# Patient Record
Sex: Female | Born: 1950 | Race: White | Hispanic: No | Marital: Married | Smoking: Never smoker
Health system: Southern US, Community
[De-identification: ages and names within clinical notes are randomized; demographics above are authoritative.]

## PROBLEM LIST (undated history)

## (undated) DIAGNOSIS — E785 Hyperlipidemia, unspecified: Secondary | ICD-10-CM

## (undated) DIAGNOSIS — K3189 Other diseases of stomach and duodenum: Secondary | ICD-10-CM

## (undated) DIAGNOSIS — E538 Deficiency of other specified B group vitamins: Principal | ICD-10-CM

## (undated) DIAGNOSIS — K5730 Diverticulosis of large intestine without perforation or abscess without bleeding: Secondary | ICD-10-CM

## (undated) DIAGNOSIS — K449 Diaphragmatic hernia without obstruction or gangrene: Secondary | ICD-10-CM

## (undated) DIAGNOSIS — K2270 Barrett's esophagus without dysplasia: Secondary | ICD-10-CM

## (undated) DIAGNOSIS — E119 Type 2 diabetes mellitus without complications: Secondary | ICD-10-CM

## (undated) DIAGNOSIS — I8500 Esophageal varices without bleeding: Secondary | ICD-10-CM

## (undated) DIAGNOSIS — D509 Iron deficiency anemia, unspecified: Secondary | ICD-10-CM

## (undated) DIAGNOSIS — K766 Portal hypertension: Secondary | ICD-10-CM

## (undated) DIAGNOSIS — K649 Unspecified hemorrhoids: Secondary | ICD-10-CM

## (undated) DIAGNOSIS — K7460 Unspecified cirrhosis of liver: Secondary | ICD-10-CM

## (undated) DIAGNOSIS — K219 Gastro-esophageal reflux disease without esophagitis: Secondary | ICD-10-CM

## (undated) DIAGNOSIS — I10 Essential (primary) hypertension: Secondary | ICD-10-CM

## (undated) DIAGNOSIS — D369 Benign neoplasm, unspecified site: Secondary | ICD-10-CM

## (undated) HISTORY — DX: Essential (primary) hypertension: I10

## (undated) HISTORY — DX: Benign neoplasm, unspecified site: D36.9

## (undated) HISTORY — DX: Portal hypertension: K76.6

## (undated) HISTORY — DX: Gastro-esophageal reflux disease without esophagitis: K21.9

## (undated) HISTORY — DX: Unspecified hemorrhoids: K64.9

## (undated) HISTORY — DX: Esophageal varices without bleeding: I85.00

## (undated) HISTORY — DX: Hyperlipidemia, unspecified: E78.5

## (undated) HISTORY — DX: Unspecified cirrhosis of liver: K74.60

## (undated) HISTORY — PX: ABDOMINAL HYSTERECTOMY: SHX81

## (undated) HISTORY — DX: Type 2 diabetes mellitus without complications: E11.9

## (undated) HISTORY — DX: Iron deficiency anemia, unspecified: D50.9

## (undated) HISTORY — DX: Diaphragmatic hernia without obstruction or gangrene: K44.9

## (undated) HISTORY — DX: Other diseases of stomach and duodenum: K31.89

## (undated) HISTORY — DX: Deficiency of other specified B group vitamins: E53.8

## (undated) HISTORY — DX: Barrett's esophagus without dysplasia: K22.70

## (undated) HISTORY — DX: Diverticulosis of large intestine without perforation or abscess without bleeding: K57.30

---

## 1998-12-04 ENCOUNTER — Other Ambulatory Visit: Admission: RE | Admit: 1998-12-04 | Discharge: 1998-12-04 | Payer: Self-pay | Admitting: Obstetrics & Gynecology

## 2000-02-13 ENCOUNTER — Other Ambulatory Visit: Admission: RE | Admit: 2000-02-13 | Discharge: 2000-02-13 | Payer: Self-pay | Admitting: Obstetrics and Gynecology

## 2001-04-06 ENCOUNTER — Other Ambulatory Visit: Admission: RE | Admit: 2001-04-06 | Discharge: 2001-04-06 | Payer: Self-pay | Admitting: Obstetrics and Gynecology

## 2002-06-23 ENCOUNTER — Other Ambulatory Visit: Admission: RE | Admit: 2002-06-23 | Discharge: 2002-06-23 | Payer: Self-pay | Admitting: Obstetrics and Gynecology

## 2002-07-15 HISTORY — PX: COLONOSCOPY: SHX174

## 2002-08-01 ENCOUNTER — Ambulatory Visit (HOSPITAL_COMMUNITY): Admission: RE | Admit: 2002-08-01 | Discharge: 2002-08-01 | Payer: Self-pay | Admitting: Internal Medicine

## 2006-03-10 ENCOUNTER — Ambulatory Visit: Payer: Self-pay | Admitting: Internal Medicine

## 2006-03-14 HISTORY — PX: ESOPHAGOGASTRODUODENOSCOPY: SHX1529

## 2006-03-14 HISTORY — PX: COLONOSCOPY: SHX174

## 2006-04-12 ENCOUNTER — Ambulatory Visit (HOSPITAL_COMMUNITY): Admission: RE | Admit: 2006-04-12 | Discharge: 2006-04-12 | Payer: Self-pay | Admitting: Internal Medicine

## 2006-04-12 ENCOUNTER — Ambulatory Visit: Payer: Self-pay | Admitting: Internal Medicine

## 2006-04-12 ENCOUNTER — Encounter (INDEPENDENT_AMBULATORY_CARE_PROVIDER_SITE_OTHER): Payer: Self-pay | Admitting: *Deleted

## 2006-08-14 HISTORY — PX: ESOPHAGOGASTRODUODENOSCOPY: SHX1529

## 2006-08-20 ENCOUNTER — Encounter (INDEPENDENT_AMBULATORY_CARE_PROVIDER_SITE_OTHER): Payer: Self-pay | Admitting: *Deleted

## 2006-08-20 ENCOUNTER — Ambulatory Visit: Payer: Self-pay | Admitting: Internal Medicine

## 2006-08-20 ENCOUNTER — Ambulatory Visit (HOSPITAL_COMMUNITY): Admission: RE | Admit: 2006-08-20 | Discharge: 2006-08-20 | Payer: Self-pay | Admitting: Internal Medicine

## 2006-09-14 HISTORY — PX: HEMORRHOID SURGERY: SHX153

## 2009-08-30 ENCOUNTER — Ambulatory Visit: Payer: Self-pay | Admitting: Internal Medicine

## 2009-08-30 DIAGNOSIS — E782 Mixed hyperlipidemia: Secondary | ICD-10-CM | POA: Insufficient documentation

## 2009-08-30 DIAGNOSIS — E119 Type 2 diabetes mellitus without complications: Secondary | ICD-10-CM | POA: Insufficient documentation

## 2009-08-30 DIAGNOSIS — I10 Essential (primary) hypertension: Secondary | ICD-10-CM | POA: Insufficient documentation

## 2009-08-30 DIAGNOSIS — K649 Unspecified hemorrhoids: Secondary | ICD-10-CM | POA: Insufficient documentation

## 2009-08-30 DIAGNOSIS — IMO0002 Reserved for concepts with insufficient information to code with codable children: Secondary | ICD-10-CM | POA: Insufficient documentation

## 2009-08-30 DIAGNOSIS — K219 Gastro-esophageal reflux disease without esophagitis: Secondary | ICD-10-CM | POA: Insufficient documentation

## 2009-08-30 DIAGNOSIS — D509 Iron deficiency anemia, unspecified: Secondary | ICD-10-CM | POA: Insufficient documentation

## 2009-08-30 DIAGNOSIS — E785 Hyperlipidemia, unspecified: Secondary | ICD-10-CM | POA: Insufficient documentation

## 2009-08-30 DIAGNOSIS — K2270 Barrett's esophagus without dysplasia: Secondary | ICD-10-CM | POA: Insufficient documentation

## 2009-09-04 ENCOUNTER — Encounter: Payer: Self-pay | Admitting: Internal Medicine

## 2009-09-23 ENCOUNTER — Ambulatory Visit (HOSPITAL_COMMUNITY): Admission: RE | Admit: 2009-09-23 | Discharge: 2009-09-23 | Payer: Self-pay | Admitting: Internal Medicine

## 2009-09-23 ENCOUNTER — Ambulatory Visit: Payer: Self-pay | Admitting: Internal Medicine

## 2009-09-23 HISTORY — PX: COLONOSCOPY: SHX174

## 2009-09-23 HISTORY — PX: ESOPHAGOGASTRODUODENOSCOPY: SHX1529

## 2009-09-25 ENCOUNTER — Encounter: Payer: Self-pay | Admitting: Internal Medicine

## 2009-09-27 ENCOUNTER — Ambulatory Visit (HOSPITAL_COMMUNITY): Admission: RE | Admit: 2009-09-27 | Discharge: 2009-09-27 | Payer: Self-pay | Admitting: Internal Medicine

## 2009-10-01 ENCOUNTER — Encounter (INDEPENDENT_AMBULATORY_CARE_PROVIDER_SITE_OTHER): Payer: Self-pay

## 2009-10-07 ENCOUNTER — Ambulatory Visit (HOSPITAL_COMMUNITY): Admission: RE | Admit: 2009-10-07 | Discharge: 2009-10-07 | Payer: Self-pay | Admitting: Internal Medicine

## 2009-10-07 ENCOUNTER — Encounter: Payer: Self-pay | Admitting: Internal Medicine

## 2009-10-11 ENCOUNTER — Ambulatory Visit (HOSPITAL_COMMUNITY): Admission: RE | Admit: 2009-10-11 | Discharge: 2009-10-11 | Payer: Self-pay | Admitting: Internal Medicine

## 2009-10-14 ENCOUNTER — Telehealth (INDEPENDENT_AMBULATORY_CARE_PROVIDER_SITE_OTHER): Payer: Self-pay

## 2009-10-15 HISTORY — PX: OTHER SURGICAL HISTORY: SHX169

## 2009-10-16 ENCOUNTER — Encounter: Payer: Self-pay | Admitting: Internal Medicine

## 2009-10-23 ENCOUNTER — Ambulatory Visit: Payer: Self-pay | Admitting: Internal Medicine

## 2009-10-23 DIAGNOSIS — K7460 Unspecified cirrhosis of liver: Secondary | ICD-10-CM | POA: Insufficient documentation

## 2009-10-29 ENCOUNTER — Ambulatory Visit: Payer: Self-pay | Admitting: Internal Medicine

## 2009-10-29 ENCOUNTER — Ambulatory Visit (HOSPITAL_COMMUNITY): Admission: RE | Admit: 2009-10-29 | Discharge: 2009-10-29 | Payer: Self-pay | Admitting: Internal Medicine

## 2009-10-30 ENCOUNTER — Encounter: Payer: Self-pay | Admitting: Internal Medicine

## 2010-10-14 NOTE — Letter (Signed)
Summary: Patient Notice, Colon Biopsy Results  [facility]  [address]   [place], [place] [address]   Phone: [phone]  Fax: [phone]       September 25, 2009   Alisha [name] [place], [place]  [address] [birth year]    Dear [name],  I am pleased to inform you that the biopsies taken during your recent colonoscopy did not show any evidence of cancer upon pathologic examination.  Additional information/recommendations:  You should have a repeat colonoscopy examination  in 5 years.  Please call us if you are having persistent problems or have questions about your condition that have not been fully answered at this time.  Sincerely,    R. Garfield Cornea MD  [facility] Ph: [phone]    Fax: [phone]   Appended Document: Patient Notice, Colon Biopsy Results mailed pt letter

## 2010-10-14 NOTE — Miscellaneous (Signed)
Summary: labs from [facility]  Clinical Lists Changes L-Alpha-Fetoprotein (AFP), Tumor Marker - STATUS: Final                                            Perform Date: 94WNI62 09:00  Ordered By: Gala Romney MD , Cristopher Estimable           Ordered Date: 10Jan11 08:51                                       Last Updated Date: 70JJK09 23:13  Facility: [facility]                               Department: GENL  Accession #: [id number]                   USN:       [id number]  Findings  Result Name                              Result     Abnl   Normal Range     Units      Perf. Loc.  Alpha Fetoprotein-Tumor Marker        2.5               0.0-8.0          ng/mL    Oversized comment, see footnote  1  Footnotes  1. (NOTE)     The Advia Centaur AFP immunoassay method is used.  Results obtained     with different assay methods or kits cannot be used interchangeably.     AFP is a valuable aid in the management of nonseminomatous testicular     cancer patients when used in conjunction with information available     from the clinical evaluation and other diagnostic procedures.     Increased serum AFP concentrations have also been observed in ataxia     telangiectasia, hereditary tyrosinemia, primary hepatocellular     carcinoma, teratocarcinoma, gastrointestinal tract cancers with and     without liver metastases, and in benign hepatic conditions such as     acute viral hepatitis, chronic active hepatitis, and cirrhosis.  This     result cannot be interpreted as absolute evidence of the presence or     absence of malignant disease.  This result is not interpretable in     pregnant females.     Performed at [facility] RESULT STATUS : F  External IF Update Timestamp : 2009-09-23:23:10:00.000000    L-Hepatitis C Antibody - STATUS: Final                                            Perform Date: 85IDP82 09:00  Ordered By: Gala Romney MD , Cristopher Estimable           Ordered Date: [place]  08:47  Last Updated Date: 10Jan11 21:49  Facility: [facility]                               Department: GENL  Accession #: [id number]                  USN:       [id number]  Findings  Result Name                              Result     Abnl   Normal Range     Units      Perf. Loc.  Hepatitis C Antibody                     NEGATIVE          NEG    Performed at [facility]  Additional Information  HL7 RESULT STATUS : F  External IF Update Timestamp : 2009-09-23:21:46:00.000000   L-Hepatitis B Surface Antigen - STATUS: Final                                            Perform Date: 44IHK74 09:00  Ordered By: Gala Romney MD , Cristopher Estimable           Ordered Date: 10Jan11 08:47                                       Last Updated Date: 25ZDG38 21:49  Facility: [facility]                               Department: GENL  Accession #: [id number]                   USN:       [id number]  Findings  Result Name                              Result     Abnl   Normal Range     Units      Perf. Loc.  Hepatitis B Surface Antigen              NEGATIVE          NEG    Performed at [facility]  Additional Information  HL7 RESULT STATUS : F  External IF Update Timestamp : 2009-09-23:21:45:00.000000   L-Hepatic Function Panel (HFP / LFT) - STATUS: Final                                            Perform Date: 23FTD32 09:00  Ordered By: Gala Romney MD , Cristopher Estimable           Ordered Date: 10Jan11 08:46                                       Last Updated Date:  69VXY80 09:39  Facility: [facility]                               Department: [place]  Accession #: [id number]                    USN:       [id number]  Additional Information  HL7 RESULT STATUS : F  External IF Update Timestamp : 2009-09-23:09:36:00.000000  Result Name                              Result     Abnl   Normal Range     Units      Perf. Loc.  Bilirubin, Total                                 1.7        h      0.3-1.2          mg/dL  Bilirubin, Direct                              0.2               0.0-0.3          mg/dL  Indirect Bilirubin                             1.5        h      0.3-0.9          mg/dL  Alkaline Phosphatase                     59                39-117           U/L  SGOT (AST)                                  24                0-37             U/L  SGPT (ALT)                                   20                0-35             U/L  Total  Protein                                6.6               6.0-8.3          g/dL  Albumin-Blood                              3.7  3.5-5.2          g/dL  Additional Information  HL7 RESULT STATUS : F  External IF Update Timestamp : 2009-09-23:09:36:00.000000   L-Creatinine-Blood - STATUS: Final                                            Perform Date: 51WCH85 09:00  Ordered By: Gala Romney MD , Cristopher Estimable           Ordered Date: 10Jan11 08:51                                       Last Updated Date: 10Jan11 09:39  Facility: [facility]                               Department: GENL  Accession #: [id number]                  USN:       [id number]  Findings  Result Name                              Result     Abnl   Normal Range     Units      Perf. Loc.  Creatinine                                     0.76              0.4-1.2          mg/dL  GFR, Est Non African American       >60               >60              mL/min  GFR, Est African American             >60               >60              mL/min    Oversized comment, see footnote  1  Footnotes  1. The eGFR has been calculated     using the MDRD equation.     This calculation has not been     validated in all clinical     situations.     eGFR's persistently     <60 mL/min signify     possible Chronic Kidney Disease.  Additional Information  HL7 RESULT STATUS : F  External IF Update Timestamp : 2009-09-23:09:36:00.000000    L-PT  (Prothrombin Time or Protime) w/INR - STATUS: Final                                            Perform Date: 09TOI71 09:00  Ordered By: Gala Romney MD , Cristopher Estimable           Ordered Date: 10Jan11 08:46  Last Updated Date: 10Jan11 09:27  Facility: [facility]                               Department: GENL  Accession #: [id number]                     USN:       [id number]  Findings  Result Name                              Result     Abnl   Normal Range     Units      Perf. Loc.  Protime ( Prothrombin Time)          15.8       h      11.6-15.2        seconds  INR                                               1.27              0.00-1.49  Additional Information  HL7 RESULT STATUS : F  External IF Update Timestamp : 2009-09-23:09:24:00.000000  Appended Document: labs from [facility] cirrhosis on CT and spot in lung for which ct of chest needed asap; labs ok except bilirubin up a little ; no sign of viral hepatitis of liver tumor; she needs a f/u w extender in next 2-3 weeks to inv estigate further.  Need Chest CT ASAP  Appended Document: labs from [facility] Tried to call patient.  Not at home.  Advised to call her around 1:30pm tomorrow.    Appended Document: labs from [facility] Discussed findings with patient.  Answered questions about cirrhosis.  She will call Rosendo Gros back to schedule Chest CT as per RMR order.  See above.  Need OV nonurgent for f/u cirrhosis with Dr. Gala Romney.  Patient request with him.  Appended Document: labs from [facility] Called pt, No answer, LMOM

## 2010-10-14 NOTE — Assessment & Plan Note (Signed)
Summary: FU WITH RMR,CIRHOSIS/SS   Visit Type:  Follow-up Visit Primary Care Provider:  vyas  Chief Complaint:  follow up- doing ok.  History of Present Illness: Pleasant 60 year old lady underwent recent workup for iron deficiency anemia and Hemoccult-positive stool with EGD and colonoscopy. She was found to have esophageal varices. History of Barrett's esophagus biopsies/  negative for dysplasia most recently. CT Scan demonstrated intra-abdominal varices and cirrhotic-appearing liver with a large spleen. No evidence of a hepatic mass although she did a middle lobe lung lesion which was concerning. PET Scan negative. No tobacco history. No alcohol history. No prior history of chronic liver disease or cirrhosis. Long-standing iron deficiency anemia going back a good 20 years originally diagnosed by Dr. Jerene Pitch.  She is not yet been screened for celiac disease. She has not had her small bowel imaged. She needs further workup as to the etiology of her cirrhosis.  Clinically, she has not had any GI bleeding aside from a positive Hemoccult obtained during her physical with her gynecologist.  Current Problems (verified): 1)  Cirrhosis  (ICD-571.5) 2)  Gerd  (ICD-530.81) 3)  Anemia, Iron Deficiency, Chronic  (ICD-280.9) 4)  Barretts Esophagus  (ICD-530.85) 5)  Diabetes Mellitus, Type II  (ICD-250.00) 6)  Hemorrhoids  (ICD-455.6) 7)  Hypertension  (ICD-401.9) 8)  Hyperlipidemia  (ICD-272.4)  Current Medications (verified): 1)  Glucophage 1000 Mg Tabs (Metformin Hcl) .... Two Times A Day 2)  Glipizide 10 Mg Tabs (Glipizide) .... Two Times A Day 3)  Nexium 40 Mg Cpdr (Esomeprazole Magnesium) .... Once Daily 4)  Lisinopril 10 Mg Tabs (Lisinopril) .... Once Daily 5)  Premarin 0.3 Mg Tabs (Estrogens Conjugated) .... Every Other Day 6)  Victoza 18 Mg/25m Soln (Liraglutide) .... 1.8 Units Once Daily  Allergies (verified): No Known Drug Allergies  Past History:  Past Surgical History: Last updated:  08/30/2009 HYSTERECTOMY CESAREAN SECTIONS X 2 Hemorrhoidectomy, 2008  Family History: Last updated: 08/30/2009 No FH of CRC, liver dz, chronic GI illnesses.  Social History: Last updated: 08/30/2009 Married. 2 grown children. 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  No tob, alcohol, drug use.  Past Medical History: Anemia, chronic IDA  Barretts Esophagus, dx 2003  EGD 12/07, Barrett's esophagus, bx no dysplasia.   EGD 7/07, Barrett's esophagus, bx focal atypia c/w low grade dysplasia, SB bx negative for Celiac. TCS 7/07, left-sided diverticula, splenic flexure tubular adenoma (630m. 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 Diabetes Hyperlipidemia Hypertension Cirrhosis  Vital Signs:  Patient profile:   [age]ear old female Height:      61 inches Weight:      184 pounds BMI:     34.89 Temp:     97.7 degrees F oral Pulse rate:   88 / minute BP sitting:   128 / 78  (right arm) Cuff size:   regular  Vitals Entered By: JuBurnadette PeterPN (February  9, 209629:30 PM)  Physical Exam  General:  alert conversant lady in no acute distress. She is accompanied by her husband.  She does have somewhat of a sallow appearance. Eyes:  there is no scleral icterus. Conjunctiva are pink Lungs:  clear to auscultation Heart:  regular rate and rhythm without murmur gallop rub Abdomen:  obese positive bowel sounds soft nontender no shifting dullness or fluid wave. I do believe I can blotthert  spleen; liver edge is indistinct at the right costal margin  Impression & Recommendations: Impression: Long-standing iron deficiency anemia/ Hemoccult positive in a  setting of cirrhosis.  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 However,  other possible etiologies need to be explored.  I certainly doubt iron overload. She needs imaging of her small bowel to wrap the evaluation of the IDA and Hemoccult-positive stool.  Recommendations: Video capsule study of  the small bowel. Risks, benefits, limitations reviewed with patient and her husband. They are agreeable. Will check a serum Celiac panel.  we'll go ahead and do a serological workup for liver disease. We'll also obtain autoimmune markers and a serum ceruloplasmin and a baseline serum alpha-fetoprotein. I briefly discussed the possibility of evaluation at a transplant center later in the year.  She will likely need  vaccination against hepatitis A and B. Further recommendations to follow  Problem # 2:  I briefly  Other Orders: T-CBC w/Diff (55217-47159) T-PT (Prothrombin Time) (22000) T-AFP Tumor Markers (53967-28979) T-Comprehensive Metabolic Panel (15041-36438) T-Hepatitis A Antibody (37793-96886) T-Hepatitis C Antibody (48472-07218) T-Hepatitis B Surface Antigen (28833-74451) T-Hepatitis B Surface Antibody (46047-99872) T-ANA (15872-76184) T-Anti SMA (85927-63943) T-Ceruloplasmin (20037-94446) T-Celiac Disease Ab Evaluation (8002) T-Immunoglobulins, Quantitative (19012) TLB-Bilirubin, Direct (82248-DBIL)  Appended Document: Orders Update-charge    Clinical Lists Changes  Orders: Added new Service order of Est. Patient Level V (22411) - Signed

## 2010-10-14 NOTE — Letter (Signed)
Summary: Patient Notice, Endo Biopsy Results  [facility]  [address]   [place], [place] [address]   Phone: [phone]  Fax: [phone]       September 25, 2009   STEPHAN DRAUGHN [place], [place]  [address] 1951/03/30    Dear Alisha [name],  I am pleased to inform you that the biopsies taken during your recent endoscopic examination did not show any evidence of cancer upon pathologic examination.  Additional information/recommendations:  Please call [phone] to schedule a return visit to review your condition.  Stay on Nexium daily.  You should have a repeat endoscopic examination in 3 years.  Please call us if you are having persistent problems or have questions about your condition that have not been fully answered at this time.  Sincerely,    R. Garfield Cornea MD  [facility] Ph: [phone]   Fax: [phone]   Appended Document: Patient Notice, Endo Biopsy Results mailed pt letter

## 2010-10-14 NOTE — Letter (Signed)
Summary: CAPSUL STUDY ORDER  CAPSUL STUDY ORDER   Imported By: Sofie Rower 10/23/2009 15:31:27  _____________________________________________________________________  External Attachment:    Type:   Image     Comment:   External Document

## 2010-10-14 NOTE — Letter (Signed)
Summary: PET SCAN ORDER  PET SCAN ORDER   Imported By: Stephan Minister 10/16/2009 12:08:16  _____________________________________________________________________  External Attachment:    Type:   Image     Comment:   External Document

## 2010-10-14 NOTE — Letter (Signed)
Summary: AUTHORIZATION FOR CAPSULE  AUTHORIZATION FOR CAPSULE   Imported By: Sofie Rower 10/30/2009 12:16:59  _____________________________________________________________________  External Attachment:    Type:   Image     Comment:   External Document

## 2010-10-14 NOTE — Letter (Signed)
Summary: CT CHEST ORDER  CT CHEST ORDER   Imported By: Sofie Rower 10/07/2009 12:45:07  _____________________________________________________________________  External Attachment:    Type:   Image     Comment:   External Document

## 2010-10-14 NOTE — Progress Notes (Signed)
Summary: Pt called for results of PET SCAN  Phone Note Call from Patient   Caller: Patient Summary of Call: Pt requests results of PET SCAN. I told her Dr. Gala Romney had not signed off yet, she was very anxious and i read the Impression ver batum, told her we will call when it is signed off on. Initial call taken by: Waldon Merl LPN,  October 14, 7652 1:32 PM     Appended Document: Pt called for results of PET SCAN see append on PET report

## 2010-11-30 LAB — HEPATIC FUNCTION PANEL
AST: 24 U/L (ref 0–37)
Albumin: 3.7 g/dL (ref 3.5–5.2)
Indirect Bilirubin: 1.5 mg/dL — ABNORMAL HIGH (ref 0.3–0.9)
Total Protein: 6.6 g/dL (ref 6.0–8.3)

## 2010-11-30 LAB — AFP TUMOR MARKER: AFP-Tumor Marker: 2.5 ng/mL (ref 0.0–8.0)

## 2010-11-30 LAB — HEPATITIS C ANTIBODY: HCV Ab: NEGATIVE

## 2010-11-30 LAB — GLUCOSE, CAPILLARY: Glucose-Capillary: 127 mg/dL — ABNORMAL HIGH (ref 70–99)

## 2010-11-30 LAB — CREATININE, SERUM: GFR calc non Af Amer: >60 mL/min (ref >60)

## 2011-01-30 NOTE — Op Note (Signed)
NAME:  Alisha [name], Alisha [name]           ACCOUNT NO.:  [id number]   MEDICAL RECORD NO.:  [id number]          PATIENT TYPE:  AMB   LOCATION:  DAY                           FACILITY:  [facility]   PHYSICIAN:  R. Garfield Cornea, M.D. DATE OF BIRTH:  [birth year]   DATE OF PROCEDURE:  08/20/2006  DATE OF DISCHARGE:                               OPERATIVE REPORT   PROCEDURE:  Esophagogastroduodenoscopy with biopsy.   INDICATIONS:  The patient is a 60 year old lady with known Barrett's  esophagus.  She underwent biopsies back in July and had what seemed like  __________  .  She has been on AcipHex 20 mg orally b.i.d. since that  time.  She comes for repeat EGD with surveillance biopsies.  This  approach has been discussed with the patient gently.  The potential  risks, benefits and alternatives have been reviewed.  She is having no  dysphagia or reflux symptoms currently.   DESCRIPTION OF PROCEDURE:  Oxygen saturation, blood pressure, pulse and  respiration were monitored throughout the entire procedure.  Conscious  sedation with Versed 4 mg IV, Demerol 75 mg IV in divided doses.  Cetacaine spray for topical oropharyngeal anesthesia.  The instrument  was the Olympus video chip system.   FINDINGS:  Esophagus:  Examination of the tubular esophagus revealed a 3-  4 cm patch of salmon-colored epithelium coming off above the EG junction  which was semi-lunar and involved approximately one-third of the distal  esophagus.  It may be somewhat more extensive then seen previously.  There is no evidence of neoplasia. There is no esophagitis.  The EG  junction is easily traversed.   Stomach:  The gastric cavity was empty and insufflated well with air.  A  thorough examination of the gastric mucosa, retroflexed in the proximal  stomach and esophagogastric junction demonstrated a small to moderate  size hiatal hernia.  Otherwise the gastric mucosa appeared normal and  patent. Pylorus was patent and easily  traversed.  Examination of the  bulb and second portion revealed no abnormalities.   THERAPY AND DIAGNOSTIC MANEUVERS:  The area of salmon-colored epithelium  was biopsied multiple times and sent to pathology.  The patient  tolerated the procedure well and was reactive after endoscopy.   IMPRESSION:  1. Salmon-colored epithelium consistent with Barrett's esophagus,      biopsied.  2. Moderate size hiatal hernia. left  3. Normal stomach, D1 and D2.   RECOMMENDATIONS:  1. Continue AcipHex.  2. Follow up __________ .  3. Further recommendations to follow.      Bridgette Habermann, M.D.  Electronically Signed     RMR/MEDQ  D:  08/20/2006  T:  08/20/2006  Job:  [id number]   cc:   Jerene Bears  Fax: [phone]

## 2011-01-30 NOTE — Consult Note (Signed)
NAME:  Alisha [name], Alisha [name] NO.:  [id number]   MEDICAL RECORD NO.:  [id number]                  PATIENT TYPE:   LOCATION:                                       FACILITY:   PHYSICIAN:  R. Garfield Cornea, M.D.              DATE OF BIRTH:  07/05/1952   DATE OF CONSULTATION:  07/18/2002  DATE OF DISCHARGE:                                   CONSULTATION   REASON FOR CONSULTATION:  Iron deficiency anemia, gastroesophageal reflux  disease.   HISTORY OF PRESENT ILLNESS:  The patient is a pleasant 60 year old lady with  type 2 diabetes mellitus sent over at the courtesy of Dr. Woody Seller in [place],  [place] to further evaluate marked microcytic anemia.  The patient  tells me she has been anemic most of her life.  Her anemia got better when  she underwent a hysterectomy in 1993 but her hemoglobin never normalized.  She tells me she has been in the hemoglobin range of 11 or so over the  years, but over the past year hemoglobin has dropped.  She has become  symptomatic with fatigue.  According to Dr. Marcial Pacas records, recent  hemoglobin was 9.7, MCV 66.8.  In fact, the patient was rejected as a [employer] blood donor recently.  However, it is notable that she gives  regularly, on the average of one donation every eight weeks or six times a  year, and she gave blood regularly through the first half of this year.  She  has not had any melena or rectal bleeding.  She saw her gynecologist, Dr.  Paula Compton, down in [place] in September.  She was hemoccult  negative.  There is no family history of colorectal neoplasia.  She does  have a history of gastroesophageal reflux disease and probably has hiatal  hernia based on upper GI series.  She is on Aciphex 20 mg orally daily for  the past couple of years.  She does not have any diarrhea.  She does have  occasional bloating.  She does crave ice.  She occasionally uses Advil for  aches and pains.  She does not use  alcohol.   PAST MEDICAL HISTORY:  Significant for type 2 diabetes mellitus, hormone  replacement therapy, gastroesophageal reflux disease.   PAST SURGICAL HISTORY:  C section x2, hysterectomy.   CURRENT MEDICATIONS:  1. Glucophage 1 g b.i.d.  2. Glucotrol 10 mg b.i.d.  3. Aciphex 20 mg daily.  4. Premarin 0.625 mg daily.   ALLERGIES:  No known drug allergies.   FAMILY HISTORY:  Mother with osteoporosis.  Father died with Alzheimer's.  No history of chronic GI or liver illness.   SOCIAL HISTORY:  The patient has been married for 19 years, two children.  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  No tobacco or alcohol.   REVIEW OF SYSTEMS:  No chest pain,  no dyspnea on exertion, no night sweats,  fevers, or change in weight.   PHYSICAL EXAMINATION:  GENERAL:  Reveals a pleasant 60 year old lady who is  obese.  VITAL SIGNS:  Weight 211, height 5 feet 1 inch.  Temperature 97.7, blood  pressure 122/72, pulse 96.  SKIN:  Warm and dry.  HEENT:  No sclerae icterus.  JVD is not prominent.  CHEST:  Lungs are clear to auscultation.  CARDIAC:  Regular rate and rhythm without murmur, gallop, or rub.  ABDOMEN:  Nondistended, positive bowel sounds.  Soft and nondistended  without any obvious mass or organomegaly.  EXTREMITIES:  No edema.   IMPRESSION:  The patient is a pleasant 60 year old lady who presents with a  marked microcytic anemia.  She has been a regular, frequent blood donor for  the past couple of years.  She is reportedly hemoccult negative.  She has  had well controlled GERD.   With her ice cravings. she has pica.   RECOMMENDATIONS:  I agree with Dr. Woody Seller she really needs to have a  colonoscopy for diagnostic/screening purposes.  At the same time we can go  ahead and offer this nice lady an EGD as well.  She may have microcytic  anemia on the basis of a depleted iron stores from frequent phlebotomies; a   lesion-producing slow intermittent GI bleed is also possible, as is a  malabsorptive process.  I have discussed both EGD and colonoscopy with the  patient and potential risks, benefits, and alternatives have been reviewed.  Since she is low risk for conscious sedation, ASA 2, we will plan to perform  endoscopic evaluation in the near future at [facility].   I would like to thank Dr. Woody Seller for allowing me to see this nice lady.  Further recommendations to follow.                                               Bridgette Habermann, M.D.    RMR/MEDQ  D:  07/18/2002  T:  07/18/2002  Job:  [id number]   cc:   Jerene Bears  [address]  [place]  [place] [address]  Fax: [phone]

## 2011-01-30 NOTE — Op Note (Signed)
NAME:  Alisha [name], Alisha [name]                       ACCOUNT NO.:  [id number]   MEDICAL RECORD NO.:  [id number]                   PATIENT TYPE:  AMB   LOCATION:  DAY                                  FACILITY:  [facility]   PHYSICIAN:  R. Garfield Cornea, M.D.              DATE OF BIRTH:  1950-11-22   DATE OF PROCEDURE:  08/01/2002  DATE OF DISCHARGE:                                 OPERATIVE REPORT   PROCEDURE:  Esophagogastroduodenoscopy with biopsy followed by a colonoscopy  and snare polypectomy.   INDICATIONS FOR PROCEDURE:  The patient is a 60 year old type 2 diabetic  with a longstanding microcytic anemia.  She is Hemoccult negative.  EGD and  colonoscopy are now being done to further evaluate her symptoms.  This  approach has been discussed with the patient.  Potential risks, benefits,  and alternatives have been reviewed and questions answered.  ASA 2.   PROCEDURE NOTE:  O2 saturation, blood pressure, pulse, respirations were  monitored throughout the entire and both procedure.   CONSCIOUS SEDATION FOR BOTH PROCEDURES:  Versed 4 mg IV, Demerol 75 mg IV.  Cetacaine spray for topical oropharyngeal anesthesia.   INSTRUMENT USED:  Olympus video chip adult gastroscope and colonoscope.   PROCEDURE #1, EDG FINDINGS:  Examination of the tube and esophagus involving  approximately one-third of the distal esophageal mucosa examined.  Columnar  epithelium coming up approximately 4 cm from the EG junction.  Please see  photos.  Esophageal mucosa otherwise, appeared normal.  EG junction easily  traversed.   Stomach:  Gastric cavity was emptied and insufflated well with air.  Thorough examination of the gastric mucosa including retroflex view of the  proximal stomach, esophagogastric junction demonstrated only a small hiatal  hernia.  The pylorus was patent and easily traversed.  Examination of D1,  D2, and D3 demonstrated no abnormalities.   THERAPY/DIAGNOSTIC MANEUVERS PERFORMED:  D2 and D3  were biopsied for  histological study, rule out celiac disease.  The scope was pulled back into  the distal esophagus and examination of the columnar epithelium, as  described above, was biopsied as well.  The patient tolerated the procedure  well, was prepared for colonoscopy.   PROCEDURE #2, COLONOSCOPY FINDINGS:  Digital rectal exam revealed no  abnormalities.   ENDOSCOPIC FINDINGS:  The prep was good.   Rectal:  Examination of the rectal mucosa including retroflex view of the  anal verge revealed a 1.5 cm pedunculated polyp at 4 cm in from the anal  verge, please see photos.  The remainder of the rectal mucosa appeared  normal.   Colon:  Colonic mucosa was surveyed from the rectosigmoid junction through  the left, transverse, and  right colon to the appendiceal orifice, ileocecal  valve, and the cecum.  These structures are well seen and photographed for  the record.  The patient was noted to have scattered left-sided diverticula.  remainder of the colonic mucosa  appeared normal from the level of the cecum  to the ileocecal valve.  The scope was slowly withdrawn and all previous  mentioned mucosal surfaces were again seen and no abnormalities were  observed.  The polyp in the rectum was removed completely with two passes of  the snare.  Both fragments were recovered.  The patient tolerated both  procedures well and was reactive.   ENDOSCOPY IMPRESSION:  1. Esophagogastroduodenoscopy exam:  Everything in the distal esophagus     consistent with at least short-segment Barrett's esophagus, biopsied.     The remainder of the esophageal mucosa appeared normal.  2. Small hiatal hernia.  Otherwise, normal stomach.  3. Normal D1, D2, and D3.  Biopsies of small bowel taken.   COLONOSCOPY IMPRESSION:  1. A pedunculated polyp in the rectum, removed with snare.  Otherwise,     normal rectum.  2. Left-sided diverticula.  The remainder of the colonic mucosa appeared     normal.    RECOMMENDATIONS:  1. No aspirin or toxic medications for 10 days.  2. Colace 100 mg twice daily for five days to keep the stool soft.  3. Continue Aciphex 20 mg orally daily.  4. Further recommendations to follow.                                               Bridgette Habermann, M.D.    RMR/MEDQ  D:  08/01/2002  T:  08/01/2002  Job:  [id number]   cc:   Jerene Bears  [address]  [place]  [place] [address]  Fax: [phone]

## 2011-01-30 NOTE — Op Note (Signed)
NAME:  Alisha [name], Alisha [name]           ACCOUNT NO.:  [id number]   MEDICAL RECORD NO.:  [id number]          PATIENT TYPE:  AMB   LOCATION:  DAY                           FACILITY:  [facility]   PHYSICIAN:  R. Garfield Cornea, M.D. DATE OF BIRTH:  1951-01-01   DATE OF PROCEDURE:  04/12/2006  DATE OF DISCHARGE:                                 OPERATIVE REPORT   PROCEDURE PERFORMED:  Esophagogastroduodenoscopy with esophageal biopsy and  small bowel biopsy followed by colonoscopy and snare polypectomy.   INDICATIONS FOR PROCEDURE:  The patient is a 60 year old lady with  gastroesophageal reflux disease and known history of Barrett's esophagus,  biopsy proven, here for surveillance.  She is having no upper  gastrointestinal tract symptoms.  Reflux symptoms are well controlled on  Aciphex.  History of tubular villous adenoma removed from her rectum  previously here.  She is here for surveillance and she has no lower GI tract  symptoms.  This approach has been discussed with the patient at length,  potential risks, benefits and alternatives have been reviewed.  Please see  the two H&P's on the chart.   The patient has a history of diabetes and iron deficiency anemia.  She is  status post hysterectomy.   Oxygen saturations, blood pressure, pulse and respirations were monitored  throughout the entirety of both procedures.   CONSCIOUS SEDATION:  Versed 4 mg IV, Demerol 100 mg IV in divided doses.   INSTRUMENT USED:  Olympus video chip system.   FINDINGS:  EG examination of the tubular esophagus revealed a short segment  of Barrett's esophagus.  Esophagogastric junction was identified with a  tongue of salmon-colored epithelium coming up no more than 2 cm from the  esophagogastric junction.  There was also a 5 mm island of salmon-colored  epithelium adjacent to this but separated by intervening pearly gray normal-  appearing esophageal mucosa, please see photos.  There was no esophagitis,  no  evidence of chamber.  Esophagogastric junction easily traversed.   Stomach:  The gastric cavity was empty and insufflated well with air.  Thorough examination of the gastric mucosa including retroflex view of the  proximal stomach, esophagogastric junction demonstrated only small to  moderate sized hiatal hernia.  Gastric mucosa appeared entirely normal.  Pylorus was patent.  Examination of the bulb, second and third portion  revealed no abnormalities.   THERAPY/DIAGNOSTIC MANEUVERS PERFORMED:  The Barrett's esophagus was  biopsied for histologic study. The second and third portion of the duodenum  were biopsied for histologic study.  The patient tolerated the procedure  well and was prepared for colonoscopy.   Digital rectal exam revealed no abnormalities.   ENDOSCOPIC FINDINGS:  Prep was good.   Rectum:  Examination of the rectal mucosa including retroflex view of the  anal verge revealed no abnormalities.   Colon:  Colonic mucosa was surveyed from the rectosigmoid junction to the  left, transverse and right colon to the area of appendiceal orifice and  ileocecal valve and cecum.  These structures were well seen and photographed  for the record.  From this level, the scope was slowly withdrawn.  All  previously mentioned mucosal surfaces were again seen.  The patient was  noted to have sigmoid diverticula and there was a 6 mm pedunculated polyp at  the splenic flexure.  This polyp was resected with hot snare, recovered  through the scope.  Remainder of colonic mucosa appeared normal.  The  patient tolerated the procedure well and was reacted in endoscopy.   IMPRESSION:  1.  Short segment Barrett's esophagus with island of Barrett's epithelium.      Otherwise normal esophagus, status post biopsy.  2.  Small to moderate-sized hiatal hernia, otherwise normal stomach, normal      D1 through D3.  Biopsies of D2 and D3 taken.   COLONOSCOPY FINDINGS:  Normal rectum, left-sided  diverticula.  Polyp at  splenic flexure resected with snare.  Remainder of colonic mucosa appeared  normal.   RECOMMENDATIONS:  1.  Follow up on pathology.  2.  Further recommendations to follow.      Bridgette Habermann, M.D.  Electronically Signed     RMR/MEDQ  D:  04/12/2006  T:  04/12/2006  Job:  [id number]   cc:   Jerene Bears  Fax: [phone]

## 2011-01-30 NOTE — H&P (Signed)
NAME:  ARVILLA, SALADA           ACCOUNT NO.:  [id number]   MEDICAL RECORD NO.:  [id number]           PATIENT TYPE:   LOCATION:                                 FACILITY:   PHYSICIAN:  R. Garfield Cornea, M.D. DATE OF BIRTH:  1950-10-10   DATE OF ADMISSION:  03/10/2006  DATE OF DISCHARGE:  LH                                HISTORY & PHYSICAL   REASON FOR CONSULTATION:  Followup Barrett's and history of adenomatous  polyps.   HISTORY OF PRESENT ILLNESS:  Ms. Dietzman is a 60 year old female patient of  Dr. Woody Seller. She has a history of chronic microcytic/iron deficiency anemia.  She underwent colonoscopy by Dr. Gala Romney on August 01, 2002. She was found  to have a villous tubular adenomatous polyp in the rectum, which is 1.4 x 8  x 5 mm and another, which is 1.5 x 1 x 1 cm. She also had an EGD at the same  time, as she has history of anemia. She was found to have Barrett's  esophagus, which was biopsy proven.   Today, she states that she has been doing well. Her hemoglobin was around 10  at Dr. Woody Seller office the first of the month. I do not have report of this.  Apparently, she started iron the second week in June. Now her hemoglobin is  up to 11.5 at last check, per her report. She has not noticed any rectal  bleeding or melena. She has daily soft, brown bowel movement. Denies any  nausea, vomiting, heartburn, indigestion, anorexia, early satiety,  dysphagia, or odynophagia. She is on Aciphex 20 mg daily and doing well. She  denies any chest pain, shortness of breath, or palpitations. She does  complain of some mild intermittent fatigue.   PAST MEDICAL HISTORY:  Hyperlipidemia, hypertension, hemorrhoid disease,  type 2 diabetes mellitus, Barrett's esophagus with last EGD and colonoscopy  as described in HPI. History of iron deficiency anemia, complete  hysterectomy, and two cesarean sections.   MEDICATIONS:  1.  Glucophage 1 gram b.i.d.  2.  Glucotrol 10 mg b.i.d.  3.  Aciphex 20 mg  daily.  4.  Premarin 0.625 mg daily.  5.  Iron 325 mg daily.  6.  Lisinopril 10 mg daily.   ALLERGIES:  NO KNOWN DRUG ALLERGIES.   FAMILY HISTORY:  There is no known family history of colorectal carcinoma or  other GI problems. Mother is alive at age 54 with dementia. Father deceased  secondary to coronary artery disease at age 39. She has 3 healthy sisters  and 3 healthy brothers.   SOCIAL HISTORY:  Mrs. Roskelley has been married for 23 years. She has 2  college age healthy children. 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 She denies any tobacco, alcohol, or  drug use.   REVIEW OF SYSTEMS:  CONSTITUTIONAL:  Denies any nausea, fever, or chills.  CARDIOVASCULAR:  Denies chest pain or palpitations. PULMONARY:  Denies  shortness of breath, dyspnea, cough, or hemoptysis. GASTROINTESTINAL:  See  HPI. HEMATOLOGICAL:  Denies any history of epistaxis. No easy bruising.  Denies  any blood dyscrasias. ENDOCRINE:  She has had diabetes in good  control. Last hemoglobin A1C was 6.1.   PHYSICAL EXAMINATION:  VITAL SIGNS:  Weight 209 pounds. Height 61 inches.  Temperature 98.2, blood pressure 158/88, and pulse 74.  GENERAL:  Mrs. Mosquera is a 60 year old Caucasian female who is alert,  oriented, pleasant, cooperative, and on no acute distress.  HEENT:  Sclerae clear. Nonicteric. Conjunctivae pink. Oropharynx pink and  moist without any lesions.  NECK:  Supple without any mass or thyromegaly.  HEART:  Regular rate and rhythm with normal S1 and S2. Without murmurs,  rubs, clicks, or gallops.  LUNGS:  Clear to auscultation bilaterally.  ABDOMEN:  Protuberant with positive bowel sounds x4. The patient is obese.  Abdomen is soft, nontender, and nondistended without probable mass or  hepatosplenomegaly. No rebound, tenderness, or guarding.  RECTAL:  Deferred.  EXTREMITIES:  With 1+ pitting edema bilaterally. No clubbing.   IMPRESSION:  Mrs. Rutha Bouchard is a  61 year old Caucasian female with a history  of iron deficiency anemia. Per her report, last hemoglobin was 11.5. She has  been on iron for about 3 weeks now. She has a history of Barrett's  esophagus. Is due to surveillance and a history of colonic adenomatous  polyps. We will proceed with both examinations.   PLAN:  Colonoscopy and EGD with Dr. Gala Romney in the near future. I discussed  both procedures, impending risks and benefits, including but not limited to  infection, perforation, and drug reactions. She agrees and informed consent  will be obtained. She is to hold her iron 7 days prior to procedure. She is  to take half of her Glucophage and Glucotrol the day of and prior to the  procedure.      Les Pou, N.P.      Bridgette Habermann, M.D.  Electronically Signed    KC/MEDQ  D:  03/10/2006  T:  03/10/2006  Job:  [id number]   cc:   Jerene Bears  Fax: [phone]

## 2012-03-15 ENCOUNTER — Encounter: Payer: Self-pay | Admitting: Internal Medicine

## 2012-03-15 DIAGNOSIS — D649 Anemia, unspecified: Secondary | ICD-10-CM

## 2012-04-19 ENCOUNTER — Encounter: Payer: Self-pay | Admitting: Internal Medicine

## 2012-04-19 ENCOUNTER — Ambulatory Visit (INDEPENDENT_AMBULATORY_CARE_PROVIDER_SITE_OTHER): Payer: BC Managed Care – PPO | Admitting: Internal Medicine

## 2012-04-19 VITALS — BP 140/71 | HR 100 | Temp 97.4°F | Ht 61.0 in | Wt 189.8 lb

## 2012-04-19 DIAGNOSIS — D126 Benign neoplasm of colon, unspecified: Secondary | ICD-10-CM

## 2012-04-19 DIAGNOSIS — K2270 Barrett's esophagus without dysplasia: Secondary | ICD-10-CM

## 2012-04-19 DIAGNOSIS — K7460 Unspecified cirrhosis of liver: Secondary | ICD-10-CM

## 2012-04-19 NOTE — Patient Instructions (Addendum)
Hepatic ultrasound; chest x-ray - f/u on prior abnormality  Afp, cbc, lfts ceruloplasim, ana, antismooth muscle, lfts , INR, met-7  Hep a antibody   Ov 6 weeks   Inderal 20 mg twice daily  EGD 2014  TCS 2016  Weight loss; regular aerobic excercise

## 2012-04-19 NOTE — Progress Notes (Signed)
Primary Care Physician:  Glenda Chroman., MD Primary Gastroenterologist:  Dr.   Pre-Procedure History & Physical: HPI:  Alisha [name] is a 61 y.o. female here in well over 1 year. She is failed to followup with [name]. Extensive workup for iron deficiency anemia failed to yield a cause via a mechanism of GI bleeding. She tells me she saw Dr. Cecelia Byars in [facility] she was transfused he got parenteral iron her hemoglobin is maintaining in the normal range. No hematochezia no melena. No recent evidence of GI bleeding. Has known esophageal varices. Hepatitis B. surface antigen came back negative. Autoimmune markers were ordered but not done. Small bowel biopsy for celiac disease negative. No family history of chronic liver disease. No alcohol exposure. Long-standing insulin-dependent diabetes mellitus. Patient is obese. Abnormal chest lesion followed up with PET scan previously felt to be scarring. No smoking history. Barrett's esophagus on 2011 EGD. Colonic adenoma on 2007 colonoscopy with negative exam 2011. She will be due for surveillance colonoscopy in 2016. Due for surveillance EGD 2014.  Past Medical History  Diagnosis Date  . Cirrhosis of liver without mention of alcohol     hep B surface antigen and HCV ab negative.  . Esophageal reflux   . Iron deficiency anemia, unspecified   . Barrett's esophagus   . Type II or unspecified type diabetes mellitus without mention of complication, not stated as uncontrolled   . Unspecified hemorrhoids without mention of complication   . Unspecified essential hypertension   . Other and unspecified hyperlipidemia   . Tubular adenoma     Past Surgical History  Procedure Date  . Abdominal hysterectomy   . Cesarean section     x 2  . Hemorrhoid surgery 2008  . Esophagogastroduodenoscopy 08/2006    barretts esophagus, no dyplasia  . Esophagogastroduodenoscopy 03/2006    barretts esophagus,bx focal atypia c/w low grade dysplasia, SB bx negative  for celiac  . Colonoscopy 03/2006    left sided diverticula, splenic flexure tublar adenoma  . Colonoscopy 07/2002    villous tubular adenoma in rectum    Prior to Admission medications   Medication Sig Start Date End Date Taking? Authorizing Provider  GLIPIZIDE XL 10 MG 24 hr tablet Take 10 mg by mouth daily.  02/24/12  Yes Historical Provider, MD  lisinopril (PRINIVIL,ZESTRIL) 10 MG tablet Take 10 mg by mouth daily.  04/09/12  Yes Historical Provider, MD  metFORMIN (GLUCOPHAGE) 1000 MG tablet Take 1,000 mg by mouth 2 (two) times daily with a meal.  02/09/12  Yes Historical Provider, MD  NEXIUM 40 MG capsule Take 40 mg by mouth daily before breakfast.  04/14/12  Yes Historical Provider, MD  VICTOZA 18 MG/3ML SOLN  04/15/12  Yes Historical Provider, MD    Allergies as of 04/19/2012 - Review Complete 04/19/2012  Allergen Reaction Noted  . Codeine Rash 04/19/2012    No family history on file.  History   Social History  . Marital Status: Married    Spouse Name: N/A    Number of Children: N/A  . Years of Education: N/A   Occupational History  . Not on file.   Social History Main Topics  . Smoking status: Never Smoker   . Smokeless tobacco: Not on file  . Alcohol Use: No  . Drug Use: No  . Sexually Active: Not on file   Other Topics Concern  . Not on file   Social History Narrative  . No narrative on file    Review  of Systems: See HPI, otherwise negative ROS  Physical Exam: BP 140/71  Pulse 100  Temp 97.4 F (36.3 C) (Temporal)  Ht 5' 1"  (1.549 m)  Wt 189 lb 12.8 oz (86.093 kg)  BMI 35.86 kg/m2 General:   Alert,  Well-developed, well-nourished, pleasant and cooperative in NAD Skin:  Intact without significant lesions or rashes no stigmata of chronic liver disease. Eyes:  Sclera clear, no icterus.   Conjunctiva pink. Ears:  Normal auditory acuity. Nose:  No deformity, discharge,  or lesions. Mouth:  No deformity or lesions. Neck:  Supple; no masses or thyromegaly.  No significant cervical adenopathy. Lungs:  Clear throughout to auscultation.   No wheezes, crackles, or rhonchi. No acute distress. Heart:  Regular rate and rhythm; no murmurs, clicks, rubs,  or gallops. Abdomen: Obese.  Soft and nontender without Liver is indistinct at right costal margin by percussion and palpation. Spleen is ballotable.  Pulses:  Normal pulses noted. Extremities:  Without clubbing or edema.  Impression/Plan:  Pleasant 61 year old lady with cirrhosis, Barrett's esophagus and history of colonic adenoma. Most likely etiology of cirrhosis is steatohepatitis however workup incomplete as patient did not followup. Iron deficiency anemia may not be a significant issue any longer. Pulmonary nodule felt to be benign previously but I feel needs one more chest x-ray to lady issue to rest. Patient needs primary prophylaxis against variceal hemorrhage with Inderal.  Immune status to hepatitis A to be determined.   Recommendations: Hepatic ultrasound; chest x-ray - f/u on prior abnormality  Afp, cbc, lfts ceruloplasim, ana, antismooth muscle, lfts , INR, met-7  Hep a antibody   Ov 6 weeks   Inderal 20 mg twice daily  EGD 2014  TCS 2016  Weight loss; regular aerobic excercise

## 2012-04-21 LAB — HEPATIC FUNCTION PANEL
ALT: 24 U/L (ref 0–35)
AST: 25 U/L (ref 0–37)
Alkaline Phosphatase: 71 U/L (ref 39–117)
Bilirubin, Direct: 0.3 mg/dL (ref 0.0–0.3)
Total Bilirubin: 1.5 mg/dL — ABNORMAL HIGH (ref 0.3–1.2)

## 2012-04-21 LAB — CBC WITH DIFFERENTIAL/PLATELET
Hemoglobin: 13.1 g/dL (ref 12.0–15.0)
Lymphocytes Relative: 27 % (ref 12–46)
Lymphs Abs: 1.1 10*3/uL (ref 0.7–4.0)
MCH: 29 pg (ref 26.0–34.0)
MCV: 86.5 fL (ref 78.0–100.0)
Monocytes Relative: 6 % (ref 3–12)
Neutrophils Relative %: 63 % (ref 43–77)
RDW: 13.3 % (ref 11.5–15.5)
WBC: 4.2 10*3/uL (ref 4.0–10.5)

## 2012-04-21 LAB — BASIC METABOLIC PANEL
BUN: 9 mg/dL (ref 6–23)
CO2: 26 mEq/L (ref 19–32)
Chloride: 103 mEq/L (ref 96–112)

## 2012-04-21 LAB — PROTIME-INR: Prothrombin Time: 14.9 seconds (ref 11.6–15.2)

## 2012-04-21 LAB — CERULOPLASMIN: Ceruloplasmin: 19 mg/dL — ABNORMAL LOW (ref 20–60)

## 2012-04-22 ENCOUNTER — Ambulatory Visit (HOSPITAL_COMMUNITY)
Admission: RE | Admit: 2012-04-22 | Discharge: 2012-04-22 | Disposition: A | Discharge status: Home / Self Care | Payer: BC Managed Care – PPO | Source: Ambulatory Visit | Attending: Internal Medicine | Admitting: Internal Medicine

## 2012-04-22 ENCOUNTER — Other Ambulatory Visit: Payer: Self-pay | Admitting: Internal Medicine

## 2012-04-22 DIAGNOSIS — K2270 Barrett's esophagus without dysplasia: Secondary | ICD-10-CM

## 2012-04-22 DIAGNOSIS — D126 Benign neoplasm of colon, unspecified: Secondary | ICD-10-CM

## 2012-04-22 DIAGNOSIS — R161 Splenomegaly, not elsewhere classified: Secondary | ICD-10-CM | POA: Insufficient documentation

## 2012-04-22 DIAGNOSIS — K7460 Unspecified cirrhosis of liver: Secondary | ICD-10-CM | POA: Insufficient documentation

## 2012-04-22 LAB — ANA: Anti Nuclear Antibody(ANA): NEGATIVE

## 2012-04-22 LAB — HEPATITIS A ANTIBODY, TOTAL: Hep A Total Ab: NEGATIVE

## 2012-05-31 ENCOUNTER — Ambulatory Visit (INDEPENDENT_AMBULATORY_CARE_PROVIDER_SITE_OTHER): Payer: BC Managed Care – PPO | Admitting: Gastroenterology

## 2012-05-31 ENCOUNTER — Encounter: Payer: Self-pay | Admitting: Gastroenterology

## 2012-05-31 VITALS — BP 122/76 | HR 81 | Temp 98.1°F | Ht 61.0 in | Wt 190.0 lb

## 2012-05-31 DIAGNOSIS — K2270 Barrett's esophagus without dysplasia: Secondary | ICD-10-CM

## 2012-05-31 DIAGNOSIS — K7460 Unspecified cirrhosis of liver: Secondary | ICD-10-CM

## 2012-05-31 NOTE — Progress Notes (Signed)
Faxed to PCP

## 2012-05-31 NOTE — Assessment & Plan Note (Signed)
Due surveillance exam 09/2012. OV 09/2012 to schedule.

## 2012-05-31 NOTE — Assessment & Plan Note (Signed)
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 She is not immune to Hep A. We will check Hep B immune status. Continue inderal. EGD 09/2012 for Barrett's and variceal surveillance.   Her ceruloplasmin was one point low. Consider repeating or further labs such as urinary copper. Will readdress at next OV with Dr. Gala Romney in 09/2012.

## 2012-05-31 NOTE — Patient Instructions (Addendum)
Please have your blood work done to check immunity to Hepatitis B. Office visit in 09/2012 for follow up and to schedule your next colonoscopy.

## 2012-05-31 NOTE — Progress Notes (Signed)
Primary Care Physician: Glenda Chroman., MD  Primary Gastroenterologist:  Garfield Cornea, MD   Chief Complaint  Patient presents with  . Follow-up    HPI: Alisha [name] is a 61 y.o. female here for one month f/u. Seen 04/20/12 by Dr. Gala Romney. She has had extensive w/u for IDA which failed to yield cause via mechanism of GI bleeding. She has had parenteral iron in [place], [place]. Has known esophageal varices. Barrett's esophagus on 2011 EGD. Colonic adenoma on 2007 TCS with negative exam 2011. Due surveillance EGD 2014, surveillance TCS 2016. 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 Since last OV, her labs were negative for hemochromatosis, PBC, autoimmune hepatitis. Ceruloplasmin is one point low. Previous viral markers negative for Hep B and C. She is not immune to Hep A. She is not sure if received Hep B vaccines before.   Overall feels well. 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 No lower extremity edema. No weight change since last OV. Tolerating Inderal. No other GI complaints.   Current Outpatient Prescriptions  Medication Sig Dispense Refill  . GLIPIZIDE XL 10 MG 24 hr tablet Take 10 mg by mouth daily.       Marland Kitchen lisinopril (PRINIVIL,ZESTRIL) 10 MG tablet Take 10 mg by mouth daily.       . metFORMIN (GLUCOPHAGE) 1000 MG tablet Take 1,000 mg by mouth 2 (two) times daily with a meal.       . NEXIUM 40 MG capsule Take 40 mg by mouth daily before breakfast.       . propranolol (INDERAL) 20 MG tablet Take 20 mg by mouth 2 (two) times daily.      Marland Kitchen VICTOZA 18 MG/3ML SOLN         Allergies as of 05/31/2012 - Review Complete 05/31/2012  Allergen Reaction Noted  . Codeine Rash 04/19/2012    ROS:  General: Negative for anorexia, weight loss, fever, chills, fatigue, weakness. ENT: Negative for hoarseness, difficulty swallowing , nasal congestion. CV: Negative for chest pain, angina, palpitations, dyspnea on exertion, peripheral edema.  Respiratory: Negative for dyspnea at rest, dyspnea on exertion,  cough, sputum, wheezing.  GI: See history of present illness. GU:  Negative for dysuria, hematuria, urinary incontinence, urinary frequency, nocturnal urination.  Endo: Negative for unusual weight change.    Physical Examination:   BP 122/76  Pulse 81  Temp 98.1 F (36.7 C) (Temporal)  Ht 5' 1"  (1.549 m)  Wt 190 lb (86.183 kg)  BMI 35.90 kg/m2  General: Well-nourished, well-developed in no acute distress. Accompanied by husband. Eyes: No icterus. Mouth: Oropharyngeal mucosa moist and pink , no lesions erythema or exudate. Lungs: Clear to auscultation bilaterally.  Heart: Regular rate and rhythm, no murmurs rubs or gallops.  Abdomen: Bowel sounds are normal, nontender, nondistended, no hepatosplenomegaly or masses, no abdominal bruits or hernia , no rebound or guarding.   Extremities: No lower extremity edema. No clubbing or deformities. Neuro: Alert and oriented x 4   Skin: Warm and dry, no jaundice.   Psych: Alert and cooperative, normal mood and affect.  Labs:  Lab Results  Component Value Date   CREATININE 0.80 04/21/2012   BUN 9 04/21/2012   NA 138 04/21/2012   K 4.9 04/21/2012   CL 103 04/21/2012   CO2 26 04/21/2012   Lab Results  Component Value Date   INR 1.12 04/21/2012   INR 1.27 09/23/2009   Smooth Muscle AB: negative ANA: negative Hep A total Ab: negative Ceruloplasmin: 19 (normal 20-60)  AFP 3.2  Lab Results  Component Value Date   WBC 4.2 04/21/2012   HGB 13.1 04/21/2012   HCT 39.1 04/21/2012   MCV 86.5 04/21/2012   PLT 64* 04/21/2012   Lab Results  Component Value Date   ALT 24 04/21/2012   AST 25 04/21/2012   ALKPHOS 71 04/21/2012   BILITOT 1.5* 04/21/2012

## 2012-07-01 ENCOUNTER — Telehealth: Payer: Self-pay | Admitting: Gastroenterology

## 2012-07-01 NOTE — Telephone Encounter (Signed)
Mailed letter to pt

## 2012-07-01 NOTE — Telephone Encounter (Signed)
Please remind patient to have her labs done.

## 2012-08-23 ENCOUNTER — Encounter: Payer: Self-pay | Admitting: *Deleted

## 2012-10-03 ENCOUNTER — Other Ambulatory Visit: Payer: Self-pay

## 2012-10-03 DIAGNOSIS — K7460 Unspecified cirrhosis of liver: Secondary | ICD-10-CM

## 2012-10-24 ENCOUNTER — Other Ambulatory Visit: Payer: Self-pay | Admitting: Internal Medicine

## 2012-10-24 LAB — AFP TUMOR MARKER: AFP-Tumor Marker: 2.8 ng/mL (ref 0.0–8.0)

## 2012-10-25 LAB — HEPATITIS B SURFACE ANTIBODY,QUALITATIVE: Hep B S Ab: NONREACTIVE

## 2012-10-26 ENCOUNTER — Other Ambulatory Visit: Payer: Self-pay | Admitting: Internal Medicine

## 2012-10-26 DIAGNOSIS — K7460 Unspecified cirrhosis of liver: Secondary | ICD-10-CM

## 2012-10-26 NOTE — Progress Notes (Signed)
I have the patient scheduled for Wednesday Feb 19th at 8:00 am and she is aware

## 2012-11-02 ENCOUNTER — Ambulatory Visit (HOSPITAL_COMMUNITY)
Admission: RE | Admit: 2012-11-02 | Discharge: 2012-11-02 | Disposition: A | Discharge status: Home / Self Care | Payer: BC Managed Care – PPO | Source: Ambulatory Visit | Attending: Internal Medicine | Admitting: Internal Medicine

## 2012-11-02 DIAGNOSIS — R161 Splenomegaly, not elsewhere classified: Secondary | ICD-10-CM | POA: Insufficient documentation

## 2012-11-02 DIAGNOSIS — K8020 Calculus of gallbladder without cholecystitis without obstruction: Secondary | ICD-10-CM | POA: Insufficient documentation

## 2012-11-02 DIAGNOSIS — K7460 Unspecified cirrhosis of liver: Secondary | ICD-10-CM | POA: Insufficient documentation

## 2012-11-04 ENCOUNTER — Encounter: Payer: Self-pay | Admitting: Internal Medicine

## 2012-11-04 ENCOUNTER — Ambulatory Visit (INDEPENDENT_AMBULATORY_CARE_PROVIDER_SITE_OTHER): Payer: BC Managed Care – PPO | Admitting: Internal Medicine

## 2012-11-04 VITALS — BP 118/70 | HR 79 | Temp 97.9°F | Ht 61.0 in | Wt 189.6 lb

## 2012-11-04 DIAGNOSIS — K7460 Unspecified cirrhosis of liver: Secondary | ICD-10-CM

## 2012-11-04 DIAGNOSIS — K2270 Barrett's esophagus without dysplasia: Secondary | ICD-10-CM

## 2012-11-04 NOTE — Patient Instructions (Signed)
Obtain hepatitis A and B vaccination  Serum ceruloplasim, hepatic profile, CBC and B-Met  Set up surveillance EGD (Barretts)

## 2012-11-04 NOTE — Progress Notes (Signed)
Primary Care Physician:  Glenda Chroman., MD Primary Gastroenterologist:  Dr. Gala Romney  Pre-Procedure History & Physical: HPI:  Alisha [name] is a 62 y.o. female here for followup of Nash/wrists. Has Barrett's esophagus. Due for surveillance EGD now. Hasn't had any some symptoms of bleeding such as melena or hematochezia; history of tubular adenoma. Due for colonoscopy 2016. Recent ultrasound demonstrated cholelithiasis, cirrhotic liver no evidence of tumor. Last alpha-fetoprotein done was normal. She has grade 2 esophageal varices on prior EGD. She's on Inderal. She has asked about a referral to a liver transplant Center.  Hepatitis A and hepatitis B antibodies have come back negative so she is susceptible to hepatitis A and B. and, consequently, needs vaccination. Ceruloplasmin 1 point below normal previously. Past Medical History  Diagnosis Date  . Cirrhosis of liver without mention of alcohol     hep B surface antigen and HCV ab negative.  . Esophageal reflux   . Iron deficiency anemia, unspecified   . Barrett's esophagus   . Type II or unspecified type diabetes mellitus without mention of complication, not stated as uncontrolled   . Unspecified hemorrhoids without mention of complication   . Unspecified essential hypertension   . Other and unspecified hyperlipidemia   . Tubular adenoma   . Esophageal varices   . Diverticula of colon     pancolonic    Past Surgical History  Procedure Laterality Date  . Abdominal hysterectomy    . Cesarean section      x 2  . Hemorrhoid surgery  2008  . Esophagogastroduodenoscopy  08/2006    barretts esophagus, no dyplasia  . Esophagogastroduodenoscopy  03/2006    barretts esophagus,bx focal atypia c/w low grade dysplasia, SB bx negative for celiac  . Colonoscopy  03/2006    left sided diverticula, splenic flexure tublar adenoma  . Colonoscopy  07/2002    villous tubular adenoma in rectum  . Colonoscopy  09/23/09    external  hemorrhoids/scattered pan colonic diverticula otherwise normal. cecal lipoma bx negative, normal TI. Next TCS 09/2014  . Esophagogastroduodenoscopy  09/23/09    3 columns of grade 2 esophageal varices/hiatal hernia/3-4 cm segment Barrett's esophagus without dysplasia. Next EGD 09/2012  . Small bowel capsule endoscopy  10/2009    normal    Prior to Admission medications   Medication Sig Start Date End Date Taking? Authorizing Provider  GLIPIZIDE XL 10 MG 24 hr tablet Take 10 mg by mouth daily.  02/24/12  Yes Historical Provider, MD  lisinopril (PRINIVIL,ZESTRIL) 10 MG tablet Take 10 mg by mouth daily.  04/09/12  Yes Historical Provider, MD  metFORMIN (GLUCOPHAGE) 1000 MG tablet Take 1,000 mg by mouth 2 (two) times daily with a meal.  02/09/12  Yes Historical Provider, MD  NEXIUM 40 MG capsule Take 40 mg by mouth daily before breakfast.  04/14/12  Yes Historical Provider, MD  propranolol (INDERAL) 20 MG tablet TAKE ONE TABLET TWICE DAILY 10/24/12  Yes Orvil Feil, NP  VICTOZA 18 MG/3ML SOLN 1.8 mg daily.  04/15/12  Yes Historical Provider, MD    Allergies as of 11/04/2012 - Review Complete 11/04/2012  Allergen Reaction Noted  . Codeine Rash 04/19/2012    No family history on file.  History   Social History  . Marital Status: Married    Spouse Name: N/A    Number of Children: N/A  . Years of Education: N/A   Occupational History  . Not on file.   Social History Main Topics  . Smoking  status: Never Smoker   . Smokeless tobacco: Not on file  . Alcohol Use: No  . Drug Use: No  . Sexually Active: Not on file   Other Topics Concern  . Not on file   Social History Narrative  . No narrative on file    Review of Systems: See HPI, otherwise negative ROS  Physical Exam: BP 118/70  Pulse 79  Temp(Src) 97.9 F (36.6 C) (Oral)  Ht 5' 1"  (1.549 m)  Wt 189 lb 9.6 oz (86.002 kg)  BMI 35.84 kg/m2 General:   Alert,  Well-developed, well-nourished, pleasant and cooperative in NAD Skin:   Intact without significant lesions or rashes.  Any stigmata of chronic liver disease. Eyes:  Sclera clear, no icterus.   Conjunctiva pink. Ears:  Normal auditory acuity. Nose:  No deformity, discharge,  or lesions. Mouth:  No deformity or lesions. Neck:  Supple; no masses or thyromegaly. No significant cervical adenopathy. Lungs:  Clear throughout to auscultation.   No wheezes, crackles, or rhonchi. No acute distress. Heart:  Regular rate and rhythm; no murmurs, clicks, rubs,  or gallops. Abdomen: Non-distended, normal bowel sounds.  Soft and nontender without appreciable mass or hepatosplenomegaly.  Pulses:  Normal pulses noted. Extremities:  Without clubbing or edema.  Impression/Plan:  Well compensated lady with Nash/cirrhosis. Meld score 13 - a little low for referral to transplant Center at this time, however, would not be unreasonable to have her get acquainted with the transplant Center a little later in the year electively. History of Barrett's esophagus GERD well-controlled at this time. Due for EGD now l.; Will be due for surveillance colonoscopy 2016   Recommendations:  Surveillance EGD.The risks, benefits, limitations, alternatives and imponderables have been reviewed with the patient. Potential for esophageal dilation, biopsy, etc. have also been reviewed.  Questions have been answered. All parties agreeable.  Update labs to see if MELD has changed any. Plan for hepatitis A and B. vaccination. Repeat serum ceruloplasmin.  Further recommendations to follow.

## 2012-11-09 ENCOUNTER — Encounter (HOSPITAL_COMMUNITY): Payer: Self-pay | Admitting: Pharmacy Technician

## 2012-11-10 MED ORDER — SODIUM CHLORIDE 0.45 % IV SOLN
INTRAVENOUS | Status: DC
  Administered 2012-11-11: 1000 mL via INTRAVENOUS

## 2012-11-11 ENCOUNTER — Telehealth: Payer: Self-pay | Admitting: Internal Medicine

## 2012-11-11 ENCOUNTER — Encounter (HOSPITAL_COMMUNITY): Payer: Self-pay | Admitting: *Deleted

## 2012-11-11 ENCOUNTER — Encounter (HOSPITAL_COMMUNITY)
Admission: RE | Disposition: A | Discharge status: Home / Self Care | Payer: Self-pay | Source: Ambulatory Visit | Attending: Internal Medicine

## 2012-11-11 ENCOUNTER — Ambulatory Visit (HOSPITAL_COMMUNITY)
Admission: RE | Admit: 2012-11-11 | Discharge: 2012-11-11 | Disposition: A | Discharge status: Home / Self Care | Payer: BC Managed Care – PPO | Source: Ambulatory Visit | Attending: Internal Medicine | Admitting: Internal Medicine

## 2012-11-11 DIAGNOSIS — K319 Disease of stomach and duodenum, unspecified: Secondary | ICD-10-CM

## 2012-11-11 DIAGNOSIS — E119 Type 2 diabetes mellitus without complications: Secondary | ICD-10-CM | POA: Insufficient documentation

## 2012-11-11 DIAGNOSIS — K449 Diaphragmatic hernia without obstruction or gangrene: Secondary | ICD-10-CM

## 2012-11-11 DIAGNOSIS — I10 Essential (primary) hypertension: Secondary | ICD-10-CM | POA: Insufficient documentation

## 2012-11-11 DIAGNOSIS — Z01812 Encounter for preprocedural laboratory examination: Secondary | ICD-10-CM | POA: Insufficient documentation

## 2012-11-11 DIAGNOSIS — I8500 Esophageal varices without bleeding: Secondary | ICD-10-CM

## 2012-11-11 DIAGNOSIS — I8510 Secondary esophageal varices without bleeding: Secondary | ICD-10-CM | POA: Insufficient documentation

## 2012-11-11 DIAGNOSIS — K2270 Barrett's esophagus without dysplasia: Secondary | ICD-10-CM

## 2012-11-11 DIAGNOSIS — K7460 Unspecified cirrhosis of liver: Secondary | ICD-10-CM | POA: Insufficient documentation

## 2012-11-11 HISTORY — PX: ESOPHAGOGASTRODUODENOSCOPY: SHX5428

## 2012-11-11 SURGERY — EGD (ESOPHAGOGASTRODUODENOSCOPY)
Anesthesia: Moderate Sedation

## 2012-11-11 MED ORDER — BUTAMBEN-TETRACAINE-BENZOCAINE 2-2-14 % EX AERO
INHALATION_SPRAY | CUTANEOUS | Status: DC | PRN
  Administered 2012-11-11: 2 via TOPICAL

## 2012-11-11 MED ORDER — MEPERIDINE HCL 100 MG/ML IJ SOLN
INTRAMUSCULAR | Status: AC
  Filled 2012-11-11: qty 1

## 2012-11-11 MED ORDER — MIDAZOLAM HCL 5 MG/5ML IJ SOLN
INTRAMUSCULAR | Status: AC
  Filled 2012-11-11: qty 10

## 2012-11-11 MED ORDER — MEPERIDINE HCL 100 MG/ML IJ SOLN
INTRAMUSCULAR | Status: DC | PRN
  Administered 2012-11-11 (×2): 25 mg via INTRAVENOUS

## 2012-11-11 MED ORDER — ONDANSETRON HCL 4 MG/2ML IJ SOLN
INTRAMUSCULAR | Status: AC
  Filled 2012-11-11: qty 2

## 2012-11-11 MED ORDER — STERILE WATER FOR IRRIGATION IR SOLN
Status: DC | PRN
  Administered 2012-11-11: 07:00:00

## 2012-11-11 MED ORDER — ONDANSETRON HCL 4 MG/2ML IJ SOLN
INTRAMUSCULAR | Status: DC | PRN
  Administered 2012-11-11: 4 mg via INTRAVENOUS

## 2012-11-11 MED ORDER — MIDAZOLAM HCL 5 MG/5ML IJ SOLN
INTRAMUSCULAR | Status: DC | PRN
  Administered 2012-11-11: 1 mg via INTRAVENOUS
  Administered 2012-11-11: 2 mg via INTRAVENOUS
  Administered 2012-11-11: 1 mg via INTRAVENOUS

## 2012-11-11 NOTE — H&P (View-Only) (Signed)
Primary Care Physician:  Glenda Chroman., MD Primary Gastroenterologist:  Dr. Gala Romney  Pre-Procedure History & Physical: HPI:  Alisha [name] is a 62 y.o. female here for followup of Nash/wrists. Has Barrett's esophagus. Due for surveillance EGD now. Hasn't had any some symptoms of bleeding such as melena or hematochezia; history of tubular adenoma. Due for colonoscopy 2016. Recent ultrasound demonstrated cholelithiasis, cirrhotic liver no evidence of tumor. Last alpha-fetoprotein done was normal. She has grade 2 esophageal varices on prior EGD. She's on Inderal. She has asked about a referral to a liver transplant Center.  Hepatitis A and hepatitis B antibodies have come back negative so she is susceptible to hepatitis A and B. and, consequently, needs vaccination. Ceruloplasmin 1 point below normal previously. Past Medical History  Diagnosis Date  . Cirrhosis of liver without mention of alcohol     hep B surface antigen and HCV ab negative.  . Esophageal reflux   . Iron deficiency anemia, unspecified   . Barrett's esophagus   . Type II or unspecified type diabetes mellitus without mention of complication, not stated as uncontrolled   . Unspecified hemorrhoids without mention of complication   . Unspecified essential hypertension   . Other and unspecified hyperlipidemia   . Tubular adenoma   . Esophageal varices   . Diverticula of colon     pancolonic    Past Surgical History  Procedure Laterality Date  . Abdominal hysterectomy    . Cesarean section      x 2  . Hemorrhoid surgery  2008  . Esophagogastroduodenoscopy  08/2006    barretts esophagus, no dyplasia  . Esophagogastroduodenoscopy  03/2006    barretts esophagus,bx focal atypia c/w low grade dysplasia, SB bx negative for celiac  . Colonoscopy  03/2006    left sided diverticula, splenic flexure tublar adenoma  . Colonoscopy  07/2002    villous tubular adenoma in rectum  . Colonoscopy  09/23/09    external  hemorrhoids/scattered pan colonic diverticula otherwise normal. cecal lipoma bx negative, normal TI. Next TCS 09/2014  . Esophagogastroduodenoscopy  09/23/09    3 columns of grade 2 esophageal varices/hiatal hernia/3-4 cm segment Barrett's esophagus without dysplasia. Next EGD 09/2012  . Small bowel capsule endoscopy  10/2009    normal    Prior to Admission medications   Medication Sig Start Date End Date Taking? Authorizing Provider  GLIPIZIDE XL 10 MG 24 hr tablet Take 10 mg by mouth daily.  02/24/12  Yes Historical Provider, MD  lisinopril (PRINIVIL,ZESTRIL) 10 MG tablet Take 10 mg by mouth daily.  04/09/12  Yes Historical Provider, MD  metFORMIN (GLUCOPHAGE) 1000 MG tablet Take 1,000 mg by mouth 2 (two) times daily with a meal.  02/09/12  Yes Historical Provider, MD  NEXIUM 40 MG capsule Take 40 mg by mouth daily before breakfast.  04/14/12  Yes Historical Provider, MD  propranolol (INDERAL) 20 MG tablet TAKE ONE TABLET TWICE DAILY 10/24/12  Yes Orvil Feil, NP  VICTOZA 18 MG/3ML SOLN 1.8 mg daily.  04/15/12  Yes Historical Provider, MD    Allergies as of 11/04/2012 - Review Complete 11/04/2012  Allergen Reaction Noted  . Codeine Rash 04/19/2012    No family history on file.  History   Social History  . Marital Status: Married    Spouse Name: N/A    Number of Children: N/A  . Years of Education: N/A   Occupational History  . Not on file.   Social History Main Topics  . Smoking  status: Never Smoker   . Smokeless tobacco: Not on file  . Alcohol Use: No  . Drug Use: No  . Sexually Active: Not on file   Other Topics Concern  . Not on file   Social History Narrative  . No narrative on file    Review of Systems: See HPI, otherwise negative ROS  Physical Exam: BP 118/70  Pulse 79  Temp(Src) 97.9 F (36.6 C) (Oral)  Ht 5' 1"  (1.549 m)  Wt 189 lb 9.6 oz (86.002 kg)  BMI 35.84 kg/m2 General:   Alert,  Well-developed, well-nourished, pleasant and cooperative in NAD Skin:   Intact without significant lesions or rashes.  Any stigmata of chronic liver disease. Eyes:  Sclera clear, no icterus.   Conjunctiva pink. Ears:  Normal auditory acuity. Nose:  No deformity, discharge,  or lesions. Mouth:  No deformity or lesions. Neck:  Supple; no masses or thyromegaly. No significant cervical adenopathy. Lungs:  Clear throughout to auscultation.   No wheezes, crackles, or rhonchi. No acute distress. Heart:  Regular rate and rhythm; no murmurs, clicks, rubs,  or gallops. Abdomen: Non-distended, normal bowel sounds.  Soft and nontender without appreciable mass or hepatosplenomegaly.  Pulses:  Normal pulses noted. Extremities:  Without clubbing or edema.  Impression/Plan:  Well compensated lady with Nash/cirrhosis. Meld score 13 - a little low for referral to transplant Center at this time, however, would not be unreasonable to have her get acquainted with the transplant Center a little later in the year electively. History of Barrett's esophagus GERD well-controlled at this time. Due for EGD now l.; Will be due for surveillance colonoscopy 2016   Recommendations:  Surveillance EGD.The risks, benefits, limitations, alternatives and imponderables have been reviewed with the patient. Potential for esophageal dilation, biopsy, etc. have also been reviewed.  Questions have been answered. All parties agreeable.  Update labs to see if MELD has changed any. Plan for hepatitis A and B. vaccination. Repeat serum ceruloplasmin.  Further recommendations to follow.

## 2012-11-11 NOTE — Telephone Encounter (Signed)
Message copied by Feliberto Gottron on Fri Nov 11, 2012  8:46 AM ------      Message from: Daneil Dolin      Created: Fri Nov 11, 2012  8:04 AM       Please initiate a referral to the [place] liver transplant Center in reference to [place] cirrhosis ------

## 2012-11-11 NOTE — Op Note (Signed)
[facility] [address] [place], [address]   ENDOSCOPY PROCEDURE REPORT  PATIENT: Alisha [name], Alisha [name]  MR#: [id number] BIRTHDATE: Oct 18, 1950 , 31  yrs. old GENDER: Female ENDOSCOPIST: R.  Garfield Cornea, MD FACP FACG REFERRED BY:  Monico Blitz, M.D. PROCEDURE DATE:  11/11/2012 PROCEDURE:     EGD with esophageal biopsy  INDICATIONS:    History of Barrett's esophagus; due for surveillance examination. Cirrhosis with known esophageal varices  INFORMED CONSENT:   The risks, benefits, limitations, alternatives and imponderables have been discussed.  The potential for biopsy, esophogeal dilation, etc. have also been reviewed.  Questions have been answered.  All parties agreeable.  Please see the history and physical in the medical record for more information.  MEDICATIONS:    Versed 4 mg IV and Demerol 50 mg IV in divided doses. Zofran 4 mg IV. Cetacaine spray.  DESCRIPTION OF PROCEDURE:   The Pentax Gastroscope [id number] endoscope was introduced through the mouth and advanced to the second portion of the duodenum without difficulty or limitations. The mucosal surfaces were surveyed very carefully during advancement of the scope and upon withdrawal.  Retroflexion view of the proximal stomach and esophagogastric junction was performed.      FINDINGS: 3 columns of grade 2 esophageal varices with no bleeding stigmata. Salmon-colored epithelium coming up the good 3-4 cm from the GE junction in a geographic distribution. Please see photos. No tumor seen. No esophagitis seen. EG junction easily traversed. Stomach empty. Small hiatal hernia. Dffuse snakeskin or "fishscale" appearance of the gastric mucosa consistent with portal gastropathy. No gastric varices. Patent pylorus. Normal first and second portion of the duodenum.  THERAPEUTIC / DIAGNOSTIC MANEUVERS PERFORMED:  Conservative biopsies of salmon-colored epithelium taken between the variceal columns. This was  done without significant bleeding or other apparent complication.   COMPLICATIONS:  None  IMPRESSION:   Barrett's esophagus-status post biopsy. Esophageal varices. Portal gastropathy. Hiatal hernia.  RECOMMENDATIONS:  Followup on pathology. Continue Nexium and Inderal. Per patient request, will initiate a referral to the [facility] liver transplant Center.    _______________________________ R. Garfield Cornea, MD FACP [facility] eSigned:  R. Garfield Cornea, MD FACP [facility] 11/11/2012 8:13 AM     CC:  PATIENT NAME:  Kathrin, Folden MR#: [id number]

## 2012-11-11 NOTE — Interval H&P Note (Signed)
History and Physical Interval Note:  11/11/2012 7:35 AM  Alisha [name]  has presented today for surgery, with the diagnosis of Barretts Esophagus  The various methods of treatment have been discussed with the patient and family. After consideration of risks, benefits and other options for treatment, the patient has consented to  Procedure(s) with comments: ESOPHAGOGASTRODUODENOSCOPY (EGD) (N/A) - 11:45-moved to 7:30 am Darius Bump notified pt as a surgical intervention .  The patient's history has been reviewed, patient examined, no change in status, stable for surgery.  I have reviewed the patient's chart and labs.  Questions were answered to the patient's satisfaction.     [name] [name]  EGD today primarily for [employer].The risks, benefits, limitations, alternatives and imponderables have been reviewed with the patient. Potential for esophageal dilation, biopsy, etc. have also been reviewed.  Questions have been answered. All parties agreeable.

## 2012-11-11 NOTE — Telephone Encounter (Signed)
Referral has been made they will contact Alisha [name] and set up date and time with her for consult

## 2012-11-14 ENCOUNTER — Encounter (HOSPITAL_COMMUNITY): Payer: Self-pay | Admitting: Internal Medicine

## 2012-11-15 ENCOUNTER — Encounter: Payer: Self-pay | Admitting: Internal Medicine

## 2012-11-15 NOTE — Progress Notes (Signed)
Quick Note:  Please let pt know she is not immune to Hepatitis B & should have Hep B vaccine series unless she already has. Thanks JG:OTLX,BWIOM B., MD  ______

## 2012-11-16 ENCOUNTER — Encounter: Payer: Self-pay | Admitting: *Deleted

## 2012-11-21 NOTE — Progress Notes (Signed)
Results faxed to PCP

## 2012-11-25 ENCOUNTER — Encounter: Payer: Self-pay | Admitting: Internal Medicine

## 2012-12-29 DIAGNOSIS — K7581 Nonalcoholic steatohepatitis (NASH): Secondary | ICD-10-CM | POA: Insufficient documentation

## 2013-01-02 ENCOUNTER — Other Ambulatory Visit: Payer: Self-pay

## 2013-01-04 ENCOUNTER — Other Ambulatory Visit: Payer: Self-pay

## 2013-01-04 MED ORDER — PROPRANOLOL HCL 20 MG PO TABS: ORAL_TABLET | ORAL | Status: DC

## 2013-05-08 ENCOUNTER — Other Ambulatory Visit: Payer: Self-pay | Admitting: Gastroenterology

## 2013-06-27 ENCOUNTER — Ambulatory Visit (INDEPENDENT_AMBULATORY_CARE_PROVIDER_SITE_OTHER): Payer: BC Managed Care – PPO | Admitting: Internal Medicine

## 2013-06-27 ENCOUNTER — Encounter (INDEPENDENT_AMBULATORY_CARE_PROVIDER_SITE_OTHER): Payer: Self-pay

## 2013-06-27 ENCOUNTER — Encounter: Payer: Self-pay | Admitting: Internal Medicine

## 2013-06-27 ENCOUNTER — Other Ambulatory Visit: Payer: Self-pay | Admitting: Internal Medicine

## 2013-06-27 VITALS — BP 120/70 | HR 75 | Temp 98.3°F | Ht 61.0 in | Wt 190.6 lb

## 2013-06-27 DIAGNOSIS — K7460 Unspecified cirrhosis of liver: Secondary | ICD-10-CM

## 2013-06-27 NOTE — Progress Notes (Signed)
Primary Care Physician:  Glenda Chroman., MD Primary Gastroenterologist:  Dr. Gala Romney  Pre-Procedure History & Physical: HPI:  Alisha [name] is a 62 y.o. female here for followup of Nash/erosive this and GERD. Doing well went to the [facility]. Was evaluated. Doesn't need a liver transplant at this time. She's done very well. GERD well-controlled. Barrett's esophagus-due for surveillance EGD 2017; history of colonic adenoma-due for surveillance colonoscopy 2017. Needs a serum ceruloplasmin repeat. Due for a hepatic ultrasound now. Overdue to get hepatitis A and B. Vaccines.  Grade 2 esophageal varices on Inderal.  Past Medical History  Diagnosis Date  . Cirrhosis of liver without mention of alcohol     hep B surface antigen and HCV ab negative.pt has not had hepatitis A and B vaccines.U/S on 11/02/12 shows cirrhosis.  . Esophageal reflux   . Iron deficiency anemia, unspecified   . Barrett's esophagus   . Type II or unspecified type diabetes mellitus without mention of complication, not stated as uncontrolled   . Unspecified hemorrhoids without mention of complication   . Unspecified essential hypertension   . Other and unspecified hyperlipidemia   . Tubular adenoma   . Esophageal varices   . Diverticula of colon     pancolonic  . Portal hypertensive gastropathy   . Hiatal hernia     Past Surgical History  Procedure Laterality Date  . Abdominal hysterectomy    . Cesarean section      x 2  . Hemorrhoid surgery  2008  . Esophagogastroduodenoscopy  08/2006    barretts esophagus, no dyplasia  . Esophagogastroduodenoscopy  03/2006    barretts esophagus,bx focal atypia c/w low grade dysplasia, SB bx negative for celiac  . Colonoscopy  03/2006    left sided diverticula, splenic flexure tublar adenoma  . Colonoscopy  07/2002    villous tubular adenoma in rectum  . Colonoscopy  09/23/09    external hemorrhoids/scattered pan colonic diverticula otherwise normal. cecal lipoma bx  negative, normal TI. Next TCS 09/2014  . Esophagogastroduodenoscopy  09/23/09    3 columns of grade 2 esophageal varices/hiatal hernia/3-4 cm segment Barrett's esophagus without dysplasia. Next EGD 09/2012  . Small bowel capsule endoscopy  10/2009    normal  . Esophagogastroduodenoscopy N/A 11/11/2012    Dr. Gala Romney- Barrett;s esophagus, esophageal varices, portal gastopathy. hiatal hernia    Prior to Admission medications   Medication Sig Start Date End Date Taking? Authorizing Provider  GLIPIZIDE XL 10 MG 24 hr tablet Take 10 mg by mouth daily.  02/24/12  Yes Historical Provider, MD  lisinopril (PRINIVIL,ZESTRIL) 10 MG tablet Take 10 mg by mouth daily.  04/09/12  Yes Historical Provider, MD  metFORMIN (GLUCOPHAGE) 1000 MG tablet Take 1,000 mg by mouth 2 (two) times daily with a meal.  02/09/12  Yes Historical Provider, MD  NEXIUM 40 MG capsule Take 40 mg by mouth daily before breakfast.  04/14/12  Yes Historical Provider, MD  propranolol (INDERAL) 20 MG tablet TAKE ONE TABLET TWICE DAILY 01/04/13  Yes Orvil Feil, NP  propranolol (INDERAL) 20 MG tablet TAKE ONE TABLET BY MOUTH TWICE DAILY 05/08/13  Yes Orvil Feil, NP  VICTOZA 18 MG/3ML SOLN 1.8 mg daily.  04/15/12  Yes Historical Provider, MD    Allergies as of 06/27/2013 - Review Complete 06/27/2013  Allergen Reaction Noted  . Codeine Nausea And Vomiting 04/19/2012    Family History  Problem Relation Age of Onset  . Alzheimer's disease Mother   . CAD Father   .  Diabetes Mellitus II Father   . Alzheimer's disease Father   . Diabetes Mellitus II Sister   . Cancer - Other Brother   . Diabetes Mellitus II Brother   . Hypertension Brother     History   Social History  . Marital Status: Married    Spouse Name: N/A    Number of Children: N/A  . Years of Education: N/A   Occupational History  . Not on file.   Social History Main Topics  . Smoking status: Never Smoker   . Smokeless tobacco: Not on file  . Alcohol Use: No  . Drug Use:  No  . Sexual Activity: Not on file   Other Topics Concern  . Not on file   Social History Narrative  . No narrative on file    Review of Systems: See HPI, otherwise negative ROS  Physical Exam: BP 120/70  Pulse 75  Temp(Src) 98.3 F (36.8 C) (Oral)  Ht 5' 1"  (1.549 m)  Wt 190 lb 9.6 oz (86.456 kg)  BMI 36.03 kg/m2 General:   Alert,  Well-developed, well-nourished, pleasant and cooperative in NAD Skin:  Intact without significant lesions or rashes. Eyes:  Sclera clear, no icterus.   Conjunctiva pink. Ears:  Normal auditory acuity. Nose:  No deformity, discharge,  or lesions. Mouth:  No deformity or lesions. Neck:  Supple; no masses or thyromegaly. No significant cervical adenopathy. Lungs:  Clear throughout to auscultation.   No wheezes, crackles, or rhonchi. No acute distress. Heart:  Regular rate and rhythm; no murmurs, clicks, rubs,  or gallops. Abdomen: Obese. Positive bowel sounds -  liver edge indistinct below the right costal margin. Do not appreciate a spleen today. No obvious ascites. No other masses.  Pulses:  Normal pulses noted. Extremities:  Without clubbing or edema.  Impression Nash/cirrhosis remains well compensated.  Needs hepatitis A and B. vaccine. Needs an ultrasound. Needs ceruloplasmin repeated.  Recommendations: Continue Inderal and Nexium Serum ceruloplasm Hepatic U/S now Vaccine for Hep A and B EGD 2017 Colonoscopy 2016 Office visit 1 year.  Daily coffee consumption will likely help with fatty liver  Continue regular aerobic exercise 3 times weekly

## 2013-06-27 NOTE — Patient Instructions (Signed)
Continue Inderal and Nexium Serum ceruloplasm Hepatic U/S now Vaccine for Hep A and B EGD 2017 Colonoscopy 2016 Office visit 1 year

## 2013-07-04 ENCOUNTER — Ambulatory Visit (HOSPITAL_COMMUNITY)
Admission: RE | Admit: 2013-07-04 | Discharge: 2013-07-04 | Disposition: A | Discharge status: Home / Self Care | Payer: BC Managed Care – PPO | Source: Ambulatory Visit | Attending: Internal Medicine | Admitting: Internal Medicine

## 2013-07-04 DIAGNOSIS — K7460 Unspecified cirrhosis of liver: Secondary | ICD-10-CM

## 2013-07-04 DIAGNOSIS — K7689 Other specified diseases of liver: Secondary | ICD-10-CM | POA: Insufficient documentation

## 2013-07-04 DIAGNOSIS — R161 Splenomegaly, not elsewhere classified: Secondary | ICD-10-CM | POA: Insufficient documentation

## 2013-07-04 DIAGNOSIS — K8020 Calculus of gallbladder without cholecystitis without obstruction: Secondary | ICD-10-CM | POA: Insufficient documentation

## 2013-09-06 ENCOUNTER — Other Ambulatory Visit: Payer: Self-pay | Admitting: Gastroenterology

## 2013-12-19 ENCOUNTER — Encounter: Payer: Self-pay | Admitting: Internal Medicine

## 2013-12-19 ENCOUNTER — Telehealth: Payer: Self-pay | Admitting: Internal Medicine

## 2013-12-19 NOTE — Telephone Encounter (Signed)
Pt is on MAY recall list for U/S in 6 months

## 2013-12-19 NOTE — Telephone Encounter (Signed)
Letter has been mailed to the patient

## 2014-03-05 ENCOUNTER — Other Ambulatory Visit: Payer: Self-pay | Admitting: Gastroenterology

## 2014-03-06 ENCOUNTER — Other Ambulatory Visit: Payer: Self-pay | Admitting: Gastroenterology

## 2014-03-06 ENCOUNTER — Telehealth: Payer: Self-pay | Admitting: Gastroenterology

## 2014-03-06 NOTE — Telephone Encounter (Signed)
Please f/u on propanolol refill. I filled for 33m tabs take two BID but I noticed we previously had her on 259mone BID.   Did pharmacy change or did we make error placing the request?

## 2014-03-06 NOTE — Telephone Encounter (Signed)
Pt's husband called stating [facility] in [place] faxed over a RX refill yesterday and pt's husband called pharmacy today and they still do not have it. Please advise [phone]

## 2014-03-06 NOTE — Telephone Encounter (Signed)
I spoke with Alisha [name] at [facility]. He said they have been having some problems getting the 39m, so the last time they filled it, they have her 134mtwo bid and [place]

## 2014-03-06 NOTE — Telephone Encounter (Signed)
Thanks

## 2014-09-03 ENCOUNTER — Other Ambulatory Visit: Payer: Self-pay | Admitting: Gastroenterology

## 2014-10-05 ENCOUNTER — Ambulatory Visit: Payer: BC Managed Care – PPO | Admitting: Internal Medicine

## 2014-10-30 ENCOUNTER — Ambulatory Visit (INDEPENDENT_AMBULATORY_CARE_PROVIDER_SITE_OTHER): Payer: BC Managed Care – PPO | Admitting: Internal Medicine

## 2014-10-30 ENCOUNTER — Encounter: Payer: Self-pay | Admitting: Internal Medicine

## 2014-10-30 ENCOUNTER — Other Ambulatory Visit: Payer: Self-pay

## 2014-10-30 VITALS — BP 129/66 | HR 98 | Temp 97.1°F | Ht 61.0 in | Wt 181.4 lb

## 2014-10-30 DIAGNOSIS — K7469 Other cirrhosis of liver: Secondary | ICD-10-CM

## 2014-10-30 DIAGNOSIS — Z86010 Personal history of colonic polyps: Secondary | ICD-10-CM

## 2014-10-30 DIAGNOSIS — K7460 Unspecified cirrhosis of liver: Secondary | ICD-10-CM

## 2014-10-30 MED ORDER — PEG 3350-KCL-NA BICARB-NACL 420 G PO SOLR: 4000.0000 mL | ORAL | Status: DC

## 2014-10-30 NOTE — Progress Notes (Signed)
Primary Care Physician:  Glenda Chroman., MD Primary Gastroenterologist:  Dr. Gala Romney  Pre-Procedure History & Physical: HPI:  Alisha [name] is a 64 y.o. female here for follow-up  of GERD, Barrett's esophagus, [name] cirrhosis, history of the high-grade adenomas in the past removed from colon. She's not yet been vaccinated against hepatitis A and B. Last ultrasound of the liver was 2014 ( cirrhosis and cholelithiasis). Has done well. Has received both parenteral iron and transfusion because of ongoing IDA. Marland Kitchen No dysphagia. No rectal bleeding or melena.  History of slightly depressed ceruloplasmin previously.  Prior evaluation at the [facility]. Lung lesion previously followed up with chest CT and PET and subsequent plain films all negative for malignancy.  Past Medical History  Diagnosis Date  . Cirrhosis of liver without mention of alcohol     hep B surface antigen and HCV ab negative.pt has not had hepatitis A and B vaccines.U/S on 07/04/13 shows cirrhosis.  . Esophageal reflux   . Iron deficiency anemia, unspecified   . Barrett's esophagus   . Type II or unspecified type diabetes mellitus without mention of complication, not stated as uncontrolled   . Unspecified hemorrhoids without mention of complication   . Unspecified essential hypertension   . Other and unspecified hyperlipidemia   . Tubular adenoma   . Esophageal varices   . Diverticula of colon     pancolonic  . Portal hypertensive gastropathy   . Hiatal hernia     Past Surgical History  Procedure Laterality Date  . Abdominal hysterectomy    . Cesarean section      x 2  . Hemorrhoid surgery  2008  . Esophagogastroduodenoscopy  08/2006    barretts esophagus, no dyplasia  . Esophagogastroduodenoscopy  03/2006    barretts esophagus,bx focal atypia c/w low grade dysplasia, SB bx negative for celiac  . Colonoscopy  03/2006    left sided diverticula, splenic flexure tublar adenoma  . Colonoscopy  07/2002   villous tubular adenoma in rectum  . Colonoscopy  09/23/09    external hemorrhoids/scattered pan colonic diverticula otherwise normal. cecal lipoma bx negative, normal TI. Next TCS 09/2014  . Esophagogastroduodenoscopy  09/23/09    3 columns of grade 2 esophageal varices/hiatal hernia/3-4 cm segment Barrett's esophagus without dysplasia. Next EGD 09/2012  . Small bowel capsule endoscopy  10/2009    normal  . Esophagogastroduodenoscopy N/A 11/11/2012    Dr. Gala Romney- Barrett;s esophagus, esophageal varices, portal gastopathy. hiatal hernia    Prior to Admission medications   Medication Sig Start Date End Date Taking? Authorizing Provider  GLIPIZIDE XL 10 MG 24 hr tablet Take 10 mg by mouth daily.  02/24/12  Yes Historical Provider, MD  lisinopril (PRINIVIL,ZESTRIL) 10 MG tablet Take 10 mg by mouth daily.  04/09/12  Yes Historical Provider, MD  metFORMIN (GLUCOPHAGE) 1000 MG tablet Take 1,000 mg by mouth 2 (two) times daily with a meal.  02/09/12  Yes Historical Provider, MD  NEXIUM 40 MG capsule Take 40 mg by mouth daily before breakfast.  04/14/12  Yes Historical Provider, MD  propranolol (INDERAL) 10 MG tablet TAKE TWO (2) TABLETS BY MOUTH TWICE DAILY 09/04/14  Yes Mahala Menghini, PA-C  VICTOZA 18 MG/3ML SOLN 1.8 mg daily.  04/15/12  Yes Historical Provider, MD    Allergies as of 10/30/2014 - Review Complete 10/30/2014  Allergen Reaction Noted  . Codeine Nausea And Vomiting 04/19/2012    Family History  Problem Relation Age of Onset  .  Alzheimer's disease Mother   . CAD Father   . Diabetes Mellitus II Father   . Alzheimer's disease Father   . Diabetes Mellitus II Sister   . Cancer - Other Brother   . Diabetes Mellitus II Brother   . Hypertension Brother     History   Social History  . Marital Status: Married    Spouse Name: N/A  . Number of Children: N/A  . Years of Education: N/A   Occupational History  . Not on file.   Social History Main Topics  . Smoking status: Never Smoker     . Smokeless tobacco: Not on file  . Alcohol Use: No  . Drug Use: No  . Sexual Activity: Not on file   Other Topics Concern  . Not on file   Social History Narrative    Review of Systems: See HPI, otherwise negative ROS  Physical Exam: BP 129/66 mmHg  Pulse 98  Temp(Src) 97.1 F (36.2 C)  Ht 5' 1"  (1.549 m)  Wt 181 lb 6.4 oz (82.283 kg)  BMI 34.29 kg/m2 General:   Alert,  Well-developed, well-nourished, pleasant and cooperative in NAD. Accompanied by her husband. Skin:  Intact without significant lesions or rashes. Eyes:  Sclera clear, no icterus.   Conjunctiva pink. Ears:  Normal auditory acuity. Nose:  No deformity, discharge,  or lesions. Mouth:  No deformity or lesions. Neck:  Supple; no masses or thyromegaly. No significant cervical adenopathy. Lungs:  Clear throughout to auscultation.   No wheezes, crackles, or rhonchi. No acute distress. Heart:  Regular rate and rhythm; no murmurs, clicks, rubs,  or gallops. Abdomen: obese.  Non-distended, normal bowel sounds.  Soft and nontender without appreciable mass or hepatosplenomegaly. At no obvious ascites. Pulses:  Normal pulses noted. Extremities:  Without clubbing or edema.  Impression: Nash/cirrhosis.History of slightly depressed ceruloplasmin. Need MELD score determined. She is well compensated. History of Barrett's esophagus. GERD well-controlled on Nexium. History of high-grade colonic adenoma-due for surveillance colonoscopy at this time. Needs vaccination against hepatitis A and B. Needs screening hepatic ultrasound. Recommendations: Get hepatitis B and A as previously recommended  Continue Nexium and Inderal daily  Hepatic profile, BMET,INR,CBC, ceruloplasm today  Schedule a surveillance colonoscopy (history of adenoma) (hold victoza day before procedure and hold metformin evening prior to procedure)  Split movie prep  Screening hepatic ulrasound    Notice: This dictation was prepared with Dragon dictation  along with smaller phrase technology. Any transcriptional errors that result from this process are unintentional and may not be corrected upon review.

## 2014-10-30 NOTE — Patient Instructions (Addendum)
Get hepatitis B and A as previously recommended  Continue Nexium and Inderal daily  Hepatic profile, BMET,INR,CBC, ceruloplasm today  Schedule a surveillance colonoscopy (history of adenoma) (hold victoza day before procedure and hold metformin evening prior to procedure)  Split movie prep  Screening hepatic ulrasound

## 2014-11-02 ENCOUNTER — Ambulatory Visit (HOSPITAL_COMMUNITY)
Admission: RE | Admit: 2014-11-02 | Discharge: 2014-11-02 | Disposition: A | Discharge status: Home / Self Care | Payer: BC Managed Care – PPO | Source: Ambulatory Visit | Attending: Internal Medicine | Admitting: Internal Medicine

## 2014-11-02 DIAGNOSIS — K7460 Unspecified cirrhosis of liver: Secondary | ICD-10-CM

## 2014-11-06 NOTE — Progress Notes (Signed)
ON RECALL LIST FOR ULTRASOUND

## 2014-11-12 ENCOUNTER — Encounter (HOSPITAL_COMMUNITY)
Admission: RE | Disposition: A | Discharge status: Home / Self Care | Payer: Self-pay | Source: Ambulatory Visit | Attending: Internal Medicine

## 2014-11-12 ENCOUNTER — Encounter (HOSPITAL_COMMUNITY): Payer: Self-pay

## 2014-11-12 ENCOUNTER — Ambulatory Visit (HOSPITAL_COMMUNITY)
Admission: RE | Admit: 2014-11-12 | Discharge: 2014-11-12 | Disposition: A | Discharge status: Home / Self Care | Payer: BC Managed Care – PPO | Source: Ambulatory Visit | Attending: Internal Medicine | Admitting: Internal Medicine

## 2014-11-12 DIAGNOSIS — I10 Essential (primary) hypertension: Secondary | ICD-10-CM | POA: Insufficient documentation

## 2014-11-12 DIAGNOSIS — D125 Benign neoplasm of sigmoid colon: Secondary | ICD-10-CM | POA: Diagnosis not present

## 2014-11-12 DIAGNOSIS — K5730 Diverticulosis of large intestine without perforation or abscess without bleeding: Secondary | ICD-10-CM | POA: Insufficient documentation

## 2014-11-12 DIAGNOSIS — E119 Type 2 diabetes mellitus without complications: Secondary | ICD-10-CM | POA: Insufficient documentation

## 2014-11-12 DIAGNOSIS — Z8249 Family history of ischemic heart disease and other diseases of the circulatory system: Secondary | ICD-10-CM | POA: Diagnosis not present

## 2014-11-12 DIAGNOSIS — Z86010 Personal history of colonic polyps: Secondary | ICD-10-CM | POA: Insufficient documentation

## 2014-11-12 DIAGNOSIS — D124 Benign neoplasm of descending colon: Secondary | ICD-10-CM

## 2014-11-12 DIAGNOSIS — E785 Hyperlipidemia, unspecified: Secondary | ICD-10-CM | POA: Diagnosis not present

## 2014-11-12 DIAGNOSIS — Z9889 Other specified postprocedural states: Secondary | ICD-10-CM | POA: Diagnosis not present

## 2014-11-12 DIAGNOSIS — Z79899 Other long term (current) drug therapy: Secondary | ICD-10-CM | POA: Diagnosis not present

## 2014-11-12 DIAGNOSIS — K219 Gastro-esophageal reflux disease without esophagitis: Secondary | ICD-10-CM | POA: Insufficient documentation

## 2014-11-12 HISTORY — PX: COLONOSCOPY: SHX5424

## 2014-11-12 LAB — GLUCOSE, CAPILLARY: Glucose-Capillary: 184 mg/dL — ABNORMAL HIGH (ref 70–99)

## 2014-11-12 SURGERY — COLONOSCOPY
Anesthesia: Moderate Sedation

## 2014-11-12 MED ORDER — MEPERIDINE HCL 100 MG/ML IJ SOLN
INTRAMUSCULAR | Status: AC
  Filled 2014-11-12: qty 2

## 2014-11-12 MED ORDER — ONDANSETRON HCL 4 MG/2ML IJ SOLN
INTRAMUSCULAR | Status: DC | PRN
  Administered 2014-11-12: 4 mg via INTRAVENOUS

## 2014-11-12 MED ORDER — SIMETHICONE 40 MG/0.6ML PO SUSP
ORAL | Status: DC | PRN
  Administered 2014-11-12: 13:00:00

## 2014-11-12 MED ORDER — SODIUM CHLORIDE 0.9 % IV SOLN
INTRAVENOUS | Status: DC
  Administered 2014-11-12: 12:00:00 via INTRAVENOUS

## 2014-11-12 MED ORDER — MIDAZOLAM HCL 5 MG/5ML IJ SOLN
INTRAMUSCULAR | Status: DC | PRN
  Administered 2014-11-12: 2 mg via INTRAVENOUS
  Administered 2014-11-12: 1 mg via INTRAVENOUS

## 2014-11-12 MED ORDER — ONDANSETRON HCL 4 MG/2ML IJ SOLN
INTRAMUSCULAR | Status: AC
  Filled 2014-11-12: qty 2

## 2014-11-12 MED ORDER — MIDAZOLAM HCL 5 MG/5ML IJ SOLN
INTRAMUSCULAR | Status: AC
  Filled 2014-11-12: qty 10

## 2014-11-12 MED ORDER — MEPERIDINE HCL 100 MG/ML IJ SOLN
INTRAMUSCULAR | Status: DC | PRN
  Administered 2014-11-12: 50 mg via INTRAVENOUS

## 2014-11-12 NOTE — H&P (View-Only) (Signed)
Primary Care Physician:  Glenda Chroman., MD Primary Gastroenterologist:  Dr. Gala Romney  Pre-Procedure History & Physical: HPI:  Alisha [name] is a 64 y.o. female here for follow-up  of GERD, Barrett's esophagus, [name] cirrhosis, history of the high-grade adenomas in the past removed from colon. She's not yet been vaccinated against hepatitis A and B. Last ultrasound of the liver was 2014 ( cirrhosis and cholelithiasis). Has done well. Has received both parenteral iron and transfusion because of ongoing IDA. Marland Kitchen No dysphagia. No rectal bleeding or melena.  History of slightly depressed ceruloplasmin previously.  Prior evaluation at the [facility]. Lung lesion previously followed up with chest CT and PET and subsequent plain films all negative for malignancy.  Past Medical History  Diagnosis Date  . Cirrhosis of liver without mention of alcohol     hep B surface antigen and HCV ab negative.pt has not had hepatitis A and B vaccines.U/S on 07/04/13 shows cirrhosis.  . Esophageal reflux   . Iron deficiency anemia, unspecified   . Barrett's esophagus   . Type II or unspecified type diabetes mellitus without mention of complication, not stated as uncontrolled   . Unspecified hemorrhoids without mention of complication   . Unspecified essential hypertension   . Other and unspecified hyperlipidemia   . Tubular adenoma   . Esophageal varices   . Diverticula of colon     pancolonic  . Portal hypertensive gastropathy   . Hiatal hernia     Past Surgical History  Procedure Laterality Date  . Abdominal hysterectomy    . Cesarean section      x 2  . Hemorrhoid surgery  2008  . Esophagogastroduodenoscopy  08/2006    barretts esophagus, no dyplasia  . Esophagogastroduodenoscopy  03/2006    barretts esophagus,bx focal atypia c/w low grade dysplasia, SB bx negative for celiac  . Colonoscopy  03/2006    left sided diverticula, splenic flexure tublar adenoma  . Colonoscopy  07/2002   villous tubular adenoma in rectum  . Colonoscopy  09/23/09    external hemorrhoids/scattered pan colonic diverticula otherwise normal. cecal lipoma bx negative, normal TI. Next TCS 09/2014  . Esophagogastroduodenoscopy  09/23/09    3 columns of grade 2 esophageal varices/hiatal hernia/3-4 cm segment Barrett's esophagus without dysplasia. Next EGD 09/2012  . Small bowel capsule endoscopy  10/2009    normal  . Esophagogastroduodenoscopy N/A 11/11/2012    Dr. Gala Romney- Barrett;s esophagus, esophageal varices, portal gastopathy. hiatal hernia    Prior to Admission medications   Medication Sig Start Date End Date Taking? Authorizing Provider  GLIPIZIDE XL 10 MG 24 hr tablet Take 10 mg by mouth daily.  02/24/12  Yes Historical Provider, MD  lisinopril (PRINIVIL,ZESTRIL) 10 MG tablet Take 10 mg by mouth daily.  04/09/12  Yes Historical Provider, MD  metFORMIN (GLUCOPHAGE) 1000 MG tablet Take 1,000 mg by mouth 2 (two) times daily with a meal.  02/09/12  Yes Historical Provider, MD  NEXIUM 40 MG capsule Take 40 mg by mouth daily before breakfast.  04/14/12  Yes Historical Provider, MD  propranolol (INDERAL) 10 MG tablet TAKE TWO (2) TABLETS BY MOUTH TWICE DAILY 09/04/14  Yes Mahala Menghini, PA-C  VICTOZA 18 MG/3ML SOLN 1.8 mg daily.  04/15/12  Yes Historical Provider, MD    Allergies as of 10/30/2014 - Review Complete 10/30/2014  Allergen Reaction Noted  . Codeine Nausea And Vomiting 04/19/2012    Family History  Problem Relation Age of Onset  .  Alzheimer's disease Mother   . CAD Father   . Diabetes Mellitus II Father   . Alzheimer's disease Father   . Diabetes Mellitus II Sister   . Cancer - Other Brother   . Diabetes Mellitus II Brother   . Hypertension Brother     History   Social History  . Marital Status: Married    Spouse Name: N/A  . Number of Children: N/A  . Years of Education: N/A   Occupational History  . Not on file.   Social History Main Topics  . Smoking status: Never Smoker     . Smokeless tobacco: Not on file  . Alcohol Use: No  . Drug Use: No  . Sexual Activity: Not on file   Other Topics Concern  . Not on file   Social History Narrative    Review of Systems: See HPI, otherwise negative ROS  Physical Exam: BP 129/66 mmHg  Pulse 98  Temp(Src) 97.1 F (36.2 C)  Ht 5' 1"  (1.549 m)  Wt 181 lb 6.4 oz (82.283 kg)  BMI 34.29 kg/m2 General:   Alert,  Well-developed, well-nourished, pleasant and cooperative in NAD. Accompanied by her husband. Skin:  Intact without significant lesions or rashes. Eyes:  Sclera clear, no icterus.   Conjunctiva pink. Ears:  Normal auditory acuity. Nose:  No deformity, discharge,  or lesions. Mouth:  No deformity or lesions. Neck:  Supple; no masses or thyromegaly. No significant cervical adenopathy. Lungs:  Clear throughout to auscultation.   No wheezes, crackles, or rhonchi. No acute distress. Heart:  Regular rate and rhythm; no murmurs, clicks, rubs,  or gallops. Abdomen: obese.  Non-distended, normal bowel sounds.  Soft and nontender without appreciable mass or hepatosplenomegaly. At no obvious ascites. Pulses:  Normal pulses noted. Extremities:  Without clubbing or edema.  Impression: Nash/cirrhosis.History of slightly depressed ceruloplasmin. Need MELD score determined. She is well compensated. History of Barrett's esophagus. GERD well-controlled on Nexium. History of high-grade colonic adenoma-due for surveillance colonoscopy at this time. Needs vaccination against hepatitis A and B. Needs screening hepatic ultrasound. Recommendations: Get hepatitis B and A as previously recommended  Continue Nexium and Inderal daily  Hepatic profile, BMET,INR,CBC, ceruloplasm today  Schedule a surveillance colonoscopy (history of adenoma) (hold victoza day before procedure and hold metformin evening prior to procedure)  Split movie prep  Screening hepatic ulrasound    Notice: This dictation was prepared with Dragon dictation  along with smaller phrase technology. Any transcriptional errors that result from this process are unintentional and may not be corrected upon review.

## 2014-11-12 NOTE — Interval H&P Note (Signed)
History and Physical Interval Note:  11/12/2014 12:54 PM  Alisha [name]  has presented today for surgery, with the diagnosis of history of polpys  The various methods of treatment have been discussed with the patient and family. After consideration of risks, benefits and other options for treatment, the patient has consented to  Procedure(s) with comments: COLONOSCOPY (N/A) - 1215 - moved to 12:30 - Alisha [name] to notify pt as a surgical intervention .  The patient's history has been reviewed, patient examined, no change in status, stable for surgery.  I have reviewed the patient's chart and labs.  Questions were answered to the patient's satisfaction.     Alisha [name]  No change. Patient has not yet had recommended labs done through the office. Surveillance colonoscopy today per plan.The risks, benefits, limitations, alternatives and imponderables have been reviewed with the patient. Questions have been answered. All parties are agreeable.

## 2014-11-12 NOTE — Discharge Instructions (Addendum)
Colonoscopy Discharge Instructions  Read the instructions outlined below and refer to this sheet in the next few weeks. These discharge instructions provide you with general information on caring for yourself after you leave the hospital. Your doctor may also give you specific instructions. While your treatment has been planned according to the most current medical practices available, unavoidable complications occasionally occur. If you have any problems or questions after discharge, call Dr. Gala Romney at [phone]. ACTIVITY  You may resume your regular activity, but move at a slower pace for the next 24 hours.   Take frequent rest periods for the next 24 hours.   Walking will help get rid of the air and reduce the bloated feeling in your belly (abdomen).   No driving for 24 hours (because of the medicine (anesthesia) used during the test).    Do not sign any important legal documents or operate any machinery for 24 hours (because of the anesthesia used during the test).  NUTRITION  Drink plenty of fluids.   You may resume your normal diet as instructed by your doctor.   Begin with a light meal and progress to your normal diet. Heavy or fried foods are harder to digest and may make you feel sick to your stomach (nauseated).   Avoid alcoholic beverages for 24 hours or as instructed.  MEDICATIONS  You may resume your normal medications unless your doctor tells you otherwise.  WHAT YOU CAN EXPECT TODAY  Some feelings of bloating in the abdomen.   Passage of more gas than usual.   Spotting of blood in your stool or on the toilet paper.  IF YOU HAD POLYPS REMOVED DURING THE COLONOSCOPY:  No aspirin products for 7 days or as instructed.   No alcohol for 7 days or as instructed.   Eat a soft diet for the next 24 hours.  FINDING OUT THE RESULTS OF YOUR TEST Not all test results are available during your visit. If your test results are not back during the visit, make an appointment  with your caregiver to find out the results. Do not assume everything is normal if you have not heard from your caregiver or the medical facility. It is important for you to follow up on all of your test results.  SEEK IMMEDIATE MEDICAL ATTENTION IF:  You have more than a spotting of blood in your stool.   Your belly is swollen (abdominal distention).   You are nauseated or vomiting.   You have a temperature over 101.   You have abdominal pain or discomfort that is severe or gets worse throughout the day.    Colon Polyps Polyps are lumps of extra tissue growing inside the body. Polyps can grow in the large intestine (colon). Most colon polyps are noncancerous (benign). However, some colon polyps can become cancerous over time. Polyps that are larger than a pea may be harmful. To be safe, caregivers remove and test all polyps. CAUSES  Polyps form when mutations in the genes cause your cells to grow and divide even though no more tissue is needed. RISK FACTORS There are a number of risk factors that can increase your chances of getting colon polyps. They include:  Being older than 50 years.  Family history of colon polyps or colon cancer.  Long-term colon diseases, such as colitis or Crohn disease.  Being overweight.  Smoking.  Being inactive.  Drinking too much alcohol. SYMPTOMS  Most small polyps do not cause symptoms. If symptoms are present, they may  include:  Blood in the stool. The stool may look dark red or black.  Constipation or diarrhea that lasts longer than 1 week. DIAGNOSIS People often do not know they have polyps until their caregiver finds them during a regular checkup. Your caregiver can use 4 tests to check for polyps:  Digital rectal exam. The caregiver wears gloves and feels inside the rectum. This test would find polyps only in the rectum.  Barium enema. The caregiver puts a liquid called barium into your rectum before taking X-rays of your colon.  Barium makes your colon look Sanvi Ehler. Polyps are dark, so they are easy to see in the X-ray pictures.  Sigmoidoscopy. A thin, flexible tube (sigmoidoscope) is placed into your rectum. The sigmoidoscope has a light and tiny camera in it. The caregiver uses the sigmoidoscope to look at the last third of your colon.  Colonoscopy. This test is like sigmoidoscopy, but the caregiver looks at the entire colon. This is the most common method for finding and removing polyps. TREATMENT  Any polyps will be removed during a sigmoidoscopy or colonoscopy. The polyps are then tested for cancer. PREVENTION  To help lower your risk of getting more colon polyps:  Eat plenty of fruits and vegetables. Avoid eating fatty foods.  Do not smoke.  Avoid drinking alcohol.  Exercise every day.  Lose weight if recommended by your caregiver.  Eat plenty of calcium and folate. Foods that are rich in calcium include milk, cheese, and broccoli. Foods that are rich in folate include chickpeas, kidney beans, and spinach. HOME CARE INSTRUCTIONS Keep all follow-up appointments as directed by your caregiver. You may need periodic exams to check for polyps. SEEK MEDICAL CARE IF: You notice bleeding during a bowel movement. Document Released: 05/27/2004 Document Revised: 11/23/2011 Document Reviewed: 11/10/2011 [facility] Patient Information 2015 [place], [place]. This information is not intended to replace advice given to you by your health care provider. Make sure you discuss any questions you have with your health care provider.  Diverticulosis Diverticulosis is the condition that develops when small pouches (diverticula) form in the wall of your colon. Your colon, or large intestine, is where water is absorbed and stool is formed. The pouches form when the inside layer of your colon pushes through weak spots in the outer layers of your colon. CAUSES  No one knows exactly what causes diverticulosis. RISK FACTORS  Being  older than 35. Your risk for this condition increases with age. Diverticulosis is rare in people younger than 40 years. By age 75, almost everyone has it.  Eating a low-fiber diet.  Being frequently constipated.  Being overweight.  Not getting enough exercise.  Smoking.  Taking over-the-counter pain medicines, like aspirin and ibuprofen. SYMPTOMS  Most people with diverticulosis do not have symptoms. DIAGNOSIS  Because diverticulosis often has no symptoms, health care providers often discover the condition during an exam for other colon problems. In many cases, a health care provider will diagnose diverticulosis while using a flexible scope to examine the colon (colonoscopy). TREATMENT  If you have never developed an infection related to diverticulosis, you may not need treatment. If you have had an infection before, treatment may include:  Eating more fruits, vegetables, and grains.  Taking a fiber supplement.  Taking a live bacteria supplement (probiotic).  Taking medicine to relax your colon. HOME CARE INSTRUCTIONS   Drink at least 6-8 glasses of water each day to prevent constipation.  Try not to strain when you have a bowel movement.  Keep all follow-up appointments. If you have had an infection before:  Increase the fiber in your diet as directed by your health care provider or dietitian.  Take a dietary fiber supplement if your health care provider approves.  Only take medicines as directed by your health care provider. SEEK MEDICAL CARE IF:   You have abdominal pain.  You have bloating.  You have cramps.  You have not gone to the bathroom in 3 days. SEEK IMMEDIATE MEDICAL CARE IF:   Your pain gets worse.  Yourbloating becomes very bad.  You have a fever or chills, and your symptoms suddenly get worse.  You begin vomiting.  You have bowel movements that are bloody or black. MAKE SURE YOU:  Understand these instructions.  Will watch your  condition.  Will get help right away if you are not doing well or get worse. Document Released: 05/28/2004 Document Revised: 09/05/2013 Document Reviewed: 07/26/2013 [facility] Patient Information 2015 [place], [place]. This information is not intended to replace advice given to you by your health care provider. Make sure you discuss any questions you have with your health care provider.   Polyp and diverticulosis information provided  Further recommendations to follow pending review of pathology report  Please get lab work completed which was previously recommended

## 2014-11-12 NOTE — Op Note (Signed)
[facility] [address] [place], [address]   COLONOSCOPY PROCEDURE REPORT  PATIENT: Alisha [name], Alisha [name]  MR#: [id number] BIRTHDATE: 20-Sep-1950 , 1  yrs. old GENDER: female ENDOSCOPIST: R.  Garfield Cornea, MD FACP [facility] REFERRED OZ:[id number] Manuella Ghazi, M.D. PROCEDURE DATE:  12-07-2014 PROCEDURE:   Colonoscopy with snare polypectomy INDICATIONS:   Surveillance examinations; history of advanced adenoma. MEDICATIONS: Versed 3 mg IV and Demerol 50 mg IV in divided doses. Zofran 4 mg IV. ASA CLASS:       Class III  CONSENT: The risks, benefits, alternatives and imponderables including but not limited to bleeding, perforation as well as the possibility of a missed lesion have been reviewed.  The potential for biopsy, lesion removal, etc. have also been discussed. Questions have been answered.  All parties agreeable.  Please see the history and physical in the medical record for more information.  DESCRIPTION OF PROCEDURE:   After the risks benefits and alternatives of the procedure were thoroughly explained, informed consent was obtained.  The digital rectal exam revealed no abnormalities of the rectum.   The [id number] ([id number])  endoscope was introduced through the anus and advanced to the cecum, which was identified by both the appendix and ileocecal valve. No adverse events experienced.   The quality of the prep was adequate  The instrument was then slowly withdrawn as the colon was fully examined.      COLON FINDINGS: Normal-appearing rectal mucosa.  Left-sided transverse diverticula; (1) 4 mm polyp in the mid descending segment and (1) 6 mm sessile polyp in the sigmoid segment; otherwise, the remainder of the colonic mucosa appeared normal. The above-mentioned polyps were cold and hot snared, respectively. Retroflexion was performed. .  Withdrawal time=16 minutes 0 seconds.  The scope was withdrawn and the procedure completed. COMPLICATIONS: There were no  immediate complications.  ENDOSCOPIC IMPRESSION: Colonic diverticulosis. Colonic polyps?"removed as described above.  RECOMMENDATIONS: Follow-up on pathology. Patient is asked to follow through on getting lab work recommended during the office visit recently which has not yet been completed.  eSigned:  R. Garfield Cornea, MD [facility] [employer] December 07, 2014 1:29 PM   cc:  CPT CODES: ICD CODES:  The ICD and CPT codes recommended by this software are interpretations from the data that the clinical staff has captured with the software.  The verification of the translation of this report to the ICD and CPT codes and modifiers is the sole responsibility of the health care institution and practicing physician where this report was generated.  [employer] will not be held responsible for the validity of the ICD and CPT codes included on this report.  AMA assumes no liability for data contained or not contained herein. CPT is a Designer, television/film set of the [employer].  PATIENT NAME:  Alisha [name], Alisha [name] MR#: [id number]

## 2014-11-13 ENCOUNTER — Encounter (HOSPITAL_COMMUNITY): Payer: Self-pay | Admitting: Internal Medicine

## 2014-11-14 ENCOUNTER — Encounter: Payer: Self-pay | Admitting: Internal Medicine

## 2015-03-04 ENCOUNTER — Other Ambulatory Visit: Payer: Self-pay | Admitting: Gastroenterology

## 2015-03-20 ENCOUNTER — Telehealth: Payer: Self-pay | Admitting: Internal Medicine

## 2015-03-20 NOTE — Telephone Encounter (Signed)
Letter mailed for her to call us

## 2015-03-20 NOTE — Telephone Encounter (Signed)
Pt is on the Aug recall list for a 6 month U/S ABD

## 2015-08-02 ENCOUNTER — Other Ambulatory Visit: Payer: Self-pay | Admitting: Nurse Practitioner

## 2015-10-31 ENCOUNTER — Encounter: Payer: Self-pay | Admitting: Internal Medicine

## 2016-03-02 ENCOUNTER — Other Ambulatory Visit: Payer: Self-pay | Admitting: Nurse Practitioner

## 2016-04-08 ENCOUNTER — Ambulatory Visit (INDEPENDENT_AMBULATORY_CARE_PROVIDER_SITE_OTHER): Payer: BC Managed Care – PPO | Admitting: Gastroenterology

## 2016-04-08 ENCOUNTER — Other Ambulatory Visit: Payer: Self-pay

## 2016-04-08 ENCOUNTER — Encounter: Payer: Self-pay | Admitting: Gastroenterology

## 2016-04-08 VITALS — BP 117/56 | HR 77 | Temp 98.3°F | Ht 61.0 in | Wt 177.0 lb

## 2016-04-08 DIAGNOSIS — D509 Iron deficiency anemia, unspecified: Secondary | ICD-10-CM

## 2016-04-08 DIAGNOSIS — I8500 Esophageal varices without bleeding: Secondary | ICD-10-CM | POA: Insufficient documentation

## 2016-04-08 DIAGNOSIS — K2270 Barrett's esophagus without dysplasia: Secondary | ICD-10-CM

## 2016-04-08 DIAGNOSIS — K7460 Unspecified cirrhosis of liver: Secondary | ICD-10-CM | POA: Diagnosis not present

## 2016-04-08 DIAGNOSIS — I8510 Secondary esophageal varices without bleeding: Secondary | ICD-10-CM | POA: Diagnosis not present

## 2016-04-08 NOTE — Progress Notes (Signed)
Primary Care Physician:  Glenda Chroman, MD  Primary Gastroenterologist:  Garfield Cornea, MD   Chief Complaint  Patient presents with  . Other    Time for EGD    HPI:  Alisha [name] is a 65 y.o. female here for follow up. She was last seen in 10/2014. She has h/o GERD, Barrett's esophagus, NASH cirrhosis, high-grade adenomas in the past removed from colon. She has required parenteral iron and transfusion in the past for IDA. She has been evaluated at [facility] in the past for her NASH cirrhosis with no plans for follow up at this time due to compensated liver disease. Last TCS 10/2014 with removal of tubular adenomas, next TCS 10/2019. Last EGD 11/2012, barrett's with no dysplasia, due for EGD now.   She is here for follow up. She is due for labs and u/s at this time. She has received 2 of 3 hep b vaccines, goes for final shot soon. She complains of postprandial loose stools for years. Manageable and not concerned. No melena, brbpr. Denies abd pain, heartburn, vomiting, unintentional weight loss. Mild lower ext edema improves after elevation at night. .  Current Outpatient Prescriptions  Medication Sig Dispense Refill  . GLIPIZIDE XL 10 MG 24 hr tablet Take 10 mg by mouth daily.     Marland Kitchen lisinopril (PRINIVIL,ZESTRIL) 10 MG tablet Take 10 mg by mouth daily.     . metFORMIN (GLUCOPHAGE) 1000 MG tablet Take 1,000 mg by mouth 2 (two) times daily with a meal.     . NEXIUM 40 MG capsule Take 40 mg by mouth daily before breakfast.     . propranolol (INDERAL) 10 MG tablet TAKE TWO (2) TABLETS BY MOUTH TWICE DAILY. 120 tablet 3  . VICTOZA 18 MG/3ML SOLN 1.8 mg daily.      No current facility-administered medications for this visit.     Allergies as of 04/08/2016 - Review Complete 04/08/2016  Allergen Reaction Noted  . Codeine Nausea And Vomiting 04/19/2012    Past Medical History:  Diagnosis Date  . Barrett's esophagus   . Cirrhosis of liver without mention of alcohol    hep B surface antigen and  HCV ab negative.pt has not had hepatitis A and B vaccines.U/S on 07/04/13 shows cirrhosis.  . Diverticula of colon    pancolonic  . Esophageal reflux   . Esophageal varices ([place])   . Hiatal hernia   . Iron deficiency anemia, unspecified   . Other and unspecified hyperlipidemia   . Portal hypertensive gastropathy   . Tubular adenoma   . Type II or unspecified type diabetes mellitus without mention of complication, not stated as uncontrolled   . Unspecified essential hypertension   . Unspecified hemorrhoids without mention of complication     Past Surgical History:  Procedure Laterality Date  . ABDOMINAL HYSTERECTOMY    . CESAREAN SECTION     x 2  . COLONOSCOPY  03/2006   left sided diverticula, splenic flexure tublar adenoma  . COLONOSCOPY  07/2002   villous tubular adenoma in rectum  . COLONOSCOPY  09/23/09   external hemorrhoids/scattered pan colonic diverticula otherwise normal. cecal lipoma bx negative, normal TI. Next TCS 09/2014  . COLONOSCOPY N/A 11/12/2014   Procedure: COLONOSCOPY;  Surgeon: Daneil Dolin, MD;  Location: [facility];  Service: Endoscopy;  Laterality: N/A;  1215 - moved to 12:30 - Ginger to notify pt  . ESOPHAGOGASTRODUODENOSCOPY  08/2006   barretts esophagus, no dyplasia  . ESOPHAGOGASTRODUODENOSCOPY  03/2006  barretts esophagus,bx focal atypia c/w low grade dysplasia, SB bx negative for celiac  . ESOPHAGOGASTRODUODENOSCOPY  09/23/09   3 columns of grade 2 esophageal varices/hiatal hernia/3-4 cm segment Barrett's esophagus without dysplasia. Next EGD 09/2012  . ESOPHAGOGASTRODUODENOSCOPY N/A 11/11/2012   Dr. Gala Romney- Barrett;s esophagus, esophageal varices, portal gastopathy. hiatal hernia  . [place]  2008  . small bowel capsule endoscopy  10/2009   normal    Family History  Problem Relation Age of Onset  . Alzheimer's disease Mother   . CAD Father   . Diabetes Mellitus II Father   . Alzheimer's disease Father   . Diabetes Mellitus II Sister    . Cancer - Other Brother   . Diabetes Mellitus II Brother   . Hypertension Brother     Social History   Social History  . Marital status: Married    Spouse name: N/A  . Number of children: N/A  . Years of education: N/A   Occupational History  . Not on file.   Social History Main Topics  . Smoking status: Never Smoker  . Smokeless tobacco: Not on file  . Alcohol use No  . Drug use: No  . Sexual activity: Yes   Other Topics Concern  . Not on file   Social History Narrative  . No narrative on file      ROS:  General: Negative for anorexia, weight loss, fever, chills, fatigue, weakness. Eyes: Negative for vision changes.  ENT: Negative for hoarseness, difficulty swallowing , nasal congestion. CV: Negative for chest pain, angina, palpitations, dyspnea on exertion, peripheral edema.  Respiratory: Negative for dyspnea at rest, dyspnea on exertion, cough, sputum, wheezing.  GI: See history of present illness. GU:  Negative for dysuria, hematuria, urinary incontinence, urinary frequency, nocturnal urination.  MS: Negative for joint pain, low back pain.  Derm: Negative for rash or itching.  Neuro: Negative for weakness, abnormal sensation, seizure, frequent headaches, memory loss, confusion.  Psych: Negative for anxiety, depression, suicidal ideation, hallucinations.  Endo: Negative for unusual weight change.  Heme: Negative for bruising or bleeding. Allergy: Negative for rash or hives.    Physical Examination:  BP (!) 117/56   Pulse 77   Temp 98.3 F (36.8 C) (Oral)   Ht 5' 1"  (1.549 m)   Wt 177 lb (80.3 kg)   BMI 33.44 kg/m    General: Well-nourished, well-developed in no acute distress.  Head: Normocephalic, atraumatic.   Eyes: Conjunctiva pink, no icterus. Mouth: Oropharyngeal mucosa moist and pink , no lesions erythema or exudate. Neck: Supple without thyromegaly, masses, or lymphadenopathy.  Lungs: Clear to auscultation bilaterally.  Heart: Regular  rate and rhythm, no murmurs rubs or gallops.  Abdomen: Bowel sounds are normal, nontender, nondistended, no hepatosplenomegaly or masses, no abdominal bruits or    hernia , no rebound or guarding.   Rectal: not performed Extremities: trace pedal lower extremity edema. No clubbing or deformities.  Neuro: Alert and oriented x 4 , grossly normal neurologically.  Skin: Warm and dry, no rash or jaundice.   Psych: Alert and cooperative, normal mood and affect.  Labs: No recent labs  Imaging Studies: No results found.  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 She is due for surveillance EGD at this time.  I have discussed the risks, alternatives, benefits with regards to but not limited to the risk of reaction to medication, bleeding, infection, perforation and the patient  is agreeable to proceed. Written consent to be obtained.  Will also update labs and abd u/s.

## 2016-04-08 NOTE — Patient Instructions (Signed)
1. Continue Nexium as before.  2. Upper endoscopy as scheduled. See separate instructions.  3. Abdominal ultrasound to follow up on your liver. 4. Please have your labs done.

## 2016-04-09 ENCOUNTER — Other Ambulatory Visit: Payer: Self-pay

## 2016-04-10 NOTE — Progress Notes (Signed)
cc'ed to pcp °

## 2016-04-28 ENCOUNTER — Encounter (HOSPITAL_COMMUNITY): Payer: Self-pay | Admitting: *Deleted

## 2016-04-30 ENCOUNTER — Encounter (HOSPITAL_COMMUNITY)
Admission: RE | Disposition: A | Discharge status: Home / Self Care | Payer: Self-pay | Source: Ambulatory Visit | Attending: Internal Medicine

## 2016-04-30 ENCOUNTER — Ambulatory Visit (HOSPITAL_COMMUNITY)
Admission: RE | Admit: 2016-04-30 | Discharge: 2016-04-30 | Disposition: A | Discharge status: Home / Self Care | Payer: BC Managed Care – PPO | Source: Ambulatory Visit | Attending: Internal Medicine | Admitting: Internal Medicine

## 2016-04-30 ENCOUNTER — Encounter (HOSPITAL_COMMUNITY): Payer: Self-pay

## 2016-04-30 DIAGNOSIS — I8500 Esophageal varices without bleeding: Secondary | ICD-10-CM | POA: Insufficient documentation

## 2016-04-30 DIAGNOSIS — Z7984 Long term (current) use of oral hypoglycemic drugs: Secondary | ICD-10-CM | POA: Diagnosis not present

## 2016-04-30 DIAGNOSIS — K2270 Barrett's esophagus without dysplasia: Secondary | ICD-10-CM

## 2016-04-30 DIAGNOSIS — K449 Diaphragmatic hernia without obstruction or gangrene: Secondary | ICD-10-CM | POA: Diagnosis not present

## 2016-04-30 DIAGNOSIS — K766 Portal hypertension: Secondary | ICD-10-CM | POA: Insufficient documentation

## 2016-04-30 DIAGNOSIS — D509 Iron deficiency anemia, unspecified: Secondary | ICD-10-CM

## 2016-04-30 DIAGNOSIS — E119 Type 2 diabetes mellitus without complications: Secondary | ICD-10-CM | POA: Insufficient documentation

## 2016-04-30 DIAGNOSIS — Z79899 Other long term (current) drug therapy: Secondary | ICD-10-CM | POA: Diagnosis not present

## 2016-04-30 DIAGNOSIS — K7581 Nonalcoholic steatohepatitis (NASH): Secondary | ICD-10-CM | POA: Insufficient documentation

## 2016-04-30 DIAGNOSIS — K219 Gastro-esophageal reflux disease without esophagitis: Secondary | ICD-10-CM | POA: Diagnosis not present

## 2016-04-30 DIAGNOSIS — K3189 Other diseases of stomach and duodenum: Secondary | ICD-10-CM | POA: Diagnosis not present

## 2016-04-30 DIAGNOSIS — I10 Essential (primary) hypertension: Secondary | ICD-10-CM | POA: Insufficient documentation

## 2016-04-30 DIAGNOSIS — K7469 Other cirrhosis of liver: Secondary | ICD-10-CM | POA: Insufficient documentation

## 2016-04-30 DIAGNOSIS — Z86010 Personal history of colonic polyps: Secondary | ICD-10-CM | POA: Diagnosis not present

## 2016-04-30 HISTORY — PX: BIOPSY: SHX5522

## 2016-04-30 HISTORY — PX: ESOPHAGOGASTRODUODENOSCOPY: SHX5428

## 2016-04-30 LAB — GLUCOSE, CAPILLARY: Glucose-Capillary: 191 mg/dL — ABNORMAL HIGH (ref 65–99)

## 2016-04-30 SURGERY — EGD (ESOPHAGOGASTRODUODENOSCOPY)
Anesthesia: Moderate Sedation

## 2016-04-30 MED ORDER — LIDOCAINE VISCOUS 2 % MT SOLN
OROMUCOSAL | Status: DC | PRN
  Administered 2016-04-30: 3 mL via OROMUCOSAL

## 2016-04-30 MED ORDER — MIDAZOLAM HCL 5 MG/5ML IJ SOLN
INTRAMUSCULAR | Status: DC | PRN
  Administered 2016-04-30: 1 mg via INTRAVENOUS
  Administered 2016-04-30: 2 mg via INTRAVENOUS

## 2016-04-30 MED ORDER — LIDOCAINE VISCOUS 2 % MT SOLN
OROMUCOSAL | Status: AC
  Filled 2016-04-30: qty 15

## 2016-04-30 MED ORDER — ONDANSETRON HCL 4 MG/2ML IJ SOLN
INTRAMUSCULAR | Status: AC
  Filled 2016-04-30: qty 2

## 2016-04-30 MED ORDER — MEPERIDINE HCL 100 MG/ML IJ SOLN
INTRAMUSCULAR | Status: DC
Start: 2016-04-30 — End: 2016-04-30
  Filled 2016-04-30: qty 2

## 2016-04-30 MED ORDER — STERILE WATER FOR IRRIGATION IR SOLN
Status: DC | PRN
  Administered 2016-04-30: 08:00:00

## 2016-04-30 MED ORDER — MEPERIDINE HCL 100 MG/ML IJ SOLN
INTRAMUSCULAR | Status: DC | PRN
  Administered 2016-04-30: 50 mg via INTRAVENOUS
  Administered 2016-04-30: 25 mg via INTRAVENOUS

## 2016-04-30 MED ORDER — SODIUM CHLORIDE 0.9 % IV SOLN
INTRAVENOUS | Status: DC
  Administered 2016-04-30: 08:00:00 via INTRAVENOUS

## 2016-04-30 MED ORDER — MIDAZOLAM HCL 5 MG/5ML IJ SOLN
INTRAMUSCULAR | Status: AC
  Filled 2016-04-30: qty 10

## 2016-04-30 MED ORDER — ONDANSETRON HCL 4 MG/2ML IJ SOLN
INTRAMUSCULAR | Status: DC | PRN
  Administered 2016-04-30: 4 mg via INTRAVENOUS

## 2016-04-30 NOTE — Op Note (Signed)
[facility] Patient Name: Alisha [name] Procedure Date: 04/30/2016 8:16 AM MRN: [id number] Date of Birth: 1950/10/22 Attending MD: Norvel Richards , MD CSN: [id number] Age: 65 Admit Type: Outpatient Procedure:                Upper GI endoscopy with esophageal biopsy Indications:              Surveillance for malignancy due to personal history                            of Barrett's esophagus Providers:                Norvel Richards, MD, [place]is Peak B. Sharon Seller, RN,                            Purcell Nails. Lemons, Technician Referring MD:              Medicines:                Meperidine 75 mg IV, Midazolam 3 mg IV Complications:            No immediate complications. Estimated Blood Loss:     Estimated blood loss was minimal. Procedure:                Pre-Anesthesia Assessment:                           - Prior to the procedure, a History and Physical                            was performed, and patient medications and                            allergies were reviewed. The patient's tolerance of                            previous anesthesia was also reviewed. The risks                            and benefits of the procedure and the sedation                            options and risks were discussed with the patient.                            All questions were answered, and informed consent                            was obtained. Prior Anticoagulants: The patient has                            taken no previous anticoagulant or antiplatelet                            agents. ASA Grade Assessment: II - A patient with  mild systemic disease. After reviewing the risks                            and benefits, the patient was deemed in                            satisfactory condition to undergo the procedure.                           After obtaining informed consent, the endoscope was                            passed under direct vision.  Throughout the                            procedure, the patient's blood pressure, pulse, and                            oxygen saturations were monitored continuously. The                            [id number] ([id number]) scope was introduced through the                            mouth, and advanced to the second part of duodenum.                            The upper GI endoscopy was accomplished without                            difficulty. The patient tolerated the procedure                            well. Scope In: 8:30:57 AM Scope Out: 8:35:11 AM Total Procedure Duration: 0 hours 4 minutes 14 seconds  Findings:      Grade II-III esophageal varices were found in the lower third of the       esophagus. 2 "tongues" of salmon-colored epithelium coming up 3 cm from       the GE junction. No nodularity. No esophagitis.      A small hiatal hernia was present. Portal gastropathy present. No       gastric varices.      The duodenal bulb and second portion of the duodenum were normal. A       single biopsy of salmon-cothelium distal esophagus taken between 2       variceal columns. Impression:               Abnormal distal esophagus consistent with known                            Barrett's?"status post biopsy                           portal gastropathy  Heart rate has not been at target on current dose                            of propranolol. It hovered in the 70s throughout                            her stay at the hospital today                           - Grade II esophageal varices.                           - Small hiatal hernia.                           - Normal duodenal bulb and second portion of the                            duodenum.                           - No specimens collected. Moderate Sedation:      Moderate (conscious) sedation was administered by the endoscopy nurse       and supervised by the endoscopist. The following parameters were        monitored: oxygen saturation, heart rate, blood pressure, respiratory       rate, EKG, adequacy of pulmonary ventilation, and response to care.       Total physician intraservice time was 13 minutes. Recommendation:           - Patient has a contact number available for                            emergencies. The signs and symptoms of potential                            delayed complications were discussed with the                            patient. Return to normal activities tomorrow.                            Written discharge instructions were provided to the                            patient.                           - Advance diet as tolerated today.                           - Continue present medications except increase                            propranolol to 30 mg in the morning and continue 20  mg in the evening. In the presence of enlarging                            varices, I feel future biopsies of the esophagus                            may not be worth the risk. Continue acid                            suppression therapy.                           - Await pathology results. Office visit in one month Procedure Code(s):        --- Professional ---                           [id number], Esophagogastroduodenoscopy, flexible,                            transoral; diagnostic, including collection of                            specimen(s) by brushing or washing, when performed                            (separate procedure)                           99152, Moderate sedation services provided by the                            same physician or other qualified health care                            professional performing the diagnostic or                            therapeutic service that the sedation supports,                            requiring the presence of an independent trained                            observer to assist in the  monitoring of the                            patient's level of consciousness and physiological                            status; initial 15 minutes of intraservice time,                            patient age 47 years or older Diagnosis Code(s):        --- Professional ---  I85.00, Esophageal varices without bleeding                           K44.9, Diaphragmatic hernia without obstruction or                            gangrene                           K22.70, Barrett's esophagus without dysplasia CPT copyright 2016 [employer]. All rights reserved. The codes documented in this report are preliminary and upon coder review may  be revised to meet current compliance requirements. Cristopher Estimable. Andreus Cure, MD Norvel Richards, MD 04/30/2016 8:55:00 AM This report has been signed electronically. Number of Addenda: 0

## 2016-04-30 NOTE — Interval H&P Note (Signed)
History and Physical Interval Note:  04/30/2016 8:19 AM  Alisha [name]  has presented today for surgery, with the diagnosis of IDA/BARRETTS ESOPHAG  The various methods of treatment have been discussed with the patient and family. After consideration of risks, benefits and other options for treatment, the patient has consented to  Procedure(s) with comments: ESOPHAGOGASTRODUODENOSCOPY (EGD) (N/A) - 815 as a surgical intervention .  The patient's history has been reviewed, patient examined, no change in status, stable for surgery.  I have reviewed the patient's chart and labs.  Questions were answered to the patient's satisfaction.     Alisha [name]  No change. Surveillance EGD per plan.The risks, benefits, limitations, alternatives and imponderables have been reviewed with the patient. Potential for esophageal dilation, biopsy, etc. have also been reviewed.  Questions have been answered. All parties agreeable.

## 2016-04-30 NOTE — Discharge Instructions (Addendum)
EGD Discharge instructions Please read the instructions outlined below and refer to this sheet in the next few weeks. These discharge instructions provide you with general information on caring for yourself after you leave the hospital. Your doctor may also give you specific instructions. While your treatment has been planned according to the most current medical practices available, unavoidable complications occasionally occur. If you have any problems or questions after discharge, please call your doctor. ACTIVITY  You may resume your regular activity but move at a slower pace for the next 24 hours.   Take frequent rest periods for the next 24 hours.   Walking will help expel (get rid of) the air and reduce the bloated feeling in your abdomen.   No driving for 24 hours (because of the anesthesia (medicine) used during the test).   You may shower.   Do not sign any important legal documents or operate any machinery for 24 hours (because of the anesthesia used during the test).  NUTRITION  Drink plenty of fluids.   You may resume your normal diet.   Begin with a light meal and progress to your normal diet.   Avoid alcoholic beverages for 24 hours or as instructed by your caregiver.  MEDICATIONS  You may resume your normal medications unless your caregiver tells you otherwise.  WHAT YOU CAN EXPECT TODAY  You may experience abdominal discomfort such as a feeling of fullness or gas pains.  FOLLOW-UP  Your doctor will discuss the results of your test with you.  SEEK IMMEDIATE MEDICAL ATTENTION IF ANY OF THE FOLLOWING OCCUR:  Excessive nausea (feeling sick to your stomach) and/or vomiting.   Severe abdominal pain and distention (swelling).   Trouble swallowing.   Temperature over 101 F (37.8 C).   Rectal bleeding or vomiting of blood.    Increase propranolol to 30 mg in the morning and 20 mg in the evening  GERD information provided  Office visit with [name] in one month   Sept 18th @ 11:00 with Neil Crouch  Further recommendations to follow pending review of pathology report   Gastroesophageal Reflux Disease, Adult Normally, food travels down the esophagus and stays in the stomach to be digested. However, when a person has gastroesophageal reflux disease (GERD), food and stomach acid move back up into the esophagus. When this happens, the esophagus becomes sore and inflamed. Over time, GERD can create small holes (ulcers) in the lining of the esophagus.  CAUSES This condition is caused by a problem with the muscle between the esophagus and the stomach (lower esophageal sphincter, or LES). Normally, the LES muscle closes after food passes through the esophagus to the stomach. When the LES is weakened or abnormal, it does not close properly, and that allows food and stomach acid to go back up into the esophagus. The LES can be weakened by certain dietary substances, medicines, and medical conditions, including:  Tobacco use.  Pregnancy.  Having a hiatal hernia.  Heavy alcohol use.  Certain foods and beverages, such as coffee, chocolate, onions, and peppermint. RISK FACTORS This condition is more likely to develop in:  People who have an increased body weight.  People who have connective tissue disorders.  People who use NSAID medicines. SYMPTOMS Symptoms of this condition include:  Heartburn.  Difficult or painful swallowing.  The feeling of having a lump in the throat.  Abitter taste in the mouth.  Bad breath.  Having a large amount of saliva.  Having an upset or bloated stomach.  Belching.  Chest pain.  Shortness of breath or wheezing.  Ongoing (chronic) cough or a night-time cough.  Wearing away of tooth enamel.  Weight loss. Different conditions can cause chest pain. Make sure to see your health care provider if you experience chest pain. DIAGNOSIS Your health care provider will take a medical history and perform a physical  exam. To determine if you have mild or severe GERD, your health care provider may also monitor how you respond to treatment. You may also have other tests, including:  An endoscopy toexamine your stomach and esophagus with a small camera.  A test thatmeasures the acidity level in your esophagus.  A test thatmeasures how much pressure is on your esophagus.  A barium swallow or modified barium swallow to show the shape, size, and functioning of your esophagus. TREATMENT The goal of treatment is to help relieve your symptoms and to prevent complications. Treatment for this condition may vary depending on how severe your symptoms are. Your health care provider may recommend:  Changes to your diet.  Medicine.  Surgery. HOME CARE INSTRUCTIONS Diet  Follow a diet as recommended by your health care provider. This may involve avoiding foods and drinks such as:  Coffee and tea (with or without caffeine).  Drinks that containalcohol.  Energy drinks and sports drinks.  Carbonated drinks or sodas.  Chocolate and cocoa.  Peppermint and mint flavorings.  Garlic and onions.  Horseradish.  Spicy and acidic foods, including peppers, chili powder, curry powder, vinegar, hot sauces, and barbecue sauce.  Citrus fruit juices and citrus fruits, such as oranges, lemons, and limes.  Tomato-based foods, such as red sauce, chili, salsa, and pizza with red sauce.  Fried and fatty foods, such as donuts, french fries, potato chips, and high-fat dressings.  High-fat meats, such as hot dogs and fatty cuts of red and white meats, such as rib eye steak, sausage, ham, and bacon.  High-fat dairy items, such as whole milk, butter, and cream cheese.  Eat small, frequent meals instead of large meals.  Avoid drinking large amounts of liquid with your meals.  Avoid eating meals during the 2-3 hours before bedtime.  Avoid lying down right after you eat.  Do not exercise right after you  eat. General Instructions  Pay attention to any changes in your symptoms.  Take over-the-counter and prescription medicines only as told by your health care provider. Do not take aspirin, ibuprofen, or other NSAIDs unless your health care provider told you to do so.  Do not use any tobacco products, including cigarettes, chewing tobacco, and e-cigarettes. If you need help quitting, ask your health care provider.  Wear loose-fitting clothing. Do not wear anything tight around your waist that causes pressure on your abdomen.  Raise (elevate) the head of your bed 6 inches (15cm).  Try to reduce your stress, such as with yoga or meditation. If you need help reducing stress, ask your health care provider.  If you are overweight, reduce your weight to an amount that is healthy for you. Ask your health care provider for guidance about a safe weight loss goal.  Keep all follow-up visits as told by your health care provider. This is important. SEEK MEDICAL CARE IF:  You have new symptoms.  You have unexplained weight loss.  You have difficulty swallowing, or it hurts to swallow.  You have wheezing or a persistent cough.  Your symptoms do not improve with treatment.  You have a hoarse voice. [place]  IF:  You have pain in your arms, neck, jaw, teeth, or back.  You feel sweaty, dizzy, or light-headed.  You have chest pain or shortness of breath.  You vomit and your vomit looks like blood or coffee grounds.  You faint.  Your stool is bloody or black.  You cannot swallow, drink, or eat.   This information is not intended to replace advice given to you by your health care provider. Make sure you discuss any questions you have with your health care provider.   Document Released: 06/10/2005 Document Revised: 05/22/2015 Document Reviewed: 12/26/2014 Elsevier Interactive Patient Education [employer].

## 2016-04-30 NOTE — H&P (View-Only) (Signed)
Primary Care Physician:  Glenda Chroman, MD  Primary Gastroenterologist:  Garfield Cornea, MD   Chief Complaint  Patient presents with  . Other    Time for EGD    HPI:  Alisha [name] is a 65 y.o. female here for follow up. She was last seen in 10/2014. She has h/o GERD, Barrett's esophagus, NASH cirrhosis, high-grade adenomas in the past removed from colon. She has required parenteral iron and transfusion in the past for IDA. She has been evaluated at [facility] in the past for her NASH cirrhosis with no plans for follow up at this time due to compensated liver disease. Last TCS 10/2014 with removal of tubular adenomas, next TCS 10/2019. Last EGD 11/2012, barrett's with no dysplasia, due for EGD now.   She is here for follow up. She is due for labs and u/s at this time. She has received 2 of 3 hep b vaccines, goes for final shot soon. She complains of postprandial loose stools for years. Manageable and not concerned. No melena, brbpr. Denies abd pain, heartburn, vomiting, unintentional weight loss. Mild lower ext edema improves after elevation at night. .  Current Outpatient Prescriptions  Medication Sig Dispense Refill  . GLIPIZIDE XL 10 MG 24 hr tablet Take 10 mg by mouth daily.     Marland Kitchen lisinopril (PRINIVIL,ZESTRIL) 10 MG tablet Take 10 mg by mouth daily.     . metFORMIN (GLUCOPHAGE) 1000 MG tablet Take 1,000 mg by mouth 2 (two) times daily with a meal.     . NEXIUM 40 MG capsule Take 40 mg by mouth daily before breakfast.     . propranolol (INDERAL) 10 MG tablet TAKE TWO (2) TABLETS BY MOUTH TWICE DAILY. 120 tablet 3  . VICTOZA 18 MG/3ML SOLN 1.8 mg daily.      No current facility-administered medications for this visit.     Allergies as of 04/08/2016 - Review Complete 04/08/2016  Allergen Reaction Noted  . Codeine Nausea And Vomiting 04/19/2012    Past Medical History:  Diagnosis Date  . Barrett's esophagus   . Cirrhosis of liver without mention of alcohol    hep B surface antigen and  HCV ab negative.pt has not had hepatitis A and B vaccines.U/S on 07/04/13 shows cirrhosis.  . Diverticula of colon    pancolonic  . Esophageal reflux   . Esophageal varices ([place])   . Hiatal hernia   . Iron deficiency anemia, unspecified   . Other and unspecified hyperlipidemia   . Portal hypertensive gastropathy   . Tubular adenoma   . Type II or unspecified type diabetes mellitus without mention of complication, not stated as uncontrolled   . Unspecified essential hypertension   . Unspecified hemorrhoids without mention of complication     Past Surgical History:  Procedure Laterality Date  . ABDOMINAL HYSTERECTOMY    . CESAREAN SECTION     x 2  . COLONOSCOPY  03/2006   left sided diverticula, splenic flexure tublar adenoma  . COLONOSCOPY  07/2002   villous tubular adenoma in rectum  . COLONOSCOPY  09/23/09   external hemorrhoids/scattered pan colonic diverticula otherwise normal. cecal lipoma bx negative, normal TI. Next TCS 09/2014  . COLONOSCOPY N/A 11/12/2014   Procedure: COLONOSCOPY;  Surgeon: Daneil Dolin, MD;  Location: [facility];  Service: Endoscopy;  Laterality: N/A;  1215 - moved to 12:30 - Ginger to notify pt  . ESOPHAGOGASTRODUODENOSCOPY  08/2006   barretts esophagus, no dyplasia  . ESOPHAGOGASTRODUODENOSCOPY  03/2006  barretts esophagus,bx focal atypia c/w low grade dysplasia, SB bx negative for celiac  . ESOPHAGOGASTRODUODENOSCOPY  09/23/09   3 columns of grade 2 esophageal varices/hiatal hernia/3-4 cm segment Barrett's esophagus without dysplasia. Next EGD 09/2012  . ESOPHAGOGASTRODUODENOSCOPY N/A 11/11/2012   Dr. Gala Romney- Barrett;s esophagus, esophageal varices, portal gastopathy. hiatal hernia  . [place]  2008  . small bowel capsule endoscopy  10/2009   normal    Family History  Problem Relation Age of Onset  . Alzheimer's disease Mother   . CAD Father   . Diabetes Mellitus II Father   . Alzheimer's disease Father   . Diabetes Mellitus II Sister    . Cancer - Other Brother   . Diabetes Mellitus II Brother   . Hypertension Brother     Social History   Social History  . Marital status: Married    Spouse name: N/A  . Number of children: N/A  . Years of education: N/A   Occupational History  . Not on file.   Social History Main Topics  . Smoking status: Never Smoker  . Smokeless tobacco: Not on file  . Alcohol use No  . Drug use: No  . Sexual activity: Yes   Other Topics Concern  . Not on file   Social History Narrative  . No narrative on file      ROS:  General: Negative for anorexia, weight loss, fever, chills, fatigue, weakness. Eyes: Negative for vision changes.  ENT: Negative for hoarseness, difficulty swallowing , nasal congestion. CV: Negative for chest pain, angina, palpitations, dyspnea on exertion, peripheral edema.  Respiratory: Negative for dyspnea at rest, dyspnea on exertion, cough, sputum, wheezing.  GI: See history of present illness. GU:  Negative for dysuria, hematuria, urinary incontinence, urinary frequency, nocturnal urination.  MS: Negative for joint pain, low back pain.  Derm: Negative for rash or itching.  Neuro: Negative for weakness, abnormal sensation, seizure, frequent headaches, memory loss, confusion.  Psych: Negative for anxiety, depression, suicidal ideation, hallucinations.  Endo: Negative for unusual weight change.  Heme: Negative for bruising or bleeding. Allergy: Negative for rash or hives.    Physical Examination:  BP (!) 117/56   Pulse 77   Temp 98.3 F (36.8 C) (Oral)   Ht 5' 1"  (1.549 m)   Wt 177 lb (80.3 kg)   BMI 33.44 kg/m    General: Well-nourished, well-developed in no acute distress.  Head: Normocephalic, atraumatic.   Eyes: Conjunctiva pink, no icterus. Mouth: Oropharyngeal mucosa moist and pink , no lesions erythema or exudate. Neck: Supple without thyromegaly, masses, or lymphadenopathy.  Lungs: Clear to auscultation bilaterally.  Heart: Regular  rate and rhythm, no murmurs rubs or gallops.  Abdomen: Bowel sounds are normal, nontender, nondistended, no hepatosplenomegaly or masses, no abdominal bruits or    hernia , no rebound or guarding.   Rectal: not performed Extremities: trace pedal lower extremity edema. No clubbing or deformities.  Neuro: Alert and oriented x 4 , grossly normal neurologically.  Skin: Warm and dry, no rash or jaundice.   Psych: Alert and cooperative, normal mood and affect.  Labs: No recent labs  Imaging Studies: No results found.  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 She is due for surveillance EGD at this time.  I have discussed the risks, alternatives, benefits with regards to but not limited to the risk of reaction to medication, bleeding, infection, perforation and the patient  is agreeable to proceed. Written consent to be obtained.  Will also update labs and abd u/s.

## 2016-05-01 ENCOUNTER — Other Ambulatory Visit: Payer: Self-pay | Admitting: Gastroenterology

## 2016-05-01 ENCOUNTER — Encounter: Payer: Self-pay | Admitting: Internal Medicine

## 2016-05-04 ENCOUNTER — Telehealth: Payer: Self-pay

## 2016-05-04 ENCOUNTER — Ambulatory Visit (HOSPITAL_COMMUNITY)
Admission: RE | Admit: 2016-05-04 | Discharge: 2016-05-04 | Disposition: A | Discharge status: Home / Self Care | Payer: BC Managed Care – PPO | Source: Ambulatory Visit | Attending: Gastroenterology | Admitting: Gastroenterology

## 2016-05-04 DIAGNOSIS — K8020 Calculus of gallbladder without cholecystitis without obstruction: Secondary | ICD-10-CM | POA: Insufficient documentation

## 2016-05-04 DIAGNOSIS — R161 Splenomegaly, not elsewhere classified: Secondary | ICD-10-CM | POA: Insufficient documentation

## 2016-05-04 DIAGNOSIS — K7460 Unspecified cirrhosis of liver: Secondary | ICD-10-CM | POA: Diagnosis present

## 2016-05-04 LAB — COMPREHENSIVE METABOLIC PANEL
ALT: 8 U/L (ref 6–29)
AST: 15 U/L (ref 10–35)
Albumin: 3.9 g/dL (ref 3.6–5.1)
Alkaline Phosphatase: 65 U/L (ref 33–130)
BILIRUBIN TOTAL: 2.3 mg/dL — AB (ref 0.2–1.2)
BUN: 9 mg/dL (ref 7–25)
CALCIUM: 8.8 mg/dL (ref 8.6–10.4)
CO2: 21 mmol/L (ref 20–31)
CREATININE: 0.78 mg/dL (ref 0.50–0.99)
Chloride: 107 mmol/L (ref 98–110)
GLUCOSE: 221 mg/dL — AB (ref 65–99)
Potassium: 4.6 mmol/L (ref 3.5–5.3)
SODIUM: 137 mmol/L (ref 135–146)
Total Protein: 6.6 g/dL (ref 6.1–8.1)

## 2016-05-04 LAB — CBC WITH DIFFERENTIAL/PLATELET
BASOS PCT: 1 %
Basophils Absolute: 27 cells/uL (ref 0–200)
Eosinophils Absolute: 108 cells/uL (ref 15–500)
Eosinophils Relative: 4 %
HCT: 21.1 % — ABNORMAL LOW (ref 35.0–45.0)
Hemoglobin: 5.6 g/dL — CL (ref 11.7–15.5)
LYMPHS PCT: 26 %
Lymphs Abs: 702 cells/uL — ABNORMAL LOW (ref 850–3900)
MCH: 16.6 pg — ABNORMAL LOW (ref 27.0–33.0)
MCHC: 25.6 g/dL — ABNORMAL LOW (ref 32.0–36.0)
MCV: 62.6 fL — ABNORMAL LOW (ref 80.0–100.0)
MONO ABS: 270 {cells}/uL (ref 200–950)
Monocytes Relative: 10 %
NEUTROS ABS: 1593 {cells}/uL (ref 1500–7800)
Neutrophils Relative %: 59 %
Platelets: 51 10*3/uL — ABNORMAL LOW (ref 140–400)
RBC: 3.37 MIL/uL — AB (ref 3.80–5.10)
RDW: 18 % — AB (ref 11.0–15.0)
WBC: 2.7 10*3/uL — AB (ref 3.8–10.8)

## 2016-05-04 LAB — IRON AND TIBC
%SAT: 4 % — ABNORMAL LOW (ref 11–50)
Iron: 18 ug/dL — ABNORMAL LOW (ref 45–160)
TIBC: 451 ug/dL — ABNORMAL HIGH (ref 250–450)
UIBC: 433 ug/dL — AB (ref 125–400)

## 2016-05-04 LAB — PROTIME-INR
INR: 1.2 — AB
PROTHROMBIN TIME: 13.1 s — AB (ref 9.0–11.5)

## 2016-05-04 NOTE — Telephone Encounter (Signed)
Just received a call from [place] at [place] reporting a Hemoglobin of 5.6.  Almyra Free is aware and is going to page Dr.Rourk.

## 2016-05-04 NOTE — Telephone Encounter (Signed)
Spoke with Alisha [name], pt should proceed to the [facility] ED and receive some blood. I called the pt at her home number-[id number], tried to call cell number-NA, no voicemail, called pt at work, spoke with her there. Informed her that her hgb was 5.6 and that we recommend that she go to [facility] ED and get some blood. I asked her how she was feeling. She said she was feeling ok and that she wanted to talk to her doctors that she works for at [facility] before she goes to the ED. She asked that I fax her a copy of her labs to [phone]. I advised the pt again that she should go to the ED and I have faxed a copy of the labs to her job so she can discuss it with her doctors there.

## 2016-05-04 NOTE — Telephone Encounter (Signed)
Can we find out exactly how she is taking propanolol? Dose and dosing? It is listed two different ways. Not sure how to refill this (it is from last week, and I am catching up).

## 2016-05-05 ENCOUNTER — Telehealth: Payer: Self-pay

## 2016-05-05 ENCOUNTER — Encounter (HOSPITAL_COMMUNITY): Payer: Self-pay | Admitting: Internal Medicine

## 2016-05-05 LAB — FERRITIN: Ferritin: 2 ng/mL — ABNORMAL LOW (ref 20–288)

## 2016-05-05 NOTE — Telephone Encounter (Signed)
Agree. She recently had an EGD for variceal screening. From what I understand, she has no overt GI bleeding or concerning symptoms. She has a known history of IDA and has no overt GI bleeding.   Routing to Jovista for [place] who ordered the labs for routine purposes. May need referral to Hematology? Looks like she has required iron and transfusions in the past for IDA. Last colonoscopy 2016. We had referred to the ED due to her significant anemia but patient chose to review with physicians she works with.   Annitta Needs, ANP-BC [facility]

## 2016-05-05 NOTE — Telephone Encounter (Signed)
Spoke with the pt, she said Dr.Vyas send her for 2 units this morning at [facility]. She is feeling a little better now. ifobt in the mail to the pt. She said it was ok to refer her to hematology. Please refer pt.

## 2016-05-05 NOTE — Telephone Encounter (Signed)
1. If patient did not go to the emergency department to receive blood, we need to arrange for outpatient transfusion. Would recommend 2 units of packed red blood cells. 2. Also recommend hematology referral for consideration of iron infusions. 3. Needs I FOBT as soon as she can have one completed. Last colonoscopy 2016, recent EGD, small bowel capsule in 2011. 4. See result note as well.

## 2016-05-05 NOTE — Telephone Encounter (Signed)
Letter mailed to the pt. 

## 2016-05-05 NOTE — Telephone Encounter (Signed)
I spoke with the pt, she said RMR changed her rx at her procedure- she is now taking 3-48m tablets in the morning and 2- 176mtablets at bedtime.

## 2016-05-05 NOTE — Telephone Encounter (Signed)
Tried to call pt home number- [phone]

## 2016-05-05 NOTE — Telephone Encounter (Signed)
Per RMR- Send letter to patient.  Send copy of letter with path to referring provider and PCP.  needs ov w extender in 3- 6 mos if not already made for cirrhosis care. I'm not sure future esoph. bxs for barretts worth the risks

## 2016-05-05 NOTE — Telephone Encounter (Signed)
Reminder in epic °

## 2016-05-05 NOTE — Progress Notes (Signed)
Labs indicate profound iron deficiency anemia. See telephone encounter for instructions. Meld 12. Chronic thrombocytopenia Abdominal ultrasound with no evidence of hepatoma. Known cirrhosis seen.  Abdominal ultrasound repeat in 6 months. Repeat CMET, PT/INR, CBC in 6 months.  Return ov in six months.  Add on direct bilirubin.

## 2016-05-05 NOTE — Progress Notes (Signed)
See lab result note. She also has gallstones previously seen.

## 2016-05-06 ENCOUNTER — Other Ambulatory Visit: Payer: Self-pay

## 2016-05-06 ENCOUNTER — Other Ambulatory Visit: Payer: Self-pay | Admitting: Gastroenterology

## 2016-05-06 DIAGNOSIS — D509 Iron deficiency anemia, unspecified: Secondary | ICD-10-CM

## 2016-05-06 DIAGNOSIS — K7460 Unspecified cirrhosis of liver: Secondary | ICD-10-CM

## 2016-05-06 NOTE — Progress Notes (Signed)
ON RECALL  °

## 2016-05-06 NOTE — Telephone Encounter (Signed)
Great.  Thanks

## 2016-05-06 NOTE — Telephone Encounter (Signed)
Referral has been made.

## 2016-05-13 ENCOUNTER — Other Ambulatory Visit: Payer: Self-pay | Admitting: Gastroenterology

## 2016-05-13 DIAGNOSIS — D509 Iron deficiency anemia, unspecified: Secondary | ICD-10-CM

## 2016-05-13 DIAGNOSIS — K7460 Unspecified cirrhosis of liver: Secondary | ICD-10-CM

## 2016-05-13 NOTE — Progress Notes (Signed)
Since she is going to hematology we do not have to send her back to lab. I was going to check for indirect bilirubin increases which could indicate hemolysis.   However, can we change her six month labs to LFTs, MET-7, PT/INR, CBC. That way we will get a direct bili then.

## 2016-05-21 ENCOUNTER — Telehealth: Payer: Self-pay | Admitting: Internal Medicine

## 2016-05-21 NOTE — Telephone Encounter (Signed)
Routing back to AB

## 2016-05-21 NOTE — Telephone Encounter (Signed)
Pt needs a prescription of Inderal called into [facility] in [place].

## 2016-05-21 NOTE — Telephone Encounter (Signed)
[name] is working on this.

## 2016-05-21 NOTE — Telephone Encounter (Signed)
We need clarification on what propanolol dosing she is taking. Unclear in refills.

## 2016-05-22 MED ORDER — PROPRANOLOL HCL 10 MG PO TABS: ORAL_TABLET | ORAL | 11 refills | Status: DC

## 2016-05-22 NOTE — Telephone Encounter (Signed)
Completed. Propanolol has been updated to 30 mg in the morning and 20 in the evening.  Annitta Needs, ANP-BC [facility]

## 2016-05-22 NOTE — Addendum Note (Signed)
Addended by: Annitta Needs on: 05/22/2016 09:55 AM   Modules accepted: Orders

## 2016-05-27 ENCOUNTER — Ambulatory Visit (HOSPITAL_COMMUNITY): Payer: BC Managed Care – PPO

## 2016-06-01 ENCOUNTER — Ambulatory Visit (INDEPENDENT_AMBULATORY_CARE_PROVIDER_SITE_OTHER): Payer: BC Managed Care – PPO | Admitting: Gastroenterology

## 2016-06-01 ENCOUNTER — Encounter: Payer: Self-pay | Admitting: Gastroenterology

## 2016-06-01 VITALS — BP 109/68 | HR 89 | Temp 98.2°F | Ht 61.0 in | Wt 171.0 lb

## 2016-06-01 DIAGNOSIS — K7460 Unspecified cirrhosis of liver: Secondary | ICD-10-CM | POA: Diagnosis not present

## 2016-06-01 DIAGNOSIS — D509 Iron deficiency anemia, unspecified: Secondary | ICD-10-CM

## 2016-06-01 NOTE — Progress Notes (Signed)
Primary Care Physician: Glenda Chroman, MD  Primary Gastroenterologist:  Garfield Cornea, MD   Chief Complaint  Patient presents with  . Follow-up    HPI: Alisha [name] is a 65 y.o. female here For follow-up. She was seen in the office in July 2017 for history of GERD, Barrett's esophagus, [name] cirrhosis, high-grade adenomas removed from the colon the past. She has require parenteral iron and transfusion in the past for IDA. She has been evaluated at [facility] in the past for [facility] cirrhosis with no plans to follow-up at the time due to Well compensated liver disease. Colonoscopy showing 2016 with removal of tubular adenomas, next colonoscopy planned for February 2021. EGD last month showed grade 2-3 esophageal varices in the lower esophagus, 2 tongues of salmon-colored epithelium coming up 3 cm from the GE junction, small hiatal hernia, portal gastropathy. Single biopsy from Barrett's esophagus only, given variceal columns present. Biopsy showed Barrett's but no dysplasia.  Patient required 2 units of packed red blood cells after her routine labs were done back in August. Hemoglobin was 5.6, MCV 62.6, platelets 51,000, white blood cell count 2700. Iron was 18, iron saturations 4 percent, TIBC 451, ferritin 2. MELD 12. Abdominal ultrasound was cirrhotic changes but no hepatoma. Splenomegaly increased from prior study. She has gallstones but no signs of acute cholecystitis.  Patient denies any GI complaints. Bowel movements are regular. She denies any blood in the stool or melena. Overall fatigue is improved. Denies shortness of breath/dyspnea on exertion/lightheadedness. Appetite okay. Denies vomiting. No heartburn. No dysphagia.   Current Outpatient Prescriptions  Medication Sig Dispense Refill  . GLIPIZIDE XL 10 MG 24 hr tablet Take 10 mg by mouth daily.     Marland Kitchen lisinopril (PRINIVIL,ZESTRIL) 10 MG tablet Take 10 mg by mouth daily.     . metFORMIN (GLUCOPHAGE) 1000 MG tablet Take 1,000 mg by  mouth 2 (two) times daily with a meal.     . NEXIUM 40 MG capsule Take 40 mg by mouth daily before breakfast.     . propranolol (INDERAL) 10 MG tablet Take 3 tablets in the morning and 2 in the evening 150 tablet 11  . VICTOZA 18 MG/3ML SOLN 1.8 mg daily.      No current facility-administered medications for this visit.     Allergies as of 06/01/2016 - Review Complete 06/01/2016  Allergen Reaction Noted  . Codeine Nausea And Vomiting 04/19/2012   Past Medical History:  Diagnosis Date  . Barrett's esophagus   . Cirrhosis of liver without mention of alcohol    hep B surface antigen and HCV ab negative.pt has not had hepatitis A and B vaccines.U/S on 07/04/13 shows cirrhosis.  . Diverticula of colon    pancolonic  . Esophageal reflux   . Esophageal varices ([place])   . Hiatal hernia   . Iron deficiency anemia, unspecified   . Other and unspecified hyperlipidemia   . Portal hypertensive gastropathy   . Tubular adenoma   . Type II or unspecified type diabetes mellitus without mention of complication, not stated as uncontrolled   . Unspecified essential hypertension   . Unspecified hemorrhoids without mention of complication    Past Surgical History:  Procedure Laterality Date  . ABDOMINAL HYSTERECTOMY    . BIOPSY  04/30/2016   Procedure: BIOPSY;  Surgeon: Daneil Dolin, MD;  [facility];  Service: Endoscopy;;  esophagus  . CESAREAN SECTION     x 2  . COLONOSCOPY  03/2006   left sided diverticula, splenic flexure tublar adenoma  . COLONOSCOPY  07/2002   villous tubular adenoma in rectum  . COLONOSCOPY  09/23/09   external hemorrhoids/scattered pan colonic diverticula otherwise normal. cecal lipoma bx negative, normal TI. Next TCS 09/2014  . COLONOSCOPY N/A 11/12/2014   Procedure: COLONOSCOPY;  Surgeon: Daneil Dolin, MD;  [facility];  Service: Endoscopy;  Laterality: N/A;  1215 - moved to 12:30 - Ginger to notify pt  . ESOPHAGOGASTRODUODENOSCOPY  08/2006    barretts esophagus, no dyplasia  . ESOPHAGOGASTRODUODENOSCOPY  03/2006   barretts esophagus,bx focal atypia c/w low grade dysplasia, SB bx negative for celiac  . ESOPHAGOGASTRODUODENOSCOPY  09/23/09   3 columns of grade 2 esophageal varices/hiatal hernia/3-4 cm segment Barrett's esophagus without dysplasia. Next EGD 09/2012  . ESOPHAGOGASTRODUODENOSCOPY N/A 11/11/2012   Dr. Gala Romney- Barrett;s esophagus, esophageal varices, portal gastopathy. hiatal hernia  . ESOPHAGOGASTRODUODENOSCOPY N/A 04/30/2016   Procedure: ESOPHAGOGASTRODUODENOSCOPY (EGD);  Surgeon: Daneil Dolin, MD;  [facility];  Service: Endoscopy;  Laterality: N/A;  815  . [place]  2008  . small bowel capsule endoscopy  10/2009   normal    ROS:  General: Negative for anorexia, weight loss, fever, chills, fatigue, weakness. ENT: Negative for hoarseness, difficulty swallowing , nasal congestion. CV: Negative for chest pain, angina, palpitations, dyspnea on exertion, peripheral edema.  Respiratory: Negative for dyspnea at rest, dyspnea on exertion, cough, sputum, wheezing.  GI: See history of present illness. GU:  Negative for dysuria, hematuria, urinary incontinence, urinary frequency, nocturnal urination.  Endo: Negative for unusual weight change.    Physical Examination:   BP 109/68   Pulse 89   Temp 98.2 F (36.8 C) (Oral)   Ht 5' 1"  (1.549 m)   Wt 171 lb (77.6 kg)   BMI 32.31 kg/m   General: Well-nourished, well-developed in no acute distress.  Eyes: No icterus. Mouth: Oropharyngeal mucosa moist and pink , no lesions erythema or exudate. Lungs: Clear to auscultation bilaterally.  Heart: Regular rate and rhythm, no murmurs rubs or gallops.  Abdomen: Bowel sounds are normal, nontender, nondistended, no hepatosplenomegaly appreciated, no masses, no abdominal bruits or hernia , no rebound or guarding.   Extremities: No lower extremity edema. No clubbing or deformities. Neuro: Alert and oriented x 4     Skin: Warm and dry, no jaundice.   Psych: Alert and cooperative, normal mood and affect.  Labs:  Lab Results  Component Value Date   CREATININE 0.78 05/04/2016   BUN 9 05/04/2016   NA 137 05/04/2016   K 4.6 05/04/2016   CL 107 05/04/2016   CO2 21 05/04/2016   Lab Results  Component Value Date   ALT 8 05/04/2016   AST 15 05/04/2016   ALKPHOS 65 05/04/2016   BILITOT 2.3 (H) 05/04/2016   Lab Results  Component Value Date   WBC 2.7 (L) 05/04/2016   HGB 5.6 (LL) 05/04/2016   HCT 21.1 (L) 05/04/2016   MCV 62.6 (L) 05/04/2016   PLT 51 (L) 05/04/2016   Lab Results  Component Value Date   IRON 18 (L) 05/04/2016   TIBC 451 (H) 05/04/2016   FERRITIN 2 (L) 05/04/2016    Imaging Studies: US Abdomen Complete  Result Date: 05/04/2016 CLINICAL DATA:  History of hepatic cirrhosis, hepatocellular screening today. History of gallstones, esophageal varices, diabetes. EXAM: ABDOMEN ULTRASOUND COMPLETE COMPARISON:  Abdominal ultrasound of May 04, 2013 FINDINGS: Gallbladder: There are multiple echogenic mobile shadowing stones. There is  no gallbladder wall thickening, pericholecystic fluid, or positive sonographic Murphy's sign. Common bile duct: Diameter: 2.9 mm Liver: The hepatic echotexture is increased. The surface contour is irregular. There is no discrete mass or ductal dilation. IVC: No abnormality visualized. Pancreas: Visualized portion unremarkable. Spleen: There is splenomegaly. The calculated volume is 1567 cc. The splenic dimensions are 19.1 x 20.1 x 7.8 cm. Right Kidney: Length: 10.5 cm. Echogenicity within normal limits. No mass or hydronephrosis visualized. Left Kidney: Length: 12 cm. Echogenicity within normal limits. No mass or hydronephrosis visualized. Abdominal aorta: No aneurysm visualized. Other findings: There is no ascites. IMPRESSION: 1. Cirrhotic changes within the liver without evidence of suspicious masses. No ascites. 2. Splenomegaly which has increased since the  previous study. 3. Gallstones without sonographic evidence of acute cholecystitis. Electronically Signed   By: David  Martinique M.D.   On: 05/04/2016 09:32

## 2016-06-01 NOTE — Assessment & Plan Note (Signed)
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 She had her inderal recently increased due to pulse consistently in the 70 range. Her pulse is in the 80s today on new dose. Cannot rule out response to anemia. Patient will keep log of pulse at home for next week. May have to further adjust her propranolol but will await upcoming labs regarding anemia.   She is currently up to date on hepatoma surveillance and labs. She will come back in 3 months to see Dr. Gala Romney.

## 2016-06-01 NOTE — Patient Instructions (Signed)
1. Keep a log of your pulse (twice daily morning and evening) for the next one week. Call us with results or mail to [facility], [place], [place] [phone]. 2. I will follow up on your upcoming hematology evaluation.  3. Return to the office in 3 months to see Dr. Gala Romney.

## 2016-06-01 NOTE — Progress Notes (Signed)
cc'ed to pcp °

## 2016-06-01 NOTE — Assessment & Plan Note (Addendum)
H/O IDA with extensive work up 2011 including EGD/TCS/small bowel capsule study. Recent EGD with portal gastropathy, tcs 2016 tubular adenomas. Cannot exclude chronic occult GI bleeding from UGI tract as source of her decline in Hgb and iron.   Patient received 2 units of prbcs 04/2016 under direction of PCP. She tells me that she has not had additional labs. She is scheduled to see hematology in two days. I will follow up their labs and recommendations.

## 2016-06-03 ENCOUNTER — Encounter (HOSPITAL_COMMUNITY): Payer: Self-pay | Admitting: Hematology

## 2016-06-03 ENCOUNTER — Other Ambulatory Visit: Payer: Self-pay | Admitting: Hematology

## 2016-06-03 ENCOUNTER — Encounter (HOSPITAL_COMMUNITY): Payer: BC Managed Care – PPO | Attending: Hematology | Admitting: Hematology

## 2016-06-03 ENCOUNTER — Encounter (HOSPITAL_COMMUNITY): Payer: BC Managed Care – PPO

## 2016-06-03 VITALS — BP 120/48 | HR 82 | Temp 97.7°F | Resp 18 | Ht 71.0 in | Wt 172.2 lb

## 2016-06-03 DIAGNOSIS — Z7984 Long term (current) use of oral hypoglycemic drugs: Secondary | ICD-10-CM | POA: Insufficient documentation

## 2016-06-03 DIAGNOSIS — K7689 Other specified diseases of liver: Secondary | ICD-10-CM | POA: Diagnosis not present

## 2016-06-03 DIAGNOSIS — K2270 Barrett's esophagus without dysplasia: Secondary | ICD-10-CM | POA: Diagnosis not present

## 2016-06-03 DIAGNOSIS — E119 Type 2 diabetes mellitus without complications: Secondary | ICD-10-CM | POA: Diagnosis not present

## 2016-06-03 DIAGNOSIS — E611 Iron deficiency: Secondary | ICD-10-CM

## 2016-06-03 DIAGNOSIS — R161 Splenomegaly, not elsewhere classified: Secondary | ICD-10-CM

## 2016-06-03 DIAGNOSIS — K5730 Diverticulosis of large intestine without perforation or abscess without bleeding: Secondary | ICD-10-CM | POA: Insufficient documentation

## 2016-06-03 DIAGNOSIS — K7460 Unspecified cirrhosis of liver: Secondary | ICD-10-CM | POA: Insufficient documentation

## 2016-06-03 DIAGNOSIS — D509 Iron deficiency anemia, unspecified: Secondary | ICD-10-CM

## 2016-06-03 DIAGNOSIS — D689 Coagulation defect, unspecified: Secondary | ICD-10-CM

## 2016-06-03 DIAGNOSIS — Z79899 Other long term (current) drug therapy: Secondary | ICD-10-CM | POA: Diagnosis not present

## 2016-06-03 DIAGNOSIS — D696 Thrombocytopenia, unspecified: Secondary | ICD-10-CM

## 2016-06-03 DIAGNOSIS — Z809 Family history of malignant neoplasm, unspecified: Secondary | ICD-10-CM

## 2016-06-03 LAB — COMPREHENSIVE METABOLIC PANEL
ALBUMIN: 3.8 g/dL (ref 3.5–5.0)
ALT: 14 U/L (ref 14–54)
ANION GAP: 6 (ref 5–15)
AST: 23 U/L (ref 15–41)
Alkaline Phosphatase: 70 U/L (ref 38–126)
BUN: 9 mg/dL (ref 6–20)
CALCIUM: 8.8 mg/dL — AB (ref 8.9–10.3)
CHLORIDE: 107 mmol/L (ref 101–111)
CO2: 24 mmol/L (ref 22–32)
Creatinine, Ser: 0.71 mg/dL (ref 0.44–1.00)
GFR calc non Af Amer: >60 mL/min (ref >60)
Glucose, Bld: 214 mg/dL — ABNORMAL HIGH (ref 65–99)
Potassium: 4.7 mmol/L (ref 3.5–5.1)
Sodium: 137 mmol/L (ref 135–145)
Total Bilirubin: 2.1 mg/dL — ABNORMAL HIGH (ref 0.3–1.2)
Total Protein: 6.8 g/dL (ref 6.5–8.1)

## 2016-06-03 LAB — IRON AND TIBC
IRON: 30 ug/dL (ref 28–170)
Saturation Ratios: 6 % — ABNORMAL LOW (ref 10.4–31.8)
TIBC: 510 ug/dL — AB (ref 250–450)
UIBC: 480 ug/dL

## 2016-06-03 LAB — CBC WITH DIFFERENTIAL/PLATELET
BASOS ABS: 0 10*3/uL (ref 0.0–0.1)
Basophils Relative: 1 %
EOS ABS: 0.1 10*3/uL (ref 0.0–0.7)
Eosinophils Relative: 3 %
HCT: 27.5 % — ABNORMAL LOW (ref 36.0–46.0)
HEMOGLOBIN: 7.7 g/dL — AB (ref 12.0–15.0)
LYMPHS PCT: 27 %
Lymphs Abs: 1 10*3/uL (ref 0.7–4.0)
MCH: 19.8 pg — ABNORMAL LOW (ref 26.0–34.0)
MCHC: 28 g/dL — AB (ref 30.0–36.0)
MCV: 70.7 fL — ABNORMAL LOW (ref 78.0–100.0)
Monocytes Absolute: 0.3 10*3/uL (ref 0.1–1.0)
Monocytes Relative: 7 %
NEUTROS ABS: 2.2 10*3/uL (ref 1.7–7.7)
Neutrophils Relative %: 62 %
PLATELETS: 74 10*3/uL — AB (ref 150–400)
RBC: 3.89 MIL/uL (ref 3.87–5.11)
RDW: 22.1 % — AB (ref 11.5–15.5)
WBC: 3.6 10*3/uL — ABNORMAL LOW (ref 4.0–10.5)

## 2016-06-03 LAB — FERRITIN: Ferritin: 3 ng/mL — ABNORMAL LOW (ref 11–307)

## 2016-06-03 LAB — VITAMIN B12: VITAMIN B 12: 241 pg/mL (ref 180–914)

## 2016-06-03 LAB — RETICULOCYTES
RBC.: 3.89 MIL/uL (ref 3.87–5.11)
RETIC CT PCT: 0.9 % (ref 0.4–3.1)
Retic Count, Absolute: 35 10*3/uL (ref 19.0–186.0)

## 2016-06-03 NOTE — Patient Instructions (Addendum)
[place] at [facility]  Discharge Instructions:  Seen by MD Irene Limbo today.  Labs today.  Weekly IV iron (x2)  Follow up with MD Penland in 2 months. _______________________________________________________________  Thank you for choosing [place] at [facility] to provide your oncology and hematology care.  To afford each patient quality time with our providers, please arrive at least 15 minutes before your scheduled appointment.  You need to re-schedule your appointment if you arrive 10 or more minutes late.  We strive to give you quality time with our providers, and arriving late affects you and other patients whose appointments are after yours.  Also, if you no show three or more times for appointments you may be dismissed from the clinic.  Again, thank you for choosing [place] at [place] hope is that these requests will allow you access to exceptional care and in a timely manner. _______________________________________________________________  If you have questions after your visit, please contact our office at [phone] between the hours of 8:30 a.m. and 5:00 p.m. Voicemails left after 4:30 p.m. will not be returned until the following business day. _______________________________________________________________  For prescription refill requests, have your pharmacy contact our office. _______________________________________________________________  Recommendations made by the consultant and any test results will be sent to your referring physician. _______________________________________________________________

## 2016-06-03 NOTE — Progress Notes (Signed)
[facility]    HEMATOLOGY/ONCOLOGY CONSULTATION NOTE  Date of Service: 06/03/2016  Patient Care Team: Glenda Chroman, MD as PCP - General (Internal Medicine) Daneil Dolin, MD (Gastroenterology)  CHIEF COMPLAINTS/PURPOSE OF CONSULTATION:  Anemia  HISTORY OF PRESENTING ILLNESS:  Alisha [name] is a wonderful 65 y.o. female who has been referred to [name] by Dr .Glenda Chroman, MD /Dr Gala Romney for evaluation and management of anemia.  Patient has a history of iron deficiency anemia, liver cirrhosis likely related to [facility] with portal hypertensive gastropathy esophageal varices, Barrett's esophagus, colonic diverticulosis who was recently noted to have severe iron deficiency with labs on 05/04/2016 showing hemoglobin of 5.6 with an MCV of 62.6, WBC count of 2.7k and platelets of 51k. Patient reports that she received 2 units of PRBCs and was referred to [name] for further evaluation. Ferritin level done at the time was 2 with an iron saturation of 4%. Patient has been referred for further evaluated and management of anemia and consideration of IV iron treatment.  Patient reports that she had a similar episode of severe iron deficiency anemia about 3 years ago when she was seen by Dr. Annabelle Harman and at that time had received 3 units of PRBCs as well as 2 doses of infusional IV iron. She reports is tolerating the IV iron without any problems.  Patient notes that when she presented with her recent severe anemia with hemoglobin of 5.6 she noted some fatigue but no significant shortness of breath or chest pain and no dizziness or presyncopal symptoms suggesting slowly developing iron. She notes significant ice craving.  Currently he denies any overt GI bleeding. No hematemesis no melena nor hematochezia. Reports that she had her last EGD on 04/30/2016 which showed portal hypertensive gastropathy and grade 2-3 lower esophageal varices but no active bleeding. No gastric varices noted. Changes of Barrett's esophagus  noted. Patient had her last colonoscopy on 11/12/2014 by Dr. Verlene Mayer which showed colonic diverticulosis with a 4 mm polyp in the mid descending segment and a 36m sessile polyp in the sigmoid colon.  Patient also recently had ultrasound of her abdomen on 05/04/2016 that showed cirrhotic changes in the liver without any suspicious masses. No ascites. Massive 20 cm splenomegaly. Gallstones without evidence of acute cholecystitis.  Patient notes that she is feeling a little better after the transfusions but still some fatigue. We discussed her lab findings and the fact that she still has pretty severe iron deficiency and will need a fair amount of iron and that the iron deficiency is likely a combination of poor absorption due to chronic PPI therapy and chronic GI losses.   MEDICAL HISTORY:  Past Medical History:  Diagnosis Date  . Barrett's esophagus   . Cirrhosis of liver without mention of alcohol    hep B surface antigen and HCV ab negative.pt has not had hepatitis A and B vaccines.U/S on 07/04/13 shows cirrhosis.  . Diverticula of colon    pancolonic  . Esophageal reflux   . Esophageal varices ([place]   . Hiatal hernia   . Iron deficiency anemia, unspecified   . Other and unspecified hyperlipidemia   . Portal hypertensive gastropathy   . Tubular adenoma   . Type II or unspecified type diabetes mellitus without mention of complication, not stated as uncontrolled   . Unspecified essential hypertension   . Unspecified hemorrhoids without mention of complication     SURGICAL HISTORY: Past Surgical History:  Procedure Laterality Date  . ABDOMINAL HYSTERECTOMY    .  BIOPSY  04/30/2016   Procedure: BIOPSY;  Surgeon: Daneil Dolin, MD;  [facility];  Service: Endoscopy;;  esophagus  . CESAREAN SECTION     x 2  . COLONOSCOPY  03/2006   left sided diverticula, splenic flexure tublar adenoma  . COLONOSCOPY  07/2002   villous tubular adenoma in rectum  . COLONOSCOPY  09/23/09    external hemorrhoids/scattered pan colonic diverticula otherwise normal. cecal lipoma bx negative, normal TI. Next TCS 09/2014  . COLONOSCOPY N/A 11/12/2014   Procedure: COLONOSCOPY;  Surgeon: Daneil Dolin, MD;  [facility];  Service: Endoscopy;  Laterality: N/A;  1215 - moved to 12:30 - Ginger to notify pt  . ESOPHAGOGASTRODUODENOSCOPY  08/2006   barretts esophagus, no dyplasia  . ESOPHAGOGASTRODUODENOSCOPY  03/2006   barretts esophagus,bx focal atypia c/w low grade dysplasia, SB bx negative for celiac  . ESOPHAGOGASTRODUODENOSCOPY  09/23/09   3 columns of grade 2 esophageal varices/hiatal hernia/3-4 cm segment Barrett's esophagus without dysplasia. Next EGD 09/2012  . ESOPHAGOGASTRODUODENOSCOPY N/A 11/11/2012   Dr. Gala Romney- Barrett;s esophagus, esophageal varices, portal gastopathy. hiatal hernia  . ESOPHAGOGASTRODUODENOSCOPY N/A 04/30/2016   Procedure: ESOPHAGOGASTRODUODENOSCOPY (EGD);  Surgeon: Daneil Dolin, MD;  [facility];  Service: Endoscopy;  Laterality: N/A;  815  . [place]  2008  . small bowel capsule endoscopy  10/2009   normal    SOCIAL HISTORY: Social History   Social History  . Marital status: Married    Spouse name: N/A  . Number of children: N/A  . Years of education: N/A   Occupational History  . Not on file.   Social History Main Topics  . Smoking status: Never Smoker  . Smokeless tobacco: Never Used  . Alcohol use No  . Drug use: No  . Sexual activity: Yes   Other Topics Concern  . Not on file   Social History Narrative  . No narrative on file    FAMILY HISTORY: Family History  Problem Relation Age of Onset  . Alzheimer's disease Mother   . CAD Father   . Diabetes Mellitus II Father   . Alzheimer's disease Father   . Diabetes Mellitus II Sister   . Cancer - Other Brother   . Diabetes Mellitus II Brother   . Hypertension Brother     ALLERGIES:  is allergic to codeine.  MEDICATIONS:  Current Outpatient  Prescriptions  Medication Sig Dispense Refill  . GLIPIZIDE XL 10 MG 24 hr tablet Take 10 mg by mouth daily.     Marland Kitchen lisinopril (PRINIVIL,ZESTRIL) 10 MG tablet Take 10 mg by mouth daily.     . metFORMIN (GLUCOPHAGE) 1000 MG tablet Take 1,000 mg by mouth 2 (two) times daily with a meal.     . NEXIUM 40 MG capsule Take 40 mg by mouth daily before breakfast.     . propranolol (INDERAL) 10 MG tablet Take 3 tablets in the morning and 2 in the evening 150 tablet 11  . VICTOZA 18 MG/3ML SOLN 1.8 mg daily.      No current facility-administered medications for this visit.     REVIEW OF SYSTEMS:    10 Point review of Systems was done is negative except as noted above.  PHYSICAL EXAMINATION: ECOG PERFORMANCE STATUS: 1 - Symptomatic but completely ambulatory  . Vitals:   06/03/16 0903  BP: (!) 120/48  Pulse: 82  Resp: 18  Temp: 97.7 F (36.5 C)   Filed Weights   06/03/16 7096  Weight: 172 lb 3 oz (78.1 kg)   .Body mass index is 24.02 kg/m.  GENERAL:alert, in no acute distress and comfortable SKIN: skin color, texture, turgor are normal, no rashes or significant lesions EYES: normal, conjunctiva are pink and non-injected, sclera clear OROPHARYNX:no exudate, no erythema and lips, buccal mucosa, and tongue normal  NECK: supple, no JVD, thyroid normal size, non-tender, without nodularity LYMPH:  no palpable lymphadenopathy in the cervical, axillary or inguinal LUNGS: clear to auscultation with normal respiratory effort HEART: regular rate & rhythm,  no murmurs and no lower extremity edema ABDOMEN: abdomen soft, non-tender, normoactive bowel sounds,  palpable significant splenomegaly no overt ascites noted .  Musculoskeletal: no cyanosis of digits and no clubbing  PSYCH: alert & oriented x 3 with fluent speech NEURO: no focal motor/sensory deficits  LABORATORY DATA:  I have reviewed the data as listed  . CBC Latest Ref Rng & Units 05/04/2016 04/21/2012  WBC 3.8 - 10.8 K/uL 2.7(L) 4.2    Hemoglobin 11.7 - 15.5 g/dL 5.6(LL) 13.1  Hematocrit 35.0 - 45.0 % 21.1(L) 39.1  Platelets 140 - 400 K/uL 51(L) 64(L)    . CMP Latest Ref Rng & Units 05/04/2016 04/21/2012 09/23/2009  Glucose 65 - 99 mg/dL 221(H) 181(H) -  BUN 7 - 25 mg/dL 9 9 -  Creatinine 0.50 - 0.99 mg/dL 0.78 0.80 0.76  Sodium 135 - 146 mmol/L 137 138 -  Potassium 3.5 - 5.3 mmol/L 4.6 4.9 -  Chloride 98 - 110 mmol/L 107 103 -  CO2 20 - 31 mmol/L 21 26 -  Calcium 8.6 - 10.4 mg/dL 8.8 9.5 -  Total Protein 6.1 - 8.1 g/dL 6.6 7.0 6.6  Total Bilirubin 0.2 - 1.2 mg/dL 2.3(H) 1.5(H) 1.7(H)  Alkaline Phos 33 - 130 U/L 65 71 59  AST 10 - 35 U/L 15 25 24   ALT 6 - 29 U/L 8 24 20      RADIOGRAPHIC STUDIES: I have personally reviewed the radiological images as listed and agreed with the findings in the report. US Abdomen Complete  Result Date: 05/04/2016 CLINICAL DATA:  History of hepatic cirrhosis, hepatocellular screening today. History of gallstones, esophageal varices, diabetes. EXAM: ABDOMEN ULTRASOUND COMPLETE COMPARISON:  Abdominal ultrasound of May 04, 2013 FINDINGS: Gallbladder: There are multiple echogenic mobile shadowing stones. There is no gallbladder wall thickening, pericholecystic fluid, or positive sonographic Murphy's sign. Common bile duct: Diameter: 2.9 mm Liver: The hepatic echotexture is increased. The surface contour is irregular. There is no discrete mass or ductal dilation. IVC: No abnormality visualized. Pancreas: Visualized portion unremarkable. Spleen: There is splenomegaly. The calculated volume is 1567 cc. The splenic dimensions are 19.1 x 20.1 x 7.8 cm. Right Kidney: Length: 10.5 cm. Echogenicity within normal limits. No mass or hydronephrosis visualized. Left Kidney: Length: 12 cm. Echogenicity within normal limits. No mass or hydronephrosis visualized. Abdominal aorta: No aneurysm visualized. Other findings: There is no ascites. IMPRESSION: 1. Cirrhotic changes within the liver without evidence of  suspicious masses. No ascites. 2. Splenomegaly which has increased since the previous study. 3. Gallstones without sonographic evidence of acute cholecystitis. Electronically Signed   By: David  Martinique M.D.   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 With recent hemoglobin of 5.6 and an MCV of 62 status post PRBC 2. Ferritin of 2  with iron saturation 4% #2 severe iron deficiency likely due to chronic GI losses due to multiple GI pathologies that would be risk factors for bleeding. She also is on chronic PPI therapy for more than 5 years for Barrett's esophagus which might reduce her iron absorption. She also has thrombocytopenia and mild coagulopathy related liver disease. She has been intolerant to oral iron with GI distress even with small doses. 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 #5 liver cirrhosis likely related to [name] #6 diabetes mellitus Plan -We'll get CBC with differential, reticulocyte count, ferritin and iron profile , B12 today. -She'll set her up for IV Feraheme 510 mg every weekly 3 doses [her net iron deficit is probably close to 2 g]Informed verbal consent was obtained from the patient . -Avoid NSAIDs due to risk of bleeding and worsening from the therapy -Consider vitamin K for coagulopathy -Patient is apparently being evaluated for possible liver transplant at [facility]. -Will need to follow with Darcella Gasman or Dr Whitney Muse in 8 weeks with rpt labs.  earlier if any new concerns or worsening symptoms -continue follow-up with primary care physician for other medical comorbidities . - will likely continue to need intermittent IV iron  replacement due to ongoing risk of chronic GI losses. -Transfuse platelets When necessary for acute bleeding or platelet counts of less than 20,000 with coagulopathy.  All of the patients questions were answered with apparent satisfaction. The patient knows to call the clinic with any problems, questions or concerns.  I spent 50 minutes counseling the patient face to face. The total time spent in the appointment was 60 minutes and more than 50% was on counseling and direct patient cares.    Sullivan Lone MD [place] AAHIVMS [facility] Hematology/Oncology [facility]  (Office):       [phone] (Work cell):  [phone] (Fax):           [phone]  06/03/2016 9:03 AM

## 2016-06-09 ENCOUNTER — Telehealth: Payer: Self-pay | Admitting: Gastroenterology

## 2016-06-09 NOTE — Telephone Encounter (Signed)
Please let patient know that I reviewed her labs done at hematology clinic. Plan noted.  From our standpoint she needs to have I FOBT for recurrent IDA. Suspect chronic occult GI bleeding as part of etiology for her severe IDA. She had complete GI workup for the same back in 2011, may need to have repeat small bowel capsule endoscopy in the near future if ifobt is positive.

## 2016-06-11 ENCOUNTER — Encounter (HOSPITAL_COMMUNITY): Payer: Self-pay

## 2016-06-11 ENCOUNTER — Encounter (HOSPITAL_BASED_OUTPATIENT_CLINIC_OR_DEPARTMENT_OTHER): Payer: BC Managed Care – PPO

## 2016-06-11 VITALS — BP 130/53 | HR 74 | Temp 98.3°F | Resp 18

## 2016-06-11 DIAGNOSIS — D509 Iron deficiency anemia, unspecified: Secondary | ICD-10-CM

## 2016-06-11 MED ORDER — HEPARIN SOD (PORK) LOCK FLUSH 100 UNIT/ML IV SOLN: 500.0000 [IU] | Freq: Once | INTRAVENOUS | Status: DC | PRN

## 2016-06-11 MED ORDER — SODIUM CHLORIDE 0.9 % IJ SOLN: 10.0000 mL | INTRAMUSCULAR | Status: DC | PRN

## 2016-06-11 MED ORDER — SODIUM CHLORIDE 0.9 % IV SOLN
510.0000 mg | Freq: Once | INTRAVENOUS | Status: AC
  Administered 2016-06-11: 510 mg via INTRAVENOUS
  Filled 2016-06-11: qty 17

## 2016-06-11 MED ORDER — SODIUM CHLORIDE 0.9 % IV SOLN
Freq: Once | INTRAVENOUS | Status: AC
  Administered 2016-06-11: 14:00:00 via INTRAVENOUS

## 2016-06-11 NOTE — Patient Instructions (Signed)
Alisha [name] at [facility] Discharge Instructions  RECOMMENDATIONS MADE BY THE CONSULTANT AND ANY TEST RESULTS WILL BE SENT TO YOUR REFERRING PHYSICIAN.  Iron infusion today. Return as scheduled for lab work and office visit.   Thank you for choosing [place] at [facility] to provide your oncology and hematology care.  To afford each patient quality time with our provider, please arrive at least 15 minutes before your scheduled appointment time.   Beginning January 23rd 2017 lab work for the [facility] will be done in the  Main lab at [facility] on 1st floor. If you have a lab appointment with the [facility] please come in thru the  Main Entrance and check in at the main information desk  You need to re-schedule your appointment should you arrive 10 or more minutes late.  We strive to give you quality time with our providers, and arriving late affects you and other patients whose appointments are after yours.  Also, if you no show three or more times for appointments you may be dismissed from the clinic at the providers discretion.     Again, thank you for choosing [facility].  Our hope is that these requests will decrease the amount of time that you wait before being seen by our physicians.       _____________________________________________________________  Should you have questions after your visit to [facility], please contact our office at [phone] between the hours of 8:30 a.m. and 4:30 p.m.  Voicemails left after 4:30 p.m. will not be returned until the following business day.  For prescription refill requests, have your pharmacy contact our office.         Resources For Cancer Patients and their Caregivers ? American Cancer Society: Can assist with transportation, wigs, general needs, runs Look Good Feel Better.        (657)088-3307 ? Cancer Care: Provides financial assistance, online  support groups, medication/co-pay assistance.  1-800-813-HOPE 310-382-6331) ? [place] Assists [place] cancer patients and their families through emotional , educational and financial support.  (332) 303-3595 ? [place] DSS Where to apply for food stamps, Medicaid and utility assistance. 248 186 6677 ? RCATS: Transportation to medical appointments. 858-724-2310 ? Social Security Administration: May apply for disability if have a Stage IV cancer. (936)202-1539 947-494-7787 ? [employer], Disability and Transit Services: Assists with nutrition, care and transit needs. [place] Support Programs: @10RELATIVEDAYS @ > Cancer Support Group  2nd Tuesday of the month 1pm-2pm, Journey Room  > Creative Journey  3rd Tuesday of the month 1130am-1pm, Journey Room  > Look Good Feel Better  1st Wednesday of the month 10am-12 noon, Journey Room (Call [place] to register [phone])

## 2016-06-11 NOTE — Telephone Encounter (Signed)
Tried to call pt- NA on cell number, NA at home number but [facility] with recommendations. ifobt in the mail to the pt.

## 2016-06-11 NOTE — Progress Notes (Signed)
Tolerated infusion w/o adverse reaction.  Alert, in no distress.  VSS.  Discharged ambulatory.  

## 2016-06-18 ENCOUNTER — Encounter (HOSPITAL_COMMUNITY): Payer: Self-pay

## 2016-06-18 ENCOUNTER — Encounter (HOSPITAL_COMMUNITY): Payer: Medicare Other | Attending: Hematology

## 2016-06-18 VITALS — BP 120/54 | HR 72 | Temp 98.2°F | Resp 18

## 2016-06-18 DIAGNOSIS — D509 Iron deficiency anemia, unspecified: Secondary | ICD-10-CM | POA: Diagnosis not present

## 2016-06-18 DIAGNOSIS — D508 Other iron deficiency anemias: Secondary | ICD-10-CM

## 2016-06-18 DIAGNOSIS — D689 Coagulation defect, unspecified: Secondary | ICD-10-CM | POA: Insufficient documentation

## 2016-06-18 DIAGNOSIS — K7689 Other specified diseases of liver: Secondary | ICD-10-CM | POA: Insufficient documentation

## 2016-06-18 DIAGNOSIS — K5730 Diverticulosis of large intestine without perforation or abscess without bleeding: Secondary | ICD-10-CM | POA: Insufficient documentation

## 2016-06-18 DIAGNOSIS — E119 Type 2 diabetes mellitus without complications: Secondary | ICD-10-CM | POA: Insufficient documentation

## 2016-06-18 DIAGNOSIS — D696 Thrombocytopenia, unspecified: Secondary | ICD-10-CM | POA: Insufficient documentation

## 2016-06-18 DIAGNOSIS — K7460 Unspecified cirrhosis of liver: Secondary | ICD-10-CM | POA: Insufficient documentation

## 2016-06-18 DIAGNOSIS — K2270 Barrett's esophagus without dysplasia: Secondary | ICD-10-CM | POA: Insufficient documentation

## 2016-06-18 DIAGNOSIS — Z79899 Other long term (current) drug therapy: Secondary | ICD-10-CM | POA: Insufficient documentation

## 2016-06-18 DIAGNOSIS — Z7984 Long term (current) use of oral hypoglycemic drugs: Secondary | ICD-10-CM | POA: Insufficient documentation

## 2016-06-18 DIAGNOSIS — R161 Splenomegaly, not elsewhere classified: Secondary | ICD-10-CM | POA: Insufficient documentation

## 2016-06-18 MED ORDER — SODIUM CHLORIDE 0.9 % IJ SOLN
10.0000 mL | INTRAMUSCULAR | Status: DC | PRN
  Administered 2016-06-18: 3 mL
  Filled 2016-06-18: qty 10

## 2016-06-18 MED ORDER — SODIUM CHLORIDE 0.9 % IV SOLN
Freq: Once | INTRAVENOUS | Status: AC
  Administered 2016-06-18: 15:00:00 via INTRAVENOUS

## 2016-06-18 MED ORDER — SODIUM CHLORIDE 0.9 % IV SOLN
510.0000 mg | Freq: Once | INTRAVENOUS | Status: AC
  Administered 2016-06-18: 510 mg via INTRAVENOUS
  Filled 2016-06-18: qty 17

## 2016-06-18 NOTE — Progress Notes (Signed)
Patient tolerated Feraheme infusion today. No problems noted. Vitals stable and discharged home with family member. Follow up as scheduled.

## 2016-06-18 NOTE — Patient Instructions (Signed)
[place] at [facility] Discharge Instructions  RECOMMENDATIONS MADE BY THE CONSULTANT AND ANY TEST RESULTS WILL BE SENT TO YOUR REFERRING PHYSICIAN.  Feraheme given today. Follow up as scheduled.  Thank you for choosing [place] at [facility] to provide your oncology and hematology care.  To afford each patient quality time with our provider, please arrive at least 15 minutes before your scheduled appointment time.   Beginning January 23rd 2017 lab work for the [facility] will be done in the  Main lab at [facility] on 1st floor. If you have a lab appointment with the [facility] please come in thru the  Main Entrance and check in at the main information desk  You need to re-schedule your appointment should you arrive 10 or more minutes late.  We strive to give you quality time with our providers, and arriving late affects you and other patients whose appointments are after yours.  Also, if you no show three or more times for appointments you may be dismissed from the clinic at the providers discretion.     Again, thank you for choosing [facility].  Our hope is that these requests will decrease the amount of time that you wait before being seen by our physicians.       _____________________________________________________________  Should you have questions after your visit to [facility], please contact our office at [phone] between the hours of 8:30 a.m. and 4:30 p.m.  Voicemails left after 4:30 p.m. will not be returned until the following business day.  For prescription refill requests, have your pharmacy contact our office.         Resources For Cancer Patients and their Caregivers ? American Cancer Society: Can assist with transportation, wigs, general needs, runs Look Good Feel Better.        [phone] ? Cancer Care: Provides financial assistance, online support groups,  medication/co-pay assistance.  1-800-813-HOPE 313-151-7532) ? [place] Assists [place] cancer patients and their families through emotional , educational and financial support.  (724)026-8140 ? [place] DSS Where to apply for food stamps, Medicaid and utility assistance. 727-635-6813 ? RCATS: Transportation to medical appointments. (610)277-6901 ? Social Security Administration: May apply for disability if have a Stage IV cancer. 726-265-8200 470 374 6358 ? [employer], Disability and Transit Services: Assists with nutrition, care and transit needs. [place] Support Programs: @10RELATIVEDAYS @ > Cancer Support Group  2nd Tuesday of the month 1pm-2pm, Journey Room  > Creative Journey  3rd Tuesday of the month 1130am-1pm, Journey Room  > Look Good Feel Better  1st Wednesday of the month 10am-12 noon, Journey Room (Call [place] to register [phone])

## 2016-06-25 ENCOUNTER — Ambulatory Visit (HOSPITAL_COMMUNITY): Payer: BC Managed Care – PPO

## 2016-06-26 ENCOUNTER — Encounter (HOSPITAL_BASED_OUTPATIENT_CLINIC_OR_DEPARTMENT_OTHER): Payer: Medicare Other

## 2016-06-26 VITALS — BP 118/56 | HR 67 | Temp 97.9°F | Resp 18 | Wt 173.4 lb

## 2016-06-26 DIAGNOSIS — D509 Iron deficiency anemia, unspecified: Secondary | ICD-10-CM

## 2016-06-26 DIAGNOSIS — D508 Other iron deficiency anemias: Secondary | ICD-10-CM

## 2016-06-26 MED ORDER — SODIUM CHLORIDE 0.9 % IV SOLN
510.0000 mg | Freq: Once | INTRAVENOUS | Status: AC
  Administered 2016-06-26: 510 mg via INTRAVENOUS
  Filled 2016-06-26: qty 17

## 2016-06-26 MED ORDER — SODIUM CHLORIDE 0.9 % IJ SOLN
10.0000 mL | INTRAMUSCULAR | Status: DC | PRN
  Administered 2016-06-26: 10 mL
  Filled 2016-06-26: qty 10

## 2016-06-26 MED ORDER — SODIUM CHLORIDE 0.9 % IV SOLN
Freq: Once | INTRAVENOUS | Status: AC
  Administered 2016-06-26: 12:00:00 via INTRAVENOUS

## 2016-06-26 NOTE — Patient Instructions (Signed)
[place] at [facility] Discharge Instructions  RECOMMENDATIONS MADE BY THE CONSULTANT AND ANY TEST RESULTS WILL BE SENT TO YOUR REFERRING PHYSICIAN.  Received Feraheme today. Follow-up as scheduled. Call clinic for any questions or concerns  Thank you for choosing [place] at [facility] to provide your oncology and hematology care.  To afford each patient quality time with our provider, please arrive at least 15 minutes before your scheduled appointment time.   Beginning January 23rd 2017 lab work for the [facility] will be done in the  Main lab at [facility] on 1st floor. If you have a lab appointment with the [place] please come in thru the  Main Entrance and check in at the main information desk  You need to re-schedule your appointment should you arrive 10 or more minutes late.  We strive to give you quality time with our providers, and arriving late affects you and other patients whose appointments are after yours.  Also, if you no show three or more times for appointments you may be dismissed from the clinic at the providers discretion.     Again, thank you for choosing [facility].  Our hope is that these requests will decrease the amount of time that you wait before being seen by our physicians.       _____________________________________________________________  Should you have questions after your visit to [facility], please contact our office at [phone] between the hours of 8:30 a.m. and 4:30 p.m.  Voicemails left after 4:30 p.m. will not be returned until the following business day.  For prescription refill requests, have your pharmacy contact our office.         Resources For Cancer Patients and their Caregivers ? American Cancer Society: Can assist with transportation, wigs, general needs, runs Look Good Feel Better.        (614) [phone] ? Cancer Care: Provides financial  assistance, online support groups, medication/co-pay assistance.  1-800-813-HOPE 929-712-0427) ? [place] Assists [place] cancer patients and their families through emotional , educational and financial support.  562-653-2345 ? [place] DSS Where to apply for food stamps, Medicaid and utility assistance. 515-144-9756 ? RCATS: Transportation to medical appointments. 207-314-5699 ? Social Security Administration: May apply for disability if have a Stage IV cancer. 743-397-2593 (740) 765-4619 ? [employer], Disability and Transit Services: Assists with nutrition, care and transit needs. [place] Support Programs: @10RELATIVEDAYS @ > Cancer Support Group  2nd Tuesday of the month 1pm-2pm, Journey Room  > Creative Journey  3rd Tuesday of the month 1130am-1pm, Journey Room  > Look Good Feel Better  1st Wednesday of the month 10am-12 noon, Journey Room (Call [place] to register [phone])

## 2016-06-26 NOTE — Progress Notes (Signed)
Alisha [name] tolerated Feraheme well without complaints or incident. VSS upon discharge. Pt discharged self ambulatory in satisfactory condition with husband

## 2016-06-29 ENCOUNTER — Encounter: Payer: Self-pay | Admitting: Internal Medicine

## 2016-07-27 ENCOUNTER — Encounter: Payer: Self-pay | Admitting: Internal Medicine

## 2016-07-29 ENCOUNTER — Other Ambulatory Visit (HOSPITAL_COMMUNITY): Payer: Self-pay | Admitting: *Deleted

## 2016-07-29 DIAGNOSIS — D508 Other iron deficiency anemias: Secondary | ICD-10-CM

## 2016-08-03 ENCOUNTER — Encounter (HOSPITAL_COMMUNITY): Payer: Medicare Other

## 2016-08-03 ENCOUNTER — Encounter (HOSPITAL_COMMUNITY): Payer: Medicare Other | Attending: Hematology | Admitting: Hematology & Oncology

## 2016-08-03 ENCOUNTER — Other Ambulatory Visit (HOSPITAL_COMMUNITY): Payer: Self-pay

## 2016-08-03 ENCOUNTER — Encounter (HOSPITAL_COMMUNITY): Payer: Self-pay | Admitting: Hematology & Oncology

## 2016-08-03 VITALS — BP 132/50 | HR 68 | Temp 97.9°F | Resp 16 | Wt 173.5 lb

## 2016-08-03 DIAGNOSIS — K2270 Barrett's esophagus without dysplasia: Secondary | ICD-10-CM | POA: Insufficient documentation

## 2016-08-03 DIAGNOSIS — Z7984 Long term (current) use of oral hypoglycemic drugs: Secondary | ICD-10-CM | POA: Diagnosis not present

## 2016-08-03 DIAGNOSIS — K7689 Other specified diseases of liver: Secondary | ICD-10-CM | POA: Diagnosis not present

## 2016-08-03 DIAGNOSIS — E119 Type 2 diabetes mellitus without complications: Secondary | ICD-10-CM | POA: Insufficient documentation

## 2016-08-03 DIAGNOSIS — D508 Other iron deficiency anemias: Secondary | ICD-10-CM

## 2016-08-03 DIAGNOSIS — K7460 Unspecified cirrhosis of liver: Secondary | ICD-10-CM | POA: Diagnosis not present

## 2016-08-03 DIAGNOSIS — D509 Iron deficiency anemia, unspecified: Secondary | ICD-10-CM | POA: Insufficient documentation

## 2016-08-03 DIAGNOSIS — I8510 Secondary esophageal varices without bleeding: Secondary | ICD-10-CM

## 2016-08-03 DIAGNOSIS — E611 Iron deficiency: Secondary | ICD-10-CM

## 2016-08-03 DIAGNOSIS — K5730 Diverticulosis of large intestine without perforation or abscess without bleeding: Secondary | ICD-10-CM | POA: Diagnosis not present

## 2016-08-03 DIAGNOSIS — Z79899 Other long term (current) drug therapy: Secondary | ICD-10-CM | POA: Diagnosis not present

## 2016-08-03 DIAGNOSIS — D500 Iron deficiency anemia secondary to blood loss (chronic): Secondary | ICD-10-CM

## 2016-08-03 DIAGNOSIS — Z789 Other specified health status: Secondary | ICD-10-CM

## 2016-08-03 DIAGNOSIS — D696 Thrombocytopenia, unspecified: Secondary | ICD-10-CM | POA: Insufficient documentation

## 2016-08-03 DIAGNOSIS — R161 Splenomegaly, not elsewhere classified: Secondary | ICD-10-CM | POA: Diagnosis not present

## 2016-08-03 DIAGNOSIS — D689 Coagulation defect, unspecified: Secondary | ICD-10-CM | POA: Insufficient documentation

## 2016-08-03 LAB — CBC WITH DIFFERENTIAL/PLATELET
BASOS ABS: 0 10*3/uL (ref 0.0–0.1)
BASOS PCT: 0 %
EOS PCT: 2 %
Eosinophils Absolute: 0.1 10*3/uL (ref 0.0–0.7)
HCT: 36.6 % (ref 36.0–46.0)
Hemoglobin: 11.9 g/dL — ABNORMAL LOW (ref 12.0–15.0)
Lymphocytes Relative: 31 %
Lymphs Abs: 1.1 10*3/uL (ref 0.7–4.0)
MCH: 27.4 pg (ref 26.0–34.0)
MCHC: 32.5 g/dL (ref 30.0–36.0)
MCV: 84.3 fL (ref 78.0–100.0)
MONO ABS: 0.2 10*3/uL (ref 0.1–1.0)
Monocytes Relative: 5 %
NEUTROS ABS: 2.1 10*3/uL (ref 1.7–7.7)
Neutrophils Relative %: 62 %
PLATELETS: 48 10*3/uL — AB (ref 150–400)
RBC: 4.34 MIL/uL (ref 3.87–5.11)
RDW: 20.2 % — AB (ref 11.5–15.5)
WBC: 3.4 10*3/uL — ABNORMAL LOW (ref 4.0–10.5)

## 2016-08-03 LAB — SAMPLE TO BLOOD BANK

## 2016-08-03 LAB — IRON AND TIBC
Iron: 80 ug/dL (ref 28–170)
SATURATION RATIOS: 27 % (ref 10.4–31.8)
TIBC: 293 ug/dL (ref 250–450)
UIBC: 213 ug/dL

## 2016-08-03 LAB — FERRITIN: FERRITIN: 84 ng/mL (ref 11–307)

## 2016-08-03 NOTE — Progress Notes (Signed)
[facility]    HEMATOLOGY/ONCOLOGY PROGRESS NOTE  Date of Service: 08/03/2016  Patient Care Team: Glenda Chroman, MD as PCP - General (Internal Medicine) Daneil Dolin, MD (Gastroenterology)  CHIEF COMPLAINTS/PURPOSE OF CONSULTATION:  Anemia  HISTORY OF PRESENTING ILLNESS:  Alisha [name] is a wonderful 65 y.o. female who has been referred to [name] by Dr .Glenda Chroman, MD /Dr Gala Romney for evaluation and management of anemia.  Patient has a history of iron deficiency anemia, liver cirrhosis likely related to [facility] with portal hypertensive gastropathy esophageal varices, Barrett's esophagus, colonic diverticulosis who was recently noted to have severe iron deficiency with labs on 05/04/2016 showing hemoglobin of 5.6 with an MCV of 62.6, WBC count of 2.7k and platelets of 51k. Patient reports that she received 2 units of PRBCs and was referred to [name] for further evaluation. Ferritin level done at the time was 2 with an iron saturation of 4%. Patient has been referred for further evaluated and management of anemia and consideration of IV iron treatment.  [name] presents to the clinic today accompanied by her husband.  She says that she did well with her IV iron.   She is no longer eating ice nor craving it. She says that she gets a dry and sore mouth when her iron is low.   I have reviewed the labs with the patient.  She has not gotten a mammogram in 2 years.   She denies blood in her stool.  Her appetite is good.   She denies any other problems.   MEDICAL HISTORY:  Past Medical History:  Diagnosis Date  . Barrett's esophagus   . Cirrhosis of liver without mention of alcohol    hep B surface antigen and HCV ab negative.pt has not had hepatitis A and B vaccines.U/S on 07/04/13 shows cirrhosis.  . Diverticula of colon    pancolonic  . Esophageal reflux   . Esophageal varices ([place])   . Hiatal hernia   . Iron deficiency anemia, unspecified   . Other and unspecified hyperlipidemia   .  Portal hypertensive gastropathy   . Tubular adenoma   . Type II or unspecified type diabetes mellitus without mention of complication, not stated as uncontrolled   . Unspecified essential hypertension   . Unspecified hemorrhoids without mention of complication     SURGICAL HISTORY: Past Surgical History:  Procedure Laterality Date  . ABDOMINAL HYSTERECTOMY    . BIOPSY  04/30/2016   Procedure: BIOPSY;  Surgeon: Daneil Dolin, MD;  Location: [facility];  Service: Endoscopy;;  esophagus  . CESAREAN SECTION     x 2  . COLONOSCOPY  03/2006   left sided diverticula, splenic flexure tublar adenoma  . COLONOSCOPY  07/2002   villous tubular adenoma in rectum  . COLONOSCOPY  09/23/09   external hemorrhoids/scattered pan colonic diverticula otherwise normal. cecal lipoma bx negative, normal TI. Next TCS 09/2014  . COLONOSCOPY N/A 11/12/2014   Procedure: COLONOSCOPY;  Surgeon: Daneil Dolin, MD;  Location: [facility];  Service: Endoscopy;  Laterality: N/A;  1215 - moved to 12:30 - [place] to notify pt  . ESOPHAGOGASTRODUODENOSCOPY  08/2006   barretts esophagus, no dyplasia  . ESOPHAGOGASTRODUODENOSCOPY  03/2006   barretts esophagus,bx focal atypia c/w low grade dysplasia, SB bx negative for celiac  . ESOPHAGOGASTRODUODENOSCOPY  09/23/09   3 columns of grade 2 esophageal varices/hiatal hernia/3-4 cm segment Barrett's esophagus without dysplasia. Next EGD 09/2012  . ESOPHAGOGASTRODUODENOSCOPY N/A 11/11/2012   Dr. Gala Romney-  Barrett;s esophagus, esophageal varices, portal gastopathy. hiatal hernia  . ESOPHAGOGASTRODUODENOSCOPY N/A 04/30/2016   Procedure: ESOPHAGOGASTRODUODENOSCOPY (EGD);  Surgeon: Daneil Dolin, MD;  Location: [facility];  Service: Endoscopy;  Laterality: N/A;  815  . [place]  2008  . small bowel capsule endoscopy  10/2009   normal    SOCIAL HISTORY: Social History   Social History  . Marital status: Married    Spouse name: N/A  . Number of children: N/A  . Years  of education: N/A   Occupational History  . Not on file.   Social History Main Topics  . Smoking status: Never Smoker  . Smokeless tobacco: Never Used  . Alcohol use No  . Drug use: No  . Sexual activity: Yes   Other Topics Concern  . Not on file   Social History Narrative  . No narrative on file    FAMILY HISTORY: Family History  Problem Relation Age of Onset  . Alzheimer's disease Mother   . CAD Father   . Diabetes Mellitus II Father   . Alzheimer's disease Father   . Diabetes Mellitus II Sister   . Cancer - Other Brother   . Diabetes Mellitus II Brother   . Hypertension Brother     ALLERGIES:  is allergic to codeine.  MEDICATIONS:  Current Outpatient Prescriptions  Medication Sig Dispense Refill  . GLIPIZIDE XL 10 MG 24 hr tablet Take 10 mg by mouth daily.     Marland Kitchen lisinopril (PRINIVIL,ZESTRIL) 10 MG tablet Take 10 mg by mouth daily.     . metFORMIN (GLUCOPHAGE) 1000 MG tablet Take 1,000 mg by mouth 2 (two) times daily with a meal.     . NEXIUM 40 MG capsule Take 40 mg by mouth daily before breakfast.     . propranolol (INDERAL) 10 MG tablet Take 3 tablets in the morning and 2 in the evening 150 tablet 11  . VICTOZA 18 MG/3ML SOLN 1.8 mg daily.      No current facility-administered medications for this visit.     REVIEW OF SYSTEMS:   Review of Systems  Constitutional: Negative.        Appetite is good.   HENT: Negative.        Dry and sore mouth when iron is low.   Eyes: Negative.   Respiratory: Negative.   Cardiovascular: Negative.   Gastrointestinal: Negative.  Negative for blood in stool.  Genitourinary: Negative.   Musculoskeletal: Negative.   Skin: Negative.   Neurological: Negative.   Endo/Heme/Allergies: Negative.   Psychiatric/Behavioral: Negative.   All other systems reviewed and are negative. 14 Point review of Systems was done is negative except as noted above.  PHYSICAL EXAMINATION: ECOG PERFORMANCE STATUS: 1 - Symptomatic but completely  ambulatory  . Vitals:   08/03/16 0949  BP: (!) 132/50  Pulse: 68  Resp: 16  Temp: 97.9 F (36.6 C)   Filed Weights   08/03/16 0949  Weight: 173 lb 8 oz (78.7 kg)   .Body mass index is 24.2 kg/m.   Physical Exam  Constitutional: She is oriented to person, place, and time and well-developed, well-nourished, and in no distress.  Patient was able to get on exam table without assistance.   HENT:  Head: Normocephalic and atraumatic.  Eyes: EOM are normal. Pupils are equal, round, and reactive to light.  Neck: Normal range of motion. Neck supple.  Cardiovascular: Normal rate and regular rhythm.   Murmur heard. Pulmonary/Chest: Effort normal and breath sounds normal.  Abdominal: Soft. Bowel sounds are normal.  Musculoskeletal: Normal range of motion.  Neurological: She is alert and oriented to person, place, and time. Gait normal.  Skin: Skin is warm and dry.  Nursing note and vitals reviewed.   LABORATORY DATA:  I have reviewed the data as listed  . CBC Latest Ref Rng & Units 06/03/2016 05/04/2016 04/21/2012  WBC 4.0 - 10.5 K/uL 3.6(L) 2.7(L) 4.2  Hemoglobin 12.0 - 15.0 g/dL 7.7(L) 5.6(LL) 13.1  Hematocrit 36.0 - 46.0 % 27.5(L) 21.1(L) 39.1  Platelets 150 - 400 K/uL 74(L) 51(L) 64(L)    . CMP Latest Ref Rng & Units 06/03/2016 05/04/2016 04/21/2012  Glucose 65 - 99 mg/dL 214(H) 221(H) 181(H)  BUN 6 - 20 mg/dL 9 9 9   Creatinine 0.44 - 1.00 mg/dL 0.71 0.78 0.80  Sodium 135 - 145 mmol/L 137 137 138  Potassium 3.5 - 5.1 mmol/L 4.7 4.6 4.9  Chloride 101 - 111 mmol/L 107 107 103  CO2 22 - 32 mmol/L 24 21 26   Calcium 8.9 - 10.3 mg/dL 8.8(L) 8.8 9.5  Total Protein 6.5 - 8.1 g/dL 6.8 6.6 7.0  Total Bilirubin 0.3 - 1.2 mg/dL 2.1(H) 2.3(H) 1.5(H)  Alkaline Phos 38 - 126 U/L 70 65 71  AST 15 - 41 U/L 23 15 25   ALT 14 - 54 U/L 14 8 24    Results for KASIYAH, PLATTER (MRN [id number])   Ref. Range 08/03/2016 09:29 08/03/2016 09:30  Iron Latest Ref Range: 28 - 170 ug/dL 80   UIBC  Latest Units: ug/dL 213   TIBC Latest Ref Range: 250 - 450 ug/dL 293   Saturation Ratios Latest Ref Range: 10.4 - 31.8 % 27   Ferritin Latest Ref Range: 11 - 307 ng/mL 84   WBC Latest Ref Range: 4.0 - 10.5 K/uL  3.4 (L)  RBC Latest Ref Range: 3.87 - 5.11 MIL/uL  4.34  Hemoglobin Latest Ref Range: 12.0 - 15.0 g/dL  11.9 (L)  HCT Latest Ref Range: 36.0 - 46.0 %  36.6  MCV Latest Ref Range: 78.0 - 100.0 fL  84.3  MCH Latest Ref Range: 26.0 - 34.0 pg  27.4  MCHC Latest Ref Range: 30.0 - 36.0 g/dL  32.5  RDW Latest Ref Range: 11.5 - 15.5 %  20.2 (H)  Platelets Latest Ref Range: 150 - 400 K/uL  48 (L)  Neutrophils Latest Units: %  62  Lymphocytes Latest Units: %  31  Monocytes Relative Latest Units: %  5  Eosinophil Latest Units: %  2  Basophil Latest Units: %  0  NEUT# Latest Ref Range: 1.7 - 7.7 K/uL  2.1  Lymphocyte # Latest Ref Range: 0.7 - 4.0 K/uL  1.1  Monocyte # Latest Ref Range: 0.1 - 1.0 K/uL  0.2  Eosinophils Absolute Latest Ref Range: 0.0 - 0.7 K/uL  0.1  Basophils Absolute Latest Ref Range: 0.0 - 0.1 K/uL  0.0    RADIOGRAPHIC STUDIES: I have personally reviewed the radiological images as listed and agreed with the findings in the report. Study Result   CLINICAL DATA:  History of hepatic cirrhosis, hepatocellular screening today. History of gallstones, esophageal varices, diabetes.  EXAM: ABDOMEN ULTRASOUND COMPLETE  COMPARISON:  Abdominal ultrasound of May 04, 2013  FINDINGS: Gallbladder: There are multiple echogenic mobile shadowing stones. There is no gallbladder wall thickening, pericholecystic fluid, or positive sonographic Murphy's sign.  Common bile duct: Diameter: 2.9 mm  Liver: The hepatic echotexture is increased. The surface contour is irregular. There is no  discrete mass or ductal dilation.  IVC: No abnormality visualized.  Pancreas: Visualized portion unremarkable.  Spleen: There is splenomegaly. The calculated volume is 1567 cc.  The splenic dimensions are 19.1 x 20.1 x 7.8 cm.  Right Kidney: Length: 10.5 cm. Echogenicity within normal limits. No mass or hydronephrosis visualized.  Left Kidney: Length: 12 cm. Echogenicity within normal limits. No mass or hydronephrosis visualized.  Abdominal aorta: No aneurysm visualized.  Other findings: There is no ascites.  IMPRESSION: 1. Cirrhotic changes within the liver without evidence of suspicious masses. No ascites. 2. Splenomegaly which has increased since the previous study. 3. Gallstones without sonographic evidence of acute cholecystitis.   Electronically Signed   By: David  Martinique M.D.   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 With recent hemoglobin of 5.6 and an MCV of 62 status post PRBC 2. Ferritin of 2 with iron saturation 4% #2 severe iron deficiency likely due to chronic GI losses due to multiple GI pathologies that would be risk factors for bleeding. She also is on chronic PPI therapy for more than 5 years for Barrett's esophagus which might reduce her iron absorption. She also has thrombocytopenia and mild coagulopathy related liver disease. She has been intolerant to oral iron with GI distress even with small doses. 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 #5 liver cirrhosis likely related to [facility] She will need additional iron. Will arrange for 1 dose of feraheme.    -Avoid NSAIDs due to risk of bleeding and worsening from the therapy -Consider vitamin K for coagulopathy -Patient is apparently being evaluated for possible liver [facility]. - will likely continue to need intermittent IV iron replacement due to ongoing risk of chronic GI losses. -Transfuse platelets When necessary for acute bleeding or platelet counts of less than 20,000 with coagulopathy.  We will get her on a routine lab schedule of every 6 weeks.   She will return for a follow up and a CBC with ferritin in 3 months.  All of the patients questions were answered with apparent satisfaction. The patient knows to call the clinic with any problems, questions or concerns.  This document serves as a record of services personally performed by Ancil Linsey, MD. It was created on her behalf by Martinique Casey, a trained medical scribe. The creation of this record is based on the scribe's personal observations and the provider's statements to them. This document has been checked and approved by the attending provider.  I have reviewed the above documentation for accuracy and completeness and I agree with the above.  This note was electronically signed.  Molli Hazard, MD  08/03/2016 10:05 AM

## 2016-08-03 NOTE — Patient Instructions (Signed)
[place] at [facility] Discharge Instructions  RECOMMENDATIONS MADE BY THE CONSULTANT AND ANY TEST RESULTS WILL BE SENT TO YOUR REFERRING PHYSICIAN.  Exam with Dr. Ifrah Vest Muse today. Return for labs at the main desk in the lobby on first floor in 6 weeks. Return in 3 months for labs and to see MD.   Please see Amy as you leave for your appointment.  Thank you for choosing [place] at [facility] to provide your oncology and hematology care.  To afford each patient quality time with our provider, please arrive at least 15 minutes before your scheduled appointment time.   Beginning January 23rd 2017 lab work for the [facility] will be done in the  Main lab at [facility] on 1st floor. If you have a lab appointment with the [facility] please come in thru the  Main Entrance and check in at the main information desk  You need to re-schedule your appointment should you arrive 10 or more minutes late.  We strive to give you quality time with our providers, and arriving late affects you and other patients whose appointments are after yours.  Also, if you no show three or more times for appointments you may be dismissed from the clinic at the providers discretion.     Again, thank you for choosing [facility].  Our hope is that these requests will decrease the amount of time that you wait before being seen by our physicians.       _____________________________________________________________  Should you have questions after your visit to [facility], please contact our office at [phone] between the hours of 8:30 a.m. and 4:30 p.m.  Voicemails left after 4:30 p.m. will not be returned until the following business day.  For prescription refill requests, have your pharmacy contact our office.         Resources For Cancer Patients and their Caregivers ? American Cancer Society: Can assist with transportation, wigs,  general needs, runs Look Good Feel Better.        561-171-4694 ? Cancer Care: Provides financial assistance, online support groups, medication/co-pay assistance.  1-800-813-HOPE (478) 858-6964) ? [place] Assists [place] cancer patients and their families through emotional , educational and financial support.  (404)068-6543 ? [place] DSS Where to apply for food stamps, Medicaid and utility assistance. (705)547-3667 ? RCATS: Transportation to medical appointments. (620)086-0504 ? Social Security Administration: May apply for disability if have a Stage IV cancer. 845 412 2092 561-379-1020 ? [employer], Disability and Transit Services: Assists with nutrition, care and transit needs. [place] Support Programs: @10RELATIVEDAYS @ > Cancer Support Group  2nd Tuesday of the month 1pm-2pm, Journey Room  > Creative Journey  3rd Tuesday of the month 1130am-1pm, Journey Room  > Look Good Feel Better  1st Wednesday of the month 10am-12 noon, Journey Room (Call [place] to register [phone])

## 2016-09-15 ENCOUNTER — Encounter (HOSPITAL_COMMUNITY): Payer: Medicare Other | Attending: Hematology

## 2016-09-15 DIAGNOSIS — R161 Splenomegaly, not elsewhere classified: Secondary | ICD-10-CM | POA: Insufficient documentation

## 2016-09-15 DIAGNOSIS — D689 Coagulation defect, unspecified: Secondary | ICD-10-CM | POA: Insufficient documentation

## 2016-09-15 DIAGNOSIS — D509 Iron deficiency anemia, unspecified: Secondary | ICD-10-CM | POA: Insufficient documentation

## 2016-09-15 DIAGNOSIS — Z7984 Long term (current) use of oral hypoglycemic drugs: Secondary | ICD-10-CM | POA: Insufficient documentation

## 2016-09-15 DIAGNOSIS — K7689 Other specified diseases of liver: Secondary | ICD-10-CM | POA: Insufficient documentation

## 2016-09-15 DIAGNOSIS — K5730 Diverticulosis of large intestine without perforation or abscess without bleeding: Secondary | ICD-10-CM | POA: Diagnosis not present

## 2016-09-15 DIAGNOSIS — K7460 Unspecified cirrhosis of liver: Secondary | ICD-10-CM | POA: Diagnosis not present

## 2016-09-15 DIAGNOSIS — K2270 Barrett's esophagus without dysplasia: Secondary | ICD-10-CM | POA: Diagnosis not present

## 2016-09-15 DIAGNOSIS — D696 Thrombocytopenia, unspecified: Secondary | ICD-10-CM | POA: Diagnosis not present

## 2016-09-15 DIAGNOSIS — Z79899 Other long term (current) drug therapy: Secondary | ICD-10-CM | POA: Diagnosis not present

## 2016-09-15 DIAGNOSIS — E119 Type 2 diabetes mellitus without complications: Secondary | ICD-10-CM | POA: Diagnosis not present

## 2016-09-15 DIAGNOSIS — D508 Other iron deficiency anemias: Secondary | ICD-10-CM

## 2016-09-15 LAB — CBC WITH DIFFERENTIAL/PLATELET
BASOS PCT: 0 %
Basophils Absolute: 0 10*3/uL (ref 0.0–0.1)
EOS ABS: 0.1 10*3/uL (ref 0.0–0.7)
Eosinophils Relative: 4 %
HCT: 35.9 % — ABNORMAL LOW (ref 36.0–46.0)
HEMOGLOBIN: 12.1 g/dL (ref 12.0–15.0)
Lymphocytes Relative: 36 %
Lymphs Abs: 1.2 10*3/uL (ref 0.7–4.0)
MCH: 29.4 pg (ref 26.0–34.0)
MCHC: 33.7 g/dL (ref 30.0–36.0)
MCV: 87.3 fL (ref 78.0–100.0)
MONO ABS: 0.2 10*3/uL (ref 0.1–1.0)
Monocytes Relative: 6 %
NEUTROS PCT: 53 %
Neutro Abs: 1.7 10*3/uL (ref 1.7–7.7)
Platelets: 40 10*3/uL — ABNORMAL LOW (ref 150–400)
RBC: 4.11 MIL/uL (ref 3.87–5.11)
RDW: 15.8 % — AB (ref 11.5–15.5)
WBC: 3.2 10*3/uL — ABNORMAL LOW (ref 4.0–10.5)

## 2016-09-15 LAB — FERRITIN: FERRITIN: 31 ng/mL (ref 11–307)

## 2016-09-16 ENCOUNTER — Telehealth: Payer: Self-pay | Admitting: Internal Medicine

## 2016-09-16 NOTE — Telephone Encounter (Signed)
Letter mailed

## 2016-09-16 NOTE — Telephone Encounter (Signed)
Recall for ultrasound 

## 2016-09-23 ENCOUNTER — Other Ambulatory Visit (HOSPITAL_COMMUNITY): Payer: Self-pay | Admitting: Hematology & Oncology

## 2016-09-29 ENCOUNTER — Encounter (HOSPITAL_BASED_OUTPATIENT_CLINIC_OR_DEPARTMENT_OTHER): Payer: Medicare Other

## 2016-09-29 ENCOUNTER — Encounter (HOSPITAL_COMMUNITY): Payer: Self-pay

## 2016-09-29 VITALS — BP 118/58 | HR 79 | Temp 97.8°F | Resp 16

## 2016-09-29 DIAGNOSIS — D508 Other iron deficiency anemias: Secondary | ICD-10-CM

## 2016-09-29 DIAGNOSIS — I8510 Secondary esophageal varices without bleeding: Secondary | ICD-10-CM

## 2016-09-29 DIAGNOSIS — D509 Iron deficiency anemia, unspecified: Secondary | ICD-10-CM | POA: Diagnosis not present

## 2016-09-29 MED ORDER — SODIUM CHLORIDE 0.9 % IV SOLN
510.0000 mg | Freq: Once | INTRAVENOUS | Status: AC
  Administered 2016-09-29: 510 mg via INTRAVENOUS
  Filled 2016-09-29: qty 17

## 2016-09-29 MED ORDER — SODIUM CHLORIDE 0.9 % IV SOLN
INTRAVENOUS | Status: DC
  Administered 2016-09-29: 11:00:00 via INTRAVENOUS

## 2016-09-29 NOTE — Patient Instructions (Signed)
[place] at [facility] Discharge Instructions  RECOMMENDATIONS MADE BY THE CONSULTANT AND ANY TEST RESULTS WILL BE SENT TO YOUR REFERRING PHYSICIAN.  Iron infusion today. Return as scheduled for lab work and office visit.   Thank you for choosing [place] at [facility] to provide your oncology and hematology care.  To afford each patient quality time with our provider, please arrive at least 15 minutes before your scheduled appointment time.    If you have a lab appointment with the [place] [facility] please come in thru the  Main Entrance and check in at the main information desk  You need to re-schedule your appointment should you arrive 10 or more minutes late.  We strive to give you quality time with our providers, and arriving late affects you and other patients whose appointments are after yours.  Also, if you no show three or more times for appointments you may be dismissed from the clinic at the providers discretion.     Again, thank you for choosing [facility].  Our hope is that these requests will decrease the amount of time that you wait before being seen by our physicians.       _____________________________________________________________  Should you have questions after your visit to [facility], please contact our office at [phone] between the hours of 8:30 a.m. and 4:30 p.m.  Voicemails left after 4:30 p.m. will not be returned until the following business day.  For prescription refill requests, have your pharmacy contact our office.       Resources For Cancer Patients and their Caregivers ? American Cancer Society: Can assist with transportation, wigs, general needs, runs Look Good Feel Better.        838-218-1456 ? Cancer Care: Provides financial assistance, online support groups, medication/co-pay assistance.  1-800-813-HOPE 605-451-9719) ? [place] Assists  [place] cancer patients and their families through emotional , educational and financial support.  713-302-0019 ? [place] DSS Where to apply for food stamps, Medicaid and utility assistance. 786-170-8801 ? RCATS: Transportation to medical appointments. 3253385395 ? Social Security Administration: May apply for disability if have a Stage IV cancer. (847)740-1197 815 331 0434 ? [employer], Disability and Transit Services: Assists with nutrition, care and transit needs. [place] Support Programs: @10RELATIVEDAYS @ > Cancer Support Group  2nd Tuesday of the month 1pm-2pm, Journey Room  > Creative Journey  3rd Tuesday of the month 1130am-1pm, Journey Room  > Look Good Feel Better  1st Wednesday of the month 10am-12 noon, Journey Room (Call [place] to register [phone])

## 2016-09-29 NOTE — Progress Notes (Signed)
Tolerated infusion w/o adverse reaction.  Alert, in no distress.  VSS.  Discharged ambulatory.  

## 2016-10-09 ENCOUNTER — Other Ambulatory Visit: Payer: Self-pay

## 2016-10-09 DIAGNOSIS — K7460 Unspecified cirrhosis of liver: Secondary | ICD-10-CM

## 2016-10-09 DIAGNOSIS — D509 Iron deficiency anemia, unspecified: Secondary | ICD-10-CM

## 2016-11-02 ENCOUNTER — Encounter (HOSPITAL_COMMUNITY): Payer: Medicare Other | Attending: Oncology | Admitting: Oncology

## 2016-11-02 ENCOUNTER — Encounter (HOSPITAL_COMMUNITY): Payer: Self-pay

## 2016-11-02 ENCOUNTER — Encounter (HOSPITAL_COMMUNITY): Payer: Medicare Other

## 2016-11-02 ENCOUNTER — Other Ambulatory Visit (HOSPITAL_COMMUNITY)
Admission: RE | Admit: 2016-11-02 | Discharge: 2016-11-02 | Disposition: A | Discharge status: Home / Self Care | Payer: Medicare Other | Source: Ambulatory Visit | Attending: Gastroenterology | Admitting: Gastroenterology

## 2016-11-02 VITALS — BP 146/67 | HR 78 | Temp 97.9°F | Resp 16 | Wt 176.2 lb

## 2016-11-02 DIAGNOSIS — K7460 Unspecified cirrhosis of liver: Secondary | ICD-10-CM | POA: Diagnosis present

## 2016-11-02 DIAGNOSIS — D508 Other iron deficiency anemias: Secondary | ICD-10-CM

## 2016-11-02 DIAGNOSIS — R161 Splenomegaly, not elsewhere classified: Secondary | ICD-10-CM | POA: Diagnosis not present

## 2016-11-02 DIAGNOSIS — E611 Iron deficiency: Secondary | ICD-10-CM

## 2016-11-02 DIAGNOSIS — D509 Iron deficiency anemia, unspecified: Secondary | ICD-10-CM | POA: Diagnosis present

## 2016-11-02 DIAGNOSIS — D696 Thrombocytopenia, unspecified: Secondary | ICD-10-CM | POA: Diagnosis not present

## 2016-11-02 LAB — COMPREHENSIVE METABOLIC PANEL
ALT: 26 U/L (ref 14–54)
AST: 36 U/L (ref 15–41)
Albumin: 4 g/dL (ref 3.5–5.0)
Alkaline Phosphatase: 66 U/L (ref 38–126)
Anion gap: 9 (ref 5–15)
BILIRUBIN TOTAL: 2.3 mg/dL — AB (ref 0.3–1.2)
BUN: 8 mg/dL (ref 6–20)
CO2: 24 mmol/L (ref 22–32)
CREATININE: 0.74 mg/dL (ref 0.44–1.00)
Calcium: 9.4 mg/dL (ref 8.9–10.3)
Chloride: 102 mmol/L (ref 101–111)
GFR calc Af Amer: >60 mL/min (ref >60)
Glucose, Bld: 256 mg/dL — ABNORMAL HIGH (ref 65–99)
POTASSIUM: 4.4 mmol/L (ref 3.5–5.1)
Sodium: 135 mmol/L (ref 135–145)
TOTAL PROTEIN: 7.1 g/dL (ref 6.5–8.1)

## 2016-11-02 LAB — CBC WITH DIFFERENTIAL/PLATELET
BASOS ABS: 0 10*3/uL (ref 0.0–0.1)
Basophils Relative: 0 %
Eosinophils Absolute: 0.1 10*3/uL (ref 0.0–0.7)
Eosinophils Relative: 3 %
HEMATOCRIT: 37.8 % (ref 36.0–46.0)
Hemoglobin: 12.6 g/dL (ref 12.0–15.0)
LYMPHS PCT: 30 %
Lymphs Abs: 1 10*3/uL (ref 0.7–4.0)
MCH: 30.3 pg (ref 26.0–34.0)
MCHC: 33.3 g/dL (ref 30.0–36.0)
MCV: 90.9 fL (ref 78.0–100.0)
MONO ABS: 0.2 10*3/uL (ref 0.1–1.0)
Monocytes Relative: 7 %
NEUTROS ABS: 2 10*3/uL (ref 1.7–7.7)
Neutrophils Relative %: 60 %
Platelets: 47 10*3/uL — ABNORMAL LOW (ref 150–400)
RBC: 4.16 MIL/uL (ref 3.87–5.11)
RDW: 14.3 % (ref 11.5–15.5)
WBC: 3.3 10*3/uL — ABNORMAL LOW (ref 4.0–10.5)

## 2016-11-02 LAB — VITAMIN B12: VITAMIN B 12: 176 pg/mL — AB (ref 180–914)

## 2016-11-02 LAB — PROTIME-INR
INR: 1.19
PROTHROMBIN TIME: 15.2 s (ref 11.4–15.2)

## 2016-11-02 LAB — IRON AND TIBC
IRON: 107 ug/dL (ref 28–170)
Saturation Ratios: 36 % — ABNORMAL HIGH (ref 10.4–31.8)
TIBC: 298 ug/dL (ref 250–450)
UIBC: 191 ug/dL

## 2016-11-02 LAB — FOLATE: FOLATE: 12.8 ng/mL (ref >5.9)

## 2016-11-02 LAB — FERRITIN: Ferritin: 129 ng/mL (ref 11–307)

## 2016-11-02 NOTE — Patient Instructions (Signed)
[place] at [facility] Discharge Instructions  RECOMMENDATIONS MADE BY THE CONSULTANT AND ANY TEST RESULTS WILL BE SENT TO YOUR REFERRING PHYSICIAN.  You were seen today by Dr. Barron Schmid Follow up in 3 months with labwork See Amy up front for appointments   Thank you for choosing [place] at [facility] to provide your oncology and hematology care.  To afford each patient quality time with our provider, please arrive at least 15 minutes before your scheduled appointment time.    If you have a lab appointment with the [facility] please come in thru the  Main Entrance and check in at the main information desk  You need to re-schedule your appointment should you arrive 10 or more minutes late.  We strive to give you quality time with our providers, and arriving late affects you and other patients whose appointments are after yours.  Also, if you no show three or more times for appointments you may be dismissed from the clinic at the providers discretion.     Again, thank you for choosing [facility].  Our hope is that these requests will decrease the amount of time that you wait before being seen by our physicians.       _____________________________________________________________  Should you have questions after your visit to [facility], please contact our office at [phone] between the hours of 8:30 a.m. and 4:30 p.m.  Voicemails left after 4:30 p.m. will not be returned until the following business day.  For prescription refill requests, have your pharmacy contact our office.       Resources For Cancer Patients and their Caregivers ? American Cancer Society: Can assist with transportation, wigs, general needs, runs Look Good Feel Better.        435-385-2280 ? Cancer Care: Provides financial assistance, online support groups, medication/co-pay assistance.  1-800-813-HOPE (985) 086-1173) ? [place] Assists [place] cancer patients and their families through emotional , educational and financial support.  605-796-9220 ? [place] DSS Where to apply for food stamps, Medicaid and utility assistance. 418-233-9824 ? RCATS: Transportation to medical appointments. 606 117 8742 ? Social Security Administration: May apply for disability if have a Stage IV cancer. (602)387-1324 (619) 048-2640 ? [employer], Disability and Transit Services: Assists with nutrition, care and transit needs. [place] Support Programs: @10RELATIVEDAYS @ > Cancer Support Group  2nd Tuesday of the month 1pm-2pm, Journey Room  > Creative Journey  3rd Tuesday of the month 1130am-1pm, Journey Room  > Look Good Feel Better  1st Wednesday of the month 10am-12 noon, Journey Room (Call [place] to register [phone])

## 2016-11-02 NOTE — Progress Notes (Signed)
HEMATOLOGY/ONCOLOGY PROGRESS NOTE  Date of Service: 11/02/2016  Patient Care Team: Glenda Chroman, MD as PCP - General (Internal Medicine) Daneil Dolin, MD (Gastroenterology)  CHIEF COMPLAINTS/PURPOSE OF CONSULTATION:  Anemia  HISTORY OF PRESENTING ILLNESS:  Alisha [name] is a wonderful 66 y.o. female who returns for management of anemia.  Patient was recently noted to have severe iron deficiency with labs on 05/04/2016 showing hemoglobin of 5.6 with an MCV of 62.6, WBC count of 2.7k and platelets of 51k. Ferritin level done at the time was 2 with an iron saturation of 4%.   She has been doing well. She has received another dose of feraheme on 09/29/16. Denies easy bleeding, melena, blood in stool, fatigue, pica, hematuria, tingling, abdominal pain, leg swelling, or any other concerns. She has retired since her last visit.   MEDICAL HISTORY:  Past Medical History:  Diagnosis Date  . Barrett's esophagus   . Cirrhosis of liver without mention of alcohol    hep B surface antigen and HCV ab negative.pt has not had hepatitis A and B vaccines.U/S on 07/04/13 shows cirrhosis.  . Diverticula of colon    pancolonic  . Esophageal reflux   . Esophageal varices ([place])   . Hiatal hernia   . Iron deficiency anemia, unspecified   . Other and unspecified hyperlipidemia   . Portal hypertensive gastropathy   . Tubular adenoma   . Type II or unspecified type diabetes mellitus without mention of complication, not stated as uncontrolled   . Unspecified essential hypertension   . Unspecified hemorrhoids without mention of complication     SURGICAL HISTORY: Past Surgical History:  Procedure Laterality Date  . ABDOMINAL HYSTERECTOMY    . BIOPSY  04/30/2016   Procedure: BIOPSY;  Surgeon: Daneil Dolin, MD;  Location: [facility];  Service: Endoscopy;;  esophagus  . CESAREAN SECTION     x 2  . COLONOSCOPY  03/2006   left sided diverticula, splenic flexure tublar adenoma  . COLONOSCOPY   07/2002   villous tubular adenoma in rectum  . COLONOSCOPY  09/23/09   external hemorrhoids/scattered pan colonic diverticula otherwise normal. cecal lipoma bx negative, normal TI. Next TCS 09/2014  . COLONOSCOPY N/A 11/12/2014   Procedure: COLONOSCOPY;  Surgeon: Daneil Dolin, MD;  Location: [facility];  Service: Endoscopy;  Laterality: N/A;  1215 - moved to 12:30 - [place] to notify pt  . ESOPHAGOGASTRODUODENOSCOPY  08/2006   barretts esophagus, no dyplasia  . ESOPHAGOGASTRODUODENOSCOPY  03/2006   barretts esophagus,bx focal atypia c/w low grade dysplasia, SB bx negative for celiac  . ESOPHAGOGASTRODUODENOSCOPY  09/23/09   3 columns of grade 2 esophageal varices/hiatal hernia/3-4 cm segment Barrett's esophagus without dysplasia. Next EGD 09/2012  . ESOPHAGOGASTRODUODENOSCOPY N/A 11/11/2012   Dr. Gala Romney- Barrett;s esophagus, esophageal varices, portal gastopathy. hiatal hernia  . ESOPHAGOGASTRODUODENOSCOPY N/A 04/30/2016   Procedure: ESOPHAGOGASTRODUODENOSCOPY (EGD);  Surgeon: Daneil Dolin, MD;  Location: [facility];  Service: Endoscopy;  Laterality: N/A;  815  . [place]  2008  . small bowel capsule endoscopy  10/2009   normal    SOCIAL HISTORY: Social History   Social History  . Marital status: Married    Spouse name: N/A  . Number of children: N/A  . Years of education: N/A   Occupational History  . Not on file.   Social History Main Topics  . Smoking status: Never Smoker  . Smokeless tobacco: Never Used  . Alcohol use No  . Drug  use: No  . Sexual activity: Yes   Other Topics Concern  . Not on file   Social History Narrative  . No narrative on file    FAMILY HISTORY: Family History  Problem Relation Age of Onset  . Alzheimer's disease Mother   . CAD Father   . Diabetes Mellitus II Father   . Alzheimer's disease Father   . Diabetes Mellitus II Sister   . Cancer - Other Brother   . Diabetes Mellitus II Brother   . Hypertension Brother      ALLERGIES:  is allergic to codeine.  MEDICATIONS:  Current Outpatient Prescriptions  Medication Sig Dispense Refill  . GLIPIZIDE XL 10 MG 24 hr tablet Take 10 mg by mouth daily.     Marland Kitchen lisinopril (PRINIVIL,ZESTRIL) 10 MG tablet Take 10 mg by mouth daily.     . metFORMIN (GLUCOPHAGE) 1000 MG tablet Take 1,000 mg by mouth 2 (two) times daily with a meal.     . NEXIUM 40 MG capsule Take 40 mg by mouth daily before breakfast.     . propranolol (INDERAL) 10 MG tablet Take 3 tablets in the morning and 2 in the evening 150 tablet 11  . VICTOZA 18 MG/3ML SOLN 1.8 mg daily.      No current facility-administered medications for this visit.     REVIEW OF SYSTEMS:   Review of Systems  Constitutional: Negative.  Negative for malaise/fatigue.       No pica  HENT: Negative.   Eyes: Negative.   Respiratory: Negative.   Cardiovascular: Negative.  Negative for leg swelling.  Gastrointestinal: Negative.  Negative for abdominal pain, blood in stool and melena.  Genitourinary: Negative.  Negative for hematuria.  Musculoskeletal: Negative.   Skin: Negative.   Neurological: Negative.  Negative for tingling.  Endo/Heme/Allergies: Negative.  Does not bruise/bleed easily.  Psychiatric/Behavioral: Negative.   All other systems reviewed and are negative. 14 Point review of Systems was done is negative except as noted above.  PHYSICAL EXAMINATION: ECOG PERFORMANCE STATUS: 1 - Symptomatic but completely ambulatory  Vitals:   11/02/16 1127  BP: (!) 146/67  Pulse: 78  Resp: 16  Temp: 97.9 F (36.6 C)   Filed Weights   11/02/16 1127  Weight: 176 lb 3.2 oz (79.9 kg)   .Body mass index is 24.57 kg/m.   Physical Exam  Constitutional: She is oriented to person, place, and time and well-developed, well-nourished, and in no distress.  HENT:  Head: Normocephalic and atraumatic.  Eyes: EOM are normal. Pupils are equal, round, and reactive to light.  Neck: Normal range of motion. Neck supple.   Cardiovascular: Normal rate and regular rhythm.   Pulmonary/Chest: Effort normal and breath sounds normal.  Abdominal: Soft. Bowel sounds are normal.  Musculoskeletal: Normal range of motion.  Neurological: She is alert and oriented to person, place, and time. Gait normal.  Skin: Skin is warm and dry.  Nursing note and vitals reviewed.  LABORATORY DATA:  I have reviewed the data as listed  CBC Latest Ref Rng & Units 09/15/2016 08/03/2016 06/03/2016  WBC 4.0 - 10.5 K/uL 3.2(L) 3.4(L) 3.6(L)  Hemoglobin 12.0 - 15.0 g/dL 12.1 11.9(L) 7.7(L)  Hematocrit 36.0 - 46.0 % 35.9(L) 36.6 27.5(L)  Platelets 150 - 400 K/uL 40(L) 48(L) 74(L)    . CMP Latest Ref Rng & Units 11/02/2016 06/03/2016 05/04/2016  Glucose 65 - 99 mg/dL 256(H) 214(H) 221(H)  BUN 6 - 20 mg/dL 8 9 9   Creatinine 0.44 - 1.00 mg/dL  0.74 0.71 0.78  Sodium 135 - 145 mmol/L 135 137 137  Potassium 3.5 - 5.1 mmol/L 4.4 4.7 4.6  Chloride 101 - 111 mmol/L 102 107 107  CO2 22 - 32 mmol/L 24 24 21   Calcium 8.9 - 10.3 mg/dL 9.4 8.8(L) 8.8  Total Protein 6.5 - 8.1 g/dL 7.1 6.8 6.6  Total Bilirubin 0.3 - 1.2 mg/dL 2.3(H) 2.1(H) 2.3(H)  Alkaline Phos 38 - 126 U/L 66 70 65  AST 15 - 41 U/L 36 23 15  ALT 14 - 54 U/L 26 14 8     PATHOLOGY:   RADIOGRAPHIC STUDIES: I have personally reviewed the radiological images as listed and agreed with the findings in the report.  US Abdomen 05/04/2016 IMPRESSION: 1. Cirrhotic changes within the liver without evidence of suspicious masses. No ascites. 2. Splenomegaly which has increased since the previous study. 3. Gallstones without sonographic evidence of acute cholecystitis.  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 With recent hemoglobin of 5.6 and an MCV of 62 status post PRBC 2. Ferritin of 2 with iron saturation 4% #2 severe iron deficiency likely due to chronic GI losses due to multiple GI pathologies that would be risk factors for bleeding. She also is on chronic PPI therapy for more than 5 years for Barrett's esophagus which might reduce her iron absorption. She also has thrombocytopenia and mild coagulopathy related liver disease. She has been intolerant to oral iron with GI distress even with small doses. 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 #5 liver cirrhosis likely related to [facility] pending. I will call her with these results and let her know if she needs more iron replacement.   She will return for follow up in 3 months with labs; CBC, CMP, iron studies.   All of the patients questions were answered with apparent satisfaction. The patient knows to call the clinic with any problems, questions or concerns.  This document serves as a record of services personally performed by Twana First, MD. It was created on her behalf by Martinique Casey, a trained medical scribe. The creation of this record is based on the scribe's personal observations and the provider's statements to them. This document has been checked and approved by the attending provider.  I have reviewed the above documentation for accuracy and completeness and I agree with the above.  This note was electronically signed.  Martinique M Casey  11/02/2016 11:29 AM

## 2016-11-03 ENCOUNTER — Encounter (HOSPITAL_COMMUNITY): Payer: Self-pay | Admitting: Oncology

## 2016-11-03 ENCOUNTER — Other Ambulatory Visit (HOSPITAL_COMMUNITY): Payer: Self-pay | Admitting: Oncology

## 2016-11-03 DIAGNOSIS — E538 Deficiency of other specified B group vitamins: Secondary | ICD-10-CM

## 2016-11-03 HISTORY — DX: Deficiency of other specified B group vitamins: E53.8

## 2016-11-04 ENCOUNTER — Telehealth: Payer: Self-pay | Admitting: Internal Medicine

## 2016-11-04 ENCOUNTER — Other Ambulatory Visit: Payer: Self-pay

## 2016-11-04 DIAGNOSIS — K7460 Unspecified cirrhosis of liver: Secondary | ICD-10-CM

## 2016-11-04 NOTE — Telephone Encounter (Signed)
Pt is set up for [name] on 11/12/16 @ 9:30 am.

## 2016-11-04 NOTE — Telephone Encounter (Signed)
PATIENT CALLED TO SET UP HER ULTRASOUND

## 2016-11-04 NOTE — Telephone Encounter (Signed)
She is aware

## 2016-11-05 ENCOUNTER — Other Ambulatory Visit: Payer: Self-pay | Admitting: Gastroenterology

## 2016-11-05 DIAGNOSIS — K7460 Unspecified cirrhosis of liver: Secondary | ICD-10-CM

## 2016-11-05 NOTE — Progress Notes (Signed)
Reviewed other labs that were ordered under hematology. Her MELD is 14. She is due for abd u/s which I believe is scheduled. Keep upcoming ov with rmr next month.  Plan for labs in 6 months: PT/INR, CMET

## 2016-11-10 ENCOUNTER — Encounter (HOSPITAL_BASED_OUTPATIENT_CLINIC_OR_DEPARTMENT_OTHER): Payer: Medicare Other

## 2016-11-10 ENCOUNTER — Encounter (HOSPITAL_COMMUNITY): Payer: Medicare Other

## 2016-11-10 ENCOUNTER — Encounter (HOSPITAL_COMMUNITY): Payer: Self-pay

## 2016-11-10 VITALS — BP 118/56 | HR 71 | Temp 98.4°F | Resp 18

## 2016-11-10 DIAGNOSIS — E538 Deficiency of other specified B group vitamins: Secondary | ICD-10-CM | POA: Diagnosis not present

## 2016-11-10 DIAGNOSIS — D508 Other iron deficiency anemias: Secondary | ICD-10-CM

## 2016-11-10 LAB — COMPREHENSIVE METABOLIC PANEL
ALBUMIN: 4 g/dL (ref 3.5–5.0)
ALK PHOS: 70 U/L (ref 38–126)
ALT: 22 U/L (ref 14–54)
AST: 30 U/L (ref 15–41)
Anion gap: 8 (ref 5–15)
BILIRUBIN TOTAL: 2.5 mg/dL — AB (ref 0.3–1.2)
BUN: 8 mg/dL (ref 6–20)
CO2: 25 mmol/L (ref 22–32)
Calcium: 9.1 mg/dL (ref 8.9–10.3)
Chloride: 103 mmol/L (ref 101–111)
Creatinine, Ser: 0.8 mg/dL (ref 0.44–1.00)
GFR calc Af Amer: >60 mL/min (ref >60)
GLUCOSE: 285 mg/dL — AB (ref 65–99)
POTASSIUM: 4.4 mmol/L (ref 3.5–5.1)
Sodium: 136 mmol/L (ref 135–145)
TOTAL PROTEIN: 7.4 g/dL (ref 6.5–8.1)

## 2016-11-10 LAB — IRON AND TIBC
IRON: 61 ug/dL (ref 28–170)
Saturation Ratios: 22 % (ref 10.4–31.8)
TIBC: 281 ug/dL (ref 250–450)
UIBC: 220 ug/dL

## 2016-11-10 LAB — CBC WITH DIFFERENTIAL/PLATELET
BASOS ABS: 0 10*3/uL (ref 0.0–0.1)
BASOS PCT: 0 %
Eosinophils Absolute: 0.2 10*3/uL (ref 0.0–0.7)
Eosinophils Relative: 4 %
HEMATOCRIT: 39.3 % (ref 36.0–46.0)
Hemoglobin: 13.5 g/dL (ref 12.0–15.0)
LYMPHS PCT: 25 %
Lymphs Abs: 1.3 10*3/uL (ref 0.7–4.0)
MCH: 30.8 pg (ref 26.0–34.0)
MCHC: 34.4 g/dL (ref 30.0–36.0)
MCV: 89.5 fL (ref 78.0–100.0)
MONO ABS: 0.4 10*3/uL (ref 0.1–1.0)
Monocytes Relative: 7 %
NEUTROS PCT: 65 %
Neutro Abs: 3.4 10*3/uL (ref 1.7–7.7)
Platelets: 55 10*3/uL — ABNORMAL LOW (ref 150–400)
RBC: 4.39 MIL/uL (ref 3.87–5.11)
RDW: 14.2 % (ref 11.5–15.5)
WBC: 5.2 10*3/uL (ref 4.0–10.5)

## 2016-11-10 LAB — FERRITIN: Ferritin: 143 ng/mL (ref 11–307)

## 2016-11-10 MED ORDER — CYANOCOBALAMIN 1000 MCG/ML IJ SOLN
INTRAMUSCULAR | Status: AC
  Filled 2016-11-10: qty 1

## 2016-11-10 MED ORDER — CYANOCOBALAMIN 1000 MCG/ML IJ SOLN
1000.0000 ug | Freq: Once | INTRAMUSCULAR | Status: AC
  Administered 2016-11-10: 1000 ug via INTRAMUSCULAR

## 2016-11-10 NOTE — Patient Instructions (Signed)
[facility] at [facility] Discharge Instructions  RECOMMENDATIONS MADE BY THE CONSULTANT AND ANY TEST RESULTS WILL BE SENT TO YOUR REFERRING PHYSICIAN.  Received Vit B12 injection today. Follow-up as scheduled. Call clinic for any questions or concerns  Thank you for choosing [facility] at [facility] to provide your oncology and hematology care.  To afford each patient quality time with our provider, please arrive at least 15 minutes before your scheduled appointment time.    If you have a lab appointment with the Cancer Center please come in thru the  Main Entrance and check in at the main information desk  You need to re-schedule your appointment should you arrive 10 or more minutes late.  We strive to give you quality time with our providers, and arriving late affects you and other patients whose appointments are after yours.  Also, if you no show three or more times for appointments you may be dismissed from the clinic at the providers discretion.     Again, thank you for choosing [facility].  Our hope is that these requests will decrease the amount of time that you wait before being seen by our physicians.       _____________________________________________________________  Should you have questions after your visit to [facility], please contact our office at [phone] between the hours of 8:30 a.m. and 4:30 p.m.  Voicemails left after 4:30 p.m. will not be returned until the following business day.  For prescription refill requests, have your pharmacy contact our office.       Resources For Cancer Patients and their Caregivers ? American Cancer Society: Can assist with transportation, wigs, general needs, runs Look Good Feel Better.        1-888-227-6333 ? Cancer Care: Provides financial assistance, online support groups, medication/co-pay assistance.  1-800-813-HOPE (4673) ? [facility] Assists [place] cancer patients and their families through emotional , educational and financial support.  336-427-4357 ? [place] DSS Where to apply for food stamps, Medicaid and utility assistance. 336-342-1394 ? RCATS: Transportation to medical appointments. 336-347-2287 ? Social Security Administration: May apply for disability if have a Stage IV cancer. 336-342-7796 1-800-772-1213 ? [place] Aging, Disability and Transit Services: Assists with nutrition, care and transit needs. 336-349-2343  Cancer Center Support Programs: @10RELATIVEDAYS@ > Cancer Support Group  2nd Tuesday of the month 1pm-2pm, Journey Room  > Creative Journey  3rd Tuesday of the month 1130am-1pm, Journey Room  > Look Good Feel Better  1st Wednesday of the month 10am-12 noon, Journey Room (Call [employer] to register [phone])   

## 2016-11-10 NOTE — Progress Notes (Signed)
Alisha [name] tolerated Vit B12 injection well without complaints or incident VSS Pt discharged self ambulatory in satisfactory condition accompanied by her husband

## 2016-11-12 ENCOUNTER — Ambulatory Visit (HOSPITAL_COMMUNITY)
Admission: RE | Admit: 2016-11-12 | Discharge: 2016-11-12 | Disposition: A | Discharge status: Home / Self Care | Payer: Medicare Other | Source: Ambulatory Visit | Attending: Gastroenterology | Admitting: Gastroenterology

## 2016-11-12 DIAGNOSIS — R161 Splenomegaly, not elsewhere classified: Secondary | ICD-10-CM | POA: Diagnosis not present

## 2016-11-12 DIAGNOSIS — K8020 Calculus of gallbladder without cholecystitis without obstruction: Secondary | ICD-10-CM | POA: Insufficient documentation

## 2016-11-12 DIAGNOSIS — K7460 Unspecified cirrhosis of liver: Secondary | ICD-10-CM

## 2016-11-17 ENCOUNTER — Encounter (HOSPITAL_COMMUNITY): Payer: Self-pay

## 2016-11-17 ENCOUNTER — Encounter (HOSPITAL_COMMUNITY): Payer: Medicare Other | Attending: Hematology

## 2016-11-17 VITALS — BP 121/51 | HR 79 | Temp 97.9°F | Resp 18

## 2016-11-17 DIAGNOSIS — E538 Deficiency of other specified B group vitamins: Secondary | ICD-10-CM

## 2016-11-17 DIAGNOSIS — D696 Thrombocytopenia, unspecified: Secondary | ICD-10-CM | POA: Insufficient documentation

## 2016-11-17 DIAGNOSIS — E119 Type 2 diabetes mellitus without complications: Secondary | ICD-10-CM | POA: Insufficient documentation

## 2016-11-17 DIAGNOSIS — K2270 Barrett's esophagus without dysplasia: Secondary | ICD-10-CM | POA: Insufficient documentation

## 2016-11-17 DIAGNOSIS — D689 Coagulation defect, unspecified: Secondary | ICD-10-CM | POA: Insufficient documentation

## 2016-11-17 DIAGNOSIS — Z79899 Other long term (current) drug therapy: Secondary | ICD-10-CM | POA: Insufficient documentation

## 2016-11-17 DIAGNOSIS — D509 Iron deficiency anemia, unspecified: Secondary | ICD-10-CM | POA: Insufficient documentation

## 2016-11-17 DIAGNOSIS — K7689 Other specified diseases of liver: Secondary | ICD-10-CM | POA: Insufficient documentation

## 2016-11-17 DIAGNOSIS — K7460 Unspecified cirrhosis of liver: Secondary | ICD-10-CM | POA: Insufficient documentation

## 2016-11-17 DIAGNOSIS — R161 Splenomegaly, not elsewhere classified: Secondary | ICD-10-CM | POA: Insufficient documentation

## 2016-11-17 DIAGNOSIS — Z7984 Long term (current) use of oral hypoglycemic drugs: Secondary | ICD-10-CM | POA: Insufficient documentation

## 2016-11-17 DIAGNOSIS — K5730 Diverticulosis of large intestine without perforation or abscess without bleeding: Secondary | ICD-10-CM | POA: Insufficient documentation

## 2016-11-17 MED ORDER — CYANOCOBALAMIN 1000 MCG/ML IJ SOLN
1000.0000 ug | Freq: Once | INTRAMUSCULAR | Status: AC
  Administered 2016-11-17: 1000 ug via INTRAMUSCULAR
  Filled 2016-11-17: qty 1

## 2016-11-17 NOTE — Patient Instructions (Signed)
[place] at [facility] Discharge Instructions  RECOMMENDATIONS MADE BY THE CONSULTANT AND ANY TEST RESULTS WILL BE SENT TO YOUR REFERRING PHYSICIAN.  B12 injection today. Return as scheduled.   Thank you for choosing [place] at [facility] to provide your oncology and hematology care.  To afford each patient quality time with our provider, please arrive at least 15 minutes before your scheduled appointment time.    If you have a lab appointment with the [employer] please come in thru the  Main Entrance and check in at the main information desk  You need to re-schedule your appointment should you arrive 10 or more minutes late.  We strive to give you quality time with our providers, and arriving late affects you and other patients whose appointments are after yours.  Also, if you no show three or more times for appointments you may be dismissed from the clinic at the providers discretion.     Again, thank you for choosing [facility].  Our hope is that these requests will decrease the amount of time that you wait before being seen by our physicians.       _____________________________________________________________  Should you have questions after your visit to [facility], please contact our office at [phone] between the hours of 8:30 a.m. and 4:30 p.m.  Voicemails left after 4:30 p.m. will not be returned until the following business day.  For prescription refill requests, have your pharmacy contact our office.       Resources For Cancer Patients and their Caregivers ? American Cancer Society: Can assist with transportation, wigs, general needs, runs Look Good Feel Better.        9590841577 ? Cancer Care: Provides financial assistance, online support groups, medication/co-pay assistance.  1-800-813-HOPE (507) 261-9564) ? [place] Assists [place] cancer patients and their  families through emotional , educational and financial support.  3645682144 ? [place] DSS Where to apply for food stamps, Medicaid and utility assistance. (541) 555-8134 ? RCATS: Transportation to medical appointments. 5671258011 ? Social Security Administration: May apply for disability if have a Stage IV cancer. (315) 861-9461 681-421-4823 ? [employer], Disability and Transit Services: Assists with nutrition, care and transit needs. [place] Support Programs: @10RELATIVEDAYS @ > Cancer Support Group  2nd Tuesday of the month 1pm-2pm, Journey Room  > Creative Journey  3rd Tuesday of the month 1130am-1pm, Journey Room  > Look Good Feel Better  1st Wednesday of the month 10am-12 noon, Journey Room (Call [place] to register [phone])

## 2016-11-17 NOTE — Progress Notes (Signed)
Alisha [name] presents today for injection per the provider's orders.  B12 administration without incident; see MAR for injection details.  Patient tolerated procedure well and without incident.  No questions or complaints noted at this time.

## 2016-11-24 ENCOUNTER — Encounter (HOSPITAL_BASED_OUTPATIENT_CLINIC_OR_DEPARTMENT_OTHER): Payer: Medicare Other

## 2016-11-24 ENCOUNTER — Encounter (HOSPITAL_COMMUNITY): Payer: Self-pay

## 2016-11-24 VITALS — BP 119/53 | HR 75 | Temp 98.1°F | Resp 18

## 2016-11-24 DIAGNOSIS — E538 Deficiency of other specified B group vitamins: Secondary | ICD-10-CM | POA: Diagnosis not present

## 2016-11-24 MED ORDER — CYANOCOBALAMIN 1000 MCG/ML IJ SOLN
1000.0000 ug | Freq: Once | INTRAMUSCULAR | Status: AC
  Administered 2016-11-24: 1000 ug via INTRAMUSCULAR
  Filled 2016-11-24: qty 1

## 2016-11-24 NOTE — Patient Instructions (Signed)
[place] at [facility] Discharge Instructions  RECOMMENDATIONS MADE BY THE CONSULTANT AND ANY TEST RESULTS WILL BE SENT TO YOUR REFERRING PHYSICIAN.  B12 today.    Thank you for choosing [place] at [facility] to provide your oncology and hematology care.  To afford each patient quality time with our provider, please arrive at least 15 minutes before your scheduled appointment time.    If you have a lab appointment with the [place] please come in thru the  Main Entrance and check in at the main information desk  You need to re-schedule your appointment should you arrive 10 or more minutes late.  We strive to give you quality time with our providers, and arriving late affects you and other patients whose appointments are after yours.  Also, if you no show three or more times for appointments you may be dismissed from the clinic at the providers discretion.     Again, thank you for choosing [facility].  Our hope is that these requests will decrease the amount of time that you wait before being seen by our physicians.       _____________________________________________________________  Should you have questions after your visit to [facility], please contact our office at [phone] between the hours of 8:30 a.m. and 4:30 p.m.  Voicemails left after 4:30 p.m. will not be returned until the following business day.  For prescription refill requests, have your pharmacy contact our office.       Resources For Cancer Patients and their Caregivers ? American Cancer Society: Can assist with transportation, wigs, general needs, runs Look Good Feel Better.        610-704-4155 ? Cancer Care: Provides financial assistance, online support groups, medication/co-pay assistance.  1-800-813-HOPE 234 813 5207) ? [place] Assists [place] cancer patients and their families through emotional ,  educational and financial support.  402-516-3879 ? [place] DSS Where to apply for food stamps, Medicaid and utility assistance. 315-053-7361 ? RCATS: Transportation to medical appointments. 364-862-7926 ? Social Security Administration: May apply for disability if have a Stage IV cancer. 3802756180 678-170-9896 ? [employer], Disability and Transit Services: Assists with nutrition, care and transit needs. [place] Support Programs: @10RELATIVEDAYS @ > Cancer Support Group  2nd Tuesday of the month 1pm-2pm, Journey Room  > Creative Journey  3rd Tuesday of the month 1130am-1pm, Journey Room  > Look Good Feel Better  1st Wednesday of the month 10am-12 noon, Journey Room (Call [place] to register [phone])

## 2016-11-24 NOTE — Progress Notes (Signed)
Please let patient know that her liver is stable. No hepatomas.  She does have increase in CBD diameter from 2.36m to 938min the last 6 months. No known narcotics.  Meld 12 last month.  SHE HAS UPCOMING APPT WITH [facility] NEXT WEEK SHE NEEDS TO KEEP.  NEXT ABD U/S IN 6 MONTHS, HEPATOMA SCREENING  Dr. RoGala Romneyshe is seeing you next week, may need further evaluation due to change in CBD diameter.

## 2016-11-24 NOTE — Progress Notes (Signed)
Alisha [name] presents today for injection per MD orders. B12 1043mg administered IM in right Upper Arm. Administration without incident. Patient tolerated well.

## 2016-11-26 NOTE — Progress Notes (Signed)
Tried to call, Vm not set up.

## 2016-11-26 NOTE — Progress Notes (Signed)
Called home, many rings and no answer.

## 2016-12-01 ENCOUNTER — Other Ambulatory Visit: Payer: Self-pay

## 2016-12-01 ENCOUNTER — Encounter (HOSPITAL_BASED_OUTPATIENT_CLINIC_OR_DEPARTMENT_OTHER): Payer: Medicare Other

## 2016-12-01 ENCOUNTER — Encounter (HOSPITAL_COMMUNITY): Payer: Self-pay

## 2016-12-01 ENCOUNTER — Ambulatory Visit (INDEPENDENT_AMBULATORY_CARE_PROVIDER_SITE_OTHER): Payer: Medicare Other | Admitting: Internal Medicine

## 2016-12-01 VITALS — BP 115/61 | HR 73 | Temp 97.8°F | Resp 18

## 2016-12-01 VITALS — BP 129/71 | HR 77 | Temp 97.8°F | Ht 61.0 in | Wt 178.4 lb

## 2016-12-01 DIAGNOSIS — C220 Liver cell carcinoma: Secondary | ICD-10-CM

## 2016-12-01 DIAGNOSIS — K7460 Unspecified cirrhosis of liver: Secondary | ICD-10-CM | POA: Diagnosis not present

## 2016-12-01 DIAGNOSIS — K2270 Barrett's esophagus without dysplasia: Secondary | ICD-10-CM | POA: Diagnosis not present

## 2016-12-01 DIAGNOSIS — E538 Deficiency of other specified B group vitamins: Secondary | ICD-10-CM | POA: Diagnosis not present

## 2016-12-01 DIAGNOSIS — Z86010 Personal history of colonic polyps: Secondary | ICD-10-CM

## 2016-12-01 DIAGNOSIS — K219 Gastro-esophageal reflux disease without esophagitis: Secondary | ICD-10-CM

## 2016-12-01 DIAGNOSIS — K7469 Other cirrhosis of liver: Secondary | ICD-10-CM

## 2016-12-01 DIAGNOSIS — K838 Other specified diseases of biliary tract: Secondary | ICD-10-CM

## 2016-12-01 DIAGNOSIS — K7581 Nonalcoholic steatohepatitis (NASH): Secondary | ICD-10-CM | POA: Diagnosis not present

## 2016-12-01 MED ORDER — CYANOCOBALAMIN 1000 MCG/ML IJ SOLN: 1000.0000 ug | Freq: Once | INTRAMUSCULAR | Status: DC

## 2016-12-01 MED ORDER — CYANOCOBALAMIN 1000 MCG/ML IJ SOLN
1000.0000 ug | Freq: Once | INTRAMUSCULAR | Status: AC
  Administered 2016-12-01: 1000 ug via INTRAMUSCULAR
  Filled 2016-12-01: qty 1

## 2016-12-01 NOTE — Patient Instructions (Signed)
Attempted to submit PA for MRI/MRCP via [facility] website, no PA needed.

## 2016-12-01 NOTE — Patient Instructions (Signed)
[place] at [facility] Discharge Instructions  RECOMMENDATIONS MADE BY THE CONSULTANT AND ANY TEST RESULTS WILL BE SENT TO YOUR REFERRING PHYSICIAN.  B12 injection given today. Follow up as scheduled.  Thank you for choosing [place] at [facility] to provide your oncology and hematology care.  To afford each patient quality time with our provider, please arrive at least 15 minutes before your scheduled appointment time.    If you have a lab appointment with the [place] please come in thru the  Main Entrance and check in at the main information desk  You need to re-schedule your appointment should you arrive 10 or more minutes late.  We strive to give you quality time with our providers, and arriving late affects you and other patients whose appointments are after yours.  Also, if you no show three or more times for appointments you may be dismissed from the clinic at the providers discretion.     Again, thank you for choosing [facility].  Our hope is that these requests will decrease the amount of time that you wait before being seen by our physicians.       _____________________________________________________________  Should you have questions after your visit to [facility] please contact our office at [phone] between the hours of 8:30 a.m. and 4:30 p.m.  Voicemails left after 4:30 p.m. will not be returned until the following business day.  For prescription refill requests, have your pharmacy contact our office.       Resources For Cancer Patients and their Caregivers ? American Cancer Society: Can assist with transportation, wigs, general needs, runs Look Good Feel Better.        551-515-5298 ? Cancer Care: Provides financial assistance, online support groups, medication/co-pay assistance.  1-800-813-HOPE (601)447-3771) ? [place] Assists [place] cancer patients  and their families through emotional , educational and financial support.  (343)455-1338 ? [place] DSS Where to apply for food stamps, Medicaid and utility assistance. 919-400-4552 ? RCATS: Transportation to medical appointments. 7201614240 ? Social Security Administration: May apply for disability if have a Stage IV cancer. [phone] [phone] ? [employer], Disability and Transit Services: Assists with nutrition, care and transit needs. [place] Support Programs: @10RELATIVEDAYS @ > Cancer Support Group  2nd Tuesday of the month 1pm-2pm, Journey Room  > Creative Journey  3rd Tuesday of the month 1130am-1pm, Journey Room  > Look Good Feel Better  1st Wednesday of the month 10am-12 noon, Journey Room (Call [place] to register [phone])

## 2016-12-01 NOTE — Progress Notes (Signed)
Primary Care Physician:  Glenda Chroman, MD Primary Gastroenterologist:  Dr. Gala Romney  Pre-Procedure History & Physical: HPI:  Alisha [name] is a 66 y.o. female here for follow-up Nash/cirrhosis. GERD. Since last screening,  bile duct almost 9 mm  -. up from 2.9 mm 6 months earlier.   Patient has known cholelithiasis. Comp metabolic profile last month demonstrated a total in mid 2 range  -  no fractionation. LFTs otherwise normal. She has B12 deficiency and is getting supplementation. Iron deficiency as well. No melena or hematochezia. History of high-grade adenomas removed from her colon; due for surveillance  2021. 3 cm segment of Barrett's esophagus noted previously. Primary prophylaxis with Inderal for known EV.Marland Kitchen She is tolerating it well. Our data base needs to be updated - she states she did get hepatitis A and B vaccination.    Past Medical History:  Diagnosis Date  . B12 deficiency 11/03/2016  . B12 deficiency 11/03/2016  . Barrett's esophagus   . Cirrhosis of liver without mention of alcohol    hep B surface antigen and HCV ab negative.pt has not had hepatitis A and B vaccines.U/S on 07/04/13 shows cirrhosis.  . Diverticula of colon    pancolonic  . Esophageal reflux   . Esophageal varices ([place])   . Hiatal hernia   . Iron deficiency anemia, unspecified   . Other and unspecified hyperlipidemia   . Portal hypertensive gastropathy   . Tubular adenoma   . Type II or unspecified type diabetes mellitus without mention of complication, not stated as uncontrolled   . Unspecified essential hypertension   . Unspecified hemorrhoids without mention of complication     Past Surgical History:  Procedure Laterality Date  . ABDOMINAL HYSTERECTOMY    . BIOPSY  04/30/2016   Procedure: BIOPSY;  Surgeon: Daneil Dolin, MD;  [facility];  Service: Endoscopy;;  esophagus  . CESAREAN SECTION     x 2  . COLONOSCOPY  03/2006   left sided diverticula, splenic flexure tublar  adenoma  . COLONOSCOPY  07/2002   villous tubular adenoma in rectum  . COLONOSCOPY  09/23/09   external hemorrhoids/scattered pan colonic diverticula otherwise normal. cecal lipoma bx negative, normal TI. Next TCS 09/2014  . COLONOSCOPY N/A 11/12/2014   Procedure: COLONOSCOPY;  Surgeon: Daneil Dolin, MD;  Location: [facility];  Service: Endoscopy;  Laterality: N/A;  1215 - moved to 12:30 - [place] to notify pt  . ESOPHAGOGASTRODUODENOSCOPY  08/2006   barretts esophagus, no dyplasia  . ESOPHAGOGASTRODUODENOSCOPY  03/2006   barretts esophagus,bx focal atypia c/w low grade dysplasia, SB bx negative for celiac  . ESOPHAGOGASTRODUODENOSCOPY  09/23/09   3 columns of grade 2 esophageal varices/hiatal hernia/3-4 cm segment Barrett's esophagus without dysplasia. Next EGD 09/2012  . ESOPHAGOGASTRODUODENOSCOPY N/A 11/11/2012   Dr. Gala Romney- Barrett;s esophagus, esophageal varices, portal gastopathy. hiatal hernia  . ESOPHAGOGASTRODUODENOSCOPY N/A 04/30/2016   Procedure: ESOPHAGOGASTRODUODENOSCOPY (EGD);  Surgeon: Daneil Dolin, MD;  Location: [facility];  Service: Endoscopy;  Laterality: N/A;  815  . [place]  2008  . small bowel capsule endoscopy  10/2009   normal    Prior to Admission medications   Medication Sig Start Date End Date Taking? Authorizing Provider  cyanocobalamin (,VITAMIN B-12,) 1000 MCG/ML injection Inject 1,000 mcg into the muscle every 30 (thirty) days.   Yes Historical Provider, MD  GLIPIZIDE XL 10 MG 24 hr tablet Take 10 mg by mouth daily.  02/24/12  Yes  Historical Provider, MD  lisinopril (PRINIVIL,ZESTRIL) 10 MG tablet Take 10 mg by mouth daily.  04/09/12  Yes Historical Provider, MD  metFORMIN (GLUCOPHAGE) 1000 MG tablet Take 1,000 mg by mouth 2 (two) times daily with a meal.  02/09/12  Yes Historical Provider, MD  NEXIUM 40 MG capsule Take 40 mg by mouth daily before breakfast.  04/14/12  Yes Historical Provider, MD  propranolol (INDERAL) 10 MG tablet Take 3 tablets in  the morning and 2 in the evening 05/22/16  Yes Annitta Needs, NP  VICTOZA 18 MG/3ML SOLN 1.8 mg daily.  04/15/12  Yes Historical Provider, MD    Allergies as of 12/01/2016 - Review Complete 12/01/2016  Allergen Reaction Noted  . Codeine Nausea And Vomiting 04/19/2012    Family History  Problem Relation Age of Onset  . Alzheimer's disease Mother   . CAD Father   . Diabetes Mellitus II Father   . Alzheimer's disease Father   . Diabetes Mellitus II Sister   . Cancer - Other Brother   . Diabetes Mellitus II Brother   . Hypertension Brother     Social History   Social History  . Marital status: Married    Spouse name: N/A  . Number of children: N/A  . Years of education: N/A   Occupational History  . Not on file.   Social History Main Topics  . Smoking status: Never Smoker  . Smokeless tobacco: Never Used  . Alcohol use No  . Drug use: No  . Sexual activity: Yes   Other Topics Concern  . Not on file   Social History Narrative  . No narrative on file    Review of Systems: See HPI, otherwise negative ROS  Physical Exam: BP 129/71   Pulse 77   Temp 97.8 F (36.6 C) (Oral)   Ht 5' 1"  (1.549 m)   Wt 178 lb 6.4 oz (80.9 kg)   BMI 33.71 kg/m  General:   Alert, pleasant and cooperative in NAD. Accompanied by her husband. Skin:  No jaundice. Eyes:  Sclera clear, no icterus.   Conjunctiva pink. Ears:  Normal auditory acuity. Nose:  No deformity, discharge,  or lesions. Mouth:  No deformity or lesions. Neck:  Supple; no masses or thyromegaly. No significant cervical adenopathy. Lungs:  Clear throughout to auscultation.   No wheezes, crackles, or rhonchi. No acute distress. Heart:  Regular rate and rhythm; no murmurs, clicks, rubs,  or gallops. Abdomen: Central obesity present. Soft and nontender without appreciable mass or organomegaly. Pulses:  Normal pulses noted. Extremities:  Without clubbing or edema.  Impression:  Pleasant 66 year old lady with well compensated  Nash/cirrhosis, GERD, Barrett's esophagus and history of high-grade adenoma. Clinically, doing well. Bile duct nearly tripled in diameter over 6 months. No intrahepatic dilation reported. LFTs ok except for bilirubin  last month. These labs need to be updated. She is devoid of any biliary-type symptoms at this time.  No narcotic therapy.  Recommendations:  We'll go ahead and do an MRI of liver/MRCP utilizing Eovist just get a better look at her liver while examining her bile duct.  We'll do a dedicated hepatic profile next month or hematology labs.  Continue Inderal and Protonix  No need to move the timing of her next colonoscopy. Plan for 2021; can probably plan to surveil Barrett's at that time as well..  Medical record to be updated as far as hepatitis A and B vaccination concern.  Notice: This dictation was prepared with Dragon dictation along with smaller phrase technology. Any transcriptional errors that result from this process are unintentional and may not be corrected upon review.

## 2016-12-01 NOTE — Progress Notes (Signed)
Alisha [name] presents today for injection per MD orders. B12 1,060mg administered IM in right Upper Arm. Administration without incident. Patient tolerated well.  Vitals stable and discharged home from clinic ambulatory. Follow up as scheduled.

## 2016-12-01 NOTE — Patient Instructions (Signed)
No change in medical regimen for now.  Schedule an EOVIST MRI / MRCP - further screening for hepatoma and to evaluate dilated bile duct  Hepatic profile next month with hematology labs  Further recommendations to follow.  Office visit in 3 months

## 2016-12-02 NOTE — Progress Notes (Signed)
ON RECALL  °

## 2016-12-04 ENCOUNTER — Ambulatory Visit (HOSPITAL_COMMUNITY)
Admission: RE | Admit: 2016-12-04 | Discharge: 2016-12-04 | Disposition: A | Discharge status: Home / Self Care | Payer: Medicare Other | Source: Ambulatory Visit | Attending: Internal Medicine | Admitting: Internal Medicine

## 2016-12-04 ENCOUNTER — Other Ambulatory Visit: Payer: Self-pay | Admitting: Internal Medicine

## 2016-12-04 DIAGNOSIS — C220 Liver cell carcinoma: Secondary | ICD-10-CM | POA: Insufficient documentation

## 2016-12-04 DIAGNOSIS — I700 Atherosclerosis of aorta: Secondary | ICD-10-CM | POA: Diagnosis not present

## 2016-12-04 DIAGNOSIS — R161 Splenomegaly, not elsewhere classified: Secondary | ICD-10-CM | POA: Diagnosis not present

## 2016-12-04 DIAGNOSIS — K7460 Unspecified cirrhosis of liver: Secondary | ICD-10-CM | POA: Diagnosis not present

## 2016-12-04 DIAGNOSIS — K869 Disease of pancreas, unspecified: Secondary | ICD-10-CM | POA: Diagnosis not present

## 2016-12-04 DIAGNOSIS — K838 Other specified diseases of biliary tract: Secondary | ICD-10-CM

## 2016-12-04 DIAGNOSIS — I8500 Esophageal varices without bleeding: Secondary | ICD-10-CM | POA: Diagnosis not present

## 2016-12-04 DIAGNOSIS — K8020 Calculus of gallbladder without cholecystitis without obstruction: Secondary | ICD-10-CM | POA: Diagnosis not present

## 2016-12-04 DIAGNOSIS — I868 Varicose veins of other specified sites: Secondary | ICD-10-CM | POA: Diagnosis not present

## 2016-12-04 MED ORDER — GADOXETATE DISODIUM 0.25 MMOL/ML IV SOLN
8.0000 mL | Freq: Once | INTRAVENOUS | Status: AC | PRN
  Administered 2016-12-04: 8 mL via INTRAVENOUS

## 2016-12-08 ENCOUNTER — Other Ambulatory Visit: Payer: Self-pay | Admitting: Internal Medicine

## 2016-12-08 ENCOUNTER — Other Ambulatory Visit: Payer: Self-pay

## 2016-12-08 DIAGNOSIS — K7460 Unspecified cirrhosis of liver: Secondary | ICD-10-CM

## 2016-12-08 NOTE — Progress Notes (Signed)
ON RECALL  °

## 2016-12-08 NOTE — Progress Notes (Signed)
Needs to be done pretty soon.

## 2016-12-19 ENCOUNTER — Emergency Department (HOSPITAL_COMMUNITY): Payer: Medicare Other

## 2016-12-19 ENCOUNTER — Encounter (HOSPITAL_COMMUNITY): Payer: Self-pay | Admitting: Emergency Medicine

## 2016-12-19 ENCOUNTER — Inpatient Hospital Stay (HOSPITAL_COMMUNITY)
Admission: EM | Admit: 2016-12-19 | Discharge: 2016-12-24 | DRG: 391 | Disposition: A | Discharge status: Home / Self Care | Payer: Medicare Other | Attending: Internal Medicine | Admitting: Internal Medicine

## 2016-12-19 DIAGNOSIS — D62 Acute posthemorrhagic anemia: Secondary | ICD-10-CM | POA: Diagnosis present

## 2016-12-19 DIAGNOSIS — Z90710 Acquired absence of both cervix and uterus: Secondary | ICD-10-CM | POA: Diagnosis not present

## 2016-12-19 DIAGNOSIS — D509 Iron deficiency anemia, unspecified: Secondary | ICD-10-CM | POA: Diagnosis present

## 2016-12-19 DIAGNOSIS — I8501 Esophageal varices with bleeding: Secondary | ICD-10-CM | POA: Diagnosis present

## 2016-12-19 DIAGNOSIS — K5732 Diverticulitis of large intestine without perforation or abscess without bleeding: Principal | ICD-10-CM | POA: Diagnosis present

## 2016-12-19 DIAGNOSIS — K921 Melena: Secondary | ICD-10-CM | POA: Diagnosis present

## 2016-12-19 DIAGNOSIS — E1165 Type 2 diabetes mellitus with hyperglycemia: Secondary | ICD-10-CM | POA: Diagnosis present

## 2016-12-19 DIAGNOSIS — E119 Type 2 diabetes mellitus without complications: Secondary | ICD-10-CM

## 2016-12-19 DIAGNOSIS — E860 Dehydration: Secondary | ICD-10-CM | POA: Diagnosis present

## 2016-12-19 DIAGNOSIS — D696 Thrombocytopenia, unspecified: Secondary | ICD-10-CM | POA: Diagnosis present

## 2016-12-19 DIAGNOSIS — K7460 Unspecified cirrhosis of liver: Secondary | ICD-10-CM | POA: Diagnosis present

## 2016-12-19 DIAGNOSIS — I10 Essential (primary) hypertension: Secondary | ICD-10-CM | POA: Diagnosis present

## 2016-12-19 DIAGNOSIS — Z8249 Family history of ischemic heart disease and other diseases of the circulatory system: Secondary | ICD-10-CM

## 2016-12-19 DIAGNOSIS — K3189 Other diseases of stomach and duodenum: Secondary | ICD-10-CM | POA: Diagnosis present

## 2016-12-19 DIAGNOSIS — I8510 Secondary esophageal varices without bleeding: Secondary | ICD-10-CM | POA: Diagnosis present

## 2016-12-19 DIAGNOSIS — Z820 Family history of epilepsy and other diseases of the nervous system: Secondary | ICD-10-CM | POA: Diagnosis not present

## 2016-12-19 DIAGNOSIS — E538 Deficiency of other specified B group vitamins: Secondary | ICD-10-CM | POA: Diagnosis present

## 2016-12-19 DIAGNOSIS — Z7984 Long term (current) use of oral hypoglycemic drugs: Secondary | ICD-10-CM

## 2016-12-19 DIAGNOSIS — Z885 Allergy status to narcotic agent status: Secondary | ICD-10-CM

## 2016-12-19 DIAGNOSIS — K920 Hematemesis: Secondary | ICD-10-CM | POA: Diagnosis present

## 2016-12-19 DIAGNOSIS — K7581 Nonalcoholic steatohepatitis (NASH): Secondary | ICD-10-CM | POA: Diagnosis present

## 2016-12-19 DIAGNOSIS — K2270 Barrett's esophagus without dysplasia: Secondary | ICD-10-CM | POA: Diagnosis present

## 2016-12-19 DIAGNOSIS — Z833 Family history of diabetes mellitus: Secondary | ICD-10-CM | POA: Diagnosis not present

## 2016-12-19 DIAGNOSIS — Z79899 Other long term (current) drug therapy: Secondary | ICD-10-CM

## 2016-12-19 DIAGNOSIS — K5792 Diverticulitis of intestine, part unspecified, without perforation or abscess without bleeding: Secondary | ICD-10-CM | POA: Diagnosis present

## 2016-12-19 DIAGNOSIS — IMO0002 Reserved for concepts with insufficient information to code with codable children: Secondary | ICD-10-CM

## 2016-12-19 DIAGNOSIS — E785 Hyperlipidemia, unspecified: Secondary | ICD-10-CM | POA: Diagnosis present

## 2016-12-19 DIAGNOSIS — K922 Gastrointestinal hemorrhage, unspecified: Secondary | ICD-10-CM | POA: Diagnosis not present

## 2016-12-19 DIAGNOSIS — R1032 Left lower quadrant pain: Secondary | ICD-10-CM | POA: Diagnosis not present

## 2016-12-19 DIAGNOSIS — E871 Hypo-osmolality and hyponatremia: Secondary | ICD-10-CM | POA: Diagnosis present

## 2016-12-19 DIAGNOSIS — K766 Portal hypertension: Secondary | ICD-10-CM | POA: Diagnosis present

## 2016-12-19 DIAGNOSIS — R911 Solitary pulmonary nodule: Secondary | ICD-10-CM | POA: Diagnosis present

## 2016-12-19 LAB — TYPE AND SCREEN
ABO/RH(D): O POS
Antibody Screen: NEGATIVE

## 2016-12-19 LAB — PROTIME-INR
INR: 1.26
PROTHROMBIN TIME: 15.9 s — AB (ref 11.4–15.2)

## 2016-12-19 LAB — CBC
HCT: 36.4 % (ref 36.0–46.0)
HCT: 26.9 % — ABNORMAL LOW (ref 36.0–46.0)
Hemoglobin: 12.7 g/dL (ref 12.0–15.0)
Hemoglobin: 9.4 g/dL — ABNORMAL LOW (ref 12.0–15.0)
MCH: 30.2 pg (ref 26.0–34.0)
MCH: 30.5 pg (ref 26.0–34.0)
MCHC: 34.9 g/dL (ref 30.0–36.0)
MCHC: 34.9 g/dL (ref 30.0–36.0)
MCV: 86.5 fL (ref 78.0–100.0)
MCV: 87.3 fL (ref 78.0–100.0)
PLATELETS: 113 10*3/uL — AB (ref 150–400)
PLATELETS: 64 10*3/uL — AB (ref 150–400)
RBC: 4.21 MIL/uL (ref 3.87–5.11)
RBC: 3.08 MIL/uL — ABNORMAL LOW (ref 3.87–5.11)
RDW: 13.5 % (ref 11.5–15.5)
RDW: 13.6 % (ref 11.5–15.5)
WBC: 10.1 10*3/uL (ref 4.0–10.5)
WBC: 5.4 10*3/uL (ref 4.0–10.5)

## 2016-12-19 LAB — LIPASE, BLOOD: Lipase: 21 U/L (ref 11–51)

## 2016-12-19 LAB — COMPREHENSIVE METABOLIC PANEL
ALBUMIN: 3.6 g/dL (ref 3.5–5.0)
ALK PHOS: 73 U/L (ref 38–126)
ALT: 23 U/L (ref 14–54)
AST: 26 U/L (ref 15–41)
Anion gap: 10 (ref 5–15)
BUN: 13 mg/dL (ref 6–20)
CALCIUM: 9.1 mg/dL (ref 8.9–10.3)
CHLORIDE: 99 mmol/L — AB (ref 101–111)
CO2: 20 mmol/L — AB (ref 22–32)
CREATININE: 0.81 mg/dL (ref 0.44–1.00)
GFR calc Af Amer: >60 mL/min (ref >60)
GFR calc non Af Amer: >60 mL/min (ref >60)
GLUCOSE: 457 mg/dL — AB (ref 65–99)
Potassium: 5 mmol/L (ref 3.5–5.1)
SODIUM: 129 mmol/L — AB (ref 135–145)
Total Bilirubin: 3.7 mg/dL — ABNORMAL HIGH (ref 0.3–1.2)
Total Protein: 6.9 g/dL (ref 6.5–8.1)

## 2016-12-19 LAB — GLUCOSE, CAPILLARY: GLUCOSE-CAPILLARY: 325 mg/dL — AB (ref 65–99)

## 2016-12-19 LAB — CBG MONITORING, ED: GLUCOSE-CAPILLARY: 301 mg/dL — AB (ref 65–99)

## 2016-12-19 MED ORDER — SODIUM CHLORIDE 0.9 % IV BOLUS (SEPSIS)
500.0000 mL | Freq: Once | INTRAVENOUS | Status: AC
  Administered 2016-12-19: 500 mL via INTRAVENOUS

## 2016-12-19 MED ORDER — METRONIDAZOLE 500 MG PO TABS
500.0000 mg | ORAL_TABLET | Freq: Once | ORAL | Status: AC
  Administered 2016-12-19: 500 mg via ORAL
  Filled 2016-12-19: qty 1

## 2016-12-19 MED ORDER — PANTOPRAZOLE SODIUM 40 MG IV SOLR
40.0000 mg | Freq: Two times a day (BID) | INTRAVENOUS | Status: DC
  Administered 2016-12-20 – 2016-12-22 (×4): 40 mg via INTRAVENOUS
  Filled 2016-12-19 (×4): qty 40

## 2016-12-19 MED ORDER — INSULIN ASPART 100 UNIT/ML ~~LOC~~ SOLN
5.0000 [IU] | Freq: Once | SUBCUTANEOUS | Status: AC
  Administered 2016-12-19: 5 [IU] via SUBCUTANEOUS
  Filled 2016-12-19: qty 1

## 2016-12-19 MED ORDER — FAMOTIDINE IN NACL 20-0.9 MG/50ML-% IV SOLN
20.0000 mg | Freq: Once | INTRAVENOUS | Status: AC
Start: 2016-12-19 — End: 2016-12-19
  Administered 2016-12-19: 20 mg via INTRAVENOUS
  Filled 2016-12-19: qty 50

## 2016-12-19 MED ORDER — SODIUM CHLORIDE 0.9 % IV SOLN
INTRAVENOUS | Status: DC
Start: 2016-12-19 — End: 2016-12-19
  Administered 2016-12-19: 17:00:00 via INTRAVENOUS

## 2016-12-19 MED ORDER — MORPHINE SULFATE (PF) 2 MG/ML IV SOLN: 1.0000 mg | INTRAVENOUS | Status: DC | PRN

## 2016-12-19 MED ORDER — METRONIDAZOLE IN NACL 5-0.79 MG/ML-% IV SOLN
500.0000 mg | Freq: Three times a day (TID) | INTRAVENOUS | Status: DC
  Administered 2016-12-20 – 2016-12-24 (×14): 500 mg via INTRAVENOUS
  Filled 2016-12-19 (×14): qty 100

## 2016-12-19 MED ORDER — OCTREOTIDE ACETATE 500 MCG/ML IJ SOLN
INTRAMUSCULAR | Status: AC
  Filled 2016-12-19: qty 1

## 2016-12-19 MED ORDER — DEXTROSE 5 % IV SOLN: 1.0000 g | INTRAVENOUS | Status: DC

## 2016-12-19 MED ORDER — ONDANSETRON HCL 4 MG/2ML IJ SOLN
4.0000 mg | Freq: Once | INTRAMUSCULAR | Status: AC
  Administered 2016-12-19: 4 mg via INTRAVENOUS
  Filled 2016-12-19: qty 2

## 2016-12-19 MED ORDER — PANTOPRAZOLE SODIUM 40 MG IV SOLR
40.0000 mg | Freq: Once | INTRAVENOUS | Status: AC
Start: 2016-12-19 — End: 2016-12-19
  Administered 2016-12-19: 40 mg via INTRAVENOUS
  Filled 2016-12-19: qty 40

## 2016-12-19 MED ORDER — IOPAMIDOL (ISOVUE-300) INJECTION 61%
100.0000 mL | Freq: Once | INTRAVENOUS | Status: AC | PRN
  Administered 2016-12-19: 100 mL via INTRAVENOUS

## 2016-12-19 MED ORDER — DEXTROSE 5 % IV SOLN
2.0000 g | INTRAVENOUS | Status: DC
  Administered 2016-12-20 – 2016-12-23 (×4): 2 g via INTRAVENOUS
  Filled 2016-12-19 (×5): qty 2

## 2016-12-19 MED ORDER — INSULIN ASPART 100 UNIT/ML ~~LOC~~ SOLN
0.0000 [IU] | SUBCUTANEOUS | Status: DC
  Administered 2016-12-19: 11 [IU] via SUBCUTANEOUS
  Administered 2016-12-20 (×3): 5 [IU] via SUBCUTANEOUS
  Administered 2016-12-20: 2 [IU] via SUBCUTANEOUS
  Administered 2016-12-20 – 2016-12-21 (×4): 8 [IU] via SUBCUTANEOUS
  Administered 2016-12-21: 5 [IU] via SUBCUTANEOUS
  Administered 2016-12-21: 8 [IU] via SUBCUTANEOUS

## 2016-12-19 MED ORDER — CIPROFLOXACIN IN D5W 400 MG/200ML IV SOLN
400.0000 mg | Freq: Once | INTRAVENOUS | Status: AC
Start: 2016-12-19 — End: 2016-12-19
  Administered 2016-12-19: 400 mg via INTRAVENOUS
  Filled 2016-12-19: qty 200

## 2016-12-19 MED ORDER — MORPHINE SULFATE (PF) 4 MG/ML IV SOLN
4.0000 mg | Freq: Once | INTRAVENOUS | Status: AC
  Administered 2016-12-19: 4 mg via INTRAVENOUS
  Filled 2016-12-19: qty 1

## 2016-12-19 MED ORDER — ONDANSETRON HCL 4 MG/2ML IJ SOLN
4.0000 mg | Freq: Four times a day (QID) | INTRAMUSCULAR | Status: DC | PRN
  Administered 2016-12-21 – 2016-12-22 (×2): 4 mg via INTRAVENOUS
  Filled 2016-12-19 (×2): qty 2

## 2016-12-19 MED ORDER — ONDANSETRON HCL 4 MG PO TABS: 4.0000 mg | ORAL_TABLET | Freq: Four times a day (QID) | ORAL | Status: DC | PRN

## 2016-12-19 MED ORDER — IOPAMIDOL (ISOVUE-300) INJECTION 61%
15.0000 mL | Freq: Once | INTRAVENOUS | Status: AC | PRN
  Administered 2016-12-19: 15 mL via ORAL

## 2016-12-19 MED ORDER — SODIUM CHLORIDE 0.9 % IV SOLN
INTRAVENOUS | Status: AC
  Administered 2016-12-19: 19:00:00 via INTRAVENOUS

## 2016-12-19 MED ORDER — SODIUM CHLORIDE 0.9 % IV BOLUS (SEPSIS)
1000.0000 mL | Freq: Once | INTRAVENOUS | Status: AC
  Administered 2016-12-19: 1000 mL via INTRAVENOUS

## 2016-12-19 MED ORDER — SODIUM CHLORIDE 0.9 % IV SOLN
50.0000 ug/h | INTRAVENOUS | Status: DC
  Administered 2016-12-19: 25 ug/h via INTRAVENOUS
  Administered 2016-12-20 – 2016-12-22 (×6): 50 ug/h via INTRAVENOUS
  Filled 2016-12-19 (×7): qty 1

## 2016-12-19 NOTE — H&P (Addendum)
History and Physical    Alisha [name] HYQ:[id number] DOB: [birth year] DOA: 12/19/2016  PCP: Glenda Chroman, MD   Patient coming from: Home  Chief Complaint: Abdominal pain, back pain, hematemesis   HPI: Alisha [name] is a 66 y.o. female with medical history significant for nonalcoholic liver cirrhosis with varices, type 2 diabetes mellitus, and chronic anemia who presents to the emergency department for evaluation of lower abdominal pain, lower back pain, and one episode of hematemesis. Patient reports that she had been in her usual state of health until several days ago when she noted the insidious development of achy pain in the left lower quadrant of her abdomen. Pain was described as moderate in intensity, achy in character, localized to the left lower quadrant, waxing and waning, without alleviating or exacerbating factors identified, and similar to her prior experiences with diverticulitis. Over the ensuing days, the pain began to migrate to the bilateral lower back and was accompanied by some nausea. She vomited once on the day of her presentation and describes a large volume of vomitus with some dark blood in it. She denies any chest pain or palpitations, denies dyspnea or cough, and denies any lightheadedness, headache, change in vision or hearing, or focal numbness or weakness. Patient denies any history of hematemesis prior to this and denies any melena or hematochezia. Patient denies any use of NSAIDs or alcohol. She is not anticoagulated.  ED Course: Upon arrival to the ED, patient is found to be afebrile, saturating well on room air, blood pressure 104/52, and normal heart rate or respirations. Chemistry panel is notable for a hyponatremia to 129 and a serum glucose of 457. Total bilirubin is elevated to 3.7, continuing to increase from priors. CBC is notable for a thrombocytopenia with platelet count 213,000, improved from priors. INR is 1.26 . CT of the abdomen and pelvis  demonstrates 2 areas of acute diverticulitis; one in the distal transverse colon and one in the distal descending colon without perforation or complication. CT also reveals findings consistent with cirrhosis and worsening paraesophageal varices. Lung nodule is noted in the right lung base with radiology advising a pulmonary consultation. Type and screen was performed, 1 L of normal saline was given, patient was treated with ciprofloxacin and Flagyl, and symptomatic care was provided with morphine and Zofran. 20 mg IV Pepcid was administered and 40 mg IV Protonix also given. ED physician consulted with gastroenterology and it was advised that the patient be admitted by medicine with octreotide infusion and twice daily IV Protonix. BP remained soft but stable in the ED and the patient has not been been in any respiratory distress. She'll be admitted to the telemetry unit for ongoing evaluation and management of acute diverticulitis with an episode of blood in her vomitus in the setting of cirrhosis with known esophageal varices.   Review of Systems:  All other systems reviewed and apart from HPI, are negative.  Past Medical History:  Diagnosis Date  . B12 deficiency 11/03/2016  . B12 deficiency 11/03/2016  . Barrett's esophagus   . Cirrhosis of liver without mention of alcohol    hep B surface antigen and HCV ab negative.pt has not had hepatitis A and B vaccines.U/S on 07/04/13 shows cirrhosis.  . Diverticula of colon    pancolonic  . Esophageal reflux   . Esophageal varices ([place])   . Hiatal hernia   . Iron deficiency anemia, unspecified   . Other and unspecified hyperlipidemia   . Portal hypertensive gastropathy   .  Tubular adenoma   . Type II or unspecified type diabetes mellitus without mention of complication, not stated as uncontrolled   . Unspecified essential hypertension   . Unspecified hemorrhoids without mention of complication     Past Surgical History:  Procedure Laterality Date    . ABDOMINAL HYSTERECTOMY    . BIOPSY  04/30/2016   Procedure: BIOPSY;  Surgeon: Daneil Dolin, MD;  [facility];  Service: Endoscopy;;  esophagus  . CESAREAN SECTION     x 2  . COLONOSCOPY  03/2006   left sided diverticula, splenic flexure tublar adenoma  . COLONOSCOPY  07/2002   villous tubular adenoma in rectum  . COLONOSCOPY  09/23/09   external hemorrhoids/scattered pan colonic diverticula otherwise normal. cecal lipoma bx negative, normal TI. Next TCS 09/2014  . COLONOSCOPY N/A 11/12/2014   Procedure: COLONOSCOPY;  Surgeon: Daneil Dolin, MD;  [facility]: [facility];  Service: Endoscopy;  Laterality: N/A;  1215 - moved to 12:30 - Ginger to notify pt  . ESOPHAGOGASTRODUODENOSCOPY  08/2006   barretts esophagus, no dyplasia  . ESOPHAGOGASTRODUODENOSCOPY  03/2006   barretts esophagus,bx focal atypia c/w low grade dysplasia, SB bx negative for celiac  . ESOPHAGOGASTRODUODENOSCOPY  09/23/09   3 columns of grade 2 esophageal varices/hiatal hernia/3-4 cm segment Barrett's esophagus without dysplasia. Next EGD 09/2012  . ESOPHAGOGASTRODUODENOSCOPY N/A 11/11/2012   Dr. Gala Romney- Barrett;s esophagus, esophageal varices, portal gastopathy. hiatal hernia  . ESOPHAGOGASTRODUODENOSCOPY N/A 04/30/2016   Procedure: ESOPHAGOGASTRODUODENOSCOPY (EGD);  Surgeon: Daneil Dolin, MD;  [facility];  Service: Endoscopy;  Laterality: N/A;  815  . [place]  2008  . small bowel capsule endoscopy  10/2009   normal     reports that she has never smoked. She has never used smokeless tobacco. She reports that she does not drink alcohol or use drugs.  Allergies  Allergen Reactions  . Codeine Nausea And Vomiting    Family History  Problem Relation Age of Onset  . Alzheimer's disease Mother   . CAD Father   . Diabetes Mellitus II Father   . Alzheimer's disease Father   . Diabetes Mellitus II Sister   . Cancer - Other Brother   . Diabetes Mellitus II Brother   . Hypertension  Brother      Prior to Admission medications   Medication Sig Start Date End Date Taking? Authorizing Provider  cyanocobalamin (,VITAMIN B-12,) 1000 MCG/ML injection Inject 1,000 mcg into the muscle every 30 (thirty) days.   Yes Historical Provider, MD  GLIPIZIDE XL 10 MG 24 hr tablet Take 10 mg by mouth daily.  02/24/12  Yes Historical Provider, MD  lisinopril (PRINIVIL,ZESTRIL) 10 MG tablet Take 10 mg by mouth daily.  04/09/12  Yes Historical Provider, MD  metFORMIN (GLUCOPHAGE) 1000 MG tablet Take 1,000 mg by mouth 2 (two) times daily with a meal.  02/09/12  Yes Historical Provider, MD  NEXIUM 40 MG capsule Take 40 mg by mouth daily before breakfast.  04/14/12  Yes Historical Provider, MD  propranolol (INDERAL) 10 MG tablet Take 3 tablets in the morning and 2 in the evening 05/22/16  Yes Annitta Needs, NP  VICTOZA 18 MG/3ML SOLN Inject 1.8 mg into the skin daily.  04/15/12  Yes Historical Provider, MD    Physical Exam: Vitals:   12/19/16 1412 12/19/16 1443 12/19/16 1549 12/19/16 1600  BP:  124/66 (!) 118/52 (!) 104/52  Pulse: 93  91 90  Resp: (!) 21  20 (!)  22  Temp:      TempSrc:      SpO2: 97%  95% 96%  Weight:      Height:          Constitutional: NAD, calm. Jaundiced.  Eyes: PERTLA, lids and conjunctivae normal ENMT: Mucous membranes are dry. Posterior pharynx clear of any exudate or lesions.   Neck: normal, supple, no masses, no thyromegaly Respiratory: clear to auscultation bilaterally, no wheezing, no crackles. Normal respiratory effort.   Cardiovascular: S1 & S2 heard, regular rate and rhythm. No extremity edema. No significant JVD. Abdomen: No distension, soft, no masses palpated. Tender in LLQ without rebound pain or guarding. Bowel sounds appreciated.  Musculoskeletal: no clubbing / cyanosis. No joint deformity upper and lower extremities.   Skin: no significant rashes, lesions, ulcers. Warm, dry, well-perfused. Jaundiced, poor turgor.  Neurologic: CN 2-12 grossly intact.  Sensation intact, DTR normal. Strength 5/5 in all 4 limbs.  Psychiatric: Alert and oriented x 3. Normal mood and affect.     Labs on Admission: I have personally reviewed following labs and imaging studies  CBC:  Recent Labs Lab 12/19/16 1307  WBC 10.1  HGB 12.7  HCT 36.4  MCV 86.5  PLT 680*   Basic Metabolic Panel:  Recent Labs Lab 12/19/16 1307  NA 129*  K 5.0  CL 99*  CO2 20*  GLUCOSE 457*  BUN 13  CREATININE 0.81  CALCIUM 9.1   GFR: Estimated Creatinine Clearance: 65.8 mL/min (by C-G formula based on SCr of 0.81 mg/dL). Liver Function Tests:  Recent Labs Lab 12/19/16 1307  AST 26  ALT 23  ALKPHOS 73  BILITOT 3.7*  PROT 6.9  ALBUMIN 3.6    Recent Labs Lab 12/19/16 1307  LIPASE 21   No results for input(s): AMMONIA in the last 168 hours. Coagulation Profile:  Recent Labs Lab 12/19/16 1307  INR 1.26   Cardiac Enzymes: No results for input(s): CKTOTAL, CKMB, CKMBINDEX, TROPONINI in the last 168 hours. BNP (last 3 results) No results for input(s): PROBNP in the last 8760 hours. HbA1C: No results for input(s): HGBA1C in the last 72 hours. CBG:  Recent Labs Lab 12/19/16 1540  GLUCAP 301*   Lipid Profile: No results for input(s): CHOL, HDL, LDLCALC, TRIG, CHOLHDL, LDLDIRECT in the last 72 hours. Thyroid Function Tests: No results for input(s): TSH, T4TOTAL, FREET4, T3FREE, THYROIDAB in the last 72 hours. Anemia Panel: No results for input(s): VITAMINB12, FOLATE, FERRITIN, TIBC, IRON, RETICCTPCT in the last 72 hours. Urine analysis: No results found for: COLORURINE, APPEARANCEUR, LABSPEC, PHURINE, GLUCOSEU, HGBUR, BILIRUBINUR, KETONESUR, PROTEINUR, UROBILINOGEN, NITRITE, LEUKOCYTESUR Sepsis Labs: @LABRCNTIP (procalcitonin:4,lacticidven:4) )No results found for this or any previous visit (from the past 240 hour(s)).   Radiological Exams on Admission: Ct Abdomen Pelvis W Contrast  Result Date: 12/19/2016 CLINICAL DATA:  Left lower  quadrant pain for 1 week. EXAM: CT ABDOMEN AND PELVIS WITH CONTRAST TECHNIQUE: Multidetector CT imaging of the abdomen and pelvis was performed using the standard protocol following bolus administration of intravenous contrast. CONTRAST:  134m ISOVUE-300 IOPAMIDOL (ISOVUE-300) INJECTION 61%, 150mISOVUE-300 IOPAMIDOL (ISOVUE-300) INJECTION 61% COMPARISON:  MRI of the abdomen December 04, 2016, CT the abdomen and pelvis September 27, 2009 and PET-CT October 11, 2009 FINDINGS: Lower chest: There is a somewhat nodular opacity with air bronchograms in the right middle lobe. This was evaluated with PET-CT in 2011 and was not FDG avid. However, it is more substantial in appearance today. The most substantial component measures 1.5 by 2.4 cm today  versus 0.6 x 2.2 cm in 2011. No new pulmonary nodules or masses. 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 Contrast is seen in the distal esophagus consistent with reflux. The lung bases are otherwise normal. Hepatobiliary: The liver demonstrates a nodular contour, particularly in the left hepatic lobe consistent with cirrhosis. The 8 mm lesion seen in segment 4 a is seen on series 2, image 13 today, better assessed on the recent MRI. It have not obviously grown in the interval. No other liver masses are seen. Hepatic steatosis is noted. The portal vein remains patent. There is re- cannulization of the umbilical vein. Cholelithiasis is seen without wall thickening or pericholecystic fluid. No intra or extrahepatic biliary duct dilatation. Pancreas: The cystic lesions in the pancreas described on the recent MRI are better assessed on that study. The largest in the pancreatic neck is similar since 2011. No suspicious solid masses identified. Spleen: The spleen demonstrates heterogeneous attenuation. No focal masses are seen. The spleen measures 15 cm in cranial caudal dimension which is stable. Adrenals/Urinary Tract: The adrenal glands are normal.  The kidneys, ureters, and bladder are unremarkable as well. Stomach/Bowel: The stomach and small bowel are normal. Colonic diverticulosis is identified. There is inflammation around a colonic diverticulum in the distal descending colon best seen on coronal image 53 consistent with diverticulitis. No evidence of perforation or complication. There is also a mildly inflamed diverticulum in the distal transverse colon without perforation or complication. Remainder of the colon is unremarkable. The appendix is normal. Vascular/Lymphatic: Atherosclerotic changes are seen in the non aneurysmal aorta. Worsening varices in the gastrohepatic ligament, anterior left abdominal wall on image 55, and paraesophageal region. Re- cannulization of the umbilical vein. Collateral vessels in the abdominal wall. No suspicious adenopathy. Reproductive: Status post hysterectomy. No adnexal masses. Other: No abdominal wall hernia or abnormality. No abdominopelvic ascites. Musculoskeletal: No acute or significant osseous findings. IMPRESSION: 1. Two sites of diverticulitis involving the distal transverse colon and the distal descending colon without perforation or complication. This likely explains the patient's symptoms. 2. An irregular nodular opacity in the right lung base was thought to be scarring in 2011. It is more nodular and substantial in appearance today. Despite the negative PET-CT in 2011, further evaluation is recommended to exclude a slow growing low-grade neoplasm. Further evaluation could be performed with a follow-up PET-CT or tissue diagnosis. Consider pulmonary consultation. 3. Cirrhosis. Worsening varices including in the paraesophageal region suggesting worsening portal venous hypertension. 4. The known liver lesion in segment 4A was better assessed on recent MRI. 5. Known pancreatic cystic lesions were better assessed on recent MRI. 6. Atherosclerosis. Electronically Signed   By: Dorise Bullion III M.D   On:  12/19/2016 16:03    EKG: Not performed, will obtain as appropriate.   Assessment/Plan   1. Acute diverticulitis  - Pt presents with LLQ px similar to prior experiences with diverticulitis; CT findings consistent with diverticulitis in distal transverse and distal descending colon with complication  - She is non-toxic in appearance, afebrile, and without leukocytosis  - Empiric abx started in ED; continue Rocephin and Flagyl  - Continue supportive care with IVF, prn analgesia and anti-emetics   2. Hematemesis  - Pt reports one episode of emesis with dark blood in it on day of admission - She has known esophageal varices followed by GI, denies hx of UGIB  - Initial H&H are wnl, there is no tachycardia or pallor, and no bleeding noted in ED  -  GI is consulting and much appreciated  - Type and screen has been performed  - Continue IV Protonix 40 mg q12h, start octreotide infusion, monitor for bleeding, repeat CBC in am   3. Liver cirrhosis  - Pt with non-alcoholic liver cirrhosis; no apparent ascites  - Beta-blocker held on admission in light of soft BP, will resume as condition allows  - PPI converted to IV as above   4. Type II DM  - No A1c on file; serum glucose 457 on admission and 5 units sq Novolog given in ED  - Treated at home with glipizide, metformin, and Victoza; these are currently held  - Check CBG q4h; start a moderate-intensity Novolog correctional    5. Hyponatremia - Likely secondary to liver cirrhosis and dehydration  - She was given 1 liter NS in ED and is continued on NS infusion  - Repeat chem panel in am   6. Thrombocytopenia  - Platelets 113k on admission, better than recent priors  - She is being monitored for bleeding as above and type & screen has been performed   - Repeat CBC in am    7. Lung nodule  - At right basilar lung nodule is noted on the admission CT  - Radiology advises follow-up imaging or tissue sampling     DVT prophylaxis: SCD's    Code Status: Full  Family Communication: Husband and daughter updated at bedside Disposition Plan: Admit to telemetry Consults called: Gastroenterology Admission status: Inpatient    Vianne Bulls, MD [facility] Pager [phone]  If 7PM-7AM, please contact night-coverage [email or web address] Password [facility]  12/19/2016, 4:47 PM

## 2016-12-19 NOTE — Consult Note (Signed)
Referring Provider: No ref. provider found Primary Care Physician:  Glenda Chroman, MD Primary Gastroenterologist:  DR. Gala Romney  Reason for Consultation:  HEMATEMESIS  Impression: ADMITTED WITH HEMATEMESIS IN SETTING OF LOW PLTS/CIRRHOSIS. DIFFERENTIAL DIAGNOSIS INCLUDES: MALLORY WEISS TEAR, VARICEAL BLEED, PORTAL HYPERTENSIVE GASTROPATHY, LESS LIKELY PUD, DIEULAFOY'S LESION, OR AVM. LAST EGD AUG 2017: GRADE II ESOPHAGEAL VARICES, CHID PUGH A(6), MELD 12-COMPENSATED LIVER DISEASE. NOW ON ADMISSION APR 2018: MELD SCORE-21, CHILD PUGH B(7)-PARTIALLY DECOMPENSATED LIVER DISEASE. Hb stable in ED. NOW WITH ACUTE UNCOMPLICATED DIVERTICULITIS.  Plan: 1. OCTREOTIDE GTT. CBC Q8H. 2. PROTONIX 40 MG IV Q12H 3. CLEAR LIQUID DIET. NPO AFTER MN EXCEPT MEDS 4. CONSIDER EGD IN AM IF MEDICALLY STABLE. DISCUSSED PROCEDURE, BENEFITS, & RISKS: < 1% chance of medication reaction, bleeding, OR perforation. 5.  ZOFRAN QAC/HS 6. ADD ABX TO COVER DIVERTICULITIS. I PERSONALLY REVIEWED THE CT WITH DR. Jimmye Norman.  7. CONSIDER TCS AS OUTPATIENT DUE TO ATYPICAL PRESENTATION OF DIVERTICULITIS.  1750: DISCUSSED WITH DR. Myna Hidalgo, PT, AND FAMILY.  HPI:  PT WAS IN HER USUAL STATE OF HEALTH AND DEVELOPED ONSET OF LLQ PAIN AND NAUSEA ON TUES. PAIN IN LLQ: STABBING/CRAMPY AND GETTING WORSE. HAD ONE EPISODE OF BLOODY VOMIT x1. APPETITE: POOR.   PT DENIES FEVER, CHILLS, HEMATOCHEZIA, nausea, vomiting, melena, diarrhea, CHEST PAIN, SHORTNESS OF BREATH,  CHANGE IN BOWEL IN HABITS, constipation, problems swallowing, problems with sedation, heartburn or indigestion.   Past Medical History:  Diagnosis Date  . B12 deficiency 11/03/2016  . B12 deficiency 11/03/2016  . Barrett's esophagus   . Cirrhosis of liver without mention of alcohol    hep B surface antigen and HCV ab negative.pt has not had hepatitis A and B vaccines.U/S on 07/04/13 shows cirrhosis.  . Diverticula of colon    pancolonic  . Esophageal reflux   . Esophageal varices  ([place])   . Hiatal hernia   . Iron deficiency anemia, unspecified   . Other and unspecified hyperlipidemia   . Portal hypertensive gastropathy   . Tubular adenoma   . Type II or unspecified type diabetes mellitus without mention of complication, not stated as uncontrolled   . Unspecified essential hypertension   . Unspecified hemorrhoids without mention of complication     Past Surgical History:  Procedure Laterality Date  . ABDOMINAL HYSTERECTOMY    . BIOPSY  04/30/2016   Procedure: BIOPSY;  Surgeon: Daneil Dolin, MD;  [facility];  Service: Endoscopy;;  esophagus  . CESAREAN SECTION     x 2  . COLONOSCOPY  03/2006   left sided diverticula, splenic flexure tublar adenoma  . COLONOSCOPY  07/2002   villous tubular adenoma in rectum  . COLONOSCOPY  09/23/09   external hemorrhoids/scattered pan colonic diverticula otherwise normal. cecal lipoma bx negative, normal TI. Next TCS 09/2014  . COLONOSCOPY N/A 11/12/2014   Procedure: COLONOSCOPY;  Surgeon: Daneil Dolin, MD;  [facility];  Service: Endoscopy;  Laterality: N/A;  1215 - moved to 12:30 - Ginger to notify pt  . ESOPHAGOGASTRODUODENOSCOPY  08/2006   barretts esophagus, no dyplasia  . ESOPHAGOGASTRODUODENOSCOPY  03/2006   barretts esophagus,bx focal atypia c/w low grade dysplasia, SB bx negative for celiac  . ESOPHAGOGASTRODUODENOSCOPY  09/23/09   3 columns of grade 2 esophageal varices/hiatal hernia/3-4 cm segment Barrett's esophagus without dysplasia. Next EGD 09/2012  . ESOPHAGOGASTRODUODENOSCOPY N/A 11/11/2012   Dr. Gala Romney- Barrett;s esophagus, esophageal varices, portal gastopathy. hiatal hernia  . ESOPHAGOGASTRODUODENOSCOPY N/A 04/30/2016   Procedure: ESOPHAGOGASTRODUODENOSCOPY (EGD);  Surgeon: Daneil Dolin, MD;  [facility];  Service: Endoscopy;  Laterality: N/A;  815  . [place]  2008  . small bowel capsule endoscopy  10/2009   normal    Prior to Admission medications    Medication Sig Start Date End Date Taking? Authorizing Provider  cyanocobalamin (,VITAMIN B-12,) 1000 MCG/ML injection Inject 1,000 mcg into the muscle every 30 (thirty) days.   Yes Historical Provider, MD  GLIPIZIDE XL 10 MG 24 hr tablet Take 10 mg by mouth daily.  02/24/12  Yes Historical Provider, MD  lisinopril (PRINIVIL,ZESTRIL) 10 MG tablet Take 10 mg by mouth daily.  04/09/12  Yes Historical Provider, MD  metFORMIN (GLUCOPHAGE) 1000 MG tablet Take 1,000 mg by mouth 2 (two) times daily with a meal.  02/09/12  Yes Historical Provider, MD  NEXIUM 40 MG capsule Take 40 mg by mouth daily before breakfast.  04/14/12  Yes Historical Provider, MD  propranolol (INDERAL) 10 MG tablet Take 3 tablets in the morning and 2 in the evening 05/22/16  Yes Annitta Needs, NP  VICTOZA 18 MG/3ML SOLN Inject 1.8 mg into the skin daily.  04/15/12  Yes Historical Provider, MD   Allergies as of 12/19/2016 - Review Complete 12/19/2016  Allergen Reaction Noted  . Codeine Nausea And Vomiting 04/19/2012   Family History  Problem Relation Age of Onset  . Alzheimer's disease Mother   . CAD Father   . Diabetes Mellitus II Father   . Alzheimer's disease Father   . Diabetes Mellitus II Sister   . Cancer - Other Brother   . Diabetes Mellitus II Brother   . Hypertension Brother    Social History   Social History  . Marital status: Married    Spouse name: N/A  . Number of children: N/A  . Years of education: N/A   Occupational History  . Not on file.   Social History Main Topics  . Smoking status: Never Smoker  . Smokeless tobacco: Never Used  . Alcohol use No  . Drug use: No  . Sexual activity: Yes   Other Topics Concern  . Not on file   Social History Narrative  . No narrative on file    Review of Systems: PER HPI OTHERWISE ALL SYSTEMS ARE NEGATIVE. LAST TCS 2013:  DIVERTICULOSIS.   Vitals: Blood pressure 124/66, pulse 93, temperature 99.1 F (37.3 C), temperature source Oral, resp. rate (!) 21,  height 5' 1"  (1.549 m), weight 178 lb (80.7 kg), SpO2 97 %.  Physical Exam: General:   Alert,  Well-developed, well-nourished, pleasant and cooperative in NAD Head:  Normocephalic and atraumatic. Eyes:  Sclera clear, icterus.   Conjunctiva pink. Mouth:  No lesions, dentition ABnormal. Neck:  Supple; no masses. Lungs:  Clear throughout to auscultation.   No wheezes. No acute distress. Heart:  Regular rate and rhythm; no murmurs. Abdomen:  Soft, MILDLY tender IN LLQ, NO REBOUND OR GUARDING, nondistended. No masses noted. Normal bowel sounds, without guarding, and without rebound.   Msk:  Symmetrical without gross deformities. Normal posture. Extremities:  Without edema. Neurologic:  Alert and  oriented x4;  grossly normal neurologically. Cervical Nodes:  No significant cervical adenopathy. Psych:  Alert and cooperative. Normal mood and affect.   Lab Results:  Recent Labs  12/19/16 1307  WBC 10.1  HGB 12.7  HCT 36.4  PLT 113*   BMET  Recent Labs  12/19/16 1307  NA 129*  K 5.0  CL 99*  CO2 20*  GLUCOSE 457*  BUN 13  CREATININE 0.81  CALCIUM 9.1   LFT  Recent Labs  12/19/16 1307  PROT 6.9  ALBUMIN 3.6  AST 26  ALT 23  ALKPHOS 73  BILITOT 3.7*   Studies/Results: MAR 2018: MRCP-[place]. APR 2018: CT APR 7-DIVERTICULITIS IN DISTAL TC/DESCENDING COLON   LOS: 0 days   Wyonia Fontanella  12/19/2016, 3:47 PM

## 2016-12-19 NOTE — Progress Notes (Addendum)
In to see patient at this time and patient noted to be hypotensive. Current BP 85/42 with trending BP's in the low 80's with maps in the mid 50's.  Patient NSR with a HR ranging from 60-70's with 97 02sat on RA.  MD notified of BP at this time, new orders obtained. Will continue to monitor the patient closely.

## 2016-12-19 NOTE — ED Triage Notes (Signed)
Pt reports abd pain and back pain for the past few days.  States this morning she vomited x1 and it was "purple red".

## 2016-12-19 NOTE — ED Provider Notes (Signed)
[facility] DEPT Provider Note   CSN: [id number] Arrival date & time: 12/19/16  1225     History   Chief Complaint Chief Complaint  Patient presents with  . Hematemesis    HPI Alisha [name] is a 66 y.o. female.  HPI  66 year old female with a history of nonalcoholic steatohepatitis, cirrhosis, B12 deficiency and chronic anemia. She presents to the hospital today with a complaint of vomiting blood. She reports that yesterday she started to have some lower abdominal tenderness especially on the left side, today the pain was more in the lower back, she then developed nausea and vomited a large amount of vomit including a large amount of dark red blood. The patient states this only happened once, there was no associated fevers coughing chest pain or shortness of breath. At this time the patient does not have any pain or nausea or vomiting. She does not take any blood thinners, she last had an endoscopy in 2017, per the notes from gastroenterology she has had scattered pan colonic diverticula, grade 2 esophageal varices and a hiatal hernia and 3-4 cm of Barrett's esophagus without dysplasia.  Past Medical History:  Diagnosis Date  . B12 deficiency 11/03/2016  . B12 deficiency 11/03/2016  . Barrett's esophagus   . Cirrhosis of liver without mention of alcohol    hep B surface antigen and HCV ab negative.pt has not had hepatitis A and B vaccines.U/S on 07/04/13 shows cirrhosis.  . Diverticula of colon    pancolonic  . Esophageal reflux   . Esophageal varices ([place])   . Hiatal hernia   . Iron deficiency anemia, unspecified   . Other and unspecified hyperlipidemia   . Portal hypertensive gastropathy   . Tubular adenoma   . Type II or unspecified type diabetes mellitus without mention of complication, not stated as uncontrolled   . Unspecified essential hypertension   . Unspecified hemorrhoids without mention of complication     Patient Active Problem List   Diagnosis Date  Noted  . Hematemesis 12/19/2016  . Acute diverticulitis 12/19/2016  . Hyponatremia 12/19/2016  . Thrombocytopenia ([place]) 12/19/2016  . Diverticulitis large intestine 12/19/2016  . B12 deficiency 11/03/2016  . Esophageal varices ([place]) 04/08/2016  . History of colonic polyps   . Diverticulosis of colon without hemorrhage   . NASH (nonalcoholic steatohepatitis) 12/29/2012  . Hepatic cirrhosis ([place]) 10/23/2009  . Diabetes mellitus type II, non insulin dependent ([place]) 08/30/2009  . HYPERLIPIDEMIA 08/30/2009  . Iron deficiency anemia 08/30/2009  . HYPERTENSION 08/30/2009  . HEMORRHOIDS 08/30/2009  . GERD 08/30/2009  . BARRETTS ESOPHAGUS 08/30/2009    Past Surgical History:  Procedure Laterality Date  . ABDOMINAL HYSTERECTOMY    . BIOPSY  04/30/2016   Procedure: BIOPSY;  Surgeon: Daneil Dolin, MD;  Location: [facility];  Service: Endoscopy;;  esophagus  . CESAREAN SECTION     x 2  . COLONOSCOPY  03/2006   left sided diverticula, splenic flexure tublar adenoma  . COLONOSCOPY  07/2002   villous tubular adenoma in rectum  . COLONOSCOPY  09/23/09   external hemorrhoids/scattered pan colonic diverticula otherwise normal. cecal lipoma bx negative, normal TI. Next TCS 09/2014  . COLONOSCOPY N/A 11/12/2014   Procedure: COLONOSCOPY;  Surgeon: Daneil Dolin, MD;  Location: [facility];  Service: Endoscopy;  Laterality: N/A;  1215 - moved to 12:30 - Ginger to notify pt  . ESOPHAGOGASTRODUODENOSCOPY  08/2006   barretts esophagus, no dyplasia  . ESOPHAGOGASTRODUODENOSCOPY  03/2006   barretts  esophagus,bx focal atypia c/w low grade dysplasia, SB bx negative for celiac  . ESOPHAGOGASTRODUODENOSCOPY  09/23/09   3 columns of grade 2 esophageal varices/hiatal hernia/3-4 cm segment Barrett's esophagus without dysplasia. Next EGD 09/2012  . ESOPHAGOGASTRODUODENOSCOPY N/A 11/11/2012   Dr. Gala Romney- Barrett;s esophagus, esophageal varices, portal gastopathy. hiatal hernia  . ESOPHAGOGASTRODUODENOSCOPY N/A  04/30/2016   Procedure: ESOPHAGOGASTRODUODENOSCOPY (EGD);  Surgeon: Daneil Dolin, MD;  Location: [facility];  Service: Endoscopy;  Laterality: N/A;  815  . [place]  2008  . small bowel capsule endoscopy  10/2009   normal    OB History    No data available       Home Medications    Prior to Admission medications   Medication Sig Start Date End Date Taking? Authorizing Provider  cyanocobalamin (,VITAMIN B-12,) 1000 MCG/ML injection Inject 1,000 mcg into the muscle every 30 (thirty) days.   Yes Historical Provider, MD  GLIPIZIDE XL 10 MG 24 hr tablet Take 10 mg by mouth daily.  02/24/12  Yes Historical Provider, MD  lisinopril (PRINIVIL,ZESTRIL) 10 MG tablet Take 10 mg by mouth daily.  04/09/12  Yes Historical Provider, MD  metFORMIN (GLUCOPHAGE) 1000 MG tablet Take 1,000 mg by mouth 2 (two) times daily with a meal.  02/09/12  Yes Historical Provider, MD  NEXIUM 40 MG capsule Take 40 mg by mouth daily before breakfast.  04/14/12  Yes Historical Provider, MD  propranolol (INDERAL) 10 MG tablet Take 3 tablets in the morning and 2 in the evening 05/22/16  Yes Annitta Needs, NP  VICTOZA 18 MG/3ML SOLN Inject 1.8 mg into the skin daily.  04/15/12  Yes Historical Provider, MD    Family History Family History  Problem Relation Age of Onset  . Alzheimer's disease Mother   . CAD Father   . Diabetes Mellitus II Father   . Alzheimer's disease Father   . Diabetes Mellitus II Sister   . Cancer - Other Brother   . Diabetes Mellitus II Brother   . Hypertension Brother     Social History Social History  Substance Use Topics  . Smoking status: Never Smoker  . Smokeless tobacco: Never Used  . Alcohol use No     Allergies   Codeine   Review of Systems Review of Systems  All other systems reviewed and are negative.    Physical Exam Updated Vital Signs BP (!) 104/52   Pulse 90   Temp 99.1 F (37.3 C) (Oral)   Resp (!) 22   Ht 5' 1"  (1.549 m)   Wt 178 lb (80.7 kg)   SpO2  96%   BMI 33.63 kg/m   Physical Exam  Constitutional: She appears well-developed and well-nourished. No distress.  HENT:  Head: Normocephalic and atraumatic.  Mouth/Throat: Oropharynx is clear and moist. No oropharyngeal exudate.  Eyes: Conjunctivae and EOM are normal. Pupils are equal, round, and reactive to light. Right eye exhibits no discharge. Left eye exhibits no discharge. No scleral icterus.  Neck: Normal range of motion. Neck supple. No JVD present. No thyromegaly present.  Cardiovascular: Normal rate, regular rhythm, normal heart sounds and intact distal pulses.  Exam reveals no gallop and no friction rub.   No murmur heard. Pulmonary/Chest: Effort normal and breath sounds normal. No respiratory distress. She has no wheezes. She has no rales.  Abdominal: Soft. Bowel sounds are normal. She exhibits no distension and no mass. There is no tenderness.  Very soft and nontender abdomen, no guarding, or masses,  no fluid wave, no distension, very soft  Musculoskeletal: Normal range of motion. She exhibits no edema or tenderness.  Lymphadenopathy:    She has no cervical adenopathy.  Neurological: She is alert. Coordination normal.  Skin: Skin is warm and dry. No rash noted. No erythema.  Psychiatric: She has a normal mood and affect. Her behavior is normal.  Nursing note and vitals reviewed.    ED Treatments / Results  Labs (all labs ordered are listed, but only abnormal results are displayed) Labs Reviewed  COMPREHENSIVE METABOLIC PANEL - Abnormal; Notable for the following:       Result Value   Sodium 129 (*)    Chloride 99 (*)    CO2 20 (*)    Glucose, Bld 457 (*)    Total Bilirubin 3.7 (*)    All other components within normal limits  CBC - Abnormal; Notable for the following:    Platelets 113 (*)    All other components within normal limits  PROTIME-INR - Abnormal; Notable for the following:    Prothrombin Time 15.9 (*)    All other components within normal limits    CBG MONITORING, ED - Abnormal; Notable for the following:    Glucose-Capillary 301 (*)    All other components within normal limits  LIPASE, BLOOD  URINALYSIS, ROUTINE W REFLEX MICROSCOPIC  POC OCCULT BLOOD, ED  TYPE AND SCREEN    Radiology Ct Abdomen Pelvis W Contrast  Result Date: 12/19/2016 CLINICAL DATA:  Left lower quadrant pain for 1 week. EXAM: CT ABDOMEN AND PELVIS WITH CONTRAST TECHNIQUE: Multidetector CT imaging of the abdomen and pelvis was performed using the standard protocol following bolus administration of intravenous contrast. CONTRAST:  190m ISOVUE-300 IOPAMIDOL (ISOVUE-300) INJECTION 61%, 141mISOVUE-300 IOPAMIDOL (ISOVUE-300) INJECTION 61% COMPARISON:  MRI of the abdomen December 04, 2016, CT the abdomen and pelvis September 27, 2009 and PET-CT October 11, 2009 FINDINGS: Lower chest: There is a somewhat nodular opacity with air bronchograms in the right middle lobe. This was evaluated with PET-CT in 2011 and was not FDG avid. However, it is more substantial in appearance today. The most substantial component measures 1.5 by 2.4 cm today versus 0.6 x 2.2 cm in 2011. No new pulmonary nodules or masses. 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 Contrast is seen in the distal esophagus consistent with reflux. The lung bases are otherwise normal. Hepatobiliary: The liver demonstrates a nodular contour, particularly in the left hepatic lobe consistent with cirrhosis. The 8 mm lesion seen in segment 4 a is seen on series 2, image 13 today, better assessed on the recent MRI. It have not obviously grown in the interval. No other liver masses are seen. Hepatic steatosis is noted. The portal vein remains patent. There is re- cannulization of the umbilical vein. Cholelithiasis is seen without wall thickening or pericholecystic fluid. No intra or extrahepatic biliary duct dilatation. Pancreas: The cystic lesions in the pancreas described on the recent MRI are  better assessed on that study. The largest in the pancreatic neck is similar since 2011. No suspicious solid masses identified. Spleen: The spleen demonstrates heterogeneous attenuation. No focal masses are seen. The spleen measures 15 cm in cranial caudal dimension which is stable. Adrenals/Urinary Tract: The adrenal glands are normal. The kidneys, ureters, and bladder are unremarkable as well. Stomach/Bowel: The stomach and small bowel are normal. Colonic diverticulosis is identified. There is inflammation around a colonic diverticulum in the distal descending colon best seen on coronal  image 53 consistent with diverticulitis. No evidence of perforation or complication. There is also a mildly inflamed diverticulum in the distal transverse colon without perforation or complication. Remainder of the colon is unremarkable. The appendix is normal. Vascular/Lymphatic: Atherosclerotic changes are seen in the non aneurysmal aorta. Worsening varices in the gastrohepatic ligament, anterior left abdominal wall on image 55, and paraesophageal region. Re- cannulization of the umbilical vein. Collateral vessels in the abdominal wall. No suspicious adenopathy. Reproductive: Status post hysterectomy. No adnexal masses. Other: No abdominal wall hernia or abnormality. No abdominopelvic ascites. Musculoskeletal: No acute or significant osseous findings. IMPRESSION: 1. Two sites of diverticulitis involving the distal transverse colon and the distal descending colon without perforation or complication. This likely explains the patient's symptoms. 2. An irregular nodular opacity in the right lung base was thought to be scarring in 2011. It is more nodular and substantial in appearance today. Despite the negative PET-CT in 2011, further evaluation is recommended to exclude a slow growing low-grade neoplasm. Further evaluation could be performed with a follow-up PET-CT or tissue diagnosis. Consider pulmonary consultation. 3. Cirrhosis.  Worsening varices including in the paraesophageal region suggesting worsening portal venous hypertension. 4. The known liver lesion in segment 4A was better assessed on recent MRI. 5. Known pancreatic cystic lesions were better assessed on recent MRI. 6. Atherosclerosis. Electronically Signed   By: Dorise Bullion III M.D   On: 12/19/2016 16:03    Procedures Procedures (including critical care time)  Medications Ordered in ED Medications  ciprofloxacin (CIPRO) IVPB 400 mg (400 mg Intravenous New Bag/Given 12/19/16 1629)  0.9 %  sodium chloride infusion (not administered)  ondansetron (ZOFRAN) injection 4 mg (4 mg Intravenous Given 12/19/16 1436)  sodium chloride 0.9 % bolus 1,000 mL (0 mLs Intravenous Stopped 12/19/16 1541)  insulin aspart (novoLOG) injection 5 Units (5 Units Subcutaneous Given 12/19/16 1438)  iopamidol (ISOVUE-300) 61 % injection 15 mL (15 mLs Oral Contrast Given 12/19/16 1502)  iopamidol (ISOVUE-300) 61 % injection 100 mL (100 mLs Intravenous Contrast Given 12/19/16 1502)  pantoprazole (PROTONIX) injection 40 mg (40 mg Intravenous Given 12/19/16 1541)  famotidine (PEPCID) IVPB 20 mg premix (20 mg Intravenous New Bag/Given 12/19/16 1541)  morphine 4 MG/ML injection 4 mg (4 mg Intravenous Given 12/19/16 1541)  metroNIDAZOLE (FLAGYL) tablet 500 mg (500 mg Oral Given 12/19/16 1630)     Initial Impression / Assessment and Plan / ED Course  I have reviewed the triage vital signs and the nursing notes.  Pertinent labs & imaging results that were available during my care of the patient were reviewed by me and considered in my medical decision making (see chart for details).    At this time the patient does have some abdominal and back pain complaints that could be consistent with diverticulitis though my primary concern is with regards to the hematemesis and what could be variceal bleeding. The patient will need lab work, I anticipate the patient will need to be observed in the hospital due to the  risk for recurrent gastrointestinal bleeding.  D/w Dr. Oneida Alar at 3:10 PM - states IV protonix, available for consult as needed, no acute intervention.  CT Pending at this time.  Pt reexamined and has had no further vomiting.  CT shows diverticulitis - will admit D/w Dr. Myna Hidalgo - will admit.    Final Clinical Impressions(s) / ED Diagnoses   Final diagnoses:  Diverticulitis of large intestine without perforation or abscess without bleeding  Hematemesis with nausea    New Prescriptions  New Prescriptions   No medications on file     Noemi Chapel, MD 12/19/16 [phone]

## 2016-12-20 ENCOUNTER — Encounter (HOSPITAL_COMMUNITY): Payer: Self-pay | Admitting: *Deleted

## 2016-12-20 ENCOUNTER — Encounter (HOSPITAL_COMMUNITY)
Admission: EM | Disposition: A | Discharge status: Home / Self Care | Payer: Self-pay | Source: Home / Self Care | Attending: Internal Medicine

## 2016-12-20 HISTORY — PX: ESOPHAGEAL BANDING: SHX5518

## 2016-12-20 HISTORY — PX: ESOPHAGOGASTRODUODENOSCOPY: SHX5428

## 2016-12-20 LAB — MRSA PCR SCREENING: MRSA by PCR: NEGATIVE

## 2016-12-20 LAB — CBC
HCT: 25.2 % — ABNORMAL LOW (ref 36.0–46.0)
HCT: 26 % — ABNORMAL LOW (ref 36.0–46.0)
HEMATOCRIT: 27.2 % — AB (ref 36.0–46.0)
HEMOGLOBIN: 9.4 g/dL — AB (ref 12.0–15.0)
HEMOGLOBIN: 8.7 g/dL — AB (ref 12.0–15.0)
HEMOGLOBIN: 9.1 g/dL — AB (ref 12.0–15.0)
MCH: 30.3 pg (ref 26.0–34.0)
MCH: 30.6 pg (ref 26.0–34.0)
MCH: 30.7 pg (ref 26.0–34.0)
MCHC: 34.6 g/dL (ref 30.0–36.0)
MCHC: 34.5 g/dL (ref 30.0–36.0)
MCHC: 35 g/dL (ref 30.0–36.0)
MCV: 87.7 fL (ref 78.0–100.0)
MCV: 88.7 fL (ref 78.0–100.0)
MCV: 87.8 fL (ref 78.0–100.0)
Platelets: 64 10*3/uL — ABNORMAL LOW (ref 150–400)
Platelets: 45 10*3/uL — ABNORMAL LOW (ref 150–400)
Platelets: 67 10*3/uL — ABNORMAL LOW (ref 150–400)
RBC: 3.1 MIL/uL — AB (ref 3.87–5.11)
RBC: 2.84 MIL/uL — AB (ref 3.87–5.11)
RBC: 2.96 MIL/uL — AB (ref 3.87–5.11)
RDW: 13.6 % (ref 11.5–15.5)
RDW: 13.6 % (ref 11.5–15.5)
RDW: 13.4 % (ref 11.5–15.5)
WBC: 5 10*3/uL (ref 4.0–10.5)
WBC: 3.9 10*3/uL — ABNORMAL LOW (ref 4.0–10.5)
WBC: 5.3 10*3/uL (ref 4.0–10.5)

## 2016-12-20 LAB — COMPREHENSIVE METABOLIC PANEL
ALK PHOS: 51 U/L (ref 38–126)
ALT: 17 U/L (ref 14–54)
ANION GAP: 8 (ref 5–15)
AST: 18 U/L (ref 15–41)
Albumin: 2.7 g/dL — ABNORMAL LOW (ref 3.5–5.0)
BUN: 19 mg/dL (ref 6–20)
CHLORIDE: 105 mmol/L (ref 101–111)
CO2: 22 mmol/L (ref 22–32)
Calcium: 8.3 mg/dL — ABNORMAL LOW (ref 8.9–10.3)
Creatinine, Ser: 0.78 mg/dL (ref 0.44–1.00)
GFR calc Af Amer: >60 mL/min (ref >60)
GLUCOSE: 135 mg/dL — AB (ref 65–99)
POTASSIUM: 4.5 mmol/L (ref 3.5–5.1)
Sodium: 135 mmol/L (ref 135–145)
TOTAL PROTEIN: 5.3 g/dL — AB (ref 6.5–8.1)
Total Bilirubin: 2 mg/dL — ABNORMAL HIGH (ref 0.3–1.2)

## 2016-12-20 LAB — GLUCOSE, CAPILLARY
GLUCOSE-CAPILLARY: 132 mg/dL — AB (ref 65–99)
GLUCOSE-CAPILLARY: 218 mg/dL — AB (ref 65–99)
GLUCOSE-CAPILLARY: 237 mg/dL — AB (ref 65–99)
GLUCOSE-CAPILLARY: 268 mg/dL — AB (ref 65–99)
Glucose-Capillary: 218 mg/dL — ABNORMAL HIGH (ref 65–99)
Glucose-Capillary: 221 mg/dL — ABNORMAL HIGH (ref 65–99)
Glucose-Capillary: 224 mg/dL — ABNORMAL HIGH (ref 65–99)
Glucose-Capillary: 237 mg/dL — ABNORMAL HIGH (ref 65–99)

## 2016-12-20 LAB — PROTIME-INR
INR: 1.53
Prothrombin Time: 18.6 seconds — ABNORMAL HIGH (ref 11.4–15.2)

## 2016-12-20 LAB — APTT: aPTT: 36 seconds (ref 24–36)

## 2016-12-20 LAB — HEMOGLOBIN A1C
Hgb A1c MFr Bld: 10.9 % — ABNORMAL HIGH (ref 4.8–5.6)
MEAN PLASMA GLUCOSE: 266 mg/dL

## 2016-12-20 SURGERY — EGD (ESOPHAGOGASTRODUODENOSCOPY)
Anesthesia: Moderate Sedation

## 2016-12-20 MED ORDER — MIDAZOLAM HCL 5 MG/5ML IJ SOLN
INTRAMUSCULAR | Status: AC
  Filled 2016-12-20: qty 10

## 2016-12-20 MED ORDER — LIDOCAINE VISCOUS 2 % MT SOLN
OROMUCOSAL | Status: AC
  Filled 2016-12-20: qty 15

## 2016-12-20 MED ORDER — MIDAZOLAM HCL 5 MG/5ML IJ SOLN
INTRAMUSCULAR | Status: DC | PRN
  Administered 2016-12-20 (×2): 1 mg via INTRAVENOUS
  Administered 2016-12-20: 2 mg via INTRAVENOUS
  Administered 2016-12-20 (×2): 1 mg via INTRAVENOUS

## 2016-12-20 MED ORDER — STERILE WATER FOR IRRIGATION IR SOLN
Status: DC | PRN
  Administered 2016-12-20: 10:00:00

## 2016-12-20 MED ORDER — MEPERIDINE HCL 100 MG/ML IJ SOLN
INTRAMUSCULAR | Status: DC | PRN
  Administered 2016-12-20: 25 mg via INTRAVENOUS

## 2016-12-20 MED ORDER — SODIUM CHLORIDE 0.9 % IV SOLN
INTRAVENOUS | Status: DC
  Administered 2016-12-20 – 2016-12-24 (×6): via INTRAVENOUS

## 2016-12-20 MED ORDER — MEPERIDINE HCL 100 MG/ML IJ SOLN
INTRAMUSCULAR | Status: AC
  Filled 2016-12-20: qty 2

## 2016-12-20 MED ORDER — LIDOCAINE VISCOUS 2 % MT SOLN
OROMUCOSAL | Status: DC | PRN
  Administered 2016-12-20: 3 mL via OROMUCOSAL

## 2016-12-20 NOTE — Progress Notes (Signed)
Patient ID: Alisha [name], female   DOB: June 01, 1951, 66 y.o.   MRN: [id number]  PT WITH BORDERLINE BP. HAVING MAROON STOOLS AND BRBPR. DISCUSSED WITH NURSING-SBP >100 CURRENTLY. Hb DOWN 3 GMS. EGD AT [place] @1000 .

## 2016-12-20 NOTE — Interval H&P Note (Signed)
History and Physical Interval Note:  12/20/2016 10:14 AM  Alisha [name]  has presented today for surgery, with the diagnosis of hematemesis/melena  The various methods of treatment have been discussed with the patient and family. After consideration of risks, benefits and other options for treatment, the patient has consented to  Procedure(s): ESOPHAGOGASTRODUODENOSCOPY (EGD) (N/A) ESOPHAGEAL BANDING (N/A) as a surgical intervention .  The patient's history has been reviewed, patient examined, no change in status, stable for surgery.  I have reviewed the patient's chart and labs.  Questions were answered to the patient's satisfaction.     [employer]

## 2016-12-20 NOTE — Op Note (Signed)
[facility] Patient Name: Alisha [name] Procedure Date: 12/20/2016 9:41 AM MRN: [id number] Date of Birth: 21-Sep-1950 Attending MD: Barney Drain , MD CSN: [id number] Age: 66 Admit Type: Inpatient Procedure:                Upper GI endoscopy WITH ESOPHAGEAL BAND LIGATION Indications:              Hematemesis, Hematochezia, Melena PMHx:                            CIRRHOSIS/ESOPHAGEAL VARICES Providers:                Barney Drain, MD, Otis Peak B. Sharon Seller, RN, Bonnetta Barry, Technician Referring MD:             Norvel Richards, MD, Glenda Chroman Medicines:                Meperidine 25 mg IV, Midazolam 6 mg IV Complications:            No immediate complications. Estimated Blood Loss:     Estimated blood loss: 100 mL. Procedure:                Pre-Anesthesia Assessment:                           - Prior to the procedure, a History and Physical                            was performed, and patient medications and                            allergies were reviewed. The patient's tolerance of                            previous anesthesia was also reviewed. The risks                            and benefits of the procedure and the sedation                            options and risks were discussed with the patient.                            All questions were answered, and informed consent                            was obtained. Prior Anticoagulants: The patient has                            taken no previous anticoagulant or antiplatelet                            agents. ASA Grade Assessment: III - A patient with  severe systemic disease. After reviewing the risks                            and benefits, the patient was deemed in                            satisfactory condition to undergo the procedure.                            After obtaining informed consent, the endoscope was                            passed under  direct vision. Throughout the                            procedure, the patient's blood pressure, pulse, and                            oxygen saturations were monitored continuously. The                            [id number] ([id number]) scope was introduced through the                            mouth, and advanced to the second part of duodenum.                            The upper GI endoscopy was technically difficult                            and complex due to excessive bleeding. Successful                            completion of the procedure was aided by                            controlling the bleeding. The patient tolerated the                            procedure fairly well. Scope In: 10:25:08 AM Scope Out: 10:54:56 AM Total Procedure Duration: 0 hours 29 minutes 48 seconds  Findings:      TWO columns of non-bleeding grade II, & THREE COLUMNS OF grade III       varices were found in the lower third of the esophagus,. They were 10 mm       in largest diameter. Stigmata of recent bleeding were evident and red       wale signs were present. The varices appeared larger than they were at       prior exam. ATTEMPTED TO PLACE 8 BANDS , BUT Seven bands were       successfully placed with complete eradication, resulting in deflation of       varices. There was no bleeding at the end of the procedure. Estimated       blood loss: 100 mL.  Moderate portal hypertensive gastropathy was found in the gastric       fundus, in the gastric body and in the gastric antrum.      The examined duodenum was normal. Impression:               - HEMATEMESIS DUE TO Grade III esophageal varices.                            Banded.                           - MODERATE Portal hypertensive gastropathy. Moderate Sedation:      Moderate (conscious) sedation was administered by the endoscopy nurse       and supervised by the endoscopist. The following parameters were       monitored: oxygen saturation,  heart rate, blood pressure, and response       to care. Total physician intraservice time was 34 minutes. Recommendation:           - Return patient to ICU for ongoing care.                           - NPO EXCEPT ICE CHIPS/SIPS WITH MEDS.                           - Continue present medications. CONTINUE OCTREOTIDE                            @50  MCG/HR x 72 HRS-APR 11 @11  AM.                           - Repeat upper endoscopy in 2 weeks for retreatment.                           - Return to GI office in 5 months. Procedure Code(s):        --- Professional ---                           [phone], Esophagogastroduodenoscopy, flexible,                            transoral; with band ligation of esophageal/gastric                            varices                           99152, Moderate sedation services provided by the                            same physician or other qualified health care                            professional performing the diagnostic or                            therapeutic service that the sedation  supports,                            requiring the presence of an independent trained                            observer to assist in the monitoring of the                            patient's level of consciousness and physiological                            status; initial 15 minutes of intraservice time,                            patient age 35 years or older                           [phone], Moderate sedation services; each additional                            15 minutes intraservice time Diagnosis Code(s):        --- Professional ---                           I85.01, Esophageal varices with bleeding                           K76.6, Portal hypertension                           K31.89, Other diseases of stomach and duodenum                           K92.0, Hematemesis                           K92.1, Melena (includes Hematochezia) CPT copyright 2016 American Medical  Association. All rights reserved. The codes documented in this report are preliminary and upon coder review may  be revised to meet current compliance requirements. Barney Drain, MD Barney Drain, MD 12/20/2016 11:17:36 AM This report has been signed electronically. Number of Addenda: 0

## 2016-12-20 NOTE — Progress Notes (Signed)
PROGRESS NOTE    Alisha [name]  JYN:[id number] DOB: [birth year] DOA: 12/19/2016 PCP: Glenda Chroman, MD     Brief Narrative:  66 y/o woman admitted from home on 4/7 due to abdominal pain and hematemesis. GI performed bedside EGD on 4/7 with banding of esophageal varices.   Assessment & Plan:   Principal Problem:   Acute diverticulitis Active Problems:   Diabetes mellitus type II, non insulin dependent (HCC)   Hepatic cirrhosis (HCC)   Hematemesis   Hyponatremia   Thrombocytopenia (HCC)   Diverticulitis large intestine   Hematemesis -With esophageal varices on EGD, s/p binding. -No need for transfusion unless Hb <7. -Continue IV PPI and octreotide.  Acute Blood Loss Anemia -Due to hematemesis. -No need for transfusion at present. -Follow CBC q8 hours.  Acute Diverticulitis -Continue rocephin/flagyl  NASH. -With thrombocytopenia. -Avoid heparin products, no apparent ascites.  Lung Nodule -F/u with OP imaging.  DM II -Continue SSI, can adjust once GI resumes diet.   DVT prophylaxis: SCDs Code Status: full code patient only Family Communication: keep in ICU today Disposition Plan: Hope for DC home in 48-72 hours pending medical stability  Consultants:   GI  Procedures:   EGD  Antimicrobials:  Anti-infectives    Start     Dose/Rate Route Frequency Ordered Stop   12/20/16 0000  metroNIDAZOLE (FLAGYL) IVPB 500 mg     500 mg 100 mL/hr over 60 Minutes Intravenous Every 8 hours 12/19/16 1647     12/20/16 0000  cefTRIAXone (ROCEPHIN) 1 g in dextrose 5 % 50 mL IVPB  Status:  Discontinued     1 g 100 mL/hr over 30 Minutes Intravenous Every 24 hours 12/19/16 1647 12/19/16 1725   12/19/16 1800  cefTRIAXone (ROCEPHIN) 2 g in dextrose 5 % 50 mL IVPB     2 g 100 mL/hr over 30 Minutes Intravenous Every 24 hours 12/19/16 1725     12/19/16 1615  ciprofloxacin (CIPRO) IVPB 400 mg     400 mg 200 mL/hr over 60 Minutes Intravenous  Once 12/19/16 1614 12/19/16  1729   12/19/16 1615  metroNIDAZOLE (FLAGYL) tablet 500 mg     500 mg Oral  Once 12/19/16 1614 12/19/16 1630       Subjective: Has had maroon and frankly bloody stools this am prior to EGD.  Objective: Vitals:   12/20/16 1200 12/20/16 1300 12/20/16 1400 12/20/16 1500  BP:  (!) 118/59  116/63  Pulse:  86 89 91  Resp:  (!) 21 20 20   Temp: 98.5 F (36.9 C)     TempSrc: Oral     SpO2:  96% 100% 100%  Weight:      Height:        Intake/Output Summary (Last 24 hours) at 12/20/16 1702 Last data filed at 12/20/16 1500  Gross per 24 hour  Intake          2724.55 ml  Output              779 ml  Net          1945.55 ml   Filed Weights   12/19/16 1231 12/20/16 0500 12/20/16 0934  Weight: 80.7 kg (178 lb) 81 kg (178 lb 9.2 oz) 80.7 kg (178 lb)    Examination:  General exam: Alert, awake, oriented x 3 Respiratory system: Clear to auscultation. Respiratory effort normal. Cardiovascular system:RRR. No murmurs, rubs, gallops. Gastrointestinal system: Abdomen is nondistended, soft and nontender. No organomegaly or masses felt. Normal bowel sounds  heard. Central nervous system: Alert and oriented. No focal neurological deficits. Extremities: No C/C/E, +pedal pulses Skin: No rashes, lesions or ulcers Psychiatry: Judgement and insight appear normal. Mood & affect appropriate.     Data Reviewed: I have personally reviewed following labs and imaging studies  CBC:  Recent Labs Lab 12/19/16 1307 12/19/16 2153 12/20/16 0426 12/20/16 1225  WBC 10.1 5.4 5.0 3.9*  HGB 12.7 9.4* 9.4* 8.7*  HCT 36.4 26.9* 27.2* 25.2*  MCV 86.5 87.3 87.7 88.7  PLT 113* 64* 64* 45*   Basic Metabolic Panel:  Recent Labs Lab 12/19/16 1307 12/20/16 0426  NA 129* 135  K 5.0 4.5  CL 99* 105  CO2 20* 22  GLUCOSE 457* 135*  BUN 13 19  CREATININE 0.81 0.78  CALCIUM 9.1 8.3*   GFR: Estimated Creatinine Clearance: 66.6 mL/min (by C-G formula based on SCr of 0.78 mg/dL). Liver Function  Tests:  Recent Labs Lab 12/19/16 1307 12/20/16 0426  AST 26 18  ALT 23 17  ALKPHOS 73 51  BILITOT 3.7* 2.0*  PROT 6.9 5.3*  ALBUMIN 3.6 2.7*    Recent Labs Lab 12/19/16 1307  LIPASE 21   No results for input(s): AMMONIA in the last 168 hours. Coagulation Profile:  Recent Labs Lab 12/19/16 1307 12/20/16 0426  INR 1.26 1.53   Cardiac Enzymes: No results for input(s): CKTOTAL, CKMB, CKMBINDEX, TROPONINI in the last 168 hours. BNP (last 3 results) No results for input(s): PROBNP in the last 8760 hours. HbA1C:  Recent Labs  12/19/16 1307  HGBA1C 10.9*   CBG:  Recent Labs Lab 12/20/16 0416 12/20/16 0844 12/20/16 0931 12/20/16 1241 12/20/16 1641  GLUCAP 132* 218* 221* 218* 237*   Lipid Profile: No results for input(s): CHOL, HDL, LDLCALC, TRIG, CHOLHDL, LDLDIRECT in the last 72 hours. Thyroid Function Tests: No results for input(s): TSH, T4TOTAL, FREET4, T3FREE, THYROIDAB in the last 72 hours. Anemia Panel: No results for input(s): VITAMINB12, FOLATE, FERRITIN, TIBC, IRON, RETICCTPCT in the last 72 hours. Urine analysis: No results found for: COLORURINE, APPEARANCEUR, LABSPEC, PHURINE, GLUCOSEU, HGBUR, BILIRUBINUR, KETONESUR, PROTEINUR, UROBILINOGEN, NITRITE, LEUKOCYTESUR Sepsis Labs: @LABRCNTIP (procalcitonin:4,lacticidven:4)  ) Recent Results (from the past 240 hour(s))  MRSA PCR Screening     Status: None   Collection Time: 12/19/16  6:50 PM  Result Value Ref Range Status   MRSA by PCR NEGATIVE NEGATIVE Final    Comment:        The GeneXpert MRSA Assay (FDA approved for NASAL specimens only), is one component of a comprehensive MRSA colonization surveillance program. It is not intended to diagnose MRSA infection nor to guide or monitor treatment for MRSA infections.          Radiology Studies: Ct Abdomen Pelvis W Contrast  Result Date: 12/19/2016 CLINICAL DATA:  Left lower quadrant pain for 1 week. EXAM: CT ABDOMEN AND PELVIS WITH  CONTRAST TECHNIQUE: Multidetector CT imaging of the abdomen and pelvis was performed using the standard protocol following bolus administration of intravenous contrast. CONTRAST:  163m ISOVUE-300 IOPAMIDOL (ISOVUE-300) INJECTION 61%, 113mISOVUE-300 IOPAMIDOL (ISOVUE-300) INJECTION 61% COMPARISON:  MRI of the abdomen December 04, 2016, CT the abdomen and pelvis September 27, 2009 and PET-CT October 11, 2009 FINDINGS: Lower chest: There is a somewhat nodular opacity with air bronchograms in the right middle lobe. This was evaluated with PET-CT in 2011 and was not FDG avid. However, it is more substantial in appearance today. The most substantial component measures 1.5 by 2.4 cm today versus 0.6 x 2.2  cm in 2011. No new pulmonary nodules or masses. 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 Contrast is seen in the distal esophagus consistent with reflux. The lung bases are otherwise normal. Hepatobiliary: The liver demonstrates a nodular contour, particularly in the left hepatic lobe consistent with cirrhosis. The 8 mm lesion seen in segment 4 a is seen on series 2, image 13 today, better assessed on the recent MRI. It have not obviously grown in the interval. No other liver masses are seen. Hepatic steatosis is noted. The portal vein remains patent. There is re- cannulization of the umbilical vein. Cholelithiasis is seen without wall thickening or pericholecystic fluid. No intra or extrahepatic biliary duct dilatation. Pancreas: The cystic lesions in the pancreas described on the recent MRI are better assessed on that study. The largest in the pancreatic neck is similar since 2011. No suspicious solid masses identified. Spleen: The spleen demonstrates heterogeneous attenuation. No focal masses are seen. The spleen measures 15 cm in cranial caudal dimension which is stable. Adrenals/Urinary Tract: The adrenal glands are normal. The kidneys, ureters, and bladder are unremarkable as  well. Stomach/Bowel: The stomach and small bowel are normal. Colonic diverticulosis is identified. There is inflammation around a colonic diverticulum in the distal descending colon best seen on coronal image 53 consistent with diverticulitis. No evidence of perforation or complication. There is also a mildly inflamed diverticulum in the distal transverse colon without perforation or complication. Remainder of the colon is unremarkable. The appendix is normal. Vascular/Lymphatic: Atherosclerotic changes are seen in the non aneurysmal aorta. Worsening varices in the gastrohepatic ligament, anterior left abdominal wall on image 55, and paraesophageal region. Re- cannulization of the umbilical vein. Collateral vessels in the abdominal wall. No suspicious adenopathy. Reproductive: Status post hysterectomy. No adnexal masses. Other: No abdominal wall hernia or abnormality. No abdominopelvic ascites. Musculoskeletal: No acute or significant osseous findings. IMPRESSION: 1. Two sites of diverticulitis involving the distal transverse colon and the distal descending colon without perforation or complication. This likely explains the patient's symptoms. 2. An irregular nodular opacity in the right lung base was thought to be scarring in 2011. It is more nodular and substantial in appearance today. Despite the negative PET-CT in 2011, further evaluation is recommended to exclude a slow growing low-grade neoplasm. Further evaluation could be performed with a follow-up PET-CT or tissue diagnosis. Consider pulmonary consultation. 3. Cirrhosis. Worsening varices including in the paraesophageal region suggesting worsening portal venous hypertension. 4. The known liver lesion in segment 4A was better assessed on recent MRI. 5. Known pancreatic cystic lesions were better assessed on recent MRI. 6. Atherosclerosis. Electronically Signed   By: Dorise Bullion III M.D   On: 12/19/2016 16:03        Scheduled Meds: . cefTRIAXone  (ROCEPHIN)  IV  2 g Intravenous Q24H  . insulin aspart  0-15 Units Subcutaneous Q4H  . lidocaine      . meperidine      . metronidazole  500 mg Intravenous Q8H  . midazolam      . pantoprazole (PROTONIX) IV  40 mg Intravenous Q12H   Continuous Infusions: . sodium chloride 100 mL/hr at 12/20/16 0841  . octreotide  (SANDOSTATIN)    IV infusion 50 mcg/hr (12/20/16 1338)     LOS: 1 day    Time spent: 25 minutes. Greater than 50% of this time was spent in direct contact with the patient coordinating care.     Lelon Frohlich, MD [facility] Pager [phone]  If 7PM-7AM, please contact night-coverage [email or web address] Password [email or web address] 12/20/2016, 5:02 PM

## 2016-12-20 NOTE — H&P (View-Only) (Signed)
Referring Provider: No ref. provider found Primary Care Physician:  Glenda Chroman, MD Primary Gastroenterologist:  DR. Gala Romney  Reason for Consultation:  HEMATEMESIS  Impression: ADMITTED WITH HEMATEMESIS IN SETTING OF LOW PLTS/CIRRHOSIS. DIFFERENTIAL DIAGNOSIS INCLUDES: MALLORY WEISS TEAR, VARICEAL BLEED, PORTAL HYPERTENSIVE GASTROPATHY, LESS LIKELY PUD, DIEULAFOY'S LESION, OR AVM. LAST EGD AUG 2017: GRADE II ESOPHAGEAL VARICES, CHID PUGH A(6), MELD 12-COMPENSATED LIVER DISEASE. NOW ON ADMISSION APR 2018: MELD SCORE-21, CHILD PUGH B(7)-PARTIALLY DECOMPENSATED LIVER DISEASE. Hb stable in ED. NOW WITH ACUTE UNCOMPLICATED DIVERTICULITIS.  Plan: 1. OCTREOTIDE GTT. CBC Q8H. 2. PROTONIX 40 MG IV Q12H 3. CLEAR LIQUID DIET. NPO AFTER MN EXCEPT MEDS 4. CONSIDER EGD IN AM IF MEDICALLY STABLE. DISCUSSED PROCEDURE, BENEFITS, & RISKS: < 1% chance of medication reaction, bleeding, OR perforation. 5.  ZOFRAN QAC/HS 6. ADD ABX TO COVER DIVERTICULITIS. I PERSONALLY REVIEWED THE CT WITH DR. Jimmye Norman.  7. CONSIDER TCS AS OUTPATIENT DUE TO ATYPICAL PRESENTATION OF DIVERTICULITIS.  1750: DISCUSSED WITH DR. Myna Hidalgo, PT, AND FAMILY.  HPI:  PT WAS IN HER USUAL STATE OF HEALTH AND DEVELOPED ONSET OF LLQ PAIN AND NAUSEA ON TUES. PAIN IN LLQ: STABBING/CRAMPY AND GETTING WORSE. HAD ONE EPISODE OF BLOODY VOMIT x1. APPETITE: POOR.   PT DENIES FEVER, CHILLS, HEMATOCHEZIA, nausea, vomiting, melena, diarrhea, CHEST PAIN, SHORTNESS OF BREATH,  CHANGE IN BOWEL IN HABITS, constipation, problems swallowing, problems with sedation, heartburn or indigestion.   Past Medical History:  Diagnosis Date  . B12 deficiency 11/03/2016  . B12 deficiency 11/03/2016  . Barrett's esophagus   . Cirrhosis of liver without mention of alcohol    hep B surface antigen and HCV ab negative.pt has not had hepatitis A and B vaccines.U/S on 07/04/13 shows cirrhosis.  . Diverticula of colon    pancolonic  . Esophageal reflux   . Esophageal varices  ([place])   . Hiatal hernia   . Iron deficiency anemia, unspecified   . Other and unspecified hyperlipidemia   . Portal hypertensive gastropathy   . Tubular adenoma   . Type II or unspecified type diabetes mellitus without mention of complication, not stated as uncontrolled   . Unspecified essential hypertension   . Unspecified hemorrhoids without mention of complication     Past Surgical History:  Procedure Laterality Date  . ABDOMINAL HYSTERECTOMY    . BIOPSY  04/30/2016   Procedure: BIOPSY;  Surgeon: Daneil Dolin, MD;  Location: [facility];  Service: Endoscopy;;  esophagus  . CESAREAN SECTION     x 2  . COLONOSCOPY  03/2006   left sided diverticula, splenic flexure tublar adenoma  . COLONOSCOPY  07/2002   villous tubular adenoma in rectum  . COLONOSCOPY  09/23/09   external hemorrhoids/scattered pan colonic diverticula otherwise normal. cecal lipoma bx negative, normal TI. Next TCS 09/2014  . COLONOSCOPY N/A 11/12/2014   Procedure: COLONOSCOPY;  Surgeon: Daneil Dolin, MD;  Location: [facility];  Service: Endoscopy;  Laterality: N/A;  1215 - moved to 12:30 - Ginger to notify pt  . ESOPHAGOGASTRODUODENOSCOPY  08/2006   barretts esophagus, no dyplasia  . ESOPHAGOGASTRODUODENOSCOPY  03/2006   barretts esophagus,bx focal atypia c/w low grade dysplasia, SB bx negative for celiac  . ESOPHAGOGASTRODUODENOSCOPY  09/23/09   3 columns of grade 2 esophageal varices/hiatal hernia/3-4 cm segment Barrett's esophagus without dysplasia. Next EGD 09/2012  . ESOPHAGOGASTRODUODENOSCOPY N/A 11/11/2012   Dr. Gala Romney- Barrett;s esophagus, esophageal varices, portal gastopathy. hiatal hernia  . ESOPHAGOGASTRODUODENOSCOPY N/A 04/30/2016   Procedure: ESOPHAGOGASTRODUODENOSCOPY (EGD);  Surgeon: Daneil Dolin, MD;  Location: [facility];  Service: Endoscopy;  Laterality: N/A;  815  . [place]  2008  . small bowel capsule endoscopy  10/2009   normal    Prior to Admission medications    Medication Sig Start Date End Date Taking? Authorizing Provider  cyanocobalamin (,VITAMIN B-12,) 1000 MCG/ML injection Inject 1,000 mcg into the muscle every 30 (thirty) days.   Yes Historical Provider, MD  GLIPIZIDE XL 10 MG 24 hr tablet Take 10 mg by mouth daily.  02/24/12  Yes Historical Provider, MD  lisinopril (PRINIVIL,ZESTRIL) 10 MG tablet Take 10 mg by mouth daily.  04/09/12  Yes Historical Provider, MD  metFORMIN (GLUCOPHAGE) 1000 MG tablet Take 1,000 mg by mouth 2 (two) times daily with a meal.  02/09/12  Yes Historical Provider, MD  NEXIUM 40 MG capsule Take 40 mg by mouth daily before breakfast.  04/14/12  Yes Historical Provider, MD  propranolol (INDERAL) 10 MG tablet Take 3 tablets in the morning and 2 in the evening 05/22/16  Yes Annitta Needs, NP  VICTOZA 18 MG/3ML SOLN Inject 1.8 mg into the skin daily.  04/15/12  Yes Historical Provider, MD   Allergies as of 12/19/2016 - Review Complete 12/19/2016  Allergen Reaction Noted  . Codeine Nausea And Vomiting 04/19/2012   Family History  Problem Relation Age of Onset  . Alzheimer's disease Mother   . CAD Father   . Diabetes Mellitus II Father   . Alzheimer's disease Father   . Diabetes Mellitus II Sister   . Cancer - Other Brother   . Diabetes Mellitus II Brother   . Hypertension Brother    Social History   Social History  . Marital status: Married    Spouse name: N/A  . Number of children: N/A  . Years of education: N/A   Occupational History  . Not on file.   Social History Main Topics  . Smoking status: Never Smoker  . Smokeless tobacco: Never Used  . Alcohol use No  . Drug use: No  . Sexual activity: Yes   Other Topics Concern  . Not on file   Social History Narrative  . No narrative on file    Review of Systems: PER HPI OTHERWISE ALL SYSTEMS ARE NEGATIVE. LAST TCS 2013:  DIVERTICULOSIS.   Vitals: Blood pressure 124/66, pulse 93, temperature 99.1 F (37.3 C), temperature source Oral, resp. rate (!) 21,  height 5' 1"  (1.549 m), weight 178 lb (80.7 kg), SpO2 97 %.  Physical Exam: General:   Alert,  Well-developed, well-nourished, pleasant and cooperative in NAD Head:  Normocephalic and atraumatic. Eyes:  Sclera clear, icterus.   Conjunctiva pink. Mouth:  No lesions, dentition ABnormal. Neck:  Supple; no masses. Lungs:  Clear throughout to auscultation.   No wheezes. No acute distress. Heart:  Regular rate and rhythm; no murmurs. Abdomen:  Soft, MILDLY tender IN LLQ, NO REBOUND OR GUARDING, nondistended. No masses noted. Normal bowel sounds, without guarding, and without rebound.   Msk:  Symmetrical without gross deformities. Normal posture. Extremities:  Without edema. Neurologic:  Alert and  oriented x4;  grossly normal neurologically. Cervical Nodes:  No significant cervical adenopathy. Psych:  Alert and cooperative. Normal mood and affect.   Lab Results:  Recent Labs  12/19/16 1307  WBC 10.1  HGB 12.7  HCT 36.4  PLT 113*   BMET  Recent Labs  12/19/16 1307  NA 129*  K 5.0  CL 99*  CO2 20*  GLUCOSE 457*  BUN 13  CREATININE 0.81  CALCIUM 9.1   LFT  Recent Labs  12/19/16 1307  PROT 6.9  ALBUMIN 3.6  AST 26  ALT 23  ALKPHOS 73  BILITOT 3.7*   Studies/Results: MAR 2018: MRCP-[facility]ONES. APR 2018: CT APR 7-DIVERTICULITIS IN DISTAL TC/DESCENDING COLON   LOS: 0 days   Eliyanna Ault  12/19/2016, 3:47 PM

## 2016-12-20 NOTE — Progress Notes (Signed)
Patient advanced to clear liquid diet for dinner, patient ate about 25% tolerated well. Patient alert and awake, with  No complaints.

## 2016-12-20 NOTE — Progress Notes (Signed)
Paged MD to advised Hgb changed from 9.4 to 8.7 and Hct 27.2 to 25.2.

## 2016-12-20 NOTE — Progress Notes (Signed)
Patient awake and alert after EGD. Vital signs stable, no current complaints. Patient husband at bedside. Will continue to monitor patient.

## 2016-12-21 DIAGNOSIS — I8501 Esophageal varices with bleeding: Secondary | ICD-10-CM

## 2016-12-21 DIAGNOSIS — K5792 Diverticulitis of intestine, part unspecified, without perforation or abscess without bleeding: Secondary | ICD-10-CM

## 2016-12-21 LAB — CBC
HCT: 25.9 % — ABNORMAL LOW (ref 36.0–46.0)
Hemoglobin: 9 g/dL — ABNORMAL LOW (ref 12.0–15.0)
MCH: 30.5 pg (ref 26.0–34.0)
MCHC: 34.7 g/dL (ref 30.0–36.0)
MCV: 87.8 fL (ref 78.0–100.0)
PLATELETS: 49 10*3/uL — AB (ref 150–400)
RBC: 2.95 MIL/uL — ABNORMAL LOW (ref 3.87–5.11)
RDW: 13.2 % (ref 11.5–15.5)
WBC: 3.2 10*3/uL — AB (ref 4.0–10.5)

## 2016-12-21 LAB — PROTIME-INR
INR: 1.33
Prothrombin Time: 16.6 seconds — ABNORMAL HIGH (ref 11.4–15.2)

## 2016-12-21 LAB — COMPREHENSIVE METABOLIC PANEL
ALBUMIN: 3 g/dL — AB (ref 3.5–5.0)
ALT: 22 U/L (ref 14–54)
ANION GAP: 7 (ref 5–15)
AST: 32 U/L (ref 15–41)
Alkaline Phosphatase: 52 U/L (ref 38–126)
BUN: 8 mg/dL (ref 6–20)
CALCIUM: 8.1 mg/dL — AB (ref 8.9–10.3)
CHLORIDE: 104 mmol/L (ref 101–111)
CO2: 21 mmol/L — ABNORMAL LOW (ref 22–32)
Creatinine, Ser: 0.68 mg/dL (ref 0.44–1.00)
GFR calc Af Amer: >60 mL/min (ref >60)
GLUCOSE: 284 mg/dL — AB (ref 65–99)
POTASSIUM: 4 mmol/L (ref 3.5–5.1)
Sodium: 132 mmol/L — ABNORMAL LOW (ref 135–145)
TOTAL PROTEIN: 5.7 g/dL — AB (ref 6.5–8.1)
Total Bilirubin: 1.4 mg/dL — ABNORMAL HIGH (ref 0.3–1.2)

## 2016-12-21 LAB — GLUCOSE, CAPILLARY
Glucose-Capillary: 280 mg/dL — ABNORMAL HIGH (ref 65–99)
Glucose-Capillary: 261 mg/dL — ABNORMAL HIGH (ref 65–99)
Glucose-Capillary: 270 mg/dL — ABNORMAL HIGH (ref 65–99)
Glucose-Capillary: 293 mg/dL — ABNORMAL HIGH (ref 65–99)
Glucose-Capillary: 261 mg/dL — ABNORMAL HIGH (ref 65–99)

## 2016-12-21 MED ORDER — INSULIN ASPART 100 UNIT/ML ~~LOC~~ SOLN
0.0000 [IU] | Freq: Three times a day (TID) | SUBCUTANEOUS | Status: DC
  Administered 2016-12-22: 8 [IU] via SUBCUTANEOUS
  Administered 2016-12-22 (×2): 11 [IU] via SUBCUTANEOUS
  Administered 2016-12-23: 3 [IU] via SUBCUTANEOUS
  Administered 2016-12-24: 8 [IU] via SUBCUTANEOUS
  Administered 2016-12-24: 5 [IU] via SUBCUTANEOUS

## 2016-12-21 NOTE — Progress Notes (Signed)
Inpatient Diabetes Program Recommendations  AACE/ADA: New Consensus Statement on Inpatient Glycemic Control (2015)  Target Ranges:  Prepandial:   less than 140 mg/dL      Peak postprandial:   less than 180 mg/dL (1-2 hours)      Critically ill patients:  140 - 180 mg/dL   Results for Alisha [name], Alisha [name] (MRN [id number]) as of 12/21/2016 08:56  Ref. Range 12/20/2016 08:44 12/20/2016 09:31 12/20/2016 12:41 12/20/2016 16:41 12/20/2016 19:48 12/20/2016 23:44 12/21/2016 05:12 12/21/2016 07:24  Glucose-Capillary Latest Ref Range: 65 - 99 mg/dL 218 (H) 221 (H) 218 (H) 237 (H) 268 (H) 224 (H) 280 (H) 261 (H)   Review of Glycemic Control  Diabetes history: DM2 Outpatient Diabetes medications: Metformin 1000 mg BID, Victoza 1.8 mg daily, Glipizide XL 10 mg daily Current orders for Inpatient glycemic control: Novolog 0-15 units Q4H  Inpatient Diabetes Program Recommendations: Insulin - Basal: Over the past 24 hours, glucose has ranged from 218-280 mg/dl and patient has received a total of Novolog 31 units for correction.  Please consider ordering Levemir 15 units Q24H (starting now). HgbA1C: A1C 10.9% on 12/19/16 indicating an average glucose of 266 mg/dl over the past 2-3 months. MD may want to consider prescribing insulin at time of discharge to improve glycemic control. Patient needs to follow up with PCP.  Thanks, Barnie Alderman, RN, MSN, CDE Diabetes Coordinator Inpatient Diabetes Program [phone] (Team Pager from 8am to 5pm)

## 2016-12-21 NOTE — Progress Notes (Signed)
Subjective: Some nausea this morning, improved with medication. No abdominal pain. Dark stool with BMs "every time I go but not bright red". Tolerating clear liquids. Not ready for solids but would like to try full liquids.   Objective: Vital signs in last 24 hours: Temp:  [98 F (36.7 C)-98.6 F (37 C)] 98 F (36.7 C) (04/09 0725) Pulse Rate:  [78-121] 94 (04/09 0725) Resp:  [11-33] 27 (04/09 0725) BP: (89-133)/(45-87) 124/58 (04/08 1800) SpO2:  [95 %-100 %] 96 % (04/09 0725) Weight:  [178 lb (80.7 kg)-183 lb 3.2 oz (83.1 kg)] 183 lb 3.2 oz (83.1 kg) (04/09 0500) Last BM Date: 12/20/16 General:   Alert and oriented, pleasant Head:  Normocephalic and atraumatic. Eyes:  No icterus, sclera clear. Conjuctiva pink.  Mouth:  Without lesions, mucosa pink and moist.  Abdomen:  Bowel sounds present, soft, non-tender, non-distended. No HSM or hernias noted. No rebound or guarding. No masses appreciated  Extremities:  Without  edema. Neurologic:  Alert and  oriented x4 Psych:  Alert and cooperative. Normal mood and affect.  Intake/Output from previous day: 04/08 0701 - 04/09 0700 In: 2880.4 [P.O.:390; I.V.:2140.4; IV Piggyback:350] Out: 303 [Urine:301; Stool:2] Intake/Output this shift: No intake/output data recorded.  Lab Results:  Recent Labs  12/20/16 0426 12/20/16 1225 12/20/16 2245  WBC 5.0 3.9* 5.3  HGB 9.4* 8.7* 9.1*  HCT 27.2* 25.2* 26.0*  PLT 64* 45* 67*   BMET  Recent Labs  12/19/16 1307 12/20/16 0426  NA 129* 135  K 5.0 4.5  CL 99* 105  CO2 20* 22  GLUCOSE 457* 135*  BUN 13 19  CREATININE 0.81 0.78  CALCIUM 9.1 8.3*   LFT  Recent Labs  12/19/16 1307 12/20/16 0426  PROT 6.9 5.3*  ALBUMIN 3.6 2.7*  AST 26 18  ALT 23 17  ALKPHOS 73 51  BILITOT 3.7* 2.0*   PT/INR  Recent Labs  12/19/16 1307 12/20/16 0426  LABPROT 15.9* 18.6*  INR 1.26 1.53   Studies/Results: Ct Abdomen Pelvis W Contrast  Result Date: 12/19/2016 CLINICAL DATA:  Left  lower quadrant pain for 1 week. EXAM: CT ABDOMEN AND PELVIS WITH CONTRAST TECHNIQUE: Multidetector CT imaging of the abdomen and pelvis was performed using the standard protocol following bolus administration of intravenous contrast. CONTRAST:  116m ISOVUE-300 IOPAMIDOL (ISOVUE-300) INJECTION 61%, 165mISOVUE-300 IOPAMIDOL (ISOVUE-300) INJECTION 61% COMPARISON:  MRI of the abdomen December 04, 2016, CT the abdomen and pelvis September 27, 2009 and PET-CT October 11, 2009 FINDINGS: Lower chest: There is a somewhat nodular opacity with air bronchograms in the right middle lobe. This was evaluated with PET-CT in 2011 and was not FDG avid. However, it is more substantial in appearance today. The most substantial component measures 1.5 by 2.4 cm today versus 0.6 x 2.2 cm in 2011. No new pulmonary nodules or masses. 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 Contrast is seen in the distal esophagus consistent with reflux. The lung bases are otherwise normal. Hepatobiliary: The liver demonstrates a nodular contour, particularly in the left hepatic lobe consistent with cirrhosis. The 8 mm lesion seen in segment 4 a is seen on series 2, image 13 today, better assessed on the recent MRI. It have not obviously grown in the interval. No other liver masses are seen. Hepatic steatosis is noted. The portal vein remains patent. There is re- cannulization of the umbilical vein. Cholelithiasis is seen without wall thickening or pericholecystic fluid. No intra or extrahepatic biliary  duct dilatation. Pancreas: The cystic lesions in the pancreas described on the recent MRI are better assessed on that study. The largest in the pancreatic neck is similar since 2011. No suspicious solid masses identified. Spleen: The spleen demonstrates heterogeneous attenuation. No focal masses are seen. The spleen measures 15 cm in cranial caudal dimension which is stable. Adrenals/Urinary Tract: The adrenal glands are  normal. The kidneys, ureters, and bladder are unremarkable as well. Stomach/Bowel: The stomach and small bowel are normal. Colonic diverticulosis is identified. There is inflammation around a colonic diverticulum in the distal descending colon best seen on coronal image 53 consistent with diverticulitis. No evidence of perforation or complication. There is also a mildly inflamed diverticulum in the distal transverse colon without perforation or complication. Remainder of the colon is unremarkable. The appendix is normal. Vascular/Lymphatic: Atherosclerotic changes are seen in the non aneurysmal aorta. Worsening varices in the gastrohepatic ligament, anterior left abdominal wall on image 55, and paraesophageal region. Re- cannulization of the umbilical vein. Collateral vessels in the abdominal wall. No suspicious adenopathy. Reproductive: Status post hysterectomy. No adnexal masses. Other: No abdominal wall hernia or abnormality. No abdominopelvic ascites. Musculoskeletal: No acute or significant osseous findings. IMPRESSION: 1. Two sites of diverticulitis involving the distal transverse colon and the distal descending colon without perforation or complication. This likely explains the patient's symptoms. 2. An irregular nodular opacity in the right lung base was thought to be scarring in 2011. It is more nodular and substantial in appearance today. Despite the negative PET-CT in 2011, further evaluation is recommended to exclude a slow growing low-grade neoplasm. Further evaluation could be performed with a follow-up PET-CT or tissue diagnosis. Consider pulmonary consultation. 3. Cirrhosis. Worsening varices including in the paraesophageal region suggesting worsening portal venous hypertension. 4. The known liver lesion in segment 4A was better assessed on recent MRI. 5. Known pancreatic cystic lesions were better assessed on recent MRI. 6. Atherosclerosis. Electronically Signed   By: Dorise Bullion III M.D   On:  12/19/2016 16:03    Assessment: 66 year old female with history of NASH cirrhosis, 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 Octreotide to continue through April 11, with need for repeat EGD in 2 weeks. On Rocephin, starting 12/19/16. Dark stool noted with BMs per patient, likely old blood. Recheck CBC this morning.   Also noted to have uncomplicated diverticulitis involving distal transverse and distal descending colon. Flagyl IV TID. Will need outpatient colonoscopy due to atypical presentation. Clinically improving.   Right lung nodule: [name] further evaluation to exclude slow growing low-grade neoplasm as outpatient.   Plan: Continue octreotide through April 11 Continue Rocephin and Flagyl Will advance diet to full liquids.  Continue PPI BID Recheck CBC, CMP, INR this morning Outpatient evaluation of lung nodule   Alisha [name], ANP-BC [facility]     LOS: 2 days    12/21/2016, 7:43 AM

## 2016-12-21 NOTE — Progress Notes (Signed)
Reviewed labs ordered from this morning. Calculated MELD Na is 17. Hgb steady at 9. Will continue to follow.  Annitta Needs, ANP-BC [facility]

## 2016-12-21 NOTE — Progress Notes (Signed)
PROGRESS NOTE    Alisha [name]  LDJ:[id number] DOB: June 27, 1951 DOA: 12/19/2016 PCP: Glenda Chroman, MD     Brief Narrative:  66 y/o woman admitted from home on 4/7 due to abdominal pain and hematemesis. GI performed bedside EGD on 4/7 with banding of esophageal varices.   Assessment & Plan:   Principal Problem:   Acute diverticulitis Active Problems:   Diabetes mellitus type II, non insulin dependent (HCC)   Hepatic cirrhosis (HCC)   Hematemesis   Hyponatremia   Thrombocytopenia (HCC)   Diverticulitis large intestine   Hematemesis -With esophageal varices on EGD, s/p binding. -No need for transfusion unless Hb <7. -Continue IV PPI and octreotide until 4/11 per GI recs. -Also on rocephin.  Acute Blood Loss Anemia -Due to hematemesis. -No need for transfusion at present. -Follow CBC q8 hours.  Acute Diverticulitis -Continue rocephin/flagyl  NASH. -With thrombocytopenia. -Avoid heparin products, no apparent ascites.  Lung Nodule -F/u with OP imaging.  DM II -Continue SSI, can adjust once GI resumes diet.   DVT prophylaxis: SCDs Code Status: full code patient only Family Communication: keep in ICU today Disposition Plan: Hope for DC home in 48-72 hours pending medical stability  Consultants:   GI  Procedures:   EGD  Antimicrobials:  Anti-infectives    Start     Dose/Rate Route Frequency Ordered Stop   12/20/16 0000  metroNIDAZOLE (FLAGYL) IVPB 500 mg     500 mg 100 mL/hr over 60 Minutes Intravenous Every 8 hours 12/19/16 1647     12/20/16 0000  cefTRIAXone (ROCEPHIN) 1 g in dextrose 5 % 50 mL IVPB  Status:  Discontinued     1 g 100 mL/hr over 30 Minutes Intravenous Every 24 hours 12/19/16 1647 12/19/16 1725   12/19/16 1800  cefTRIAXone (ROCEPHIN) 2 g in dextrose 5 % 50 mL IVPB     2 g 100 mL/hr over 30 Minutes Intravenous Every 24 hours 12/19/16 1725     12/19/16 1615  ciprofloxacin (CIPRO) IVPB 400 mg     400 mg 200 mL/hr over 60 Minutes  Intravenous  Once 12/19/16 1614 12/19/16 1729   12/19/16 1615  metroNIDAZOLE (FLAGYL) tablet 500 mg     500 mg Oral  Once 12/19/16 1614 12/19/16 1630       Subjective: Some black stools this am.  Objective: Vitals:   12/21/16 1500 12/21/16 1600 12/21/16 1607 12/21/16 1700  BP: 133/67 (!) 131/55  (!) 127/57  Pulse: 86 99 88 88  Resp: (!) 22 (!) 30 13 20   Temp:   98 F (36.7 C)   TempSrc:   Oral   SpO2: 96% 98% 99% 98%  Weight:      Height:        Intake/Output Summary (Last 24 hours) at 12/21/16 1823 Last data filed at 12/21/16 1700  Gross per 24 hour  Intake             4265 ml  Output                0 ml  Net             4265 ml   Filed Weights   12/20/16 0500 12/20/16 0934 12/21/16 0500  Weight: 81 kg (178 lb 9.2 oz) 80.7 kg (178 lb) 83.1 kg (183 lb 3.2 oz)    Examination:  General exam: Alert, awake, oriented x 3 Respiratory system: Clear to auscultation. Respiratory effort normal. Cardiovascular system:RRR. No murmurs, rubs, gallops. Gastrointestinal system: Abdomen  is nondistended, soft and nontender. No organomegaly or masses felt. Normal bowel sounds heard. Central nervous system: Alert and oriented. No focal neurological deficits. Extremities: No C/C/E, +pedal pulses Skin: No rashes, lesions or ulcers Psychiatry: Judgement and insight appear normal. Mood & affect appropriate.     Data Reviewed: I have personally reviewed following labs and imaging studies  CBC:  Recent Labs Lab 12/19/16 2153 12/20/16 0426 12/20/16 1225 12/20/16 2245 12/21/16 1012  WBC 5.4 5.0 3.9* 5.3 3.2*  HGB 9.4* 9.4* 8.7* 9.1* 9.0*  HCT 26.9* 27.2* 25.2* 26.0* 25.9*  MCV 87.3 87.7 88.7 87.8 87.8  PLT 64* 64* 45* 67* 49*   Basic Metabolic Panel:  Recent Labs Lab 12/19/16 1307 12/20/16 0426 12/21/16 1012  NA 129* 135 132*  K 5.0 4.5 4.0  CL 99* 105 104  CO2 20* 22 21*  GLUCOSE 457* 135* 284*  BUN 13 19 8   CREATININE 0.81 0.78 0.68  CALCIUM 9.1 8.3* 8.1*    GFR: Estimated Creatinine Clearance: 67.6 mL/min (by C-G formula based on SCr of 0.68 mg/dL). Liver Function Tests:  Recent Labs Lab 12/19/16 1307 12/20/16 0426 12/21/16 1012  AST 26 18 32  ALT 23 17 22   ALKPHOS 73 51 52  BILITOT 3.7* 2.0* 1.4*  PROT 6.9 5.3* 5.7*  ALBUMIN 3.6 2.7* 3.0*    Recent Labs Lab 12/19/16 1307  LIPASE 21   No results for input(s): AMMONIA in the last 168 hours. Coagulation Profile:  Recent Labs Lab 12/19/16 1307 12/20/16 0426 12/21/16 1012  INR 1.26 1.53 1.33   Cardiac Enzymes: No results for input(s): CKTOTAL, CKMB, CKMBINDEX, TROPONINI in the last 168 hours. BNP (last 3 results) No results for input(s): PROBNP in the last 8760 hours. HbA1C:  Recent Labs  12/19/16 1307  HGBA1C 10.9*   CBG:  Recent Labs Lab 12/20/16 2344 12/21/16 0512 12/21/16 0724 12/21/16 1115 12/21/16 1606  GLUCAP 224* 280* 261* 270* 293*   Lipid Profile: No results for input(s): CHOL, HDL, LDLCALC, TRIG, CHOLHDL, LDLDIRECT in the last 72 hours. Thyroid Function Tests: No results for input(s): TSH, T4TOTAL, FREET4, T3FREE, THYROIDAB in the last 72 hours. Anemia Panel: No results for input(s): VITAMINB12, FOLATE, FERRITIN, TIBC, IRON, RETICCTPCT in the last 72 hours. Urine analysis: No results found for: COLORURINE, APPEARANCEUR, LABSPEC, [place], GLUCOSEU, HGBUR, BILIRUBINUR, KETONESUR, PROTEINUR, UROBILINOGEN, NITRITE, LEUKOCYTESUR Sepsis Labs: @LABRCNTIP (procalcitonin:4,lacticidven:4)  ) Recent Results (from the past 240 hour(s))  MRSA PCR Screening     Status: None   Collection Time: 12/19/16  6:50 PM  Result Value Ref Range Status   MRSA by PCR NEGATIVE NEGATIVE Final    Comment:        The GeneXpert MRSA Assay (FDA approved for NASAL specimens only), is one component of a comprehensive MRSA colonization surveillance program. It is not intended to diagnose MRSA infection nor to guide or monitor treatment for MRSA infections.           Radiology Studies: No results found.      Scheduled Meds: . cefTRIAXone (ROCEPHIN)  IV  2 g Intravenous Q24H  . insulin aspart  0-15 Units Subcutaneous Q4H  . metronidazole  500 mg Intravenous Q8H  . pantoprazole (PROTONIX) IV  40 mg Intravenous Q12H   Continuous Infusions: . sodium chloride 100 mL/hr at 12/21/16 0847  . octreotide  (SANDOSTATIN)    IV infusion 50 mcg/hr (12/21/16 1246)     LOS: 2 days    Time spent: 25 minutes. Greater than 50% of this time  was spent in direct contact with the patient coordinating care.     Lelon Frohlich, MD [facility] Pager [phone]  If 7PM-7AM, please contact night-coverage [email or web address] Password [facility] 12/21/2016, 6:23 PM

## 2016-12-22 ENCOUNTER — Encounter (HOSPITAL_COMMUNITY): Payer: Self-pay | Admitting: Gastroenterology

## 2016-12-22 LAB — BASIC METABOLIC PANEL
Anion gap: 7 (ref 5–15)
BUN: 5 mg/dL — AB (ref 6–20)
CO2: 21 mmol/L — ABNORMAL LOW (ref 22–32)
Calcium: 8.1 mg/dL — ABNORMAL LOW (ref 8.9–10.3)
Chloride: 106 mmol/L (ref 101–111)
Creatinine, Ser: 0.64 mg/dL (ref 0.44–1.00)
GFR calc non Af Amer: >60 mL/min (ref >60)
Glucose, Bld: 304 mg/dL — ABNORMAL HIGH (ref 65–99)
POTASSIUM: 4.1 mmol/L (ref 3.5–5.1)
SODIUM: 134 mmol/L — AB (ref 135–145)

## 2016-12-22 LAB — CBC
HEMATOCRIT: 23.4 % — AB (ref 36.0–46.0)
Hemoglobin: 7.8 g/dL — ABNORMAL LOW (ref 12.0–15.0)
MCH: 30.1 pg (ref 26.0–34.0)
MCHC: 33.3 g/dL (ref 30.0–36.0)
MCV: 90.3 fL (ref 78.0–100.0)
PLATELETS: 47 10*3/uL — AB (ref 150–400)
RBC: 2.59 MIL/uL — AB (ref 3.87–5.11)
RDW: 12.9 % (ref 11.5–15.5)
WBC: 2.9 10*3/uL — AB (ref 4.0–10.5)

## 2016-12-22 LAB — URINALYSIS, ROUTINE W REFLEX MICROSCOPIC
Bacteria, UA: NONE SEEN
Bilirubin Urine: NEGATIVE
Glucose, UA: >=500 mg/dL — AB
Hgb urine dipstick: NEGATIVE
Ketones, ur: 20 mg/dL — AB
Leukocytes, UA: NEGATIVE
Nitrite: NEGATIVE
Protein, ur: NEGATIVE mg/dL
RBC / HPF: NONE SEEN RBC/hpf (ref 0–5)
Specific Gravity, Urine: 1.013 (ref 1.005–1.030)
pH: 5 (ref 5.0–8.0)

## 2016-12-22 LAB — CBC WITH DIFFERENTIAL/PLATELET
BASOS PCT: 0 %
Basophils Absolute: 0 10*3/uL (ref 0.0–0.1)
EOS ABS: 0.1 10*3/uL (ref 0.0–0.7)
Eosinophils Relative: 2 %
HCT: 23.7 % — ABNORMAL LOW (ref 36.0–46.0)
HEMOGLOBIN: 8.1 g/dL — AB (ref 12.0–15.0)
LYMPHS ABS: 0.7 10*3/uL (ref 0.7–4.0)
Lymphocytes Relative: 22 %
MCH: 30.1 pg (ref 26.0–34.0)
MCHC: 34.2 g/dL (ref 30.0–36.0)
MCV: 88.1 fL (ref 78.0–100.0)
Monocytes Absolute: 0.2 10*3/uL (ref 0.1–1.0)
Monocytes Relative: 8 %
NEUTROS ABS: 2.1 10*3/uL (ref 1.7–7.7)
NEUTROS PCT: 69 %
Platelets: 48 10*3/uL — ABNORMAL LOW (ref 150–400)
RBC: 2.69 MIL/uL — AB (ref 3.87–5.11)
RDW: 13.5 % (ref 11.5–15.5)
WBC: 3.1 10*3/uL — AB (ref 4.0–10.5)

## 2016-12-22 LAB — GLUCOSE, CAPILLARY
GLUCOSE-CAPILLARY: 304 mg/dL — AB (ref 65–99)
GLUCOSE-CAPILLARY: 288 mg/dL — AB (ref 65–99)
GLUCOSE-CAPILLARY: 306 mg/dL — AB (ref 65–99)
Glucose-Capillary: 303 mg/dL — ABNORMAL HIGH (ref 65–99)

## 2016-12-22 MED ORDER — INSULIN DETEMIR 100 UNIT/ML ~~LOC~~ SOLN
15.0000 [IU] | Freq: Every day | SUBCUTANEOUS | Status: DC
  Administered 2016-12-22 – 2016-12-24 (×3): 15 [IU] via SUBCUTANEOUS
  Filled 2016-12-22 (×4): qty 0.15

## 2016-12-22 MED ORDER — INSULIN ASPART 100 UNIT/ML ~~LOC~~ SOLN
5.0000 [IU] | Freq: Once | SUBCUTANEOUS | Status: AC
  Administered 2016-12-22: 5 [IU] via SUBCUTANEOUS

## 2016-12-22 MED ORDER — PANTOPRAZOLE SODIUM 40 MG PO TBEC
40.0000 mg | DELAYED_RELEASE_TABLET | Freq: Two times a day (BID) | ORAL | Status: DC
  Administered 2016-12-22 – 2016-12-24 (×4): 40 mg via ORAL
  Filled 2016-12-22 (×4): qty 1

## 2016-12-22 MED ORDER — PROPRANOLOL HCL 20 MG PO TABS
20.0000 mg | ORAL_TABLET | Freq: Two times a day (BID) | ORAL | Status: DC
  Administered 2016-12-22 – 2016-12-24 (×4): 20 mg via ORAL
  Filled 2016-12-22 (×4): qty 1

## 2016-12-22 MED ORDER — LIVING WELL WITH DIABETES BOOK
Freq: Once | Status: AC
  Administered 2016-12-22: 1
  Filled 2016-12-22: qty 1

## 2016-12-22 NOTE — Progress Notes (Addendum)
Inpatient Diabetes Program Recommendations  AACE/ADA: New Consensus Statement on Inpatient Glycemic Control (2015)  Target Ranges:  Prepandial:   less than 140 mg/dL      Peak postprandial:   less than 180 mg/dL (1-2 hours)      Critically ill patients:  140 - 180 mg/dL  Results for CHIFFON, KITTLESON (MRN [id number]) as of 12/22/2016 08:28  Ref. Range 12/21/2016 07:24 12/21/2016 11:15 12/21/2016 16:06 12/21/2016 21:29 12/22/2016 07:24  Glucose-Capillary Latest Ref Range: 65 - 99 mg/dL 261 (H)  Novolog 8 units 270 (H)  Novolog 8 units 293 (H)  Novolog 8 units 261 (H)  Novolog 8 units 303 (H)  Novolog 11 units    Review of Glycemic Control  Diabetes history: DM2 Outpatient Diabetes medications: Metformin 1000 mg BID, Victoza 1.8 mg daily, Glipizide XL 10 mg daily Current orders for Inpatient glycemic control: Novolog 0-15 units Q4H  Inpatient Diabetes Program Recommendations: Insulin - Basal: Over the past 24 hours, glucose has ranged from 261-303 mg/dl and patient has received a total of Novolog 43 units for correction.  Please consider ordering Levemir 17 units Q24H (starting now; based on 84 kg x 0.2 units). Insulin-Correction: Please consider ordering Novolog 0-5 units QHS for bedtime correction scale. HgbA1C: A1C 10.9% on 12/19/16 indicating an average glucose of 266 mg/dl over the past 2-3 months. MD may want to consider prescribing insulin at time of discharge to improve glycemic control. If so, please inform patient and nursing staff so patient can be educated on insulin. Patient needs to follow up with PCP.  Thanks, Barnie Alderman, RN, MSN, CDE Diabetes Coordinator Inpatient Diabetes Program [phone] (Team Pager from 8am to 5pm)

## 2016-12-22 NOTE — Progress Notes (Signed)
PROGRESS NOTE    Alisha [name]  ONG:[id number] DOB: 1951/01/05 DOA: 12/19/2016 PCP: Glenda Chroman, MD     Brief Narrative:  66 y/o woman admitted from home on 4/7 due to abdominal pain and hematemesis. GI performed bedside EGD on 4/7 with banding of esophageal varices. Has not required blood transfusions as of 4/10.   Assessment & Plan:   Principal Problem:   Acute diverticulitis Active Problems:   Diabetes mellitus type II, non insulin dependent (HCC)   Hepatic cirrhosis (HCC)   Hematemesis   Hyponatremia   Thrombocytopenia (HCC)   Diverticulitis large intestine   Hematemesis -With esophageal varices on EGD, s/p binding. -No need for transfusion unless Hb <7. -Continue IV PPI and octreotide until 4/11 per GI recs. -Also on rocephin.  Acute Blood Loss Anemia -Due to hematemesis. -No need for transfusion at present.  Acute Diverticulitis -Continue rocephin/flagyl. -Would complete 2 weeks of abx.  NASH. -With thrombocytopenia. -Avoid heparin products, no apparent ascites.  Lung Nodule -F/u with OP imaging.  DM II -Uncontrolled. -Add levemir.   DVT prophylaxis: SCDs Code Status: full code patient only Family Communication: keep in ICU today Disposition Plan: Hope for DC home in 24-48 hours.  Consultants:   GI  Procedures:   EGD  Antimicrobials:  Anti-infectives    Start     Dose/Rate Route Frequency Ordered Stop   12/20/16 0000  metroNIDAZOLE (FLAGYL) IVPB 500 mg     500 mg 100 mL/hr over 60 Minutes Intravenous Every 8 hours 12/19/16 1647     12/20/16 0000  cefTRIAXone (ROCEPHIN) 1 g in dextrose 5 % 50 mL IVPB  Status:  Discontinued     1 g 100 mL/hr over 30 Minutes Intravenous Every 24 hours 12/19/16 1647 12/19/16 1725   12/19/16 1800  cefTRIAXone (ROCEPHIN) 2 g in dextrose 5 % 50 mL IVPB     2 g 100 mL/hr over 30 Minutes Intravenous Every 24 hours 12/19/16 1725     12/19/16 1615  ciprofloxacin (CIPRO) IVPB 400 mg     400 mg 200 mL/hr  over 60 Minutes Intravenous  Once 12/19/16 1614 12/19/16 1729   12/19/16 1615  metroNIDAZOLE (FLAGYL) tablet 500 mg     500 mg Oral  Once 12/19/16 1614 12/19/16 1630       Subjective: Some black marks on toilet paper when wiping today.  Objective: Vitals:   12/22/16 1300 12/22/16 1400 12/22/16 1500 12/22/16 1600  BP: 99/77 122/62 (!) 122/57 (!) 125/54  Pulse:      Resp: (!) 21 (!) 25 (!) 25 (!) 24  Temp:    99 F (37.2 C)  TempSrc:    Oral  SpO2:      Weight:      Height:        Intake/Output Summary (Last 24 hours) at 12/22/16 1633 Last data filed at 12/22/16 1246  Gross per 24 hour  Intake             2590 ml  Output              400 ml  Net             2190 ml   Filed Weights   12/20/16 0934 12/21/16 0500 12/22/16 0500  Weight: 80.7 kg (178 lb) 83.1 kg (183 lb 3.2 oz) 84.9 kg (187 lb 2.7 oz)    Examination:  General exam: Alert, awake, oriented x 3 Respiratory system: Clear to auscultation. Respiratory effort normal. Cardiovascular system:RRR. No  murmurs, rubs, gallops. Gastrointestinal system: Abdomen is nondistended, soft and nontender. No organomegaly or masses felt. Normal bowel sounds heard. Central nervous system: Alert and oriented. No focal neurological deficits. Extremities: No C/C/E, +pedal pulses Skin: No rashes, lesions or ulcers Psychiatry: Judgement and insight appear normal. Mood & affect appropriate.      Data Reviewed: I have personally reviewed following labs and imaging studies  CBC:  Recent Labs Lab 12/20/16 1225 12/20/16 2245 12/21/16 1012 12/22/16 0417 12/22/16 1254  WBC 3.9* 5.3 3.2* 2.9* 3.1*  NEUTROABS  --   --   --   --  2.1  HGB 8.7* 9.1* 9.0* 7.8* 8.1*  HCT 25.2* 26.0* 25.9* 23.4* 23.7*  MCV 88.7 87.8 87.8 90.3 88.1  PLT 45* 67* 49* 47* 48*   Basic Metabolic Panel:  Recent Labs Lab 12/19/16 1307 12/20/16 0426 12/21/16 1012 12/22/16 0417  NA 129* 135 132* 134*  K 5.0 4.5 4.0 4.1  CL 99* 105 104 106  CO2 20*  22 21* 21*  GLUCOSE 457* 135* 284* 304*  BUN 13 19 8  5*  CREATININE 0.81 0.78 0.68 0.64  CALCIUM 9.1 8.3* 8.1* 8.1*   GFR: Estimated Creatinine Clearance: 68.4 mL/min (by C-G formula based on SCr of 0.64 mg/dL). Liver Function Tests:  Recent Labs Lab 12/19/16 1307 12/20/16 0426 12/21/16 1012  AST 26 18 32  ALT 23 17 22   ALKPHOS 73 51 52  BILITOT 3.7* 2.0* 1.4*  PROT 6.9 5.3* 5.7*  ALBUMIN 3.6 2.7* 3.0*    Recent Labs Lab 12/19/16 1307  LIPASE 21   No results for input(s): AMMONIA in the last 168 hours. Coagulation Profile:  Recent Labs Lab 12/19/16 1307 12/20/16 0426 12/21/16 1012  INR 1.26 1.53 1.33   Cardiac Enzymes: No results for input(s): CKTOTAL, CKMB, CKMBINDEX, TROPONINI in the last 168 hours. BNP (last 3 results) No results for input(s): PROBNP in the last 8760 hours. HbA1C: No results for input(s): HGBA1C in the last 72 hours. CBG:  Recent Labs Lab 12/21/16 1606 12/21/16 2129 12/22/16 0724 12/22/16 1112 12/22/16 1611  GLUCAP 293* 261* 303* 304* 288*   Lipid Profile: No results for input(s): CHOL, HDL, LDLCALC, TRIG, CHOLHDL, LDLDIRECT in the last 72 hours. Thyroid Function Tests: No results for input(s): TSH, T4TOTAL, FREET4, T3FREE, THYROIDAB in the last 72 hours. Anemia Panel: No results for input(s): VITAMINB12, FOLATE, FERRITIN, TIBC, IRON, RETICCTPCT in the last 72 hours. Urine analysis:    Component Value Date/Time   COLORURINE YELLOW 12/22/2016 1140   APPEARANCEUR CLEAR 12/22/2016 1140   LABSPEC 1.013 12/22/2016 1140   PHURINE 5.0 12/22/2016 1140   GLUCOSEU >=500 (A) 12/22/2016 1140   HGBUR NEGATIVE 12/22/2016 1140   BILIRUBINUR NEGATIVE 12/22/2016 1140   KETONESUR 20 (A) 12/22/2016 1140   PROTEINUR NEGATIVE 12/22/2016 1140   NITRITE NEGATIVE 12/22/2016 1140   LEUKOCYTESUR NEGATIVE 12/22/2016 1140   Sepsis Labs: @LABRCNTIP (procalcitonin:4,lacticidven:4)  ) Recent Results (from the past 240 hour(s))  MRSA PCR Screening      Status: None   Collection Time: 12/19/16  6:50 PM  Result Value Ref Range Status   MRSA by PCR NEGATIVE NEGATIVE Final    Comment:        The GeneXpert MRSA Assay (FDA approved for NASAL specimens only), is one component of a comprehensive MRSA colonization surveillance program. It is not intended to diagnose MRSA infection nor to guide or monitor treatment for MRSA infections.          Radiology Studies: No results  found.      Scheduled Meds: . cefTRIAXone (ROCEPHIN)  IV  2 g Intravenous Q24H  . insulin aspart  0-15 Units Subcutaneous TID WC  . insulin detemir  15 Units Subcutaneous Daily  . metronidazole  500 mg Intravenous Q8H  . pantoprazole  40 mg Oral BID AC  . propranolol  20 mg Oral BID   Continuous Infusions: . sodium chloride 100 mL/hr at 12/22/16 1100  . octreotide  (SANDOSTATIN)    IV infusion 50 mcg/hr (12/22/16 1100)     LOS: 3 days    Time spent: 25 minutes. Greater than 50% of this time was spent in direct contact with the patient coordinating care.     Lelon Frohlich, MD [facility] Pager [phone]  If 7PM-7AM, please contact night-coverage [email or web address] Password [facility] 12/22/2016, 4:33 PM

## 2016-12-22 NOTE — Progress Notes (Signed)
Subjective: Denies abdominal pain. No N/V. Brown stool this morning per patient. States when wiping, just notes "dark stool" on tissue but not in stool, stating "it's getting better". Tolerating full liquids.   Objective: Vital signs in last 24 hours: Temp:  [97.9 F (36.6 C)-98.5 F (36.9 C)] 98.1 F (36.7 C) (04/10 0725) Pulse Rate:  [82-111] 90 (04/10 0000) Resp:  [13-31] 23 (04/10 0725) BP: (102-144)/(49-79) 144/64 (04/10 0500) SpO2:  [95 %-100 %] 97 % (04/10 0000) Weight:  [187 lb 2.7 oz (84.9 kg)] 187 lb 2.7 oz (84.9 kg) (04/10 0500) Last BM Date: 12/21/16 General:   Alert and oriented, pleasant Head:  Normocephalic and atraumatic. Eyes:  No icterus, sclera clear. Conjuctiva pink.  Abdomen:  Bowel sounds present, soft, non-tender, non-distended. No HSM or hernias noted. No rebound or guarding. No masses appreciated  Msk:  Symmetrical without gross deformities. Normal posture. Extremities:  Without  edema. Neurologic:  Alert and  oriented x4 Psych:  Alert and cooperative. Normal mood and affect.  Intake/Output from previous day: 04/09 0701 - 04/10 0700 In: 2675 [P.O.:800; I.V.:1625; IV Piggyback:250] Out: -  Intake/Output this shift: No intake/output data recorded.  Lab Results:  Recent Labs  12/20/16 2245 12/21/16 1012 12/22/16 0417  WBC 5.3 3.2* 2.9*  HGB 9.1* 9.0* 7.8*  HCT 26.0* 25.9* 23.4*  PLT 67* 49* 47*   BMET  Recent Labs  12/20/16 0426 12/21/16 1012 12/22/16 0417  NA 135 132* 134*  K 4.5 4.0 4.1  CL 105 104 106  CO2 22 21* 21*  GLUCOSE 135* 284* 304*  BUN 19 8 5*  CREATININE 0.78 0.68 0.64  CALCIUM 8.3* 8.1* 8.1*   LFT  Recent Labs  12/19/16 1307 12/20/16 0426 12/21/16 1012  PROT 6.9 5.3* 5.7*  ALBUMIN 3.6 2.7* 3.0*  AST 26 18 32  ALT 23 17 22   ALKPHOS 73 51 52  BILITOT 3.7* 2.0* 1.4*   PT/INR  Recent Labs  12/20/16 0426 12/21/16 1012  LABPROT 18.6* 16.6*  INR 1.53 1.33    Assessment: 66 year old female with  history of NASH cirrhosis, 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 Octreotide to continue through April 11, with need for repeat EGD in 2 weeks as outpatient. On Rocephin, starting 12/19/16. Will need total of 7 days antibiotics. Stool brown this morning, with scant amount of old blood on tissue with wiping. Hgb down to 7.8 this morning but patient asymptomatic. Recheck H/H at 1300.   Also noted to have uncomplicated diverticulitis involving distal transverse and distal descending colon. Flagyl IV TID. Will need outpatient colonoscopy due to atypical presentation. Clinically improving.   Right lung nodule: needs further evaluation to exclude slow growing low-grade neoplasm as outpatient.   Plan: Continue octreotide through tomorrow, 4/11 Continue antibiotics for a total of 7 days, may switch to oral dose (could use Cipro 500 BID) to complete total of 7 days once diet advanced and tolerating Recheck H/H at 1300 Advance to soft diet Will need EGD in 2 weeks as outpatient. As she is now in secondary prophylaxis pathway for variceal bleeding, treatment of choice is serial EGDs. Addition of non-selective beta-blocker can be considered as well in addition to serial EGDs as could potentially decrease risk of rebleeding (ASGE guidelines 2014). To resume this evening. Discussed previously with Dr. Gala Romney on 4/9.    Annitta Needs, ANP-BC [facility]    LOS: 3 days    12/22/2016, 8:01 AM

## 2016-12-22 NOTE — Progress Notes (Signed)
Repeat CBC today at noon with Hgb improved slightly to 8.1

## 2016-12-23 DIAGNOSIS — K922 Gastrointestinal hemorrhage, unspecified: Secondary | ICD-10-CM

## 2016-12-23 DIAGNOSIS — K5732 Diverticulitis of large intestine without perforation or abscess without bleeding: Principal | ICD-10-CM

## 2016-12-23 DIAGNOSIS — K7460 Unspecified cirrhosis of liver: Secondary | ICD-10-CM

## 2016-12-23 LAB — CBC
HCT: 25.6 % — ABNORMAL LOW (ref 36.0–46.0)
Hemoglobin: 8.6 g/dL — ABNORMAL LOW (ref 12.0–15.0)
MCH: 30.2 pg (ref 26.0–34.0)
MCHC: 33.6 g/dL (ref 30.0–36.0)
MCV: 89.8 fL (ref 78.0–100.0)
Platelets: 76 10*3/uL — ABNORMAL LOW (ref 150–400)
RBC: 2.85 MIL/uL — ABNORMAL LOW (ref 3.87–5.11)
RDW: 13.7 % (ref 11.5–15.5)
WBC: 4.2 10*3/uL (ref 4.0–10.5)

## 2016-12-23 LAB — GLUCOSE, CAPILLARY
GLUCOSE-CAPILLARY: 183 mg/dL — AB (ref 65–99)
GLUCOSE-CAPILLARY: 247 mg/dL — AB (ref 65–99)
GLUCOSE-CAPILLARY: 299 mg/dL — AB (ref 65–99)
Glucose-Capillary: 328 mg/dL — ABNORMAL HIGH (ref 65–99)

## 2016-12-23 LAB — BASIC METABOLIC PANEL
Anion gap: 5 (ref 5–15)
CALCIUM: 8.1 mg/dL — AB (ref 8.9–10.3)
CO2: 23 mmol/L (ref 22–32)
Chloride: 109 mmol/L (ref 101–111)
Creatinine, Ser: 0.68 mg/dL (ref 0.44–1.00)
GFR calc Af Amer: >60 mL/min (ref >60)
GLUCOSE: 189 mg/dL — AB (ref 65–99)
Potassium: 3.6 mmol/L (ref 3.5–5.1)
Sodium: 137 mmol/L (ref 135–145)

## 2016-12-23 MED ORDER — INSULIN ASPART 100 UNIT/ML ~~LOC~~ SOLN
5.0000 [IU] | Freq: Once | SUBCUTANEOUS | Status: AC
  Administered 2016-12-23: 5 [IU] via SUBCUTANEOUS

## 2016-12-23 NOTE — Progress Notes (Signed)
PT TRANSFERRING TO ROOM 328 ON TELEMETRY. HR 67 IN NSR. NO GI BLEEDING SINCE BANDING. IV SITES X 2 PATENT. SANDOSTATIN HAS BEEN STOPPED.REPORT CALLED TO TAMEKIA RN ON 300.

## 2016-12-23 NOTE — Progress Notes (Signed)
Inpatient Diabetes Program Recommendations  AACE/ADA: New Consensus Statement on Inpatient Glycemic Control (2015)  Target Ranges:  Prepandial:   less than 140 mg/dL      Peak postprandial:   less than 180 mg/dL (1-2 hours)      Critically ill patients:  140 - 180 mg/dL   Lab Results  Component Value Date   GLUCAP 183 (H) 12/23/2016   HGBA1C 10.9 (H) 12/19/2016    Review of Glycemic Control Results for [name], [name] (MRN [id number]) as of 12/23/2016 11:23  Ref. Range 12/22/2016 07:24 12/22/2016 11:12 12/22/2016 16:11 12/22/2016 21:36 12/23/2016 07:22  Glucose-Capillary Latest Ref Range: 65 - 99 mg/dL 303 (H) 304 (H) 288 (H) 306 (H) 183 (H)    Diabetes history: Type 2 diabetes Outpatient Diabetes medications: Metformin 1000 mg bid, Victoza 1.8 mg daily, Metformin 1000 mg bid Current orders for Inpatient glycemic control:  Novolog moderate tid with meals, Levemir 15 units daily Inpatient Diabetes Program Recommendations:   Spoke with patient regarding home diabetes management. She states that she see's Dr. Woody Seller.  She checks her blood sugars daily and states that they are higher in the mornings usually 156-158 mg/dL.  However later in the day CBG's are in the 120's.  Note that these values do not match A1C.  ? Whether A1C could be inaccurate due to acute blood loss? Discussed by phone with patient and encouraged close follow-up with Dr. Woody Seller.  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  Currently fasting blood sugar improved with Levemir. Patient takes Victoza at home, which is not ordered in the hospital. Offered to order "Living well with Diabetes" booklet however patient states she already has this at home.    Will follow.  Thanks, Alisha Perl, RN, BC-ADM Inpatient Diabetes Coordinator Pager [phone]

## 2016-12-23 NOTE — Care Management Note (Signed)
Case Management Note  Patient Details  Name: Alisha [name] MRN: [id number] Date of Birth: 01-31-1951  Subjective/Objective:  Adm with acute diverticulitis/esophageal varices banding on 12/20/2016. Ind with ADL's, has PCP, still drives to appointments. No DME or home health PTA.                  Action/Plan: Anticipate DC home tomorrow with self care.    Expected Discharge Date:  12/24/16               Expected Discharge Plan:  Home/Self Care  In-House Referral:     Discharge planning Services  CM Consult  Post Acute Care Choice:  NA Choice offered to:  NA  DME Arranged:    DME Agency:     HH Arranged:    HH Agency:     Status of Service:  Completed, signed off  If discussed at [employer] of Stay Meetings, dates discussed:    Additional Comments:  Shishir Krantz, Chauncey Reading, RN 12/23/2016, 10:09 AM

## 2016-12-23 NOTE — Progress Notes (Signed)
Subjective:  Doing better. Two nonbloody BMs. Abdominal pain resolved. Nausea resolved. Tolerating diet.   Objective: Vital signs in last 24 hours: Temp:  [98.1 F (36.7 C)-99.2 F (37.3 C)] 98.2 F (36.8 C) (04/11 0722) Pulse Rate:  [62-70] 70 (04/11 0700) Resp:  [17-27] 20 (04/11 0700) BP: (99-138)/(44-77) 121/60 (04/11 0700) SpO2:  [92 %-96 %] 96 % (04/11 0700) Weight:  [191 lb 9.3 oz (86.9 kg)] 191 lb 9.3 oz (86.9 kg) (04/11 0500) Last BM Date: 12/22/16 General:   Alert,  Well-developed, well-nourished, pleasant and cooperative in NAD Head:  Normocephalic and atraumatic. Eyes:  Sclera clear, no icterus.  Chest: CTA bilaterally without rales, rhonchi, crackles.    Heart:  Regular rate and rhythm; no murmurs, clicks, rubs,  or gallops. Abdomen:  Soft, nontender and nondistended.  Normal bowel sounds, without guarding, and without rebound.   Extremities:  Trace pedal edema bilaterally. Without clubbing, deformity. Neurologic:  Alert and  oriented x4;  grossly normal neurologically. Skin:  Intact without significant lesions or rashes. Psych:  Alert and cooperative. Normal mood and affect.  Intake/Output from previous day: 04/10 0701 - 04/11 0700 In: 4187.1 [P.O.:1560; I.V.:2277.1; IV Piggyback:350] Out: 1800 [Urine:1800] Intake/Output this shift: No intake/output data recorded.  Lab Results: CBC  Recent Labs  12/22/16 0417 12/22/16 1254 12/23/16 0452  WBC 2.9* 3.1* 4.2  HGB 7.8* 8.1* 8.6*  HCT 23.4* 23.7* 25.6*  MCV 90.3 88.1 89.8  PLT 47* 48* 76*   BMET  Recent Labs  12/21/16 1012 12/22/16 0417 12/23/16 0452  NA 132* 134* 137  K 4.0 4.1 3.6  CL 104 106 109  CO2 21* 21* 23  GLUCOSE 284* 304* 189*  BUN 8 5* <5*  CREATININE 0.68 0.64 0.68  CALCIUM 8.1* 8.1* 8.1*   LFTs  Recent Labs  12/21/16 1012  BILITOT 1.4*  ALKPHOS 52  AST 32  ALT 22  PROT 5.7*  ALBUMIN 3.0*   No results for input(s): LIPASE in the last 72 hours. PT/INR  Recent Labs   12/21/16 1012  LABPROT 16.6*  INR 1.33      Imaging Studies: Ct Abdomen Pelvis W Contrast  Result Date: 12/19/2016 CLINICAL DATA:  Left lower quadrant pain for 1 week. EXAM: CT ABDOMEN AND PELVIS WITH CONTRAST TECHNIQUE: Multidetector CT imaging of the abdomen and pelvis was performed using the standard protocol following bolus administration of intravenous contrast. CONTRAST:  130m ISOVUE-300 IOPAMIDOL (ISOVUE-300) INJECTION 61%, 159mISOVUE-300 IOPAMIDOL (ISOVUE-300) INJECTION 61% COMPARISON:  MRI of the abdomen December 04, 2016, CT the abdomen and pelvis September 27, 2009 and PET-CT October 11, 2009 FINDINGS: Lower chest: There is a somewhat nodular opacity with air bronchograms in the right middle lobe. This was evaluated with PET-CT in 2011 and was not FDG avid. However, it is more substantial in appearance today. The most substantial component measures 1.5 by 2.4 cm today versus 0.6 x 2.2 cm in 2011. No new pulmonary nodules or masses. 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 Contrast is seen in the distal esophagus consistent with reflux. The lung bases are otherwise normal. Hepatobiliary: The liver demonstrates a nodular contour, particularly in the left hepatic lobe consistent with cirrhosis. The 8 mm lesion seen in segment 4 a is seen on series 2, image 13 today, better assessed on the recent MRI. It have not obviously grown in the interval. No other liver masses are seen. Hepatic steatosis is noted. The portal vein remains patent. There is re- cannulization  of the umbilical vein. Cholelithiasis is seen without wall thickening or pericholecystic fluid. No intra or extrahepatic biliary duct dilatation. Pancreas: The cystic lesions in the pancreas described on the recent MRI are better assessed on that study. The largest in the pancreatic neck is similar since 2011. No suspicious solid masses identified. Spleen: The spleen demonstrates heterogeneous attenuation.  No focal masses are seen. The spleen measures 15 cm in cranial caudal dimension which is stable. Adrenals/Urinary Tract: The adrenal glands are normal. The kidneys, ureters, and bladder are unremarkable as well. Stomach/Bowel: The stomach and small bowel are normal. Colonic diverticulosis is identified. There is inflammation around a colonic diverticulum in the distal descending colon best seen on coronal image 53 consistent with diverticulitis. No evidence of perforation or complication. There is also a mildly inflamed diverticulum in the distal transverse colon without perforation or complication. Remainder of the colon is unremarkable. The appendix is normal. Vascular/Lymphatic: Atherosclerotic changes are seen in the non aneurysmal aorta. Worsening varices in the gastrohepatic ligament, anterior left abdominal wall on image 55, and paraesophageal region. Re- cannulization of the umbilical vein. Collateral vessels in the abdominal wall. No suspicious adenopathy. Reproductive: Status post hysterectomy. No adnexal masses. Other: No abdominal wall hernia or abnormality. No abdominopelvic ascites. Musculoskeletal: No acute or significant osseous findings. IMPRESSION: 1. Two sites of diverticulitis involving the distal transverse colon and the distal descending colon without perforation or complication. This likely explains the patient's symptoms. 2. An irregular nodular opacity in the right lung base was thought to be scarring in 2011. It is more nodular and substantial in appearance today. Despite the negative PET-CT in 2011, further evaluation is recommended to exclude a slow growing low-grade neoplasm. Further evaluation could be performed with a follow-up PET-CT or tissue diagnosis. Consider pulmonary consultation. 3. Cirrhosis. Worsening varices including in the paraesophageal region suggesting worsening portal venous hypertension. 4. The known liver lesion in segment 4A was better assessed on recent MRI. 5.  Known pancreatic cystic lesions were better assessed on recent MRI. 6. Atherosclerosis. Electronically Signed   By: Dorise Bullion III M.D   On: 12/19/2016 16:03   Mr 3d Recon At Scanner  Result Date: 12/05/2016 CLINICAL DATA:  Cirrhosis. Dilated common bile duct on recent abdominal sonogram. EXAM: MRI ABDOMEN WITHOUT AND WITH CONTRAST (INCLUDING MRCP) TECHNIQUE: Multiplanar multisequence MR imaging of the abdomen was performed both before and after the administration of intravenous contrast. Heavily T2-weighted images of the biliary and pancreatic ducts were obtained, and three-dimensional MRCP images were rendered by post processing. CONTRAST:  8 cc Eovist IV. COMPARISON:  11/12/2016 abdominal sonogram. 09/27/2009 CT abdomen/ pelvis. FINDINGS: Motion degraded scan. Lower chest: Curvilinear opacity in the right middle lobe is not well evaluated by MRI (series 22/ image 62) and may be grossly stable since 09/27/2009 CT, suggesting scarring. Otherwise grossly clear lung bases. Large lower esophageal varices. Hepatobiliary: The liver surface is diffusely irregular and there is mild relative hypertrophy of the lateral segment left liver lobe, compatible with cirrhosis. No significant hepatic steatosis. There is a 0.8 x 0.8 cm segment 4A left liver lobe lesion (series 24/ image 26), which demonstrates T2 hyperintensity, T1 hypointensity, no arterial phase enhancement and questionable hypoenhancement on the motion degraded portal venous phase sequences, and appears new since 09/27/2009, compatible with a LI-RADS category 3 lesion. No additional liver lesions. Numerous subcentimeter gallstones are layering in the nondistended gallbladder, with no gallbladder wall thickening or pericholecystic fluid. No biliary ductal dilatation. Common bile duct diameter 5 mm.  No choledocholithiasis. No biliary strictures. No biliary or ampullary mass. Pancreas: There is a unilocular lobulated 1.3 x 0.7 x 1.6 cm pancreatic neck  cystic lesion (series 4/image 30), without wall thickening, internal septations or internal enhancement. There are a few additional unilocular sub 5 mm cystic pancreatic lesions scattered throughout the pancreas on the MRCP sequence without appreciable complexity. No pancreatic duct dilation. No pancreas divisum. Spleen: Mild-to-moderate splenomegaly (craniocaudal splenic length 16.4 cm). No splenic mass. Adrenals/Urinary Tract: Normal adrenals. No hydronephrosis. Normal kidneys with no renal mass. Stomach/Bowel: Grossly normal stomach. Visualized small and large bowel is normal caliber, with no bowel wall thickening. Vascular/Lymphatic: Atherosclerotic nonaneurysmal abdominal aorta. Patent portal, hepatic, splenic and renal veins. Large paraumbilical varix. No pathologically enlarged lymph nodes in the abdomen. Other: No abdominal ascites or focal fluid collection. Musculoskeletal: No aggressive appearing focal osseous lesions. IMPRESSION: 1. Motion degraded scan. Cirrhosis. Segment 4A left liver lobe 0.8 cm LI-RADS category 3 lesion (intermediate probability for hepatocellular carcinoma). Follow-up MRI abdomen without and with IV contrast recommended in 3-6 months. 2. Mild-to-moderate splenomegaly. No ascites. Large lower esophageal and paraumbilical varices. 3. Cholelithiasis. No biliary ductal dilatation. Common bile duct diameter 5 mm. No choledocholithiasis. 4. Scattered cystic pancreatic lesions throughout the pancreas, largest 1.6 cm in the pancreatic neck, without high risk features. These lesions can be reassessed on follow-up MRI without and with IV contrast in 6 months. This recommendation follows ACR consensus guidelines: Management of Incidental Pancreatic Cysts: A White Paper of the ACR Incidental Findings Committee. J Am Coll Radiol 8413;24:401-027. 5. Aortic atherosclerosis . Electronically Signed   By: Ilona Sorrel M.D.   On: 12/05/2016 09:16   Mr Abdomen Mrcp Moise Boring Contast  Result Date:  12/05/2016 CLINICAL DATA:  Cirrhosis. Dilated common bile duct on recent abdominal sonogram. EXAM: MRI ABDOMEN WITHOUT AND WITH CONTRAST (INCLUDING MRCP) TECHNIQUE: Multiplanar multisequence MR imaging of the abdomen was performed both before and after the administration of intravenous contrast. Heavily T2-weighted images of the biliary and pancreatic ducts were obtained, and three-dimensional MRCP images were rendered by post processing. CONTRAST:  8 cc Eovist IV. COMPARISON:  11/12/2016 abdominal sonogram. 09/27/2009 CT abdomen/ pelvis. FINDINGS: Motion degraded scan. Lower chest: Curvilinear opacity in the right middle lobe is not well evaluated by MRI (series 22/ image 62) and may be grossly stable since 09/27/2009 CT, suggesting scarring. Otherwise grossly clear lung bases. Large lower esophageal varices. Hepatobiliary: The liver surface is diffusely irregular and there is mild relative hypertrophy of the lateral segment left liver lobe, compatible with cirrhosis. No significant hepatic steatosis. There is a 0.8 x 0.8 cm segment 4A left liver lobe lesion (series 24/ image 26), which demonstrates T2 hyperintensity, T1 hypointensity, no arterial phase enhancement and questionable hypoenhancement on the motion degraded portal venous phase sequences, and appears new since 09/27/2009, compatible with a LI-RADS category 3 lesion. No additional liver lesions. Numerous subcentimeter gallstones are layering in the nondistended gallbladder, with no gallbladder wall thickening or pericholecystic fluid. No biliary ductal dilatation. Common bile duct diameter 5 mm. No choledocholithiasis. No biliary strictures. No biliary or ampullary mass. Pancreas: There is a unilocular lobulated 1.3 x 0.7 x 1.6 cm pancreatic neck cystic lesion (series 4/image 30), without wall thickening, internal septations or internal enhancement. There are a few additional unilocular sub 5 mm cystic pancreatic lesions scattered throughout the pancreas  on the MRCP sequence without appreciable complexity. No pancreatic duct dilation. No pancreas divisum. Spleen: Mild-to-moderate splenomegaly (craniocaudal splenic length 16.4 cm). No splenic mass.  Adrenals/Urinary Tract: Normal adrenals. No hydronephrosis. Normal kidneys with no renal mass. Stomach/Bowel: Grossly normal stomach. Visualized small and large bowel is normal caliber, with no bowel wall thickening. Vascular/Lymphatic: Atherosclerotic nonaneurysmal abdominal aorta. Patent portal, hepatic, splenic and renal veins. Large paraumbilical varix. No pathologically enlarged lymph nodes in the abdomen. Other: No abdominal ascites or focal fluid collection. Musculoskeletal: No aggressive appearing focal osseous lesions. IMPRESSION: 1. Motion degraded scan. Cirrhosis. Segment 4A left liver lobe 0.8 cm LI-RADS category 3 lesion (intermediate probability for hepatocellular carcinoma). Follow-up MRI abdomen without and with IV contrast recommended in 3-6 months. 2. Mild-to-moderate splenomegaly. No ascites. Large lower esophageal and paraumbilical varices. 3. Cholelithiasis. No biliary ductal dilatation. Common bile duct diameter 5 mm. No choledocholithiasis. 4. Scattered cystic pancreatic lesions throughout the pancreas, largest 1.6 cm in the pancreatic neck, without high risk features. These lesions can be reassessed on follow-up MRI without and with IV contrast in 6 months. This recommendation follows ACR consensus guidelines: Management of Incidental Pancreatic Cysts: A White Paper of the ACR Incidental Findings Committee. J Am Coll Radiol 3790;24:097-353. 5. Aortic atherosclerosis . Electronically Signed   By: Ilona Sorrel M.D.   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 Octreotide to continue through April 11, with need for repeat EGD in 2 weeks as outpatient. On  Rocephin, starting 12/19/16. Will need total of 7 days antibiotics.  No evidence of ongoing bleeding. Propranolol resumed yesterday with better control of pulse.  Also noted to have uncomplicated diverticulitis involving distal transverse and distal descending colon. Flagyl IV TID. Will need outpatient colonoscopy due to atypicalpresentation. Clinically improving.   Right lung nodule: needs further evaluation to exclude slow growing low-grade neoplasm as outpatient.    Plan: 1. Octreotide completed today.  2. Complete 7 days of abx for for GI bleeding/prophylaxis for SBP. Can transition to cipro bid as outpatient.  3. Complete antibiotics for diverticulitis.  4. EGD in 2 weeks as outpatient.  5. Consider outpatient colonoscopy due to atypical presentation of diverticulitis.  6. Outpatient work up of lung nodule, consider PET-CT vs pulmonology consultation.  7. f/u AFP tumor marker.  Laureen Ochs. Bernarda Caffey [facility] [phone] 4/11/20188:36 AM     LOS: 4 days

## 2016-12-23 NOTE — Care Management Important Message (Signed)
Important Message  Patient Details  Name: Alisha [name] MRN: [id number] Date of Birth: 27-Jul-1951   Medicare Important Message Given:  Yes    Aidee Latimore, Chauncey Reading, RN 12/23/2016, 10:14 AM

## 2016-12-23 NOTE — Progress Notes (Signed)
Noted. Alisha [name], patient ended up in the hospital and I am getting the AFP. LFTs have been done.  Please cancel the AFP/LFT order by RMR.  I deleted previous NIC for u/s. She is NIC for MRCP.

## 2016-12-23 NOTE — Progress Notes (Signed)
PROGRESS NOTE    Alisha [name]  FOY:[id number] DOB: Oct 15, 1950 DOA: 12/19/2016 PCP: Glenda Chroman, MD    Brief Narrative:  Patient with NASH, esophageal Varices, DM, diverticulosis, admitted for UGIB and diverticulitis.  She was banded and placed on IV Octreotide.  GI saw her and is following her. No further bleeding.    Assessment & Plan:   Principal Problem:   Acute diverticulitis Active Problems:   Diabetes mellitus type II, non insulin dependent (HCC)   Hepatic cirrhosis (HCC)   Hematemesis   Hyponatremia   Thrombocytopenia (HCC)   Diverticulitis large intestine  Hematemesis -With esophageal varices on EGD, s/p binding. -No need for transfusion unless Hb <7. -Now on oral PPI. -Also on rocephin.  Acute Blood Loss Anemia -Due to hematemesis. -No need for transfusion at present.  Acute Diverticulitis -Continue rocephin/flagyl. -Would complete 2 weeks of abx.  NASH. -With thrombocytopenia. -Avoid heparin products, no apparent ascites.  Lung Nodule -F/u with OP imaging.  DM II -Uncontrolled. -Add levemir.   DVT prophylaxis: SCD Code Status: FULL CODE.  Family Communication: None.  Disposition Plan:  Home tomorrow.   Consultants:   GI.  Procedures:   EGD with banding.   Antimicrobials: Anti-infectives    Start     Dose/Rate Route Frequency Ordered Stop   12/20/16 0000  metroNIDAZOLE (FLAGYL) IVPB 500 mg     500 mg 100 mL/hr over 60 Minutes Intravenous Every 8 hours 12/19/16 1647     12/20/16 0000  cefTRIAXone (ROCEPHIN) 1 g in dextrose 5 % 50 mL IVPB  Status:  Discontinued     1 g 100 mL/hr over 30 Minutes Intravenous Every 24 hours 12/19/16 1647 12/19/16 1725   12/19/16 1800  cefTRIAXone (ROCEPHIN) 2 g in dextrose 5 % 50 mL IVPB     2 g 100 mL/hr over 30 Minutes Intravenous Every 24 hours 12/19/16 1725     12/19/16 1615  ciprofloxacin (CIPRO) IVPB 400 mg     400 mg 200 mL/hr over 60 Minutes Intravenous  Once 12/19/16 1614 12/19/16  1729   12/19/16 1615  metroNIDAZOLE (FLAGYL) tablet 500 mg     500 mg Oral  Once 12/19/16 1614 12/19/16 1630       Subjective: Feeling well.  No further bleeding reported.    Objective: Vitals:   12/23/16 0500 12/23/16 0600 12/23/16 0700 12/23/16 0722  BP: (!) 113/49 (!) 106/50 121/60   Pulse: 66 62 70   Resp: (!) 25 (!) 23 20   Temp:    98.2 F (36.8 C)  TempSrc:    Oral  SpO2: 95% 92% 96%   Weight: 86.9 kg (191 lb 9.3 oz)     Height:        Intake/Output Summary (Last 24 hours) at 12/23/16 0750 Last data filed at 12/23/16 0700  Gross per 24 hour  Intake          4087.09 ml  Output             1800 ml  Net          2287.09 ml   Filed Weights   12/21/16 0500 12/22/16 0500 12/23/16 0500  Weight: 83.1 kg (183 lb 3.2 oz) 84.9 kg (187 lb 2.7 oz) 86.9 kg (191 lb 9.3 oz)    Examination:  General exam: Appears calm and comfortable  Respiratory system: Clear to auscultation. Respiratory effort normal. Cardiovascular system: S1 & S2 heard, RRR. No JVD, murmurs, rubs, gallops or clicks. No pedal edema.  Gastrointestinal system: Abdomen is nondistended, soft and nontender. No organomegaly or masses felt. Normal bowel sounds heard. Central nervous system: Alert and oriented. No focal neurological deficits. Extremities: Symmetric 5 x 5 power. Skin: No rashes, lesions or ulcers Psychiatry: Judgement and insight appear normal. Mood & affect appropriate.   Data Reviewed: I have personally reviewed following labs and imaging studies  CBC:  Recent Labs Lab 12/20/16 2245 12/21/16 1012 12/22/16 0417 12/22/16 1254 12/23/16 0452  WBC 5.3 3.2* 2.9* 3.1* 4.2  NEUTROABS  --   --   --  2.1  --   HGB 9.1* 9.0* 7.8* 8.1* 8.6*  HCT 26.0* 25.9* 23.4* 23.7* 25.6*  MCV 87.8 87.8 90.3 88.1 89.8  PLT 67* 49* 47* 48* 76*   Basic Metabolic Panel:  Recent Labs Lab 12/19/16 1307 12/20/16 0426 12/21/16 1012 12/22/16 0417 12/23/16 0452  NA 129* 135 132* 134* 137  K 5.0 4.5 4.0 4.1  3.6  CL 99* 105 104 106 109  CO2 20* 22 21* 21* 23  GLUCOSE 457* 135* 284* 304* 189*  BUN 13 19 8  5* <5*  CREATININE 0.81 0.78 0.68 0.64 0.68  CALCIUM 9.1 8.3* 8.1* 8.1* 8.1*   GFR: Estimated Creatinine Clearance: 69.2 mL/min (by C-G formula based on SCr of 0.68 mg/dL). Liver Function Tests:  Recent Labs Lab 12/19/16 1307 12/20/16 0426 12/21/16 1012  AST 26 18 32  ALT 23 17 22   ALKPHOS 73 51 52  BILITOT 3.7* 2.0* 1.4*  PROT 6.9 5.3* 5.7*  ALBUMIN 3.6 2.7* 3.0*    Recent Labs Lab 12/19/16 1307  LIPASE 21   Coagulation Profile:  Recent Labs Lab 12/19/16 1307 12/20/16 0426 12/21/16 1012  INR 1.26 1.53 1.33   CBG:  Recent Labs Lab 12/22/16 0724 12/22/16 1112 12/22/16 1611 12/22/16 2136 12/23/16 0722  GLUCAP 303* 304* 288* 306* 183*   Lipid Profile:   Recent Results (from the past 240 hour(s))  MRSA PCR Screening     Status: None   Collection Time: 12/19/16  6:50 PM  Result Value Ref Range Status   MRSA by PCR NEGATIVE NEGATIVE Final    Comment:        The GeneXpert MRSA Assay (FDA approved for NASAL specimens only), is one component of a comprehensive MRSA colonization surveillance program. It is not intended to diagnose MRSA infection nor to guide or monitor treatment for MRSA infections.      Radiology Studies: No results found.  Scheduled Meds: . cefTRIAXone (ROCEPHIN)  IV  2 g Intravenous Q24H  . insulin aspart  0-15 Units Subcutaneous TID WC  . insulin detemir  15 Units Subcutaneous Daily  . metronidazole  500 mg Intravenous Q8H  . pantoprazole  40 mg Oral BID AC  . propranolol  20 mg Oral BID   Continuous Infusions: . sodium chloride 100 mL/hr at 12/23/16 0700     LOS: 4 days   Tsutomu Barfoot, MD FACP Hospitalist.   If 7PM-7AM, please contact night-coverage [email or web address] Password [email or web address] 12/23/2016, 7:50 AM

## 2016-12-24 ENCOUNTER — Telehealth: Payer: Self-pay | Admitting: Gastroenterology

## 2016-12-24 LAB — CBC
HEMATOCRIT: 24.1 % — AB (ref 36.0–46.0)
HEMOGLOBIN: 8.1 g/dL — AB (ref 12.0–15.0)
MCH: 30.5 pg (ref 26.0–34.0)
MCHC: 33.6 g/dL (ref 30.0–36.0)
MCV: 90.6 fL (ref 78.0–100.0)
Platelets: 62 10*3/uL — ABNORMAL LOW (ref 150–400)
RBC: 2.66 MIL/uL — ABNORMAL LOW (ref 3.87–5.11)
RDW: 14.1 % (ref 11.5–15.5)
WBC: 3.1 10*3/uL — ABNORMAL LOW (ref 4.0–10.5)

## 2016-12-24 LAB — GLUCOSE, CAPILLARY
Glucose-Capillary: 227 mg/dL — ABNORMAL HIGH (ref 65–99)
Glucose-Capillary: 295 mg/dL — ABNORMAL HIGH (ref 65–99)

## 2016-12-24 LAB — AFP TUMOR MARKER: AFP-Tumor Marker: 4.3 ng/mL (ref 0.0–8.3)

## 2016-12-24 MED ORDER — METRONIDAZOLE 500 MG PO TABS: 500.0000 mg | ORAL_TABLET | Freq: Three times a day (TID) | ORAL | Status: DC

## 2016-12-24 MED ORDER — CIPROFLOXACIN HCL 500 MG PO TABS: 500.0000 mg | ORAL_TABLET | Freq: Two times a day (BID) | ORAL | 0 refills | Status: DC

## 2016-12-24 MED ORDER — CIPROFLOXACIN HCL 250 MG PO TABS
500.0000 mg | ORAL_TABLET | Freq: Two times a day (BID) | ORAL | Status: DC
  Administered 2016-12-24: 500 mg via ORAL
  Filled 2016-12-24: qty 2

## 2016-12-24 MED ORDER — PANTOPRAZOLE SODIUM 40 MG PO TBEC: 40.0000 mg | DELAYED_RELEASE_TABLET | Freq: Two times a day (BID) | ORAL | 1 refills | Status: AC

## 2016-12-24 MED ORDER — METRONIDAZOLE 500 MG PO TABS: 500.0000 mg | ORAL_TABLET | Freq: Three times a day (TID) | ORAL | 0 refills | Status: DC

## 2016-12-24 NOTE — Discharge Summary (Signed)
Physician Discharge Summary  Alisha [name] PFX:[id number] DOB: 02-07-1951 DOA: 12/19/2016  PCP: Glenda Chroman, MD  Admit date: 12/19/2016 Discharge date: 12/24/2016  Admitted From: Home.  Disposition:  Home.   Recommendations for Outpatient Follow-up:  1. Follow up with PCP in 1-2 weeks 2. Follow up with Dr Sydell Axon in 2-3 weeks for repeat EGD and Colonoscopy.   Home Health: None.  Equipment/Devices: None.  Discharge Condition: Hb stable at 8 g per dL.  No further clinical evidence of bleeding.  Tolerate diet OK.  CODE STATUS: FULL CODE.  Diet recommendation: Heart healthy and carb modified diet.   Brief/Interim Summary:  Patient was admitted by Dr Myna Hidalgo on April 7 for UGIB.   As per his prior H and P:  " HPI: Alisha [name] is a 66 y.o. female with medical history significant for nonalcoholic liver cirrhosis with varices, type 2 diabetes mellitus, and chronic anemia who presents to the emergency department for evaluation of lower abdominal pain, lower back pain, and one episode of hematemesis. Patient reports that she had been in her usual state of health until several days ago when she noted the insidious development of achy pain in the left lower quadrant of her abdomen. Pain was described as moderate in intensity, achy in character, localized to the left lower quadrant, waxing and waning, without alleviating or exacerbating factors identified, and similar to her prior experiences with diverticulitis. Over the ensuing days, the pain began to migrate to the bilateral lower back and was accompanied by some nausea. She vomited once on the day of her presentation and describes a large volume of vomitus with some dark blood in it. She denies any chest pain or palpitations, denies dyspnea or cough, and denies any lightheadedness, headache, change in vision or hearing, or focal numbness or weakness. Patient denies any history of hematemesis prior to this and denies any melena or hematochezia.  Patient denies any use of NSAIDs or alcohol. She is not anticoagulated.  ED Course: Upon arrival to the ED, patient is found to be afebrile, saturating well on room air, blood pressure 104/52, and normal heart rate or respirations. Chemistry panel is notable for a hyponatremia to 129 and a serum glucose of 457. Total bilirubin is elevated to 3.7, continuing to increase from priors. CBC is notable for a thrombocytopenia with platelet count 213,000, improved from priors. INR is 1.26 . CT of the abdomen and pelvis demonstrates 2 areas of acute diverticulitis; one in the distal transverse colon and one in the distal descending colon without perforation or complication. CT also reveals findings consistent with cirrhosis and worsening paraesophageal varices. Lung nodule is noted in the right lung base with radiology advising a pulmonary consultation. Type and screen was performed, 1 L of normal saline was given, patient was treated with ciprofloxacin and Flagyl, and symptomatic care was provided with morphine and Zofran. 20 mg IV Pepcid was administered and 40 mg IV Protonix also given. ED physician consulted with gastroenterology and it was advised that the patient be admitted by medicine with octreotide infusion and twice daily IV Protonix. BP remained soft but stable in the ED and the patient has not been been in any respiratory distress. She'll be admitted to the telemetry unit for ongoing evaluation and management of acute diverticulitis with an episode of blood in her vomitus in the setting of cirrhosis with known esophageal varices.   HOSPITAL COURSE:  Patient was admitted into the ICU, and She was started on Octreotide, along  with IV PPI, and her CBC's were monitor.  She was also started on IV Rocephin and IV Flagyl for concomitant diverticulitis, though she never did have abdominal pain.  She did not require transfusions, as her Hb was low, but remained stable at around 8 grams per dL.  She was seen in  consultation with GI, and EGD was performed on April 8, by Dr Oneida Alar, showing non bleeding grade II varices, and evidence of a column of Grade III varices.  These were banded and 7 was placed successfully.   No evidence of bleeding post banding.  She has hypertensive gastropathy, and her Duodenum was normal.  She was started on food, and did well.  She was kept in the ICU, and her H and H were monitored.  She remained stable and was transferred to the telemeter floor.  Her H and H remained stable, and she is anxious to go home, and is stable for discharge.  Will discharge her on another 10 days of Cipro and Flagyl (no alcohol), and she will follow up with her PCP next week, and with Dr Sydell Axon in 2-3 weeks, as he planned to repeat EGD and colonoscopy about that time as well.  Her DM was regulated, and her electrolytes corrected.  Of note, she has a lung nodule, dated back to 2011, with negative PET scan at that time, and it appears larger, and will need follow up outpatient.  She was made aware of this as well.  Thank you for allowing me to participate in her care.  Good Day.   Discharge Diagnoses:  Principal Problem:   Acute diverticulitis Active Problems:   Diabetes mellitus type II, non insulin dependent (HCC)   Hepatic cirrhosis (HCC)   Hematemesis   Hyponatremia   Thrombocytopenia (HCC)   Diverticulitis large intestine   UGI bleed    Discharge Instructions  Discharge Instructions    Diet - low sodium heart healthy    Complete by:  As directed    Discharge instructions    Complete by:  As directed    Take your medication as directed. Please see your PCP next week, and see Dr Felipe Drone in 2-3 weeks as he will need to " Re-look at your stomach, and planned to do the colonoscopy as well." Follow up as directed with your hematologist.  Take and finish your antibiotics.   Increase activity slowly    Complete by:  As directed      Allergies as of 12/24/2016      Reactions   Codeine Nausea And  Vomiting      Medication List    TAKE these medications   ciprofloxacin 500 MG tablet Commonly known as:  CIPRO Take 1 tablet (500 mg total) by mouth 2 (two) times daily.   cyanocobalamin 1000 MCG/ML injection Commonly known as:  (VITAMIN B-12) Inject 1,000 mcg into the muscle every 30 (thirty) days.   GLIPIZIDE XL 10 MG 24 hr tablet Generic drug:  glipiZIDE Take 10 mg by mouth daily.   lisinopril 10 MG tablet Commonly known as:  PRINIVIL,ZESTRIL Take 10 mg by mouth daily.   metFORMIN 1000 MG tablet Commonly known as:  GLUCOPHAGE Take 1,000 mg by mouth 2 (two) times daily with a meal.   metroNIDAZOLE 500 MG tablet Commonly known as:  FLAGYL Take 1 tablet (500 mg total) by mouth every 8 (eight) hours.   NEXIUM 40 MG capsule Generic drug:  esomeprazole Take 40 mg by mouth daily before breakfast.  pantoprazole 40 MG tablet Commonly known as:  PROTONIX Take 1 tablet (40 mg total) by mouth 2 (two) times daily before a meal.   propranolol 10 MG tablet Commonly known as:  INDERAL Take 3 tablets in the morning and 2 in the evening   VICTOZA 18 MG/3ML Sopn Generic drug:  liraglutide Inject 1.8 mg into the skin daily.       Allergies  Allergen Reactions  . Codeine Nausea And Vomiting    Consultations:  GI.    Procedures/Studies: Ct Abdomen Pelvis W Contrast  Result Date: 12/19/2016 CLINICAL DATA:  Left lower quadrant pain for 1 week. EXAM: CT ABDOMEN AND PELVIS WITH CONTRAST TECHNIQUE: Multidetector CT imaging of the abdomen and pelvis was performed using the standard protocol following bolus administration of intravenous contrast. CONTRAST:  151m ISOVUE-300 IOPAMIDOL (ISOVUE-300) INJECTION 61%, 154mISOVUE-300 IOPAMIDOL (ISOVUE-300) INJECTION 61% COMPARISON:  MRI of the abdomen December 04, 2016, CT the abdomen and pelvis September 27, 2009 and PET-CT October 11, 2009 FINDINGS: Lower chest: There is a somewhat nodular opacity with air bronchograms in the right middle  lobe. This was evaluated with PET-CT in 2011 and was not FDG avid. However, it is more substantial in appearance today. The most substantial component measures 1.5 by 2.4 cm today versus 0.6 x 2.2 cm in 2011. No new pulmonary nodules or masses. 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 Contrast is seen in the distal esophagus consistent with reflux. The lung bases are otherwise normal. Hepatobiliary: The liver demonstrates a nodular contour, particularly in the left hepatic lobe consistent with cirrhosis. The 8 mm lesion seen in segment 4 a is seen on series 2, image 13 today, better assessed on the recent MRI. It have not obviously grown in the interval. No other liver masses are seen. Hepatic steatosis is noted. The portal vein remains patent. There is re- cannulization of the umbilical vein. Cholelithiasis is seen without wall thickening or pericholecystic fluid. No intra or extrahepatic biliary duct dilatation. Pancreas: The cystic lesions in the pancreas described on the recent MRI are better assessed on that study. The largest in the pancreatic neck is similar since 2011. No suspicious solid masses identified. Spleen: The spleen demonstrates heterogeneous attenuation. No focal masses are seen. The spleen measures 15 cm in cranial caudal dimension which is stable. Adrenals/Urinary Tract: The adrenal glands are normal. The kidneys, ureters, and bladder are unremarkable as well. Stomach/Bowel: The stomach and small bowel are normal. Colonic diverticulosis is identified. There is inflammation around a colonic diverticulum in the distal descending colon best seen on coronal image 53 consistent with diverticulitis. No evidence of perforation or complication. There is also a mildly inflamed diverticulum in the distal transverse colon without perforation or complication. Remainder of the colon is unremarkable. The appendix is normal. Vascular/Lymphatic: Atherosclerotic changes are  seen in the non aneurysmal aorta. Worsening varices in the gastrohepatic ligament, anterior left abdominal wall on image 55, and paraesophageal region. Re- cannulization of the umbilical vein. Collateral vessels in the abdominal wall. No suspicious adenopathy. Reproductive: Status post hysterectomy. No adnexal masses. Other: No abdominal wall hernia or abnormality. No abdominopelvic ascites. Musculoskeletal: No acute or significant osseous findings. IMPRESSION: 1. Two sites of diverticulitis involving the distal transverse colon and the distal descending colon without perforation or complication. This likely explains the patient's symptoms. 2. An irregular nodular opacity in the right lung base was thought to be scarring in 2011. It is more nodular and substantial in appearance  today. Despite the negative PET-CT in 2011, further evaluation is recommended to exclude a slow growing low-grade neoplasm. Further evaluation could be performed with a follow-up PET-CT or tissue diagnosis. Consider pulmonary consultation. 3. Cirrhosis. Worsening varices including in the paraesophageal region suggesting worsening portal venous hypertension. 4. The known liver lesion in segment 4A was better assessed on recent MRI. 5. Known pancreatic cystic lesions were better assessed on recent MRI. 6. Atherosclerosis. Electronically Signed   By: Dorise Bullion III M.D   On: 12/19/2016 16:03   Mr 3d Recon At Scanner  Result Date: 12/05/2016 CLINICAL DATA:  Cirrhosis. Dilated common bile duct on recent abdominal sonogram. EXAM: MRI ABDOMEN WITHOUT AND WITH CONTRAST (INCLUDING MRCP) TECHNIQUE: Multiplanar multisequence MR imaging of the abdomen was performed both before and after the administration of intravenous contrast. Heavily T2-weighted images of the biliary and pancreatic ducts were obtained, and three-dimensional MRCP images were rendered by post processing. CONTRAST:  8 cc Eovist IV. COMPARISON:  11/12/2016 abdominal sonogram.  09/27/2009 CT abdomen/ pelvis. FINDINGS: Motion degraded scan. Lower chest: Curvilinear opacity in the right middle lobe is not well evaluated by MRI (series 22/ image 62) and may be grossly stable since 09/27/2009 CT, suggesting scarring. Otherwise grossly clear lung bases. Large lower esophageal varices. Hepatobiliary: The liver surface is diffusely irregular and there is mild relative hypertrophy of the lateral segment left liver lobe, compatible with cirrhosis. No significant hepatic steatosis. There is a 0.8 x 0.8 cm segment 4A left liver lobe lesion (series 24/ image 26), which demonstrates T2 hyperintensity, T1 hypointensity, no arterial phase enhancement and questionable hypoenhancement on the motion degraded portal venous phase sequences, and appears new since 09/27/2009, compatible with a LI-RADS category 3 lesion. No additional liver lesions. Numerous subcentimeter gallstones are layering in the nondistended gallbladder, with no gallbladder wall thickening or pericholecystic fluid. No biliary ductal dilatation. Common bile duct diameter 5 mm. No choledocholithiasis. No biliary strictures. No biliary or ampullary mass. Pancreas: There is a unilocular lobulated 1.3 x 0.7 x 1.6 cm pancreatic neck cystic lesion (series 4/image 30), without wall thickening, internal septations or internal enhancement. There are a few additional unilocular sub 5 mm cystic pancreatic lesions scattered throughout the pancreas on the MRCP sequence without appreciable complexity. No pancreatic duct dilation. No pancreas divisum. Spleen: Mild-to-moderate splenomegaly (craniocaudal splenic length 16.4 cm). No splenic mass. Adrenals/Urinary Tract: Normal adrenals. No hydronephrosis. Normal kidneys with no renal mass. Stomach/Bowel: Grossly normal stomach. Visualized small and large bowel is normal caliber, with no bowel wall thickening. Vascular/Lymphatic: Atherosclerotic nonaneurysmal abdominal aorta. Patent portal, hepatic, splenic  and renal veins. Large paraumbilical varix. No pathologically enlarged lymph nodes in the abdomen. Other: No abdominal ascites or focal fluid collection. Musculoskeletal: No aggressive appearing focal osseous lesions. IMPRESSION: 1. Motion degraded scan. Cirrhosis. Segment 4A left liver lobe 0.8 cm LI-RADS category 3 lesion (intermediate probability for hepatocellular carcinoma). Follow-up MRI abdomen without and with IV contrast recommended in 3-6 months. 2. Mild-to-moderate splenomegaly. No ascites. Large lower esophageal and paraumbilical varices. 3. Cholelithiasis. No biliary ductal dilatation. Common bile duct diameter 5 mm. No choledocholithiasis. 4. Scattered cystic pancreatic lesions throughout the pancreas, largest 1.6 cm in the pancreatic neck, without high risk features. These lesions can be reassessed on follow-up MRI without and with IV contrast in 6 months. This recommendation follows ACR consensus guidelines: Management of Incidental Pancreatic Cysts: A White Paper of the ACR Incidental Findings Committee. J Am Coll Radiol 7673;41:[id number]. 5. Aortic atherosclerosis . Electronically Signed   By:  Ilona Sorrel M.D.   On: 12/05/2016 09:16   Mr Abdomen Mrcp Moise Boring Contast  Result Date: 12/05/2016 CLINICAL DATA:  Cirrhosis. Dilated common bile duct on recent abdominal sonogram. EXAM: MRI ABDOMEN WITHOUT AND WITH CONTRAST (INCLUDING MRCP) TECHNIQUE: Multiplanar multisequence MR imaging of the abdomen was performed both before and after the administration of intravenous contrast. Heavily T2-weighted images of the biliary and pancreatic ducts were obtained, and three-dimensional MRCP images were rendered by post processing. CONTRAST:  8 cc Eovist IV. COMPARISON:  11/12/2016 abdominal sonogram. 09/27/2009 CT abdomen/ pelvis. FINDINGS: Motion degraded scan. Lower chest: Curvilinear opacity in the right middle lobe is not well evaluated by MRI (series 22/ image 62) and may be grossly stable since 09/27/2009 CT,  suggesting scarring. Otherwise grossly clear lung bases. Large lower esophageal varices. Hepatobiliary: The liver surface is diffusely irregular and there is mild relative hypertrophy of the lateral segment left liver lobe, compatible with cirrhosis. No significant hepatic steatosis. There is a 0.8 x 0.8 cm segment 4A left liver lobe lesion (series 24/ image 26), which demonstrates T2 hyperintensity, T1 hypointensity, no arterial phase enhancement and questionable hypoenhancement on the motion degraded portal venous phase sequences, and appears new since 09/27/2009, compatible with a LI-RADS category 3 lesion. No additional liver lesions. Numerous subcentimeter gallstones are layering in the nondistended gallbladder, with no gallbladder wall thickening or pericholecystic fluid. No biliary ductal dilatation. Common bile duct diameter 5 mm. No choledocholithiasis. No biliary strictures. No biliary or ampullary mass. Pancreas: There is a unilocular lobulated 1.3 x 0.7 x 1.6 cm pancreatic neck cystic lesion (series 4/image 30), without wall thickening, internal septations or internal enhancement. There are a few additional unilocular sub 5 mm cystic pancreatic lesions scattered throughout the pancreas on the MRCP sequence without appreciable complexity. No pancreatic duct dilation. No pancreas divisum. Spleen: Mild-to-moderate splenomegaly (craniocaudal splenic length 16.4 cm). No splenic mass. Adrenals/Urinary Tract: Normal adrenals. No hydronephrosis. Normal kidneys with no renal mass. Stomach/Bowel: Grossly normal stomach. Visualized small and large bowel is normal caliber, with no bowel wall thickening. Vascular/Lymphatic: Atherosclerotic nonaneurysmal abdominal aorta. Patent portal, hepatic, splenic and renal veins. Large paraumbilical varix. No pathologically enlarged lymph nodes in the abdomen. Other: No abdominal ascites or focal fluid collection. Musculoskeletal: No aggressive appearing focal osseous lesions.  IMPRESSION: 1. Motion degraded scan. Cirrhosis. Segment 4A left liver lobe 0.8 cm LI-RADS category 3 lesion (intermediate probability for hepatocellular carcinoma). Follow-up MRI abdomen without and with IV contrast recommended in 3-6 months. 2. Mild-to-moderate splenomegaly. No ascites. Large lower esophageal and paraumbilical varices. 3. Cholelithiasis. No biliary ductal dilatation. Common bile duct diameter 5 mm. No choledocholithiasis. 4. Scattered cystic pancreatic lesions throughout the pancreas, largest 1.6 cm in the pancreatic neck, without high risk features. These lesions can be reassessed on follow-up MRI without and with IV contrast in 6 months. This recommendation follows ACR consensus guidelines: Management of Incidental Pancreatic Cysts: A White Paper of the ACR Incidental Findings Committee. J Am Coll Radiol 7628;31:[id number]. 5. Aortic atherosclerosis . Electronically Signed   By: Ilona Sorrel M.D.   On: 12/05/2016 09:16       Subjective: Feeling well.    Discharge Exam: Vitals:   12/23/16 2218 12/24/16 0615  BP: 109/88 (!) 123/55  Pulse: 71 66  Resp: 17 18  Temp: 98.4 F (36.9 C) 97.7 F (36.5 C)   Vitals:   12/23/16 1005 12/23/16 1136 12/23/16 2218 12/24/16 0615  BP: (!) 121/59 124/61 109/88 (!) 123/55  Pulse:  78 71  66  Resp: (!) 22 20 17 18   Temp:  98.3 F (36.8 C) 98.4 F (36.9 C) 97.7 F (36.5 C)  TempSrc:  Oral Oral Oral  SpO2:  98% 95% 97%  Weight:    86.2 kg (190 lb 0.6 oz)  Height:        General: Pt is alert, awake, not in acute distress Cardiovascular: RRR, S1/S2 +, no rubs, no gallops Respiratory: CTA bilaterally, no wheezing, no rhonchi Abdominal: Soft, NT, ND, bowel sounds + Extremities: no edema, no cyanosis    The results of significant diagnostics from this hospitalization (including imaging, microbiology, ancillary and laboratory) are listed below for reference.     Microbiology: Recent Results (from the past 240 hour(s))  MRSA PCR  Screening     Status: None   Collection Time: 12/19/16  6:50 PM  Result Value Ref Range Status   MRSA by PCR NEGATIVE NEGATIVE Final    Comment:        The GeneXpert MRSA Assay (FDA approved for NASAL specimens only), is one component of a comprehensive MRSA colonization surveillance program. It is not intended to diagnose MRSA infection nor to guide or monitor treatment for MRSA infections.      Basic Metabolic Panel:  Recent Labs Lab 12/19/16 1307 12/20/16 0426 12/21/16 1012 12/22/16 0417 12/23/16 0452  NA 129* 135 132* 134* 137  K 5.0 4.5 4.0 4.1 3.6  CL 99* 105 104 106 109  CO2 20* 22 21* 21* 23  GLUCOSE 457* 135* 284* 304* 189*  BUN 13 19 8  5* <5*  CREATININE 0.81 0.78 0.68 0.64 0.68  CALCIUM 9.1 8.3* 8.1* 8.1* 8.1*   Liver Function Tests:  Recent Labs Lab 12/19/16 1307 12/20/16 0426 12/21/16 1012  AST 26 18 32  ALT 23 17 22   ALKPHOS 73 51 52  BILITOT 3.7* 2.0* 1.4*  PROT 6.9 5.3* 5.7*  ALBUMIN 3.6 2.7* 3.0*    Recent Labs Lab 12/19/16 1307  LIPASE 21   CBC:  Recent Labs Lab 12/21/16 1012 12/22/16 0417 12/22/16 1254 12/23/16 0452 12/24/16 0512  WBC 3.2* 2.9* 3.1* 4.2 3.1*  NEUTROABS  --   --  2.1  --   --   HGB 9.0* 7.8* 8.1* 8.6* 8.1*  HCT 25.9* 23.4* 23.7* 25.6* 24.1*  MCV 87.8 90.3 88.1 89.8 90.6  PLT 49* 47* 48* 76* 62*   CBG:  Recent Labs Lab 12/23/16 0722 12/23/16 1144 12/23/16 1604 12/23/16 2216 12/24/16 0815  GLUCAP 183* 247* 299* 328* 227*   Urinalysis    Component Value Date/Time   COLORURINE YELLOW 12/22/2016 1140   APPEARANCEUR CLEAR 12/22/2016 1140   LABSPEC 1.013 12/22/2016 1140   PHURINE 5.0 12/22/2016 1140   GLUCOSEU >=500 (A) 12/22/2016 1140   HGBUR NEGATIVE 12/22/2016 1140   BILIRUBINUR NEGATIVE 12/22/2016 1140   KETONESUR 20 (A) 12/22/2016 1140   PROTEINUR NEGATIVE 12/22/2016 1140   NITRITE NEGATIVE 12/22/2016 [place] 12/22/2016 1140   Microbiology Recent Results (from the  past 240 hour(s))  MRSA PCR Screening     Status: None   Collection Time: 12/19/16  6:50 PM  Result Value Ref Range Status   MRSA by PCR NEGATIVE NEGATIVE Final    Comment:        The GeneXpert MRSA Assay (FDA approved for NASAL specimens only), is one component of a comprehensive MRSA colonization surveillance program. It is not intended to diagnose MRSA infection nor to guide or monitor treatment for MRSA infections.  Time coordinating discharge: Over 30 minutes  SIGNED:  Orvan Falconer, MD FACP [facility] 12/24/2016, 10:14 AM   If 7PM-7AM, please contact night-coverage [email or web address] Password [email or web address]

## 2016-12-24 NOTE — Care Management Note (Signed)
Case Management Note  Patient Details  Name: Alisha [name] MRN: [id number] Date of Birth: [birth year]  Expected Discharge Date:  12/24/16               Expected Discharge Plan:  Home/Self Care  In-House Referral:     Discharge planning Services  CM Consult  Post Acute Care Choice:  NA Choice offered to:  NA  Status of Service:  Completed, signed off  If discussed at [place] of Stay Meetings, dates discussed:  12/24/2016   Sherald Barge, RN 12/24/2016, 10:24 AM

## 2016-12-24 NOTE — Telephone Encounter (Signed)
Patient needs EGD/TCS with Dr. Gala Romney in 2-3 weeks per RMR. Dx: esophageal varices, abnormal colon on ct Triage prior to procedures to update med list.

## 2016-12-24 NOTE — Progress Notes (Signed)
IV discontinued, catheter intact. Discharge instructions given on medications,and follow up visits,patient and family verbalized understanding. Prescriptions sent to Pharmacy of choice documented on AVS. Accompanied by staff to an awaiting vehicle.

## 2016-12-28 ENCOUNTER — Other Ambulatory Visit: Payer: Self-pay

## 2016-12-28 DIAGNOSIS — R933 Abnormal findings on diagnostic imaging of other parts of digestive tract: Secondary | ICD-10-CM

## 2016-12-28 DIAGNOSIS — I8500 Esophageal varices without bleeding: Secondary | ICD-10-CM

## 2016-12-28 MED ORDER — PEG 3350-KCL-NA BICARB-NACL 420 G PO SOLR: 4000.0000 mL | ORAL | 0 refills | Status: DC

## 2016-12-28 NOTE — Telephone Encounter (Signed)
Pt is set up for Wednesday 12/30/16 @ 9:30 am. She is aware. Instructions have been faxed to the pharmacy for her to pick up. She is aware to start clear liquids at lunch today.

## 2016-12-28 NOTE — Telephone Encounter (Signed)
She should not be set up any less than two weeks from 12/24/16. 12/30/16 is too soon given that she just had esophageal variceal banding AND being treated for diverticulitis.

## 2016-12-28 NOTE — Telephone Encounter (Signed)
The next appointment will be at the end of May

## 2016-12-28 NOTE — Telephone Encounter (Signed)
Routing to [place] to schedule

## 2016-12-28 NOTE — Telephone Encounter (Signed)
The only new medications she has is Cipro and Flagyl and Protonix (not Nexium now)

## 2016-12-28 NOTE — Telephone Encounter (Signed)
Let's bring her back straight to endoscopy for surveillance EGD just under the 30 day wire since her hospital consultation was done

## 2016-12-28 NOTE — Telephone Encounter (Signed)
Forwarding to Dr. Gala Romney for input. FYI, Alisha [name].

## 2016-12-29 NOTE — Telephone Encounter (Signed)
Dr. Gala Romney, please clarify. Original plan was for EGD/TCS but last entry by you states surveillance EGD.

## 2016-12-29 NOTE — Telephone Encounter (Signed)
Alisha [name], does the patient only need an EGD and not a TCS?

## 2016-12-30 ENCOUNTER — Ambulatory Visit (HOSPITAL_COMMUNITY): Admit: 2016-12-30 | Payer: Medicare Other | Admitting: Internal Medicine

## 2016-12-30 ENCOUNTER — Encounter (HOSPITAL_COMMUNITY): Payer: Self-pay

## 2016-12-30 SURGERY — EGD (ESOPHAGOGASTRODUODENOSCOPY)
Anesthesia: Moderate Sedation

## 2016-12-31 NOTE — Telephone Encounter (Signed)
Yes; lets get EGD and TCS done at same Time in near future

## 2017-01-01 ENCOUNTER — Encounter (HOSPITAL_COMMUNITY): Payer: Medicare Other | Attending: Hematology

## 2017-01-01 VITALS — BP 120/55 | HR 76 | Temp 98.1°F | Resp 18

## 2017-01-01 DIAGNOSIS — Z7984 Long term (current) use of oral hypoglycemic drugs: Secondary | ICD-10-CM | POA: Insufficient documentation

## 2017-01-01 DIAGNOSIS — D509 Iron deficiency anemia, unspecified: Secondary | ICD-10-CM | POA: Insufficient documentation

## 2017-01-01 DIAGNOSIS — K7689 Other specified diseases of liver: Secondary | ICD-10-CM | POA: Insufficient documentation

## 2017-01-01 DIAGNOSIS — Z79899 Other long term (current) drug therapy: Secondary | ICD-10-CM | POA: Insufficient documentation

## 2017-01-01 DIAGNOSIS — D696 Thrombocytopenia, unspecified: Secondary | ICD-10-CM | POA: Insufficient documentation

## 2017-01-01 DIAGNOSIS — K7460 Unspecified cirrhosis of liver: Secondary | ICD-10-CM | POA: Insufficient documentation

## 2017-01-01 DIAGNOSIS — E538 Deficiency of other specified B group vitamins: Secondary | ICD-10-CM

## 2017-01-01 DIAGNOSIS — R161 Splenomegaly, not elsewhere classified: Secondary | ICD-10-CM | POA: Insufficient documentation

## 2017-01-01 DIAGNOSIS — K2270 Barrett's esophagus without dysplasia: Secondary | ICD-10-CM | POA: Insufficient documentation

## 2017-01-01 DIAGNOSIS — K5730 Diverticulosis of large intestine without perforation or abscess without bleeding: Secondary | ICD-10-CM | POA: Insufficient documentation

## 2017-01-01 DIAGNOSIS — E119 Type 2 diabetes mellitus without complications: Secondary | ICD-10-CM | POA: Insufficient documentation

## 2017-01-01 DIAGNOSIS — D689 Coagulation defect, unspecified: Secondary | ICD-10-CM | POA: Insufficient documentation

## 2017-01-01 MED ORDER — CYANOCOBALAMIN 1000 MCG/ML IJ SOLN
INTRAMUSCULAR | Status: AC
Start: 2017-01-01 — End: 2017-01-01
  Filled 2017-01-01: qty 1

## 2017-01-01 MED ORDER — CYANOCOBALAMIN 1000 MCG/ML IJ SOLN
1000.0000 ug | Freq: Once | INTRAMUSCULAR | Status: AC
  Administered 2017-01-01: 1000 ug via INTRAMUSCULAR

## 2017-01-01 NOTE — Telephone Encounter (Signed)
Routing to [place] to assist with scheduling.

## 2017-01-01 NOTE — Progress Notes (Signed)
Alisha [name] presents today for injection per MD orders. B12 1037mg administered IM in right Upper Arm. Administration without incident. Patient tolerated well.

## 2017-01-01 NOTE — Telephone Encounter (Signed)
See RMR input. Wants double. For esophageal varices, abnormal colon on ct. Triage prior to procedures.  Consult note dated 12/19/16 and has to be done within 3 -4 weeks from that date per rmr.

## 2017-01-04 ENCOUNTER — Other Ambulatory Visit: Payer: Self-pay

## 2017-01-04 DIAGNOSIS — R933 Abnormal findings on diagnostic imaging of other parts of digestive tract: Secondary | ICD-10-CM

## 2017-01-04 DIAGNOSIS — I8500 Esophageal varices without bleeding: Secondary | ICD-10-CM

## 2017-01-04 NOTE — Telephone Encounter (Signed)
Called pt. TCS/EGD with RMR scheduled for 01/07/17 at 3:15pm. She already has Tr-Lyte. She has previous instructions for prep. Reviewed new instructions/times with pt. Reviewed meds. She completed Cipro yesterday and will finish Flagyl today. No new medications. Advised pt I would call her back with diabetic medication adjustments.  Magda Paganini, how do you want to adjust diabetic medications?

## 2017-01-04 NOTE — Telephone Encounter (Signed)
Alisha [name], please put the patient on the schedule for Thursday or Friday this week.

## 2017-01-04 NOTE — Telephone Encounter (Signed)
PA info for TCS/EGD submitted via [facility] website. No PA needed. Decision ID# [id number].

## 2017-01-05 NOTE — Telephone Encounter (Signed)
Noted. Discussed with Dr. Gala Romney. Patient recently treated for diverticulitis. If doing well without significant abd pain, then ok for EGD/TCS this as scheduled.

## 2017-01-05 NOTE — Telephone Encounter (Signed)
Called and informed pt about diabetic meds. She is doing better from diverticulitis, pain is better.

## 2017-01-05 NOTE — Telephone Encounter (Signed)
Routing back to [facility] for diabetic med adjustment.

## 2017-01-05 NOTE — Telephone Encounter (Signed)
Day before procedure: glipizide 1/2 tablet once, metfomrin 518m bid, victoza 0.670minj daily. AM of procedure: hold diabetic meds.

## 2017-01-07 ENCOUNTER — Encounter (HOSPITAL_COMMUNITY)
Admission: RE | Disposition: A | Discharge status: Home / Self Care | Payer: Self-pay | Source: Ambulatory Visit | Attending: Internal Medicine

## 2017-01-07 ENCOUNTER — Encounter (HOSPITAL_COMMUNITY): Payer: Self-pay | Admitting: *Deleted

## 2017-01-07 ENCOUNTER — Ambulatory Visit (HOSPITAL_COMMUNITY)
Admission: RE | Admit: 2017-01-07 | Discharge: 2017-01-07 | Disposition: A | Discharge status: Home / Self Care | Payer: Medicare Other | Source: Ambulatory Visit | Attending: Internal Medicine | Admitting: Internal Medicine

## 2017-01-07 DIAGNOSIS — K449 Diaphragmatic hernia without obstruction or gangrene: Secondary | ICD-10-CM | POA: Diagnosis not present

## 2017-01-07 DIAGNOSIS — Z7984 Long term (current) use of oral hypoglycemic drugs: Secondary | ICD-10-CM | POA: Diagnosis not present

## 2017-01-07 DIAGNOSIS — D120 Benign neoplasm of cecum: Secondary | ICD-10-CM | POA: Diagnosis not present

## 2017-01-07 DIAGNOSIS — E119 Type 2 diabetes mellitus without complications: Secondary | ICD-10-CM | POA: Insufficient documentation

## 2017-01-07 DIAGNOSIS — K3189 Other diseases of stomach and duodenum: Secondary | ICD-10-CM | POA: Insufficient documentation

## 2017-01-07 DIAGNOSIS — K7460 Unspecified cirrhosis of liver: Secondary | ICD-10-CM | POA: Diagnosis not present

## 2017-01-07 DIAGNOSIS — K766 Portal hypertension: Secondary | ICD-10-CM | POA: Diagnosis not present

## 2017-01-07 DIAGNOSIS — I8500 Esophageal varices without bleeding: Secondary | ICD-10-CM | POA: Insufficient documentation

## 2017-01-07 DIAGNOSIS — D126 Benign neoplasm of colon, unspecified: Secondary | ICD-10-CM

## 2017-01-07 DIAGNOSIS — R933 Abnormal findings on diagnostic imaging of other parts of digestive tract: Secondary | ICD-10-CM | POA: Insufficient documentation

## 2017-01-07 DIAGNOSIS — K633 Ulcer of intestine: Secondary | ICD-10-CM | POA: Diagnosis not present

## 2017-01-07 DIAGNOSIS — E785 Hyperlipidemia, unspecified: Secondary | ICD-10-CM | POA: Insufficient documentation

## 2017-01-07 DIAGNOSIS — Z79899 Other long term (current) drug therapy: Secondary | ICD-10-CM | POA: Diagnosis not present

## 2017-01-07 DIAGNOSIS — K5790 Diverticulosis of intestine, part unspecified, without perforation or abscess without bleeding: Secondary | ICD-10-CM | POA: Insufficient documentation

## 2017-01-07 DIAGNOSIS — K643 Fourth degree hemorrhoids: Secondary | ICD-10-CM | POA: Diagnosis not present

## 2017-01-07 DIAGNOSIS — I10 Essential (primary) hypertension: Secondary | ICD-10-CM | POA: Diagnosis not present

## 2017-01-07 DIAGNOSIS — Z90710 Acquired absence of both cervix and uterus: Secondary | ICD-10-CM | POA: Insufficient documentation

## 2017-01-07 DIAGNOSIS — E538 Deficiency of other specified B group vitamins: Secondary | ICD-10-CM | POA: Insufficient documentation

## 2017-01-07 DIAGNOSIS — K228 Other specified diseases of esophagus: Secondary | ICD-10-CM

## 2017-01-07 DIAGNOSIS — K219 Gastro-esophageal reflux disease without esophagitis: Secondary | ICD-10-CM | POA: Diagnosis not present

## 2017-01-07 HISTORY — PX: ESOPHAGOGASTRODUODENOSCOPY: SHX5428

## 2017-01-07 HISTORY — PX: COLONOSCOPY: SHX5424

## 2017-01-07 LAB — GLUCOSE, CAPILLARY: Glucose-Capillary: 197 mg/dL — ABNORMAL HIGH (ref 65–99)

## 2017-01-07 SURGERY — COLONOSCOPY
Anesthesia: Moderate Sedation

## 2017-01-07 MED ORDER — LIDOCAINE VISCOUS 2 % MT SOLN
OROMUCOSAL | Status: DC | PRN
  Administered 2017-01-07: 3 mL via OROMUCOSAL

## 2017-01-07 MED ORDER — MIDAZOLAM HCL 5 MG/5ML IJ SOLN
INTRAMUSCULAR | Status: DC | PRN
  Administered 2017-01-07: 1 mg via INTRAVENOUS
  Administered 2017-01-07: 2 mg via INTRAVENOUS
  Administered 2017-01-07: 1 mg via INTRAVENOUS
  Administered 2017-01-07: 2 mg via INTRAVENOUS

## 2017-01-07 MED ORDER — ONDANSETRON HCL 4 MG/2ML IJ SOLN
INTRAMUSCULAR | Status: DC | PRN
  Administered 2017-01-07: 4 mg via INTRAVENOUS

## 2017-01-07 MED ORDER — MIDAZOLAM HCL 5 MG/5ML IJ SOLN
INTRAMUSCULAR | Status: AC
  Filled 2017-01-07: qty 10

## 2017-01-07 MED ORDER — STERILE WATER FOR IRRIGATION IR SOLN
Status: DC | PRN
  Administered 2017-01-07: 15:00:00

## 2017-01-07 MED ORDER — ONDANSETRON HCL 4 MG/2ML IJ SOLN
INTRAMUSCULAR | Status: AC
  Filled 2017-01-07: qty 2

## 2017-01-07 MED ORDER — SODIUM CHLORIDE 0.9 % IV SOLN
INTRAVENOUS | Status: DC
  Administered 2017-01-07: 15:00:00 via INTRAVENOUS

## 2017-01-07 MED ORDER — LIDOCAINE VISCOUS 2 % MT SOLN
OROMUCOSAL | Status: AC
  Filled 2017-01-07: qty 15

## 2017-01-07 MED ORDER — MEPERIDINE HCL 100 MG/ML IJ SOLN
INTRAMUSCULAR | Status: AC
  Filled 2017-01-07: qty 2

## 2017-01-07 MED ORDER — MEPERIDINE HCL 100 MG/ML IJ SOLN
INTRAMUSCULAR | Status: DC | PRN
  Administered 2017-01-07 (×2): 25 mg via INTRAVENOUS
  Administered 2017-01-07: 50 mg via INTRAVENOUS

## 2017-01-07 NOTE — Discharge Instructions (Addendum)
EGD Discharge instructions Please read the instructions outlined below and refer to this sheet in the next few weeks. These discharge instructions provide you with general information on caring for yourself after you leave the hospital. Your doctor may also give you specific instructions. While your treatment has been planned according to the most current medical practices available, unavoidable complications occasionally occur. If you have any problems or questions after discharge, please call your doctor. ACTIVITY  You may resume your regular activity but move at a slower pace for the next 24 hours.   Take frequent rest periods for the next 24 hours.   Walking will help expel (get rid of) the air and reduce the bloated feeling in your abdomen.   No driving for 24 hours (because of the anesthesia (medicine) used during the test).   You may shower.   Do not sign any important legal documents or operate any machinery for 24 hours (because of the anesthesia used during the test).  NUTRITION  Drink plenty of fluids.   You may resume your normal diet.   Begin with a light meal and progress to your normal diet.   Avoid alcoholic beverages for 24 hours or as instructed by your caregiver.  MEDICATIONS  You may resume your normal medications unless your caregiver tells you otherwise.  WHAT YOU CAN EXPECT TODAY  You may experience abdominal discomfort such as a feeling of fullness or gas pains.  FOLLOW-UP  Your doctor will discuss the results of your test with you.  SEEK IMMEDIATE MEDICAL ATTENTION IF ANY OF THE FOLLOWING OCCUR:  Excessive nausea (feeling sick to your stomach) and/or vomiting.   Severe abdominal pain and distention (swelling).   Trouble swallowing.   Temperature over 101 F (37.8 C).   Rectal bleeding or vomiting of blood.   Colonoscopy Discharge Instructions  Read the instructions outlined below and refer to this sheet in the next few weeks. These  discharge instructions provide you with general information on caring for yourself after you leave the hospital. Your doctor may also give you specific instructions. While your treatment has been planned according to the most current medical practices available, unavoidable complications occasionally occur. If you have any problems or questions after discharge, call Dr. Gala Romney at [phone]. ACTIVITY  You may resume your regular activity, but move at a slower pace for the next 24 hours.   Take frequent rest periods for the next 24 hours.   Walking will help get rid of the air and reduce the bloated feeling in your belly (abdomen).   No driving for 24 hours (because of the medicine (anesthesia) used during the test).    Do not sign any important legal documents or operate any machinery for 24 hours (because of the anesthesia used during the test).  NUTRITION  Drink plenty of fluids.   You may resume your normal diet as instructed by your doctor.   Begin with a light meal and progress to your normal diet. Heavy or fried foods are harder to digest and may make you feel sick to your stomach (nauseated).   Avoid alcoholic beverages for 24 hours or as instructed.  MEDICATIONS  You may resume your normal medications unless your doctor tells you otherwise.  WHAT YOU CAN EXPECT TODAY  Some feelings of bloating in the abdomen.   Passage of more gas than usual.   Spotting of blood in your stool or on the toilet paper.  IF YOU HAD POLYPS REMOVED DURING THE COLONOSCOPY:  No aspirin products for 7 days or as instructed.   No alcohol for 7 days or as instructed.   Eat a soft diet for the next 24 hours.  FINDING OUT THE RESULTS OF YOUR TEST Not all test results are available during your visit. If your test results are not back during the visit, make an appointment with your caregiver to find out the results. Do not assume everything is normal if you have not heard from your caregiver or the  medical facility. It is important for you to follow up on all of your test results.  SEEK IMMEDIATE MEDICAL ATTENTION IF:  You have more than a spotting of blood in your stool.   Your belly is swollen (abdominal distention).   You are nauseated or vomiting.   You have a temperature over 101.   You have abdominal pain or discomfort that is severe or gets worse throughout the day.   Colon polyp and diverticulosis information provided  Continue current medication regimen including Inderal  No MRI until clip no longer present  Office visit with [name] in 1 month set up a repeat banding session  Further recommendations to follow pending review of pathology report     Colon Polyps Polyps are tissue growths inside the body. Polyps can grow in many places, including the large intestine (colon). A polyp may be a round bump or a mushroom-shaped growth. You could have one polyp or several. Most colon polyps are noncancerous (benign). However, some colon polyps can become cancerous over time. What are the causes? The exact cause of colon polyps is not known. What increases the risk? This condition is more likely to develop in people who:  Have a family history of colon cancer or colon polyps.  Are older than 28 or older than 45 if they are African American.  Have inflammatory bowel disease, such as ulcerative colitis or Crohn disease.  Are overweight.  Smoke cigarettes.  Do not get enough exercise.  Drink too much alcohol.  Eat a diet that is:  High in fat and red meat.  Low in fiber.  Had childhood cancer that was treated with abdominal radiation. What are the signs or symptoms? Most polyps do not cause symptoms. If you have symptoms, they may include:  Blood coming from your rectum when having a bowel movement.  Blood in your stool.The stool may look dark red or black.  A change in bowel habits, such as constipation or diarrhea. How is this diagnosed? This condition  is diagnosed with a colonoscopy. This is a procedure that uses a lighted, flexible scope to look at the inside of your colon. How is this treated? Treatment for this condition involves removing any polyps that are found. Those polyps will then be tested for cancer. If cancer is found, your health care provider will talk to you about options for colon cancer treatment. Follow these instructions at home: Diet   Eat plenty of fiber, such as fruits, vegetables, and whole grains.  Eat foods that are high in calcium and vitamin D, such as milk, cheese, yogurt, eggs, liver, fish, and broccoli.  Limit foods high in fat, red meats, and processed meats, such as hot dogs, sausage, bacon, and lunch meats.  Maintain a healthy weight, or lose weight if recommended by your health care provider. General instructions   Do not smoke cigarettes.  Do not drink alcohol excessively.  Keep all follow-up visits as told by your health care provider. This is important. This  includes keeping regularly scheduled colonoscopies. Talk to your health care provider about when you need a colonoscopy.  Exercise every day or as told by your health care provider. Contact a health care provider if:  You have new or worsening bleeding during a bowel movement.  You have new or increased blood in your stool.  You have a change in bowel habits.  You unexpectedly lose weight. This information is not intended to replace advice given to you by your health care provider. Make sure you discuss any questions you have with your health care provider. Document Released: 05/27/2004 Document Revised: 02/06/2016 Document Reviewed: 07/22/2015 Elsevier Interactive Patient Education  2017 [employer]    Diverticulosis Diverticulosis is a condition that develops when small pouches (diverticula) form in the wall of the large intestine (colon). The colon is where water is absorbed and stool is formed. The pouches form when the inside  layer of the colon pushes through weak spots in the outer layers of the colon. You may have a few pouches or many of them. What are the causes? The cause of this condition is not known. What increases the risk? The following factors may make you more likely to develop this condition: Being older than age 24. Your risk for this condition increases with age. Diverticulosis is rare among people younger than age 69. By age 74, many people have it. Eating a low-fiber diet. Having frequent constipation. Being overweight. Not getting enough exercise. Smoking. Taking over-the-counter pain medicines, like aspirin and ibuprofen. Having a family history of diverticulosis. What are the signs or symptoms? In most people, there are no symptoms of this condition. If you do have symptoms, they may include: Bloating. Cramps in the abdomen. Constipation or diarrhea. Pain in the lower left side of the abdomen. How is this diagnosed? This condition is most often diagnosed during an exam for other colon problems. Because diverticulosis usually has no symptoms, it often cannot be diagnosed independently. This condition may be diagnosed by: Using a flexible scope to examine the colon (colonoscopy). Taking an X-ray of the colon after dye has been put into the colon (barium enema). Doing a CT scan. How is this treated? You may not need treatment for this condition if you have never developed an infection related to diverticulosis. If you have had an infection before, treatment may include: Eating a high-fiber diet. This may include eating more fruits, vegetables, and grains. Taking a fiber supplement. Taking a live bacteria supplement (probiotic). Taking medicine to relax your colon. Taking antibiotic medicines. Follow these instructions at home: Drink 6-8 glasses of water or more each day to prevent constipation. Try not to strain when you have a bowel movement. If you have had an infection before: Eat  more fiber as directed by your health care provider or your diet and nutrition specialist (dietitian). Take a fiber supplement or probiotic, if your health care provider approves. Take over-the-counter and prescription medicines only as told by your health care provider. If you were prescribed an antibiotic, take it as told by your health care provider. Do not stop taking the antibiotic even if you start to feel better. Keep all follow-up visits as told by your health care provider. This is important. Contact a health care provider if: You have pain in your abdomen. You have bloating. You have cramps. You have not had a bowel movement in 3 days. Get help right away if: Your pain gets worse. Your bloating becomes very bad. You have a fever  or chills, and your symptoms suddenly get worse. You vomit. You have bowel movements that are bloody or black. You have bleeding from your rectum. Summary Diverticulosis is a condition that develops when small pouches (diverticula) form in the wall of the large intestine (colon). You may have a few pouches or many of them. This condition is most often diagnosed during an exam for other colon problems. If you have had an infection related to diverticulosis, treatment may include increasing the fiber in your diet, taking supplements, or taking medicines. This information is not intended to replace advice given to you by your health care provider. Make sure you discuss any questions you have with your health care provider. Document Released: 05/28/2004 Document Revised: 07/20/2016 Document Reviewed: 07/20/2016 Elsevier Interactive Patient Education  2017 [employer].

## 2017-01-07 NOTE — H&P (Signed)
@LOGO @   Primary Care Physician:  Glenda Chroman, MD Primary Gastroenterologist:  Dr. Gala Romney  Pre-Procedure History & Physical: HPI:  Alisha [name] is a 66 y.o. female here for surveillance EGD. History of [name] with varices and recent variceal hemorrhage. Also presented a somewhat atypically with diverticulitis. Abnormal cross-sectional imaging of the colon previously. She nor has any abdominal pain. History of advanced tubular adenoma.  She is being brought back for both a surveillance EGD and a diagnostic colonoscopy.  Past Medical History:  Diagnosis Date  . B12 deficiency 11/03/2016  . B12 deficiency 11/03/2016  . Barrett's esophagus   . Cirrhosis of liver without mention of alcohol    hep B surface antigen and HCV ab negative.pt has not had hepatitis A and B vaccines.U/S on 07/04/13 shows cirrhosis.  . Diverticula of colon    pancolonic  . Esophageal reflux   . Esophageal varices ([place])   . Hiatal hernia   . Iron deficiency anemia, unspecified   . Other and unspecified hyperlipidemia   . Portal hypertensive gastropathy ([place])   . Tubular adenoma   . Type II or unspecified type diabetes mellitus without mention of complication, not stated as uncontrolled   . Unspecified essential hypertension   . Unspecified hemorrhoids without mention of complication     Past Surgical History:  Procedure Laterality Date  . ABDOMINAL HYSTERECTOMY    . BIOPSY  04/30/2016   Procedure: BIOPSY;  Surgeon: Daneil Dolin, MD;  Location: [facility];  Service: Endoscopy;;  esophagus  . CESAREAN SECTION     x 2  . COLONOSCOPY  03/2006   left sided diverticula, splenic flexure tublar adenoma  . COLONOSCOPY  07/2002   villous tubular adenoma in rectum  . COLONOSCOPY  09/23/09   external hemorrhoids/scattered pan colonic diverticula otherwise normal. cecal lipoma bx negative, normal TI. Next TCS 09/2014  . COLONOSCOPY N/A 11/12/2014   Procedure: COLONOSCOPY;  Surgeon: Daneil Dolin, MD;  Location:  [facility];  Service: Endoscopy;  Laterality: N/A;  1215 - moved to 12:30 - Ginger to notify pt  . ESOPHAGEAL BANDING N/A 12/20/2016   Procedure: ESOPHAGEAL BANDING;  Surgeon: Danie Binder, MD;  Location: [facility];  Service: Endoscopy;  Laterality: N/A;  . ESOPHAGOGASTRODUODENOSCOPY  08/2006   barretts esophagus, no dyplasia  . ESOPHAGOGASTRODUODENOSCOPY  03/2006   barretts esophagus,bx focal atypia c/w low grade dysplasia, SB bx negative for celiac  . ESOPHAGOGASTRODUODENOSCOPY  09/23/09   3 columns of grade 2 esophageal varices/hiatal hernia/3-4 cm segment Barrett's esophagus without dysplasia. Next EGD 09/2012  . ESOPHAGOGASTRODUODENOSCOPY N/A 11/11/2012   Dr. Gala Romney- Barrett;s esophagus, esophageal varices, portal gastopathy. hiatal hernia  . ESOPHAGOGASTRODUODENOSCOPY N/A 04/30/2016   Procedure: ESOPHAGOGASTRODUODENOSCOPY (EGD);  Surgeon: Daneil Dolin, MD;  [facility]: [facility];  Service: Endoscopy;  Laterality: N/A;  815  . ESOPHAGOGASTRODUODENOSCOPY N/A 12/20/2016   Procedure: ESOPHAGOGASTRODUODENOSCOPY (EGD);  Surgeon: Danie Binder, MD;  Location: [facility];  Service: Endoscopy;  Laterality: N/A;  . HEMORRHOID SURGERY  2008  . small bowel capsule endoscopy  10/2009   normal    Prior to Admission medications   Medication Sig Start Date End Date Taking? Authorizing Provider  cyanocobalamin (,VITAMIN B-12,) 1000 MCG/ML injection Inject 1,000 mcg into the muscle every 30 (thirty) days. Next due January 01, 2017   Yes Historical Provider, MD  GLIPIZIDE XL 10 MG 24 hr tablet Take 10 mg by mouth daily.  02/24/12  Yes Historical Provider, MD  lisinopril (PRINIVIL,ZESTRIL) 10 MG tablet Take 10 mg by mouth daily.  04/09/12  Yes Historical Provider, MD  metFORMIN (GLUCOPHAGE) 1000 MG tablet Take 1,000 mg by mouth 2 (two) times daily with a meal.  02/09/12  Yes Historical Provider, MD  metroNIDAZOLE (FLAGYL) 500 MG tablet Take 1 tablet (500 mg total) by mouth every 8 (eight) hours. 12/24/16   Yes Orvan Falconer, MD  pantoprazole (PROTONIX) 40 MG tablet Take 1 tablet (40 mg total) by mouth 2 (two) times daily before a meal. 12/24/16  Yes Orvan Falconer, MD  polyethylene glycol-electrolytes (TRILYTE) 420 g solution Take 4,000 mLs by mouth as directed. 12/28/16  Yes Daneil Dolin, MD  propranolol (INDERAL) 10 MG tablet Take 3 tablets in the morning and 2 in the evening 05/22/16  Yes Annitta Needs, NP  VICTOZA 18 MG/3ML SOLN Inject 1.8 mg into the skin daily.  04/15/12  Yes Historical Provider, MD  Calcium Carbonate Antacid (TUMS PO) Take 2 tablets by mouth daily as needed (heart burn).    Historical Provider, MD  ciprofloxacin (CIPRO) 500 MG tablet Take 1 tablet (500 mg total) by mouth 2 (two) times daily. Patient not taking: Reported on 01/05/2017 12/24/16   Orvan Falconer, MD    Allergies as of 01/04/2017 - Review Complete 01/01/2017  Allergen Reaction Noted  . Codeine Nausea And Vomiting 04/19/2012    Family History  Problem Relation Age of Onset  . Alzheimer's disease Mother   . CAD Father   . Diabetes Mellitus II Father   . Alzheimer's disease Father   . Diabetes Mellitus II Sister   . Cancer - Other Brother   . Diabetes Mellitus II Brother   . Hypertension Brother     Social History   Social History  . Marital status: Married    Spouse name: N/A  . Number of children: N/A  . Years of education: N/A   Occupational History  . Not on file.   Social History Main Topics  . Smoking status: Never Smoker  . Smokeless tobacco: Never Used  . Alcohol use No  . Drug use: No  . Sexual activity: Yes   Other Topics Concern  . Not on file   Social History Narrative  . No narrative on file    Review of Systems: See HPI, otherwise negative ROS  Physical Exam: BP 134/67   Pulse 68   Temp 97.6 F (36.4 C) (Oral)   Resp 20   SpO2 96%  General:   Alert,   pleasant and cooperative in NAD Skin:  Intact without significant lesions or rashes. Eyes:  Sclera clear, no icterus.   Conjunctiva  pink. Ears:  Normal auditory acuity. Nose:  No deformity, discharge,  or lesions. Mouth:  No deformity or lesions. Neck:  Supple; no masses or thyromegaly. No significant cervical adenopathy. Lungs:  Clear throughout to auscultation.   No wheezes, crackles, or rhonchi. No acute distress. Heart:  Regular rate and rhythm; no murmurs, clicks, rubs,  or gallops. Abdomen: Non-distended, normal bowel sounds.  Soft and nontender without appreciable mass or hepatosplenomegaly.  Pulses:  Normal pulses noted. Extremities:  Without clubbing or edema.  Impression / Recommendations:  [place]/cirrhotic with recent variceal hemorrhage status post banding. Clinically doing okay-here for follow-up EGD and possible further band placement. Atypical presentation for diverticulitis recently. History of tubular adenoma. Abnormal: Cross-sectional imaging. Colonoscopy being done as well     Notice: This dictation was prepared with Dragon dictation along with smaller phrase technology. Any  transcriptional errors that result from this process are unintentional and may not be corrected upon review.

## 2017-01-07 NOTE — Op Note (Signed)
[facility] Patient Name: Alisha [name] Procedure Date: 01/07/2017 3:20 PM MRN: [id number] Date of Birth: [birth year] Attending MD: Norvel Richards , MD CSN: [id number] Age: 66 Admit Type: Outpatient Procedure:                Colonoscopy Indications:              Abnormal CT of the GI tract Providers:                Norvel Richards, MD, Gwenlyn Fudge RN, RN,                            Randa Spike, Technician Referring MD:              Medicines:                Midazolam 5 mg IV, Meperidine 628 mg IV Complications:            No immediate complications. Estimated Blood Loss:     Estimated blood loss was minimal. Procedure:                Pre-Anesthesia Assessment:                           - Prior to the procedure, a History and Physical                            was performed, and patient medications and                            allergies were reviewed. The patient's tolerance of                            previous anesthesia was also reviewed. The risks                            and benefits of the procedure and the sedation                            options and risks were discussed with the patient.                            All questions were answered, and informed consent                            was obtained. Prior Anticoagulants: The patient has                            taken no previous anticoagulant or antiplatelet                            agents. ASA Grade Assessment: III - A patient with                            severe systemic disease. After reviewing the risks  and benefits, the patient was deemed in                            satisfactory condition to undergo the procedure.                           - Prior to the procedure, a History and Physical                            was performed, and patient medications and                            allergies were reviewed. The patient's tolerance of            previous anesthesia was also reviewed. The risks                            and benefits of the procedure and the sedation                            options and risks were discussed with the patient.                            All questions were answered, and informed consent                            was obtained. ASA Grade Assessment: III - A patient                            with severe systemic disease. After reviewing the                            risks and benefits, the patient was deemed in                            satisfactory condition to undergo the procedure.                           After obtaining informed consent, the colonoscope                            was passed under direct vision. Throughout the                            procedure, the patient's blood pressure, pulse, and                            oxygen saturations were monitored continuously. The                            [id number] ([id number]) scope was introduced through                            the anus and advanced to  the the cecum, identified                            by appendiceal orifice and ileocecal valve. The                            colonoscopy was performed without difficulty. The                            patient tolerated the procedure well. The quality                            of the bowel preparation was adequate. The                            ileocecal valve, appendiceal orifice, and rectum                            were photographed. The entire colon was well                            visualized. The patient tolerated the procedure                            well. The quality of the bowel preparation was                            adequate. Scope In: 3:24:37 PM Scope Out: 3:41:18 PM Scope Withdrawal Time: 0 hours 11 minutes 44 seconds  Total Procedure Duration: 0 hours 16 minutes 41 seconds  Findings:      The perianal exam findings include internal hemorrhoids that do  not       return to the anal canal, thus continuously prolapsed (Grade IV).      A 7 mm polyp was found in the cecum. The polyp was semi-sessile. The       polyp was removed with a hot snare. Resection and retrieval were       complete. Estimated blood loss: none. hemostasis clip -1 placed to       ensure future hemostasis.      5 mm ulcer - sigmoid mucosa ?"status post biopsy.      Multiple small and large-mouthed diverticula were found in the colon.      The exam was otherwise without abnormality on direct and retroflexion       views. Congested colonic mucosa diffusely. Impression:               -                           - Internal hemorrhoids that do not return to the                            anal canal, thus continuously prolapsed (Grade IV)                            found on perianal exam.                           -  One 7 mm polyp in the cecum, removed with a hot                            snare. Resected and retrieved.                           - Diverticulosis. Congested mucosa. Focally                            ulcerated segment of sigmoid mucosa?"status post                            biopsy                           - The examination was otherwise normal on direct                            and retroflexion views. I wonder about an element                            of ischemic colitis and not just diverticulitis. Moderate Sedation:      Moderate (conscious) sedation was administered by the endoscopy nurse       and supervised by the endoscopist. The following parameters were       monitored: oxygen saturation, heart rate, blood pressure, and response       to care. Total physician intraservice time was 39 minutes. Recommendation:           - Patient has a contact number available for                            emergencies. The signs and symptoms of potential                            delayed complications were discussed with the                            patient.  Return to normal activities tomorrow.                            Written discharge instructions were provided to the                            patient.                           - Continue present medications.                           - Await pathology results.                           - Repeat colonoscopy date to be determined after  pending pathology results are reviewed for                            surveillance based on pathology results.                           - Return to GI clinic in 4 weeks. See EGD report.                           - Soft diet. Procedure Code(s):        --- Professional ---                           [phone], Colonoscopy, flexible; with removal of                            tumor(s), polyp(s), or other lesion(s) by snare                            technique                           99152, Moderate sedation services provided by the                            same physician or other qualified health care                            professional performing the diagnostic or                            therapeutic service that the sedation supports,                            requiring the presence of an independent trained                            observer to assist in the monitoring of the                            patient's level of consciousness and physiological                            status; initial 15 minutes of intraservice time,                            patient age 42 years or older                           [phone], Moderate sedation services; each additional                            15 minutes intraservice time  51700, Moderate sedation services; each additional                            15 minutes intraservice time Diagnosis Code(s):        --- Professional ---                           D12.6, Benign neoplasm of colon, unspecified                           D12.0, Benign neoplasm of cecum                            K64.3, Fourth degree hemorrhoids                           K57.30, Diverticulosis of large intestine without                            perforation or abscess without bleeding                           R93.3, Abnormal findings on diagnostic imaging of                            other parts of digestive tract CPT copyright 2016 [employer]. All rights reserved. The codes documented in this report are preliminary and upon coder review may  be revised to meet current compliance requirements. Cristopher Estimable. Ileah Falkenstein, MD Norvel Richards, MD 01/07/2017 3:59:55 PM This report has been signed electronically. Number of Addenda: 0

## 2017-01-07 NOTE — Op Note (Signed)
[facility] Patient Name: Alisha [name] Procedure Date: 01/07/2017 2:40 PM MRN: [id number] Date of Birth: [birth year] Attending MD: Norvel Richards , MD CSN: [id number] Age: 66 Admit Type: Outpatient Procedure:                Upper GI endoscopy Indications:              Surveillance procedure Providers:                Norvel Richards, MD, Jeanann Lewandowsky. Gwenlyn Perking RN, RN,                            Randa Spike, Technician Referring MD:              Medicines:                Midazolam 5 mg IV, Meperidine 100 mg IV,                            Ondansetron 4 mg IV Complications:            No immediate complications. Estimated Blood Loss:     Estimated blood loss: none. Procedure:                Pre-Anesthesia Assessment:                           - Prior to the procedure, a History and Physical                            was performed, and patient medications and                            allergies were reviewed. The patient's tolerance of                            previous anesthesia was also reviewed. The risks                            and benefits of the procedure and the sedation                            options and risks were discussed with the patient.                            All questions were answered, and informed consent                            was obtained. Prior Anticoagulants: The patient has                            taken no previous anticoagulant or antiplatelet                            agents. ASA Grade Assessment: III - A patient with  severe systemic disease. After reviewing the risks                            and benefits, the patient was deemed in                            satisfactory condition to undergo the procedure.                           After obtaining informed consent, the endoscope was                            passed under direct vision. Throughout the                            procedure, the  patient's blood pressure, pulse, and                            oxygen saturations were monitored continuously. The                            [id number] ([id number]) scope was introduced through the                            mouth, and advanced to the second part of duodenum.                            The upper GI endoscopy was accomplished without                            difficulty. The patient tolerated the procedure                            well. Scope In: 3:10:20 PM Scope Out: 3:20:33 PM Total Procedure Duration: 0 hours 10 minutes 13 seconds  Findings:      There were esophageal mucosal changes consistent with short-segment       Barrett's esophagus present in the lower third of the esophagus. The       maximum longitudinal extent of these mucosal changes was 3 cm in length.      Grade III varices were found in the lower third of the esophagus. (4)       columns.Six bands were successfully placed with incomplete eradication       of varices. There was no bleeding during the procedure.      A small hiatal hernia was present.      Diffuse portal hypertensive gastropathy was found in the stomach.      The duodenal bulb and second portion of the duodenum were normal. Impression:               - Esophageal mucosal changes consistent with                            short-segment Barrett's esophagus.                           -  Grade III esophageal varices. Incompletely                            eradicated. Banded.                           - Small hiatal hernia.                           - Portal hypertensive gastropathy.                           - Normal duodenal bulb and second portion of the                            duodenum.                           - No specimens collected. Moderate Sedation:      Moderate (conscious) sedation was administered by the endoscopy nurse       and supervised by the endoscopist. The following parameters were       monitored: oxygen saturation,  heart rate, blood pressure, respiratory       rate, EKG, adequacy of pulmonary ventilation, and response to care.       Total physician intraservice time was 18 minutes. Recommendation:           - Patient has a contact number available for                            emergencies. The signs and symptoms of potential                            delayed complications were discussed with the                            patient. Return to normal activities tomorrow.                            Written discharge instructions were provided to the                            patient.                           - Resume previous diet. Continue Inderal                           - Continue present medications.                           - Await pathology results.                           - Repeat upper endoscopy in 1 month for                            surveillance.                           -  Return to GI clinic in 1 month. See colonoscopy                            report. Procedure Code(s):        --- Professional ---                           [phone], Esophagogastroduodenoscopy, flexible,                            transoral; with band ligation of esophageal/gastric                            varices                           99152, Moderate sedation services provided by the                            same physician or other qualified health care                            professional performing the diagnostic or                            therapeutic service that the sedation supports,                            requiring the presence of an independent trained                            observer to assist in the monitoring of the                            patient's level of consciousness and physiological                            status; initial 15 minutes of intraservice time,                            patient age 102 years or older Diagnosis Code(s):        --- Professional ---                            K22.8, Other specified diseases of esophagus                           I85.00, Esophageal varices without bleeding                           K44.9, Diaphragmatic hernia without obstruction or                            gangrene  K76.6, Portal hypertension                           K31.89, Other diseases of stomach and duodenum CPT copyright 2016 American Medical Association. All rights reserved. The codes documented in this report are preliminary and upon coder review may  be revised to meet current compliance requirements. Cristopher Estimable. Lowery Paullin, MD Norvel Richards, MD 01/07/2017 3:49:11 PM This report has been signed electronically. Number of Addenda: 0

## 2017-01-12 ENCOUNTER — Encounter: Payer: Self-pay | Admitting: Internal Medicine

## 2017-01-29 ENCOUNTER — Other Ambulatory Visit (HOSPITAL_COMMUNITY): Payer: Self-pay | Admitting: *Deleted

## 2017-01-29 DIAGNOSIS — D509 Iron deficiency anemia, unspecified: Secondary | ICD-10-CM

## 2017-02-02 ENCOUNTER — Encounter (HOSPITAL_BASED_OUTPATIENT_CLINIC_OR_DEPARTMENT_OTHER): Payer: Medicare Other

## 2017-02-02 ENCOUNTER — Encounter (HOSPITAL_COMMUNITY): Payer: Self-pay

## 2017-02-02 ENCOUNTER — Encounter (HOSPITAL_COMMUNITY): Payer: Medicare Other | Attending: Oncology | Admitting: Oncology

## 2017-02-02 ENCOUNTER — Encounter (HOSPITAL_COMMUNITY): Payer: Medicare Other

## 2017-02-02 VITALS — BP 133/60 | HR 79 | Temp 97.6°F | Resp 20 | Wt 175.2 lb

## 2017-02-02 DIAGNOSIS — K3189 Other diseases of stomach and duodenum: Secondary | ICD-10-CM | POA: Insufficient documentation

## 2017-02-02 DIAGNOSIS — R042 Hemoptysis: Secondary | ICD-10-CM | POA: Diagnosis not present

## 2017-02-02 DIAGNOSIS — R161 Splenomegaly, not elsewhere classified: Secondary | ICD-10-CM | POA: Diagnosis not present

## 2017-02-02 DIAGNOSIS — K649 Unspecified hemorrhoids: Secondary | ICD-10-CM | POA: Diagnosis not present

## 2017-02-02 DIAGNOSIS — D509 Iron deficiency anemia, unspecified: Secondary | ICD-10-CM

## 2017-02-02 DIAGNOSIS — K2270 Barrett's esophagus without dysplasia: Secondary | ICD-10-CM | POA: Insufficient documentation

## 2017-02-02 DIAGNOSIS — D500 Iron deficiency anemia secondary to blood loss (chronic): Secondary | ICD-10-CM

## 2017-02-02 DIAGNOSIS — D6959 Other secondary thrombocytopenia: Secondary | ICD-10-CM | POA: Insufficient documentation

## 2017-02-02 DIAGNOSIS — E785 Hyperlipidemia, unspecified: Secondary | ICD-10-CM | POA: Insufficient documentation

## 2017-02-02 DIAGNOSIS — Z7984 Long term (current) use of oral hypoglycemic drugs: Secondary | ICD-10-CM | POA: Insufficient documentation

## 2017-02-02 DIAGNOSIS — I8501 Esophageal varices with bleeding: Secondary | ICD-10-CM | POA: Diagnosis not present

## 2017-02-02 DIAGNOSIS — E538 Deficiency of other specified B group vitamins: Secondary | ICD-10-CM | POA: Diagnosis not present

## 2017-02-02 DIAGNOSIS — D696 Thrombocytopenia, unspecified: Secondary | ICD-10-CM

## 2017-02-02 DIAGNOSIS — K7460 Unspecified cirrhosis of liver: Secondary | ICD-10-CM | POA: Insufficient documentation

## 2017-02-02 DIAGNOSIS — K7581 Nonalcoholic steatohepatitis (NASH): Secondary | ICD-10-CM

## 2017-02-02 DIAGNOSIS — Z885 Allergy status to narcotic agent status: Secondary | ICD-10-CM | POA: Diagnosis not present

## 2017-02-02 DIAGNOSIS — I10 Essential (primary) hypertension: Secondary | ICD-10-CM | POA: Diagnosis not present

## 2017-02-02 DIAGNOSIS — K219 Gastro-esophageal reflux disease without esophagitis: Secondary | ICD-10-CM | POA: Diagnosis not present

## 2017-02-02 DIAGNOSIS — K766 Portal hypertension: Secondary | ICD-10-CM | POA: Diagnosis not present

## 2017-02-02 DIAGNOSIS — D649 Anemia, unspecified: Secondary | ICD-10-CM | POA: Diagnosis present

## 2017-02-02 DIAGNOSIS — E119 Type 2 diabetes mellitus without complications: Secondary | ICD-10-CM | POA: Diagnosis not present

## 2017-02-02 LAB — FERRITIN: Ferritin: 13 ng/mL (ref 11–307)

## 2017-02-02 LAB — CBC WITH DIFFERENTIAL/PLATELET
BASOS PCT: 0 %
Basophils Absolute: 0 10*3/uL (ref 0.0–0.1)
EOS ABS: 0.1 10*3/uL (ref 0.0–0.7)
Eosinophils Relative: 4 %
HCT: 33.1 % — ABNORMAL LOW (ref 36.0–46.0)
Hemoglobin: 10.9 g/dL — ABNORMAL LOW (ref 12.0–15.0)
LYMPHS ABS: 0.7 10*3/uL (ref 0.7–4.0)
Lymphocytes Relative: 26 %
MCH: 28.8 pg (ref 26.0–34.0)
MCHC: 32.9 g/dL (ref 30.0–36.0)
MCV: 87.6 fL (ref 78.0–100.0)
Monocytes Absolute: 0.2 10*3/uL (ref 0.1–1.0)
Monocytes Relative: 5 %
NEUTROS PCT: 64 %
Neutro Abs: 1.8 10*3/uL (ref 1.7–7.7)
PLATELETS: 49 10*3/uL — AB (ref 150–400)
RBC: 3.78 MIL/uL — AB (ref 3.87–5.11)
RDW: 14.2 % (ref 11.5–15.5)
WBC: 2.8 10*3/uL — AB (ref 4.0–10.5)

## 2017-02-02 LAB — COMPREHENSIVE METABOLIC PANEL
ALBUMIN: 3.7 g/dL (ref 3.5–5.0)
ALT: 23 U/L (ref 14–54)
ANION GAP: 9 (ref 5–15)
AST: 42 U/L — ABNORMAL HIGH (ref 15–41)
Alkaline Phosphatase: 53 U/L (ref 38–126)
BUN: 9 mg/dL (ref 6–20)
CHLORIDE: 104 mmol/L (ref 101–111)
CO2: 20 mmol/L — AB (ref 22–32)
Calcium: 9 mg/dL (ref 8.9–10.3)
Creatinine, Ser: 0.74 mg/dL (ref 0.44–1.00)
GFR calc non Af Amer: >60 mL/min (ref >60)
Glucose, Bld: 335 mg/dL — ABNORMAL HIGH (ref 65–99)
Potassium: 4.4 mmol/L (ref 3.5–5.1)
SODIUM: 133 mmol/L — AB (ref 135–145)
Total Bilirubin: 1.8 mg/dL — ABNORMAL HIGH (ref 0.3–1.2)
Total Protein: 6.4 g/dL — ABNORMAL LOW (ref 6.5–8.1)

## 2017-02-02 LAB — IRON AND TIBC
IRON: 57 ug/dL (ref 28–170)
Saturation Ratios: 16 % (ref 10.4–31.8)
TIBC: 351 ug/dL (ref 250–450)
UIBC: 294 ug/dL

## 2017-02-02 MED ORDER — CYANOCOBALAMIN 1000 MCG/ML IJ SOLN
1000.0000 ug | Freq: Once | INTRAMUSCULAR | Status: AC
  Administered 2017-02-02: 1000 ug via INTRAMUSCULAR

## 2017-02-02 NOTE — Progress Notes (Signed)
Alisha [name] tolerated Vit B12 injection well without complaints or incident. Pt discharged self ambulatory in satisfactory condition accompanied by her husband

## 2017-02-02 NOTE — Patient Instructions (Signed)
[facility] Discharge Instructions  RECOMMENDATIONS MADE BY THE CONSULTANT AND ANY TEST RESULTS WILL BE SENT TO YOUR REFERRING PHYSICIAN.  Received Vit B12 injection today. Follow-up as scheduled. Call clinic for any questions or concerns  Thank you for choosing [facility] at [facility] to provide your oncology and hematology care.  To afford each patient quality time with our provider, please arrive at least 15 minutes before your scheduled appointment time.    If you have a lab appointment with the Cancer Center please come in thru the  Main Entrance and check in at the main information desk  You need to re-schedule your appointment should you arrive 10 or more minutes late.  We strive to give you quality time with our providers, and arriving late affects you and other patients whose appointments are after yours.  Also, if you no show three or more times for appointments you may be dismissed from the clinic at the providers discretion.     Again, thank you for choosing [facility].  Our hope is that these requests will decrease the amount of time that you wait before being seen by our physicians.       _____________________________________________________________  Should you have questions after your visit to [facility], please contact our office at [phone] between the hours of 8:30 a.m. and 4:30 p.m.  Voicemails left after 4:30 p.m. will not be returned until the following business day.  For prescription refill requests, have your pharmacy contact our office.       Resources For Cancer Patients and their Caregivers ? American Cancer Society: Can assist with transportation, wigs, general needs, runs Look Good Feel Better.        1-888-227-6333 ? Cancer Care: Provides financial assistance, online support groups, medication/co-pay assistance.  1-800-813-HOPE (4673) ? [facility] Assists [place] cancer patients and their families through emotional , educational and financial support.  336-427-4357 ? [place] DSS Where to apply for food stamps, Medicaid and utility assistance. 336-342-1394 ? [facility]: Transportation to medical appointments. 336-347-2287 ? Social Security Administration: May apply for disability if have a Stage IV cancer. 336-342-7796 1-800-772-1213 ? [place] [facility]ging, Disability and Transit Services: Assists with nutrition, care and transit needs. 336-349-2343  Cancer Center Support Programs: @10RELATIVEDAYS@ > Cancer Support Group  2nd Tuesday of the month 1pm-2pm, Journey Room  > Creative Journey  3rd Tuesday of the month 1130am-1pm, Journey Room  > Look Good Feel Better  1st Wednesday of the month 10am-12 noon, Journey Room (Call American [employer] to register 1-800-395-5775)   

## 2017-02-02 NOTE — Progress Notes (Signed)
HEMATOLOGY/ONCOLOGY PROGRESS NOTE  Date of Service: 02/02/2017  Patient Care Team: Glenda Chroman, MD as PCP - General (Internal Medicine) Gala Romney Cristopher Estimable, MD (Gastroenterology)  CHIEF COMPLAINTS/PURPOSE OF CONSULTATION:  Anemia  HISTORY OF PRESENTING ILLNESS:  Alisha [name] is a wonderful 66 y.o. female who returns for management of anemia and thrombocytopenia  Patient was hospitalized in April 2018 for hemoptysis. She was found to have a bleeding esophageal varices and had 7 bands placed in the hospital and another 6 bands placed in the outpatient setting by Dr. Gala Romney. She was not given a blood transfusion while she was inpatient, her hemoglobin stays stable in the 8 g/dl range. She had a repeat EGD and colonoscopy on 01/09/17 with results that shown below. Patient has not had any repeat episodes of hemoptysis and she states that she feels better today than she did prior to going to the hospital. CBC performed today demonstrated her hemoglobin is up to 10.9 g/dl, platelet count 46K. she is having some mild diarrhea with about 2-3 bowel movements a day. She states she has diarrhea right after she eats. Otherwise she denies any chest pain, shortness breath, abdominal pain, easy bruising, or any new bleeding issues.    MEDICAL HISTORY:  Past Medical History:  Diagnosis Date  . B12 deficiency 11/03/2016  . B12 deficiency 11/03/2016  . Barrett's esophagus   . Cirrhosis of liver without mention of alcohol    hep B surface antigen and HCV ab negative.pt has not had hepatitis A and B vaccines.U/S on 07/04/13 shows cirrhosis.  . Diverticula of colon    pancolonic  . Esophageal reflux   . Esophageal varices ([place])   . Hiatal hernia   . Iron deficiency anemia, unspecified   . Other and unspecified hyperlipidemia   . Portal hypertensive gastropathy ([place])   . Tubular adenoma   . Type II or unspecified type diabetes mellitus without mention of complication, not stated as uncontrolled   .  Unspecified essential hypertension   . Unspecified hemorrhoids without mention of complication     SURGICAL HISTORY: Past Surgical History:  Procedure Laterality Date  . ABDOMINAL HYSTERECTOMY    . BIOPSY  04/30/2016   Procedure: BIOPSY;  Surgeon: Daneil Dolin, MD;  [facility];  Service: Endoscopy;;  esophagus  . CESAREAN SECTION     x 2  . COLONOSCOPY  03/2006   left sided diverticula, splenic flexure tublar adenoma  . COLONOSCOPY  07/2002   villous tubular adenoma in rectum  . COLONOSCOPY  09/23/09   external hemorrhoids/scattered pan colonic diverticula otherwise normal. cecal lipoma bx negative, normal TI. Next TCS 09/2014  . COLONOSCOPY N/A 11/12/2014   Procedure: COLONOSCOPY;  Surgeon: Daneil Dolin, MD;  [facility];  Service: Endoscopy;  Laterality: N/A;  1215 - moved to 12:30 - Ginger to notify pt  . ESOPHAGEAL BANDING N/A 12/20/2016   Procedure: ESOPHAGEAL BANDING;  Surgeon: Danie Binder, MD;  [facility];  Service: Endoscopy;  Laterality: N/A;  . ESOPHAGOGASTRODUODENOSCOPY  08/2006   barretts esophagus, [name] dyplasia  . ESOPHAGOGASTRODUODENOSCOPY  03/2006   barretts esophagus,bx focal atypia c/w low grade dysplasia, SB bx negative for celiac  . ESOPHAGOGASTRODUODENOSCOPY  09/23/09   3 columns of grade 2 esophageal varices/hiatal hernia/3-4 cm segment Barrett's esophagus without dysplasia. Next EGD 09/2012  . ESOPHAGOGASTRODUODENOSCOPY N/A 11/11/2012   Dr. Gala Romney- Barrett;s esophagus, esophageal varices, portal gastopathy. hiatal hernia  . ESOPHAGOGASTRODUODENOSCOPY N/A 04/30/2016   Procedure: ESOPHAGOGASTRODUODENOSCOPY (  EGD);  Surgeon: Daneil Dolin, MD;  [facility];  Service: Endoscopy;  Laterality: N/A;  815  . ESOPHAGOGASTRODUODENOSCOPY N/A 12/20/2016   Procedure: ESOPHAGOGASTRODUODENOSCOPY (EGD);  Surgeon: Danie Binder, MD;  [facility];  Service: Endoscopy;  Laterality: N/A;  . HEMORRHOID SURGERY  2008  . small  bowel capsule endoscopy  10/2009   normal    SOCIAL HISTORY: Social History   Social History  . Marital status: Married    Spouse name: N/A  . Number of children: N/A  . Years of education: N/A   Occupational History  . Not on file.   Social History Main Topics  . Smoking status: Never Smoker  . Smokeless tobacco: Never Used  . Alcohol use [name]  . Drug use: [name]  . Sexual activity: Yes   Other Topics Concern  . Not on file   Social History Narrative  . [name] narrative on file    FAMILY HISTORY: Family History  Problem Relation Age of Onset  . Alzheimer's disease Mother   . CAD Father   . Diabetes Mellitus II Father   . Alzheimer's disease Father   . Diabetes Mellitus II Sister   . Cancer - Other Brother   . Diabetes Mellitus II Brother   . Hypertension Brother     ALLERGIES:  is allergic to codeine.  MEDICATIONS:  Current Outpatient Prescriptions  Medication Sig Dispense Refill  . Calcium Carbonate Antacid (TUMS PO) Take 2 tablets by mouth daily as needed (heart burn).    . ciprofloxacin (CIPRO) 500 MG tablet Take 1 tablet (500 mg total) by mouth 2 (two) times daily. 20 tablet 0  . cyanocobalamin (,VITAMIN B-12,) 1000 MCG/ML injection Inject 1,000 mcg into the muscle every 30 (thirty) days. Next due January 01, 2017    . GLIPIZIDE XL 10 MG 24 hr tablet Take 10 mg by mouth daily.     Marland Kitchen lisinopril (PRINIVIL,ZESTRIL) 10 MG tablet Take 10 mg by mouth daily.     . metFORMIN (GLUCOPHAGE) 1000 MG tablet Take 1,000 mg by mouth 2 (two) times daily with a meal.     . metroNIDAZOLE (FLAGYL) 500 MG tablet Take 1 tablet (500 mg total) by mouth every 8 (eight) hours. 30 tablet 0  . pantoprazole (PROTONIX) 40 MG tablet Take 1 tablet (40 mg total) by mouth 2 (two) times daily before a meal. 60 tablet 1  . polyethylene glycol-electrolytes (TRILYTE) 420 g solution Take 4,000 mLs by mouth as directed. 4000 mL 0  . propranolol (INDERAL) 10 MG tablet Take 3 tablets in the morning and 2 in  the evening 150 tablet 11  . VICTOZA 18 MG/3ML SOLN Inject 1.8 mg into the skin daily.      [name] current facility-administered medications for this visit.     REVIEW OF SYSTEMS:   Review of Systems  Constitutional: Negative.  Negative for malaise/fatigue.       [name] pica  HENT: Negative.   Eyes: Negative.   Respiratory: Negative.   Cardiovascular: Negative.  Negative for leg swelling.  Gastrointestinal: Negative.  Negative for abdominal pain, blood in stool and melena.  Genitourinary: Negative.  Negative for hematuria.  Musculoskeletal: Negative.   Skin: Negative.   Neurological: Negative.  Negative for tingling.  Endo/Heme/Allergies: Negative.  Does not bruise/bleed easily.  Psychiatric/Behavioral: Negative.   All other systems reviewed and are negative. 14 Point review of Systems was done is negative except as noted above.  PHYSICAL EXAMINATION: ECOG PERFORMANCE STATUS:  1 - Symptomatic but completely ambulatory Vitals:   02/02/17 1116  BP: 133/60  Pulse: 79  Resp: 20  Temp: 97.6 F (36.4 C)    Physical Exam  Constitutional: She is oriented to person, place, and time and well-developed, well-nourished, and in [name] distress.  HENT:  Head: Normocephalic and atraumatic.  Eyes: EOM are normal. Pupils are equal, round, and reactive to light.  Neck: Normal range of motion. Neck supple.  Cardiovascular: Normal rate and regular rhythm.   Pulmonary/Chest: Effort normal and breath sounds normal.  Abdominal: Soft. Bowel sounds are normal.  Musculoskeletal: Normal range of motion.  Neurological: She is alert and oriented to person, place, and time. Gait normal.  Skin: Skin is warm and dry.  Nursing note and vitals reviewed.  LABORATORY DATA:  I have reviewed the data as listed  CBC Latest Ref Rng & Units 02/02/2017 12/24/2016 12/23/2016  WBC 4.0 - 10.5 K/uL 2.8(L) 3.1(L) 4.2  Hemoglobin 12.0 - 15.0 g/dL 10.9(L) 8.1(L) 8.6(L)  Hematocrit 36.0 - 46.0 % 33.1(L) 24.1(L) 25.6(L)    Platelets 150 - 400 K/uL 49(L) 62(L) 76(L)    . CMP Latest Ref Rng & Units 02/02/2017 12/23/2016 12/22/2016  Glucose 65 - 99 mg/dL 335(H) 189(H) 304(H)  BUN 6 - 20 mg/dL 9 <5(L) 5(L)  Creatinine 0.44 - 1.00 mg/dL 0.74 0.68 0.64  Sodium 135 - 145 mmol/L 133(L) 137 134(L)  Potassium 3.5 - 5.1 mmol/L 4.4 3.6 4.1  Chloride 101 - 111 mmol/L 104 109 106  CO2 22 - 32 mmol/L 20(L) 23 21(L)  Calcium 8.9 - 10.3 mg/dL 9.0 8.1(L) 8.1(L)  Total Protein 6.5 - 8.1 g/dL 6.4(L) - -  Total Bilirubin 0.3 - 1.2 mg/dL 1.8(H) - -  Alkaline Phos 38 - 126 U/L 53 - -  AST 15 - 41 U/L 42(H) - -  ALT 14 - 54 U/L 23 - -    PATHOLOGY:   Colonoscopy 01/09/17: - Internal hemorrhoids that do not return to the anal canal, thus continuously prolapsed (Grade IV) found on perianal exam. - One 7 mm polyp in the cecum, removed with a hot snare. Resected and retrieved. - Diverticulosis. Congested mucosa. Focally ulcerated segment of sigmoid mucosa?status post biopsy - The examination was otherwise normal on direct and retroflexion views. I wonder about an element of ischemic colitis and not just diverticulitis. Diagnosis 1. Colon, polyp(s), cecal - TUBULAR ADENOMA(S). - HIGH GRADE DYSPLASIA IS NOT IDENTIFIED. 2. Colon, biopsy, sigmoid - BENIGN COLONIC MUCOSA. - [name] PATHOLOGICAL ALTERATIONS.  EGD 01/09/17 Esophageal mucosal changes consistent with short-segment Barrett's esophagus. - Grade III esophageal varices. Incompletely eradicated. Banded. - Small hiatal hernia. - Portal hypertensive gastropathy. - Normal duodenal bulb and second portion of the duodenum. - [name] specimens collected.    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 Hemoglobin is actually up to 10.9 g/dL  Iron studies pending. If her ferritin is below 100, I will plan to give her some more IV iron for replacement.   Continue f/u with Dr. Gala Romney, she has an appointment tomorrow.   She will return for follow up in 3 months with labs; CBC, CMP, iron studies.   All of the patients questions were answered with apparent satisfaction. The patient knows to call the  clinic with any problems, questions or concerns.   Twana First, MD  02/02/2017 11:23 AM

## 2017-02-02 NOTE — Patient Instructions (Signed)
[place] at [facility] Discharge Instructions  RECOMMENDATIONS MADE BY THE CONSULTANT AND ANY TEST RESULTS WILL BE SENT TO YOUR REFERRING PHYSICIAN.  You were seen today by Dr. Twana First    Thank you for choosing [place] at [facility] to provide your oncology and hematology care.  To afford each patient quality time with our provider, please arrive at least 15 minutes before your scheduled appointment time.    If you have a lab appointment with the [facility] please come in thru the  Main Entrance and check in at the main information desk  You need to re-schedule your appointment should you arrive 10 or more minutes late.  We strive to give you quality time with our providers, and arriving late affects you and other patients whose appointments are after yours.  Also, if you no show three or more times for appointments you may be dismissed from the clinic at the providers discretion.     Again, thank you for choosing [facility].  Our hope is that these requests will decrease the amount of time that you wait before being seen by our physicians.       _____________________________________________________________  Should you have questions after your visit to [facility], please contact our office at [phone] between the hours of 8:30 a.m. and 4:30 p.m.  Voicemails left after 4:30 p.m. will not be returned until the following business day.  For prescription refill requests, have your pharmacy contact our office.       Resources For Cancer Patients and their Caregivers ? American Cancer Society: Can assist with transportation, wigs, general needs, runs Look Good Feel Better.        (971)572-8606 ? Cancer Care: Provides financial assistance, online support groups, medication/co-pay assistance.  1-800-813-HOPE (812) 643-0580) ? [employer] Assists [place] cancer patients and their  families through emotional , educational and financial support.  413-758-9930 ? [place] DSS Where to apply for food stamps, Medicaid and utility assistance. (313) 543-3380 ? [employer]: Transportation to medical appointments. 562-496-4939 ? Social Security Administration: May apply for disability if have a Stage IV cancer. 706-440-2768 (978)733-7668 ? [employer], Disability and Transit Services: Assists with nutrition, care and transit needs. [place] Support Programs: @10RELATIVEDAYS @ > Cancer Support Group  2nd Tuesday of the month 1pm-2pm, Journey Room  > Creative Journey  3rd Tuesday of the month 1130am-1pm, Journey Room  > Look Good Feel Better  1st Wednesday of the month 10am-12 noon, Journey Room (Call Prue to register [phone])

## 2017-02-03 ENCOUNTER — Other Ambulatory Visit: Payer: Self-pay

## 2017-02-03 ENCOUNTER — Ambulatory Visit (INDEPENDENT_AMBULATORY_CARE_PROVIDER_SITE_OTHER): Payer: Medicare Other | Admitting: Nurse Practitioner

## 2017-02-03 ENCOUNTER — Encounter: Payer: Self-pay | Admitting: Nurse Practitioner

## 2017-02-03 VITALS — BP 123/68 | HR 80 | Temp 96.9°F | Ht 61.0 in | Wt 165.6 lb

## 2017-02-03 DIAGNOSIS — K7460 Unspecified cirrhosis of liver: Secondary | ICD-10-CM

## 2017-02-03 DIAGNOSIS — I8510 Secondary esophageal varices without bleeding: Secondary | ICD-10-CM

## 2017-02-03 DIAGNOSIS — I8500 Esophageal varices without bleeding: Secondary | ICD-10-CM

## 2017-02-03 NOTE — Assessment & Plan Note (Signed)
2 episodes of variceal banding recently, recommended one month follow-up to schedule another upper endoscopy with possible esophageal banding. She is on Inderal 30 mg in the morning and 20 mg in the evening. Her heart rate is not at goal L increases to 30 mg twice a variceal prophylaxis. She is generally asymptomatic from a GI and hepatic standpoint today. We will have her return for follow-up in 3 months for routine cirrhosis care to draw labs and abdominal imaging per her program. Return sooner based on postprocedure recommendations.  Of note she is wondering if it is time to return to the transplant center for another evaluation. She was previously seen at [facility] but would like to go to [facility] at this point. Given her variceal bleeding this may write her more consideration. This would likely best be answered and a few months for me update her labs and get an updated meld score.

## 2017-02-03 NOTE — Patient Instructions (Signed)
1. Increase your Inderal medication to 30 mg twice a day. 2. We will schedule your repeat endoscopy with possible banding. 3. Return for follow-up in 3 months for routine liver care. 4. We may need to see you sooner based on recommendations made after your procedure. 5. At your next office visit we can discuss possibly sending you back to [facility] since you have had a bleeding episode because of swollen blood vessels in near swallowing tube. 6. Call with any worsening symptoms or new symptoms.

## 2017-02-03 NOTE — Assessment & Plan Note (Signed)
Alisha [name] cirrhosis RD seen by [facility] for transplant consideration but not sick enough based on meld score. She is on Inderal for variceal prophylaxis and has since had an upper GI bleed with 2 episodes of EGD with variceal banding. Recommended repeat banding and surveillance. We will schedule that today. Return for follow-up in 3 months for routine cirrhosis care. She may need to return sooner based on postprocedure recommendations. She is interested in possible re-referral to a transplant center, would prefer [facility] at this point. We can discuss this at her routine care appointment.

## 2017-02-03 NOTE — Progress Notes (Signed)
cc'ed to pcp °

## 2017-02-03 NOTE — Progress Notes (Signed)
Referring Provider: Glenda Chroman, MD Primary Care Physician:  Glenda Chroman, MD Primary GI:  Dr. Gala Romney  Chief Complaint  Patient presents with  . Cirrhosis    pp f/u, doing ok    HPI:   Alisha [name] is a 66 y.o. female who presents to discuss possible hemorrhoid banding. The patient has a history of cirrhosis and was last seen on 12/01/2016 for this. The patient underwent endoscopy on 12/20/2016 for hematemesis, hematochezia, melena in the setting of cirrhosis and esophageal varices. 2 columns of nonbleeding grade 2 and 3 columns of grade 3 varices were found with stigmata of recent bleed and red well signs. 7 bands successfully placed with complete eradication. Moderate portal hypertensive gastropathy.  Repeat EGD completed 01/07/2017 found esophageal mucosal changes consistent with short segment Barrett's, grade 3 esophageal varices and completely eradicated status post banding with 6 bands placed, portal hypertensive gastropathy. Repeat upper endoscopy in 1 months for surveillance, return to the clinic in 1 month. Colonoscopy completed the same day found internal hemorrhoids it does not return to the internal anal canal thus continuously prolapsed, a 7 mm polyp in the cecum status post removal, diverticulosis and congested mucosa with a focally ulcerated segment of sigmoid mucosa status post biopsy, otherwise normal. Surgical pathology found the polyp to be tubular adenoma and the colon biopsy to be benign. Recommended 5 year repeat colonoscopy.  Today she states she's doing well. Denies abdominal pain, N/V, hematochezia, melena, unintentional weight loss, fever, chills. States her eyes "stay a little yellow, but that's not new." Denies darkened urine, acute episodic confusion. Denies chest pain, dyspnea, dizziness, lightheadedness, syncope, near syncope. Denies any other upper or lower GI symptoms.  She is on beta blocker prophylaxis but her heart rate today is 80.  Past Medical  History:  Diagnosis Date  . B12 deficiency 11/03/2016  . B12 deficiency 11/03/2016  . Barrett's esophagus   . Cirrhosis of liver without mention of alcohol    hep B surface antigen and HCV ab negative.pt has not had hepatitis A and B vaccines.U/S on 07/04/13 shows cirrhosis.  . Diverticula of colon    pancolonic  . Esophageal reflux   . Esophageal varices ([place])   . Hiatal hernia   . Iron deficiency anemia, unspecified   . Other and unspecified hyperlipidemia   . Portal hypertensive gastropathy ([place])   . Tubular adenoma   . Type II or unspecified type diabetes mellitus without mention of complication, not stated as uncontrolled   . Unspecified essential hypertension   . Unspecified hemorrhoids without mention of complication     Past Surgical History:  Procedure Laterality Date  . ABDOMINAL HYSTERECTOMY    . BIOPSY  04/30/2016   Procedure: BIOPSY;  Surgeon: Daneil Dolin, MD;  [facility];  Service: Endoscopy;;  esophagus  . CESAREAN SECTION     x 2  . COLONOSCOPY  03/2006   left sided diverticula, splenic flexure tublar adenoma  . COLONOSCOPY  07/2002   villous tubular adenoma in rectum  . COLONOSCOPY  09/23/09   external hemorrhoids/scattered pan colonic diverticula otherwise normal. cecal lipoma bx negative, normal TI. Next TCS 09/2014  . COLONOSCOPY N/A 11/12/2014   Procedure: COLONOSCOPY;  Surgeon: Daneil Dolin, MD;  [facility];  Service: Endoscopy;  Laterality: N/A;  1215 - moved to 12:30 - Ginger to notify pt  . ESOPHAGEAL BANDING N/A 12/20/2016   Procedure: ESOPHAGEAL BANDING;  Surgeon: Danie Binder, MD;  [facility];  Service: Endoscopy;  Laterality: N/A;  . ESOPHAGOGASTRODUODENOSCOPY  08/2006   barretts esophagus, no dyplasia  . ESOPHAGOGASTRODUODENOSCOPY  03/2006   barretts esophagus,bx focal atypia c/w low grade dysplasia, SB bx negative for celiac  . ESOPHAGOGASTRODUODENOSCOPY  09/23/09   3 columns of grade 2 esophageal  varices/hiatal hernia/3-4 cm segment Barrett's esophagus without dysplasia. Next EGD 09/2012  . ESOPHAGOGASTRODUODENOSCOPY N/A 11/11/2012   Dr. Gala Romney- Barrett;s esophagus, esophageal varices, portal gastopathy. hiatal hernia  . ESOPHAGOGASTRODUODENOSCOPY N/A 04/30/2016   Procedure: ESOPHAGOGASTRODUODENOSCOPY (EGD);  Surgeon: Daneil Dolin, MD;  [facility];  Service: Endoscopy;  Laterality: N/A;  815  . ESOPHAGOGASTRODUODENOSCOPY N/A 12/20/2016   Procedure: ESOPHAGOGASTRODUODENOSCOPY (EGD);  Surgeon: Danie Binder, MD;  [facility];  Service: Endoscopy;  Laterality: N/A;  . HEMORRHOID SURGERY  2008  . small bowel capsule endoscopy  10/2009   normal    Current Outpatient Prescriptions  Medication Sig Dispense Refill  . Calcium Carbonate Antacid (TUMS PO) Take 2 tablets by mouth daily as needed (heart burn).    . cyanocobalamin (,VITAMIN B-12,) 1000 MCG/ML injection Inject 1,000 mcg into the muscle every 30 (thirty) days. Next due January 01, 2017    . GLIPIZIDE XL 10 MG 24 hr tablet Take 10 mg by mouth daily.     Marland Kitchen lisinopril (PRINIVIL,ZESTRIL) 10 MG tablet Take 10 mg by mouth daily.     . metFORMIN (GLUCOPHAGE) 1000 MG tablet Take 1,000 mg by mouth 2 (two) times daily with a meal.     . pantoprazole (PROTONIX) 40 MG tablet Take 1 tablet (40 mg total) by mouth 2 (two) times daily before a meal. 60 tablet 1  . propranolol (INDERAL) 10 MG tablet Take 3 tablets in the morning and 2 in the evening 150 tablet 11  . VICTOZA 18 MG/3ML SOLN Inject 1.8 mg into the skin daily.      No current facility-administered medications for this visit.     Allergies as of 02/03/2017 - Review Complete 02/03/2017  Allergen Reaction Noted  . Codeine Nausea And Vomiting 04/19/2012    Family History  Problem Relation Age of Onset  . Alzheimer's disease Mother   . CAD Father   . Diabetes Mellitus II Father   . Alzheimer's disease Father   . Diabetes Mellitus II Sister   . Cancer - Other  Brother   . Diabetes Mellitus II Brother   . Hypertension Brother     Social History   Social History  . Marital status: Married    Spouse name: N/A  . Number of children: N/A  . Years of education: N/A   Social History Main Topics  . Smoking status: Never Smoker  . Smokeless tobacco: Never Used  . Alcohol use No  . Drug use: No  . Sexual activity: Yes   Other Topics Concern  . None   Social History Narrative  . None    Review of Systems: General: Negative for anorexia, weight loss, fever, chills, fatigue, weakness. ENT: Negative for hoarseness, difficulty swallowing. CV: Negative for chest pain, angina, palpitations, peripheral edema.  Respiratory: Negative for dyspnea at rest, cough, sputum, wheezing.  GI: See history of present illness.  MS: Negative for joint pain, low back pain.  Derm: Negative for rash or itching.  Neuro: Negative for weakness, abnormal sensation, seizure, frequent headaches, memory loss, confusion.  Endo: Negative for unusual weight change.  Heme: Negative for bruising or bleeding. Allergy: Negative for rash or  hives.   Physical Exam: BP 123/68   Pulse 80   Temp (!) 96.9 F (36.1 C) (Oral)   Ht 5' 1"  (1.549 m)   Wt 165 lb 9.6 oz (75.1 kg)   BMI 31.29 kg/m  General:   Alert and oriented. Pleasant and cooperative. Well-nourished and well-developed.  Eyes:  Without icterus, sclera clear and conjunctiva pink.  Ears:  Normal auditory acuity. Cardiovascular:  S1, S2 present without murmurs appreciated. Extremities without clubbing or edema. Respiratory:  Clear to auscultation bilaterally. No wheezes, rales, or rhonchi. No distress.  Gastrointestinal:  +BS, rounded but soft, non-tender and non-distended. No HSM noted. No guarding or rebound. No masses appreciated.  Rectal:  Deferred  Musculoskalatal:  Symmetrical without gross deformities. Neurologic:  Alert and oriented x4;  grossly normal neurologically. Psych:  Alert and cooperative.  Normal mood and affect. Heme/Lymph/Immune: No excessive bruising noted.    02/03/2017 9:51 AM   Disclaimer: This note was dictated with voice recognition software. Similar sounding words can inadvertently be transcribed and may not be corrected upon review.

## 2017-02-03 NOTE — Patient Instructions (Signed)
Per EG ok to hold metformin the morning for her EGD

## 2017-02-04 NOTE — Progress Notes (Signed)
Agree. No further recommendations at this time.

## 2017-03-05 ENCOUNTER — Ambulatory Visit (HOSPITAL_COMMUNITY): Payer: Medicare Other

## 2017-03-08 ENCOUNTER — Encounter (HOSPITAL_COMMUNITY): Payer: Medicare Other | Attending: Hematology

## 2017-03-08 ENCOUNTER — Encounter (HOSPITAL_COMMUNITY): Payer: Self-pay

## 2017-03-08 VITALS — BP 119/56 | HR 69 | Temp 97.8°F | Resp 18

## 2017-03-08 DIAGNOSIS — E119 Type 2 diabetes mellitus without complications: Secondary | ICD-10-CM | POA: Insufficient documentation

## 2017-03-08 DIAGNOSIS — E538 Deficiency of other specified B group vitamins: Secondary | ICD-10-CM | POA: Diagnosis not present

## 2017-03-08 DIAGNOSIS — K2270 Barrett's esophagus without dysplasia: Secondary | ICD-10-CM | POA: Insufficient documentation

## 2017-03-08 DIAGNOSIS — Z79899 Other long term (current) drug therapy: Secondary | ICD-10-CM | POA: Insufficient documentation

## 2017-03-08 DIAGNOSIS — D509 Iron deficiency anemia, unspecified: Secondary | ICD-10-CM | POA: Insufficient documentation

## 2017-03-08 DIAGNOSIS — D689 Coagulation defect, unspecified: Secondary | ICD-10-CM | POA: Insufficient documentation

## 2017-03-08 DIAGNOSIS — K5730 Diverticulosis of large intestine without perforation or abscess without bleeding: Secondary | ICD-10-CM | POA: Insufficient documentation

## 2017-03-08 DIAGNOSIS — D696 Thrombocytopenia, unspecified: Secondary | ICD-10-CM | POA: Insufficient documentation

## 2017-03-08 DIAGNOSIS — Z7984 Long term (current) use of oral hypoglycemic drugs: Secondary | ICD-10-CM | POA: Insufficient documentation

## 2017-03-08 DIAGNOSIS — K7689 Other specified diseases of liver: Secondary | ICD-10-CM | POA: Insufficient documentation

## 2017-03-08 DIAGNOSIS — R161 Splenomegaly, not elsewhere classified: Secondary | ICD-10-CM | POA: Insufficient documentation

## 2017-03-08 DIAGNOSIS — K7460 Unspecified cirrhosis of liver: Secondary | ICD-10-CM | POA: Insufficient documentation

## 2017-03-08 MED ORDER — CYANOCOBALAMIN 1000 MCG/ML IJ SOLN
1000.0000 ug | Freq: Once | INTRAMUSCULAR | Status: AC
  Administered 2017-03-08: 1000 ug via INTRAMUSCULAR

## 2017-03-08 NOTE — Patient Instructions (Signed)
[facility] Discharge Instructions  RECOMMENDATIONS MADE BY THE CONSULTANT AND ANY TEST RESULTS WILL BE SENT TO YOUR REFERRING PHYSICIAN.  Received Vit B12 injection today. Follow-up as scheduled. Call clinic for any questions or concerns  Thank you for choosing [facility] at [facility] to provide your oncology and hematology care.  To afford each patient quality time with our provider, please arrive at least 15 minutes before your scheduled appointment time.    If you have a lab appointment with the Cancer Center please come in thru the  Main Entrance and check in at the main information desk  You need to re-schedule your appointment should you arrive 10 or more minutes late.  We strive to give you quality time with our providers, and arriving late affects you and other patients whose appointments are after yours.  Also, if you no show three or more times for appointments you may be dismissed from the clinic at the providers discretion.     Again, thank you for choosing [facility].  Our hope is that these requests will decrease the amount of time that you wait before being seen by our physicians.       _____________________________________________________________  Should you have questions after your visit to [facility], please contact our office at [phone] between the hours of 8:30 a.m. and 4:30 p.m.  Voicemails left after 4:30 p.m. will not be returned until the following business day.  For prescription refill requests, have your pharmacy contact our office.       Resources For Cancer Patients and their Caregivers ? American Cancer Society: Can assist with transportation, wigs, general needs, runs Look Good Feel Better.        1-888-227-6333 ? Cancer Care: Provides financial assistance, online support groups, medication/co-pay assistance.  1-800-813-HOPE (4673) ? [facility] Assists [place] cancer patients and their families through emotional , educational and financial support.  336-427-4357 ? [place] DSS Where to apply for food stamps, Medicaid and utility assistance. 336-342-1394 ? [facility]: Transportation to medical appointments. 336-347-2287 ? Social Security Administration: May apply for disability if have a Stage IV cancer. 336-342-7796 1-800-772-1213 ? [place] Aging, Disability and Transit Services: Assists with nutrition, care and transit needs. 336-349-2343  Cancer Center Support Programs: @10RELATIVEDAYS@ > Cancer Support Group  2nd Tuesday of the month 1pm-2pm, Journey Room  > Creative Journey  3rd Tuesday of the month 1130am-1pm, Journey Room  > Look Good Feel Better  1st Wednesday of the month 10am-12 noon, Journey Room (Call American Cancer Society to register 1-800-395-5775)   

## 2017-03-08 NOTE — Progress Notes (Signed)
Alisha [name] tolerated Vit B12 injection well without complaints or incident. VSS Pt discharged self ambulatory in satisfactory condition accompanied by her husband

## 2017-03-18 ENCOUNTER — Encounter (HOSPITAL_COMMUNITY): Payer: Self-pay | Admitting: *Deleted

## 2017-03-18 ENCOUNTER — Other Ambulatory Visit: Payer: Self-pay

## 2017-03-18 ENCOUNTER — Ambulatory Visit (HOSPITAL_COMMUNITY)
Admission: RE | Admit: 2017-03-18 | Discharge: 2017-03-18 | Disposition: A | Discharge status: Home / Self Care | Payer: Medicare Other | Source: Ambulatory Visit | Attending: Internal Medicine | Admitting: Internal Medicine

## 2017-03-18 ENCOUNTER — Encounter (HOSPITAL_COMMUNITY)
Admission: RE | Disposition: A | Discharge status: Home / Self Care | Payer: Self-pay | Source: Ambulatory Visit | Attending: Internal Medicine

## 2017-03-18 DIAGNOSIS — K7460 Unspecified cirrhosis of liver: Secondary | ICD-10-CM

## 2017-03-18 DIAGNOSIS — I10 Essential (primary) hypertension: Secondary | ICD-10-CM | POA: Insufficient documentation

## 2017-03-18 DIAGNOSIS — K219 Gastro-esophageal reflux disease without esophagitis: Secondary | ICD-10-CM | POA: Insufficient documentation

## 2017-03-18 DIAGNOSIS — E119 Type 2 diabetes mellitus without complications: Secondary | ICD-10-CM | POA: Diagnosis not present

## 2017-03-18 DIAGNOSIS — Z885 Allergy status to narcotic agent status: Secondary | ICD-10-CM | POA: Diagnosis not present

## 2017-03-18 DIAGNOSIS — E785 Hyperlipidemia, unspecified: Secondary | ICD-10-CM | POA: Insufficient documentation

## 2017-03-18 DIAGNOSIS — I8500 Esophageal varices without bleeding: Secondary | ICD-10-CM | POA: Diagnosis not present

## 2017-03-18 DIAGNOSIS — K7581 Nonalcoholic steatohepatitis (NASH): Secondary | ICD-10-CM | POA: Diagnosis present

## 2017-03-18 DIAGNOSIS — K449 Diaphragmatic hernia without obstruction or gangrene: Secondary | ICD-10-CM | POA: Diagnosis not present

## 2017-03-18 DIAGNOSIS — Z7984 Long term (current) use of oral hypoglycemic drugs: Secondary | ICD-10-CM | POA: Diagnosis not present

## 2017-03-18 DIAGNOSIS — K766 Portal hypertension: Secondary | ICD-10-CM | POA: Diagnosis not present

## 2017-03-18 DIAGNOSIS — Z79899 Other long term (current) drug therapy: Secondary | ICD-10-CM | POA: Diagnosis not present

## 2017-03-18 DIAGNOSIS — K3189 Other diseases of stomach and duodenum: Secondary | ICD-10-CM

## 2017-03-18 DIAGNOSIS — D509 Iron deficiency anemia, unspecified: Secondary | ICD-10-CM

## 2017-03-18 DIAGNOSIS — E538 Deficiency of other specified B group vitamins: Secondary | ICD-10-CM | POA: Diagnosis not present

## 2017-03-18 DIAGNOSIS — Z8249 Family history of ischemic heart disease and other diseases of the circulatory system: Secondary | ICD-10-CM | POA: Diagnosis not present

## 2017-03-18 DIAGNOSIS — Z8719 Personal history of other diseases of the digestive system: Secondary | ICD-10-CM | POA: Diagnosis not present

## 2017-03-18 DIAGNOSIS — I8510 Secondary esophageal varices without bleeding: Secondary | ICD-10-CM | POA: Insufficient documentation

## 2017-03-18 HISTORY — PX: ESOPHAGOGASTRODUODENOSCOPY: SHX5428

## 2017-03-18 HISTORY — PX: ESOPHAGEAL BANDING: SHX5518

## 2017-03-18 LAB — GLUCOSE, CAPILLARY: GLUCOSE-CAPILLARY: 245 mg/dL — AB (ref 65–99)

## 2017-03-18 SURGERY — EGD (ESOPHAGOGASTRODUODENOSCOPY)
Anesthesia: Moderate Sedation

## 2017-03-18 MED ORDER — LIDOCAINE VISCOUS 2 % MT SOLN
OROMUCOSAL | Status: AC
  Filled 2017-03-18: qty 15

## 2017-03-18 MED ORDER — MEPERIDINE HCL 100 MG/ML IJ SOLN
INTRAMUSCULAR | Status: DC | PRN
  Administered 2017-03-18: 25 mg via INTRAVENOUS
  Administered 2017-03-18: 50 mg via INTRAVENOUS

## 2017-03-18 MED ORDER — MEPERIDINE HCL 100 MG/ML IJ SOLN
INTRAMUSCULAR | Status: AC
  Filled 2017-03-18: qty 2

## 2017-03-18 MED ORDER — LIDOCAINE VISCOUS 2 % MT SOLN
OROMUCOSAL | Status: DC | PRN
  Administered 2017-03-18: 4 mL via OROMUCOSAL

## 2017-03-18 MED ORDER — MIDAZOLAM HCL 5 MG/5ML IJ SOLN
INTRAMUSCULAR | Status: DC | PRN
  Administered 2017-03-18: 1 mg via INTRAVENOUS
  Administered 2017-03-18: 2 mg via INTRAVENOUS

## 2017-03-18 MED ORDER — SODIUM CHLORIDE 0.9 % IV SOLN: INTRAVENOUS | Status: DC

## 2017-03-18 MED ORDER — STERILE WATER FOR IRRIGATION IR SOLN
Status: DC | PRN
  Administered 2017-03-18: 100 mL

## 2017-03-18 MED ORDER — ONDANSETRON HCL 4 MG/2ML IJ SOLN
INTRAMUSCULAR | Status: AC
  Filled 2017-03-18: qty 2

## 2017-03-18 MED ORDER — ONDANSETRON HCL 4 MG/2ML IJ SOLN
INTRAMUSCULAR | Status: DC | PRN
  Administered 2017-03-18: 4 mg via INTRAVENOUS

## 2017-03-18 MED ORDER — MIDAZOLAM HCL 5 MG/5ML IJ SOLN
INTRAMUSCULAR | Status: AC
  Filled 2017-03-18: qty 10

## 2017-03-18 NOTE — Discharge Instructions (Addendum)
EGD Discharge instructions Please read the instructions outlined below and refer to this sheet in the next few weeks. These discharge instructions provide you with general information on caring for yourself after you leave the hospital. Your doctor may also give you specific instructions. While your treatment has been planned according to the most current medical practices available, unavoidable complications occasionally occur. If you have any problems or questions after discharge, please call your doctor. ACTIVITY  You may resume your regular activity but move at a slower pace for the next 24 hours.   Take frequent rest periods for the next 24 hours.   Walking will help expel (get rid of) the air and reduce the bloated feeling in your abdomen.   No driving for 24 hours (because of the anesthesia (medicine) used during the test).   You may shower.   Do not sign any important legal documents or operate any machinery for 24 hours (because of the anesthesia used during the test).  NUTRITION  Drink plenty of fluids.   You may resume your normal diet.   Begin with a light meal and progress to your normal diet.   Avoid alcoholic beverages for 24 hours or as instructed by your caregiver.  MEDICATIONS  You may resume your normal medications unless your caregiver tells you otherwise.  WHAT YOU CAN EXPECT TODAY  You may experience abdominal discomfort such as a feeling of fullness or gas pains.  FOLLOW-UP  Your doctor will discuss the results of your test with you.  SEEK IMMEDIATE MEDICAL ATTENTION IF ANY OF THE FOLLOWING OCCUR:  Excessive nausea (feeling sick to your stomach) and/or vomiting.   Severe abdominal pain and distention (swelling).   Trouble swallowing.   Temperature over 101 F (37.8 C).   Rectal bleeding or vomiting of blood.    Chloraseptic for any transient sore throat you may experience  Office visit with [name] in 6 months. Recommend repeat EGD at that time.

## 2017-03-18 NOTE — H&P (Signed)
@LOGO @   Primary Care Physician:  Glenda Chroman, MD Primary Gastroenterologist:  Dr. Gala Romney  Pre-Procedure History & Physical: HPI:  Alisha [name] is a 66 y.o. female here for follow-up EGD in EBL as appropriate. Last banding session in May of this year she's done well. Had a little sore throat after banding but overall  has done very well.  Past Medical History:  Diagnosis Date  . B12 deficiency 11/03/2016  . B12 deficiency 11/03/2016  . Barrett's esophagus   . Cirrhosis of liver without mention of alcohol    hep B surface antigen and HCV ab negative.pt has not had hepatitis A and B vaccines.U/S on 07/04/13 shows cirrhosis.  . Diverticula of colon    pancolonic  . Esophageal reflux   . Esophageal varices ([place])   . Hiatal hernia   . Iron deficiency anemia, unspecified   . Other and unspecified hyperlipidemia   . Portal hypertensive gastropathy ([place])   . Tubular adenoma   . Type II or unspecified type diabetes mellitus without mention of complication, not stated as uncontrolled   . Unspecified essential hypertension   . Unspecified hemorrhoids without mention of complication     Past Surgical History:  Procedure Laterality Date  . ABDOMINAL HYSTERECTOMY    . BIOPSY  04/30/2016   Procedure: BIOPSY;  Surgeon: Daneil Dolin, MD;  [facility];  Service: Endoscopy;;  esophagus  . CESAREAN SECTION     x 2  . COLONOSCOPY  03/2006   left sided diverticula, splenic flexure tublar adenoma  . COLONOSCOPY  07/2002   villous tubular adenoma in rectum  . COLONOSCOPY  09/23/09   external hemorrhoids/scattered pan colonic diverticula otherwise normal. cecal lipoma bx negative, normal TI. Next TCS 09/2014  . COLONOSCOPY N/A 11/12/2014   Procedure: COLONOSCOPY;  Surgeon: Daneil Dolin, MD;  [facility];  Service: Endoscopy;  Laterality: N/A;  1215 - moved to 12:30 - Ginger to notify pt  . COLONOSCOPY N/A 01/07/2017   Procedure: COLONOSCOPY;  Surgeon: Daneil Dolin,  MD;  [facility];  Service: Endoscopy;  Laterality: N/A;  3:15PM  . ESOPHAGEAL BANDING N/A 12/20/2016   Procedure: ESOPHAGEAL BANDING;  Surgeon: Danie Binder, MD;  [facility];  Service: Endoscopy;  Laterality: N/A;  . ESOPHAGOGASTRODUODENOSCOPY  08/2006   barretts esophagus, no dyplasia  . ESOPHAGOGASTRODUODENOSCOPY  03/2006   barretts esophagus,bx focal atypia c/w low grade dysplasia, SB bx negative for celiac  . ESOPHAGOGASTRODUODENOSCOPY  09/23/09   3 columns of grade 2 esophageal varices/hiatal hernia/3-4 cm segment Barrett's esophagus without dysplasia. Next EGD 09/2012  . ESOPHAGOGASTRODUODENOSCOPY N/A 11/11/2012   Dr. Gala Romney- Barrett;s esophagus, esophageal varices, portal gastopathy. hiatal hernia  . ESOPHAGOGASTRODUODENOSCOPY N/A 04/30/2016   Procedure: ESOPHAGOGASTRODUODENOSCOPY (EGD);  Surgeon: Daneil Dolin, MD;  [facility];  Service: Endoscopy;  Laterality: N/A;  815  . ESOPHAGOGASTRODUODENOSCOPY N/A 12/20/2016   Procedure: ESOPHAGOGASTRODUODENOSCOPY (EGD);  Surgeon: Danie Binder, MD;  [facility];  Service: Endoscopy;  Laterality: N/A;  . ESOPHAGOGASTRODUODENOSCOPY N/A 01/07/2017   Procedure: ESOPHAGOGASTRODUODENOSCOPY (EGD);  Surgeon: Daneil Dolin, MD;  [facility];  Service: Endoscopy;  Laterality: N/A;  . HEMORRHOID SURGERY  2008  . small bowel capsule endoscopy  10/2009   normal    Prior to Admission medications   Medication Sig Start Date End Date Taking? Authorizing Provider  cyanocobalamin (,VITAMIN B-12,) 1000 MCG/ML injection Inject 1,000 mcg into the muscle every  30 (thirty) days.    Yes [provider]  GLIPIZIDE XL 10 MG 24 hr tablet Take 10 mg by mouth daily.  02/24/12  Yes [provider]  lisinopril (PRINIVIL,ZESTRIL) 10 MG tablet Take 10 mg by mouth daily.  04/09/12  Yes [provider]  loperamide (IMODIUM) 2 MG capsule Take 4 mg by mouth as needed for diarrhea or loose stools.    Yes [provider]  metFORMIN (GLUCOPHAGE) 1000 MG tablet Take 1,000 mg by mouth 2 (two) times daily with a meal.  02/09/12  Yes [provider]  pantoprazole (PROTONIX) 40 MG tablet Take 1 tablet (40 mg total) by mouth 2 (two) times daily before a meal. 12/24/16  Yes Orvan Falconer, MD  propranolol (INDERAL) 10 MG tablet Take 3 tablets in the morning and 2 in the evening Patient taking differently: Take 30 mg by mouth 2 (two) times daily.  05/22/16  Yes Annitta Needs, NP  VICTOZA 18 MG/3ML SOLN Inject 1.8 mg into the skin daily.  04/15/12  Yes [provider]  Calcium Carbonate Antacid (TUMS PO) Take 2 tablets by mouth daily as needed (heart burn).    [provider]    Allergies as of 02/03/2017 - Review Complete 02/03/2017  Allergen Reaction Noted  . Codeine Nausea And Vomiting 04/19/2012    Family History  Problem Relation Age of Onset  . Alzheimer's disease Mother   . CAD Father   . Diabetes Mellitus II Father   . Alzheimer's disease Father   . Diabetes Mellitus II Sister   . Cancer - Other Brother   . Diabetes Mellitus II Brother   . Hypertension Brother     Social History   Social History  . Marital status: Married    Spouse name: N/A  . Number of children: N/A  . Years of education: N/A   Occupational History  . Not on file.   Social History Main Topics  . Smoking status: Never Smoker  . Smokeless tobacco: Never Used  . Alcohol use No  . Drug use: No  . Sexual activity: Yes   Other Topics Concern  . Not on file   Social History Narrative  . No narrative on file    Review of Systems: See HPI, otherwise negative ROS  Physical Exam: BP 130/62   Pulse 72   Temp 98.4 F (36.9 C) (Oral)   Resp 20   Ht 5' 1"  (1.549 m)   Wt 165 lb (74.8 kg)   SpO2 99%   BMI 31.18 kg/m  General:   Alert,  Well-developed, well-nourished, pleasant and cooperative in NAD Neck:  Supple; no masses or thyromegaly. No significant cervical  adenopathy. Lungs:  Clear throughout to auscultation.   No wheezes, crackles, or rhonchi. No acute distress. Heart:  Regular rate and rhythm; no murmurs, clicks, rubs,  or gallops. Abdomen: Non-distended, normal bowel sounds.  Soft and nontender without appreciable mass or hepatosplenomegaly.  Pulses:  Normal pulses noted. Extremities:  Without clubbing or edema.  Impression:  Patient with a history of [place]/cirrhosis with portal hypertension esophageal varices. Here for surveillance EGD. Will likely need additional bands.  Recommendations:  I have offered the patient EGD today.  The risks, benefits, limitations, alternatives and imponderables have been reviewed with the patient. Potential for esophageal dilation, biopsy, etc. have also been reviewed.  Questions have been answered. All parties agreeable.   Notice: This dictation was prepared with Dragon dictation along with smaller phrase technology. Any transcriptional  errors that result from this process are unintentional and may not be corrected upon review.

## 2017-03-18 NOTE — Op Note (Signed)
[facility] Patient Name: Alisha [name] Procedure Date: 03/18/2017 8:43 AM MRN: [id number] Date of Birth: 06/13/1951 Attending MD: Norvel Richards , MD CSN: [id number] Age: 66 Admit Type: Outpatient Procedure:                Upper GI endoscopy Indications:              Surveillance procedure Providers:                Norvel Richards, MD, Rosina Lowenstein, RN, Randa Spike, Technician Referring MD:              Medicines:                Midazolam 3 mg IV, Meperidine 75 mg IV, Ondansetron                            4 mg IV Complications:            No immediate complications. Estimated Blood Loss:     Estimated blood loss: none. Procedure:                Pre-Anesthesia Assessment:                           - Prior to the procedure, a History and Physical                            was performed, and patient medications and                            allergies were reviewed. The patient's tolerance of                            previous anesthesia was also reviewed. The risks                            and benefits of the procedure and the sedation                            options and risks were discussed with the patient.                            All questions were answered, and informed consent                            was obtained. Prior Anticoagulants: The patient has                            taken no previous anticoagulant or antiplatelet                            agents. ASA Grade Assessment: III - A patient with  severe systemic disease. After reviewing the risks                            and benefits, the patient was deemed in                            satisfactory condition to undergo the procedure.                           After obtaining informed consent, the endoscope was                            passed under direct vision. Throughout the                            procedure, the patient's  blood pressure, pulse, and                            oxygen saturations were monitored continuously. The                            [id number]-[id number] ([id number]) scope was introduced through the                            mouth, and advanced to the second part of duodenum.                            The upper GI endoscopy was accomplished without                            difficulty. The patient tolerated the procedure                            well. Scope In: 10:31:51 AM Scope Out: 10:41:39 AM Total Procedure Duration: 0 hours 9 minutes 48 seconds  Findings:      Varices were found in the mid esophagus and distal esophagus. Short       column grade 3; longer columns grade 2. Six bands were successfully       placed with incomplete eradication of varices. There was no bleeding at       the end of the procedure.      Moderate portal hypertensive gastropathy was found in the entire       examined stomach.      A small hiatal hernia was present.      The duodenal bulb and second portion of the duodenum were normal. Impression:               - Esophageal varices. Incompletely eradicated.                            Banded.                           - Portal hypertensive gastropathy.                           -  Small hiatal hernia.                           - Normal duodenal bulb and second portion of the                            duodenum.                           - No specimens collected. Moderate Sedation:      Moderate (conscious) sedation was administered by the endoscopy nurse       and supervised by the endoscopist. The following parameters were       monitored: oxygen saturation, heart rate, blood pressure, respiratory       rate, EKG, adequacy of pulmonary ventilation, and response to care.       Total physician intraservice time was 13 minutes. Recommendation:           - Patient has a contact number available for                            emergencies. The signs and symptoms of  potential                            delayed complications were discussed with the                            patient. Return to normal activities tomorrow.                            Written discharge instructions were provided to the                            patient.                           - Resume previous diet.                           - Continue present medications.                           - Repeat upper endoscopy in 6 months for                            surveillance.                           - Return to GI clinic in 6 months. Procedure Code(s):        --- Professional ---                           [phone], Esophagogastroduodenoscopy, flexible,                            transoral; with band ligation of esophageal/gastric  varices                           99152, Moderate sedation services provided by the                            same physician or other qualified health care                            professional performing the diagnostic or                            therapeutic service that the sedation supports,                            requiring the presence of an independent trained                            observer to assist in the monitoring of the                            patient's level of consciousness and physiological                            status; initial 15 minutes of intraservice time,                            patient age 103 years or older Diagnosis Code(s):        --- Professional ---                           I85.00, Esophageal varices without bleeding                           K76.6, Portal hypertension                           K31.89, Other diseases of stomach and duodenum                           K44.9, Diaphragmatic hernia without obstruction or                            gangrene CPT copyright 2016 American Medical Association. All rights reserved. The codes documented in this report are preliminary and upon  coder review may  be revised to meet current compliance requirements. Alisha [name]. Alisha Bahe, MD Norvel Richards, MD 03/18/2017 10:54:55 AM This report has been signed electronically. Number of Addenda: 0

## 2017-03-22 ENCOUNTER — Telehealth: Payer: Self-pay

## 2017-03-22 MED ORDER — PROPRANOLOL HCL 10 MG PO TABS: ORAL_TABLET | ORAL | 11 refills | Status: DC

## 2017-03-22 NOTE — Telephone Encounter (Signed)
RX done.

## 2017-03-22 NOTE — Telephone Encounter (Signed)
Pt called- left voicemail. She needs new rx for propranolol 28m sent to her pharmacy. She said EG increased it to 357min the am and 3036mn the pm. She has run out of medication. Please send in new rx to [facility]. Routing to the refill box.

## 2017-03-23 ENCOUNTER — Encounter (HOSPITAL_COMMUNITY): Payer: Self-pay | Admitting: Internal Medicine

## 2017-04-05 ENCOUNTER — Ambulatory Visit (HOSPITAL_COMMUNITY): Payer: Medicare Other

## 2017-04-07 ENCOUNTER — Encounter (HOSPITAL_COMMUNITY): Payer: Self-pay

## 2017-04-07 ENCOUNTER — Encounter (HOSPITAL_COMMUNITY): Payer: Medicare Other | Attending: Hematology

## 2017-04-07 VITALS — BP 126/65 | HR 81 | Temp 98.1°F | Resp 18

## 2017-04-07 DIAGNOSIS — K5730 Diverticulosis of large intestine without perforation or abscess without bleeding: Secondary | ICD-10-CM | POA: Insufficient documentation

## 2017-04-07 DIAGNOSIS — E538 Deficiency of other specified B group vitamins: Secondary | ICD-10-CM

## 2017-04-07 DIAGNOSIS — Z7984 Long term (current) use of oral hypoglycemic drugs: Secondary | ICD-10-CM | POA: Insufficient documentation

## 2017-04-07 DIAGNOSIS — E119 Type 2 diabetes mellitus without complications: Secondary | ICD-10-CM | POA: Insufficient documentation

## 2017-04-07 DIAGNOSIS — Z79899 Other long term (current) drug therapy: Secondary | ICD-10-CM | POA: Insufficient documentation

## 2017-04-07 DIAGNOSIS — K7460 Unspecified cirrhosis of liver: Secondary | ICD-10-CM | POA: Insufficient documentation

## 2017-04-07 DIAGNOSIS — D689 Coagulation defect, unspecified: Secondary | ICD-10-CM | POA: Insufficient documentation

## 2017-04-07 DIAGNOSIS — D696 Thrombocytopenia, unspecified: Secondary | ICD-10-CM | POA: Insufficient documentation

## 2017-04-07 DIAGNOSIS — K2270 Barrett's esophagus without dysplasia: Secondary | ICD-10-CM | POA: Insufficient documentation

## 2017-04-07 DIAGNOSIS — R161 Splenomegaly, not elsewhere classified: Secondary | ICD-10-CM | POA: Insufficient documentation

## 2017-04-07 DIAGNOSIS — D509 Iron deficiency anemia, unspecified: Secondary | ICD-10-CM | POA: Insufficient documentation

## 2017-04-07 DIAGNOSIS — K7689 Other specified diseases of liver: Secondary | ICD-10-CM | POA: Insufficient documentation

## 2017-04-07 MED ORDER — CYANOCOBALAMIN 1000 MCG/ML IJ SOLN
1000.0000 ug | Freq: Once | INTRAMUSCULAR | Status: AC
  Administered 2017-04-07: 1000 ug via INTRAMUSCULAR

## 2017-04-07 MED ORDER — CYANOCOBALAMIN 1000 MCG/ML IJ SOLN
INTRAMUSCULAR | Status: AC
  Filled 2017-04-07: qty 1

## 2017-04-07 NOTE — Progress Notes (Signed)
Alisha [name] presents today for injection per the provider's orders.  B12 administration without incident; see MAR for injection details.  Patient tolerated procedure well and without incident.  No questions or complaints noted at this time.  Discharged ambulatory in c/o spouse.

## 2017-04-28 ENCOUNTER — Telehealth: Payer: Self-pay | Admitting: Internal Medicine

## 2017-04-28 NOTE — Telephone Encounter (Signed)
Recall for ultrasound and mri/mrcp

## 2017-04-28 NOTE — Telephone Encounter (Signed)
Letter mailed

## 2017-05-06 ENCOUNTER — Ambulatory Visit (HOSPITAL_COMMUNITY): Payer: Medicare Other

## 2017-05-06 ENCOUNTER — Telehealth: Payer: Self-pay | Admitting: Nurse Practitioner

## 2017-05-06 ENCOUNTER — Other Ambulatory Visit: Payer: Self-pay

## 2017-05-06 ENCOUNTER — Ambulatory Visit (INDEPENDENT_AMBULATORY_CARE_PROVIDER_SITE_OTHER): Payer: Medicare Other | Admitting: Nurse Practitioner

## 2017-05-06 ENCOUNTER — Encounter: Payer: Self-pay | Admitting: Nurse Practitioner

## 2017-05-06 ENCOUNTER — Other Ambulatory Visit (HOSPITAL_COMMUNITY): Payer: Medicare Other

## 2017-05-06 VITALS — BP 120/69 | HR 84 | Temp 97.6°F | Ht 61.0 in | Wt 176.6 lb

## 2017-05-06 DIAGNOSIS — K7460 Unspecified cirrhosis of liver: Secondary | ICD-10-CM | POA: Diagnosis not present

## 2017-05-06 DIAGNOSIS — K760 Fatty (change of) liver, not elsewhere classified: Secondary | ICD-10-CM | POA: Diagnosis not present

## 2017-05-06 DIAGNOSIS — K769 Liver disease, unspecified: Secondary | ICD-10-CM

## 2017-05-06 DIAGNOSIS — I8510 Secondary esophageal varices without bleeding: Secondary | ICD-10-CM

## 2017-05-06 NOTE — Telephone Encounter (Signed)
Please refer the patient to [facility] for transplant evaluation due to cirrhosis with suspicious lesion on MRI and recent variceal hemorrhage.

## 2017-05-06 NOTE — Patient Instructions (Signed)
1. I have added a lab data the labs he will have drawn soon. 2. We will help schedule your MRI of your abdomen for you. 3. I will discuss with Dr. Gala Romney about sending you back to a transplant referral center at [facility]. 4. Return for follow-up in 6 months tentatively. 5. Call if you have any questions or concerns. 6. We will notify you when the referral has been sent

## 2017-05-06 NOTE — Progress Notes (Signed)
cc'd to pcp 

## 2017-05-06 NOTE — Telephone Encounter (Signed)
Referral has been made.

## 2017-05-06 NOTE — Assessment & Plan Note (Signed)
The patient has a history of grade 2 and grade 3 esophageal varices and is undergone multiple banding's. Her last banding showed continued grade 2 and 3 varices with bands placed. She is scheduled for 6 month repeat upper endoscopy. She was admitted for GI bleed and EGD during hospitalization admission showed stigmata of bleeding from her varices. Given this, as well suspicious lesions on her MRI, we will refer her to [facility] for transplant evaluation. Return for follow-up in 6 months. Updated labs and imaging have been ordered including MRI as per radiology recommendation.

## 2017-05-06 NOTE — Assessment & Plan Note (Signed)
Patient with a history of nonalcoholic cirrhosis. Overall she is doing well. She was seen for transplant evaluation at [place] approximately 4 years ago which point she had a meld score of 13. She was not a candidate for transplant at that time. Since then, she has had esophageal varices multiple banding's and variceal bleed as per above. She also has a suspicious lesion on MRI with a follow-up MRI ordered as per radiology recommendation. Labs are also order to update her meld score. I discussed with Dr. Gala Romney in that this point given these complications of her disease we will refer her back to a transplant center for another evaluation. They're requesting to go to [facility]. We will make a referral, tentatively plan follow-up in our office in 6 months to touch patient ensure things are moving along.

## 2017-05-06 NOTE — Progress Notes (Signed)
Referring Provider: Glenda Chroman, MD Primary Care Physician:  Glenda Chroman, MD Primary GI:  Dr. Gala Romney  Chief Complaint  Patient presents with  . Cirrhosis    f/u. doing ok    HPI:   Alisha [name] is a 66 y.o. female who presents for follow-up on cirrhosis and esophageal varices. Patient was last seen in our office 02/03/2017 and at that time it was noted upper endoscopy for 05/03/2017 for hematemesis and hematochezia, melena found 2 columns of nonbleeding grade 2 and 3 columns of grade 3 varices with stigmata of recent bleed and red wale signs with 7 bands successfully placed, also noted moderate portal hypertensive gastropathy. Repeat endoscopy April 2018 found mucosal changes consistent with short segment Barrett's, grade 3 esophageal varices eradicated status post placement of 6 bands, portal hypertensive gastropathy. Recommended repeat EGD in 1 month.  Colonoscopy up-to-date 01/07/2017 which found internal hemorrhoids it does not return to the internal anal canal thus continuously prolapsed, a 7 mm polyp in the cecum status post removal, diverticulosis and congested mucosa with a focally ulcerated segment of sigmoid mucosa status post biopsy, otherwise normal. Surgical pathology found the polyp to be tubular adenoma and the colon biopsy to be benign. Recommended 5 year repeat colonoscopy.  At her last visit she was doing well and generally asymptomatic from a hepatic standpoint. She was on beta blocker prophylaxis but her heart rate in the office was 80. Recommended she increase her Inderal to 30 mg twice a day and schedule repeat endoscopy, return for follow-up in 3 months. Also discussed possibility of referral back to transplant center due to bleeding episode secondary to esophageal varices.  Most recent EGD completed 03/18/2017 which found varices in the mid and distal esophagus with short columns found to be grade 3 and longer columns grade 2 with 6 bands successfully placed  and incomplete eradication of varices. Moderate portal hypertensive gastropathy in the entire stomach. Recommended repeat endoscopy in 6 months.  Today she states she's doing well overall. Has some difficulty swallowing for a couple days after her last EGD/banding. Denies abdominal. Rare/occasional nausea, no vomiting. Denies hematemesis, fever, chills, unintentional weight loss. Hs some chronic fatigue and is on B12 (has more energy after injection). Deneis yellowing of skin/eyes, acute episodic confusion, tremors, hematochezia, melena. Denies chest pain, dyspnea, dizziness, lightheadedness, syncope, near syncope. Denies any other upper or lower GI symptoms.  Past Medical History:  Diagnosis Date  . B12 deficiency 11/03/2016  . B12 deficiency 11/03/2016  . Barrett's esophagus   . Cirrhosis of liver without mention of alcohol    hep B surface antigen and HCV ab negative.pt has not had hepatitis A and B vaccines.U/S on 07/04/13 shows cirrhosis.  . Diverticula of colon    pancolonic  . Esophageal reflux   . Esophageal varices ([place])   . Hiatal hernia   . Iron deficiency anemia, unspecified   . Other and unspecified hyperlipidemia   . Portal hypertensive gastropathy ([place])   . Tubular adenoma   . Type II or unspecified type diabetes mellitus without mention of complication, not stated as uncontrolled   . Unspecified essential hypertension   . Unspecified hemorrhoids without mention of complication     Past Surgical History:  Procedure Laterality Date  . ABDOMINAL HYSTERECTOMY    . BIOPSY  04/30/2016   Procedure: BIOPSY;  Surgeon: Daneil Dolin, MD;  [facility];  Service: Endoscopy;;  esophagus  . CESAREAN SECTION  x 2  . COLONOSCOPY  03/2006   left sided diverticula, splenic flexure tublar adenoma  . COLONOSCOPY  07/2002   villous tubular adenoma in rectum  . COLONOSCOPY  09/23/09   external hemorrhoids/scattered pan colonic diverticula otherwise normal. cecal lipoma bx  negative, normal TI. Next TCS 09/2014  . COLONOSCOPY N/A 11/12/2014   Procedure: COLONOSCOPY;  Surgeon: Daneil Dolin, MD;  [facility];  Service: Endoscopy;  Laterality: N/A;  1215 - moved to 12:30 - Ginger to notify pt  . COLONOSCOPY N/A 01/07/2017   Procedure: COLONOSCOPY;  Surgeon: Daneil Dolin, MD;  [facility];  Service: Endoscopy;  Laterality: N/A;  3:15PM  . ESOPHAGEAL BANDING N/A 12/20/2016   Procedure: ESOPHAGEAL BANDING;  Surgeon: Danie Binder, MD;  [facility];  Service: Endoscopy;  Laterality: N/A;  . ESOPHAGEAL BANDING N/A 03/18/2017   Procedure: ESOPHAGEAL BANDING;  Surgeon: Daneil Dolin, MD;  [facility];  Service: Endoscopy;  Laterality: N/A;  . ESOPHAGOGASTRODUODENOSCOPY  08/2006   barretts esophagus, no dyplasia  . ESOPHAGOGASTRODUODENOSCOPY  03/2006   barretts esophagus,bx focal atypia c/w low grade dysplasia, SB bx negative for celiac  . ESOPHAGOGASTRODUODENOSCOPY  09/23/09   3 columns of grade 2 esophageal varices/hiatal hernia/3-4 cm segment Barrett's esophagus without dysplasia. Next EGD 09/2012  . ESOPHAGOGASTRODUODENOSCOPY N/A 11/11/2012   Dr. Gala Romney- Barrett;s esophagus, esophageal varices, portal gastopathy. hiatal hernia  . ESOPHAGOGASTRODUODENOSCOPY N/A 04/30/2016   Procedure: ESOPHAGOGASTRODUODENOSCOPY (EGD);  Surgeon: Daneil Dolin, MD;  [facility];  Service: Endoscopy;  Laterality: N/A;  815  . ESOPHAGOGASTRODUODENOSCOPY N/A 12/20/2016   Procedure: ESOPHAGOGASTRODUODENOSCOPY (EGD);  Surgeon: Danie Binder, MD;  [facility];  Service: Endoscopy;  Laterality: N/A;  . ESOPHAGOGASTRODUODENOSCOPY N/A 01/07/2017   Procedure: ESOPHAGOGASTRODUODENOSCOPY (EGD);  Surgeon: Daneil Dolin, MD;  [facility];  Service: Endoscopy;  Laterality: N/A;  . ESOPHAGOGASTRODUODENOSCOPY N/A 03/18/2017   Procedure: ESOPHAGOGASTRODUODENOSCOPY (EGD);  Surgeon: Daneil Dolin, MD;  [facility];   Service: Endoscopy;  Laterality: N/A;  1030   . [place]  2008  . small bowel capsule endoscopy  10/2009   normal    Current Outpatient Prescriptions  Medication Sig Dispense Refill  . Calcium Carbonate Antacid (TUMS PO) Take 2 tablets by mouth daily as needed (heart burn).    . cyanocobalamin (,VITAMIN B-12,) 1000 MCG/ML injection Inject 1,000 mcg into the muscle every 30 (thirty) days.     Marland Kitchen GLIPIZIDE XL 10 MG 24 hr tablet Take 10 mg by mouth daily.     Marland Kitchen lisinopril (PRINIVIL,ZESTRIL) 10 MG tablet Take 10 mg by mouth daily.     Marland Kitchen loperamide (IMODIUM) 2 MG capsule Take 4 mg by mouth as needed for diarrhea or loose stools.    . metFORMIN (GLUCOPHAGE) 1000 MG tablet Take 1,000 mg by mouth 2 (two) times daily with a meal.     . pantoprazole (PROTONIX) 40 MG tablet Take 1 tablet (40 mg total) by mouth 2 (two) times daily before a meal. 60 tablet 1  . propranolol (INDERAL) 10 MG tablet Take 3 tablets in the morning and 3 in the evening 180 tablet 11  . TRESIBA FLEXTOUCH 200 UNIT/ML SOPN Inject 44 Units into the skin daily.   12  . VICTOZA 18 MG/3ML SOLN Inject 1.8 mg into the skin daily.      No current facility-administered medications for this visit.     Allergies as of 05/06/2017 -  Review Complete 05/06/2017  Allergen Reaction Noted  . Codeine Nausea And Vomiting 04/19/2012    Family History  Problem Relation Age of Onset  . Alzheimer's disease Mother   . CAD Father   . Diabetes Mellitus II Father   . Alzheimer's disease Father   . Diabetes Mellitus II Sister   . Cancer - Other Brother   . Diabetes Mellitus II Brother   . Hypertension Brother   . Colon cancer Neg Hx   . Liver disease Neg Hx     Social History   Social History  . Marital status: Married    Spouse name: N/A  . Number of children: N/A  . Years of education: N/A   Social History Main Topics  . Smoking status: Never Smoker  . Smokeless tobacco: Never Used  . Alcohol use No  . Drug use: No  .  Sexual activity: Yes   Other Topics Concern  . None   Social History Narrative  . None    Review of Systems: General: Negative for anorexia, weight loss, fever, chills, fatigue, weakness. ENT: Negative for hoarseness, difficulty swallowing. CV: Negative for chest pain, angina, palpitations, peripheral edema.  Respiratory: Negative for dyspnea at rest, cough, sputum, wheezing.  GI: See history of present illness. Derm: Negative for rash or itching.  Neuro: Negative for memory loss, confusion.  Endo: Negative for unusual weight change.  Heme: Negative for bruising or bleeding. Allergy: Negative for rash or hives.   Physical Exam: BP 120/69   Pulse 84   Temp 97.6 F (36.4 C) (Oral)   Ht 5' 1"  (1.549 m)   Wt 176 lb 9.6 oz (80.1 kg)   BMI 33.37 kg/m  General:   Alert and oriented. Pleasant and cooperative. Well-nourished and well-developed.  Eyes:  Without icterus, sclera clear and conjunctiva pink.  Ears:  Normal auditory acuity. Cardiovascular:  S1, S2 present without murmurs appreciated. Extremities without clubbing or edema. Respiratory:  Clear to auscultation bilaterally. No wheezes, rales, or rhonchi. No distress.  Gastrointestinal:  +BS, rounded but soft, non-tender and non-distended. No HSM noted. No guarding or rebound. No masses appreciated.  Rectal:  Deferred  Musculoskalatal:  Symmetrical without gross deformities. Neurologic:  Alert and oriented x4;  grossly normal neurologically. Psych:  Alert and cooperative. Normal mood and affect. Heme/Lymph/Immune: No excessive bruising noted.    05/06/2017 10:12 AM   Disclaimer: This note was dictated with voice recognition software. Similar sounding words can inadvertently be transcribed and may not be corrected upon review.

## 2017-05-10 ENCOUNTER — Other Ambulatory Visit: Payer: Self-pay

## 2017-05-10 ENCOUNTER — Ambulatory Visit (HOSPITAL_COMMUNITY): Payer: Medicare Other

## 2017-05-10 ENCOUNTER — Other Ambulatory Visit (HOSPITAL_COMMUNITY): Payer: Medicare Other

## 2017-05-10 ENCOUNTER — Encounter (HOSPITAL_COMMUNITY): Payer: Self-pay

## 2017-05-10 ENCOUNTER — Encounter (HOSPITAL_COMMUNITY): Payer: Medicare Other | Attending: Hematology

## 2017-05-10 ENCOUNTER — Other Ambulatory Visit (HOSPITAL_COMMUNITY): Payer: Self-pay | Admitting: Adult Health

## 2017-05-10 ENCOUNTER — Ambulatory Visit (HOSPITAL_COMMUNITY)
Admission: RE | Admit: 2017-05-10 | Discharge: 2017-05-10 | Disposition: A | Discharge status: Home / Self Care | Payer: Medicare Other | Source: Ambulatory Visit | Attending: Nurse Practitioner | Admitting: Nurse Practitioner

## 2017-05-10 VITALS — BP 134/59 | HR 84 | Temp 98.1°F | Resp 16 | Wt 179.3 lb

## 2017-05-10 DIAGNOSIS — K769 Liver disease, unspecified: Secondary | ICD-10-CM | POA: Diagnosis not present

## 2017-05-10 DIAGNOSIS — E538 Deficiency of other specified B group vitamins: Secondary | ICD-10-CM

## 2017-05-10 DIAGNOSIS — K5730 Diverticulosis of large intestine without perforation or abscess without bleeding: Secondary | ICD-10-CM | POA: Insufficient documentation

## 2017-05-10 DIAGNOSIS — K2270 Barrett's esophagus without dysplasia: Secondary | ICD-10-CM | POA: Insufficient documentation

## 2017-05-10 DIAGNOSIS — D509 Iron deficiency anemia, unspecified: Secondary | ICD-10-CM | POA: Insufficient documentation

## 2017-05-10 DIAGNOSIS — D689 Coagulation defect, unspecified: Secondary | ICD-10-CM | POA: Insufficient documentation

## 2017-05-10 DIAGNOSIS — K7689 Other specified diseases of liver: Secondary | ICD-10-CM | POA: Insufficient documentation

## 2017-05-10 DIAGNOSIS — K760 Fatty (change of) liver, not elsewhere classified: Secondary | ICD-10-CM

## 2017-05-10 DIAGNOSIS — Z7984 Long term (current) use of oral hypoglycemic drugs: Secondary | ICD-10-CM | POA: Insufficient documentation

## 2017-05-10 DIAGNOSIS — M795 Residual foreign body in soft tissue: Secondary | ICD-10-CM | POA: Insufficient documentation

## 2017-05-10 DIAGNOSIS — Z79899 Other long term (current) drug therapy: Secondary | ICD-10-CM | POA: Insufficient documentation

## 2017-05-10 DIAGNOSIS — D696 Thrombocytopenia, unspecified: Secondary | ICD-10-CM | POA: Insufficient documentation

## 2017-05-10 DIAGNOSIS — R161 Splenomegaly, not elsewhere classified: Secondary | ICD-10-CM | POA: Insufficient documentation

## 2017-05-10 DIAGNOSIS — R935 Abnormal findings on diagnostic imaging of other abdominal regions, including retroperitoneum: Secondary | ICD-10-CM

## 2017-05-10 DIAGNOSIS — E119 Type 2 diabetes mellitus without complications: Secondary | ICD-10-CM | POA: Insufficient documentation

## 2017-05-10 DIAGNOSIS — Z01818 Encounter for other preprocedural examination: Secondary | ICD-10-CM | POA: Diagnosis present

## 2017-05-10 DIAGNOSIS — K7460 Unspecified cirrhosis of liver: Secondary | ICD-10-CM | POA: Insufficient documentation

## 2017-05-10 MED ORDER — CYANOCOBALAMIN 1000 MCG/ML IJ SOLN
1000.0000 ug | Freq: Once | INTRAMUSCULAR | Status: AC
  Administered 2017-05-10: 1000 ug via INTRAMUSCULAR

## 2017-05-10 MED ORDER — CYANOCOBALAMIN 1000 MCG/ML IJ SOLN
INTRAMUSCULAR | Status: AC
  Filled 2017-05-10: qty 1

## 2017-05-10 NOTE — Patient Instructions (Signed)
[place] at [facility] Discharge Instructions  RECOMMENDATIONS MADE BY THE CONSULTANT AND ANY TEST RESULTS WILL BE SENT TO YOUR REFERRING PHYSICIAN.  You were given your B-12 injection today Follow up next month as scheduled.  Thank you for choosing [place] at [facility] to provide your oncology and hematology care.  To afford each patient quality time with our provider, please arrive at least 15 minutes before your scheduled appointment time.    If you have a lab appointment with the [place] please come in thru the  Main Entrance and check in at the main information desk  You need to re-schedule your appointment should you arrive 10 or more minutes late.  We strive to give you quality time with our providers, and arriving late affects you and other patients whose appointments are after yours.  Also, if you no show three or more times for appointments you may be dismissed from the clinic at the providers discretion.     Again, thank you for choosing [facility].  Our hope is that these requests will decrease the amount of time that you wait before being seen by our physicians.       _____________________________________________________________  Should you have questions after your visit to [facility], please contact our office at [phone] between the hours of 8:30 a.m. and 4:30 p.m.  Voicemails left after 4:30 p.m. will not be returned until the following business day.  For prescription refill requests, have your pharmacy contact our office.       Resources For Cancer Patients and their Caregivers ? American Cancer Society: Can assist with transportation, wigs, general needs, runs Look Good Feel Better.        202-736-0907 ? Cancer Care: Provides financial assistance, online support groups, medication/co-pay assistance.  1-800-813-HOPE (514) 398-0868) ? [place] Assists  [place] cancer patients and their families through emotional , educational and financial support.  505-260-0766 ? [place] DSS Where to apply for food stamps, Medicaid and utility assistance. 463-342-2135 ? RCATS: Transportation to medical appointments. 563-049-0307 ? Social Security Administration: May apply for disability if have a Stage IV cancer. (418)028-8777 959-414-7247 ? [employer], Disability and Transit Services: Assists with nutrition, care and transit needs. [place] Support Programs: @10RELATIVEDAYS @ > Cancer Support Group  2nd Tuesday of the month 1pm-2pm, Journey Room  > Creative Journey  3rd Tuesday of the month 1130am-1pm, Journey Room  > Look Good Feel Better  1st Wednesday of the month 10am-12 noon, Journey Room (Call [place] to register [phone])

## 2017-05-10 NOTE — Patient Instructions (Signed)
Attempted to submit PA info for MRI abd w/wo contrast via [facility] website. No PA needed.

## 2017-05-10 NOTE — Progress Notes (Signed)
Alisha [name] presents today for injection per MD orders. B12 1000 mcg administered IM in left deltoid. Administration without incident. Patient tolerated treatment without incidence. Patient discharged ambulatory and in stable condition from clinic. Patient to follow up as scheduled.

## 2017-05-18 ENCOUNTER — Ambulatory Visit (HOSPITAL_COMMUNITY): Payer: Medicare Other

## 2017-06-04 ENCOUNTER — Other Ambulatory Visit (HOSPITAL_COMMUNITY)
Admission: RE | Admit: 2017-06-04 | Discharge: 2017-06-04 | Disposition: A | Discharge status: Home / Self Care | Payer: Medicare Other | Source: Ambulatory Visit | Attending: Nurse Practitioner | Admitting: Nurse Practitioner

## 2017-06-04 ENCOUNTER — Encounter (HOSPITAL_COMMUNITY): Payer: Medicare Other | Attending: Oncology

## 2017-06-04 DIAGNOSIS — E538 Deficiency of other specified B group vitamins: Secondary | ICD-10-CM | POA: Insufficient documentation

## 2017-06-04 DIAGNOSIS — K769 Liver disease, unspecified: Secondary | ICD-10-CM | POA: Diagnosis present

## 2017-06-04 DIAGNOSIS — K760 Fatty (change of) liver, not elsewhere classified: Secondary | ICD-10-CM | POA: Diagnosis present

## 2017-06-04 DIAGNOSIS — D500 Iron deficiency anemia secondary to blood loss (chronic): Secondary | ICD-10-CM | POA: Diagnosis not present

## 2017-06-04 LAB — COMPREHENSIVE METABOLIC PANEL
ALT: 26 U/L (ref 14–54)
AST: 43 U/L — ABNORMAL HIGH (ref 15–41)
Albumin: 3.9 g/dL (ref 3.5–5.0)
Alkaline Phosphatase: 58 U/L (ref 38–126)
Anion gap: 10 (ref 5–15)
BILIRUBIN TOTAL: 2.2 mg/dL — AB (ref 0.3–1.2)
BUN: 11 mg/dL (ref 6–20)
CHLORIDE: 108 mmol/L (ref 101–111)
CO2: 19 mmol/L — ABNORMAL LOW (ref 22–32)
Calcium: 9.2 mg/dL (ref 8.9–10.3)
Creatinine, Ser: 0.83 mg/dL (ref 0.44–1.00)
GFR calc Af Amer: >60 mL/min (ref >60)
Glucose, Bld: 165 mg/dL — ABNORMAL HIGH (ref 65–99)
POTASSIUM: 4.4 mmol/L (ref 3.5–5.1)
Sodium: 137 mmol/L (ref 135–145)
TOTAL PROTEIN: 7.2 g/dL (ref 6.5–8.1)

## 2017-06-04 LAB — VITAMIN B12: Vitamin B-12: 438 pg/mL (ref 180–914)

## 2017-06-04 LAB — CBC WITH DIFFERENTIAL/PLATELET
Basophils Absolute: 0 10*3/uL (ref 0.0–0.1)
Basophils Relative: 0 %
EOS PCT: 7 %
Eosinophils Absolute: 0.3 10*3/uL (ref 0.0–0.7)
HEMATOCRIT: 34.4 % — AB (ref 36.0–46.0)
Hemoglobin: 11.2 g/dL — ABNORMAL LOW (ref 12.0–15.0)
LYMPHS ABS: 1.2 10*3/uL (ref 0.7–4.0)
LYMPHS PCT: 30 %
MCH: 27.3 pg (ref 26.0–34.0)
MCHC: 32.6 g/dL (ref 30.0–36.0)
MCV: 83.7 fL (ref 78.0–100.0)
MONO ABS: 0.3 10*3/uL (ref 0.1–1.0)
MONOS PCT: 8 %
Neutro Abs: 2.2 10*3/uL (ref 1.7–7.7)
Neutrophils Relative %: 56 %
PLATELETS: 52 10*3/uL — AB (ref 150–400)
RBC: 4.11 MIL/uL (ref 3.87–5.11)
RDW: 15.1 % (ref 11.5–15.5)
WBC: 4 10*3/uL (ref 4.0–10.5)

## 2017-06-04 LAB — IRON AND TIBC
IRON: 57 ug/dL (ref 28–170)
Saturation Ratios: 13 % (ref 10.4–31.8)
TIBC: 452 ug/dL — AB (ref 250–450)
UIBC: 395 ug/dL

## 2017-06-04 LAB — FERRITIN: FERRITIN: 10 ng/mL — AB (ref 11–307)

## 2017-06-05 LAB — AFP TUMOR MARKER: AFP, SERUM, TUMOR MARKER: 4.5 ng/mL (ref 0.0–8.3)

## 2017-06-07 ENCOUNTER — Ambulatory Visit (HOSPITAL_COMMUNITY): Payer: Medicare Other

## 2017-06-07 ENCOUNTER — Other Ambulatory Visit (HOSPITAL_COMMUNITY): Payer: Medicare Other

## 2017-06-08 LAB — METHYLMALONIC ACID, SERUM: Methylmalonic Acid, Quantitative: 116 nmol/L (ref 0–378)

## 2017-06-14 ENCOUNTER — Other Ambulatory Visit (HOSPITAL_COMMUNITY): Payer: Medicare Other

## 2017-06-14 ENCOUNTER — Encounter (HOSPITAL_COMMUNITY): Payer: Medicare Other | Attending: Hematology | Admitting: Oncology

## 2017-06-14 ENCOUNTER — Encounter (HOSPITAL_COMMUNITY): Payer: Self-pay | Admitting: Oncology

## 2017-06-14 ENCOUNTER — Encounter (HOSPITAL_BASED_OUTPATIENT_CLINIC_OR_DEPARTMENT_OTHER): Payer: Medicare Other

## 2017-06-14 VITALS — BP 130/63 | HR 79 | Temp 98.3°F | Resp 18 | Wt 182.4 lb

## 2017-06-14 DIAGNOSIS — R161 Splenomegaly, not elsewhere classified: Secondary | ICD-10-CM | POA: Insufficient documentation

## 2017-06-14 DIAGNOSIS — E119 Type 2 diabetes mellitus without complications: Secondary | ICD-10-CM | POA: Insufficient documentation

## 2017-06-14 DIAGNOSIS — K2270 Barrett's esophagus without dysplasia: Secondary | ICD-10-CM | POA: Insufficient documentation

## 2017-06-14 DIAGNOSIS — D508 Other iron deficiency anemias: Secondary | ICD-10-CM

## 2017-06-14 DIAGNOSIS — D696 Thrombocytopenia, unspecified: Secondary | ICD-10-CM | POA: Insufficient documentation

## 2017-06-14 DIAGNOSIS — E538 Deficiency of other specified B group vitamins: Secondary | ICD-10-CM | POA: Diagnosis not present

## 2017-06-14 DIAGNOSIS — D509 Iron deficiency anemia, unspecified: Secondary | ICD-10-CM | POA: Insufficient documentation

## 2017-06-14 DIAGNOSIS — D500 Iron deficiency anemia secondary to blood loss (chronic): Secondary | ICD-10-CM | POA: Diagnosis not present

## 2017-06-14 DIAGNOSIS — K7460 Unspecified cirrhosis of liver: Secondary | ICD-10-CM | POA: Insufficient documentation

## 2017-06-14 DIAGNOSIS — K5730 Diverticulosis of large intestine without perforation or abscess without bleeding: Secondary | ICD-10-CM | POA: Insufficient documentation

## 2017-06-14 DIAGNOSIS — D689 Coagulation defect, unspecified: Secondary | ICD-10-CM | POA: Insufficient documentation

## 2017-06-14 DIAGNOSIS — Z79899 Other long term (current) drug therapy: Secondary | ICD-10-CM | POA: Insufficient documentation

## 2017-06-14 DIAGNOSIS — Z7984 Long term (current) use of oral hypoglycemic drugs: Secondary | ICD-10-CM | POA: Insufficient documentation

## 2017-06-14 DIAGNOSIS — K7689 Other specified diseases of liver: Secondary | ICD-10-CM | POA: Insufficient documentation

## 2017-06-14 MED ORDER — CYANOCOBALAMIN 1000 MCG/ML IJ SOLN
1000.0000 ug | Freq: Once | INTRAMUSCULAR | Status: AC
  Administered 2017-06-14: 1000 ug via INTRAMUSCULAR
  Filled 2017-06-14: qty 1

## 2017-06-14 NOTE — Patient Instructions (Signed)
[place] at [facility] Discharge Instructions  RECOMMENDATIONS MADE BY THE CONSULTANT AND ANY TEST RESULTS WILL BE SENT TO YOUR REFERRING PHYSICIAN.  You were seen by Dr. Talbert Cage today  Thank you for choosing [place] at [facility] to provide your oncology and hematology care.  To afford each patient quality time with our provider, please arrive at least 15 minutes before your scheduled appointment time.    If you have a lab appointment with the [place] please come in thru the  Main Entrance and check in at the main information desk  You need to re-schedule your appointment should you arrive 10 or more minutes late.  We strive to give you quality time with our providers, and arriving late affects you and other patients whose appointments are after yours.  Also, if you no show three or more times for appointments you may be dismissed from the clinic at the providers discretion.     Again, thank you for choosing [facility].  Our hope is that these requests will decrease the amount of time that you wait before being seen by our physicians.       _____________________________________________________________  Should you have questions after your visit to [facility], please contact our office at [phone] between the hours of 8:30 a.m. and 4:30 p.m.  Voicemails left after 4:30 p.m. will not be returned until the following business day.  For prescription refill requests, have your pharmacy contact our office.       Resources For Cancer Patients and their Caregivers ? American Cancer Society: Can assist with transportation, wigs, general needs, runs Look Good Feel Better.        402-847-0578 ? Cancer Care: Provides financial assistance, online support groups, medication/co-pay assistance.  1-800-813-HOPE 586 409 9851) ? [place] Assists [place] cancer patients and their families  through emotional , educational and financial support.  (628)469-0397 ? [place] DSS Where to apply for food stamps, Medicaid and utility assistance. 517-678-8405 ? RCATS: Transportation to medical appointments. 616 089 9101 ? Social Security Administration: May apply for disability if have a Stage IV cancer. 737-840-2979 505-542-7855 ? [employer], Disability and Transit Services: Assists with nutrition, care and transit needs. [place] Support Programs: @10RELATIVEDAYS @ > Cancer Support Group  2nd Tuesday of the month 1pm-2pm, Journey Room  > Creative Journey  3rd Tuesday of the month 1130am-1pm, Journey Room  > Look Good Feel Better  1st Wednesday of the month 10am-12 noon, Journey Room (Call [place] to register [phone])

## 2017-06-14 NOTE — Progress Notes (Signed)
Alisha [name] presents today for injection per MD orders. B12 1,069mg administered IM  in right Upper Arm. Administration without incident. Patient tolerated well.

## 2017-06-14 NOTE — Progress Notes (Signed)
HEMATOLOGY/ONCOLOGY PROGRESS NOTE  Date of Service: 06/14/2017  Patient Care Team: Glenda Chroman, MD as PCP - General (Internal Medicine) Gala Romney Cristopher Estimable, MD (Gastroenterology)  CHIEF COMPLAINTS/PURPOSE OF CONSULTATION:  Anemia  HISTORY OF PRESENTING ILLNESS:  Alisha [name] is a wonderful 66 y.o. female who returns for management of anemia and thrombocytopenia  Patient was hospitalized in April 2018 for hemoptysis. She was found to have a bleeding esophageal varices and had 7 bands placed in the hospital and another 6 bands placed in the outpatient setting by Dr. Gala Romney. She was not given a blood transfusion while she was inpatient, her hemoglobin stays stable in the 8 g/dl range. She had a repeat EGD and colonoscopy on 01/09/17 with results that shown below. Patient has not had any repeat episodes of hemoptysis and she states that she feels better today than she did prior to going to the hospital. CBC performed today demonstrated her hemoglobin is up to 10.9 g/dl, platelet count 46K. she is having some mild diarrhea with about 2-3 bowel movements a day. She states she has diarrhea right after she eats. Otherwise she denies any chest pain, shortness breath, abdominal pain, easy bruising, or any new bleeding issues.  INTERVAL HISTORY: Patient presents today for continued follow up. Patient states that she has been doing well and had not had any recent GI bleeding. She denies any fatigue, abdominal pain, chest pain, shortness breath, recent infections. She denies any changes in her bowel habits. Patient's last EGD performed on 03/18/2017 demonstrated varices found in the mid esophagus and distal esophagus. 6 bands were placed with incomplete eradication of varices. There was no bleeding at the end the procedure. Moderate portal hypertensive gastropathy found in the entire stomach.  Most recent CBC from 06/04/2017 demonstrated hemoglobin of 11.2 g/dL, hematocrit 34.4%, platelet count 52K.  Ferritin on 06/04/2017 was 10. She has been on monthly vitamin B12 injections with improvement of her B12 level up to 438.    MEDICAL HISTORY:  Past Medical History:  Diagnosis Date  . B12 deficiency 11/03/2016  . B12 deficiency 11/03/2016  . Barrett's esophagus   . Cirrhosis of liver without mention of alcohol    hep B surface antigen and HCV ab negative.pt has not had hepatitis A and B vaccines.U/S on 07/04/13 shows cirrhosis.  . Diverticula of colon    pancolonic  . Esophageal reflux   . Esophageal varices ([place])   . Hiatal hernia   . Iron deficiency anemia, unspecified   . Other and unspecified hyperlipidemia   . Portal hypertensive gastropathy ([place])   . Tubular adenoma   . Type II or unspecified type diabetes mellitus without mention of complication, not stated as uncontrolled   . Unspecified essential hypertension   . Unspecified hemorrhoids without mention of complication     SURGICAL HISTORY: Past Surgical History:  Procedure Laterality Date  . ABDOMINAL HYSTERECTOMY    . BIOPSY  04/30/2016   Procedure: BIOPSY;  Surgeon: Daneil Dolin, MD;  [facility];  Service: Endoscopy;;  esophagus  . CESAREAN SECTION     x 2  . COLONOSCOPY  03/2006   left sided diverticula, splenic flexure tublar adenoma  . COLONOSCOPY  07/2002   villous tubular adenoma in rectum  . COLONOSCOPY  09/23/09   external hemorrhoids/scattered pan colonic diverticula otherwise normal. cecal lipoma bx negative, normal TI. Next TCS 09/2014  . COLONOSCOPY N/A 11/12/2014   Procedure: COLONOSCOPY;  Surgeon: Daneil Dolin, MD;  [facility];  Service: Endoscopy;  Laterality: N/A;  1215 - moved to 12:30 - Ginger to notify pt  . COLONOSCOPY N/A 01/07/2017   Procedure: COLONOSCOPY;  Surgeon: Daneil Dolin, MD;  [facility];  Service: Endoscopy;  Laterality: N/A;  3:15PM  . ESOPHAGEAL BANDING N/A 12/20/2016   Procedure: ESOPHAGEAL BANDING;  Surgeon: Danie Binder, MD;  [facility]  [facility];  Service: Endoscopy;  Laterality: N/A;  . ESOPHAGEAL BANDING N/A 03/18/2017   Procedure: ESOPHAGEAL BANDING;  Surgeon: Daneil Dolin, MD;  [facility];  Service: Endoscopy;  Laterality: N/A;  . ESOPHAGOGASTRODUODENOSCOPY  08/2006   barretts esophagus, no dyplasia  . ESOPHAGOGASTRODUODENOSCOPY  03/2006   barretts esophagus,bx focal atypia c/w low grade dysplasia, SB bx negative for celiac  . ESOPHAGOGASTRODUODENOSCOPY  09/23/09   3 columns of grade 2 esophageal varices/hiatal hernia/3-4 cm segment Barrett's esophagus without dysplasia. Next EGD 09/2012  . ESOPHAGOGASTRODUODENOSCOPY N/A 11/11/2012   Dr. Gala Romney- Barrett;s esophagus, esophageal varices, portal gastopathy. hiatal hernia  . ESOPHAGOGASTRODUODENOSCOPY N/A 04/30/2016   Procedure: ESOPHAGOGASTRODUODENOSCOPY (EGD);  Surgeon: Daneil Dolin, MD;  [facility];  Service: Endoscopy;  Laterality: N/A;  815  . ESOPHAGOGASTRODUODENOSCOPY N/A 12/20/2016   Procedure: ESOPHAGOGASTRODUODENOSCOPY (EGD);  Surgeon: Danie Binder, MD;  [facility];  Service: Endoscopy;  Laterality: N/A;  . ESOPHAGOGASTRODUODENOSCOPY N/A 01/07/2017   Procedure: ESOPHAGOGASTRODUODENOSCOPY (EGD);  Surgeon: Daneil Dolin, MD;  [facility];  Service: Endoscopy;  Laterality: N/A;  . ESOPHAGOGASTRODUODENOSCOPY N/A 03/18/2017   Procedure: ESOPHAGOGASTRODUODENOSCOPY (EGD);  Surgeon: Daneil Dolin, MD;  [facility];  Service: Endoscopy;  Laterality: N/A;  1030   . [place]  2008  . small bowel capsule endoscopy  10/2009   normal    SOCIAL HISTORY: Social History   Social History  . Marital status: Married    Spouse name: N/A  . Number of children: N/A  . Years of education: N/A   Occupational History  . Not on file.   Social History Main Topics  . Smoking status: Never Smoker  . Smokeless tobacco: Never Used  . Alcohol use No  . Drug use: No  . Sexual activity: Yes   Other Topics  Concern  . Not on file   Social History Narrative  . No narrative on file    FAMILY HISTORY: Family History  Problem Relation Age of Onset  . Alzheimer's disease Mother   . CAD Father   . Diabetes Mellitus II Father   . Alzheimer's disease Father   . Diabetes Mellitus II Sister   . Cancer - Other Brother   . Diabetes Mellitus II Brother   . Hypertension Brother   . Colon cancer Neg Hx   . Liver disease Neg Hx     ALLERGIES:  is allergic to codeine.  MEDICATIONS:  Current Outpatient Prescriptions  Medication Sig Dispense Refill  . Calcium Carbonate Antacid (TUMS PO) Take 2 tablets by mouth daily as needed (heart burn).    . cyanocobalamin (,VITAMIN B-12,) 1000 MCG/ML injection Inject 1,000 mcg into the muscle every 30 (thirty) days.     Marland Kitchen GLIPIZIDE XL 10 MG 24 hr tablet Take 10 mg by mouth daily.     Marland Kitchen lisinopril (PRINIVIL,ZESTRIL) 10 MG tablet Take 10 mg by mouth daily.     Marland Kitchen loperamide (IMODIUM) 2 MG capsule Take 4 mg by mouth as needed for diarrhea or loose stools.    . metFORMIN (GLUCOPHAGE) 1000  MG tablet Take 1,000 mg by mouth 2 (two) times daily with a meal.     . pantoprazole (PROTONIX) 40 MG tablet Take 1 tablet (40 mg total) by mouth 2 (two) times daily before a meal. 60 tablet 1  . propranolol (INDERAL) 10 MG tablet Take 3 tablets in the morning and 3 in the evening 180 tablet 11  . TRESIBA FLEXTOUCH 200 UNIT/ML SOPN Inject 44 Units into the skin daily.   12  . VICTOZA 18 MG/3ML SOLN Inject 1.8 mg into the skin daily.      No current facility-administered medications for this visit.     REVIEW OF SYSTEMS:   Review of Systems  Constitutional: Negative.  Negative for malaise/fatigue.       No pica  HENT: Negative.   Eyes: Negative.   Respiratory: Negative.   Cardiovascular: Negative.  Negative for leg swelling.  Gastrointestinal: Negative.  Negative for abdominal pain, blood in stool and melena.  Genitourinary: Negative.  Negative for hematuria.    Musculoskeletal: Negative.   Skin: Negative.   Neurological: Negative.  Negative for tingling.  Endo/Heme/Allergies: Negative.  Does not bruise/bleed easily.  Psychiatric/Behavioral: Negative.   All other systems reviewed and are negative. 14 Point review of Systems was done is negative except as noted above.  PHYSICAL EXAMINATION: ECOG PERFORMANCE STATUS: 1 - Symptomatic but completely ambulatory Vitals:   06/14/17 1350  BP: 130/63  Pulse: 79  Resp: 18  Temp: 98.3 F (36.8 C)  SpO2: 98%    Physical Exam  Constitutional: She is oriented to person, place, and time and well-developed, well-nourished, and in no distress. No distress.  HENT:  Head: Normocephalic and atraumatic.  Mouth/Throat: No oropharyngeal exudate.  Eyes: Pupils are equal, round, and reactive to light. Conjunctivae and EOM are normal. No scleral icterus.  Neck: Normal range of motion. Neck supple. No JVD present.  Cardiovascular: Normal rate, regular rhythm and normal heart sounds.  Exam reveals no gallop and no friction rub.   No murmur heard. Pulmonary/Chest: Effort normal and breath sounds normal. No respiratory distress. She has no wheezes. She has no rales.  Abdominal: Soft. Bowel sounds are normal. She exhibits no distension. There is no tenderness. There is no guarding.  Musculoskeletal: Normal range of motion. She exhibits no edema or tenderness.  Lymphadenopathy:    She has no cervical adenopathy.  Neurological: She is alert and oriented to person, place, and time. No cranial nerve deficit. Gait normal.  Skin: Skin is warm and dry. No rash noted. No erythema. No pallor.  Psychiatric: Affect and judgment normal.  Nursing note and vitals reviewed.  LABORATORY DATA:  I have reviewed the data as listed  CBC Latest Ref Rng & Units 06/04/2017 02/02/2017 12/24/2016  WBC 4.0 - 10.5 K/uL 4.0 2.8(L) 3.1(L)  Hemoglobin 12.0 - 15.0 g/dL 11.2(L) 10.9(L) 8.1(L)  Hematocrit 36.0 - 46.0 % 34.4(L) 33.1(L) 24.1(L)   Platelets 150 - 400 K/uL 52(L) 49(L) 62(L)    . CMP Latest Ref Rng & Units 06/04/2017 02/02/2017 12/23/2016  Glucose 65 - 99 mg/dL 165(H) 335(H) 189(H)  BUN 6 - 20 mg/dL 11 9 <5(L)  Creatinine 0.44 - 1.00 mg/dL 0.83 0.74 0.68  Sodium 135 - 145 mmol/L 137 133(L) 137  Potassium 3.5 - 5.1 mmol/L 4.4 4.4 3.6  Chloride 101 - 111 mmol/L 108 104 109  CO2 22 - 32 mmol/L 19(L) 20(L) 23  Calcium 8.9 - 10.3 mg/dL 9.2 9.0 8.1(L)  Total Protein 6.5 - 8.1 g/dL 7.2  6.4(L) -  Total Bilirubin 0.3 - 1.2 mg/dL 2.2(H) 1.8(H) -  Alkaline Phos 38 - 126 U/L 58 53 -  AST 15 - 41 U/L 43(H) 42(H) -  ALT 14 - 54 U/L 26 23 -    PATHOLOGY:   Colonoscopy 01/09/17: - Internal hemorrhoids that do not return to the anal canal, thus continuously prolapsed (Grade IV) found on perianal exam. - One 7 mm polyp in the cecum, removed with a hot snare. Resected and retrieved. - Diverticulosis. Congested mucosa. Focally ulcerated segment of sigmoid mucosa?status post biopsy - The examination was otherwise normal on direct and retroflexion views. I wonder about an element of ischemic colitis and not just diverticulitis. Diagnosis 1. Colon, polyp(s), cecal - TUBULAR ADENOMA(S). - HIGH GRADE DYSPLASIA IS NOT IDENTIFIED. 2. Colon, biopsy, sigmoid - BENIGN COLONIC MUCOSA. - NO PATHOLOGICAL ALTERATIONS.  EGD 01/09/17 Esophageal mucosal changes consistent with short-segment Barrett's esophagus. - Grade III esophageal varices. Incompletely eradicated. Banded. - Small hiatal hernia. - Portal hypertensive gastropathy. - Normal duodenal bulb and second portion of the duodenum. - No specimens collected.    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 Thrombocytopenia stable, continue to monitor.   Hemoglobin is 11.2 g/dL, however she is iron deficient with a ferritin of 10. I will set her up for 2 doses of feraheme ASAP.  Continue follow up with GI. She has an appointment with [facility] team in the upcoming weeks for evaluation.  She will return for follow up in 3 months with labs; CBC, CMP, iron studies, B12 level.   All of the patients questions were answered with apparent satisfaction. The patient knows to call the clinic with any problems, questions or concerns.   Twana First, MD  06/14/2017 2:00 PM

## 2017-06-14 NOTE — Patient Instructions (Signed)
Received fax from Lubertha Sayres, [place] ([facility] Nurse Clinician). They would like for pt to see their transplant team for full transplant evaluation. They contacted pt and she requested to see them 07/19/17-07/21/17.

## 2017-06-18 ENCOUNTER — Encounter (HOSPITAL_COMMUNITY): Payer: Medicare Other | Attending: Oncology

## 2017-06-18 ENCOUNTER — Encounter (HOSPITAL_COMMUNITY): Payer: Self-pay

## 2017-06-18 VITALS — BP 119/56 | HR 78 | Temp 98.0°F | Resp 18

## 2017-06-18 DIAGNOSIS — E538 Deficiency of other specified B group vitamins: Secondary | ICD-10-CM

## 2017-06-18 DIAGNOSIS — D500 Iron deficiency anemia secondary to blood loss (chronic): Secondary | ICD-10-CM | POA: Diagnosis not present

## 2017-06-18 DIAGNOSIS — D508 Other iron deficiency anemias: Secondary | ICD-10-CM

## 2017-06-18 DIAGNOSIS — D696 Thrombocytopenia, unspecified: Secondary | ICD-10-CM

## 2017-06-18 MED ORDER — SODIUM CHLORIDE 0.9 % IV SOLN
510.0000 mg | Freq: Once | INTRAVENOUS | Status: AC
  Administered 2017-06-18: 510 mg via INTRAVENOUS
  Filled 2017-06-18: qty 17

## 2017-06-18 MED ORDER — SODIUM CHLORIDE 0.9% FLUSH
10.0000 mL | Freq: Once | INTRAVENOUS | Status: AC
  Administered 2017-06-18: 10 mL via INTRAVENOUS

## 2017-06-18 MED ORDER — SODIUM CHLORIDE 0.9 % IV SOLN
INTRAVENOUS | Status: DC
  Administered 2017-06-18: 500 mL via INTRAVENOUS

## 2017-06-18 NOTE — Patient Instructions (Signed)
[facility] at [facility]  Discharge Instructions:  You received an iron infusion today.  _______________________________________________________________  Thank you for choosing [facility] at [facility] to provide your oncology and hematology care.  To afford each patient quality time with our providers, please arrive at least 15 minutes before your scheduled appointment.  You need to re-schedule your appointment if you arrive 10 or more minutes late.  We strive to give you quality time with our providers, and arriving late affects you and other patients whose appointments are after yours.  Also, if you no show three or more times for appointments you may be dismissed from the clinic.  Again, thank you for choosing  [facility] at [facility]. Our hope is that these requests will allow you access to exceptional care and in a timely manner. _______________________________________________________________  If you have questions after your visit, please contact our office at [phone] between the hours of 8:30 a.m. and 5:00 p.m. Voicemails left after 4:30 p.m. will not be returned until the following business day. _______________________________________________________________  For prescription refill requests, have your pharmacy contact our office. _______________________________________________________________  Recommendations made by the consultant and any test results will be sent to your referring physician. _______________________________________________________________ 

## 2017-06-18 NOTE — Progress Notes (Signed)
Patient tolerated iron infusion with no complaints voiced.  Right arm IV site clean and dry with no bruising or swelling noted at site.  Good blood return noted before and after administration of iron. VSS with discharge and left ambulatory with husband.  No s/s of distress.

## 2017-06-25 ENCOUNTER — Encounter (HOSPITAL_COMMUNITY): Payer: Self-pay

## 2017-06-25 ENCOUNTER — Encounter (HOSPITAL_BASED_OUTPATIENT_CLINIC_OR_DEPARTMENT_OTHER): Payer: Medicare Other

## 2017-06-25 VITALS — BP 113/53 | HR 77 | Temp 97.5°F | Resp 18

## 2017-06-25 DIAGNOSIS — D500 Iron deficiency anemia secondary to blood loss (chronic): Secondary | ICD-10-CM

## 2017-06-25 DIAGNOSIS — D508 Other iron deficiency anemias: Secondary | ICD-10-CM

## 2017-06-25 DIAGNOSIS — E538 Deficiency of other specified B group vitamins: Secondary | ICD-10-CM

## 2017-06-25 MED ORDER — SODIUM CHLORIDE 0.9% FLUSH
10.0000 mL | Freq: Once | INTRAVENOUS | Status: AC
  Administered 2017-06-25: 10 mL via INTRAVENOUS

## 2017-06-25 MED ORDER — SODIUM CHLORIDE 0.9 % IV SOLN
510.0000 mg | Freq: Once | INTRAVENOUS | Status: AC
  Administered 2017-06-25: 510 mg via INTRAVENOUS
  Filled 2017-06-25: qty 17

## 2017-06-25 MED ORDER — SODIUM CHLORIDE 0.9 % IV SOLN
INTRAVENOUS | Status: DC
  Administered 2017-06-25: 14:00:00 via INTRAVENOUS

## 2017-06-25 NOTE — Patient Instructions (Signed)
Alisha [name] at [facility]  Discharge Instructions:  You received an iron infusion today.  Keep all scheduled appointments and call for any questions or concerns.  _______________________________________________________________  Thank you for choosing [place] at [facility] to provide your oncology and hematology care.  To afford each patient quality time with our providers, please arrive at least 15 minutes before your scheduled appointment.  You need to re-schedule your appointment if you arrive 10 or more minutes late.  We strive to give you quality time with our providers, and arriving late affects you and other patients whose appointments are after yours.  Also, if you no show three or more times for appointments you may be dismissed from the clinic.  Again, thank you for choosing [place] [facility] [place] hope is that these requests will allow you access to exceptional care and in a timely manner. _______________________________________________________________  If you have questions after your visit, please contact our office at [phone] between the hours of 8:30 a.m. and 5:00 p.m. Voicemails left after 4:30 p.m. will not be returned until the following business day. _______________________________________________________________  For prescription refill requests, have your pharmacy contact our office. _______________________________________________________________  Recommendations made by the consultant and any test results will be sent to your referring physician. _______________________________________________________________

## 2017-06-25 NOTE — Progress Notes (Signed)
Patient tolerated iron infusion with no complaints voiced.  Good blood return noted before and after administration of iron.  Band aid applied with no bruising or swelling noted at site.  VSS with discharge and left with family ambulatory. No s/s of distress noted.

## 2017-07-15 ENCOUNTER — Encounter (HOSPITAL_COMMUNITY): Payer: Medicare Other | Attending: Hematology

## 2017-07-15 ENCOUNTER — Encounter (HOSPITAL_COMMUNITY): Payer: Self-pay

## 2017-07-15 VITALS — BP 128/60 | HR 77 | Temp 98.6°F | Resp 16

## 2017-07-15 DIAGNOSIS — K7460 Unspecified cirrhosis of liver: Secondary | ICD-10-CM | POA: Insufficient documentation

## 2017-07-15 DIAGNOSIS — E119 Type 2 diabetes mellitus without complications: Secondary | ICD-10-CM | POA: Insufficient documentation

## 2017-07-15 DIAGNOSIS — D509 Iron deficiency anemia, unspecified: Secondary | ICD-10-CM | POA: Insufficient documentation

## 2017-07-15 DIAGNOSIS — Z79899 Other long term (current) drug therapy: Secondary | ICD-10-CM | POA: Insufficient documentation

## 2017-07-15 DIAGNOSIS — K2270 Barrett's esophagus without dysplasia: Secondary | ICD-10-CM | POA: Insufficient documentation

## 2017-07-15 DIAGNOSIS — Z7984 Long term (current) use of oral hypoglycemic drugs: Secondary | ICD-10-CM | POA: Insufficient documentation

## 2017-07-15 DIAGNOSIS — E538 Deficiency of other specified B group vitamins: Secondary | ICD-10-CM

## 2017-07-15 DIAGNOSIS — K5730 Diverticulosis of large intestine without perforation or abscess without bleeding: Secondary | ICD-10-CM | POA: Insufficient documentation

## 2017-07-15 DIAGNOSIS — R161 Splenomegaly, not elsewhere classified: Secondary | ICD-10-CM | POA: Insufficient documentation

## 2017-07-15 DIAGNOSIS — D689 Coagulation defect, unspecified: Secondary | ICD-10-CM | POA: Insufficient documentation

## 2017-07-15 DIAGNOSIS — K7689 Other specified diseases of liver: Secondary | ICD-10-CM | POA: Insufficient documentation

## 2017-07-15 DIAGNOSIS — D696 Thrombocytopenia, unspecified: Secondary | ICD-10-CM | POA: Insufficient documentation

## 2017-07-15 MED ORDER — CYANOCOBALAMIN 1000 MCG/ML IJ SOLN
1000.0000 ug | Freq: Once | INTRAMUSCULAR | Status: AC
  Administered 2017-07-15: 1000 ug via INTRAMUSCULAR

## 2017-07-15 MED ORDER — CYANOCOBALAMIN 1000 MCG/ML IJ SOLN
INTRAMUSCULAR | Status: AC
  Filled 2017-07-15: qty 1

## 2017-07-15 NOTE — Progress Notes (Signed)
Alisha [name] presents today for injection per MD orders. B12 1,01mg administered IM  in right Upper Arm. Administration without incident. Patient tolerated well.  Treatment given per orders. Patient tolerated it well without problems. Vitals stable and discharged home from clinic ambulatory. Follow up as scheduled.

## 2017-07-15 NOTE — Patient Instructions (Signed)
[place] at [facility] Discharge Instructions  RECOMMENDATIONS MADE BY THE CONSULTANT AND ANY TEST RESULTS WILL BE SENT TO YOUR REFERRING PHYSICIAN.  b12 injection given today  Follow up as scheduled.  Thank you for choosing [place] at [facility] to provide your oncology and hematology care.  To afford each patient quality time with our provider, please arrive at least 15 minutes before your scheduled appointment time.    If you have a lab appointment with the [facility] please come in thru the  Main Entrance and check in at the main information desk  You need to re-schedule your appointment should you arrive 10 or more minutes late.  We strive to give you quality time with our providers, and arriving late affects you and other patients whose appointments are after yours.  Also, if you no show three or more times for appointments you may be dismissed from the clinic at the providers discretion.     Again, thank you for choosing [facility].  Our hope is that these requests will decrease the amount of time that you wait before being seen by our physicians.       _____________________________________________________________  Should you have questions after your visit to [facility], please contact our office at [phone] between the hours of 8:30 a.m. and 4:30 p.m.  Voicemails left after 4:30 p.m. will not be returned until the following business day.  For prescription refill requests, have your pharmacy contact our office.       Resources For Cancer Patients and their Caregivers ? American Cancer Society: Can assist with transportation, wigs, general needs, runs Look Good Feel Better.        915-242-4614 ? Cancer Care: Provides financial assistance, online support groups, medication/co-pay assistance.  1-800-813-HOPE (435) 874-9843) ? [place] Assists [place] cancer patients  and their families through emotional , educational and financial support.  847-656-3972 ? [place] DSS Where to apply for food stamps, Medicaid and utility assistance. (321)597-2891 ? [facility]: Transportation to medical appointments. 860-377-2112 ? Social Security Administration: May apply for disability if have a Stage IV cancer. (332)392-0987 201-130-2950 ? [employer], Disability and Transit Services: Assists with nutrition, care and transit needs. [place] Support Programs: @10RELATIVEDAYS @ > Cancer Support Group  2nd Tuesday of the month 1pm-2pm, Journey Room  > Creative Journey  3rd Tuesday of the month 1130am-1pm, Journey Room  > Look Good Feel Better  1st Wednesday of the month 10am-12 noon, Journey Room (Call [place] to register [phone])

## 2017-08-16 ENCOUNTER — Encounter (HOSPITAL_COMMUNITY): Payer: Medicare Other | Attending: Hematology

## 2017-08-16 ENCOUNTER — Other Ambulatory Visit: Payer: Self-pay

## 2017-08-16 ENCOUNTER — Encounter (HOSPITAL_COMMUNITY): Payer: Self-pay

## 2017-08-16 VITALS — BP 121/58 | HR 76 | Temp 97.8°F | Resp 20

## 2017-08-16 DIAGNOSIS — Z79899 Other long term (current) drug therapy: Secondary | ICD-10-CM | POA: Insufficient documentation

## 2017-08-16 DIAGNOSIS — D696 Thrombocytopenia, unspecified: Secondary | ICD-10-CM | POA: Insufficient documentation

## 2017-08-16 DIAGNOSIS — K7460 Unspecified cirrhosis of liver: Secondary | ICD-10-CM | POA: Insufficient documentation

## 2017-08-16 DIAGNOSIS — E538 Deficiency of other specified B group vitamins: Secondary | ICD-10-CM | POA: Diagnosis not present

## 2017-08-16 DIAGNOSIS — E119 Type 2 diabetes mellitus without complications: Secondary | ICD-10-CM | POA: Insufficient documentation

## 2017-08-16 DIAGNOSIS — D689 Coagulation defect, unspecified: Secondary | ICD-10-CM | POA: Insufficient documentation

## 2017-08-16 DIAGNOSIS — Z7984 Long term (current) use of oral hypoglycemic drugs: Secondary | ICD-10-CM | POA: Insufficient documentation

## 2017-08-16 DIAGNOSIS — R161 Splenomegaly, not elsewhere classified: Secondary | ICD-10-CM | POA: Insufficient documentation

## 2017-08-16 DIAGNOSIS — D509 Iron deficiency anemia, unspecified: Secondary | ICD-10-CM | POA: Insufficient documentation

## 2017-08-16 DIAGNOSIS — K5730 Diverticulosis of large intestine without perforation or abscess without bleeding: Secondary | ICD-10-CM | POA: Insufficient documentation

## 2017-08-16 DIAGNOSIS — K2270 Barrett's esophagus without dysplasia: Secondary | ICD-10-CM | POA: Insufficient documentation

## 2017-08-16 DIAGNOSIS — K7689 Other specified diseases of liver: Secondary | ICD-10-CM | POA: Insufficient documentation

## 2017-08-16 MED ORDER — CYANOCOBALAMIN 1000 MCG/ML IJ SOLN
1000.0000 ug | Freq: Once | INTRAMUSCULAR | Status: AC
  Administered 2017-08-16: 1000 ug via INTRAMUSCULAR

## 2017-08-16 MED ORDER — CYANOCOBALAMIN 1000 MCG/ML IJ SOLN
INTRAMUSCULAR | Status: AC
  Filled 2017-08-16: qty 1

## 2017-08-16 NOTE — Progress Notes (Signed)
Alisha [name] presents today for injection per MD orders. B12 1072mg administered IM in right deltoid. Administration without incident. Patient tolerated well.

## 2017-08-26 ENCOUNTER — Encounter: Payer: Self-pay | Admitting: Internal Medicine

## 2017-09-17 ENCOUNTER — Telehealth: Payer: Self-pay | Admitting: Internal Medicine

## 2017-09-17 ENCOUNTER — Ambulatory Visit (HOSPITAL_COMMUNITY): Payer: Medicare Other

## 2017-09-17 ENCOUNTER — Other Ambulatory Visit (HOSPITAL_COMMUNITY): Payer: Medicare Other

## 2017-09-17 ENCOUNTER — Other Ambulatory Visit (HOSPITAL_COMMUNITY): Payer: Self-pay | Admitting: *Deleted

## 2017-09-17 DIAGNOSIS — D696 Thrombocytopenia, unspecified: Secondary | ICD-10-CM

## 2017-09-17 NOTE — Telephone Encounter (Signed)
PLEASE CALL PATIENT, SHE STATED THAT SHE NEEDS A PRESCRIPTION FOR HEPATITIS  SHOTS [phone]

## 2017-09-20 ENCOUNTER — Other Ambulatory Visit: Payer: Self-pay

## 2017-09-20 ENCOUNTER — Inpatient Hospital Stay (HOSPITAL_COMMUNITY): Payer: Medicare Other | Admitting: Oncology

## 2017-09-20 ENCOUNTER — Inpatient Hospital Stay (HOSPITAL_COMMUNITY): Payer: Medicare Other | Attending: Oncology

## 2017-09-20 ENCOUNTER — Encounter (HOSPITAL_COMMUNITY): Payer: Self-pay | Admitting: Oncology

## 2017-09-20 VITALS — BP 130/64 | HR 76 | Temp 98.0°F | Resp 16 | Ht 61.0 in | Wt 182.2 lb

## 2017-09-20 DIAGNOSIS — Z794 Long term (current) use of insulin: Secondary | ICD-10-CM

## 2017-09-20 DIAGNOSIS — K449 Diaphragmatic hernia without obstruction or gangrene: Secondary | ICD-10-CM | POA: Insufficient documentation

## 2017-09-20 DIAGNOSIS — D6959 Other secondary thrombocytopenia: Secondary | ICD-10-CM | POA: Insufficient documentation

## 2017-09-20 DIAGNOSIS — D696 Thrombocytopenia, unspecified: Secondary | ICD-10-CM

## 2017-09-20 DIAGNOSIS — K3189 Other diseases of stomach and duodenum: Secondary | ICD-10-CM

## 2017-09-20 DIAGNOSIS — Z809 Family history of malignant neoplasm, unspecified: Secondary | ICD-10-CM

## 2017-09-20 DIAGNOSIS — K2270 Barrett's esophagus without dysplasia: Secondary | ICD-10-CM | POA: Diagnosis not present

## 2017-09-20 DIAGNOSIS — I8390 Asymptomatic varicose veins of unspecified lower extremity: Secondary | ICD-10-CM

## 2017-09-20 DIAGNOSIS — R197 Diarrhea, unspecified: Secondary | ICD-10-CM | POA: Diagnosis not present

## 2017-09-20 DIAGNOSIS — Z79899 Other long term (current) drug therapy: Secondary | ICD-10-CM | POA: Insufficient documentation

## 2017-09-20 DIAGNOSIS — K219 Gastro-esophageal reflux disease without esophagitis: Secondary | ICD-10-CM | POA: Diagnosis not present

## 2017-09-20 DIAGNOSIS — K7581 Nonalcoholic steatohepatitis (NASH): Secondary | ICD-10-CM | POA: Insufficient documentation

## 2017-09-20 DIAGNOSIS — K766 Portal hypertension: Secondary | ICD-10-CM | POA: Diagnosis not present

## 2017-09-20 DIAGNOSIS — Z8719 Personal history of other diseases of the digestive system: Secondary | ICD-10-CM

## 2017-09-20 DIAGNOSIS — E785 Hyperlipidemia, unspecified: Secondary | ICD-10-CM | POA: Diagnosis not present

## 2017-09-20 DIAGNOSIS — K649 Unspecified hemorrhoids: Secondary | ICD-10-CM | POA: Diagnosis not present

## 2017-09-20 DIAGNOSIS — I10 Essential (primary) hypertension: Secondary | ICD-10-CM

## 2017-09-20 DIAGNOSIS — E119 Type 2 diabetes mellitus without complications: Secondary | ICD-10-CM | POA: Insufficient documentation

## 2017-09-20 DIAGNOSIS — Z90710 Acquired absence of both cervix and uterus: Secondary | ICD-10-CM | POA: Diagnosis not present

## 2017-09-20 DIAGNOSIS — D508 Other iron deficiency anemias: Secondary | ICD-10-CM

## 2017-09-20 DIAGNOSIS — R161 Splenomegaly, not elsewhere classified: Secondary | ICD-10-CM | POA: Insufficient documentation

## 2017-09-20 DIAGNOSIS — E538 Deficiency of other specified B group vitamins: Secondary | ICD-10-CM | POA: Diagnosis not present

## 2017-09-20 DIAGNOSIS — D500 Iron deficiency anemia secondary to blood loss (chronic): Secondary | ICD-10-CM | POA: Diagnosis not present

## 2017-09-20 DIAGNOSIS — K7460 Unspecified cirrhosis of liver: Secondary | ICD-10-CM

## 2017-09-20 DIAGNOSIS — I8501 Esophageal varices with bleeding: Secondary | ICD-10-CM | POA: Diagnosis not present

## 2017-09-20 LAB — CBC WITH DIFFERENTIAL/PLATELET
BASOS PCT: 0 %
Basophils Absolute: 0 10*3/uL (ref 0.0–0.1)
Eosinophils Absolute: 0.2 10*3/uL (ref 0.0–0.7)
Eosinophils Relative: 4 %
HEMATOCRIT: 37.9 % (ref 36.0–46.0)
HEMOGLOBIN: 12.8 g/dL (ref 12.0–15.0)
LYMPHS ABS: 1.3 10*3/uL (ref 0.7–4.0)
Lymphocytes Relative: 31 %
MCH: 29.9 pg (ref 26.0–34.0)
MCHC: 33.8 g/dL (ref 30.0–36.0)
MCV: 88.6 fL (ref 78.0–100.0)
MONO ABS: 0.4 10*3/uL (ref 0.1–1.0)
MONOS PCT: 8 %
NEUTROS ABS: 2.4 10*3/uL (ref 1.7–7.7)
Neutrophils Relative %: 57 %
Platelets: 52 10*3/uL — ABNORMAL LOW (ref 150–400)
RBC: 4.28 MIL/uL (ref 3.87–5.11)
RDW: 14.7 % (ref 11.5–15.5)
WBC: 4.2 10*3/uL (ref 4.0–10.5)

## 2017-09-20 LAB — COMPREHENSIVE METABOLIC PANEL
ALBUMIN: 4 g/dL (ref 3.5–5.0)
ALK PHOS: 68 U/L (ref 38–126)
ALT: 28 U/L (ref 14–54)
ANION GAP: 9 (ref 5–15)
AST: 41 U/L (ref 15–41)
BILIRUBIN TOTAL: 3.7 mg/dL — AB (ref 0.3–1.2)
BUN: 13 mg/dL (ref 6–20)
CALCIUM: 9.7 mg/dL (ref 8.9–10.3)
CO2: 20 mmol/L — ABNORMAL LOW (ref 22–32)
Chloride: 107 mmol/L (ref 101–111)
Creatinine, Ser: 1.17 mg/dL — ABNORMAL HIGH (ref 0.44–1.00)
GFR calc Af Amer: 55 mL/min — ABNORMAL LOW (ref >60)
GFR, EST NON AFRICAN AMERICAN: 47 mL/min — AB (ref >60)
GLUCOSE: 210 mg/dL — AB (ref 65–99)
POTASSIUM: 4.2 mmol/L (ref 3.5–5.1)
Sodium: 136 mmol/L (ref 135–145)
TOTAL PROTEIN: 7.3 g/dL (ref 6.5–8.1)

## 2017-09-20 LAB — FERRITIN: Ferritin: 101 ng/mL (ref 11–307)

## 2017-09-20 LAB — VITAMIN B12: Vitamin B-12: 511 pg/mL (ref 180–914)

## 2017-09-20 LAB — IRON AND TIBC
IRON: 126 ug/dL (ref 28–170)
SATURATION RATIOS: 38 % — AB (ref 10.4–31.8)
TIBC: 335 ug/dL (ref 250–450)
UIBC: 209 ug/dL

## 2017-09-20 MED ORDER — CYANOCOBALAMIN 1000 MCG/ML IJ SOLN
INTRAMUSCULAR | Status: AC
  Filled 2017-09-20: qty 1

## 2017-09-20 MED ORDER — CYANOCOBALAMIN 1000 MCG/ML IJ SOLN
1000.0000 ug | Freq: Once | INTRAMUSCULAR | Status: AC
  Administered 2017-09-20: 1000 ug via INTRAMUSCULAR

## 2017-09-20 NOTE — Progress Notes (Signed)
Alisha [name] presents today for injection per the provider's orders.  B12 administration without incident; see MAR for injection details.  Patient tolerated procedure well and without incident.  No questions or complaints noted at this time.  Discharged ambulatory.

## 2017-09-20 NOTE — Progress Notes (Signed)
HEMATOLOGY/ONCOLOGY PROGRESS NOTE  Date of Service: 09/20/2017  Patient Care Team: Glenda Chroman, MD as PCP - General (Internal Medicine) Gala Romney Cristopher Estimable, MD (Gastroenterology)  CHIEF COMPLAINTS/PURPOSE OF CONSULTATION:  Anemia  HISTORY OF PRESENTING ILLNESS:  Alisha [name] is a wonderful 67 y.o. female who returns for management of anemia and thrombocytopenia  Patient was last seen by Dr. Talbert Cage in October 2018 where she presented for follow-up.  She had been doing well and had denied any recent GI bleeding.  Last EGD was on 03/18/2017 demonstrated varices found in mid esophagus and distal esophagus.  6 bands were placed with incomplete eradication of varices.  There is no bleeding at the  end of the procedure.  She was found to have moderate portal hypertensive gastropathy.  She has is not had any imaging since.  Patient was hospitalized in April 2018 for hemoptysis. She was found to have a bleeding esophageal varices and had 7 bands placed in the hospital and another 6 bands placed in the outpatient setting by Dr. Gala Romney. She was not given a blood transfusion while she was inpatient, her hemoglobin stays stable in the 8 g/dl range. She had a repeat EGD and colonoscopy on 01/09/17 with results that shown below. Patient has not had any repeat episodes of hemoptysis and she states that she feels better today than she did prior to going to the hospital. CBC performed today demonstrated her hemoglobin is up to 10.9 g/dl, platelet count 46K. she is having some mild diarrhea with about 2-3 bowel movements a day. She states she has diarrhea right after she eats. Otherwise she denies any chest pain, shortness breath, abdominal pain, easy bruising, or any new bleeding issues.   INTERVAL HISTORY: Patient presents today for follow-up.  Patient states her energy level has increased after 2 doses of iron that was given mid-October.  Patient states she believes her energy level would be better if she was  able to move around more.  She is attempting to perform daily intentional exercise.  She denies any recent GI bleeding.  She denies any fatigue, abdominal pain, chest pain, shortness of breath, or recent infections.  She admits to occasional diarrhea that is relieved with Imodium.  She does not know what the precipitating factor is. She has not had any further complications and has not been seen in the emergency room.  She notes to not be a candidate for liver transplant at [facility] due to her "liver score" being too low.  Cannot find any definitive documentation of this.   MEDICAL HISTORY:  Past Medical History:  Diagnosis Date  . B12 deficiency 11/03/2016  . B12 deficiency 11/03/2016  . Barrett's esophagus   . Cirrhosis of liver without mention of alcohol    hep B surface antigen and HCV ab negative.pt has not had hepatitis A and B vaccines.U/S on 07/04/13 shows cirrhosis.  . Diverticula of colon    pancolonic  . Esophageal reflux   . Esophageal varices ([place])   . Hiatal hernia   . Iron deficiency anemia, unspecified   . Other and unspecified hyperlipidemia   . Portal hypertensive gastropathy ([place])   . Tubular adenoma   . Type II or unspecified type diabetes mellitus without mention of complication, not stated as uncontrolled   . Unspecified essential hypertension   . Unspecified hemorrhoids without mention of complication     SURGICAL HISTORY: Past Surgical History:  Procedure Laterality Date  . ABDOMINAL HYSTERECTOMY    . BIOPSY  04/30/2016   Procedure: BIOPSY;  Surgeon: Daneil Dolin, MD;  [facility];  Service: Endoscopy;;  esophagus  . CESAREAN SECTION     x 2  . COLONOSCOPY  03/2006   left sided diverticula, splenic flexure tublar adenoma  . COLONOSCOPY  07/2002   villous tubular adenoma in rectum  . COLONOSCOPY  09/23/09   external hemorrhoids/scattered pan colonic diverticula otherwise normal. cecal lipoma bx negative, normal TI. Next TCS 09/2014  . COLONOSCOPY N/A  11/12/2014   Procedure: COLONOSCOPY;  Surgeon: Daneil Dolin, MD;  [facility];  Service: Endoscopy;  Laterality: N/A;  1215 - moved to 12:30 - Ginger to notify pt  . COLONOSCOPY N/A 01/07/2017   Procedure: COLONOSCOPY;  Surgeon: Daneil Dolin, MD;  [facility];  Service: Endoscopy;  Laterality: N/A;  3:15PM  . ESOPHAGEAL BANDING N/A 12/20/2016   Procedure: ESOPHAGEAL BANDING;  Surgeon: Danie Binder, MD;  [facility];  Service: Endoscopy;  Laterality: N/A;  . ESOPHAGEAL BANDING N/A 03/18/2017   Procedure: ESOPHAGEAL BANDING;  Surgeon: Daneil Dolin, MD;  [facility];  Service: Endoscopy;  Laterality: N/A;  . ESOPHAGOGASTRODUODENOSCOPY  08/2006   barretts esophagus, no dyplasia  . ESOPHAGOGASTRODUODENOSCOPY  03/2006   barretts esophagus,bx focal atypia c/w low grade dysplasia, SB bx negative for celiac  . ESOPHAGOGASTRODUODENOSCOPY  09/23/09   3 columns of grade 2 esophageal varices/hiatal hernia/3-4 cm segment Barrett's esophagus without dysplasia. Next EGD 09/2012  . ESOPHAGOGASTRODUODENOSCOPY N/A 11/11/2012   Dr. Gala Romney- Barrett;s esophagus, esophageal varices, portal gastopathy. hiatal hernia  . ESOPHAGOGASTRODUODENOSCOPY N/A 04/30/2016   Procedure: ESOPHAGOGASTRODUODENOSCOPY (EGD);  Surgeon: Daneil Dolin, MD;  [facility];  Service: Endoscopy;  Laterality: N/A;  815  . ESOPHAGOGASTRODUODENOSCOPY N/A 12/20/2016   Procedure: ESOPHAGOGASTRODUODENOSCOPY (EGD);  Surgeon: Danie Binder, MD;  [facility];  Service: Endoscopy;  Laterality: N/A;  . ESOPHAGOGASTRODUODENOSCOPY N/A 01/07/2017   Procedure: ESOPHAGOGASTRODUODENOSCOPY (EGD);  Surgeon: Daneil Dolin, MD;  [facility];  Service: Endoscopy;  Laterality: N/A;  . ESOPHAGOGASTRODUODENOSCOPY N/A 03/18/2017   Procedure: ESOPHAGOGASTRODUODENOSCOPY (EGD);  Surgeon: Daneil Dolin, MD;  [facility];  Service: Endoscopy;  Laterality: N/A;  1030   .  [place]  2008  . small bowel capsule endoscopy  10/2009   normal    SOCIAL HISTORY: Social History   Socioeconomic History  . Marital status: Married    Spouse name: Not on file  . Number of children: Not on file  . Years of education: Not on file  . Highest education level: Not on file  Social Needs  . Financial resource strain: Not on file  . Food insecurity - worry: Not on file  . Food insecurity - inability: Not on file  . Transportation needs - medical: Not on file  . Transportation needs - non-medical: Not on file  Occupational History  . Not on file  Tobacco Use  . Smoking status: Never Smoker  . Smokeless tobacco: Never Used  Substance and Sexual Activity  . Alcohol use: No  . Drug use: No  . Sexual activity: Yes  Other Topics Concern  . Not on file  Social History Narrative  . Not on file    FAMILY HISTORY: Family History  Problem Relation Age of Onset  . Alzheimer's disease Mother   . CAD Father   . Diabetes Mellitus II Father   . Alzheimer's disease Father   .  Diabetes Mellitus II Sister   . Cancer - Other Brother   . Diabetes Mellitus II Brother   . Hypertension Brother   . Colon cancer Neg Hx   . Liver disease Neg Hx     ALLERGIES:  is allergic to codeine.  MEDICATIONS:  Current Outpatient Medications  Medication Sig Dispense Refill  . cyanocobalamin (,VITAMIN B-12,) 1000 MCG/ML injection Inject 1,000 mcg into the muscle every 30 (thirty) days.     Marland Kitchen GLIPIZIDE XL 10 MG 24 hr tablet Take 10 mg by mouth daily.     Marland Kitchen lisinopril (PRINIVIL,ZESTRIL) 10 MG tablet Take 10 mg by mouth daily.     . metFORMIN (GLUCOPHAGE) 1000 MG tablet Take 1,000 mg by mouth 2 (two) times daily with a meal.     . pantoprazole (PROTONIX) 40 MG tablet Take 1 tablet (40 mg total) by mouth 2 (two) times daily before a meal. 60 tablet 1  . propranolol (INDERAL) 10 MG tablet Take 3 tablets in the morning and 3 in the evening 180 tablet 11  . TRESIBA FLEXTOUCH 200  UNIT/ML SOPN Inject 44 Units into the skin daily.   12  . VICTOZA 18 MG/3ML SOLN Inject 1.8 mg into the skin daily.     . Vitamin D, Ergocalciferol, (DRISDOL) 50000 units CAPS capsule Take 50,000 Units by mouth once a week. X 10 weeks     No current facility-administered medications for this visit.     REVIEW OF SYSTEMS:   Review of Systems  Constitutional: Positive for malaise/fatigue. Negative for fever and weight loss.       Energy level better but still complains of fatigue  HENT: Negative.   Eyes: Negative.   Respiratory: Negative.  Negative for hemoptysis, sputum production and shortness of breath.   Cardiovascular: Negative.  Negative for leg swelling.  Gastrointestinal: Negative.  Negative for abdominal pain, blood in stool and melena.  Genitourinary: Negative.  Negative for hematuria.  Musculoskeletal: Negative.   Skin: Negative.   Neurological: Negative.  Negative for tingling.  Endo/Heme/Allergies: Negative.  Does not bruise/bleed easily.  Psychiatric/Behavioral: Negative.   All other systems reviewed and are negative. 14 Point review of Systems was done is negative except as noted above.  PHYSICAL EXAMINATION: ECOG PERFORMANCE STATUS: 1 - Symptomatic but completely ambulatory Vitals:   09/20/17 1505  BP: 130/64  Pulse: 76  Resp: 16  Temp: 98 F (36.7 C)  SpO2: 97%    Physical Exam  Constitutional: She is oriented to person, place, and time and well-developed, well-nourished, and in no distress. No distress.  HENT:  Head: Normocephalic and atraumatic.  Mouth/Throat: No oropharyngeal exudate.  Eyes: Conjunctivae and EOM are normal. Pupils are equal, round, and reactive to light. No scleral icterus.  Neck: Normal range of motion. Neck supple. No JVD present.  Cardiovascular: Normal rate, regular rhythm and normal heart sounds. Exam reveals no gallop and no friction rub.  No murmur heard. Pulmonary/Chest: Effort normal and breath sounds normal. No respiratory  distress. She has no wheezes. She has no rales.  Abdominal: Soft. Bowel sounds are normal. She exhibits no distension. There is no tenderness. There is no guarding.  Musculoskeletal: Normal range of motion. She exhibits no edema or tenderness.  Lymphadenopathy:    She has no cervical adenopathy.  Neurological: She is alert and oriented to person, place, and time. No cranial nerve deficit. Gait normal.  Skin: Skin is warm and dry. No rash noted. No erythema. No pallor.  Psychiatric: Affect and  judgment normal.  Nursing note and vitals reviewed.  LABORATORY DATA:  I have reviewed the data as listed  CBC Latest Ref Rng & Units 09/20/2017 06/04/2017 02/02/2017  WBC 4.0 - 10.5 K/uL 4.2 4.0 2.8(L)  Hemoglobin 12.0 - 15.0 g/dL 12.8 11.2(L) 10.9(L)  Hematocrit 36.0 - 46.0 % 37.9 34.4(L) 33.1(L)  Platelets 150 - 400 K/uL 52(L) 52(L) 49(L)    . CMP Latest Ref Rng & Units 09/20/2017 06/04/2017 02/02/2017  Glucose 65 - 99 mg/dL 210(H) 165(H) 335(H)  BUN 6 - 20 mg/dL 13 11 9   Creatinine 0.44 - 1.00 mg/dL 1.17(H) 0.83 0.74  Sodium 135 - 145 mmol/L 136 137 133(L)  Potassium 3.5 - 5.1 mmol/L 4.2 4.4 4.4  Chloride 101 - 111 mmol/L 107 108 104  CO2 22 - 32 mmol/L 20(L) 19(L) 20(L)  Calcium 8.9 - 10.3 mg/dL 9.7 9.2 9.0  Total Protein 6.5 - 8.1 g/dL 7.3 7.2 6.4(L)  Total Bilirubin 0.3 - 1.2 mg/dL 3.7(H) 2.2(H) 1.8(H)  Alkaline Phos 38 - 126 U/L 68 58 53  AST 15 - 41 U/L 41 43(H) 42(H)  ALT 14 - 54 U/L 28 26 23     PATHOLOGY:   Colonoscopy 01/09/17: - Internal hemorrhoids that do not return to the anal canal, thus continuously prolapsed (Grade IV) found on perianal exam. - One 7 mm polyp in the cecum, removed with a hot snare. Resected and retrieved. - Diverticulosis. Congested mucosa. Focally ulcerated segment of sigmoid mucosa?status post biopsy - The examination was otherwise normal on direct and retroflexion views. I wonder about an element of ischemic colitis and not just  diverticulitis. Diagnosis 1. Colon, polyp(s), cecal - TUBULAR ADENOMA(S). - HIGH GRADE DYSPLASIA IS NOT IDENTIFIED. 2. Colon, biopsy, sigmoid - BENIGN COLONIC MUCOSA. - NO PATHOLOGICAL ALTERATIONS.  EGD 01/09/17 Esophageal mucosal changes consistent with short-segment Barrett's esophagus. - Grade III esophageal varices. Incompletely eradicated. Banded. - Small hiatal hernia. - Portal hypertensive gastropathy. - Normal duodenal bulb and second portion of the duodenum. - No specimens collected.    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  Thrombocytopenia stable.  Platelet count 52 today which is stable.  Continue to monitor.  Hemoglobin 12.8 today.  Iron labs and B12 pending at time of dictation.  I suspect they are both okay given her hemoglobin is significantly higher than previous labs.  She receives monthly B12 injections. Will call with results. Return to clinic in 3 months with labs: CBC, CMP, iron studies and B12 and MD assessment.  All of the patients questions were answered with apparent satisfaction. The patient knows to call the clinic with any problems, questions or concerns.   Jacquelin Hawking, NP  09/20/2017 3:44 PM

## 2017-09-20 NOTE — Telephone Encounter (Signed)
Spoke with pt and she had the Hep vaccines over 1 year ago. Pt wen to [facility] for evaluation and was told that she didn't have immunity to Hep C. Pt was then told she needs to double her Hep Vaccine. Please advise. Pt thought she completed her series and isn't sure why she would need to complete it again.

## 2017-09-21 NOTE — Telephone Encounter (Signed)
I reviewed the labs from [facility] and it does look like she does not have Hep B immunity (there is no vaccine for Hep C). We should go ahead and have her complete another series since it seems like she completed it relatively recently (within the past 1-2 years) and did not develop immunity. This will be a series of three shots: Initial vaccine, another 1 month later, and then 5 months after that). We can retest her for immunity after she has completed the series.   We can do an Rx for Hepatitis B vaccine series, dispense 1 series with no refills (should be able to be stamped). Let me know if I need to write it.

## 2017-09-22 ENCOUNTER — Telehealth (HOSPITAL_COMMUNITY): Payer: Self-pay | Admitting: *Deleted

## 2017-09-22 NOTE — Telephone Encounter (Signed)
Pt notified and will pick script up today.

## 2017-09-22 NOTE — Telephone Encounter (Signed)
-----   Message from Jacquelin Hawking, NP sent at 09/22/2017  2:55 PM EST ----- Will someone call her with her iron lab results. They appear to be much  Improved. No need for IV iron at this time.   Rulon Abide

## 2017-09-22 NOTE — Telephone Encounter (Signed)
Spoke with patient via telephone and advised of lab results.  Patient verbalizes understanding.

## 2017-10-21 ENCOUNTER — Other Ambulatory Visit: Payer: Self-pay

## 2017-10-21 ENCOUNTER — Encounter (HOSPITAL_COMMUNITY): Payer: Self-pay

## 2017-10-21 ENCOUNTER — Inpatient Hospital Stay (HOSPITAL_COMMUNITY): Payer: Medicare Other | Attending: Oncology

## 2017-10-21 VITALS — BP 138/62 | HR 75 | Temp 98.3°F | Resp 18

## 2017-10-21 DIAGNOSIS — Z79899 Other long term (current) drug therapy: Secondary | ICD-10-CM | POA: Insufficient documentation

## 2017-10-21 DIAGNOSIS — E538 Deficiency of other specified B group vitamins: Secondary | ICD-10-CM | POA: Insufficient documentation

## 2017-10-21 MED ORDER — CYANOCOBALAMIN 1000 MCG/ML IJ SOLN
INTRAMUSCULAR | Status: AC
  Filled 2017-10-21: qty 1

## 2017-10-21 MED ORDER — CYANOCOBALAMIN 1000 MCG/ML IJ SOLN
1000.0000 ug | Freq: Once | INTRAMUSCULAR | Status: AC
  Administered 2017-10-21: 1000 ug via INTRAMUSCULAR

## 2017-10-21 NOTE — Progress Notes (Signed)
Alisha [name] presents today for injection per the provider's orders.  B12 administration without incident; see MAR for injection details.  Patient tolerated procedure well and without incident.  No questions or complaints noted at this time.  Discharged ambulatory.

## 2017-11-08 ENCOUNTER — Encounter: Payer: Self-pay | Admitting: *Deleted

## 2017-11-08 ENCOUNTER — Ambulatory Visit: Payer: Medicare Other | Admitting: Nurse Practitioner

## 2017-11-08 ENCOUNTER — Other Ambulatory Visit: Payer: Self-pay | Admitting: *Deleted

## 2017-11-08 ENCOUNTER — Encounter: Payer: Self-pay | Admitting: Nurse Practitioner

## 2017-11-08 VITALS — BP 131/75 | HR 87 | Temp 97.2°F | Ht 61.0 in | Wt 187.8 lb

## 2017-11-08 DIAGNOSIS — K7460 Unspecified cirrhosis of liver: Secondary | ICD-10-CM | POA: Diagnosis not present

## 2017-11-08 DIAGNOSIS — I8510 Secondary esophageal varices without bleeding: Secondary | ICD-10-CM | POA: Diagnosis not present

## 2017-11-08 NOTE — Progress Notes (Signed)
Referring Provider: Glenda Chroman, MD Primary Care Physician:  Glenda Chroman, MD Primary GI:  Dr. Gala Romney  Chief Complaint  Patient presents with  . Cirrhosis    HPI:   Alisha [name] is a 67 y.o. female who presents for follow-up on cirrhosis.  The patient was last seen in our office 05/06/2017 for the same.  Likely Nash cirrhosis.  Endoscopic evaluation as per below.  Colonoscopy up-to-date 01/07/2017 with a recommended 5-year repeat colonoscopy (2023).  Ongoing titration of Inderal for target heart rate of 55-60.  Her last visit she was doing well overall, mild swallowing difficulties after variceal banding.  Some chronic fatigue, on B12 which has helped.  No general hepatic symptoms.  Previously found suspicious lesions on MRI of her liver.  Recommended repeat MRI.  This was ordered but does not appear to have occurred.  Given persistent varices and suspicious MRI she was referred to [facility] for transplant evaluation.  Carpenter evaluated her and given her meld score of 13 and controlled esophageal variceal bleeding with banding protocol recommended not listing the patient for transplant at this time but continue 6 months follow-up with the transplant team for ongoing care and consideration of liver transplant when needed.  Recent EGD/banding history: EGD 05/03/2017 for hematemesis and hematochezia, melena found 2 columns of nonbleeding grade 2 and 3 columns of grade 3 varices with stigmata of recent bleed and red wale signs with 7 bands successfully placed, also noted moderate portal hypertensive gastropathy. Repeat endoscopy April 2018 found mucosal changes consistent with short segment Barrett's, grade 3 esophageal varices eradicated status post placement of 6 bands, portal hypertensive gastropathy. Recommended repeat EGD in 1 month.  Most recent EGD completed 03/18/2017 which found varices in the mid and distal esophagus with short columns found to be grade 3 and longer  columns grade 2 with 6 bands successfully placed and incomplete eradication of varices. Moderate portal hypertensive gastropathy in the entire stomach. Recommended repeat endoscopy in 6 months.  She is currently due for repeat upper endoscopy.  Today she states she's doing well overall. Was seen by Transplant at [facility]. They weren't able to do her MRI in November due to clip still in place in the colon. Although the CT report/result doesn't mention a clip. Denies abdominal pain, N/V, hematochezia, melena, unintentional weight loss, fever, chills. Denies yellowing of skin/eyes, acute episodic confusion, darkened urine, tremors/shakes, generalized pruritis. Denies chest pain, dyspnea, dizziness, lightheadedness, syncope, near syncope. Denies any other upper or lower GI symptoms.  A minimum of 30 minutes was spent with the patient of which at least 50% was spent on care coordination and education.  Past Medical History:  Diagnosis Date  . B12 deficiency 11/03/2016  . B12 deficiency 11/03/2016  . Barrett's esophagus   . Cirrhosis of liver without mention of alcohol    hep B surface antigen and HCV ab negative.pt has not had hepatitis A and B vaccines.U/S on 07/04/13 shows cirrhosis.  . Diverticula of colon    pancolonic  . Esophageal reflux   . Esophageal varices ([place])   . Hiatal hernia   . Iron deficiency anemia, unspecified   . Other and unspecified hyperlipidemia   . Portal hypertensive gastropathy ([place])   . Tubular adenoma   . Type II or unspecified type diabetes mellitus without mention of complication, not stated as uncontrolled   . Unspecified essential hypertension   . Unspecified hemorrhoids without mention of complication     Past Surgical  History:  Procedure Laterality Date  . ABDOMINAL HYSTERECTOMY    . BIOPSY  04/30/2016   Procedure: BIOPSY;  Surgeon: Daneil Dolin, MD;  [facility];  Service: Endoscopy;;  esophagus  . CESAREAN SECTION     x 2  . COLONOSCOPY   03/2006   left sided diverticula, splenic flexure tublar adenoma  . COLONOSCOPY  07/2002   villous tubular adenoma in rectum  . COLONOSCOPY  09/23/09   external hemorrhoids/scattered pan colonic diverticula otherwise normal. cecal lipoma bx negative, normal TI. Next TCS 09/2014  . COLONOSCOPY N/A 11/12/2014   Procedure: COLONOSCOPY;  Surgeon: Daneil Dolin, MD;  [facility];  Service: Endoscopy;  Laterality: N/A;  1215 - moved to 12:30 - Ginger to notify pt  . COLONOSCOPY N/A 01/07/2017   Procedure: COLONOSCOPY;  Surgeon: Daneil Dolin, MD;  [facility];  Service: Endoscopy;  Laterality: N/A;  3:15PM  . ESOPHAGEAL BANDING N/A 12/20/2016   Procedure: ESOPHAGEAL BANDING;  Surgeon: Danie Binder, MD;  [facility];  Service: Endoscopy;  Laterality: N/A;  . ESOPHAGEAL BANDING N/A 03/18/2017   Procedure: ESOPHAGEAL BANDING;  Surgeon: Daneil Dolin, MD;  [facility];  Service: Endoscopy;  Laterality: N/A;  . ESOPHAGOGASTRODUODENOSCOPY  08/2006   barretts esophagus, no dyplasia  . ESOPHAGOGASTRODUODENOSCOPY  03/2006   barretts esophagus,bx focal atypia c/w low grade dysplasia, SB bx negative for celiac  . ESOPHAGOGASTRODUODENOSCOPY  09/23/09   3 columns of grade 2 esophageal varices/hiatal hernia/3-4 cm segment Barrett's esophagus without dysplasia. Next EGD 09/2012  . ESOPHAGOGASTRODUODENOSCOPY N/A 11/11/2012   Dr. Gala Romney- Barrett;s esophagus, esophageal varices, portal gastopathy. hiatal hernia  . ESOPHAGOGASTRODUODENOSCOPY N/A 04/30/2016   Procedure: ESOPHAGOGASTRODUODENOSCOPY (EGD);  Surgeon: Daneil Dolin, MD;  [facility];  Service: Endoscopy;  Laterality: N/A;  815  . ESOPHAGOGASTRODUODENOSCOPY N/A 12/20/2016   Procedure: ESOPHAGOGASTRODUODENOSCOPY (EGD);  Surgeon: Danie Binder, MD;  [facility];  Service: Endoscopy;  Laterality: N/A;  . ESOPHAGOGASTRODUODENOSCOPY N/A 01/07/2017   Procedure: ESOPHAGOGASTRODUODENOSCOPY (EGD);   Surgeon: Daneil Dolin, MD;  [facility];  Service: Endoscopy;  Laterality: N/A;  . ESOPHAGOGASTRODUODENOSCOPY N/A 03/18/2017   Procedure: ESOPHAGOGASTRODUODENOSCOPY (EGD);  Surgeon: Daneil Dolin, MD;  [facility];  Service: Endoscopy;  Laterality: N/A;  1030   . [place]  2008  . small bowel capsule endoscopy  10/2009   normal    Current Outpatient Medications  Medication Sig Dispense Refill  . cyanocobalamin (,VITAMIN B-12,) 1000 MCG/ML injection Inject 1,000 mcg into the muscle every 30 (thirty) days.     Marland Kitchen GLIPIZIDE XL 10 MG 24 hr tablet Take 10 mg by mouth daily.     Marland Kitchen lisinopril (PRINIVIL,ZESTRIL) 10 MG tablet Take 10 mg by mouth daily.     . metFORMIN (GLUCOPHAGE) 1000 MG tablet Take 1,000 mg by mouth 2 (two) times daily with a meal.     . pantoprazole (PROTONIX) 40 MG tablet Take 1 tablet (40 mg total) by mouth 2 (two) times daily before a meal. 60 tablet 1  . propranolol (INDERAL) 10 MG tablet Take 3 tablets in the morning and 3 in the evening 180 tablet 11  . TRESIBA FLEXTOUCH 200 UNIT/ML SOPN Inject 44 Units into the skin daily.   12  . VICTOZA 18 MG/3ML SOLN Inject 1.8 mg into the skin daily.      No current facility-administered medications for this visit.  Allergies as of 11/08/2017 - Review Complete 11/08/2017  Allergen Reaction Noted  . Codeine Nausea And Vomiting 04/19/2012    Family History  Problem Relation Age of Onset  . Alzheimer's disease Mother   . CAD Father   . Diabetes Mellitus II Father   . Alzheimer's disease Father   . Diabetes Mellitus II Sister   . Colon cancer Sister 75       small, surgical excision; chemo/rad not needed  . Cancer - Other Brother   . Diabetes Mellitus II Brother   . Hypertension Brother   . Liver disease Neg Hx     Social History   Socioeconomic History  . Marital status: Married    Spouse name: None  . Number of children: None  . Years of education: None  . Highest education  level: None  Social Needs  . Financial resource strain: None  . Food insecurity - worry: None  . Food insecurity - inability: None  . Transportation needs - medical: None  . Transportation needs - non-medical: None  Occupational History  . None  Tobacco Use  . Smoking status: Never Smoker  . Smokeless tobacco: Never Used  Substance and Sexual Activity  . Alcohol use: No  . Drug use: No  . Sexual activity: Yes  Other Topics Concern  . None  Social History Narrative  . None    Review of Systems: General: Negative for anorexia, weight loss, fever, chills, fatigue, weakness. ENT: Negative for hoarseness, difficulty swallowing , nasal congestion. CV: Negative for chest pain, angina, palpitations, dyspnea on exertion, peripheral edema.  Respiratory: Negative for dyspnea at rest, dyspnea on exertion, cough, sputum, wheezing.  GI: See history of present illness. Derm: Negative for rash or itching.  Neuro: Negative for memory loss, confusion.  Endo: Negative for unusual weight change.  Heme: Negative for bruising or bleeding. Allergy: Negative for rash or hives.   Physical Exam: BP 131/75   Pulse 87   Temp (!) 97.2 F (36.2 C) (Oral)   Ht 5' 1"  (1.549 m)   Wt 187 lb 12.8 oz (85.2 kg)   BMI 35.48 kg/m  General:   Alert and oriented. Pleasant and cooperative. Well-nourished and well-developed.  Eyes:  Without icterus, sclera clear and conjunctiva pink.  Ears:  Normal auditory acuity. Cardiovascular:  S1, S2 present without murmurs appreciated. Extremities without clubbing or edema. Respiratory:  Clear to auscultation bilaterally. No wheezes, rales, or rhonchi. No distress.  Gastrointestinal:  +BS, soft, non-tender and non-distended. No HSM noted. No guarding or rebound. No masses appreciated.  Rectal:  Deferred  Musculoskalatal:  Symmetrical without gross deformities. Neurologic:  Alert and oriented x4;  grossly normal neurologically. Psych:  Alert and cooperative. Normal  mood and affect.    11/08/2017 10:07 AM   Disclaimer: This note was dictated with voice recognition software. Similar sounding words can inadvertently be transcribed and may not be corrected upon review.

## 2017-11-08 NOTE — Assessment & Plan Note (Addendum)
Patient with a history of likely [name] cirrhosis who was recently evaluated by [facility] transplant center.  They states she is not a candidate for priority listing at this time given her meld score less than 15 and upper GI bleed controlled with variceal banding, despite poor heart rate control with nonselective beta-blocker.  They indicate they will see her every 6 months.  They were unable to complete an MRI of her liver because of clip still remaining in her colon.  We will check an x-ray at this time to see if clip is still there.  We will check routine labs including CBC, CMP, INR, AFP.  We will also try to get abdominal MRI rechecked based on last radiology recommendations after we obtained abdominal x-ray for clip persistence..  She is due for repeat endoscopy at this time and we will proceed with this as well.  She states she was told she needed an increased dosing schedule of hepatitis B vaccinations.  I recommended that she reach out to do for that prescription as I was unable to find a description of what this was about.  She states she will do that.  Follow-up in 6 months for routine liver care.  Call with any questions or concerns.  Proceed with EGD with Dr. Gala Romney in near future: the risks, benefits, and alternatives have been discussed with the patient in detail. The patient states understanding and desires to proceed.  The patient is not on any anticoagulants, anxiolytics, chronic pain medications, or antidepressants.  Conscious sedation should be adequate for procedure.

## 2017-11-08 NOTE — Patient Instructions (Signed)
1. Follow-up with [facility] about the increased dose of hepatitis B vaccinations. 2. We will schedule your upper endoscopy for you. 3. Have your labs checked when you are able to. 4. We will check an abdominal x-ray to see if that clip is still there.  If it is gone, we will proceed with the previously recommended MRI of your abdomen. 5. If you are able to have the MRI, we will help schedule this. 6. Return for follow-up in 6 months. 7. Call if you have any questions or concerns.

## 2017-11-08 NOTE — Progress Notes (Signed)
cc'ed to pcp °

## 2017-11-08 NOTE — Assessment & Plan Note (Signed)
Patient with known esophageal varices with 2 recent banding episodes.  She is currently due for repeat endoscopy and variceal banding.  We will proceed with this as per below.  No obvious GI bleed noted by the patient.  She remains on nonselective beta-blocker other heart rate is not well controlled.  However, banding protocol is a good alternative for variceal prophylaxis.

## 2017-11-18 ENCOUNTER — Encounter (HOSPITAL_COMMUNITY): Payer: Self-pay

## 2017-11-18 ENCOUNTER — Inpatient Hospital Stay (HOSPITAL_COMMUNITY): Payer: Medicare Other | Attending: Oncology

## 2017-11-18 ENCOUNTER — Ambulatory Visit (HOSPITAL_COMMUNITY)
Admission: RE | Admit: 2017-11-18 | Discharge: 2017-11-18 | Disposition: A | Discharge status: Home / Self Care | Payer: Medicare Other | Source: Ambulatory Visit | Attending: Nurse Practitioner | Admitting: Nurse Practitioner

## 2017-11-18 ENCOUNTER — Other Ambulatory Visit (HOSPITAL_COMMUNITY)
Admission: RE | Admit: 2017-11-18 | Discharge: 2017-11-18 | Disposition: A | Discharge status: Home / Self Care | Payer: Medicare Other | Source: Ambulatory Visit | Attending: Nurse Practitioner | Admitting: Nurse Practitioner

## 2017-11-18 ENCOUNTER — Other Ambulatory Visit: Payer: Self-pay

## 2017-11-18 VITALS — BP 127/77 | HR 71 | Temp 97.4°F | Resp 18

## 2017-11-18 DIAGNOSIS — Z79899 Other long term (current) drug therapy: Secondary | ICD-10-CM | POA: Diagnosis not present

## 2017-11-18 DIAGNOSIS — K7460 Unspecified cirrhosis of liver: Secondary | ICD-10-CM | POA: Diagnosis present

## 2017-11-18 DIAGNOSIS — E538 Deficiency of other specified B group vitamins: Secondary | ICD-10-CM | POA: Diagnosis present

## 2017-11-18 LAB — COMPREHENSIVE METABOLIC PANEL
ALBUMIN: 4.1 g/dL (ref 3.5–5.0)
ALK PHOS: 69 U/L (ref 38–126)
ALT: 25 U/L (ref 14–54)
AST: 37 U/L (ref 15–41)
Anion gap: 9 (ref 5–15)
BUN: 17 mg/dL (ref 6–20)
CALCIUM: 10 mg/dL (ref 8.9–10.3)
CO2: 20 mmol/L — ABNORMAL LOW (ref 22–32)
CREATININE: 1.37 mg/dL — AB (ref 0.44–1.00)
Chloride: 108 mmol/L (ref 101–111)
GFR calc Af Amer: 45 mL/min — ABNORMAL LOW (ref >60)
GFR, EST NON AFRICAN AMERICAN: 39 mL/min — AB (ref >60)
GLUCOSE: 218 mg/dL — AB (ref 65–99)
Potassium: 5.1 mmol/L (ref 3.5–5.1)
Sodium: 137 mmol/L (ref 135–145)
TOTAL PROTEIN: 7.5 g/dL (ref 6.5–8.1)
Total Bilirubin: 2.6 mg/dL — ABNORMAL HIGH (ref 0.3–1.2)

## 2017-11-18 LAB — CBC WITH DIFFERENTIAL/PLATELET
BASOS ABS: 0 10*3/uL (ref 0.0–0.1)
BASOS PCT: 0 %
Eosinophils Absolute: 0.2 10*3/uL (ref 0.0–0.7)
Eosinophils Relative: 4 %
HEMATOCRIT: 35.9 % — AB (ref 36.0–46.0)
Hemoglobin: 11.8 g/dL — ABNORMAL LOW (ref 12.0–15.0)
Lymphocytes Relative: 29 %
Lymphs Abs: 1.1 10*3/uL (ref 0.7–4.0)
MCH: 30.2 pg (ref 26.0–34.0)
MCHC: 32.9 g/dL (ref 30.0–36.0)
MCV: 91.8 fL (ref 78.0–100.0)
Monocytes Absolute: 0.3 10*3/uL (ref 0.1–1.0)
Monocytes Relative: 8 %
NEUTROS ABS: 2.2 10*3/uL (ref 1.7–7.7)
NEUTROS PCT: 59 %
Platelets: 54 10*3/uL — ABNORMAL LOW (ref 150–400)
RBC: 3.91 MIL/uL (ref 3.87–5.11)
RDW: 14.2 % (ref 11.5–15.5)
WBC: 3.7 10*3/uL — ABNORMAL LOW (ref 4.0–10.5)

## 2017-11-18 LAB — PROTIME-INR
INR: 1.25
PROTHROMBIN TIME: 15.6 s — AB (ref 11.4–15.2)

## 2017-11-18 MED ORDER — CYANOCOBALAMIN 1000 MCG/ML IJ SOLN
INTRAMUSCULAR | Status: AC
  Filled 2017-11-18: qty 1

## 2017-11-18 MED ORDER — CYANOCOBALAMIN 1000 MCG/ML IJ SOLN
1000.0000 ug | Freq: Once | INTRAMUSCULAR | Status: AC
  Administered 2017-11-18: 1000 ug via INTRAMUSCULAR

## 2017-11-18 NOTE — Progress Notes (Signed)
Alisha [name] presents today for injection per the provider's orders.  B12 administration without incident; see MAR for injection details.  Patient tolerated procedure well and without incident.  No questions or complaints noted at this time. Discharged ambulatory.

## 2017-11-19 LAB — AFP TUMOR MARKER: AFP, Serum, Tumor Marker: 7 ng/mL (ref 0.0–8.3)

## 2017-12-13 ENCOUNTER — Other Ambulatory Visit: Payer: Self-pay

## 2017-12-13 DIAGNOSIS — R7989 Other specified abnormal findings of blood chemistry: Secondary | ICD-10-CM

## 2017-12-16 ENCOUNTER — Telehealth: Payer: Self-pay

## 2017-12-16 NOTE — Telephone Encounter (Signed)
Called Dr. Florentina Addison office to f/u on nephrology referral. They received referral, but haven't scheduled appt.

## 2017-12-17 ENCOUNTER — Other Ambulatory Visit (HOSPITAL_COMMUNITY): Payer: Self-pay | Admitting: *Deleted

## 2017-12-17 ENCOUNTER — Encounter (HOSPITAL_COMMUNITY): Payer: Self-pay | Admitting: *Deleted

## 2017-12-17 ENCOUNTER — Encounter (HOSPITAL_COMMUNITY)
Admission: RE | Disposition: A | Discharge status: Home Health | Payer: Self-pay | Source: Ambulatory Visit | Attending: Internal Medicine

## 2017-12-17 ENCOUNTER — Ambulatory Visit (HOSPITAL_COMMUNITY)
Admission: RE | Admit: 2017-12-17 | Discharge: 2017-12-17 | Disposition: A | Discharge status: Home Health | Payer: Medicare Other | Source: Ambulatory Visit | Attending: Internal Medicine | Admitting: Internal Medicine

## 2017-12-17 ENCOUNTER — Other Ambulatory Visit: Payer: Self-pay

## 2017-12-17 DIAGNOSIS — I8500 Esophageal varices without bleeding: Secondary | ICD-10-CM

## 2017-12-17 DIAGNOSIS — K219 Gastro-esophageal reflux disease without esophagitis: Secondary | ICD-10-CM | POA: Insufficient documentation

## 2017-12-17 DIAGNOSIS — K7460 Unspecified cirrhosis of liver: Secondary | ICD-10-CM | POA: Diagnosis not present

## 2017-12-17 DIAGNOSIS — K766 Portal hypertension: Secondary | ICD-10-CM | POA: Diagnosis not present

## 2017-12-17 DIAGNOSIS — I8510 Secondary esophageal varices without bleeding: Secondary | ICD-10-CM

## 2017-12-17 DIAGNOSIS — E119 Type 2 diabetes mellitus without complications: Secondary | ICD-10-CM | POA: Diagnosis not present

## 2017-12-17 DIAGNOSIS — K7581 Nonalcoholic steatohepatitis (NASH): Secondary | ICD-10-CM | POA: Insufficient documentation

## 2017-12-17 DIAGNOSIS — Z8249 Family history of ischemic heart disease and other diseases of the circulatory system: Secondary | ICD-10-CM | POA: Diagnosis not present

## 2017-12-17 DIAGNOSIS — K3189 Other diseases of stomach and duodenum: Secondary | ICD-10-CM | POA: Diagnosis not present

## 2017-12-17 DIAGNOSIS — K2270 Barrett's esophagus without dysplasia: Secondary | ICD-10-CM | POA: Diagnosis not present

## 2017-12-17 DIAGNOSIS — D508 Other iron deficiency anemias: Secondary | ICD-10-CM

## 2017-12-17 DIAGNOSIS — Z79899 Other long term (current) drug therapy: Secondary | ICD-10-CM | POA: Diagnosis not present

## 2017-12-17 DIAGNOSIS — E538 Deficiency of other specified B group vitamins: Secondary | ICD-10-CM | POA: Insufficient documentation

## 2017-12-17 DIAGNOSIS — Z7984 Long term (current) use of oral hypoglycemic drugs: Secondary | ICD-10-CM | POA: Insufficient documentation

## 2017-12-17 DIAGNOSIS — Z885 Allergy status to narcotic agent status: Secondary | ICD-10-CM | POA: Diagnosis not present

## 2017-12-17 DIAGNOSIS — I10 Essential (primary) hypertension: Secondary | ICD-10-CM | POA: Diagnosis not present

## 2017-12-17 DIAGNOSIS — E785 Hyperlipidemia, unspecified: Secondary | ICD-10-CM | POA: Insufficient documentation

## 2017-12-17 HISTORY — PX: ESOPHAGEAL BANDING: SHX5518

## 2017-12-17 HISTORY — PX: ESOPHAGOGASTRODUODENOSCOPY: SHX5428

## 2017-12-17 LAB — GLUCOSE, CAPILLARY: Glucose-Capillary: 113 mg/dL — ABNORMAL HIGH (ref 65–99)

## 2017-12-17 SURGERY — EGD (ESOPHAGOGASTRODUODENOSCOPY)
Anesthesia: Moderate Sedation

## 2017-12-17 MED ORDER — MEPERIDINE HCL 100 MG/ML IJ SOLN
INTRAMUSCULAR | Status: DC | PRN
  Administered 2017-12-17: 50 mg
  Administered 2017-12-17: 25 mg

## 2017-12-17 MED ORDER — LIDOCAINE VISCOUS 2 % MT SOLN
OROMUCOSAL | Status: AC
  Filled 2017-12-17: qty 15

## 2017-12-17 MED ORDER — ONDANSETRON HCL 4 MG/2ML IJ SOLN
INTRAMUSCULAR | Status: DC | PRN
  Administered 2017-12-17: 4 mg via INTRAVENOUS

## 2017-12-17 MED ORDER — MIDAZOLAM HCL 5 MG/5ML IJ SOLN
INTRAMUSCULAR | Status: AC
  Filled 2017-12-17: qty 10

## 2017-12-17 MED ORDER — SODIUM CHLORIDE 0.9 % IV SOLN
INTRAVENOUS | Status: DC
  Administered 2017-12-17: 1000 mL via INTRAVENOUS

## 2017-12-17 MED ORDER — LIDOCAINE VISCOUS 2 % MT SOLN
OROMUCOSAL | Status: DC | PRN
  Administered 2017-12-17: 1 via OROMUCOSAL

## 2017-12-17 MED ORDER — MIDAZOLAM HCL 5 MG/5ML IJ SOLN
INTRAMUSCULAR | Status: DC | PRN
  Administered 2017-12-17: 2 mg via INTRAVENOUS
  Administered 2017-12-17: 1 mg via INTRAVENOUS

## 2017-12-17 MED ORDER — ONDANSETRON HCL 4 MG/2ML IJ SOLN
INTRAMUSCULAR | Status: AC
  Filled 2017-12-17: qty 2

## 2017-12-17 MED ORDER — MEPERIDINE HCL 100 MG/ML IJ SOLN
INTRAMUSCULAR | Status: AC
  Filled 2017-12-17: qty 2

## 2017-12-17 NOTE — H&P (Addendum)
@LOGO @   Primary Care Physician:  Glenda Chroman, MD Primary Gastroenterologist:  Dr. Gala Romney  Pre-Procedure History & Physical: HPI:  Alisha [name] is a 67 y.o. female here for NASH/cirrhosis. Here for EGD with EBL.  Past Medical History:  Diagnosis Date  . B12 deficiency 11/03/2016  . B12 deficiency 11/03/2016  . Barrett's esophagus   . Cirrhosis of liver without mention of alcohol    hep B surface antigen and HCV ab negative.pt has not had hepatitis A and B vaccines.U/S on 07/04/13 shows cirrhosis.  . Diverticula of colon    pancolonic  . Esophageal reflux   . Esophageal varices ([place])   . Hiatal hernia   . Iron deficiency anemia, unspecified   . Other and unspecified hyperlipidemia   . Portal hypertensive gastropathy ([place])   . Tubular adenoma   . Type II or unspecified type diabetes mellitus without mention of complication, not stated as uncontrolled   . Unspecified essential hypertension   . Unspecified hemorrhoids without mention of complication     Past Surgical History:  Procedure Laterality Date  . ABDOMINAL HYSTERECTOMY    . BIOPSY  04/30/2016   Procedure: BIOPSY;  Surgeon: Daneil Dolin, MD;  [facility];  Service: Endoscopy;;  esophagus  . CESAREAN SECTION     x 2  . COLONOSCOPY  03/2006   left sided diverticula, splenic flexure tublar adenoma  . COLONOSCOPY  07/2002   villous tubular adenoma in rectum  . COLONOSCOPY  09/23/09   external hemorrhoids/scattered pan colonic diverticula otherwise normal. cecal lipoma bx negative, normal TI. Next TCS 09/2014  . COLONOSCOPY N/A 11/12/2014   Procedure: COLONOSCOPY;  Surgeon: Daneil Dolin, MD;  [facility];  Service: Endoscopy;  Laterality: N/A;  1215 - moved to 12:30 - Ginger to notify pt  . COLONOSCOPY N/A 01/07/2017   Procedure: COLONOSCOPY;  Surgeon: Daneil Dolin, MD;  [facility];  Service: Endoscopy;  Laterality: N/A;  3:15PM  . ESOPHAGEAL BANDING N/A 12/20/2016   Procedure:  ESOPHAGEAL BANDING;  Surgeon: Danie Binder, MD;  [facility];  Service: Endoscopy;  Laterality: N/A;  . ESOPHAGEAL BANDING N/A 03/18/2017   Procedure: ESOPHAGEAL BANDING;  Surgeon: Daneil Dolin, MD;  [facility];  Service: Endoscopy;  Laterality: N/A;  . ESOPHAGOGASTRODUODENOSCOPY  08/2006   barretts esophagus, no dyplasia  . ESOPHAGOGASTRODUODENOSCOPY  03/2006   barretts esophagus,bx focal atypia c/w low grade dysplasia, SB bx negative for celiac  . ESOPHAGOGASTRODUODENOSCOPY  09/23/09   3 columns of grade 2 esophageal varices/hiatal hernia/3-4 cm segment Barrett's esophagus without dysplasia. Next EGD 09/2012  . ESOPHAGOGASTRODUODENOSCOPY N/A 11/11/2012   Dr. Gala Romney- Barrett;s esophagus, esophageal varices, portal gastopathy. hiatal hernia  . ESOPHAGOGASTRODUODENOSCOPY N/A 04/30/2016   Procedure: ESOPHAGOGASTRODUODENOSCOPY (EGD);  Surgeon: Daneil Dolin, MD;  [facility];  Service: Endoscopy;  Laterality: N/A;  815  . ESOPHAGOGASTRODUODENOSCOPY N/A 12/20/2016   Procedure: ESOPHAGOGASTRODUODENOSCOPY (EGD);  Surgeon: Danie Binder, MD;  [facility];  Service: Endoscopy;  Laterality: N/A;  . ESOPHAGOGASTRODUODENOSCOPY N/A 01/07/2017   Procedure: ESOPHAGOGASTRODUODENOSCOPY (EGD);  Surgeon: Daneil Dolin, MD;  [facility];  Service: Endoscopy;  Laterality: N/A;  . ESOPHAGOGASTRODUODENOSCOPY N/A 03/18/2017   Procedure: ESOPHAGOGASTRODUODENOSCOPY (EGD);  Surgeon: Daneil Dolin, MD;  [facility];  Service: Endoscopy;  Laterality: N/A;  1030   . [place]  2008  . small bowel capsule endoscopy  10/2009  normal    Prior to Admission medications   Medication Sig Start Date End Date Taking? Authorizing Provider  cyanocobalamin (,VITAMIN B-12,) 1000 MCG/ML injection Inject 1,000 mcg into the muscle every 30 (thirty) days.    Yes [provider]  GLIPIZIDE XL 10 MG 24 hr tablet Take 10 mg by mouth daily.  02/24/12   Yes [provider]  lisinopril (PRINIVIL,ZESTRIL) 10 MG tablet Take 10 mg by mouth daily.  04/09/12  Yes [provider]  metFORMIN (GLUCOPHAGE) 1000 MG tablet Take 1,000 mg by mouth 2 (two) times daily with a meal.  02/09/12  Yes [provider]  pantoprazole (PROTONIX) 40 MG tablet Take 1 tablet (40 mg total) by mouth 2 (two) times daily before a meal. 12/24/16  Yes Orvan Falconer, MD  propranolol (INDERAL) 10 MG tablet Take 3 tablets in the morning and 3 in the evening Patient taking differently: Take 30 mg by mouth 2 (two) times daily.  03/22/17  Yes Mahala Menghini, PA-C  TRESIBA FLEXTOUCH 200 UNIT/ML SOPN Inject 44 Units into the skin daily.  04/08/17  Yes [provider]  VICTOZA 18 MG/3ML SOLN Inject 1.8 mg into the skin daily.  04/15/12  Yes [provider]    Allergies as of 11/08/2017 - Review Complete 11/08/2017  Allergen Reaction Noted  . Codeine Nausea And Vomiting 04/19/2012    Family History  Problem Relation Age of Onset  . Alzheimer's disease Mother   . CAD Father   . Diabetes Mellitus II Father   . Alzheimer's disease Father   . Diabetes Mellitus II Sister   . Colon cancer Sister 51       small, surgical excision; chemo/rad not needed  . Cancer - Other Brother   . Diabetes Mellitus II Brother   . Hypertension Brother   . Liver disease Neg Hx     Social History   Socioeconomic History  . Marital status: Married    Spouse name: Not on file  . Number of children: Not on file  . Years of education: Not on file  . Highest education level: Not on file  Occupational History  . Not on file  Social Needs  . Financial resource strain: Not on file  . Food insecurity:    Worry: Not on file    Inability: Not on file  . Transportation needs:    Medical: Not on file    Non-medical: Not on file  Tobacco Use  . Smoking status: Never Smoker  . Smokeless tobacco: Never Used  Substance and Sexual Activity  . Alcohol use: No  . Drug  use: No  . Sexual activity: Yes  Lifestyle  . Physical activity:    Days per week: Not on file    Minutes per session: Not on file  . Stress: Not on file  Relationships  . Social connections:    Talks on phone: Not on file    Gets together: Not on file    Attends religious service: Not on file    Active member of club or organization: Not on file    Attends meetings of clubs or organizations: Not on file    Relationship status: Not on file  . Intimate partner violence:    Fear of current or ex partner: Not on file    Emotionally abused: Not on file    Physically abused: Not on file    Forced sexual activity: Not on file  Other Topics Concern  . Not  on file  Social History Narrative  . Not on file    Review of Systems: See HPI, otherwise negative ROS  Physical Exam: BP (!) 135/55   Pulse 71   Temp 98.1 F (36.7 C) (Oral)   Resp 17   Ht 5' 1"  (1.549 m)   Wt 181 lb (82.1 kg)   SpO2 99%   BMI 34.20 kg/m  General:   Alert,   pleasant and cooperative in NAD Skin:  Intact without significant lesions or rashes.  Lungs:  Clear throughout to auscultation.   No wheezes, crackles, or rhonchi. No acute distress. Heart:  Regular rate and rhythm; no murmurs, clicks, rubs,  or gallops. Abdomen: Non-distended, normal bowel sounds.  Soft and nontender without appreciable mass or hepatosplenomegaly.  Pulses:  Normal pulses noted. Extremities:  Without clubbing or edema.  Impression:  67 year old lady with Nash/cirrhosis and varices. Status post partial obliteration with EBL. She is here for follow-up examination.   Recommendations: The risks, benefits, limitations, alternatives and imponderables have been reviewed with the patient. Potential for esophageal dilation, biopsy, etc. have also been reviewed.  Questions have been answered. All parties agreeable.  Notice: This dictation was prepared with Dragon dictation along with smaller phrase technology. Any transcriptional errors  that result from this process are unintentional and may not be corrected upon review.

## 2017-12-17 NOTE — Discharge Instructions (Addendum)
EGD Discharge instructions Please read the instructions outlined below and refer to this sheet in the next few weeks. These discharge instructions provide you with general information on caring for yourself after you leave the hospital. Your doctor may also give you specific instructions. While your treatment has been planned according to the most current medical practices available, unavoidable complications occasionally occur. If you have any problems or questions after discharge, please call your doctor. ACTIVITY  You may resume your regular activity but move at a slower pace for the next 24 hours.   Take frequent rest periods for the next 24 hours.   Walking will help expel (get rid of) the air and reduce the bloated feeling in your abdomen.   No driving for 24 hours (because of the anesthesia (medicine) used during the test).   You may shower.   Do not sign any important legal documents or operate any machinery for 24 hours (because of the anesthesia used during the test).  NUTRITION  Drink plenty of fluids.   You may resume your normal diet.   Begin with a light meal and progress to your normal diet.   Avoid alcoholic beverages for 24 hours or as instructed by your caregiver.  MEDICATIONS  You may resume your normal medications unless your caregiver tells you otherwise.  WHAT YOU CAN EXPECT TODAY  You may experience abdominal discomfort such as a feeling of fullness or gas pains.  FOLLOW-UP  Your doctor will discuss the results of your test with you.  SEEK IMMEDIATE MEDICAL ATTENTION IF ANY OF THE FOLLOWING OCCUR:  Excessive nausea (feeling sick to your stomach) and/or vomiting.   Severe abdominal pain and distention (swelling).   Trouble swallowing.   Temperature over 101 F (37.8 C).   Rectal bleeding or vomiting of blood.    Chloraseptic spray as needed for sore throat Carafate suspension to coat the esophagus 4 times a day x 5 days Light lunch and supper  today; slowly advance diet as tolerated over the subsequent days. Office visit with [name] in 3 months. July 15 @ 10:00AM Alisha [name]

## 2017-12-17 NOTE — Op Note (Signed)
[facility] Patient Name: Alisha [name] Procedure Date: 12/17/2017 10:03 AM MRN: [id number] Date of Birth: 1951/08/10 Attending MD: Norvel Richards , MD CSN: [id number] Age: 67 Admit Type: Outpatient Procedure:                Upper GI endoscopy Indications:              Surveillance procedure Providers:                Norvel Richards, MD, Janeece Riggers, RN, Nelma Rothman, Technician Referring MD:              Medicines:                Midazolam 3 mg IV, Meperidine 75 mg IV Complications:            No immediate complications. Estimated Blood Loss:     Estimated blood loss was minimal. Procedure:                Pre-Anesthesia Assessment:                           - Prior to the procedure, a History and Physical                            was performed, and patient medications and                            allergies were reviewed. The patient's tolerance of                            previous anesthesia was also reviewed. The risks                            and benefits of the procedure and the sedation                            options and risks were discussed with the patient.                            All questions were answered, and informed consent                            was obtained. Prior Anticoagulants: The patient has                            taken no previous anticoagulant or antiplatelet                            agents. ASA Grade Assessment: III - A patient with                            severe systemic disease. After reviewing the risks  and benefits, the patient was deemed in                            satisfactory condition to undergo the procedure.                           After obtaining informed consent, the endoscope was                            passed under direct vision. Throughout the                            procedure, the patient's blood pressure, pulse, and       oxygen saturations were monitored continuously. The                            [id number] ([id number]) scope was introduced through the                            mouth, and advanced to the second part of duodenum.                            The upper GI endoscopy was somewhat difficult. The                            patient tolerated the procedure well. 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 4 columns?"grade 2. No bleeding       stigmata. skinny tongue of salmon-colored epithelium coming up 3 cm       above the GE junction consistent with prior diagnosis of Barrett's       esophagus. . Estimated blood loss was minimal. .      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 Scope was       removed. 7 shot bander was attached. Some difficulty in reactivating the       esophagus. Subsequently, 5 bands placed on 4 columns. Good hemostasis       maintained. Five bands were successfully placed with complete       eradication, resulting in deflation of varices      The duodenal bulb and second portion of the duodenum were normal. Impression:               - Esophageal varices. Completely eradicated.                            Banded. Short segment Barrett's esophagus not                            biopsied.                           - Portal hypertensive gastropathy.                           - Normal duodenal  bulb and second portion of the                            duodenum.                           - No specimens collected. Moderate Sedation:      Moderate (conscious) sedation was administered by the endoscopy nurse       and supervised by the endoscopist. The following parameters were       monitored: oxygen saturation, heart rate, blood pressure, respiratory       rate, EKG, adequacy of pulmonary ventilation, and response to care.       Total physician  intraservice time was 15 minutes. Recommendation:           - Patient has a contact number available for                            emergencies. The signs and symptoms of potential                            delayed complications were discussed with the                            patient. Return to normal activities tomorrow.                            Written discharge instructions were provided to the                            patient.                           - Resume previous diet. Chloraseptic Spray when                            necessary sore throat. Carafate suspension 1 g 4                            times a day -5 days.                           - Continue present medications. Continue NSBB, PPI                           - Repeat upper endoscopy in 1 year for surveillance.                           - Return to GI clinic in 3 months. Procedure Code(s):        --- Professional ---                           [id number], Esophagogastroduodenoscopy, flexible,                            transoral; with band ligation of esophageal/gastric  varices                           G0500, Moderate sedation services provided by the                            same physician or other qualified health care                            professional performing a gastrointestinal                            endoscopic service that sedation supports,                            requiring the presence of an independent trained                            observer to assist in the monitoring of the                            patient's level of consciousness and physiological                            status; initial 15 minutes of intra-service time;                            patient age 8 years or older (additional time may                            be reported with [phone], as appropriate) Diagnosis Code(s):        --- Professional ---                           I85.00, Esophageal  varices without bleeding                           K76.6, Portal hypertension                           K31.89, Other diseases of stomach and duodenum CPT copyright 2017 American Medical Association. All rights reserved. The codes documented in this report are preliminary and upon coder review may  be revised to meet current compliance requirements. Cristopher Estimable. Marwin Primmer, MD Norvel Richards, MD 12/17/2017 11:10:45 AM This report has been signed electronically. Number of Addenda: 0

## 2017-12-20 ENCOUNTER — Encounter (HOSPITAL_COMMUNITY): Payer: Self-pay | Admitting: Hematology

## 2017-12-20 ENCOUNTER — Inpatient Hospital Stay (HOSPITAL_COMMUNITY): Payer: Medicare Other | Attending: Internal Medicine

## 2017-12-20 ENCOUNTER — Other Ambulatory Visit (HOSPITAL_COMMUNITY): Payer: Self-pay | Admitting: Pharmacist

## 2017-12-20 ENCOUNTER — Inpatient Hospital Stay (HOSPITAL_COMMUNITY): Payer: Medicare Other

## 2017-12-20 ENCOUNTER — Inpatient Hospital Stay (HOSPITAL_BASED_OUTPATIENT_CLINIC_OR_DEPARTMENT_OTHER): Payer: Medicare Other | Admitting: Hematology

## 2017-12-20 VITALS — BP 131/73 | HR 73 | Temp 97.9°F | Resp 16 | Wt 188.0 lb

## 2017-12-20 DIAGNOSIS — K3189 Other diseases of stomach and duodenum: Secondary | ICD-10-CM | POA: Insufficient documentation

## 2017-12-20 DIAGNOSIS — E538 Deficiency of other specified B group vitamins: Secondary | ICD-10-CM

## 2017-12-20 DIAGNOSIS — D508 Other iron deficiency anemias: Secondary | ICD-10-CM

## 2017-12-20 DIAGNOSIS — I10 Essential (primary) hypertension: Secondary | ICD-10-CM | POA: Insufficient documentation

## 2017-12-20 DIAGNOSIS — K2270 Barrett's esophagus without dysplasia: Secondary | ICD-10-CM

## 2017-12-20 DIAGNOSIS — D500 Iron deficiency anemia secondary to blood loss (chronic): Secondary | ICD-10-CM | POA: Insufficient documentation

## 2017-12-20 DIAGNOSIS — K449 Diaphragmatic hernia without obstruction or gangrene: Secondary | ICD-10-CM | POA: Diagnosis not present

## 2017-12-20 DIAGNOSIS — K766 Portal hypertension: Secondary | ICD-10-CM | POA: Insufficient documentation

## 2017-12-20 DIAGNOSIS — R630 Anorexia: Secondary | ICD-10-CM | POA: Insufficient documentation

## 2017-12-20 DIAGNOSIS — K7460 Unspecified cirrhosis of liver: Secondary | ICD-10-CM | POA: Insufficient documentation

## 2017-12-20 DIAGNOSIS — Z79899 Other long term (current) drug therapy: Secondary | ICD-10-CM | POA: Diagnosis not present

## 2017-12-20 DIAGNOSIS — K219 Gastro-esophageal reflux disease without esophagitis: Secondary | ICD-10-CM | POA: Insufficient documentation

## 2017-12-20 DIAGNOSIS — Z86010 Personal history of colonic polyps: Secondary | ICD-10-CM

## 2017-12-20 LAB — CBC WITH DIFFERENTIAL/PLATELET
Basophils Absolute: 0 10*3/uL (ref 0.0–0.1)
Basophils Relative: 0 %
Eosinophils Absolute: 0.2 10*3/uL (ref 0.0–0.7)
Eosinophils Relative: 4 %
HEMATOCRIT: 33.1 % — AB (ref 36.0–46.0)
HEMOGLOBIN: 11 g/dL — AB (ref 12.0–15.0)
LYMPHS ABS: 1 10*3/uL (ref 0.7–4.0)
LYMPHS PCT: 22 %
MCH: 30.5 pg (ref 26.0–34.0)
MCHC: 33.2 g/dL (ref 30.0–36.0)
MCV: 91.7 fL (ref 78.0–100.0)
MONO ABS: 0.4 10*3/uL (ref 0.1–1.0)
MONOS PCT: 9 %
NEUTROS ABS: 2.8 10*3/uL (ref 1.7–7.7)
NEUTROS PCT: 65 %
Platelets: 49 10*3/uL — ABNORMAL LOW (ref 150–400)
RBC: 3.61 MIL/uL — ABNORMAL LOW (ref 3.87–5.11)
RDW: 13.7 % (ref 11.5–15.5)
WBC: 4.4 10*3/uL (ref 4.0–10.5)

## 2017-12-20 LAB — IRON AND TIBC
Iron: 55 ug/dL (ref 28–170)
Saturation Ratios: 17 % (ref 10.4–31.8)
TIBC: 315 ug/dL (ref 250–450)
UIBC: 260 ug/dL

## 2017-12-20 LAB — COMPREHENSIVE METABOLIC PANEL
ALBUMIN: 3.7 g/dL (ref 3.5–5.0)
ALK PHOS: 65 U/L (ref 38–126)
ALT: 22 U/L (ref 14–54)
ANION GAP: 10 (ref 5–15)
AST: 31 U/L (ref 15–41)
BILIRUBIN TOTAL: 2.4 mg/dL — AB (ref 0.3–1.2)
BUN: 15 mg/dL (ref 6–20)
CALCIUM: 9.1 mg/dL (ref 8.9–10.3)
CO2: 21 mmol/L — ABNORMAL LOW (ref 22–32)
Chloride: 109 mmol/L (ref 101–111)
Creatinine, Ser: 1.32 mg/dL — ABNORMAL HIGH (ref 0.44–1.00)
GFR, EST AFRICAN AMERICAN: 47 mL/min — AB (ref >60)
GFR, EST NON AFRICAN AMERICAN: 41 mL/min — AB (ref >60)
GLUCOSE: 82 mg/dL (ref 65–99)
Potassium: 4.4 mmol/L (ref 3.5–5.1)
Sodium: 140 mmol/L (ref 135–145)
Total Protein: 6.9 g/dL (ref 6.5–8.1)

## 2017-12-20 LAB — FERRITIN: Ferritin: 71 ng/mL (ref 11–307)

## 2017-12-20 LAB — VITAMIN B12: VITAMIN B 12: 497 pg/mL (ref 180–914)

## 2017-12-20 MED ORDER — SODIUM CHLORIDE 0.9 % IV SOLN
510.0000 mg | INTRAVENOUS | Status: DC
  Filled 2017-12-20: qty 17

## 2017-12-20 MED ORDER — CYANOCOBALAMIN 1000 MCG/ML IJ SOLN
1000.0000 ug | Freq: Once | INTRAMUSCULAR | Status: AC
  Administered 2017-12-20: 1000 ug via INTRAMUSCULAR

## 2017-12-20 MED ORDER — CYANOCOBALAMIN 1000 MCG/ML IJ SOLN
INTRAMUSCULAR | Status: AC
  Filled 2017-12-20: qty 1

## 2017-12-20 NOTE — Patient Instructions (Signed)
Alisha [name] at [facility] Discharge Instructions  You saw Dr. Delton Coombes today.   Thank you for choosing [name] at [facility] to provide your oncology and hematology care.  To afford each patient quality time with our provider, please arrive at least 15 minutes before your scheduled appointment time.   If you have a lab appointment with the [facility] please come in thru the  Main Entrance and check in at the main information desk  You need to re-schedule your appointment should you arrive 10 or more minutes late.  We strive to give you quality time with our providers, and arriving late affects you and other patients whose appointments are after yours.  Also, if you no show three or more times for appointments you may be dismissed from the clinic at the providers discretion.     Again, thank you for choosing [facility].  Our hope is that these requests will decrease the amount of time that you wait before being seen by our physicians.       _____________________________________________________________  Should you have questions after your visit to [facility], please contact our office at [phone] between the hours of 8:30 a.m. and 4:30 p.m.  Voicemails left after 4:30 p.m. will not be returned until the following business day.  For prescription refill requests, have your pharmacy contact our office.       Resources For Cancer Patients and their Caregivers ? American Cancer Society: Can assist with transportation, wigs, general needs, runs Look Good Feel Better.        671-635-1475 ? Cancer Care: Provides financial assistance, online support groups, medication/co-pay assistance.  1-800-813-HOPE 713-605-9618) ? [place] Assists [place] cancer patients and their families through emotional , educational and financial support.  254-409-3996 ? [place] DSS Where to apply for  food stamps, Medicaid and utility assistance. 938 548 4352 ? RCATS: Transportation to medical appointments. 716-317-2292 ? Social Security Administration: May apply for disability if have a Stage IV cancer. [phone])839-3849 [phone])855-2497 ? [employer], Disability and Transit Services: Assists with nutrition, care and transit needs. [place] Support Programs:   > Cancer Support Group  2nd Tuesday of the month 1pm-2pm, Journey Room   > Creative Journey  3rd Tuesday of the month 1130am-1pm, Journey Room

## 2017-12-20 NOTE — Progress Notes (Signed)
[place] [address], [place] [address]   CLINIC:  Medical Oncology/Hematology  PCP:  Glenda Chroman, MD [place] [phone]   REASON FOR VISIT:  Follow-up for iron deficiency anemia.  CURRENT THERAPY: Last Feraheme infusion in October 2018    INTERVAL HISTORY:  Ms. Schomer 67 y.o. female returns for routine follow-up for iron deficiency anemia.  She has received 2 Feraheme infusions in October 2018.  After that her hemoglobin improved to 12.8.  Her last hemoglobin was 11.8.  She feels somewhat tired.  She also complains of decreased appetite.  No bleeding per rectum or melena was reported.  No fevers or night sweats or weight loss.  No recent hospitalizations.  REVIEW OF SYSTEMS:  Review of Systems  Constitutional: Positive for appetite change and fatigue.  All other systems reviewed and are negative.    PAST MEDICAL/SURGICAL HISTORY:  Past Medical History:  Diagnosis Date  . B12 deficiency 11/03/2016  . B12 deficiency 11/03/2016  . Barrett's esophagus   . Cirrhosis of liver without mention of alcohol    hep B surface antigen and HCV ab negative.pt has not had hepatitis A and B vaccines.U/S on 07/04/13 shows cirrhosis.  . Diverticula of colon    pancolonic  . Esophageal reflux   . Esophageal varices ([place])   . Hiatal hernia   . Iron deficiency anemia, unspecified   . Other and unspecified hyperlipidemia   . Portal hypertensive gastropathy ([place])   . Tubular adenoma   . Type II or unspecified type diabetes mellitus without mention of complication, not stated as uncontrolled   . Unspecified essential hypertension   . Unspecified hemorrhoids without mention of complication    Past Surgical History:  Procedure Laterality Date  . ABDOMINAL HYSTERECTOMY    . BIOPSY  04/30/2016   Procedure: BIOPSY;  Surgeon: Daneil Dolin, MD;  [facility];  Service: Endoscopy;;  esophagus  . CESAREAN SECTION     x 2  .  COLONOSCOPY  03/2006   left sided diverticula, splenic flexure tublar adenoma  . COLONOSCOPY  07/2002   villous tubular adenoma in rectum  . COLONOSCOPY  09/23/09   external hemorrhoids/scattered pan colonic diverticula otherwise normal. cecal lipoma bx negative, normal TI. Next TCS 09/2014  . COLONOSCOPY N/A 11/12/2014   Procedure: COLONOSCOPY;  Surgeon: Daneil Dolin, MD;  [facility];  Service: Endoscopy;  Laterality: N/A;  1215 - moved to 12:30 - Ginger to notify pt  . COLONOSCOPY N/A 01/07/2017   Procedure: COLONOSCOPY;  Surgeon: Daneil Dolin, MD;  [facility];  Service: Endoscopy;  Laterality: N/A;  3:15PM  . ESOPHAGEAL BANDING N/A 12/20/2016   Procedure: ESOPHAGEAL BANDING;  Surgeon: Danie Binder, MD;  [facility];  Service: Endoscopy;  Laterality: N/A;  . ESOPHAGEAL BANDING N/A 03/18/2017   Procedure: ESOPHAGEAL BANDING;  Surgeon: Daneil Dolin, MD;  [facility];  Service: Endoscopy;  Laterality: N/A;  . ESOPHAGOGASTRODUODENOSCOPY  08/2006   barretts esophagus, no dyplasia  . ESOPHAGOGASTRODUODENOSCOPY  03/2006   barretts esophagus,bx focal atypia c/w low grade dysplasia, SB bx negative for celiac  . ESOPHAGOGASTRODUODENOSCOPY  09/23/09   3 columns of grade 2 esophageal varices/hiatal hernia/3-4 cm segment Barrett's esophagus without dysplasia. Next EGD 09/2012  . ESOPHAGOGASTRODUODENOSCOPY N/A 11/11/2012   Dr. Gala Romney- Barrett;s esophagus, esophageal varices, portal gastopathy. hiatal hernia  . ESOPHAGOGASTRODUODENOSCOPY N/A 04/30/2016   Procedure: ESOPHAGOGASTRODUODENOSCOPY (  EGD);  Surgeon: Daneil Dolin, MD;  [facility];  Service: Endoscopy;  Laterality: N/A;  815  . ESOPHAGOGASTRODUODENOSCOPY N/A 12/20/2016   Procedure: ESOPHAGOGASTRODUODENOSCOPY (EGD);  Surgeon: Danie Binder, MD;  [facility];  Service: Endoscopy;  Laterality: N/A;  . ESOPHAGOGASTRODUODENOSCOPY N/A 01/07/2017   Procedure:  ESOPHAGOGASTRODUODENOSCOPY (EGD);  Surgeon: Daneil Dolin, MD;  [facility];  Service: Endoscopy;  Laterality: N/A;  . ESOPHAGOGASTRODUODENOSCOPY N/A 03/18/2017   Procedure: ESOPHAGOGASTRODUODENOSCOPY (EGD);  Surgeon: Daneil Dolin, MD;  [facility];  Service: Endoscopy;  Laterality: N/A;  1030   . [place]  2008  . small bowel capsule endoscopy  10/2009   normal     SOCIAL HISTORY:  Social History   Socioeconomic History  . Marital status: Married    Spouse name: Not on file  . Number of children: Not on file  . Years of education: Not on file  . Highest education level: Not on file  Occupational History  . Not on file  Social Needs  . Financial resource strain: Not on file  . Food insecurity:    Worry: Not on file    Inability: Not on file  . Transportation needs:    Medical: Not on file    Non-medical: Not on file  Tobacco Use  . Smoking status: Never Smoker  . Smokeless tobacco: Never Used  Substance and Sexual Activity  . Alcohol use: No  . Drug use: No  . Sexual activity: Yes  Lifestyle  . Physical activity:    Days per week: Not on file    Minutes per session: Not on file  . Stress: Not on file  Relationships  . Social connections:    Talks on phone: Not on file    Gets together: Not on file    Attends religious service: Not on file    Active member of club or organization: Not on file    Attends meetings of clubs or organizations: Not on file    Relationship status: Not on file  . Intimate partner violence:    Fear of current or ex partner: Not on file    Emotionally abused: Not on file    Physically abused: Not on file    Forced sexual activity: Not on file  Other Topics Concern  . Not on file  Social History Narrative  . Not on file    FAMILY HISTORY:  Family History  Problem Relation Age of Onset  . Alzheimer's disease Mother   . CAD Father   . Diabetes Mellitus II Father   . Alzheimer's disease Father     . Diabetes Mellitus II Sister   . Colon cancer Sister 67       small, surgical excision; chemo/rad not needed  . Cancer - Other Brother   . Diabetes Mellitus II Brother   . Hypertension Brother   . Liver disease Neg Hx     CURRENT MEDICATIONS:  Outpatient Encounter Medications as of 12/20/2017  Medication Sig  . cyanocobalamin (,VITAMIN B-12,) 1000 MCG/ML injection Inject 1,000 mcg into the muscle every 30 (thirty) days.   Marland Kitchen GLIPIZIDE XL 10 MG 24 hr tablet Take 10 mg by mouth daily.   Marland Kitchen lisinopril (PRINIVIL,ZESTRIL) 10 MG tablet Take 10 mg by mouth daily.   . metFORMIN (GLUCOPHAGE) 1000 MG tablet Take 1,000 mg by mouth 2 (two) times daily with a meal.   . pantoprazole (PROTONIX) 40 MG tablet Take 1  tablet (40 mg total) by mouth 2 (two) times daily before a meal.  . propranolol (INDERAL) 10 MG tablet Take 3 tablets in the morning and 3 in the evening (Patient taking differently: Take 30 mg by mouth 2 (two) times daily. )  . TRESIBA FLEXTOUCH 200 UNIT/ML SOPN Inject 44 Units into the skin daily.   Marland Kitchen VICTOZA 18 MG/3ML SOLN Inject 1.8 mg into the skin daily.    Facility-Administered Encounter Medications as of 12/20/2017  Medication  . [COMPLETED] cyanocobalamin ((VITAMIN B-12)) injection 1,000 mcg  . ferumoxytol (FERAHEME) 510 mg in sodium chloride 0.9 % 100 mL IVPB    ALLERGIES:  Allergies  Allergen Reactions  . Codeine Nausea And Vomiting     PHYSICAL EXAM:  ECOG Performance status: 1  Vitals:   12/20/17 1041  BP: 131/73  Pulse: 73  Resp: 16  Temp: 97.9 F (36.6 C)  SpO2: 98%   Filed Weights   12/20/17 1041  Weight: 188 lb (85.3 kg)      LABORATORY DATA:  I have reviewed the labs as listed.  CBC    Component Value Date/Time   WBC 4.4 12/20/2017 1023   RBC 3.61 (L) 12/20/2017 1023   HGB 11.0 (L) 12/20/2017 1023   HCT 33.1 (L) 12/20/2017 1023   PLT 49 (L) 12/20/2017 1023   MCV 91.7 12/20/2017 1023   MCH 30.5 12/20/2017 1023   MCHC 33.2 12/20/2017 1023    RDW 13.7 12/20/2017 1023   LYMPHSABS 1.0 12/20/2017 1023   MONOABS 0.4 12/20/2017 1023   EOSABS 0.2 12/20/2017 1023   BASOSABS 0.0 12/20/2017 1023   CMP Latest Ref Rng & Units 12/20/2017 11/18/2017 09/20/2017  Glucose 65 - 99 mg/dL 82 218(H) 210(H)  BUN 6 - 20 mg/dL 15 17 13   Creatinine 0.44 - 1.00 mg/dL 1.32(H) 1.37(H) 1.17(H)  Sodium 135 - 145 mmol/L 140 137 136  Potassium 3.5 - 5.1 mmol/L 4.4 5.1 4.2  Chloride 101 - 111 mmol/L 109 108 107  CO2 22 - 32 mmol/L 21(L) 20(L) 20(L)  Calcium 8.9 - 10.3 mg/dL 9.1 10.0 9.7  Total Protein 6.5 - 8.1 g/dL 6.9 7.5 7.3  Total Bilirubin 0.3 - 1.2 mg/dL 2.4(H) 2.6(H) 3.7(H)  Alkaline Phos 38 - 126 U/L 65 69 68  AST 15 - 41 U/L 31 37 41  ALT 14 - 54 U/L 22 25 28          ASSESSMENT & PLAN:   Iron deficiency anemia 1.  Normocytic anemia: This is from a combination of iron deficiency state from chronic blood loss (bleeding varices) and chronic kidney disease.  Her last Feraheme infusion was on 06/25/2017.  Her hemoglobin improved to 12.8, and lately has been steadily coming down.  Today her hemoglobin is 11.  Ferritin is pending from today.  I have recommended to weekly Feraheme infusions as maintenance.  We will schedule her within a week.  I will see her back in 3 months with repeat blood work.  2.  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  Platelet count is stable around 50,000.  3.  Elevated creatinine: Her creatinine has been elevated over the past few months, likely from chronic kidney disease.  She has an appointment to see nephrology soon.  4.  Cirrhosis: She follows up with liver transplant program at [facility].      Orders placed this encounter:  Orders Placed This Encounter  Procedures  . CBC with Differential  .  Ferritin  . Iron and TIBC      Derek Jack, MD [place] [phone]

## 2017-12-20 NOTE — Assessment & Plan Note (Signed)
1.  Normocytic anemia: This is from a combination of iron deficiency state from chronic blood loss (bleeding varices) and chronic kidney disease.  Her last Feraheme infusion was on 06/25/2017.  Her hemoglobin improved to 12.8, and lately has been steadily coming down.  Today her hemoglobin is 11.  Ferritin is pending from today.  I have recommended to weekly Feraheme infusions as maintenance.  We will schedule her within a week.  I will see her back in 3 months with repeat blood work.  2.  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  Platelet count is stable around 50,000.  3.  Elevated creatinine: Her creatinine has been elevated over the past few months, likely from chronic kidney disease.  She has an appointment to see nephrology soon.  4.  Cirrhosis: She follows up with liver transplant program at [facility].

## 2017-12-20 NOTE — Progress Notes (Signed)
Alisha [name] presents today for injection per the provider's orders.  B12 administration without incident; see MAR for injection details.  Patient tolerated procedure well and without incident.  No questions or complaints noted at this time.  Discharged ambulatory in c/o spouse.

## 2017-12-23 ENCOUNTER — Encounter (HOSPITAL_COMMUNITY): Payer: Self-pay | Admitting: Internal Medicine

## 2017-12-24 ENCOUNTER — Inpatient Hospital Stay (HOSPITAL_COMMUNITY): Payer: Medicare Other

## 2017-12-24 ENCOUNTER — Encounter (HOSPITAL_COMMUNITY): Payer: Self-pay

## 2017-12-24 ENCOUNTER — Other Ambulatory Visit: Payer: Self-pay

## 2017-12-24 VITALS — BP 131/54 | HR 76 | Temp 98.2°F | Resp 18 | Wt 187.6 lb

## 2017-12-24 DIAGNOSIS — E538 Deficiency of other specified B group vitamins: Secondary | ICD-10-CM

## 2017-12-24 DIAGNOSIS — D500 Iron deficiency anemia secondary to blood loss (chronic): Secondary | ICD-10-CM | POA: Diagnosis not present

## 2017-12-24 MED ORDER — SODIUM CHLORIDE 0.9 % IV SOLN
510.0000 mg | Freq: Once | INTRAVENOUS | Status: AC
  Administered 2017-12-24: 510 mg via INTRAVENOUS
  Filled 2017-12-24: qty 17

## 2017-12-24 MED ORDER — SODIUM CHLORIDE 0.9 % IV SOLN
INTRAVENOUS | Status: DC
  Administered 2017-12-24: 13:00:00 via INTRAVENOUS

## 2017-12-24 NOTE — Progress Notes (Signed)
Tolerated infusion w/o adverse reaction.  Alert, in no distress.  VSS.  Discharged ambulatory in c/o spouse.  

## 2018-01-03 ENCOUNTER — Inpatient Hospital Stay (HOSPITAL_COMMUNITY): Payer: Medicare Other

## 2018-01-03 ENCOUNTER — Encounter (HOSPITAL_COMMUNITY): Payer: Self-pay

## 2018-01-03 ENCOUNTER — Other Ambulatory Visit: Payer: Self-pay

## 2018-01-03 VITALS — BP 112/56 | HR 67 | Temp 97.8°F | Resp 18

## 2018-01-03 DIAGNOSIS — E538 Deficiency of other specified B group vitamins: Secondary | ICD-10-CM

## 2018-01-03 DIAGNOSIS — D500 Iron deficiency anemia secondary to blood loss (chronic): Secondary | ICD-10-CM | POA: Diagnosis not present

## 2018-01-03 MED ORDER — SODIUM CHLORIDE 0.9 % IV SOLN
INTRAVENOUS | Status: DC
  Administered 2018-01-03: 14:00:00 via INTRAVENOUS

## 2018-01-03 MED ORDER — SODIUM CHLORIDE 0.9 % IV SOLN
510.0000 mg | Freq: Once | INTRAVENOUS | Status: AC
  Administered 2018-01-03: 510 mg via INTRAVENOUS
  Filled 2018-01-03: qty 17

## 2018-01-03 NOTE — Patient Instructions (Signed)
[place] at [facility] Discharge Instructions  Feraheme given today  Follow up as scheduled.   Thank you for choosing [place] at [facility] to provide your oncology and hematology care.  To afford each patient quality time with our provider, please arrive at least 15 minutes before your scheduled appointment time.   If you have a lab appointment with the [facility] please come in thru the  Main Entrance and check in at the main information desk  You need to re-schedule your appointment should you arrive 10 or more minutes late.  We strive to give you quality time with our providers, and arriving late affects you and other patients whose appointments are after yours.  Also, if you no show three or more times for appointments you may be dismissed from the clinic at the providers discretion.     Again, thank you for choosing [facility].  Our hope is that these requests will decrease the amount of time that you wait before being seen by our physicians.       _____________________________________________________________  Should you have questions after your visit to [facility], please contact our office at [phone] between the hours of 8:30 a.m. and 4:30 p.m.  Voicemails left after 4:30 p.m. will not be returned until the following business day.  For prescription refill requests, have your pharmacy contact our office.       Resources For Cancer Patients and their Caregivers ? American Cancer Society: Can assist with transportation, wigs, general needs, runs Look Good Feel Better.        [phone] ? Cancer Care: Provides financial assistance, online support groups, medication/co-pay assistance.  1-800-813-HOPE 704-068-5569) ? [place] Assists [place] cancer patients and their families through emotional , educational and financial support.  727-273-0943 ? [place]  DSS Where to apply for food stamps, Medicaid and utility assistance. 914-170-2794 ? RCATS: Transportation to medical appointments. (606) [phone] ? Social Security Administration: May apply for disability if have a Stage IV cancer. [phone])771-2920 [phone])[phone] ? [employer], Disability and Transit Services: Assists with nutrition, care and transit needs. [place] Support Programs:   > Cancer Support Group  2nd Tuesday of the month 1pm-2pm, Journey Room   > Creative Journey  3rd Tuesday of the month 1130am-1pm, Journey Room

## 2018-01-19 ENCOUNTER — Inpatient Hospital Stay (HOSPITAL_COMMUNITY): Payer: Medicare Other | Attending: Oncology

## 2018-01-19 ENCOUNTER — Encounter (HOSPITAL_COMMUNITY): Payer: Self-pay

## 2018-01-19 VITALS — BP 120/54 | HR 67 | Temp 97.7°F | Resp 18

## 2018-01-19 DIAGNOSIS — D538 Other specified nutritional anemias: Secondary | ICD-10-CM | POA: Diagnosis not present

## 2018-01-19 DIAGNOSIS — Z79899 Other long term (current) drug therapy: Secondary | ICD-10-CM | POA: Diagnosis not present

## 2018-01-19 DIAGNOSIS — E538 Deficiency of other specified B group vitamins: Secondary | ICD-10-CM

## 2018-01-19 MED ORDER — CYANOCOBALAMIN 1000 MCG/ML IJ SOLN
1000.0000 ug | Freq: Once | INTRAMUSCULAR | Status: AC
  Administered 2018-01-19: 1000 ug via INTRAMUSCULAR
  Filled 2018-01-19: qty 1

## 2018-01-19 NOTE — Patient Instructions (Signed)
[place] at [facility]  Discharge Instructions:  You received a b12 shot today.  _______________________________________________________________  Thank you for choosing [place] at [facility] to provide your oncology and hematology care.  To afford each patient quality time with our providers, please arrive at least 15 minutes before your scheduled appointment.  You need to re-schedule your appointment if you arrive 10 or more minutes late.  We strive to give you quality time with our providers, and arriving late affects you and other patients whose appointments are after yours.  Also, if you no show three or more times for appointments you may be dismissed from the clinic.  Again, thank you for choosing [place] at [place] hope is that these requests will allow you access to exceptional care and in a timely manner. _______________________________________________________________  If you have questions after your visit, please contact our office at [phone] between the hours of 8:30 a.m. and 5:00 p.m. Voicemails left after 4:30 p.m. will not be returned until the following business day. _______________________________________________________________  For prescription refill requests, have your pharmacy contact our office. _______________________________________________________________  Recommendations made by the consultant and any test results will be sent to your referring physician. _______________________________________________________________

## 2018-01-19 NOTE — Progress Notes (Signed)
Patient for vitamin b12 shot.  Injection site clean and dry with no bruising or swelling noted at site.  Band aid applied.  VSs with discharge and left ambulatory with no s/s of distress noted.  Husband with the patient.

## 2018-02-21 ENCOUNTER — Inpatient Hospital Stay (HOSPITAL_COMMUNITY): Payer: Medicare Other | Attending: Oncology

## 2018-02-21 ENCOUNTER — Encounter (HOSPITAL_COMMUNITY): Payer: Self-pay

## 2018-02-21 VITALS — BP 132/55 | Temp 97.4°F | Resp 18

## 2018-02-21 DIAGNOSIS — E538 Deficiency of other specified B group vitamins: Secondary | ICD-10-CM | POA: Diagnosis present

## 2018-02-21 DIAGNOSIS — Z79899 Other long term (current) drug therapy: Secondary | ICD-10-CM | POA: Insufficient documentation

## 2018-02-21 MED ORDER — CYANOCOBALAMIN 1000 MCG/ML IJ SOLN
1000.0000 ug | Freq: Once | INTRAMUSCULAR | Status: AC
  Administered 2018-02-21: 1000 ug via INTRAMUSCULAR
  Filled 2018-02-21: qty 1

## 2018-02-21 NOTE — Patient Instructions (Signed)
[place] at [facility]  Discharge Instructions:  You received a b12 shot today.  _______________________________________________________________  Thank you for choosing [place] at [facility] to provide your oncology and hematology care.  To afford each patient quality time with our providers, please arrive at least 15 minutes before your scheduled appointment.  You need to re-schedule your appointment if you arrive 10 or more minutes late.  We strive to give you quality time with our providers, and arriving late affects you and other patients whose appointments are after yours.  Also, if you no show three or more times for appointments you may be dismissed from the clinic.  Again, thank you for choosing [place] at [place] hope is that these requests will allow you access to exceptional care and in a timely manner. _______________________________________________________________  If you have questions after your visit, please contact our office at [phone] between the hours of 8:30 a.m. and 5:00 p.m. Voicemails left after 4:30 p.m. will not be returned until the following business day. _______________________________________________________________  For prescription refill requests, have your pharmacy contact our office. _______________________________________________________________  Recommendations made by the consultant and any test results will be sent to your referring physician. _______________________________________________________________

## 2018-02-21 NOTE — Progress Notes (Signed)
Patient tolerated b12 shot with no complaints voiced.  Injection site clean and dry with no bruising or swelling noted at site.  Band aid applied.  VSS with discharge and left ambulatory with family. No s/s of distress noted.

## 2018-03-09 ENCOUNTER — Other Ambulatory Visit: Payer: Self-pay | Admitting: Gastroenterology

## 2018-03-24 ENCOUNTER — Inpatient Hospital Stay (HOSPITAL_COMMUNITY): Payer: Medicare Other | Attending: Hematology

## 2018-03-24 ENCOUNTER — Inpatient Hospital Stay (HOSPITAL_COMMUNITY): Payer: Medicare Other | Admitting: Hematology

## 2018-03-24 ENCOUNTER — Inpatient Hospital Stay (HOSPITAL_COMMUNITY): Payer: Medicare Other

## 2018-03-24 ENCOUNTER — Other Ambulatory Visit: Payer: Self-pay

## 2018-03-24 ENCOUNTER — Encounter (HOSPITAL_COMMUNITY): Payer: Self-pay | Admitting: Hematology

## 2018-03-24 VITALS — BP 129/73 | HR 74 | Temp 98.3°F | Resp 16 | Wt 180.4 lb

## 2018-03-24 DIAGNOSIS — Z79899 Other long term (current) drug therapy: Secondary | ICD-10-CM | POA: Diagnosis not present

## 2018-03-24 DIAGNOSIS — K766 Portal hypertension: Secondary | ICD-10-CM

## 2018-03-24 DIAGNOSIS — K2270 Barrett's esophagus without dysplasia: Secondary | ICD-10-CM | POA: Diagnosis not present

## 2018-03-24 DIAGNOSIS — E785 Hyperlipidemia, unspecified: Secondary | ICD-10-CM | POA: Diagnosis not present

## 2018-03-24 DIAGNOSIS — K7460 Unspecified cirrhosis of liver: Secondary | ICD-10-CM

## 2018-03-24 DIAGNOSIS — K219 Gastro-esophageal reflux disease without esophagitis: Secondary | ICD-10-CM

## 2018-03-24 DIAGNOSIS — D696 Thrombocytopenia, unspecified: Secondary | ICD-10-CM | POA: Diagnosis not present

## 2018-03-24 DIAGNOSIS — E119 Type 2 diabetes mellitus without complications: Secondary | ICD-10-CM | POA: Insufficient documentation

## 2018-03-24 DIAGNOSIS — K3189 Other diseases of stomach and duodenum: Secondary | ICD-10-CM | POA: Diagnosis not present

## 2018-03-24 DIAGNOSIS — Z7984 Long term (current) use of oral hypoglycemic drugs: Secondary | ICD-10-CM | POA: Diagnosis not present

## 2018-03-24 DIAGNOSIS — D731 Hypersplenism: Secondary | ICD-10-CM | POA: Insufficient documentation

## 2018-03-24 DIAGNOSIS — D509 Iron deficiency anemia, unspecified: Secondary | ICD-10-CM | POA: Insufficient documentation

## 2018-03-24 DIAGNOSIS — I10 Essential (primary) hypertension: Secondary | ICD-10-CM | POA: Insufficient documentation

## 2018-03-24 DIAGNOSIS — D500 Iron deficiency anemia secondary to blood loss (chronic): Secondary | ICD-10-CM

## 2018-03-24 DIAGNOSIS — K449 Diaphragmatic hernia without obstruction or gangrene: Secondary | ICD-10-CM | POA: Insufficient documentation

## 2018-03-24 DIAGNOSIS — E538 Deficiency of other specified B group vitamins: Secondary | ICD-10-CM | POA: Diagnosis not present

## 2018-03-24 DIAGNOSIS — D508 Other iron deficiency anemias: Secondary | ICD-10-CM

## 2018-03-24 LAB — CBC WITH DIFFERENTIAL/PLATELET
BASOS ABS: 0 10*3/uL (ref 0.0–0.1)
Basophils Relative: 1 %
EOS ABS: 0.2 10*3/uL (ref 0.0–0.7)
EOS PCT: 5 %
HEMATOCRIT: 34.5 % — AB (ref 36.0–46.0)
Hemoglobin: 11.5 g/dL — ABNORMAL LOW (ref 12.0–15.0)
Lymphocytes Relative: 29 %
Lymphs Abs: 1.2 10*3/uL (ref 0.7–4.0)
MCH: 31.6 pg (ref 26.0–34.0)
MCHC: 33.3 g/dL (ref 30.0–36.0)
MCV: 94.8 fL (ref 78.0–100.0)
MONO ABS: 0.3 10*3/uL (ref 0.1–1.0)
Monocytes Relative: 8 %
NEUTROS ABS: 2.3 10*3/uL (ref 1.7–7.7)
Neutrophils Relative %: 57 %
PLATELETS: 48 10*3/uL — AB (ref 150–400)
RBC: 3.64 MIL/uL — ABNORMAL LOW (ref 3.87–5.11)
RDW: 14 % (ref 11.5–15.5)
WBC: 4 10*3/uL (ref 4.0–10.5)

## 2018-03-24 LAB — FERRITIN: Ferritin: 315 ng/mL — ABNORMAL HIGH (ref 11–307)

## 2018-03-24 LAB — IRON AND TIBC
IRON: 100 ug/dL (ref 28–170)
Saturation Ratios: 39 % — ABNORMAL HIGH (ref 10.4–31.8)
TIBC: 255 ug/dL (ref 250–450)
UIBC: 155 ug/dL

## 2018-03-24 MED ORDER — CYANOCOBALAMIN 1000 MCG/ML IJ SOLN
1000.0000 ug | Freq: Once | INTRAMUSCULAR | Status: AC
  Administered 2018-03-24: 1000 ug via INTRAMUSCULAR
  Filled 2018-03-24: qty 1

## 2018-03-24 NOTE — Patient Instructions (Signed)
[place] at [facility]  Discharge Instructions:  You received a b12 shot today. _______________________________________________________________  Thank you for choosing [place] at [facility] to provide your oncology and hematology care.  To afford each patient quality time with our providers, please arrive at least 15 minutes before your scheduled appointment.  You need to re-schedule your appointment if you arrive 10 or more minutes late.  We strive to give you quality time with our providers, and arriving late affects you and other patients whose appointments are after yours.  Also, if you no show three or more times for appointments you may be dismissed from the clinic.  Again, thank you for choosing [place] at [place] hope is that these requests will allow you access to exceptional care and in a timely manner. _______________________________________________________________  If you have questions after your visit, please contact our office at [phone] between the hours of 8:30 a.m. and 5:00 p.m. Voicemails left after 4:30 p.m. will not be returned until the following business day. _______________________________________________________________  For prescription refill requests, have your pharmacy contact our office. _______________________________________________________________  Recommendations made by the consultant and any test results will be sent to your referring physician. _______________________________________________________________

## 2018-03-24 NOTE — Progress Notes (Signed)
[place], [place] [address]   CLINIC:  Medical Oncology/Hematology  PCP:  Glenda Chroman, MD [address] [place] [address] [phone]   REASON FOR VISIT:  Follow-up for iron deficiency anemia   CURRENT THERAPY: Feraheme infusions     INTERVAL HISTORY:  Ms. Gilchrest 67 y.o. female returns for routine follow-up for iron deficiency anemia. Patient here today with her husband. Patient states her last ferahem infusion was in October. She is now more fatigued and has less energy to complete task. Otherwise she has no complaints and is doing well. She performs all her own ADLs and activities on her own. Overall, she tells me she has been feeling pretty well. Energy levels 50%; appetite 50%.     REVIEW OF SYSTEMS:  Review of Systems  Constitutional: Positive for fatigue.  All other systems reviewed and are negative.    PAST MEDICAL/SURGICAL HISTORY:  Past Medical History:  Diagnosis Date  . B12 deficiency 11/03/2016  . B12 deficiency 11/03/2016  . Barrett's esophagus   . Cirrhosis of liver without mention of alcohol    hep B surface antigen and HCV ab negative.pt has not had hepatitis A and B vaccines.U/S on 07/04/13 shows cirrhosis.  . Diverticula of colon    pancolonic  . Esophageal reflux   . Esophageal varices ([place])   . Hiatal hernia   . Iron deficiency anemia, unspecified   . Other and unspecified hyperlipidemia   . Portal hypertensive gastropathy ([place])   . Tubular adenoma   . Type II or unspecified type diabetes mellitus without mention of complication, not stated as uncontrolled   . Unspecified essential hypertension   . Unspecified hemorrhoids without mention of complication    Past Surgical History:  Procedure Laterality Date  . ABDOMINAL HYSTERECTOMY    . BIOPSY  04/30/2016   Procedure: BIOPSY;  Surgeon: Daneil Dolin, MD;  [facility];  Service: Endoscopy;;  esophagus  . CESAREAN SECTION     x 2  .  COLONOSCOPY  03/2006   left sided diverticula, splenic flexure tublar adenoma  . COLONOSCOPY  07/2002   villous tubular adenoma in rectum  . COLONOSCOPY  09/23/09   external hemorrhoids/scattered pan colonic diverticula otherwise normal. cecal lipoma bx negative, normal TI. Next TCS 09/2014  . COLONOSCOPY N/A 11/12/2014   Procedure: COLONOSCOPY;  Surgeon: Daneil Dolin, MD;  [facility];  Service: Endoscopy;  Laterality: N/A;  1215 - moved to 12:30 - G[place] to notify pt  . COLONOSCOPY N/A 01/07/2017   Procedure: COLONOSCOPY;  Surgeon: Daneil Dolin, MD;  [facility];  Service: Endoscopy;  Laterality: N/A;  3:15PM  . ESOPHAGEAL BANDING N/A 12/20/2016   Procedure: ESOPHAGEAL BANDING;  Surgeon: Danie Binder, MD;  [facility];  Service: Endoscopy;  Laterality: N/A;  . ESOPHAGEAL BANDING N/A 03/18/2017   Procedure: ESOPHAGEAL BANDING;  Surgeon: Daneil Dolin, MD;  [facility];  Service: Endoscopy;  Laterality: N/A;  . ESOPHAGEAL BANDING  12/17/2017   Procedure: ESOPHAGEAL BANDING;  Surgeon: Daneil Dolin, MD;  [facility];  Service: Endoscopy;;  . ESOPHAGOGASTRODUODENOSCOPY  08/2006   barretts esophagus, no dyplasia  . ESOPHAGOGASTRODUODENOSCOPY  03/2006   barretts esophagus,bx focal atypia c/w low grade dysplasia, SB bx negative for celiac  . ESOPHAGOGASTRODUODENOSCOPY  09/23/09   3 columns of grade 2 esophageal varices/hiatal hernia/3-4 cm segment Barrett's esophagus without dysplasia. Next EGD  09/2012  . ESOPHAGOGASTRODUODENOSCOPY N/A 11/11/2012   Dr. Gala Romney- Barrett;s esophagus, esophageal varices, portal gastopathy. hiatal hernia  . ESOPHAGOGASTRODUODENOSCOPY N/A 04/30/2016   Procedure: ESOPHAGOGASTRODUODENOSCOPY (EGD);  Surgeon: Daneil Dolin, MD;  [facility];  Service: Endoscopy;  Laterality: N/A;  815  . ESOPHAGOGASTRODUODENOSCOPY N/A 12/20/2016   Procedure: ESOPHAGOGASTRODUODENOSCOPY (EGD);  Surgeon: Danie Binder, MD;   [facility];  Service: Endoscopy;  Laterality: N/A;  . ESOPHAGOGASTRODUODENOSCOPY N/A 01/07/2017   Procedure: ESOPHAGOGASTRODUODENOSCOPY (EGD);  Surgeon: Daneil Dolin, MD;  [facility];  Service: Endoscopy;  Laterality: N/A;  . ESOPHAGOGASTRODUODENOSCOPY N/A 03/18/2017   Procedure: ESOPHAGOGASTRODUODENOSCOPY (EGD);  Surgeon: Daneil Dolin, MD;  [facility];  Service: Endoscopy;  Laterality: N/A;  1030   . ESOPHAGOGASTRODUODENOSCOPY N/A 12/17/2017   Procedure: ESOPHAGOGASTRODUODENOSCOPY (EGD);  Surgeon: Daneil Dolin, MD;  [facility];  Service: Endoscopy;  Laterality: N/A;  10:30am  . HEMORRHOID SURGERY  2008  . small bowel capsule endoscopy  10/2009   normal     SOCIAL HISTORY:  Social History   Socioeconomic History  . Marital status: Married    Spouse name: Not on file  . Number of children: Not on file  . Years of education: Not on file  . Highest education level: Not on file  Occupational History  . Not on file  Social Needs  . Financial resource strain: Not on file  . Food insecurity:    Worry: Not on file    Inability: Not on file  . Transportation needs:    Medical: Not on file    Non-medical: Not on file  Tobacco Use  . Smoking status: Never Smoker  . Smokeless tobacco: Never Used  Substance and Sexual Activity  . Alcohol use: No  . Drug use: No  . Sexual activity: Yes  Lifestyle  . Physical activity:    Days per week: Not on file    Minutes per session: Not on file  . Stress: Not on file  Relationships  . Social connections:    Talks on phone: Not on file    Gets together: Not on file    Attends religious service: Not on file    Active member of club or organization: Not on file    Attends meetings of clubs or organizations: Not on file    Relationship status: Not on file  . Intimate partner violence:    Fear of current or ex partner: Not on file    Emotionally abused: Not on file    Physically abused:  Not on file    Forced sexual activity: Not on file  Other Topics Concern  . Not on file  Social History Narrative  . Not on file    FAMILY HISTORY:  Family History  Problem Relation Age of Onset  . Alzheimer's disease Mother   . CAD Father   . Diabetes Mellitus II Father   . Alzheimer's disease Father   . Diabetes Mellitus II Sister   . Colon cancer Sister 55       small, surgical excision; chemo/rad not needed  . Cancer - Other Brother   . Diabetes Mellitus II Brother   . Hypertension Brother   . Liver disease Neg Hx     CURRENT MEDICATIONS:  Outpatient Encounter Medications as of 03/24/2018  Medication Sig  . Cholecalciferol (VITAMIN D) 2000 units tablet Take 2,000 Units by mouth daily.  . cyanocobalamin (,VITAMIN B-12,) 1000 MCG/ML injection Inject  1,000 mcg into the muscle every 30 (thirty) days.   Marland Kitchen GLIPIZIDE XL 10 MG 24 hr tablet Take 10 mg by mouth daily.   Marland Kitchen lisinopril (PRINIVIL,ZESTRIL) 10 MG tablet Take 10 mg by mouth daily.   . metFORMIN (GLUCOPHAGE) 1000 MG tablet Take 1,000 mg by mouth 2 (two) times daily with a meal.   . pantoprazole (PROTONIX) 40 MG tablet Take 1 tablet (40 mg total) by mouth 2 (two) times daily before a meal.  . propranolol (INDERAL) 10 MG tablet TAKE THREE (3) TABLETS BY MOUTH EVERY MORNING AND 3 TABLETS EVERY EVENING.  . TRESIBA FLEXTOUCH 200 UNIT/ML SOPN Inject 44 Units into the skin daily.   Marland Kitchen VICTOZA 18 MG/3ML SOLN Inject 1.8 mg into the skin daily.    No facility-administered encounter medications on file as of 03/24/2018.     ALLERGIES:  Allergies  Allergen Reactions  . Codeine Nausea And Vomiting     PHYSICAL EXAM:  ECOG Performance status: 1  Vitals:   03/24/18 1350  BP: 129/73  Pulse: 74  Resp: 16  Temp: 98.3 F (36.8 C)  SpO2: 99%   Filed Weights   03/24/18 1350  Weight: 180 lb 6.4 oz (81.8 kg)    Physical Exam   LABORATORY DATA:  I have reviewed the labs as listed.  CBC    Component Value Date/Time    WBC 4.0 03/24/2018 1259   RBC 3.64 (L) 03/24/2018 1259   HGB 11.5 (L) 03/24/2018 1259   HCT 34.5 (L) 03/24/2018 1259   PLT 48 (L) 03/24/2018 1259   MCV 94.8 03/24/2018 1259   MCH 31.6 03/24/2018 1259   MCHC 33.3 03/24/2018 1259   RDW 14.0 03/24/2018 1259   LYMPHSABS 1.2 03/24/2018 1259   MONOABS 0.3 03/24/2018 1259   EOSABS 0.2 03/24/2018 1259   BASOSABS 0.0 03/24/2018 1259   CMP Latest Ref Rng & Units 12/20/2017 11/18/2017 09/20/2017  Glucose 65 - 99 mg/dL 82 218(H) 210(H)  BUN 6 - 20 mg/dL 15 17 13   Creatinine 0.44 - 1.00 mg/dL 1.32(H) 1.37(H) 1.17(H)  Sodium 135 - 145 mmol/L 140 137 136  Potassium 3.5 - 5.1 mmol/L 4.4 5.1 4.2  Chloride 101 - 111 mmol/L 109 108 107  CO2 22 - 32 mmol/L 21(L) 20(L) 20(L)  Calcium 8.9 - 10.3 mg/dL 9.1 10.0 9.7  Total Protein 6.5 - 8.1 g/dL 6.9 7.5 7.3  Total Bilirubin 0.3 - 1.2 mg/dL 2.4(H) 2.6(H) 3.7(H)  Alkaline Phos 38 - 126 U/L 65 69 68  AST 15 - 41 U/L 31 37 41  ALT 14 - 54 U/L 22 25 28         ASSESSMENT & PLAN:   Iron deficiency anemia 1.  Normocytic anemia: This is from a combination of iron deficiency state from chronic blood loss (bleeding varices) and chronic kidney disease.  Last ferritin was 71 on 12/20/2017.  She received Feraheme infusions on 4/12 and 01/03/2018.  Hemoglobin has improved to 11.5.  Her energy levels have also slightly improved.  Ferritin and iron panel from today is pending.  Based on that will decide if she needs further parenteral iron therapy.  Otherwise she will come back in 3 months for follow-up with repeat labs.  2.  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  Platelet count is stable around 50,000.  3.  Elevated creatinine: Her creatinine has been elevated over the past few months, likely from chronic kidney disease.  She has seen Dr. Candiss Norse  of nephrology.  4.  Cirrhosis: She follows up with liver transplant program at [facility].  5.  B12 deficiency: She is receiving monthly B12 injections.  She asks [name] if she can get off of injections.  I have suggested her to take B12 1 mg tablet daily.  We will recheck B12 at next visit in 3 months.      Orders placed this encounter:  Orders Placed This Encounter  Procedures  . CBC with Differential/Platelet  . Comprehensive metabolic panel  . Ferritin  . Iron and TIBC  . Vitamin B12      Derek Jack, [place] [phone]

## 2018-03-24 NOTE — Assessment & Plan Note (Addendum)
1.  Normocytic anemia: This is from a combination of iron deficiency state from chronic blood loss (bleeding varices) and chronic kidney disease.  Last ferritin was 71 on 12/20/2017.  She received Feraheme infusions on 4/12 and 01/03/2018.  Hemoglobin has improved to 11.5.  Her energy levels have also slightly improved.  Ferritin and iron panel from today is pending.  Based on that will decide if she needs further parenteral iron therapy.  Otherwise she will come back in 3 months for follow-up with repeat labs.  2.  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  Platelet count is stable around 50,000.  3.  Elevated creatinine: Her creatinine has been elevated over the past few months, likely from chronic kidney disease.  She has seen Dr. Candiss Norse of nephrology.  4.  Cirrhosis: She follows up with liver transplant program at [facility].  5.  B12 deficiency: She is receiving monthly B12 injections.  She asks [name] if she can get off of injections.  I have suggested her to take B12 1 mg tablet daily.  We will recheck B12 at next visit in 3 months.

## 2018-03-24 NOTE — Progress Notes (Signed)
Patient tolerated b12 shot with no complaints voiced.  Injection site clean and dry with no bruising or swelling noted at site.  Band aid applied.  VSs with discharge and left ambulatory with family.  No s/s of distress noted.

## 2018-03-25 ENCOUNTER — Telehealth (HOSPITAL_COMMUNITY): Payer: Self-pay | Admitting: Nurse Practitioner

## 2018-03-28 ENCOUNTER — Telehealth: Payer: Self-pay | Admitting: *Deleted

## 2018-03-28 ENCOUNTER — Encounter: Payer: Self-pay | Admitting: *Deleted

## 2018-03-28 ENCOUNTER — Ambulatory Visit (INDEPENDENT_AMBULATORY_CARE_PROVIDER_SITE_OTHER): Payer: Medicare Other | Admitting: Nurse Practitioner

## 2018-03-28 ENCOUNTER — Encounter: Payer: Self-pay | Admitting: Nurse Practitioner

## 2018-03-28 VITALS — BP 118/71 | HR 76 | Temp 96.8°F | Ht 61.0 in | Wt 179.6 lb

## 2018-03-28 DIAGNOSIS — R932 Abnormal findings on diagnostic imaging of liver and biliary tract: Secondary | ICD-10-CM

## 2018-03-28 DIAGNOSIS — K7460 Unspecified cirrhosis of liver: Secondary | ICD-10-CM

## 2018-03-28 DIAGNOSIS — I8510 Secondary esophageal varices without bleeding: Secondary | ICD-10-CM

## 2018-03-28 NOTE — Assessment & Plan Note (Signed)
History of likely [place] cirrhosis.  Generally well compensated.  She has been seen by [facility] liver transplant and they declined to [place] at this time given a meld score of 13 when she was evaluated, however they will continue to follow her every 6 months.  She seems to be doing well.  She has had a slight bump in her meld due to mild increase in creatinine.  She is been seen by nephrology and they are comfortable with where she is at, per the patient.  She has a follow-up appointment with nephrology.  At this point I will recheck CMP and INR.  She is recently had a CBC.  AFP is up-to-date.  Follow-up in 6 months.

## 2018-03-28 NOTE — Assessment & Plan Note (Signed)
History of esophageal varices.  She is on Inderal and this is causing significant fatigue.  At the dose she is on now, her heart rate is difficult to control and currently found to be 76 today.  Given her fatigue and side effects I will not increase her Inderal dose.  She is undergoing band ligation.  Her recent EGD found grade 2 varices, 4 columns which were banded with 5 bands.  Recommended one year repeat exam.  Continue to monitor.  No hematemesis.  No melena.  Follow-up in 6 months.

## 2018-03-28 NOTE — Telephone Encounter (Signed)
[facility] website and no PA is required for MRI ABD W W/O

## 2018-03-28 NOTE — Progress Notes (Signed)
Referring Provider: Glenda Chroman, MD Primary Care Physician:  Glenda Chroman, MD Primary GI:  Dr. Gala Romney  Chief Complaint  Patient presents with  . Cirrhosis    HPI:   Alisha [name] is a 67 y.o. female who presents for post procedure follow-up and follow-up on cirrhosis.  The patient is also being seen by [facility].  The patient was last seen in our office 11/08/2017. Likely [place] cirrhosis.  Colonoscopy up-to-date 01/07/2017 with recommended 5-year repeat (2023).  Ongoing titration of Inderal for target heart rate of 55-60.  Chronically notes some fatigue, B12 is helped but no other overt hepatic symptoms.  MRI previously completed with suspicious lesions noted and recommended repeat which does not appear to have been done.  See EGD/banding history below.  [facility] evaluated her and noted meld score of 13 with controlled esophageal variceal bleeding with banding protocol and recommended not listing the patient at this time but continue six-month follow-up.  At her last visit she was doing well overall, cell transplant at [facility] and MRI unable to be completed in November due to clip still in place in the colon which was noted on abdominal XRay 05/10/2017. Overall no GI symptoms.  Recommended follow-up with [facility] for increased dose of hepatitis B vaccination, EGD, labs, abdominal x-ray for clip presents, MRI if no clip, follow-up in 6 months.  EGD completed 12/17/2017 and updated below.  Labs completed 11/18/2017 found normal AFP, persistent thrombocytopenia but stable, hemoglobin at baseline, INR normal.  Some increase in creatinine over the previous 2 months with liver enzymes normal although bilirubin a bit elevated.  Overall meld score calculated at 16, Child Pugh A.  Recent EGD/banding history: EGD 05/03/2017 for hematemesis and hematochezia, melena found 2 columns of nonbleeding grade 2 and 3 columns of grade 3 varices with stigmata of  recent bleed and red wale signs with 7 bands successfully placed, also noted moderate portal hypertensive gastropathy. Repeat endoscopy April 2018 found mucosal changes consistent with short segment Barrett's, grade 3 esophageal varices eradicated status post placement of 6 bands, portal hypertensive gastropathy. Recommended repeat EGD in 1 month.  EGD completed 03/18/2017 which found varices in the mid and distal esophagus with short columns found to be grade 3 and longer columns grade 2 with 6 bands successfully placed and incomplete eradication of varices. Moderate portal hypertensive gastropathy in the entire stomach. Recommended repeat endoscopy in 6 months.  EGD completed 12/17/2017 which found four columns of grade 2 varices without bleeding stigmata status post eradication with banding.  Appearance of Barrett's esophagus consistent with previous evaluation.  Portal hypertensive gastropathy duodenum.  Recommended repeat EGD in 1 year for surveillance.  Today she states she's doing well overall. Denies abdominal pain. Has CriticalGas.be nausea and more rare vomiting. Denies hematochezia, melena, fever, chills, unintentional weight loss, acute changes in bowel habits. Denies yellowing of skin/eyes, acute episodic confusion, tremors/shakes, darkened urine, LE edema, abdominal swelling. Still with persistent generalized fatigue, B12 still helps some; thinks her fatigue is related is related to Inderal, but it is tolerable. Denies chest pain, dyspnea, dizziness, lightheadedness, syncope, near syncope. Denies any other upper or lower GI symptoms.  She has seen nephrology and had a lot of bloodwork. She states she was told everything looks ok. Nephrologist is "Dr. Candiss Norse" and he is Dr. Florentina Addison office. She has an upcoming appointment for follow-up.   Past Medical History:  Diagnosis Date  . B12 deficiency 11/03/2016  .  B12 deficiency 11/03/2016  . Barrett's esophagus   . Cirrhosis of liver without  mention of alcohol    hep B surface antigen and HCV ab negative.pt has not had hepatitis A and B vaccines.U/S on 07/04/13 shows cirrhosis.  . Diverticula of colon    pancolonic  . Esophageal reflux   . Esophageal varices ([place])   . Hiatal hernia   . Iron deficiency anemia, unspecified   . Other and unspecified hyperlipidemia   . Portal hypertensive gastropathy ([place])   . Tubular adenoma   . Type II or unspecified type diabetes mellitus without mention of complication, not stated as uncontrolled   . Unspecified essential hypertension   . Unspecified hemorrhoids without mention of complication     Past Surgical History:  Procedure Laterality Date  . ABDOMINAL HYSTERECTOMY    . BIOPSY  04/30/2016   Procedure: BIOPSY;  Surgeon: Daneil Dolin, MD;  [facility];  Service: Endoscopy;;  esophagus  . CESAREAN SECTION     x 2  . COLONOSCOPY  03/2006   left sided diverticula, splenic flexure tublar adenoma  . COLONOSCOPY  07/2002   villous tubular adenoma in rectum  . COLONOSCOPY  09/23/09   external hemorrhoids/scattered pan colonic diverticula otherwise normal. cecal lipoma bx negative, normal TI. Next TCS 09/2014  . COLONOSCOPY N/A 11/12/2014   Procedure: COLONOSCOPY;  Surgeon: Daneil Dolin, MD;  [facility];  Service: Endoscopy;  Laterality: N/A;  1215 - moved to 12:30 - Ginger to notify pt  . COLONOSCOPY N/A 01/07/2017   Procedure: COLONOSCOPY;  Surgeon: Daneil Dolin, MD;  [facility];  Service: Endoscopy;  Laterality: N/A;  3:15PM  . ESOPHAGEAL BANDING N/A 12/20/2016   Procedure: ESOPHAGEAL BANDING;  Surgeon: Danie Binder, MD;  [facility];  Service: Endoscopy;  Laterality: N/A;  . ESOPHAGEAL BANDING N/A 03/18/2017   Procedure: ESOPHAGEAL BANDING;  Surgeon: Daneil Dolin, MD;  [facility];  Service: Endoscopy;  Laterality: N/A;  . ESOPHAGEAL BANDING  12/17/2017   Procedure: ESOPHAGEAL BANDING;  Surgeon: Daneil Dolin, MD;   [facility];  Service: Endoscopy;;  . ESOPHAGOGASTRODUODENOSCOPY  08/2006   barretts esophagus, no dyplasia  . ESOPHAGOGASTRODUODENOSCOPY  03/2006   barretts esophagus,bx focal atypia c/w low grade dysplasia, SB bx negative for celiac  . ESOPHAGOGASTRODUODENOSCOPY  09/23/09   3 columns of grade 2 esophageal varices/hiatal hernia/3-4 cm segment Barrett's esophagus without dysplasia. Next EGD 09/2012  . ESOPHAGOGASTRODUODENOSCOPY N/A 11/11/2012   Dr. Gala Romney- Barrett;s esophagus, esophageal varices, portal gastopathy. hiatal hernia  . ESOPHAGOGASTRODUODENOSCOPY N/A 04/30/2016   Procedure: ESOPHAGOGASTRODUODENOSCOPY (EGD);  Surgeon: Daneil Dolin, MD;  [facility];  Service: Endoscopy;  Laterality: N/A;  815  . ESOPHAGOGASTRODUODENOSCOPY N/A 12/20/2016   Procedure: ESOPHAGOGASTRODUODENOSCOPY (EGD);  Surgeon: Danie Binder, MD;  [facility];  Service: Endoscopy;  Laterality: N/A;  . ESOPHAGOGASTRODUODENOSCOPY N/A 01/07/2017   Procedure: ESOPHAGOGASTRODUODENOSCOPY (EGD);  Surgeon: Daneil Dolin, MD;  [facility];  Service: Endoscopy;  Laterality: N/A;  . ESOPHAGOGASTRODUODENOSCOPY N/A 03/18/2017   Procedure: ESOPHAGOGASTRODUODENOSCOPY (EGD);  Surgeon: Daneil Dolin, MD;  [facility];  Service: Endoscopy;  Laterality: N/A;  1030   . ESOPHAGOGASTRODUODENOSCOPY N/A 12/17/2017   Procedure: ESOPHAGOGASTRODUODENOSCOPY (EGD);  Surgeon: Daneil Dolin, MD;  [facility];  Service: Endoscopy;  Laterality: N/A;  10:30am  . HEMORRHOID SURGERY  2008  . small bowel capsule endoscopy  10/2009  normal    Current Outpatient Medications  Medication Sig Dispense Refill  . Cholecalciferol (VITAMIN D) 2000 units tablet Take 2,000 Units by mouth daily.    . cyanocobalamin (,VITAMIN B-12,) 1000 MCG/ML injection Inject 1,000 mcg into the muscle every 30 (thirty) days.     Marland Kitchen GLIPIZIDE XL 10 MG 24 hr tablet Take 10 mg by mouth daily.     Marland Kitchen lisinopril  (PRINIVIL,ZESTRIL) 10 MG tablet Take 10 mg by mouth daily.     . metFORMIN (GLUCOPHAGE) 1000 MG tablet Take 1,000 mg by mouth 2 (two) times daily with a meal.     . pantoprazole (PROTONIX) 40 MG tablet Take 1 tablet (40 mg total) by mouth 2 (two) times daily before a meal. 60 tablet 1  . propranolol (INDERAL) 10 MG tablet TAKE THREE (3) TABLETS BY MOUTH EVERY MORNING AND 3 TABLETS EVERY EVENING. 180 tablet 11  . TRESIBA FLEXTOUCH 200 UNIT/ML SOPN Inject 44 Units into the skin daily.   12  . VICTOZA 18 MG/3ML SOLN Inject 1.8 mg into the skin daily.      No current facility-administered medications for this visit.     Allergies as of 03/28/2018 - Review Complete 03/28/2018  Allergen Reaction Noted  . Codeine Nausea And Vomiting 04/19/2012    Family History  Problem Relation Age of Onset  . Alzheimer's disease Mother   . CAD Father   . Diabetes Mellitus II Father   . Alzheimer's disease Father   . Diabetes Mellitus II Sister   . Colon cancer Sister 43       small, surgical excision; chemo/rad not needed  . Cancer - Other Brother   . Diabetes Mellitus II Brother   . Hypertension Brother   . Liver disease Neg Hx     Social History   Socioeconomic History  . Marital status: Married    Spouse name: Not on file  . Number of children: Not on file  . Years of education: Not on file  . Highest education level: Not on file  Occupational History  . Not on file  Social Needs  . Financial resource strain: Not on file  . Food insecurity:    Worry: Not on file    Inability: Not on file  . Transportation needs:    Medical: Not on file    Non-medical: Not on file  Tobacco Use  . Smoking status: Never Smoker  . Smokeless tobacco: Never Used  Substance and Sexual Activity  . Alcohol use: No  . Drug use: No  . Sexual activity: Yes  Lifestyle  . Physical activity:    Days per week: Not on file    Minutes per session: Not on file  . Stress: Not on file  Relationships  . Social  connections:    Talks on phone: Not on file    Gets together: Not on file    Attends religious service: Not on file    Active member of club or organization: Not on file    Attends meetings of clubs or organizations: Not on file    Relationship status: Not on file  Other Topics Concern  . Not on file  Social History Narrative  . Not on file    Review of Systems: General: Negative for anorexia, weight loss, fever, chills, fatigue, weakness. ENT: Negative for hoarseness, difficulty swallowing , nasal congestion. CV: Negative for chest pain, angina, palpitations, dyspnea on exertion, peripheral edema.  Respiratory: Negative for dyspnea at rest, dyspnea on  exertion, cough, sputum, wheezing.  GI: See history of present illness. Derm: Negative for generalized pruritis.  Neuro: Negative for memory loss, confusion.  Endo: Negative for unusual weight change.  Heme: Negative for bruising or bleeding. Allergy: Negative for rash or hives.   Physical Exam: BP 118/71   Pulse 76   Temp (!) 96.8 F (36 C) (Oral)   Ht 5' 1"  (1.549 m)   Wt 179 lb 9.6 oz (81.5 kg)   BMI 33.94 kg/m  General:   Alert and oriented. Pleasant and cooperative. Well-nourished and well-developed.  Eyes:  Without icterus, sclera clear and conjunctiva pink.  Ears:  Normal auditory acuity. Cardiovascular:  S1, S2 present without murmurs appreciated. Extremities without clubbing or edema. Respiratory:  Clear to auscultation bilaterally. No wheezes, rales, or rhonchi. No distress.  Gastrointestinal:  +BS, soft, non-tender and non-distended. No HSM noted. No guarding or rebound. No masses appreciated.  Rectal:  Deferred  Musculoskalatal:  Symmetrical without gross deformities. Skin:  Intact without significant lesions or rashes. Neurologic:  Alert and oriented x4;  grossly normal neurologically. Psych:  Alert and cooperative. Normal mood and affect. Heme/Lymph/Immune: No excessive bruising noted.    03/28/2018 11:03  AM   Disclaimer: This note was dictated with voice recognition software. Similar sounding words can inadvertently be transcribed and may not be corrected upon review.

## 2018-03-28 NOTE — Assessment & Plan Note (Signed)
MRI completed in 2018 found suspicious spots and recommended 3 to 47-monthrepeat.  However, she could not have MRI at that time due to a: Clip in place.  CT did not better characterize the lesion.  Follow-up x-ray of the abdomen found interval passage of metallic clip.  She is now able to have her MRI completed.  I have encouraged her to have this done, order is still active in the system.  I will have our staff to schedule her MRI appointment for her.  Follow-up in 6 months.

## 2018-03-28 NOTE — Patient Instructions (Signed)
1. Continue taking your current medications. 2. We will help schedule your MRI for you. 3. Have your labs drawn when you are able to. 4. Follow-up in 6 months. 5. Call us if you have any questions or concerns  At [facility] we value your feedback. You may receive a survey about your visit today. Please share your experience as we strive to create trusting relationships with our patients to provide genuine, compassionate, quality care.  It was great to see you both today!  I hope you have a wonderful summer!!

## 2018-03-29 LAB — COMPREHENSIVE METABOLIC PANEL WITH GFR
AG Ratio: 1.3 (calc) (ref 1.0–2.5)
ALT: 19 U/L (ref 6–29)
AST: 25 U/L (ref 10–35)
Albumin: 3.8 g/dL (ref 3.6–5.1)
Alkaline phosphatase (APISO): 73 U/L (ref 33–130)
BUN/Creatinine Ratio: 9 (calc) (ref 6–22)
BUN: 15 mg/dL (ref 7–25)
CO2: 20 mmol/L (ref 20–32)
Calcium: 9.2 mg/dL (ref 8.6–10.4)
Chloride: 107 mmol/L (ref 98–110)
Creat: 1.62 mg/dL — ABNORMAL HIGH (ref 0.50–0.99)
Globulin: 2.9 g/dL (ref 1.9–3.7)
Glucose, Bld: 233 mg/dL — ABNORMAL HIGH (ref 65–139)
Potassium: 4.7 mmol/L (ref 3.5–5.3)
Sodium: 137 mmol/L (ref 135–146)
Total Bilirubin: 2.3 mg/dL — ABNORMAL HIGH (ref 0.2–1.2)
Total Protein: 6.7 g/dL (ref 6.1–8.1)

## 2018-03-29 LAB — PROTIME-INR
INR: 1.2 — ABNORMAL HIGH
Prothrombin Time: 12.2 s — ABNORMAL HIGH (ref 9.0–11.5)

## 2018-03-29 NOTE — Progress Notes (Signed)
cc'ed to pcp °

## 2018-04-04 ENCOUNTER — Ambulatory Visit (HOSPITAL_COMMUNITY)
Admission: RE | Admit: 2018-04-04 | Discharge: 2018-04-04 | Disposition: A | Discharge status: Home / Self Care | Payer: Medicare Other | Source: Ambulatory Visit | Attending: Nurse Practitioner | Admitting: Nurse Practitioner

## 2018-04-04 DIAGNOSIS — K8020 Calculus of gallbladder without cholecystitis without obstruction: Secondary | ICD-10-CM | POA: Diagnosis not present

## 2018-04-04 DIAGNOSIS — K7460 Unspecified cirrhosis of liver: Secondary | ICD-10-CM | POA: Insufficient documentation

## 2018-04-04 DIAGNOSIS — R161 Splenomegaly, not elsewhere classified: Secondary | ICD-10-CM | POA: Insufficient documentation

## 2018-04-04 DIAGNOSIS — K862 Cyst of pancreas: Secondary | ICD-10-CM | POA: Diagnosis not present

## 2018-04-04 DIAGNOSIS — K769 Liver disease, unspecified: Secondary | ICD-10-CM

## 2018-04-04 DIAGNOSIS — K760 Fatty (change of) liver, not elsewhere classified: Secondary | ICD-10-CM

## 2018-04-04 DIAGNOSIS — I8500 Esophageal varices without bleeding: Secondary | ICD-10-CM | POA: Diagnosis not present

## 2018-04-04 MED ORDER — GADOBENATE DIMEGLUMINE 529 MG/ML IV SOLN
7.0000 mL | Freq: Once | INTRAVENOUS | Status: AC | PRN
  Administered 2018-04-04: 7 mL via INTRAVENOUS

## 2018-04-25 ENCOUNTER — Other Ambulatory Visit: Payer: Self-pay

## 2018-04-25 ENCOUNTER — Other Ambulatory Visit (HOSPITAL_COMMUNITY): Payer: Self-pay | Admitting: Nurse Practitioner

## 2018-04-25 ENCOUNTER — Encounter (HOSPITAL_COMMUNITY): Payer: Self-pay

## 2018-04-25 ENCOUNTER — Inpatient Hospital Stay (HOSPITAL_COMMUNITY): Payer: Medicare Other | Attending: Oncology

## 2018-04-25 DIAGNOSIS — Z79899 Other long term (current) drug therapy: Secondary | ICD-10-CM | POA: Insufficient documentation

## 2018-04-25 DIAGNOSIS — E538 Deficiency of other specified B group vitamins: Secondary | ICD-10-CM | POA: Diagnosis not present

## 2018-04-25 MED ORDER — CYANOCOBALAMIN 1000 MCG/ML IJ SOLN
1000.0000 ug | Freq: Once | INTRAMUSCULAR | Status: AC
  Administered 2018-04-25: 1000 ug via INTRAMUSCULAR

## 2018-04-25 MED ORDER — CYANOCOBALAMIN 1000 MCG/ML IJ SOLN
INTRAMUSCULAR | Status: AC
  Filled 2018-04-25: qty 1

## 2018-04-25 NOTE — Progress Notes (Signed)
B12 injection given per orders. Patient tolerated it well without problems. Vitals stable and discharged home from clinic ambulatory. Follow up as scheduled.

## 2018-04-25 NOTE — Patient Instructions (Signed)
[place] at [facility] Discharge Instructions  b12 injection given today.  Follow up as scheduled.   Thank you for choosing [place] at [facility] to provide your oncology and hematology care.  To afford each patient quality time with our provider, please arrive at least 15 minutes before your scheduled appointment time.   If you have a lab appointment with the [place] please come in thru the  Main Entrance and check in at the main information desk  You need to re-schedule your appointment should you arrive 10 or more minutes late.  We strive to give you quality time with our providers, and arriving late affects you and other patients whose appointments are after yours.  Also, if you no show three or more times for appointments you may be dismissed from the clinic at the providers discretion.     Again, thank you for choosing [facility].  Our hope is that these requests will decrease the amount of time that you wait before being seen by our physicians.       _____________________________________________________________  Should you have questions after your visit to [facility], please contact our office at [phone] between the hours of 8:00 a.m. and 4:30 p.m.  Voicemails left after 4:00 p.m. will not be returned until the following business day.  For prescription refill requests, have your pharmacy contact our office and allow 72 hours.    Cancer Center Support Programs:   > Cancer Support Group  2nd Tuesday of the month 1pm-2pm, Journey Room

## 2018-05-10 ENCOUNTER — Ambulatory Visit: Payer: Medicare Other | Admitting: Nurse Practitioner

## 2018-05-26 ENCOUNTER — Inpatient Hospital Stay (HOSPITAL_COMMUNITY): Payer: Medicare Other | Attending: Oncology

## 2018-05-26 ENCOUNTER — Other Ambulatory Visit: Payer: Self-pay

## 2018-05-26 VITALS — BP 133/59 | HR 73 | Temp 97.8°F | Resp 18

## 2018-05-26 DIAGNOSIS — Z79899 Other long term (current) drug therapy: Secondary | ICD-10-CM | POA: Insufficient documentation

## 2018-05-26 DIAGNOSIS — E538 Deficiency of other specified B group vitamins: Secondary | ICD-10-CM

## 2018-05-26 MED ORDER — CYANOCOBALAMIN 1000 MCG/ML IJ SOLN
1000.0000 ug | Freq: Once | INTRAMUSCULAR | Status: AC
  Administered 2018-05-26: 1000 ug via INTRAMUSCULAR

## 2018-05-26 MED ORDER — CYANOCOBALAMIN 1000 MCG/ML IJ SOLN
INTRAMUSCULAR | Status: AC
  Filled 2018-05-26: qty 1

## 2018-05-30 NOTE — Progress Notes (Signed)
B12 injection given today.   Patient tolerated it well without problems. Vitals stable and discharged home from clinic ambulatory. Follow up as scheduled.

## 2018-05-30 NOTE — Patient Instructions (Signed)
[place] at [facility] Discharge Instructions  B12 injection given today. Follow up as scheduled   Thank you for choosing [place] at [facility] to provide your oncology and hematology care.  To afford each patient quality time with our provider, please arrive at least 15 minutes before your scheduled appointment time.   If you have a lab appointment with the [place] please come in thru the  Main Entrance and check in at the main information desk  You need to re-schedule your appointment should you arrive 10 or more minutes late.  We strive to give you quality time with our providers, and arriving late affects you and other patients whose appointments are after yours.  Also, if you no show three or more times for appointments you may be dismissed from the clinic at the providers discretion.     Again, thank you for choosing [facility].  Our hope is that these requests will decrease the amount of time that you wait before being seen by our physicians.       _____________________________________________________________  Should you have questions after your visit to [place], please contact our office at [phone] between the hours of 8:00 a.m. and 4:30 p.m.  Voicemails left after 4:00 p.m. will not be returned until the following business day.  For prescription refill requests, have your pharmacy contact our office and allow 72 hours.    Cancer Center Support Programs:   > Cancer Support Group  2nd Tuesday of the month 1pm-2pm, Journey Room

## 2018-06-20 ENCOUNTER — Inpatient Hospital Stay (HOSPITAL_COMMUNITY): Payer: Medicare Other | Attending: Oncology

## 2018-06-20 DIAGNOSIS — E785 Hyperlipidemia, unspecified: Secondary | ICD-10-CM | POA: Diagnosis not present

## 2018-06-20 DIAGNOSIS — Z7984 Long term (current) use of oral hypoglycemic drugs: Secondary | ICD-10-CM | POA: Insufficient documentation

## 2018-06-20 DIAGNOSIS — E538 Deficiency of other specified B group vitamins: Secondary | ICD-10-CM | POA: Diagnosis not present

## 2018-06-20 DIAGNOSIS — Z79899 Other long term (current) drug therapy: Secondary | ICD-10-CM | POA: Diagnosis not present

## 2018-06-20 DIAGNOSIS — K7460 Unspecified cirrhosis of liver: Secondary | ICD-10-CM | POA: Insufficient documentation

## 2018-06-20 DIAGNOSIS — K2270 Barrett's esophagus without dysplasia: Secondary | ICD-10-CM | POA: Diagnosis not present

## 2018-06-20 DIAGNOSIS — R944 Abnormal results of kidney function studies: Secondary | ICD-10-CM | POA: Insufficient documentation

## 2018-06-20 DIAGNOSIS — I10 Essential (primary) hypertension: Secondary | ICD-10-CM | POA: Diagnosis not present

## 2018-06-20 DIAGNOSIS — Z800 Family history of malignant neoplasm of digestive organs: Secondary | ICD-10-CM | POA: Diagnosis not present

## 2018-06-20 DIAGNOSIS — D509 Iron deficiency anemia, unspecified: Secondary | ICD-10-CM | POA: Diagnosis present

## 2018-06-20 DIAGNOSIS — K766 Portal hypertension: Secondary | ICD-10-CM | POA: Insufficient documentation

## 2018-06-20 DIAGNOSIS — K449 Diaphragmatic hernia without obstruction or gangrene: Secondary | ICD-10-CM | POA: Insufficient documentation

## 2018-06-20 DIAGNOSIS — E119 Type 2 diabetes mellitus without complications: Secondary | ICD-10-CM | POA: Diagnosis not present

## 2018-06-20 DIAGNOSIS — K219 Gastro-esophageal reflux disease without esophagitis: Secondary | ICD-10-CM | POA: Diagnosis not present

## 2018-06-20 DIAGNOSIS — D508 Other iron deficiency anemias: Secondary | ICD-10-CM

## 2018-06-20 DIAGNOSIS — D696 Thrombocytopenia, unspecified: Secondary | ICD-10-CM | POA: Insufficient documentation

## 2018-06-20 LAB — COMPREHENSIVE METABOLIC PANEL
ALT: 26 U/L (ref 0–44)
AST: 26 U/L (ref 15–41)
Albumin: 3.6 g/dL (ref 3.5–5.0)
Alkaline Phosphatase: 63 U/L (ref 38–126)
Anion gap: 7 (ref 5–15)
BUN: 18 mg/dL (ref 8–23)
CHLORIDE: 109 mmol/L (ref 98–111)
CO2: 23 mmol/L (ref 22–32)
CREATININE: 1.89 mg/dL — AB (ref 0.44–1.00)
Calcium: 9.2 mg/dL (ref 8.9–10.3)
GFR calc non Af Amer: 26 mL/min — ABNORMAL LOW (ref >60)
GFR, EST AFRICAN AMERICAN: 31 mL/min — AB (ref >60)
Glucose, Bld: 227 mg/dL — ABNORMAL HIGH (ref 70–99)
Potassium: 5.3 mmol/L — ABNORMAL HIGH (ref 3.5–5.1)
SODIUM: 139 mmol/L (ref 135–145)
Total Bilirubin: 2.9 mg/dL — ABNORMAL HIGH (ref 0.3–1.2)
Total Protein: 6.5 g/dL (ref 6.5–8.1)

## 2018-06-20 LAB — CBC WITH DIFFERENTIAL/PLATELET
BASOS PCT: 0 %
Basophils Absolute: 0 10*3/uL (ref 0.0–0.1)
EOS ABS: 0.2 10*3/uL (ref 0.0–0.7)
EOS PCT: 4 %
HCT: 31.7 % — ABNORMAL LOW (ref 36.0–46.0)
Hemoglobin: 10.3 g/dL — ABNORMAL LOW (ref 12.0–15.0)
Lymphocytes Relative: 26 %
Lymphs Abs: 1 10*3/uL (ref 0.7–4.0)
MCH: 31 pg (ref 26.0–34.0)
MCHC: 32.5 g/dL (ref 30.0–36.0)
MCV: 95.5 fL (ref 78.0–100.0)
Monocytes Absolute: 0.3 10*3/uL (ref 0.1–1.0)
Monocytes Relative: 9 %
Neutro Abs: 2.2 10*3/uL (ref 1.7–7.7)
Neutrophils Relative %: 61 %
PLATELETS: 45 10*3/uL — AB (ref 150–400)
RBC: 3.32 MIL/uL — AB (ref 3.87–5.11)
RDW: 13.8 % (ref 11.5–15.5)
WBC: 3.6 10*3/uL — AB (ref 4.0–10.5)

## 2018-06-20 LAB — IRON AND TIBC
IRON: 86 ug/dL (ref 28–170)
Saturation Ratios: 36 % — ABNORMAL HIGH (ref 10.4–31.8)
TIBC: 238 ug/dL — ABNORMAL LOW (ref 250–450)
UIBC: 152 ug/dL

## 2018-06-20 LAB — VITAMIN B12: VITAMIN B 12: 688 pg/mL (ref 180–914)

## 2018-06-20 LAB — FERRITIN: Ferritin: 273 ng/mL (ref 11–307)

## 2018-06-27 ENCOUNTER — Encounter (HOSPITAL_COMMUNITY): Payer: Self-pay | Admitting: Hematology

## 2018-06-27 ENCOUNTER — Inpatient Hospital Stay (HOSPITAL_BASED_OUTPATIENT_CLINIC_OR_DEPARTMENT_OTHER): Payer: Medicare Other | Admitting: Hematology

## 2018-06-27 ENCOUNTER — Inpatient Hospital Stay (HOSPITAL_COMMUNITY): Payer: Medicare Other

## 2018-06-27 ENCOUNTER — Other Ambulatory Visit: Payer: Self-pay

## 2018-06-27 VITALS — BP 118/49 | HR 72 | Temp 97.5°F | Resp 18 | Wt 180.1 lb

## 2018-06-27 DIAGNOSIS — Z79899 Other long term (current) drug therapy: Secondary | ICD-10-CM

## 2018-06-27 DIAGNOSIS — E538 Deficiency of other specified B group vitamins: Secondary | ICD-10-CM | POA: Diagnosis not present

## 2018-06-27 DIAGNOSIS — E119 Type 2 diabetes mellitus without complications: Secondary | ICD-10-CM

## 2018-06-27 DIAGNOSIS — D696 Thrombocytopenia, unspecified: Secondary | ICD-10-CM | POA: Diagnosis not present

## 2018-06-27 DIAGNOSIS — D509 Iron deficiency anemia, unspecified: Secondary | ICD-10-CM

## 2018-06-27 DIAGNOSIS — K449 Diaphragmatic hernia without obstruction or gangrene: Secondary | ICD-10-CM

## 2018-06-27 DIAGNOSIS — D500 Iron deficiency anemia secondary to blood loss (chronic): Secondary | ICD-10-CM

## 2018-06-27 DIAGNOSIS — I10 Essential (primary) hypertension: Secondary | ICD-10-CM

## 2018-06-27 DIAGNOSIS — K766 Portal hypertension: Secondary | ICD-10-CM

## 2018-06-27 DIAGNOSIS — K7460 Unspecified cirrhosis of liver: Secondary | ICD-10-CM | POA: Diagnosis not present

## 2018-06-27 DIAGNOSIS — K219 Gastro-esophageal reflux disease without esophagitis: Secondary | ICD-10-CM

## 2018-06-27 DIAGNOSIS — Z800 Family history of malignant neoplasm of digestive organs: Secondary | ICD-10-CM

## 2018-06-27 DIAGNOSIS — K2270 Barrett's esophagus without dysplasia: Secondary | ICD-10-CM

## 2018-06-27 DIAGNOSIS — Z7984 Long term (current) use of oral hypoglycemic drugs: Secondary | ICD-10-CM

## 2018-06-27 DIAGNOSIS — R944 Abnormal results of kidney function studies: Secondary | ICD-10-CM

## 2018-06-27 DIAGNOSIS — E785 Hyperlipidemia, unspecified: Secondary | ICD-10-CM

## 2018-06-27 MED ORDER — CYANOCOBALAMIN 1000 MCG/ML IJ SOLN
INTRAMUSCULAR | Status: AC
  Filled 2018-06-27: qty 1

## 2018-06-27 MED ORDER — CYANOCOBALAMIN 1000 MCG/ML IJ SOLN
1000.0000 ug | Freq: Once | INTRAMUSCULAR | Status: AC
  Administered 2018-06-27: 1000 ug via INTRAMUSCULAR

## 2018-06-27 NOTE — Patient Instructions (Signed)
[place] at [facility] Discharge Instructions  Follow up in 3 months with labs prior to your visit.    Thank you for choosing [place] at [facility] to provide your oncology and hematology care.  To afford each patient quality time with our provider, please arrive at least 15 minutes before your scheduled appointment time.   If you have a lab appointment with the [place] please come in thru the  Main Entrance and check in at the main information desk  You need to re-schedule your appointment should you arrive 10 or more minutes late.  We strive to give you quality time with our providers, and arriving late affects you and other patients whose appointments are after yours.  Also, if you no show three or more times for appointments you may be dismissed from the clinic at the providers discretion.     Again, thank you for choosing [facility].  Our hope is that these requests will decrease the amount of time that you wait before being seen by our physicians.       _____________________________________________________________  Should you have questions after your visit to [facility], please contact our office at [phone] between the hours of 8:00 a.m. and 4:30 p.m.  Voicemails left after 4:00 p.m. will not be returned until the following business day.  For prescription refill requests, have your pharmacy contact our office and allow 72 hours.    Cancer Center Support Programs:   > Cancer Support Group  2nd Tuesday of the month 1pm-2pm, Journey Room

## 2018-06-27 NOTE — Assessment & Plan Note (Signed)
1.  Normocytic anemia: - This is from combination of IDA, chronic blood loss from bleeding versus and CKD.   -last Feraheme infusion was on 12/24/2017 and 01/03/2018. -I reviewed the blood counts today.  Hemoglobin is 10.3.  Ferritin was 273 and percent saturation was 36.  B12 was 688.  2.  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  Platelet count is stable around 45.  No excessive bleeding.  3.  Elevated creatinine: Her creatinine has been elevated over the past few months, likely from chronic kidney disease.  She has seen Dr. Candiss Norse of nephrology.  4.  Cirrhosis: She follows up with liver transplant program at [facility]. -An MRI of the abdomen with and without contrast on 04/04/2018 shows cirrhosis with no liver mass.  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  5.  B12 deficiency: B12 was 688.  She will continue B12 1 mg tablet daily.

## 2018-06-27 NOTE — Progress Notes (Signed)
Alisha [name], [place] [address]   CLINIC:  Medical Oncology/Hematology  PCP:  Glenda Chroman, MD [place] [phone]   REASON FOR VISIT: Follow-up for iron deficiency anemia  CURRENT THERAPY: intermittent feraheme infusions.    INTERVAL HISTORY:  Alisha [name] 67 y.o. female returns for routine follow-up for iron deficiency anemia. Patient is here today and has complaints of fatigue. She has an appetite and energy level of 50%. She denies any new pains. Denies any easy bruising or bleeding. Denies any headaches or SOB. Patient lives at home and completes all her ADLs and activities. She reports her last feraheme infusion in April.     REVIEW OF SYSTEMS:  Review of Systems  Constitutional: Positive for fatigue.  All other systems reviewed and are negative.    PAST MEDICAL/SURGICAL HISTORY:  Past Medical History:  Diagnosis Date  . B12 deficiency 11/03/2016  . B12 deficiency 11/03/2016  . Barrett's esophagus   . Cirrhosis of liver without mention of alcohol    hep B surface antigen and HCV ab negative.pt has not had hepatitis A and B vaccines.U/S on 07/04/13 shows cirrhosis.  . Diverticula of colon    pancolonic  . Esophageal reflux   . Esophageal varices ([place])   . Hiatal hernia   . Iron deficiency anemia, unspecified   . Other and unspecified hyperlipidemia   . Portal hypertensive gastropathy ([place])   . Tubular adenoma   . Type II or unspecified type diabetes mellitus without mention of complication, not stated as uncontrolled   . Unspecified essential hypertension   . Unspecified hemorrhoids without mention of complication    Past Surgical History:  Procedure Laterality Date  . ABDOMINAL HYSTERECTOMY    . BIOPSY  04/30/2016   Procedure: BIOPSY;  Surgeon: Daneil Dolin, MD;  [facility];  Service: Endoscopy;;  esophagus  . CESAREAN SECTION     x 2  . COLONOSCOPY  03/2006   left sided diverticula,  splenic flexure tublar adenoma  . COLONOSCOPY  07/2002   villous tubular adenoma in rectum  . COLONOSCOPY  09/23/09   external hemorrhoids/scattered pan colonic diverticula otherwise normal. cecal lipoma bx negative, normal TI. Next TCS 09/2014  . COLONOSCOPY N/A 11/12/2014   Procedure: COLONOSCOPY;  Surgeon: Daneil Dolin, MD;  [facility];  Service: Endoscopy;  Laterality: N/A;  1215 - moved to 12:30 - Ginger to notify pt  . COLONOSCOPY N/A 01/07/2017   Procedure: COLONOSCOPY;  Surgeon: Daneil Dolin, MD;  [facility];  Service: Endoscopy;  Laterality: N/A;  3:15PM  . ESOPHAGEAL BANDING N/A 12/20/2016   Procedure: ESOPHAGEAL BANDING;  Surgeon: Danie Binder, MD;  [facility];  Service: Endoscopy;  Laterality: N/A;  . ESOPHAGEAL BANDING N/A 03/18/2017   Procedure: ESOPHAGEAL BANDING;  Surgeon: Daneil Dolin, MD;  [facility];  Service: Endoscopy;  Laterality: N/A;  . ESOPHAGEAL BANDING  12/17/2017   Procedure: ESOPHAGEAL BANDING;  Surgeon: Daneil Dolin, MD;  [facility];  Service: Endoscopy;;  . ESOPHAGOGASTRODUODENOSCOPY  08/2006   barretts esophagus, no dyplasia  . ESOPHAGOGASTRODUODENOSCOPY  03/2006   barretts esophagus,bx focal atypia c/w low grade dysplasia, SB bx negative for celiac  . ESOPHAGOGASTRODUODENOSCOPY  09/23/09   3 columns of grade 2 esophageal varices/hiatal hernia/3-4 cm segment Barrett's esophagus without dysplasia. Next EGD 09/2012  . ESOPHAGOGASTRODUODENOSCOPY N/A 11/11/2012   Dr. Gala Romney- Barrett;s  esophagus, esophageal varices, portal gastopathy. hiatal hernia  . ESOPHAGOGASTRODUODENOSCOPY N/A 04/30/2016   Procedure: ESOPHAGOGASTRODUODENOSCOPY (EGD);  Surgeon: Daneil Dolin, MD;  [facility];  Service: Endoscopy;  Laterality: N/A;  815  . ESOPHAGOGASTRODUODENOSCOPY N/A 12/20/2016   Procedure: ESOPHAGOGASTRODUODENOSCOPY (EGD);  Surgeon: Danie Binder, MD;  [facility];  Service: Endoscopy;   Laterality: N/A;  . ESOPHAGOGASTRODUODENOSCOPY N/A 01/07/2017   Procedure: ESOPHAGOGASTRODUODENOSCOPY (EGD);  Surgeon: Daneil Dolin, MD;  [facility];  Service: Endoscopy;  Laterality: N/A;  . ESOPHAGOGASTRODUODENOSCOPY N/A 03/18/2017   Procedure: ESOPHAGOGASTRODUODENOSCOPY (EGD);  Surgeon: Daneil Dolin, MD;  [facility];  Service: Endoscopy;  Laterality: N/A;  1030   . ESOPHAGOGASTRODUODENOSCOPY N/A 12/17/2017   Procedure: ESOPHAGOGASTRODUODENOSCOPY (EGD);  Surgeon: Daneil Dolin, MD;  [facility];  Service: Endoscopy;  Laterality: N/A;  10:30am  . HEMORRHOID SURGERY  2008  . small bowel capsule endoscopy  10/2009   normal     SOCIAL HISTORY:  Social History   Socioeconomic History  . Marital status: Married    Spouse name: Not on file  . Number of children: Not on file  . Years of education: Not on file  . Highest education level: Not on file  Occupational History  . Not on file  Social Needs  . Financial resource strain: Not on file  . Food insecurity:    Worry: Not on file    Inability: Not on file  . Transportation needs:    Medical: Not on file    Non-medical: Not on file  Tobacco Use  . Smoking status: Never Smoker  . Smokeless tobacco: Never Used  Substance and Sexual Activity  . Alcohol use: No  . Drug use: No  . Sexual activity: Yes  Lifestyle  . Physical activity:    Days per week: Not on file    Minutes per session: Not on file  . Stress: Not on file  Relationships  . Social connections:    Talks on phone: Not on file    Gets together: Not on file    Attends religious service: Not on file    Active member of club or organization: Not on file    Attends meetings of clubs or organizations: Not on file    Relationship status: Not on file  . Intimate partner violence:    Fear of current or ex partner: Not on file    Emotionally abused: Not on file    Physically abused: Not on file    Forced sexual activity: Not on  file  Other Topics Concern  . Not on file  Social History Narrative  . Not on file    FAMILY HISTORY:  Family History  Problem Relation Age of Onset  . Alzheimer's disease Mother   . CAD Father   . Diabetes Mellitus II Father   . Alzheimer's disease Father   . Diabetes Mellitus II Sister   . Colon cancer Sister 79       small, surgical excision; chemo/rad not needed  . Cancer - Other Brother   . Diabetes Mellitus II Brother   . Hypertension Brother   . Liver disease Neg Hx     CURRENT MEDICATIONS:  Outpatient Encounter Medications as of 06/27/2018  Medication Sig  . Cholecalciferol (VITAMIN D) 2000 units tablet Take 2,000 Units by mouth daily.  . cyanocobalamin (,VITAMIN B-12,) 1000 MCG/ML injection Inject 1,000 mcg into the muscle every 30 (thirty) days.   Marland Kitchen  GLIPIZIDE XL 10 MG 24 hr tablet Take 10 mg by mouth daily.   Marland Kitchen lisinopril (PRINIVIL,ZESTRIL) 10 MG tablet Take 10 mg by mouth daily.   . metFORMIN (GLUCOPHAGE) 1000 MG tablet Take 1,000 mg by mouth 2 (two) times daily with a meal.   . pantoprazole (PROTONIX) 40 MG tablet Take 1 tablet (40 mg total) by mouth 2 (two) times daily before a meal.  . propranolol (INDERAL) 10 MG tablet TAKE THREE (3) TABLETS BY MOUTH EVERY MORNING AND 3 TABLETS EVERY EVENING.  . TRESIBA FLEXTOUCH 200 UNIT/ML SOPN Inject 44 Units into the skin daily.   Marland Kitchen VICTOZA 18 MG/3ML SOLN Inject 1.8 mg into the skin daily.    No facility-administered encounter medications on file as of 06/27/2018.     ALLERGIES:  Allergies  Allergen Reactions  . Codeine Nausea And Vomiting     PHYSICAL EXAM:  ECOG Performance status: 1  Vitals:   06/27/18 1130  BP: (!) 118/49  Pulse: 72  Resp: 18  Temp: (!) 97.5 F (36.4 C)  SpO2: 98%   Filed Weights   06/27/18 1130  Weight: 180 lb 1.6 oz (81.7 kg)    Physical Exam  Constitutional: She is oriented to person, place, and time. She appears well-developed and well-nourished.  Musculoskeletal: Normal  range of motion.  Neurological: She is alert and oriented to person, place, and time.  Skin: Skin is warm and dry.  Psychiatric: She has a normal mood and affect. Her behavior is normal. Judgment and thought content normal.  Abdomen: Soft nontender with no palpable abnormal.   LABORATORY DATA:  I have reviewed the labs as listed.  CBC    Component Value Date/Time   WBC 3.6 (L) 06/20/2018 1236   RBC 3.32 (L) 06/20/2018 1236   HGB 10.3 (L) 06/20/2018 1236   HCT 31.7 (L) 06/20/2018 1236   PLT 45 (L) 06/20/2018 1236   MCV 95.5 06/20/2018 1236   MCH 31.0 06/20/2018 1236   MCHC 32.5 06/20/2018 1236   RDW 13.8 06/20/2018 1236   LYMPHSABS 1.0 06/20/2018 1236   MONOABS 0.3 06/20/2018 1236   EOSABS 0.2 06/20/2018 1236   BASOSABS 0.0 06/20/2018 1236   CMP Latest Ref Rng & Units 06/20/2018 03/28/2018 12/20/2017  Glucose 70 - 99 mg/dL 227(H) 233(H) 82  BUN 8 - 23 mg/dL 18 15 15   Creatinine 0.44 - 1.00 mg/dL 1.89(H) 1.62(H) 1.32(H)  Sodium 135 - 145 mmol/L 139 137 140  Potassium 3.5 - 5.1 mmol/L 5.3(H) 4.7 4.4  Chloride 98 - 111 mmol/L 109 107 109  CO2 22 - 32 mmol/L 23 20 21(L)  Calcium 8.9 - 10.3 mg/dL 9.2 9.2 9.1  Total Protein 6.5 - 8.1 g/dL 6.5 6.7 6.9  Total Bilirubin 0.3 - 1.2 mg/dL 2.9(H) 2.3(H) 2.4(H)  Alkaline Phos 38 - 126 U/L 63 - 65  AST 15 - 41 U/L 26 25 31   ALT 0 - 44 U/L 26 19 22        DIAGNOSTIC IMAGING:  I have independently reviewed the MRI of the abdomen dated 04/02/2018 and discussed with patient.     ASSESSMENT & PLAN:   Iron deficiency anemia 1.  Normocytic anemia: - This is from combination of IDA, chronic blood loss from bleeding versus and CKD.   -last Feraheme infusion was on 12/24/2017 and 01/03/2018. -I reviewed the blood counts today.  Hemoglobin is 10.3.  Ferritin was 273 and percent saturation was 36.  B12 was 688.  2.  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  Platelet count is stable around 45.  No excessive  bleeding.  3.  Elevated creatinine: Her creatinine has been elevated over the past few months, likely from chronic kidney disease.  She has seen Dr. Candiss Norse of nephrology.  4.  Cirrhosis: She follows up with liver transplant program [facility]. -An MRI of the abdomen with and without contrast on 04/04/2018 shows cirrhosis with no liver mass.  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  5.  B12 deficiency: B12 was 688.  She will continue B12 1 mg tablet daily.      Orders placed this encounter:  Orders Placed This Encounter  Procedures  . CBC with Differential/Platelet  . Comprehensive metabolic panel  . Ferritin  . Iron and TIBC  . Vitamin B12  . Folate      Derek Jack, MD [place] [phone]

## 2018-06-27 NOTE — Progress Notes (Signed)
Alisha [name] presents today for injection per the provider's orders.  B12 administration without incident; see MAR for injection details.  Patient tolerated procedure well and without incident.  No questions or complaints noted at this time.  Discharged ambulatory.

## 2018-06-28 NOTE — Telephone Encounter (Signed)
w

## 2018-07-28 ENCOUNTER — Ambulatory Visit (HOSPITAL_COMMUNITY): Payer: Medicare Other

## 2018-08-01 ENCOUNTER — Inpatient Hospital Stay (HOSPITAL_COMMUNITY): Payer: Medicare Other | Attending: Oncology

## 2018-08-01 VITALS — BP 128/48 | HR 74 | Temp 97.8°F | Resp 18

## 2018-08-01 DIAGNOSIS — Z79899 Other long term (current) drug therapy: Secondary | ICD-10-CM | POA: Diagnosis not present

## 2018-08-01 DIAGNOSIS — E538 Deficiency of other specified B group vitamins: Secondary | ICD-10-CM | POA: Insufficient documentation

## 2018-08-01 DIAGNOSIS — D509 Iron deficiency anemia, unspecified: Secondary | ICD-10-CM | POA: Insufficient documentation

## 2018-08-01 MED ORDER — CYANOCOBALAMIN 1000 MCG/ML IJ SOLN
1000.0000 ug | Freq: Once | INTRAMUSCULAR | Status: AC
  Administered 2018-08-01: 1000 ug via INTRAMUSCULAR

## 2018-08-01 NOTE — Progress Notes (Signed)
Alisha [name] presents today for B12 injection. Tolerated injection without incident or complaint. VSS. Pt discharged self ambulatory in satisfactory condition.

## 2018-08-01 NOTE — Patient Instructions (Signed)
[facility] _______________________________________________________________  Thank you for choosing [facility] at [facility] to provide your oncology and hematology care.  To afford each patient quality time with our providers, please arrive at least 15 minutes before your scheduled appointment.  You need to re-schedule your appointment if you arrive 10 or more minutes late.  We strive to give you quality time with our providers, and arriving late affects you and other patients whose appointments are after yours.  Also, if you no show three or more times for appointments you may be dismissed from the clinic.  Again, thank you for choosing [facility] at [facility]. Our hope is that these requests will allow you access to exceptional care and in a timely manner. _______________________________________________________________  If you have questions after your visit, please contact our office at [phone] between the hours of 8:30 a.m. and 5:00 p.m. Voicemails left after 4:30 p.m. will not be returned until the following business day. _______________________________________________________________  For prescription refill requests, have your pharmacy contact our office. _______________________________________________________________  Recommendations made by the consultant and any test results will be sent to your referring physician. _______________________________________________________________ 

## 2018-08-29 ENCOUNTER — Ambulatory Visit (HOSPITAL_COMMUNITY): Payer: Medicare Other

## 2018-08-31 ENCOUNTER — Inpatient Hospital Stay (HOSPITAL_COMMUNITY): Payer: Medicare Other | Attending: Oncology

## 2018-08-31 ENCOUNTER — Encounter (HOSPITAL_COMMUNITY): Payer: Self-pay

## 2018-08-31 VITALS — BP 139/58 | HR 66 | Temp 98.2°F | Resp 18

## 2018-08-31 DIAGNOSIS — D509 Iron deficiency anemia, unspecified: Secondary | ICD-10-CM | POA: Diagnosis not present

## 2018-08-31 DIAGNOSIS — Z79899 Other long term (current) drug therapy: Secondary | ICD-10-CM | POA: Diagnosis not present

## 2018-08-31 DIAGNOSIS — E538 Deficiency of other specified B group vitamins: Secondary | ICD-10-CM | POA: Diagnosis not present

## 2018-08-31 MED ORDER — CYANOCOBALAMIN 1000 MCG/ML IJ SOLN
1000.0000 ug | Freq: Once | INTRAMUSCULAR | Status: AC
  Administered 2018-08-31: 1000 ug via INTRAMUSCULAR
  Filled 2018-08-31: qty 1

## 2018-08-31 NOTE — Progress Notes (Signed)
Patient tolerated injection with no complaints voiced.  Site clean and dry with no bruising or swelling noted at site.  Band aid applied.  VSs with discharge and left ambulatory with no s/s of distress noted.

## 2018-08-31 NOTE — Patient Instructions (Signed)
[facility]  Discharge Instructions:   _______________________________________________________________  Thank you for choosing [facility] at [facility] to provide your oncology and hematology care.  To afford each patient quality time with our providers, please arrive at least 15 minutes before your scheduled appointment.  You need to re-schedule your appointment if you arrive 10 or more minutes late.  We strive to give you quality time with our providers, and arriving late affects you and other patients whose appointments are after yours.  Also, if you no show three or more times for appointments you may be dismissed from the clinic.  Again, thank you for choosing [facility] at [facility]. Our hope is that these requests will allow you access to exceptional care and in a timely manner. _______________________________________________________________  If you have questions after your visit, please contact our office at [phone] between the hours of 8:30 a.m. and 5:00 p.m. Voicemails left after 4:30 p.m. will not be returned until the following business day. _______________________________________________________________  For prescription refill requests, have your pharmacy contact our office. _______________________________________________________________  Recommendations made by the consultant and any test results will be sent to your referring physician. _______________________________________________________________ 

## 2018-09-01 ENCOUNTER — Other Ambulatory Visit (HOSPITAL_COMMUNITY)
Admission: RE | Admit: 2018-09-01 | Discharge: 2018-09-01 | Disposition: A | Discharge status: Home / Self Care | Payer: Medicare Other | Source: Ambulatory Visit | Attending: Nephrology | Admitting: Nephrology

## 2018-09-01 DIAGNOSIS — R809 Proteinuria, unspecified: Secondary | ICD-10-CM | POA: Insufficient documentation

## 2018-09-01 DIAGNOSIS — E559 Vitamin D deficiency, unspecified: Secondary | ICD-10-CM | POA: Insufficient documentation

## 2018-09-01 DIAGNOSIS — N183 Chronic kidney disease, stage 3 (moderate): Secondary | ICD-10-CM | POA: Insufficient documentation

## 2018-09-01 DIAGNOSIS — I10 Essential (primary) hypertension: Secondary | ICD-10-CM | POA: Diagnosis present

## 2018-09-01 DIAGNOSIS — Z79899 Other long term (current) drug therapy: Secondary | ICD-10-CM | POA: Diagnosis present

## 2018-09-01 LAB — HEMOGLOBIN AND HEMATOCRIT, BLOOD
HEMATOCRIT: 30.6 % — AB (ref 36.0–46.0)
HEMOGLOBIN: 10 g/dL — AB (ref 12.0–15.0)

## 2018-09-01 LAB — RENAL FUNCTION PANEL
ALBUMIN: 3.7 g/dL (ref 3.5–5.0)
Anion gap: 4 — ABNORMAL LOW (ref 5–15)
BUN: 17 mg/dL (ref 8–23)
CHLORIDE: 113 mmol/L — AB (ref 98–111)
CO2: 23 mmol/L (ref 22–32)
CREATININE: 1.84 mg/dL — AB (ref 0.44–1.00)
Calcium: 9 mg/dL (ref 8.9–10.3)
GFR, EST AFRICAN AMERICAN: 32 mL/min — AB (ref >60)
GFR, EST NON AFRICAN AMERICAN: 28 mL/min — AB (ref >60)
Glucose, Bld: 105 mg/dL — ABNORMAL HIGH (ref 70–99)
PHOSPHORUS: 4.4 mg/dL (ref 2.5–4.6)
Potassium: 4.2 mmol/L (ref 3.5–5.1)
Sodium: 140 mmol/L (ref 135–145)

## 2018-09-01 LAB — IRON AND TIBC
Iron: 77 ug/dL (ref 28–170)
SATURATION RATIOS: 28 % (ref 10.4–31.8)
TIBC: 279 ug/dL (ref 250–450)
UIBC: 202 ug/dL

## 2018-09-01 LAB — FERRITIN: Ferritin: 181 ng/mL (ref 11–307)

## 2018-09-02 LAB — VITAMIN D 25 HYDROXY (VIT D DEFICIENCY, FRACTURES): Vit D, 25-Hydroxy: 31.9 ng/mL (ref 30.0–100.0)

## 2018-09-02 LAB — PARATHYROID HORMONE, INTACT (NO CA): PTH: 39 pg/mL (ref 15–65)

## 2018-09-22 ENCOUNTER — Telehealth: Payer: Self-pay | Admitting: Internal Medicine

## 2018-09-22 ENCOUNTER — Other Ambulatory Visit (HOSPITAL_COMMUNITY): Payer: Medicare Other

## 2018-09-22 NOTE — Telephone Encounter (Signed)
Pt walked in office stating she was suppose to have labs done today prior to her appointment. Pt was given lab orders at her last apt, completed them and no new orders were recommended. Pt is aware that she has lab orders for another Dr. Maudie Mercury apt is next week with [facility], if lab work is needed, they can discuss further.

## 2018-09-22 NOTE — Telephone Encounter (Signed)
[phone]  PATIENT CALLED AND SAID SHE IS AT [facility] TO HAVE HER LABS TAKEN BUT THEY HAVE NO ORDERS

## 2018-09-27 ENCOUNTER — Ambulatory Visit (HOSPITAL_COMMUNITY): Payer: Medicare Other | Admitting: Hematology

## 2018-09-27 ENCOUNTER — Ambulatory Visit (HOSPITAL_COMMUNITY): Payer: Medicare Other

## 2018-09-28 ENCOUNTER — Encounter: Payer: Self-pay | Admitting: *Deleted

## 2018-09-28 ENCOUNTER — Ambulatory Visit: Payer: Medicare Other | Admitting: Nurse Practitioner

## 2018-09-28 ENCOUNTER — Other Ambulatory Visit: Payer: Self-pay | Admitting: *Deleted

## 2018-09-28 ENCOUNTER — Inpatient Hospital Stay (HOSPITAL_COMMUNITY): Payer: Medicare Other | Attending: Hematology

## 2018-09-28 ENCOUNTER — Telehealth: Payer: Self-pay | Admitting: *Deleted

## 2018-09-28 VITALS — BP 132/71 | HR 73 | Temp 97.1°F | Ht 61.0 in | Wt 183.6 lb

## 2018-09-28 DIAGNOSIS — R932 Abnormal findings on diagnostic imaging of liver and biliary tract: Secondary | ICD-10-CM

## 2018-09-28 DIAGNOSIS — K7469 Other cirrhosis of liver: Secondary | ICD-10-CM

## 2018-09-28 DIAGNOSIS — D509 Iron deficiency anemia, unspecified: Secondary | ICD-10-CM | POA: Insufficient documentation

## 2018-09-28 DIAGNOSIS — K769 Liver disease, unspecified: Secondary | ICD-10-CM

## 2018-09-28 DIAGNOSIS — E538 Deficiency of other specified B group vitamins: Secondary | ICD-10-CM

## 2018-09-28 DIAGNOSIS — K7460 Unspecified cirrhosis of liver: Secondary | ICD-10-CM | POA: Diagnosis not present

## 2018-09-28 DIAGNOSIS — Z79899 Other long term (current) drug therapy: Secondary | ICD-10-CM | POA: Insufficient documentation

## 2018-09-28 DIAGNOSIS — I8510 Secondary esophageal varices without bleeding: Secondary | ICD-10-CM

## 2018-09-28 DIAGNOSIS — D500 Iron deficiency anemia secondary to blood loss (chronic): Secondary | ICD-10-CM

## 2018-09-28 DIAGNOSIS — I8500 Esophageal varices without bleeding: Secondary | ICD-10-CM

## 2018-09-28 LAB — COMPREHENSIVE METABOLIC PANEL
ALT: 18 U/L (ref 0–44)
AST: 27 U/L (ref 15–41)
Albumin: 3.7 g/dL (ref 3.5–5.0)
Alkaline Phosphatase: 60 U/L (ref 38–126)
Anion gap: 6 (ref 5–15)
BUN: 16 mg/dL (ref 8–23)
CALCIUM: 8.9 mg/dL (ref 8.9–10.3)
CO2: 26 mmol/L (ref 22–32)
Chloride: 107 mmol/L (ref 98–111)
Creatinine, Ser: 1.7 mg/dL — ABNORMAL HIGH (ref 0.44–1.00)
GFR calc Af Amer: 35 mL/min — ABNORMAL LOW (ref >60)
GFR calc non Af Amer: 30 mL/min — ABNORMAL LOW (ref >60)
Glucose, Bld: 189 mg/dL — ABNORMAL HIGH (ref 70–99)
Potassium: 4.1 mmol/L (ref 3.5–5.1)
SODIUM: 139 mmol/L (ref 135–145)
Total Bilirubin: 2.9 mg/dL — ABNORMAL HIGH (ref 0.3–1.2)
Total Protein: 6.7 g/dL (ref 6.5–8.1)

## 2018-09-28 LAB — CBC WITH DIFFERENTIAL/PLATELET
Abs Immature Granulocytes: 0.01 10*3/uL (ref 0.00–0.07)
Basophils Absolute: 0 10*3/uL (ref 0.0–0.1)
Basophils Relative: 1 %
Eosinophils Absolute: 0.1 10*3/uL (ref 0.0–0.5)
Eosinophils Relative: 4 %
HCT: 31.8 % — ABNORMAL LOW (ref 36.0–46.0)
HEMOGLOBIN: 10 g/dL — AB (ref 12.0–15.0)
Immature Granulocytes: 0 %
Lymphocytes Relative: 27 %
Lymphs Abs: 0.8 10*3/uL (ref 0.7–4.0)
MCH: 29.6 pg (ref 26.0–34.0)
MCHC: 31.4 g/dL (ref 30.0–36.0)
MCV: 94.1 fL (ref 80.0–100.0)
Monocytes Absolute: 0.3 10*3/uL (ref 0.1–1.0)
Monocytes Relative: 8 %
NRBC: 0 % (ref 0.0–0.2)
Neutro Abs: 1.8 10*3/uL (ref 1.7–7.7)
Neutrophils Relative %: 60 %
Platelets: 51 10*3/uL — ABNORMAL LOW (ref 150–400)
RBC: 3.38 MIL/uL — AB (ref 3.87–5.11)
RDW: 13.4 % (ref 11.5–15.5)
WBC: 3.1 10*3/uL — ABNORMAL LOW (ref 4.0–10.5)

## 2018-09-28 LAB — VITAMIN B12: Vitamin B-12: 818 pg/mL (ref 180–914)

## 2018-09-28 LAB — IRON AND TIBC
Iron: 74 ug/dL (ref 28–170)
Saturation Ratios: 26 % (ref 10.4–31.8)
TIBC: 286 ug/dL (ref 250–450)
UIBC: 212 ug/dL

## 2018-09-28 LAB — FERRITIN: Ferritin: 153 ng/mL (ref 11–307)

## 2018-09-28 LAB — FOLATE: Folate: 9.4 ng/mL (ref >5.9)

## 2018-09-28 NOTE — Progress Notes (Signed)
Referring Provider: Glenda Chroman, MD Primary Care Physician:  Glenda Chroman, MD Primary GI:  Dr. Gala Romney  Chief Complaint  Patient presents with  . Cirrhosis    HPI:   Alisha [name] is a 68 y.o. female who presents for routine follow-up on cirrhosis.  Patient was last seen in our office 03/28/2018 for cirrhosis, abnormal MRI of the liver, secondary esophageal varices.  The patient is also seen by [facility].  Likely [place] cirrhosis.  Colonoscopy up-to-date 2018 and due for repeat in 2023.  Was evaluated by [facility] transplant noted meld score of 13 with controlled esophageal variceal bleeding with banding protocol and recommended not listing the patient at that time but continue with a 60-monthfollow-up.  At her last visit she was doing well overall, and denied abdominal pain.  Rare/intermittent nausea, no other GI symptoms or hepatic symptoms.  Still does have generalized persistent fatigue and B12 helps with this.  Thinks her fatigue is related to Inderal with, which is quite possible.  However, she states her fatigue is tolerable.  No other concerning symptoms.  Recommended continue current medications, follow-up MRI, routine labs, follow-up in 6 months.  CBC completed 03/24/2018 found stable hemoglobin at 11.5, persistent though baseline thrombocytopenia with a platelet count of 48.  CMP completed 03/28/2018 found further increased creatinine 1.62, bilirubin remains elevated but stable at 2.3, other LFTs normal.  Meld score calculated at 16, child Pugh class A.  Her labs were forwarded to nephrology and [facility] transplant.  Follow-up MRI dated 04/04/2018 which found hepatic cirrhosis with no new or suspicious liver masses.  Previously noted subcentimeter category 3 segment for a left liver lobe lesion has decreased in size and probably benign.  Recommended MRI abdomen with and without IV contrast in 6 months for surveillance.  Also noted stable moderate to marketed  splenomegaly with large periumbilical and lower esophageal varices without ascites.  Stable cystic pancreatic lesions without high risk features and recommended follow-up MRI abdomen with and without contrast in 2 years.  She was seen by nephrology on 06/22/2018 and chronic renal failure stage IV felt likely due to his long history of diabetes and hypertension.  They recommended holding metformin and recheck renal panel, otherwise follow-up in 2 months with updated labs.  Patient was also again seen by [facility] on 08/16/2018.  At that time they recommended follow-up MRI locally, continue local GI care.  Her meld score at the time of her transplant visit was 18 although noted to be primarily driven by her creatinine with kidney disease as discussed above.  They recommended follow-up with their service in 6 months.  [facility] also recommended sooner EGD than 1 year for G2EV and histoy of variceal bleed, based on AASLD recommendations.  Today she states doing well overall. Denies abdominal pain, NN/V, hematochezia, melena, fever, chills, unintentional weight loss. Fatigue is a little better after B12 which gradually worsens until next B12 injection. Denies yellowing of skin/eyes, darkened urine, tremors, acute episodic confusion, generalized pruritis. Denies chest pain, dyspnea, dizziness, lightheadedness, syncope, near syncope. Denies any other upper or lower GI symptoms.  Past Medical History:  Diagnosis Date  . B12 deficiency 11/03/2016  . B12 deficiency 11/03/2016  . Barrett's esophagus   . Cirrhosis of liver without mention of alcohol    hep B surface antigen and HCV ab negative.pt has not had hepatitis A and B vaccines.U/S on 07/04/13 shows cirrhosis.  . Diverticula of colon  pancolonic  . Esophageal reflux   . Esophageal varices ([place])   . Hiatal hernia   . Iron deficiency anemia, unspecified   . Other and unspecified hyperlipidemia   . Portal hypertensive gastropathy ([place])   . Tubular  adenoma   . Type II or unspecified type diabetes mellitus without mention of complication, not stated as uncontrolled   . Unspecified essential hypertension   . Unspecified hemorrhoids without mention of complication     Past Surgical History:  Procedure Laterality Date  . ABDOMINAL HYSTERECTOMY    . BIOPSY  04/30/2016   Procedure: BIOPSY;  Surgeon: Daneil Dolin, MD;  [facility];  Service: Endoscopy;;  esophagus  . CESAREAN SECTION     x 2  . COLONOSCOPY  03/2006   left sided diverticula, splenic flexure tublar adenoma  . COLONOSCOPY  07/2002   villous tubular adenoma in rectum  . COLONOSCOPY  09/23/09   external hemorrhoids/scattered pan colonic diverticula otherwise normal. cecal lipoma bx negative, normal TI. Next TCS 09/2014  . COLONOSCOPY N/A 11/12/2014   Procedure: COLONOSCOPY;  Surgeon: Daneil Dolin, MD;  [facility];  Service: Endoscopy;  Laterality: N/A;  1215 - moved to 12:30 - Ginger to notify pt  . COLONOSCOPY N/A 01/07/2017   Procedure: COLONOSCOPY;  Surgeon: Daneil Dolin, MD;  [facility];  Service: Endoscopy;  Laterality: N/A;  3:15PM  . ESOPHAGEAL BANDING N/A 12/20/2016   Procedure: ESOPHAGEAL BANDING;  Surgeon: Danie Binder, MD;  [facility];  Service: Endoscopy;  Laterality: N/A;  . ESOPHAGEAL BANDING N/A 03/18/2017   Procedure: ESOPHAGEAL BANDING;  Surgeon: Daneil Dolin, MD;  [facility];  Service: Endoscopy;  Laterality: N/A;  . ESOPHAGEAL BANDING  12/17/2017   Procedure: ESOPHAGEAL BANDING;  Surgeon: Daneil Dolin, MD;  [facility];  Service: Endoscopy;;  . ESOPHAGOGASTRODUODENOSCOPY  08/2006   barretts esophagus, no dyplasia  . ESOPHAGOGASTRODUODENOSCOPY  03/2006   barretts esophagus,bx focal atypia c/w low grade dysplasia, SB bx negative for celiac  . ESOPHAGOGASTRODUODENOSCOPY  09/23/09   3 columns of grade 2 esophageal varices/hiatal hernia/3-4 cm segment Barrett's esophagus without  dysplasia. Next EGD 09/2012  . ESOPHAGOGASTRODUODENOSCOPY N/A 11/11/2012   Dr. Gala Romney- Barrett;s esophagus, esophageal varices, portal gastopathy. hiatal hernia  . ESOPHAGOGASTRODUODENOSCOPY N/A 04/30/2016   Procedure: ESOPHAGOGASTRODUODENOSCOPY (EGD);  Surgeon: Daneil Dolin, MD;  [facility];  Service: Endoscopy;  Laterality: N/A;  815  . ESOPHAGOGASTRODUODENOSCOPY N/A 12/20/2016   Procedure: ESOPHAGOGASTRODUODENOSCOPY (EGD);  Surgeon: Danie Binder, MD;  [facility];  Service: Endoscopy;  Laterality: N/A;  . ESOPHAGOGASTRODUODENOSCOPY N/A 01/07/2017   Procedure: ESOPHAGOGASTRODUODENOSCOPY (EGD);  Surgeon: Daneil Dolin, MD;  [facility];  Service: Endoscopy;  Laterality: N/A;  . ESOPHAGOGASTRODUODENOSCOPY N/A 03/18/2017   Procedure: ESOPHAGOGASTRODUODENOSCOPY (EGD);  Surgeon: Daneil Dolin, MD;  [facility];  Service: Endoscopy;  Laterality: N/A;  1030   . ESOPHAGOGASTRODUODENOSCOPY N/A 12/17/2017   Procedure: ESOPHAGOGASTRODUODENOSCOPY (EGD);  Surgeon: Daneil Dolin, MD;  [facility];  Service: Endoscopy;  Laterality: N/A;  10:30am  . HEMORRHOID SURGERY  2008  . small bowel capsule endoscopy  10/2009   normal    Current Outpatient Medications  Medication Sig Dispense Refill  . Cholecalciferol (VITAMIN D) 2000 units tablet Take 2,000 Units by mouth daily.    . cyanocobalamin (,VITAMIN B-12,) 1000 MCG/ML injection Inject 1,000 mcg into the muscle every 30 (thirty) days.     Marland Kitchen  GLIPIZIDE XL 10 MG 24 hr tablet Take 10 mg by mouth daily.     Marland Kitchen lisinopril (PRINIVIL,ZESTRIL) 10 MG tablet Take 10 mg by mouth daily.     . pantoprazole (PROTONIX) 40 MG tablet Take 1 tablet (40 mg total) by mouth 2 (two) times daily before a meal. 60 tablet 1  . propranolol (INDERAL) 10 MG tablet TAKE THREE (3) TABLETS BY MOUTH EVERY MORNING AND 3 TABLETS EVERY EVENING. 180 tablet 11  . TRESIBA FLEXTOUCH 200 UNIT/ML SOPN Inject into the skin daily. Uses  sliding scale once a day  12  . VICTOZA 18 MG/3ML SOLN Inject 1.8 mg into the skin daily.      No current facility-administered medications for this visit.     Allergies as of 09/28/2018 - Review Complete 09/28/2018  Allergen Reaction Noted  . Codeine Nausea And Vomiting 04/19/2012    Family History  Problem Relation Age of Onset  . Alzheimer's disease Mother   . CAD Father   . Diabetes Mellitus II Father   . Alzheimer's disease Father   . Diabetes Mellitus II Sister   . Colon cancer Sister 58       small, surgical excision; chemo/rad not needed  . Cancer - Other Brother   . Diabetes Mellitus II Brother   . Hypertension Brother   . Liver disease Neg Hx     Social History   Socioeconomic History  . Marital status: Married    Spouse name: Not on file  . Number of children: Not on file  . Years of education: Not on file  . Highest education level: Not on file  Occupational History  . Not on file  Social Needs  . Financial resource strain: Not on file  . Food insecurity:    Worry: Not on file    Inability: Not on file  . Transportation needs:    Medical: Not on file    Non-medical: Not on file  Tobacco Use  . Smoking status: Never Smoker  . Smokeless tobacco: Never Used  Substance and Sexual Activity  . Alcohol use: No  . Drug use: No  . Sexual activity: Yes  Lifestyle  . Physical activity:    Days per week: Not on file    Minutes per session: Not on file  . Stress: Not on file  Relationships  . Social connections:    Talks on phone: Not on file    Gets together: Not on file    Attends religious service: Not on file    Active member of club or organization: Not on file    Attends meetings of clubs or organizations: Not on file    Relationship status: Not on file  Other Topics Concern  . Not on file  Social History Narrative  . Not on file    Review of Systems: General: Negative for anorexia, weight loss, fever, chills, fatigue, weakness. ENT:  Negative for hoarseness, difficulty swallowing. CV: Negative for chest pain, angina, palpitations, peripheral edema.  Respiratory: Negative for dyspnea at rest, cough, sputum, wheezing.  GI: See history of present illness. Endo: Negative for unusual weight change.  Heme: Negative for bruising or bleeding. Allergy: Negative for rash or hives.   Physical Exam: BP 132/71   Pulse 73   Temp (!) 97.1 F (36.2 C) (Oral)   Ht 5' 1"  (1.549 m)   Wt 183 lb 9.6 oz (83.3 kg)   BMI 34.69 kg/m  General:   Alert and oriented. Pleasant and  cooperative. Well-nourished and well-developed.  Eyes:  Without icterus, sclera clear and conjunctiva pink.  Ears:  Normal auditory acuity. Cardiovascular:  S1, S2 present without murmurs appreciated. Extremities without clubbing or edema. Respiratory:  Clear to auscultation bilaterally. No wheezes, rales, or rhonchi. No distress.  Gastrointestinal:  +BS, soft, non-tender and non-distended. No HSM noted. No guarding or rebound. No masses appreciated.  Rectal:  Deferred  Musculoskalatal:  Symmetrical without gross deformities. Neurologic:  Alert and oriented x4;  grossly normal neurologically. Psych:  Alert and cooperative. Normal mood and affect. Heme/Lymph/Immune: No excessive bruising noted.    09/28/2018 11:23 AM   Disclaimer: This note was dictated with voice recognition software. Similar sounding words can inadvertently be transcribed and may not be corrected upon review.

## 2018-09-28 NOTE — Telephone Encounter (Signed)
[place] website to see if PA is required for MRI. Received message that precert is not required.

## 2018-09-28 NOTE — Patient Instructions (Signed)
Your health issues we discussed today were:   Cirrhosis: 1. Continue to follow-up with transplant center at [facility] as they recommend. 2. Have your labs drawn in 6 months at [facility]. 3. You can discuss possible living donor transplant with your [facility] team at your next visit.  Abnormal MRI of your liver: 1. We will repeat your MRI now. 2. Our office will help schedule this for you. 3. We will call you with the results when we receive them.  Esophageal varices (swollen blood vessels) related to cirrhosis: 1. We will schedule an upper endoscopy for you to recheck your varices and put rubber bands if needed. 2. Further recommendations will be made after your endoscopy.  Overall I recommend:  1. Continue your current medications. 2. Continue to see nephrology and [facility] transplant 3. Follow-up in 6 months. 4. Call us if you have any questions or concerns.  At [facility] we value your feedback. You may receive a survey about your visit today. Please share your experience as we strive to create trusting relationships with our patients to provide genuine, compassionate, quality care.  We appreciate your understanding and patience as we review any laboratory studies, imaging, and other diagnostic tests that are ordered as we care for you. Our office policy is 5 business days for review of these results, and any emergent or urgent results are addressed in a timely manner for your best interest. If you do not hear from our office in 1 week, please contact us.   We also encourage the use of MyChart, which contains your medical information for your review as well. If you are not enrolled in this feature, an access code is on this after visit summary for your convenience. Thank you for allowing [name] to be involved in your care.  It was great to see you today!  I hope you have a great day!!

## 2018-09-29 ENCOUNTER — Ambulatory Visit (HOSPITAL_COMMUNITY): Payer: Medicare Other

## 2018-09-29 ENCOUNTER — Ambulatory Visit (HOSPITAL_COMMUNITY): Payer: Medicare Other | Admitting: Hematology

## 2018-10-03 ENCOUNTER — Inpatient Hospital Stay (HOSPITAL_COMMUNITY): Payer: Medicare Other

## 2018-10-03 ENCOUNTER — Encounter (HOSPITAL_COMMUNITY): Payer: Self-pay | Admitting: Hematology

## 2018-10-03 ENCOUNTER — Other Ambulatory Visit: Payer: Self-pay

## 2018-10-03 ENCOUNTER — Inpatient Hospital Stay (HOSPITAL_COMMUNITY): Payer: Medicare Other | Attending: Hematology | Admitting: Hematology

## 2018-10-03 VITALS — BP 136/52 | HR 68 | Temp 97.8°F | Resp 16 | Wt 182.6 lb

## 2018-10-03 DIAGNOSIS — K7460 Unspecified cirrhosis of liver: Secondary | ICD-10-CM | POA: Diagnosis not present

## 2018-10-03 DIAGNOSIS — K219 Gastro-esophageal reflux disease without esophagitis: Secondary | ICD-10-CM | POA: Diagnosis not present

## 2018-10-03 DIAGNOSIS — Z800 Family history of malignant neoplasm of digestive organs: Secondary | ICD-10-CM | POA: Insufficient documentation

## 2018-10-03 DIAGNOSIS — E1122 Type 2 diabetes mellitus with diabetic chronic kidney disease: Secondary | ICD-10-CM | POA: Diagnosis not present

## 2018-10-03 DIAGNOSIS — N189 Chronic kidney disease, unspecified: Secondary | ICD-10-CM | POA: Diagnosis not present

## 2018-10-03 DIAGNOSIS — I129 Hypertensive chronic kidney disease with stage 1 through stage 4 chronic kidney disease, or unspecified chronic kidney disease: Secondary | ICD-10-CM | POA: Diagnosis not present

## 2018-10-03 DIAGNOSIS — E785 Hyperlipidemia, unspecified: Secondary | ICD-10-CM | POA: Insufficient documentation

## 2018-10-03 DIAGNOSIS — K766 Portal hypertension: Secondary | ICD-10-CM | POA: Insufficient documentation

## 2018-10-03 DIAGNOSIS — D509 Iron deficiency anemia, unspecified: Secondary | ICD-10-CM | POA: Diagnosis not present

## 2018-10-03 DIAGNOSIS — Z79899 Other long term (current) drug therapy: Secondary | ICD-10-CM | POA: Insufficient documentation

## 2018-10-03 DIAGNOSIS — E1165 Type 2 diabetes mellitus with hyperglycemia: Secondary | ICD-10-CM | POA: Diagnosis not present

## 2018-10-03 DIAGNOSIS — R944 Abnormal results of kidney function studies: Secondary | ICD-10-CM | POA: Diagnosis not present

## 2018-10-03 DIAGNOSIS — R5383 Other fatigue: Secondary | ICD-10-CM | POA: Diagnosis not present

## 2018-10-03 DIAGNOSIS — D538 Other specified nutritional anemias: Secondary | ICD-10-CM

## 2018-10-03 DIAGNOSIS — K449 Diaphragmatic hernia without obstruction or gangrene: Secondary | ICD-10-CM | POA: Insufficient documentation

## 2018-10-03 DIAGNOSIS — D731 Hypersplenism: Secondary | ICD-10-CM | POA: Diagnosis not present

## 2018-10-03 DIAGNOSIS — D696 Thrombocytopenia, unspecified: Secondary | ICD-10-CM | POA: Insufficient documentation

## 2018-10-03 DIAGNOSIS — D500 Iron deficiency anemia secondary to blood loss (chronic): Secondary | ICD-10-CM | POA: Insufficient documentation

## 2018-10-03 DIAGNOSIS — E538 Deficiency of other specified B group vitamins: Secondary | ICD-10-CM | POA: Insufficient documentation

## 2018-10-03 DIAGNOSIS — Z7984 Long term (current) use of oral hypoglycemic drugs: Secondary | ICD-10-CM | POA: Insufficient documentation

## 2018-10-03 MED ORDER — CYANOCOBALAMIN 1000 MCG/ML IJ SOLN
INTRAMUSCULAR | Status: AC
  Filled 2018-10-03: qty 1

## 2018-10-03 MED ORDER — CYANOCOBALAMIN 1000 MCG/ML IJ SOLN
1000.0000 ug | Freq: Once | INTRAMUSCULAR | Status: AC
  Administered 2018-10-03: 1000 ug via INTRAMUSCULAR

## 2018-10-03 NOTE — Progress Notes (Signed)
Alisha [name], Alisha [name]   CLINIC:  Medical Oncology/Hematology  PCP:  Glenda Chroman, MD [place] [id number] [phone]   REASON FOR VISIT: Follow-up for iron deficiency anemia  CURRENT THERAPY: intermittent feraheme infusions.   INTERVAL HISTORY:  [name] 68 y.o. female returns for routine follow-up for iron deficiency anemia. She is here today with her husband and doing well. She reports her fatigue is better and she is able to do more. She has no complaints at this time. Denies any nausea, vomiting, or diarrhea. Denies any new pains. Had not noticed any recent bleeding such as epistaxis, hematuria or hematochezia. Denies recent chest pain on exertion, shortness of breath on minimal exertion, pre-syncopal episodes, or palpitations. Denies any numbness or tingling in hands or feet. Denies any recent fevers, infections, or recent hospitalizations. Patient reports appetite at 75% and energy level at 75%.   REVIEW OF SYSTEMS:  Review of Systems  All other systems reviewed and are negative.    PAST MEDICAL/SURGICAL HISTORY:  Past Medical History:  Diagnosis Date  . B12 deficiency 11/03/2016  . B12 deficiency 11/03/2016  . Barrett's esophagus   . Cirrhosis of liver without mention of alcohol    hep B surface antigen and HCV ab negative.pt has not had hepatitis A and B vaccines.U/S on 07/04/13 shows cirrhosis.  . Diverticula of colon    pancolonic  . Esophageal reflux   . Esophageal varices ([place])   . Hiatal hernia   . Iron deficiency anemia, unspecified   . Other and unspecified hyperlipidemia   . Portal hypertensive gastropathy ([place])   . Tubular adenoma   . Type II or unspecified type diabetes mellitus without mention of complication, not stated as uncontrolled   . Unspecified essential hypertension   . Unspecified hemorrhoids without mention of complication    Past Surgical History:  Procedure Laterality Date  .  ABDOMINAL HYSTERECTOMY    . BIOPSY  04/30/2016   Procedure: BIOPSY;  Surgeon: Daneil Dolin, MD;  [facility];  Service: Endoscopy;;  esophagus  . CESAREAN SECTION     x 2  . COLONOSCOPY  03/2006   left sided diverticula, splenic flexure tublar adenoma  . COLONOSCOPY  07/2002   villous tubular adenoma in rectum  . COLONOSCOPY  09/23/09   external hemorrhoids/scattered pan colonic diverticula otherwise normal. cecal lipoma bx negative, normal TI. Next TCS 09/2014  . COLONOSCOPY N/A 11/12/2014   Procedure: COLONOSCOPY;  Surgeon: Daneil Dolin, MD;  [facility];  Service: Endoscopy;  Laterality: N/A;  1215 - moved to 12:30 - Ginger to notify pt  . COLONOSCOPY N/A 01/07/2017   Procedure: COLONOSCOPY;  Surgeon: Daneil Dolin, MD;  [facility];  Service: Endoscopy;  Laterality: N/A;  3:15PM  . ESOPHAGEAL BANDING N/A 12/20/2016   Procedure: ESOPHAGEAL BANDING;  Surgeon: Danie Binder, MD;  [facility];  Service: Endoscopy;  Laterality: N/A;  . ESOPHAGEAL BANDING N/A 03/18/2017   Procedure: ESOPHAGEAL BANDING;  Surgeon: Daneil Dolin, MD;  [facility];  Service: Endoscopy;  Laterality: N/A;  . ESOPHAGEAL BANDING  12/17/2017   Procedure: ESOPHAGEAL BANDING;  Surgeon: Daneil Dolin, MD;  [facility];  Service: Endoscopy;;  . ESOPHAGOGASTRODUODENOSCOPY  08/2006   barretts esophagus, no dyplasia  . ESOPHAGOGASTRODUODENOSCOPY  03/2006   barretts esophagus,bx focal atypia c/w low grade dysplasia, SB bx negative for celiac  .  ESOPHAGOGASTRODUODENOSCOPY  09/23/09   3 columns of grade 2 esophageal varices/hiatal hernia/3-4 cm segment Barrett's esophagus without dysplasia. Next EGD 09/2012  . ESOPHAGOGASTRODUODENOSCOPY N/A 11/11/2012   Dr. Gala Romney- Barrett;s esophagus, esophageal varices, portal gastopathy. hiatal hernia  . ESOPHAGOGASTRODUODENOSCOPY N/A 04/30/2016   Procedure: ESOPHAGOGASTRODUODENOSCOPY (EGD);  Surgeon: Daneil Dolin, MD;   [facility];  Service: Endoscopy;  Laterality: N/A;  815  . ESOPHAGOGASTRODUODENOSCOPY N/A 12/20/2016   Procedure: ESOPHAGOGASTRODUODENOSCOPY (EGD);  Surgeon: Danie Binder, MD;  [facility];  Service: Endoscopy;  Laterality: N/A;  . ESOPHAGOGASTRODUODENOSCOPY N/A 01/07/2017   Procedure: ESOPHAGOGASTRODUODENOSCOPY (EGD);  Surgeon: Daneil Dolin, MD;  [facility];  Service: Endoscopy;  Laterality: N/A;  . ESOPHAGOGASTRODUODENOSCOPY N/A 03/18/2017   Procedure: ESOPHAGOGASTRODUODENOSCOPY (EGD);  Surgeon: Daneil Dolin, MD;  [facility];  Service: Endoscopy;  Laterality: N/A;  1030   . ESOPHAGOGASTRODUODENOSCOPY N/A 12/17/2017   Procedure: ESOPHAGOGASTRODUODENOSCOPY (EGD);  Surgeon: Daneil Dolin, MD;  [facility];  Service: Endoscopy;  Laterality: N/A;  10:30am  . HEMORRHOID SURGERY  2008  . small bowel capsule endoscopy  10/2009   normal     SOCIAL HISTORY:  Social History   Socioeconomic History  . Marital status: Married    Spouse name: Not on file  . Number of children: Not on file  . Years of education: Not on file  . Highest education level: Not on file  Occupational History  . Not on file  Social Needs  . Financial resource strain: Not on file  . Food insecurity:    Worry: Not on file    Inability: Not on file  . Transportation needs:    Medical: Not on file    Non-medical: Not on file  Tobacco Use  . Smoking status: Never Smoker  . Smokeless tobacco: Never Used  Substance and Sexual Activity  . Alcohol use: No  . Drug use: No  . Sexual activity: Yes  Lifestyle  . Physical activity:    Days per week: Not on file    Minutes per session: Not on file  . Stress: Not on file  Relationships  . Social connections:    Talks on phone: Not on file    Gets together: Not on file    Attends religious service: Not on file    Active member of club or organization: Not on file    Attends meetings of clubs or  organizations: Not on file    Relationship status: Not on file  . Intimate partner violence:    Fear of current or ex partner: Not on file    Emotionally abused: Not on file    Physically abused: Not on file    Forced sexual activity: Not on file  Other Topics Concern  . Not on file  Social History Narrative  . Not on file    FAMILY HISTORY:  Family History  Problem Relation Age of Onset  . Alzheimer's disease Mother   . CAD Father   . Diabetes Mellitus II Father   . Alzheimer's disease Father   . Diabetes Mellitus II Sister   . Colon cancer Sister 73       small, surgical excision; chemo/rad not needed  . Cancer - Other Brother   . Diabetes Mellitus II Brother   . Hypertension Brother   . Liver disease Neg Hx     CURRENT MEDICATIONS:  Outpatient Encounter Medications as of 10/03/2018  Medication Sig  .  Cholecalciferol (VITAMIN D) 2000 units tablet Take 2,000 Units by mouth daily.  . cyanocobalamin (,VITAMIN B-12,) 1000 MCG/ML injection Inject 1,000 mcg into the muscle every 30 (thirty) days.   Marland Kitchen GLIPIZIDE XL 10 MG 24 hr tablet Take 10 mg by mouth daily.   Marland Kitchen lisinopril (PRINIVIL,ZESTRIL) 10 MG tablet Take 10 mg by mouth daily.   Marland Kitchen NOVOFINE 32G X 6 MM MISC USE ONE AS DIRECTED TWICE DAILY.  . pantoprazole (PROTONIX) 40 MG tablet Take 1 tablet (40 mg total) by mouth 2 (two) times daily before a meal.  . propranolol (INDERAL) 10 MG tablet TAKE THREE (3) TABLETS BY MOUTH EVERY MORNING AND 3 TABLETS EVERY EVENING.  Tyler Aas FLEXTOUCH 200 UNIT/ML SOPN Inject into the skin daily. Uses sliding scale once a day  . VICTOZA 18 MG/3ML SOLN Inject 1.8 mg into the skin daily.    No facility-administered encounter medications on file as of 10/03/2018.     ALLERGIES:  Allergies  Allergen Reactions  . Codeine Nausea And Vomiting     PHYSICAL EXAM:  ECOG Performance status: 1  Vitals:   10/03/18 1100  BP: (!) 136/52  Pulse: 68  Resp: 16  Temp: 97.8 F (36.6 C)  SpO2: 96%    Filed Weights   10/03/18 1100  Weight: 182 lb 9 oz (82.8 kg)    Physical Exam Constitutional:      Appearance: Normal appearance. She is normal weight.  Cardiovascular:     Rate and Rhythm: Normal rate and regular rhythm.     Heart sounds: Normal heart sounds.  Pulmonary:     Effort: Pulmonary effort is normal.     Breath sounds: Normal breath sounds.  Abdominal:     General: Abdomen is flat.     Palpations: Abdomen is soft.  Musculoskeletal: Normal range of motion.  Skin:    General: Skin is warm and dry.  Neurological:     Mental Status: She is alert and oriented to person, place, and time. Mental status is at baseline.  Psychiatric:        Mood and Affect: Mood normal.        Behavior: Behavior normal.        Thought Content: Thought content normal.        Judgment: Judgment normal.   Abdomen: No palpable masses. Lymphadenopathy: No palpable lymphadenopathy.   LABORATORY DATA:  I have reviewed the labs as listed.  CBC    Component Value Date/Time   WBC 3.1 (L) 09/28/2018 1240   RBC 3.38 (L) 09/28/2018 1240   HGB 10.0 (L) 09/28/2018 1240   HCT 31.8 (L) 09/28/2018 1240   PLT 51 (L) 09/28/2018 1240   MCV 94.1 09/28/2018 1240   MCH 29.6 09/28/2018 1240   MCHC 31.4 09/28/2018 1240   RDW 13.4 09/28/2018 1240   LYMPHSABS 0.8 09/28/2018 1240   MONOABS 0.3 09/28/2018 1240   EOSABS 0.1 09/28/2018 1240   BASOSABS 0.0 09/28/2018 1240   CMP Latest Ref Rng & Units 09/28/2018 09/01/2018 06/20/2018  Glucose 70 - 99 mg/dL 189(H) 105(H) 227(H)  BUN 8 - 23 mg/dL 16 17 18   Creatinine 0.44 - 1.00 mg/dL 1.70(H) 1.84(H) 1.89(H)  Sodium 135 - 145 mmol/L 139 140 139  Potassium 3.5 - 5.1 mmol/L 4.1 4.2 5.3(H)  Chloride 98 - 111 mmol/L 107 113(H) 109  CO2 22 - 32 mmol/L 26 23 23   Calcium 8.9 - 10.3 mg/dL 8.9 9.0 9.2  Total Protein 6.5 - 8.1 g/dL 6.7 - 6.5  Total Bilirubin 0.3 - 1.2 mg/dL 2.9(H) - 2.9(H)  Alkaline Phos 38 - 126 U/L 60 - 63  AST 15 - 41 U/L 27 - 26  ALT 0 - 44  U/L 18 - 26       DIAGNOSTIC IMAGING:  I have independently reviewed the scans and discussed with the patient.   I have reviewed Francene Finders, NP's note and agree with the documentation.  I personally performed a face-to-face visit, made revisions and my assessment and plan is as follows.    ASSESSMENT & PLAN:   Iron deficiency anemia 1.  Normocytic anemia: - This is from combination of CKD, chronic blood loss and iron deficiency. -Last Feraheme was on 12/24/2017 and 01/03/2018. - Ferritin was 153, percent saturation was 25.  Hemoglobin was 10.  Ferritin and percent saturation were gradually trending down. -Hence I have recommended 1 infusion of Feraheme prior to next visit in 4 months.  2.  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 -Platelet count is more or less stable around 50.    3.  Elevated creatinine: -Creatinine is stable around 1.7.  4.  Cirrhosis: She follows up with liver transplant program at [facility]. -An MRI of the abdomen with and without contrast on 04/04/2018 shows cirrhosis with no liver mass.  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  5.  B12 deficiency: -B12 was normal.  She will continue B12 injections monthly.      Orders placed this encounter:  Orders Placed This Encounter  Procedures  . CBC with Differential/Platelet  . Comprehensive metabolic panel  . Ferritin  . Iron and TIBC  . Vitamin B12  . Folate      Derek Jack, MD [place] [phone]

## 2018-10-03 NOTE — Progress Notes (Signed)
Patient in room 411 is ready for her B12 injection

## 2018-10-03 NOTE — Assessment & Plan Note (Signed)
1.  Normocytic anemia: - This is from combination of CKD, chronic blood loss and iron deficiency. -Last Feraheme was on 12/24/2017 and 01/03/2018. - Ferritin was 153, percent saturation was 25.  Hemoglobin was 10.  Ferritin and percent saturation were gradually trending down. -Hence I have recommended 1 infusion of Feraheme prior to next visit in 4 months.  2.  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 -Platelet count is more or less stable around 50.    3.  Elevated creatinine: -Creatinine is stable around 1.7.  4.  Cirrhosis: She follows up with liver transplant program at [facility]. -An MRI of the abdomen with and without contrast on 04/04/2018 shows cirrhosis with no liver mass.  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  5.  B12 deficiency: -B12 was normal.  She will continue B12 injections monthly.

## 2018-10-03 NOTE — Progress Notes (Signed)
Pt here today for monthly B12 injection. Pt given injection in left deltoid. Pt tolerated injection with no complaints. Pt stable and discharged home ambulatory with husband. Pt to return in 1 month for next B12 injection.

## 2018-10-03 NOTE — Patient Instructions (Addendum)
[place] at [facility] [address] Discharge Instructions   Follow up in 3 months with labs prior. You will have one infusion on iron.  Thank you for choosing [place] at [facility] to provide your oncology and hematology care.  To afford each patient quality time with our provider, please arrive at least 15 minutes before your scheduled appointment time.   If you have a lab appointment with the [facility] please come in thru the  Main Entrance and check in at the main information desk  You need to re-schedule your appointment should you arrive 10 or more minutes late.  We strive to give you quality time with our providers, and arriving late affects you and other patients whose appointments are after yours.  Also, if you no show three or more times for appointments you may be dismissed from the clinic at the providers discretion.     Again, thank you for choosing [facility].  Our hope is that these requests will decrease the amount of time that you wait before being seen by our physicians.       _____________________________________________________________  Should you have questions after your visit to [facility], please contact our office at [phone] between the hours of 8:00 a.m. and 4:30 p.m.  Voicemails left after 4:00 p.m. will not be returned until the following business day.  For prescription refill requests, have your pharmacy contact our office and allow 72 hours.    Cancer Center Support Programs:   > Cancer Support Group  2nd Tuesday of the month 1pm-2pm, Journey Room

## 2018-10-04 ENCOUNTER — Other Ambulatory Visit: Payer: Self-pay | Admitting: Nurse Practitioner

## 2018-10-04 ENCOUNTER — Ambulatory Visit (HOSPITAL_COMMUNITY)
Admission: RE | Admit: 2018-10-04 | Discharge: 2018-10-04 | Disposition: A | Discharge status: Home / Self Care | Payer: Medicare Other | Source: Ambulatory Visit | Attending: Nurse Practitioner | Admitting: Nurse Practitioner

## 2018-10-04 DIAGNOSIS — K769 Liver disease, unspecified: Secondary | ICD-10-CM | POA: Diagnosis not present

## 2018-10-05 ENCOUNTER — Inpatient Hospital Stay (HOSPITAL_COMMUNITY): Payer: Medicare Other

## 2018-10-05 ENCOUNTER — Ambulatory Visit (HOSPITAL_COMMUNITY): Payer: Medicare Other

## 2018-10-05 VITALS — BP 130/52 | HR 68 | Temp 97.6°F | Resp 18

## 2018-10-05 DIAGNOSIS — E538 Deficiency of other specified B group vitamins: Secondary | ICD-10-CM

## 2018-10-05 MED ORDER — SODIUM CHLORIDE 0.9 % IV SOLN
510.0000 mg | Freq: Once | INTRAVENOUS | Status: AC
  Administered 2018-10-05: 510 mg via INTRAVENOUS
  Filled 2018-10-05: qty 17

## 2018-10-05 MED ORDER — SODIUM CHLORIDE 0.9 % IV SOLN
INTRAVENOUS | Status: DC
  Administered 2018-10-05: 12:00:00 via INTRAVENOUS

## 2018-10-05 NOTE — Patient Instructions (Signed)
[facility] at [facility]  Discharge Instructions:   _______________________________________________________________  Thank you for choosing [facility] at [facility] to provide your oncology and hematology care.  To afford each patient quality time with our providers, please arrive at least 15 minutes before your scheduled appointment.  You need to re-schedule your appointment if you arrive 10 or more minutes late.  We strive to give you quality time with our providers, and arriving late affects you and other patients whose appointments are after yours.  Also, if you no show three or more times for appointments you may be dismissed from the clinic.  Again, thank you for choosing [facility] at [facility]. Our hope is that these requests will allow you access to exceptional care and in a timely manner. _______________________________________________________________  If you have questions after your visit, please contact our office at [phone] between the hours of 8:30 a.m. and 5:00 p.m. Voicemails left after 4:30 p.m. will not be returned until the following business day. _______________________________________________________________  For prescription refill requests, have your pharmacy contact our office. _______________________________________________________________  Recommendations made by the consultant and any test results will be sent to your referring physician. _______________________________________________________________ 

## 2018-10-06 ENCOUNTER — Encounter: Payer: Self-pay | Admitting: Nurse Practitioner

## 2018-10-06 NOTE — Assessment & Plan Note (Signed)
The patient had grade 2 nonbleeding esophageal varices.  Initially recommended 1 year repeat EGD.  The patient was also seen by Alisha [name].  Based on a ESLD recommendations they recommended a sooner EGD then 1 year.  Patient is generally asymptomatic from a hepatic and GI standpoint.  No hematochezia or melena.  We will proceed with EGD with possible variceal band ligation shortly.  Follow-up in 6 months here.  Follow-up with Alisha [name] based on their recommendations.  Proceed with EGD +/- variceal band ligation with Dr. Gala Romney in near future: the risks, benefits, and alternatives have been discussed with the patient in detail. The patient states understanding and desires to proceed.  The patient is not on any anticoagulants, anxiolytics, chronic pain medications, or antidepressants.  Conscious sedation should be adequate for procedure

## 2018-10-06 NOTE — Assessment & Plan Note (Signed)
Patient had abnormal MRI liver which was repeated on 04/04/2018 and found cirrhosis with no new or suspicious liver masses.  Previously noted subcentimeter category 3 left liver lobe lesion has decreased in size and is probably benign.  Recommended MRI of the abdomen with and without contrast in 6 months for surveillance.  She is currently due.  At this point we will proceed with scheduling MRI as recommended.  This will also accomplish her necessary routine cirrhosis imaging.  Follow-up here in 6 months, follow-up with [facility] hepatology/transplant based on their recommendations.

## 2018-10-06 NOTE — Assessment & Plan Note (Signed)
History of likely [place] cirrhosis.  Generally well compensated.  She does not have any overt hepatic symptoms today.  She is up-to-date on labs which were recently drawn by [facility] transplant.  She is due for surveillance MRI based on abnormalities found, as per below.  At her last visit at [facility] transplant her meld score was 18 but this was generally driven by her creatinine with known chronic kidney disease.  They recommended follow-up at their service in 6 months.  Recommend she repeat labs at [facility], as they were just drawn.  We will accomplish imaging with necessary surveillance MRI.  EGD for variceal band ligation as needed as per below.  Follow-up here in 6 months.

## 2018-10-10 NOTE — Progress Notes (Signed)
CC'D TO PCP °

## 2018-10-14 NOTE — Progress Notes (Signed)
ON RECALL  °

## 2018-11-03 ENCOUNTER — Inpatient Hospital Stay (HOSPITAL_COMMUNITY): Payer: Medicare Other | Attending: Oncology

## 2018-11-03 ENCOUNTER — Encounter (HOSPITAL_COMMUNITY): Payer: Self-pay

## 2018-11-03 VITALS — BP 135/52 | HR 69 | Temp 97.2°F | Resp 18

## 2018-11-03 DIAGNOSIS — E538 Deficiency of other specified B group vitamins: Secondary | ICD-10-CM | POA: Insufficient documentation

## 2018-11-03 MED ORDER — CYANOCOBALAMIN 1000 MCG/ML IJ SOLN
1000.0000 ug | Freq: Once | INTRAMUSCULAR | Status: AC
  Administered 2018-11-03: 1000 ug via INTRAMUSCULAR

## 2018-11-03 NOTE — Progress Notes (Signed)
Alisha [name] tolerated Vit B12 injection well without complaints or incident. VSS Pt discharged self ambulatory in satisfactory condition accompanied by family member

## 2018-11-03 NOTE — Patient Instructions (Signed)
[facility] at [facility] Discharge Instructions  Received Vit B12 injection today. Follow-up as scheduled. Call clinic for any questions or concerns   Thank you for choosing [facility] at [facility] to provide your oncology and hematology care.  To afford each patient quality time with our provider, please arrive at least 15 minutes before your scheduled appointment time.   If you have a lab appointment with the Cancer Center please come in thru the  Main Entrance and check in at the main information desk  You need to re-schedule your appointment should you arrive 10 or more minutes late.  We strive to give you quality time with our providers, and arriving late affects you and other patients whose appointments are after yours.  Also, if you no show three or more times for appointments you may be dismissed from the clinic at the providers discretion.     Again, thank you for choosing [facility].  Our hope is that these requests will decrease the amount of time that you wait before being seen by our physicians.       _____________________________________________________________  Should you have questions after your visit to [facility], please contact our office at [phone] between the hours of 8:00 a.m. and 4:30 p.m.  Voicemails left after 4:00 p.m. will not be returned until the following business day.  For prescription refill requests, have your pharmacy contact our office and allow 72 hours.    Cancer Center Support Programs:   > Cancer Support Group  2nd Tuesday of the month 1pm-2pm, Journey Room   

## 2018-12-01 ENCOUNTER — Encounter: Payer: Self-pay | Admitting: Internal Medicine

## 2018-12-02 ENCOUNTER — Other Ambulatory Visit (HOSPITAL_COMMUNITY): Payer: Self-pay | Admitting: Nurse Practitioner

## 2018-12-02 ENCOUNTER — Inpatient Hospital Stay (HOSPITAL_COMMUNITY): Payer: Medicare Other | Attending: Oncology

## 2018-12-02 ENCOUNTER — Other Ambulatory Visit: Payer: Self-pay

## 2018-12-02 ENCOUNTER — Encounter (HOSPITAL_COMMUNITY): Payer: Self-pay

## 2018-12-02 VITALS — BP 129/54 | HR 72 | Temp 97.9°F | Resp 18

## 2018-12-02 DIAGNOSIS — E538 Deficiency of other specified B group vitamins: Secondary | ICD-10-CM | POA: Diagnosis not present

## 2018-12-02 DIAGNOSIS — Z79899 Other long term (current) drug therapy: Secondary | ICD-10-CM | POA: Diagnosis not present

## 2018-12-02 MED ORDER — CYANOCOBALAMIN 1000 MCG/ML IJ SOLN
1000.0000 ug | Freq: Once | INTRAMUSCULAR | Status: AC
  Administered 2018-12-02: 1000 ug via INTRAMUSCULAR
  Filled 2018-12-02: qty 1

## 2018-12-02 NOTE — Progress Notes (Signed)
B12 injection  given per orders. Patient tolerated it well without problems. Vitals stable and discharged home from clinic ambulatory. Follow up as scheduled.

## 2018-12-15 ENCOUNTER — Encounter (HOSPITAL_COMMUNITY): Payer: Self-pay | Admitting: Anesthesiology

## 2018-12-22 ENCOUNTER — Telehealth: Payer: Self-pay | Admitting: *Deleted

## 2018-12-22 NOTE — Telephone Encounter (Signed)
Patient called back and is r/s'd to 6/12 at 8:30am. Patient aware new instructions will be mailed. Endo is aware of appt change.

## 2018-12-22 NOTE — Telephone Encounter (Signed)
Called pt and LMTCB.

## 2018-12-28 ENCOUNTER — Other Ambulatory Visit: Payer: Self-pay

## 2018-12-28 ENCOUNTER — Inpatient Hospital Stay (HOSPITAL_COMMUNITY): Payer: Medicare Other | Attending: Oncology

## 2018-12-28 DIAGNOSIS — E785 Hyperlipidemia, unspecified: Secondary | ICD-10-CM | POA: Insufficient documentation

## 2018-12-28 DIAGNOSIS — K766 Portal hypertension: Secondary | ICD-10-CM | POA: Diagnosis not present

## 2018-12-28 DIAGNOSIS — K7460 Unspecified cirrhosis of liver: Secondary | ICD-10-CM | POA: Diagnosis not present

## 2018-12-28 DIAGNOSIS — Z79899 Other long term (current) drug therapy: Secondary | ICD-10-CM | POA: Diagnosis not present

## 2018-12-28 DIAGNOSIS — I129 Hypertensive chronic kidney disease with stage 1 through stage 4 chronic kidney disease, or unspecified chronic kidney disease: Secondary | ICD-10-CM | POA: Insufficient documentation

## 2018-12-28 DIAGNOSIS — Z7984 Long term (current) use of oral hypoglycemic drugs: Secondary | ICD-10-CM | POA: Diagnosis not present

## 2018-12-28 DIAGNOSIS — D696 Thrombocytopenia, unspecified: Secondary | ICD-10-CM | POA: Insufficient documentation

## 2018-12-28 DIAGNOSIS — D500 Iron deficiency anemia secondary to blood loss (chronic): Secondary | ICD-10-CM | POA: Insufficient documentation

## 2018-12-28 DIAGNOSIS — Z800 Family history of malignant neoplasm of digestive organs: Secondary | ICD-10-CM | POA: Diagnosis not present

## 2018-12-28 DIAGNOSIS — K219 Gastro-esophageal reflux disease without esophagitis: Secondary | ICD-10-CM | POA: Insufficient documentation

## 2018-12-28 DIAGNOSIS — K449 Diaphragmatic hernia without obstruction or gangrene: Secondary | ICD-10-CM | POA: Insufficient documentation

## 2018-12-28 DIAGNOSIS — E1122 Type 2 diabetes mellitus with diabetic chronic kidney disease: Secondary | ICD-10-CM | POA: Diagnosis not present

## 2018-12-28 DIAGNOSIS — E538 Deficiency of other specified B group vitamins: Secondary | ICD-10-CM | POA: Insufficient documentation

## 2018-12-28 LAB — CBC WITH DIFFERENTIAL/PLATELET
Abs Immature Granulocytes: 0 10*3/uL (ref 0.00–0.07)
Basophils Absolute: 0 10*3/uL (ref 0.0–0.1)
Basophils Relative: 1 %
Eosinophils Absolute: 0.1 10*3/uL (ref 0.0–0.5)
Eosinophils Relative: 3 %
HCT: 33.9 % — ABNORMAL LOW (ref 36.0–46.0)
Hemoglobin: 11.7 g/dL — ABNORMAL LOW (ref 12.0–15.0)
Immature Granulocytes: 0 %
Lymphocytes Relative: 26 %
Lymphs Abs: 1 10*3/uL (ref 0.7–4.0)
MCH: 30.5 pg (ref 26.0–34.0)
MCHC: 34.5 g/dL (ref 30.0–36.0)
MCV: 88.3 fL (ref 80.0–100.0)
Monocytes Absolute: 0.3 10*3/uL (ref 0.1–1.0)
Monocytes Relative: 8 %
Neutro Abs: 2.6 10*3/uL (ref 1.7–7.7)
Neutrophils Relative %: 62 %
Platelets: 48 10*3/uL — ABNORMAL LOW (ref 150–400)
RBC: 3.84 MIL/uL — ABNORMAL LOW (ref 3.87–5.11)
RDW: 14.5 % (ref 11.5–15.5)
WBC: 4.1 10*3/uL (ref 4.0–10.5)
nRBC: 0 % (ref 0.0–0.2)

## 2018-12-28 LAB — COMPREHENSIVE METABOLIC PANEL
ALT: 29 U/L (ref 0–44)
AST: 33 U/L (ref 15–41)
Albumin: 3.8 g/dL (ref 3.5–5.0)
Alkaline Phosphatase: 84 U/L (ref 38–126)
Anion gap: 6 (ref 5–15)
BUN: 16 mg/dL (ref 8–23)
CO2: 23 mmol/L (ref 22–32)
Calcium: 9.1 mg/dL (ref 8.9–10.3)
Chloride: 109 mmol/L (ref 98–111)
Creatinine, Ser: 1.79 mg/dL — ABNORMAL HIGH (ref 0.44–1.00)
GFR calc Af Amer: 33 mL/min — ABNORMAL LOW (ref >60)
GFR calc non Af Amer: 29 mL/min — ABNORMAL LOW (ref >60)
Glucose, Bld: 289 mg/dL — ABNORMAL HIGH (ref 70–99)
Potassium: 4.6 mmol/L (ref 3.5–5.1)
Sodium: 138 mmol/L (ref 135–145)
Total Bilirubin: 2.9 mg/dL — ABNORMAL HIGH (ref 0.3–1.2)
Total Protein: 7 g/dL (ref 6.5–8.1)

## 2018-12-28 LAB — FERRITIN: Ferritin: 336 ng/mL — ABNORMAL HIGH (ref 11–307)

## 2018-12-28 LAB — IRON AND TIBC
Iron: 101 ug/dL (ref 28–170)
Saturation Ratios: 39 % — ABNORMAL HIGH (ref 10.4–31.8)
TIBC: 257 ug/dL (ref 250–450)
UIBC: 156 ug/dL

## 2018-12-28 LAB — FOLATE: Folate: 11.3 ng/mL (ref >5.9)

## 2018-12-28 LAB — VITAMIN B12: Vitamin B-12: 877 pg/mL (ref 180–914)

## 2019-01-04 ENCOUNTER — Encounter (HOSPITAL_COMMUNITY): Payer: Self-pay | Admitting: Nurse Practitioner

## 2019-01-04 ENCOUNTER — Inpatient Hospital Stay (HOSPITAL_COMMUNITY): Payer: Medicare Other

## 2019-01-04 ENCOUNTER — Other Ambulatory Visit: Payer: Self-pay

## 2019-01-04 ENCOUNTER — Inpatient Hospital Stay (HOSPITAL_COMMUNITY): Payer: Medicare Other | Admitting: Nurse Practitioner

## 2019-01-04 VITALS — BP 148/67 | HR 78 | Temp 98.0°F | Resp 18 | Wt 188.0 lb

## 2019-01-04 DIAGNOSIS — K449 Diaphragmatic hernia without obstruction or gangrene: Secondary | ICD-10-CM

## 2019-01-04 DIAGNOSIS — D696 Thrombocytopenia, unspecified: Secondary | ICD-10-CM | POA: Diagnosis not present

## 2019-01-04 DIAGNOSIS — K766 Portal hypertension: Secondary | ICD-10-CM

## 2019-01-04 DIAGNOSIS — Z800 Family history of malignant neoplasm of digestive organs: Secondary | ICD-10-CM

## 2019-01-04 DIAGNOSIS — Z7984 Long term (current) use of oral hypoglycemic drugs: Secondary | ICD-10-CM

## 2019-01-04 DIAGNOSIS — D500 Iron deficiency anemia secondary to blood loss (chronic): Secondary | ICD-10-CM | POA: Diagnosis not present

## 2019-01-04 DIAGNOSIS — E538 Deficiency of other specified B group vitamins: Secondary | ICD-10-CM | POA: Diagnosis not present

## 2019-01-04 DIAGNOSIS — Z79899 Other long term (current) drug therapy: Secondary | ICD-10-CM

## 2019-01-04 DIAGNOSIS — I129 Hypertensive chronic kidney disease with stage 1 through stage 4 chronic kidney disease, or unspecified chronic kidney disease: Secondary | ICD-10-CM

## 2019-01-04 DIAGNOSIS — K7460 Unspecified cirrhosis of liver: Secondary | ICD-10-CM

## 2019-01-04 DIAGNOSIS — E785 Hyperlipidemia, unspecified: Secondary | ICD-10-CM

## 2019-01-04 DIAGNOSIS — K219 Gastro-esophageal reflux disease without esophagitis: Secondary | ICD-10-CM

## 2019-01-04 DIAGNOSIS — E1122 Type 2 diabetes mellitus with diabetic chronic kidney disease: Secondary | ICD-10-CM

## 2019-01-04 MED ORDER — CYANOCOBALAMIN 1000 MCG/ML IJ SOLN
1000.0000 ug | Freq: Once | INTRAMUSCULAR | Status: AC
  Administered 2019-01-04: 12:00:00 1000 ug via INTRAMUSCULAR
  Filled 2019-01-04: qty 1

## 2019-01-04 NOTE — Progress Notes (Signed)
Patient tolerated injection with no complaints voiced.  Site clean and dry with no bruising or swelling noted at site.  Band aid applied.  Vss with discharge and left ambulatory with no s/s of distress noted.  

## 2019-01-04 NOTE — Assessment & Plan Note (Addendum)
1.  Normocytic anemia: -This is from a combination of CKD, chronic blood loss, and iron deficiency. -Last Feraheme was on 12/24/2017 and 01/03/2018. - She had labs on 12/28/2018 which showed her ferritin 336, and her percent saturation 39.  Her hemoglobin is 11.7. -I do not recommend any iron infusions at this time. - She will follow-up in 3 months with repeat labs.  2.  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 - Platelet count is more or less stable and stays around 50. - Labs on 12/28/2018 showed a platelet count of 48.  3.  Elevated creatinine: - Labs drawn on 12/28/2018 showed creatinine of 1.79. - This is stable at this time we will continue to monitor.  4.  Cirrhosis: - She follows up with the liver transplant program at [facility]. -Her last MRI of the abdomen without contrast on 10/04/2018 showed she had cirrhosis and portal venous hypertension, without suspicious liver lesion.  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  5.  B12 deficiency: - Patient takes B12 injections monthly. - Labs on 12/28/2018 showed her B12 level at 877. - She will continue on her monthly injections and we will recheck her levels in 3 months.

## 2019-01-04 NOTE — Patient Instructions (Signed)
[facility] at [facility]  Discharge Instructions:   _______________________________________________________________  Thank you for choosing [facility] at [facility] to provide your oncology and hematology care.  To afford each patient quality time with our providers, please arrive at least 15 minutes before your scheduled appointment.  You need to re-schedule your appointment if you arrive 10 or more minutes late.  We strive to give you quality time with our providers, and arriving late affects you and other patients whose appointments are after yours.  Also, if you no show three or more times for appointments you may be dismissed from the clinic.  Again, thank you for choosing [facility]. Our hope is that these requests will allow you access to exceptional care and in a timely manner. _______________________________________________________________  If you have questions after your visit, please contact our office at [phone] between the hours of 8:30 a.m. and 5:00 p.m. Voicemails left after 4:30 p.m. will not be returned until the following business day. _______________________________________________________________  For prescription refill requests, have your pharmacy contact our office. _______________________________________________________________  Recommendations made by the consultant and any test results will be sent to your referring physician. _______________________________________________________________ 

## 2019-01-04 NOTE — Patient Instructions (Signed)
[facility] at [facility] Discharge Instructions  Follow up in 3 months with labs    Thank you for choosing [facility] at [facility] to provide your oncology and hematology care.  To afford each patient quality time with our provider, please arrive at least 15 minutes before your scheduled appointment time.   If you have a lab appointment with the Cancer Center please come in thru the  Main Entrance and check in at the main information desk  You need to re-schedule your appointment should you arrive 10 or more minutes late.  We strive to give you quality time with our providers, and arriving late affects you and other patients whose appointments are after yours.  Also, if you no show three or more times for appointments you may be dismissed from the clinic at the providers discretion.     Again, thank you for choosing [facility].  Our hope is that these requests will decrease the amount of time that you wait before being seen by our physicians.       _____________________________________________________________  Should you have questions after your visit to [facility], please contact our office at [phone] between the hours of 8:00 a.m. and 4:30 p.m.  Voicemails left after 4:00 p.m. will not be returned until the following business day.  For prescription refill requests, have your pharmacy contact our office and allow 72 hours.    Cancer Center Support Programs:   > Cancer Support Group  2nd Tuesday of the month 1pm-2pm, Journey Room    

## 2019-01-04 NOTE — Progress Notes (Signed)
[address] [place], [place] [address]   CLINIC:  Medical Oncology/Hematology  PCP:  Glenda Chroman, MD [address] [place] [address] [phone]   REASON FOR VISIT: Follow-up for normocytic anemia, thrombocytopenia, B 12 deficiency  CURRENT THERAPY: Intermittent iron infusions and monthly B12 injections   INTERVAL HISTORY:  Alisha [name] 68 y.o. female returns for routine follow-up for normocytic anemia.  She has been doing well since her last visit.  She has no complaints at this time besides her energy levels are decreased.  She denies any bleeding at this time.  She is trying to remain active at home. Denies any nausea, vomiting, or diarrhea. Denies any new pains. Had not noticed any recent bleeding such as epistaxis, hematuria or hematochezia. Denies recent chest pain on exertion, shortness of breath on minimal exertion, pre-syncopal episodes, or palpitations. Denies any numbness or tingling in hands or feet. Denies any recent fevers, infections, or recent hospitalizations. Patient reports appetite at 75% and energy level at 50%.  She is eating well and maintaining her weight at this time.   REVIEW OF SYSTEMS:  Review of Systems  Constitutional: Positive for fatigue.  All other systems reviewed and are negative.    PAST MEDICAL/SURGICAL HISTORY:  Past Medical History:  Diagnosis Date  . B12 deficiency 11/03/2016  . B12 deficiency 11/03/2016  . Barrett's esophagus   . Cirrhosis of liver without mention of alcohol    hep B surface antigen and HCV ab negative.pt has not had hepatitis A and B vaccines.U/S on 07/04/13 shows cirrhosis.  . Diverticula of colon    pancolonic  . Esophageal reflux   . Esophageal varices ([place])   . Hiatal hernia   . Iron deficiency anemia, unspecified   . Other and unspecified hyperlipidemia   . Portal hypertensive gastropathy ([place])   . Tubular adenoma   . Type II or unspecified type diabetes mellitus without mention of  complication, not stated as uncontrolled   . Unspecified essential hypertension   . Unspecified hemorrhoids without mention of complication    Past Surgical History:  Procedure Laterality Date  . ABDOMINAL HYSTERECTOMY    . BIOPSY  04/30/2016   Procedure: BIOPSY;  Surgeon: Daneil Dolin, MD;  [facility];  Service: Endoscopy;;  esophagus  . CESAREAN SECTION     x 2  . COLONOSCOPY  03/2006   left sided diverticula, splenic flexure tublar adenoma  . COLONOSCOPY  07/2002   villous tubular adenoma in rectum  . COLONOSCOPY  09/23/09   external hemorrhoids/scattered pan colonic diverticula otherwise normal. cecal lipoma bx negative, normal TI. Next TCS 09/2014  . COLONOSCOPY N/A 11/12/2014   Procedure: COLONOSCOPY;  Surgeon: Daneil Dolin, MD;  [facility];  Service: Endoscopy;  Laterality: N/A;  1215 - moved to 12:30 - Ginger to notify pt  . COLONOSCOPY N/A 01/07/2017   Procedure: COLONOSCOPY;  Surgeon: Daneil Dolin, MD;  [facility];  Service: Endoscopy;  Laterality: N/A;  3:15PM  . ESOPHAGEAL BANDING N/A 12/20/2016   Procedure: ESOPHAGEAL BANDING;  Surgeon: Danie Binder, MD;  [facility];  Service: Endoscopy;  Laterality: N/A;  . ESOPHAGEAL BANDING N/A 03/18/2017   Procedure: ESOPHAGEAL BANDING;  Surgeon: Daneil Dolin, MD;  [facility];  Service: Endoscopy;  Laterality: N/A;  . ESOPHAGEAL BANDING  12/17/2017   Procedure: ESOPHAGEAL BANDING;  Surgeon: Daneil Dolin, MD;  [facility];  Service: Endoscopy;;  . ESOPHAGOGASTRODUODENOSCOPY  08/2006   barretts esophagus, no dyplasia  . ESOPHAGOGASTRODUODENOSCOPY  03/2006   barretts esophagus,bx focal atypia c/w low grade dysplasia, SB bx negative for celiac  . ESOPHAGOGASTRODUODENOSCOPY  09/23/09   3 columns of grade 2 esophageal varices/hiatal hernia/3-4 cm segment Barrett's esophagus without dysplasia. Next EGD 09/2012  . ESOPHAGOGASTRODUODENOSCOPY N/A 11/11/2012   Dr.  Gala Romney- Barrett;s esophagus, esophageal varices, portal gastopathy. hiatal hernia  . ESOPHAGOGASTRODUODENOSCOPY N/A 04/30/2016   Procedure: ESOPHAGOGASTRODUODENOSCOPY (EGD);  Surgeon: Daneil Dolin, MD;  [facility];  Service: Endoscopy;  Laterality: N/A;  815  . ESOPHAGOGASTRODUODENOSCOPY N/A 12/20/2016   Procedure: ESOPHAGOGASTRODUODENOSCOPY (EGD);  Surgeon: Danie Binder, MD;  [facility];  Service: Endoscopy;  Laterality: N/A;  . ESOPHAGOGASTRODUODENOSCOPY N/A 01/07/2017   Procedure: ESOPHAGOGASTRODUODENOSCOPY (EGD);  Surgeon: Daneil Dolin, MD;  [facility];  Service: Endoscopy;  Laterality: N/A;  . ESOPHAGOGASTRODUODENOSCOPY N/A 03/18/2017   Procedure: ESOPHAGOGASTRODUODENOSCOPY (EGD);  Surgeon: Daneil Dolin, MD;  [facility];  Service: Endoscopy;  Laterality: N/A;  1030   . ESOPHAGOGASTRODUODENOSCOPY N/A 12/17/2017   Procedure: ESOPHAGOGASTRODUODENOSCOPY (EGD);  Surgeon: Daneil Dolin, MD;  [facility];  Service: Endoscopy;  Laterality: N/A;  10:30am  . HEMORRHOID SURGERY  2008  . small bowel capsule endoscopy  10/2009   normal     SOCIAL HISTORY:  Social History   Socioeconomic History  . Marital status: Married    Spouse name: Not on file  . Number of children: Not on file  . Years of education: Not on file  . Highest education level: Not on file  Occupational History  . Not on file  Social Needs  . Financial resource strain: Not on file  . Food insecurity:    Worry: Not on file    Inability: Not on file  . Transportation needs:    Medical: Not on file    Non-medical: Not on file  Tobacco Use  . Smoking status: Never Smoker  . Smokeless tobacco: Never Used  Substance and Sexual Activity  . Alcohol use: No  . Drug use: No  . Sexual activity: Yes  Lifestyle  . Physical activity:    Days per week: Not on file    Minutes per session: Not on file  . Stress: Not on file  Relationships  . Social  connections:    Talks on phone: Not on file    Gets together: Not on file    Attends religious service: Not on file    Active member of club or organization: Not on file    Attends meetings of clubs or organizations: Not on file    Relationship status: Not on file  . Intimate partner violence:    Fear of current or ex partner: Not on file    Emotionally abused: Not on file    Physically abused: Not on file    Forced sexual activity: Not on file  Other Topics Concern  . Not on file  Social History Narrative  . Not on file    FAMILY HISTORY:  Family History  Problem Relation Age of Onset  . Alzheimer's disease Mother   . CAD Father   . Diabetes Mellitus II Father   . Alzheimer's disease Father   . Diabetes Mellitus II Sister   . Colon cancer Sister 77       small, surgical excision; chemo/rad not needed  . Cancer - Other Brother   .  Diabetes Mellitus II Brother   . Hypertension Brother   . Liver disease Neg Hx     CURRENT MEDICATIONS:  Outpatient Encounter Medications as of 01/04/2019  Medication Sig Note  . ACCU-CHEK SOFTCLIX LANCETS lancets USE TO CHECK BLOOD SUGAR TWICE DAILY.   Marland Kitchen Cholecalciferol (VITAMIN D) 2000 units tablet Take 2,000 Units by mouth daily.   . cyanocobalamin (,VITAMIN B-12,) 1000 MCG/ML injection Inject 1,000 mcg into the muscle every 30 (thirty) days.  12/13/2018: LAST DOSE:12/07/2018  . GLIPIZIDE XL 10 MG 24 hr tablet Take 10 mg by mouth daily.    Marland Kitchen lisinopril (PRINIVIL,ZESTRIL) 10 MG tablet Take 10 mg by mouth daily.    Marland Kitchen NOVOFINE 32G X 6 MM MISC USE ONE AS DIRECTED TWICE DAILY.   . pantoprazole (PROTONIX) 40 MG tablet Take 1 tablet (40 mg total) by mouth 2 (two) times daily before a meal.   . propranolol (INDERAL) 10 MG tablet TAKE THREE (3) TABLETS BY MOUTH EVERY MORNING AND 3 TABLETS EVERY EVENING. (Patient taking differently: Take 30 mg by mouth 2 (two) times daily. )   . TRESIBA FLEXTOUCH 200 UNIT/ML SOPN Inject 26-44 Units into the skin at  bedtime. Uses sliding scale once a day   . VICTOZA 18 MG/3ML SOLN Inject 1.8 mg into the skin daily.    Marland Kitchen ketorolac (ACULAR) 0.5 % ophthalmic solution Place 1 drop into both eyes 4 (four) times daily.    Marland Kitchen ofloxacin (OCUFLOX) 0.3 % ophthalmic solution INSTILL ONE DROP IN EACH EYE FOUR TIMES DAILY. START 72 HOURS PRIOR TO SURGERY. 12/13/2018: On hold until procedure is rescheduled   . prednisoLONE acetate (PRED FORTE) 1 % ophthalmic suspension INSTILL ONE DROP FOUR TIMES DAILY. START AFTER SURGERY. 12/13/2018: On hold until procedure is rescheduled   Facility-Administered Encounter Medications as of 01/04/2019  Medication  . cyanocobalamin ((VITAMIN B-12)) injection 1,000 mcg    ALLERGIES:  Allergies  Allergen Reactions  . Codeine Nausea And Vomiting     PHYSICAL EXAM:  ECOG Performance status: 1  Vitals:   01/04/19 1131  BP: (!) 148/67  Pulse: 78  Resp: 18  Temp: 98 F (36.7 C)  SpO2: 95%   Filed Weights   01/04/19 1131  Weight: 188 lb (85.3 kg)    Physical Exam Constitutional:      Appearance: Normal appearance. She is normal weight.  Cardiovascular:     Rate and Rhythm: Normal rate and regular rhythm.     Heart sounds: Normal heart sounds.  Pulmonary:     Effort: Pulmonary effort is normal.     Breath sounds: Normal breath sounds.  Abdominal:     General: Bowel sounds are normal.     Palpations: Abdomen is soft.  Musculoskeletal: Normal range of motion.  Skin:    General: Skin is warm and dry.  Neurological:     Mental Status: She is alert and oriented to person, place, and time. Mental status is at baseline.  Psychiatric:        Mood and Affect: Mood normal.        Behavior: Behavior normal.        Thought Content: Thought content normal.        Judgment: Judgment normal.      LABORATORY DATA:  I have reviewed the labs as listed.  CBC    Component Value Date/Time   WBC 4.1 12/28/2018 1241   RBC 3.84 (L) 12/28/2018 1241   HGB 11.7 (L) 12/28/2018 1241    HCT 33.9 (  L) 12/28/2018 1241   PLT 48 (L) 12/28/2018 1241   MCV 88.3 12/28/2018 1241   MCH 30.5 12/28/2018 1241   MCHC 34.5 12/28/2018 1241   RDW 14.5 12/28/2018 1241   LYMPHSABS 1.0 12/28/2018 1241   MONOABS 0.3 12/28/2018 1241   EOSABS 0.1 12/28/2018 1241   BASOSABS 0.0 12/28/2018 1241   CMP Latest Ref Rng & Units 12/28/2018 09/28/2018 09/01/2018  Glucose 70 - 99 mg/dL 289(H) 189(H) 105(H)  BUN 8 - 23 mg/dL 16 16 17   Creatinine 0.44 - 1.00 mg/dL 1.79(H) 1.70(H) 1.84(H)  Sodium 135 - 145 mmol/L 138 139 140  Potassium 3.5 - 5.1 mmol/L 4.6 4.1 4.2  Chloride 98 - 111 mmol/L 109 107 113(H)  CO2 22 - 32 mmol/L 23 26 23   Calcium 8.9 - 10.3 mg/dL 9.1 8.9 9.0  Total Protein 6.5 - 8.1 g/dL 7.0 6.7 -  Total Bilirubin 0.3 - 1.2 mg/dL 2.9(H) 2.9(H) -  Alkaline Phos 38 - 126 U/L 84 60 -  AST 15 - 41 U/L 33 27 -  ALT 0 - 44 U/L 29 18 -     I personally performed a face-to-face visit.  All questions were answered to patient's stated satisfaction. Encouraged patient to call with any new concerns or questions before his next visit to the cancer center and we can certain see him sooner, if needed.      ASSESSMENT & PLAN:   Iron deficiency anemia 1.  Normocytic anemia: -This is from a combination of CKD, chronic blood loss, and iron deficiency. -Last Feraheme was on 12/24/2017 and 01/03/2018. - She had labs on 12/28/2018 which showed her ferritin 336, and her percent saturation 39.  Her hemoglobin is 11.7. -I do not recommend any iron infusions at this time. - She will follow-up in 3 months with repeat labs.  2.  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 - Platelet count is more or less stable and stays around 50. - Labs on 12/28/2018 showed a platelet count of 48.  3.  Elevated creatinine: - Labs drawn on 12/28/2018 showed creatinine of 1.79. - This is stable at this time we will continue to monitor.  4.  Cirrhosis: - She follows up with the liver  transplant program at [facility]. -Her last MRI of the abdomen without contrast on 10/04/2018 showed she had cirrhosis and portal venous hypertension, without suspicious liver lesion.  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  5.  B12 deficiency: - Patient takes B12 injections monthly. - Labs on 12/28/2018 showed her B12 level at 877. - She will continue on her monthly injections and we will recheck her levels in 3 months.      Orders placed this encounter:  Orders Placed This Encounter  Procedures  . Lactate dehydrogenase  . CBC with Differential/Platelet  . Comprehensive metabolic panel  . Ferritin  . Iron and TIBC  . Vitamin B12  . VITAMIN D 25 Hydroxy (Vit-D Deficiency, Fractures)  . Folate  . [place], [place] [phone]

## 2019-02-03 ENCOUNTER — Inpatient Hospital Stay (HOSPITAL_COMMUNITY): Payer: Medicare Other | Attending: Oncology

## 2019-02-03 ENCOUNTER — Other Ambulatory Visit: Payer: Self-pay

## 2019-02-03 ENCOUNTER — Encounter (HOSPITAL_COMMUNITY): Payer: Self-pay

## 2019-02-03 VITALS — BP 136/57 | HR 69 | Temp 98.1°F | Resp 18

## 2019-02-03 DIAGNOSIS — Z79899 Other long term (current) drug therapy: Secondary | ICD-10-CM | POA: Diagnosis not present

## 2019-02-03 DIAGNOSIS — E538 Deficiency of other specified B group vitamins: Secondary | ICD-10-CM | POA: Diagnosis present

## 2019-02-03 MED ORDER — CYANOCOBALAMIN 1000 MCG/ML IJ SOLN
1000.0000 ug | Freq: Once | INTRAMUSCULAR | Status: AC
  Administered 2019-02-03: 1000 ug via INTRAMUSCULAR

## 2019-02-03 NOTE — Progress Notes (Signed)
Alisha [name] tolerated Vit B12 injection well without complaints or incident. VSS Pt discharged self ambulatory in satisfactory condition

## 2019-02-03 NOTE — Patient Instructions (Signed)
[facility] at [facility] Discharge Instructions  Received Vit B12 injection today. Follow-up as scheduled. Call clinic for any questions or concerns   Thank you for choosing  [facility] Center at [facility] to provide your oncology and hematology care.  To afford each patient quality time with our provider, please arrive at least 15 minutes before your scheduled appointment time.   If you have a lab appointment with the Cancer Center please come in thru the  Main Entrance and check in at the main information desk  You need to re-schedule your appointment should you arrive 10 or more minutes late.  We strive to give you quality time with our providers, and arriving late affects you and other patients whose appointments are after yours.  Also, if you no show three or more times for appointments you may be dismissed from the clinic at the providers discretion.     Again, thank you for choosing [facility].  Our hope is that these requests will decrease the amount of time that you wait before being seen by our physicians.       _____________________________________________________________  Should you have questions after your visit to [facility], please contact our office at [phone] between the hours of 8:00 a.m. and 4:30 p.m.  Voicemails left after 4:00 p.m. will not be returned until the following business day.  For prescription refill requests, have your pharmacy contact our office and allow 72 hours.    Cancer Center Support Programs:   > Cancer Support Group  2nd Tuesday of the month 1pm-2pm, Journey Room   

## 2019-02-13 ENCOUNTER — Telehealth: Payer: Self-pay | Admitting: *Deleted

## 2019-02-13 NOTE — Telephone Encounter (Signed)
Called pt to inform her about COVID 19 screening prior to procedure.  Pt is scheduled for 02/21/2019.  Pt is aware to remain in quarantine once testing is done.  Pt voiced understanding.

## 2019-02-21 ENCOUNTER — Other Ambulatory Visit (HOSPITAL_COMMUNITY)
Admission: RE | Admit: 2019-02-21 | Discharge: 2019-02-21 | Disposition: A | Discharge status: Home / Self Care | Payer: Medicare Other | Source: Ambulatory Visit | Attending: Internal Medicine | Admitting: Internal Medicine

## 2019-02-21 ENCOUNTER — Other Ambulatory Visit: Payer: Self-pay

## 2019-02-21 DIAGNOSIS — Z01812 Encounter for preprocedural laboratory examination: Secondary | ICD-10-CM | POA: Insufficient documentation

## 2019-02-21 DIAGNOSIS — Z1159 Encounter for screening for other viral diseases: Secondary | ICD-10-CM | POA: Diagnosis not present

## 2019-02-22 LAB — NOVEL CORONAVIRUS, NAA (HOSP ORDER, SEND-OUT TO REF LAB; TAT 18-24 HRS): SARS-CoV-2, NAA: NOT DETECTED

## 2019-02-24 ENCOUNTER — Encounter (HOSPITAL_COMMUNITY)
Admission: RE | Disposition: A | Discharge status: Home / Self Care | Payer: Self-pay | Source: Home / Self Care | Attending: Internal Medicine

## 2019-02-24 ENCOUNTER — Ambulatory Visit (HOSPITAL_COMMUNITY)
Admission: RE | Admit: 2019-02-24 | Discharge: 2019-02-24 | Disposition: A | Discharge status: Home / Self Care | Payer: Medicare Other | Attending: Internal Medicine | Admitting: Internal Medicine

## 2019-02-24 ENCOUNTER — Other Ambulatory Visit: Payer: Self-pay

## 2019-02-24 ENCOUNTER — Encounter (HOSPITAL_COMMUNITY): Payer: Self-pay

## 2019-02-24 DIAGNOSIS — Z79899 Other long term (current) drug therapy: Secondary | ICD-10-CM | POA: Insufficient documentation

## 2019-02-24 DIAGNOSIS — Z809 Family history of malignant neoplasm, unspecified: Secondary | ICD-10-CM | POA: Insufficient documentation

## 2019-02-24 DIAGNOSIS — Z8249 Family history of ischemic heart disease and other diseases of the circulatory system: Secondary | ICD-10-CM | POA: Diagnosis not present

## 2019-02-24 DIAGNOSIS — Z86010 Personal history of colonic polyps: Secondary | ICD-10-CM | POA: Insufficient documentation

## 2019-02-24 DIAGNOSIS — K449 Diaphragmatic hernia without obstruction or gangrene: Secondary | ICD-10-CM | POA: Diagnosis not present

## 2019-02-24 DIAGNOSIS — Z13810 Encounter for screening for upper gastrointestinal disorder: Secondary | ICD-10-CM

## 2019-02-24 DIAGNOSIS — K3189 Other diseases of stomach and duodenum: Secondary | ICD-10-CM | POA: Diagnosis not present

## 2019-02-24 DIAGNOSIS — Z885 Allergy status to narcotic agent status: Secondary | ICD-10-CM | POA: Diagnosis not present

## 2019-02-24 DIAGNOSIS — Z833 Family history of diabetes mellitus: Secondary | ICD-10-CM | POA: Insufficient documentation

## 2019-02-24 DIAGNOSIS — Z8719 Personal history of other diseases of the digestive system: Secondary | ICD-10-CM | POA: Diagnosis not present

## 2019-02-24 DIAGNOSIS — Z794 Long term (current) use of insulin: Secondary | ICD-10-CM | POA: Insufficient documentation

## 2019-02-24 DIAGNOSIS — K219 Gastro-esophageal reflux disease without esophagitis: Secondary | ICD-10-CM | POA: Insufficient documentation

## 2019-02-24 DIAGNOSIS — Z90710 Acquired absence of both cervix and uterus: Secondary | ICD-10-CM | POA: Diagnosis not present

## 2019-02-24 DIAGNOSIS — E785 Hyperlipidemia, unspecified: Secondary | ICD-10-CM | POA: Insufficient documentation

## 2019-02-24 DIAGNOSIS — K766 Portal hypertension: Secondary | ICD-10-CM | POA: Diagnosis not present

## 2019-02-24 DIAGNOSIS — Z791 Long term (current) use of non-steroidal anti-inflammatories (NSAID): Secondary | ICD-10-CM | POA: Diagnosis not present

## 2019-02-24 DIAGNOSIS — Z800 Family history of malignant neoplasm of digestive organs: Secondary | ICD-10-CM | POA: Diagnosis not present

## 2019-02-24 DIAGNOSIS — K7460 Unspecified cirrhosis of liver: Secondary | ICD-10-CM | POA: Insufficient documentation

## 2019-02-24 DIAGNOSIS — K7469 Other cirrhosis of liver: Secondary | ICD-10-CM

## 2019-02-24 DIAGNOSIS — I8500 Esophageal varices without bleeding: Secondary | ICD-10-CM | POA: Diagnosis present

## 2019-02-24 DIAGNOSIS — E119 Type 2 diabetes mellitus without complications: Secondary | ICD-10-CM | POA: Insufficient documentation

## 2019-02-24 DIAGNOSIS — K2270 Barrett's esophagus without dysplasia: Secondary | ICD-10-CM | POA: Insufficient documentation

## 2019-02-24 HISTORY — PX: ESOPHAGOGASTRODUODENOSCOPY: SHX5428

## 2019-02-24 HISTORY — PX: BIOPSY: SHX5522

## 2019-02-24 LAB — GLUCOSE, CAPILLARY: Glucose-Capillary: 210 mg/dL — ABNORMAL HIGH (ref 70–99)

## 2019-02-24 SURGERY — EGD (ESOPHAGOGASTRODUODENOSCOPY)
Anesthesia: Moderate Sedation

## 2019-02-24 MED ORDER — LIDOCAINE VISCOUS HCL 2 % MT SOLN
OROMUCOSAL | Status: AC
  Filled 2019-02-24: qty 15

## 2019-02-24 MED ORDER — MIDAZOLAM HCL 5 MG/5ML IJ SOLN
INTRAMUSCULAR | Status: AC
  Filled 2019-02-24: qty 10

## 2019-02-24 MED ORDER — MIDAZOLAM HCL 5 MG/5ML IJ SOLN
INTRAMUSCULAR | Status: DC | PRN
  Administered 2019-02-24: 1 mg via INTRAVENOUS
  Administered 2019-02-24: 2 mg via INTRAVENOUS

## 2019-02-24 MED ORDER — LIDOCAINE VISCOUS HCL 2 % MT SOLN
OROMUCOSAL | Status: DC | PRN
  Administered 2019-02-24: 1 via OROMUCOSAL

## 2019-02-24 MED ORDER — ONDANSETRON HCL 4 MG/2ML IJ SOLN
INTRAMUSCULAR | Status: AC
  Filled 2019-02-24: qty 2

## 2019-02-24 MED ORDER — ONDANSETRON HCL 4 MG/2ML IJ SOLN
INTRAMUSCULAR | Status: DC | PRN
  Administered 2019-02-24: 4 mg via INTRAVENOUS

## 2019-02-24 MED ORDER — STERILE WATER FOR IRRIGATION IR SOLN
Status: DC | PRN
  Administered 2019-02-24: 2.5 mL

## 2019-02-24 MED ORDER — MEPERIDINE HCL 100 MG/ML IJ SOLN
INTRAMUSCULAR | Status: DC | PRN
  Administered 2019-02-24: 25 mg via INTRAVENOUS

## 2019-02-24 MED ORDER — SODIUM CHLORIDE 0.9 % IV SOLN
INTRAVENOUS | Status: DC
  Administered 2019-02-24: 08:00:00 via INTRAVENOUS

## 2019-02-24 MED ORDER — MEPERIDINE HCL 50 MG/ML IJ SOLN
INTRAMUSCULAR | Status: AC
  Filled 2019-02-24: qty 1

## 2019-02-24 NOTE — Op Note (Signed)
[facility] Patient Name: Alisha [name] Procedure Date: 02/24/2019 8:03 AM MRN: [id number] Date of Birth: Apr 16, 1951 Attending MD: Norvel Richards , MD CSN: [id number] Age: 68 Admit Type: Outpatient Procedure:                Upper GI endoscopy Indications:              Screening procedure, Surveillance procedure Providers:                Norvel Richards, MD, Lurline Del, RN, Raphael Gibney, Technician Referring MD:              Medicines:                Midazolam 4 mg IV, Meperidine 25 mg IV, Ondansetron                            4 mg IV Complications:            No immediate complications. Estimated Blood Loss:     Estimated blood loss was minimal. Procedure:                Pre-Anesthesia Assessment:                           - Prior to the procedure, a History and Physical                            was performed, and patient medications and                            allergies were reviewed. The patient's tolerance of                            previous anesthesia was also reviewed. The risks                            and benefits of the procedure and the sedation                            options and risks were discussed with the patient.                            All questions were answered, and informed consent                            was obtained. Prior Anticoagulants: The patient has                            taken no previous anticoagulant or antiplatelet                            agents. ASA Grade Assessment: II - A patient with  mild systemic disease. After reviewing the risks                            and benefits, the patient was deemed in                            satisfactory condition to undergo the procedure.                           After obtaining informed consent, the endoscope was                            passed under direct vision. Throughout the   procedure, the patient's blood pressure, pulse, and                            oxygen saturations were monitored continuously. The                            [id number]-H190 ([id number]) scope was introduced through the                            mouth, and advanced to the second part of duodenum.                            The upper GI endoscopy was accomplished without                            difficulty. The patient tolerated the procedure                            well. Scope In: 8:27:22 AM Scope Out: 8:33:14 AM Total Procedure Duration: 0 hours 5 minutes 52 seconds  Findings:      Several areas of scar in the esophagus consistent with prior band       placement. No varices seen. 5 cm segment of salmon-colored epithelium       coming up from the GE junction consistent with Barrett's esophagus. No       nodularity no esophagitis. Portal gastropathy. Small hiatal hernia.       Normal first and second portion of the duodenum. Salmon-colored       epithelium in the esophagus biopsied. Impression:               -Currently, varices eradicated. Barrett's esophagus                            status post biopsy. Portal gastropathy. Moderate Sedation:      Moderate (conscious) sedation was administered by the endoscopy nurse       and supervised by the endoscopist. The following parameters were       monitored: oxygen saturation, heart rate, blood pressure, respiratory       rate, EKG, adequacy of pulmonary ventilation, and response to care.       Total physician intraservice time was 18 minutes. Recommendation:           - Patient has a contact number available for  emergencies. The signs and symptoms of potential                            delayed complications were discussed with the                            patient. Return to normal activities tomorrow.                            Written discharge instructions were provided to the                            patient.                            - Advance diet as tolerated.                           - Continue present medications (including Protonix                            and propranolol).                           -                           - Repeat upper endoscopy in 1.5 years for                            surveillance.                           - Return to GI clinic (date not yet determined). Procedure Code(s):        --- Professional ---                           [phone], Esophagogastroduodenoscopy, flexible,                            transoral; diagnostic, including collection of                            specimen(s) by brushing or washing, when performed                            (separate procedure)                           G0500, Moderate sedation services provided by the                            same physician or other qualified health care                            professional performing a gastrointestinal  endoscopic service that sedation supports,                            requiring the presence of an independent trained                            observer to assist in the monitoring of the                            patient's level of consciousness and physiological                            status; initial 15 minutes of intra-service time;                            patient age 42 years or older (additional time may                            be reported with [phone], as appropriate) Diagnosis Code(s):        --- Professional ---                           Z13.810, Encounter for screening for upper                            gastrointestinal disorder CPT copyright 2019 [employer]. All rights reserved. The codes documented in this report are preliminary and upon coder review may  be revised to meet current compliance requirements. Cristopher Estimable. Tonae Livolsi, MD Norvel Richards, MD 02/24/2019 8:47:41 AM This report has been signed  electronically. Number of Addenda: 0

## 2019-02-24 NOTE — H&P (Signed)
@LOGO @   Primary Care Physician:  Glenda Chroman, MD Primary Gastroenterologist:  Dr. Gala Romney  Pre-Procedure History & Physical: HPI:  Alisha [name] is a 68 y.o. female here for follow-up for somewhat recalcitrant esophageal varices; status post multiple banding sessions previously. Patient states she is now had the hepatitis A and B vaccines.     Past Medical History:  Diagnosis Date  . B12 deficiency 11/03/2016  . B12 deficiency 11/03/2016  . Barrett's esophagus   . Cirrhosis of liver without mention of alcohol    hep B surface antigen and HCV ab negative.pt has not had hepatitis A and B vaccines.U/S on 07/04/13 shows cirrhosis.  . Diverticula of colon    pancolonic  . Esophageal reflux   . Esophageal varices ([place])   . Hiatal hernia   . Iron deficiency anemia, unspecified   . Other and unspecified hyperlipidemia   . Portal hypertensive gastropathy ([place])   . Tubular adenoma   . Type II or unspecified type diabetes mellitus without mention of complication, not stated as uncontrolled   . Unspecified essential hypertension   . Unspecified hemorrhoids without mention of complication     Past Surgical History:  Procedure Laterality Date  . ABDOMINAL HYSTERECTOMY    . BIOPSY  04/30/2016   Procedure: BIOPSY;  Surgeon: Daneil Dolin, MD;  [facility];  Service: Endoscopy;;  esophagus  . CESAREAN SECTION     x 2  . COLONOSCOPY  03/2006   left sided diverticula, splenic flexure tublar adenoma  . COLONOSCOPY  07/2002   villous tubular adenoma in rectum  . COLONOSCOPY  09/23/09   external hemorrhoids/scattered pan colonic diverticula otherwise normal. cecal lipoma bx negative, normal TI. Next TCS 09/2014  . COLONOSCOPY N/A 11/12/2014   Procedure: COLONOSCOPY;  Surgeon: Daneil Dolin, MD;  [facility];  Service: Endoscopy;  Laterality: N/A;  1215 - moved to 12:30 - Ginger to notify pt  . COLONOSCOPY N/A 01/07/2017   Procedure: COLONOSCOPY;  Surgeon: Daneil Dolin, MD;  [facility];  Service: Endoscopy;  Laterality: N/A;  3:15PM  . ESOPHAGEAL BANDING N/A 12/20/2016   Procedure: ESOPHAGEAL BANDING;  Surgeon: Danie Binder, MD;  [facility];  Service: Endoscopy;  Laterality: N/A;  . ESOPHAGEAL BANDING N/A 03/18/2017   Procedure: ESOPHAGEAL BANDING;  Surgeon: Daneil Dolin, MD;  [facility];  Service: Endoscopy;  Laterality: N/A;  . ESOPHAGEAL BANDING  12/17/2017   Procedure: ESOPHAGEAL BANDING;  Surgeon: Daneil Dolin, MD;  [facility];  Service: Endoscopy;;  . ESOPHAGOGASTRODUODENOSCOPY  08/2006   barretts esophagus, no dyplasia  . ESOPHAGOGASTRODUODENOSCOPY  03/2006   barretts esophagus,bx focal atypia c/w low grade dysplasia, SB bx negative for celiac  . ESOPHAGOGASTRODUODENOSCOPY  09/23/09   3 columns of grade 2 esophageal varices/hiatal hernia/3-4 cm segment Barrett's esophagus without dysplasia. Next EGD 09/2012  . ESOPHAGOGASTRODUODENOSCOPY N/A 11/11/2012   Dr. Gala Romney- Barrett;s esophagus, esophageal varices, portal gastopathy. hiatal hernia  . ESOPHAGOGASTRODUODENOSCOPY N/A 04/30/2016   Procedure: ESOPHAGOGASTRODUODENOSCOPY (EGD);  Surgeon: Daneil Dolin, MD;  [facility];  Service: Endoscopy;  Laterality: N/A;  815  . ESOPHAGOGASTRODUODENOSCOPY N/A 12/20/2016   Procedure: ESOPHAGOGASTRODUODENOSCOPY (EGD);  Surgeon: Danie Binder, MD;  [facility];  Service: Endoscopy;  Laterality: N/A;  . ESOPHAGOGASTRODUODENOSCOPY N/A 01/07/2017   Procedure: ESOPHAGOGASTRODUODENOSCOPY (EGD);  Surgeon: Daneil Dolin, MD;  [facility];  Service: Endoscopy;  Laterality: N/A;  .  ESOPHAGOGASTRODUODENOSCOPY N/A 03/18/2017   Procedure: ESOPHAGOGASTRODUODENOSCOPY (EGD);  Surgeon: Daneil Dolin, MD;  [facility];  Service: Endoscopy;  Laterality: N/A;  1030   . ESOPHAGOGASTRODUODENOSCOPY N/A 12/17/2017   Procedure: ESOPHAGOGASTRODUODENOSCOPY (EGD);  Surgeon: Daneil Dolin, MD;  [facility];  Service: Endoscopy;  Laterality: N/A;  10:30am  . HEMORRHOID SURGERY  2008  . small bowel capsule endoscopy  10/2009   normal    Prior to Admission medications   Medication Sig Start Date End Date Taking? Authorizing Provider  ACCU-CHEK AVIVA PLUS test strip USE ONE STRIP TWICE DAILY 01/23/19  Yes [provider]  ACCU-CHEK SOFTCLIX LANCETS lancets USE TO CHECK BLOOD SUGAR TWICE DAILY. 10/05/18  Yes [provider]  Cholecalciferol (VITAMIN D) 2000 units tablet Take 2,000 Units by mouth daily.   Yes [provider]  cyanocobalamin (,VITAMIN B-12,) 1000 MCG/ML injection Inject 1,000 mcg into the muscle every 30 (thirty) days.    Yes [provider]  GLIPIZIDE XL 10 MG 24 hr tablet Take 10 mg by mouth daily.  02/24/12  Yes [provider]  ketorolac (ACULAR) 0.5 % ophthalmic solution Place 1 drop into both eyes 4 (four) times daily.  10/24/18  Yes [provider]  lisinopril (PRINIVIL,ZESTRIL) 10 MG tablet Take 10 mg by mouth daily.  04/09/12  Yes [provider]  NOVOFINE 32G X 6 MM MISC USE ONE AS DIRECTED TWICE DAILY. 09/15/18  Yes [provider]  NOVOTWIST 32G X 5 MM MISC USE ONE AS DIRECTED TWICE DAILY. 11/15/18  Yes [provider]  ofloxacin (OCUFLOX) 0.3 % ophthalmic solution INSTILL ONE DROP IN EACH EYE FOUR TIMES DAILY. START 72 HOURS PRIOR TO SURGERY. 10/24/18  Yes [provider]  pantoprazole (PROTONIX) 40 MG tablet Take 1 tablet (40 mg total) by mouth 2 (two) times daily before a meal. 12/24/16  Yes Orvan Falconer, MD  prednisoLONE acetate (PRED FORTE) 1 % ophthalmic suspension INSTILL ONE DROP FOUR TIMES DAILY. START AFTER SURGERY. 10/24/18  Yes [provider]  propranolol (INDERAL) 10 MG tablet TAKE THREE (3) TABLETS BY MOUTH EVERY MORNING AND 3 TABLETS EVERY EVENING. Patient taking differently: Take 30 mg by mouth 2 (two) times daily.  03/11/18  Yes Mahala Menghini,  PA-C  TRESIBA FLEXTOUCH 200 UNIT/ML SOPN Inject 26-44 Units into the skin at bedtime. Uses sliding scale once a day 04/08/17  Yes [provider]  VICTOZA 18 MG/3ML SOLN Inject 1.8 mg into the skin daily.  04/15/12  Yes [provider]    Allergies as of 09/28/2018 - Review Complete 09/28/2018  Allergen Reaction Noted  . Codeine Nausea And Vomiting 04/19/2012    Family History  Problem Relation Age of Onset  . Alzheimer's disease Mother   . CAD Father   . Diabetes Mellitus II Father   . Alzheimer's disease Father   . Diabetes Mellitus II Sister   . Colon cancer Sister 48       small, surgical excision; chemo/rad not needed  . Cancer - Other Brother   . Diabetes Mellitus II Brother   . Hypertension Brother   . Liver disease Neg Hx     Social History   Socioeconomic History  . Marital status: Married    Spouse name: Not on file  . Number of children: Not on file  . Years of education: Not on file  . Highest education level: Not on file  Occupational History  . Not on file  Social Needs  . Financial resource strain: Not on file  . Food insecurity    Worry: Not on file    Inability: Not on file  . Transportation needs    Medical: Not on file    Non-medical: Not on file  Tobacco Use  . Smoking status: Never Smoker  . Smokeless tobacco: Never Used  Substance and Sexual Activity  . Alcohol use: No  . Drug use: No  . Sexual activity: Yes  Lifestyle  . Physical activity    Days per week: Not on file    Minutes per session: Not on file  . Stress: Not on file  Relationships  . Social Herbalist on phone: Not on file    Gets together: Not on file    Attends religious service: Not on file    Active member of club or organization: Not on file    Attends meetings of clubs or organizations: Not on file    Relationship status: Not on file  . Intimate partner violence    Fear of current or ex partner: Not on file    Emotionally abused: Not on  file    Physically abused: Not on file    Forced sexual activity: Not on file  Other Topics Concern  . Not on file  Social History Narrative  . Not on file    Review of Systems: See HPI, otherwise negative ROS  Physical Exam: BP 132/75   Pulse 78   Temp 97.9 F (36.6 C) (Oral)   Resp 18   Ht 5' 1"  (1.549 m)   Wt 79.4 kg   SpO2 99%   BMI 33.07 kg/m  General:   Alert,  Well-developed, well-nourished, pleasant and cooperative in NAD Neck:  Supple; no masses or thyromegaly. No significant cervical adenopathy. Lungs:  Clear throughout to auscultation.   No wheezes, crackles, or rhonchi. No acute distress. Heart:  Regular rate and rhythm; no murmurs, clicks, rubs,  or gallops. Abdomen: Non-distended, normal bowel sounds.  Soft and nontender without appreciable mass or hepatosplenomegaly.  Pulses:  Normal pulses noted. Extremities:  Without clubbing or edema.  Impression/Plan:   Pleasant 68 year old lady with history of [name] cirrhosis here for surveillance EGD.  Patient denies dysphagia.  History of esophageal varices.  May not need additional bands.  The risks, benefits, limitations, alternatives and imponderables have been reviewed with the patient. Potential for esophageal dilation, biopsy, etc. have also been reviewed.  Questions have been answered. All parties agreeable.     Notice: This dictation was prepared with Dragon dictation along with smaller phrase technology. Any transcriptional errors that result from this process are unintentional and may not be corrected upon review.

## 2019-02-24 NOTE — Discharge Instructions (Signed)
EGD Discharge instructions Please read the instructions outlined below and refer to this sheet in the next few weeks. These discharge instructions provide you with general information on caring for yourself after you leave the hospital. Your doctor may also give you specific instructions. While your treatment has been planned according to the most current medical practices available, unavoidable complications occasionally occur. If you have any problems or questions after discharge, please call your doctor. Dr Gala Romney:  [phone] ACTIVITY  You may resume your regular activity but move at a slower pace for the next 24 hours.   Take frequent rest periods for the next 24 hours.   Walking will help expel (get rid of) the air and reduce the bloated feeling in your abdomen.   No driving for 24 hours (because of the anesthesia (medicine) used during the test).   You may shower.   Do not sign any important legal documents or operate any machinery for 24 hours (because of the anesthesia used during the test).  NUTRITION  Drink plenty of fluids.   You may resume your normal diet.   Begin with a light meal and progress to your normal diet.   Avoid alcoholic beverages for 24 hours or as instructed by your caregiver.  MEDICATIONS  You may resume your normal medications unless your caregiver tells you otherwise.  WHAT YOU CAN EXPECT TODAY  You may experience abdominal discomfort such as a feeling of fullness or gas pains.  FOLLOW-UP  Your doctor will discuss the results of your test with you.  SEEK IMMEDIATE MEDICAL ATTENTION IF ANY OF THE FOLLOWING OCCUR:  Excessive nausea (feeling sick to your stomach) and/or vomiting.   Severe abdominal pain and distention (swelling).   Trouble swallowing.   Temperature over 101 F (37.8 C).   Rectal bleeding or vomiting of blood.    Continue your current medications.  Further recommendations to follow pending review of pathology  report  Repeat EGD in 18 months  Keep upcoming face-to-face office visit in the office.  At patient's request, I spoke to husband, Patrick Jupiter, at [phone]

## 2019-02-27 ENCOUNTER — Encounter: Payer: Self-pay | Admitting: Internal Medicine

## 2019-03-02 ENCOUNTER — Encounter (HOSPITAL_COMMUNITY): Payer: Self-pay | Admitting: Internal Medicine

## 2019-03-05 ENCOUNTER — Encounter: Payer: Self-pay | Admitting: Internal Medicine

## 2019-03-05 NOTE — Progress Notes (Signed)
03/05/19  [facility] [address] [place]  [id number]     Dear Alisha [name],  I am writing to inform you that the biopsies taken during your recent upper endoscopy showed Barretts esophagus.  There was no sign of tumor.  Please continue with the plan of treatment as outlined on the day of your examination.     Sincerely,  R. Garfield Cornea, MD [facility]

## 2019-03-06 ENCOUNTER — Inpatient Hospital Stay (HOSPITAL_COMMUNITY): Payer: Medicare Other | Attending: Oncology

## 2019-03-06 ENCOUNTER — Encounter (HOSPITAL_COMMUNITY): Payer: Self-pay

## 2019-03-06 ENCOUNTER — Other Ambulatory Visit: Payer: Self-pay

## 2019-03-06 VITALS — BP 125/53 | HR 72 | Temp 98.0°F | Resp 20

## 2019-03-06 DIAGNOSIS — E538 Deficiency of other specified B group vitamins: Secondary | ICD-10-CM | POA: Insufficient documentation

## 2019-03-06 DIAGNOSIS — Z79899 Other long term (current) drug therapy: Secondary | ICD-10-CM | POA: Insufficient documentation

## 2019-03-06 MED ORDER — CYANOCOBALAMIN 1000 MCG/ML IJ SOLN
1000.0000 ug | Freq: Once | INTRAMUSCULAR | Status: AC
  Administered 2019-03-06: 11:00:00 1000 ug via INTRAMUSCULAR

## 2019-03-06 NOTE — Progress Notes (Signed)
Alisha [name] presents today for injection per MD orders. B12 1058mg administered IM in left Upper Arm. Administration without incident. Patient tolerated well.

## 2019-03-23 ENCOUNTER — Other Ambulatory Visit: Payer: Self-pay | Admitting: Gastroenterology

## 2019-03-29 ENCOUNTER — Ambulatory Visit: Payer: Medicare Other | Admitting: Nurse Practitioner

## 2019-03-29 ENCOUNTER — Other Ambulatory Visit: Payer: Self-pay

## 2019-03-29 ENCOUNTER — Other Ambulatory Visit: Payer: Self-pay | Admitting: *Deleted

## 2019-03-29 ENCOUNTER — Telehealth: Payer: Self-pay | Admitting: *Deleted

## 2019-03-29 ENCOUNTER — Telehealth: Payer: Self-pay

## 2019-03-29 ENCOUNTER — Encounter: Payer: Self-pay | Admitting: Nurse Practitioner

## 2019-03-29 VITALS — BP 140/76 | HR 79 | Temp 97.3°F | Ht 61.0 in | Wt 189.8 lb

## 2019-03-29 DIAGNOSIS — I8510 Secondary esophageal varices without bleeding: Secondary | ICD-10-CM

## 2019-03-29 DIAGNOSIS — K7469 Other cirrhosis of liver: Secondary | ICD-10-CM

## 2019-03-29 DIAGNOSIS — D696 Thrombocytopenia, unspecified: Secondary | ICD-10-CM

## 2019-03-29 DIAGNOSIS — K7460 Unspecified cirrhosis of liver: Secondary | ICD-10-CM | POA: Diagnosis not present

## 2019-03-29 DIAGNOSIS — K7581 Nonalcoholic steatohepatitis (NASH): Secondary | ICD-10-CM

## 2019-03-29 NOTE — Progress Notes (Signed)
CC'D TO PCP °

## 2019-03-29 NOTE — Assessment & Plan Note (Signed)
Alisha [name] cirrhosis currently followed by [name] locally as well as [facility].  She is undergone transplant evaluation and was not a candidate for listing at the time of her evaluation due to low meld score.  Her meld score has slowly crept up.  She has a history of varices status post banding.  However, they feel she is not a good transplant candidate at this time due to uncontrolled diabetes and sedentary lifestyle.  They have coached her on this.  Recommend she follow-up with [facility] as they recommend.  They have requested local ultrasound and I provided an order.  We will get her scheduled with ultrasound and send results to their office.  No overt hepatic symptoms at this time.  Follow-up in 6 months.

## 2019-03-29 NOTE — Progress Notes (Signed)
ruq

## 2019-03-29 NOTE — Patient Instructions (Signed)
Your health issues we discussed today were:   Cirrhosis: 1. There is no need for rest of your labs today 2. We will get your ultrasound scheduled as requested by [facility] 3. Continue to monitor for any worsening symptoms including yellowing of your skin or eyes, sudden confusion, tremors/shakes, blood in your stools, pitch black tarry stools, vomiting blood 4. Call us if you have any of the symptoms or any other worsening or severe symptoms  Esophageal varices (swollen blood vessels in your swallowing tube): 1. Your last upper endoscopy looked good 2. You are due for repeat in 2021 and we will notify you when it is time to schedule 3. Notify us of any vomiting blood or pitch black stools  Overall I recommend:  1. Continue your current medications 2. Follow-up with [facility] based on their recommendations 3. Follow-up here locally in 6 months 4. Call us if you have any questions or concerns.   Because of recent events of COVID-19 ("Coronavirus"), follow CDC recommendations:  Wash your hand frequently Avoid touching your face Stay away from people who are sick If you have symptoms such as fever, cough, shortness of breath then call your healthcare provider for further guidance If you are sick, STAY AT HOME unless otherwise directed by your healthcare provider. Follow directions from state and national officials regarding staying safe   At [facility] we value your feedback. You may receive a survey about your visit today. Please share your experience as we strive to create trusting relationships with our patients to provide genuine, compassionate, quality care.  We appreciate your understanding and patience as we review any laboratory studies, imaging, and other diagnostic tests that are ordered as we care for you. Our office policy is 5 business days for review of these results, and any emergent or urgent results are addressed in a timely manner for your best interest. If you do  not hear from our office in 1 week, please contact us.   We also encourage the use of MyChart, which contains your medical information for your review as well. If you are not enrolled in this feature, an access code is on this after visit summary for your convenience. Thank you for allowing [name] to be involved in your care.  It was great to see you today!  I hope you have a great summer!!

## 2019-03-29 NOTE — Telephone Encounter (Signed)
Pt notified of meld score last week.

## 2019-03-29 NOTE — Telephone Encounter (Signed)
[facility]Q u/s scheduled for 7/20 at 8:30am, arrival time 8:15am, npo after midnight  LMOVM for pt  Once resulted, it needs to be faxed to Dr. Dorris Fetch at [phone]

## 2019-03-29 NOTE — Assessment & Plan Note (Signed)
Recently completed EGD with no new varices requiring banding.  Remainder eradicated.  She does remain on Inderal.  She is due for repeat endoscopy next year and is currently on the recall list.  I recommended she call us for any darkening stools or vomiting of blood.  Otherwise continue to monitor with endoscopic surveillance and nonspecific beta-blockade.

## 2019-03-29 NOTE — Assessment & Plan Note (Signed)
Chronic thrombocytopenia associated with cirrhosis.  Labs appear stable as drawn at [facility].  No overt excessive bleeding.  Continue to monitor and follow-up with routine labs.

## 2019-03-29 NOTE — Telephone Encounter (Signed)
Her MELD-NA score at [facility] last week was 20.

## 2019-03-29 NOTE — Telephone Encounter (Signed)
Pt would like to know her meld score. Pt was seen in office today.

## 2019-03-29 NOTE — Progress Notes (Signed)
Referring Provider: Glenda Chroman, MD Primary Care Physician:  Glenda Chroman, MD Primary GI:  Dr. Gala Romney  Chief Complaint  Patient presents with  . Cirrhosis    had [facility] appt last week    HPI:   Alisha [name] is a 68 y.o. female who presents for follow-up on cirrhosis and esophageal varices.  Patient was last seen in our office 09/28/2022 cirrhosis, abnormal MRI of the liver, secondary esophageal varices.  She is also seen by [facility] liver transplant center.  Likely [place] cirrhosis.  Colonoscopy up-to-date due for repeat in 2023.  Evaluation by liver transplant center noted meld of 13 with controlled esophageal variceal bleeding with banding protocol and recommended not missing the patient at that time but continue with regular follow-ups.  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  Previously noted subcentimeter category 3 segment for left liver lobe lesion has decreased in size and probably benign.  Stable cystic pancreatic lesions without high risk features recommended follow-up MRI with and without contrast in 2 years.  Also noted history of chronic renal failure stage IV and followed by nephrology likely due to long history of diabetes and hypertension.  Updated visit in December 2019 at [facility] transplant center noted meld increased to 18 though primarily driven by creatinine with kidney disease.  Recommended continue local follow-up and follow-up with her service in 6 months.  Also recommended sooner EGD than 1 year for grade 2 esophageal varices and history of variceal bleed based on AAS LD recommendations.  At her last visit she was doing well, fatigue better after B12 injection, no overt hepatic or GI symptoms.  Recommended continue to follow-up with [facility] transplant, repeat MRI, continue current medications, follow-up with our office in 6 months, schedule EGD.   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  Follow-up with MRI in 18 months.  No other overt findings.  EGD completed 02/24/2019 which found several areas of scarring esophagus consistent with prior banding, no varices identified/varices remain eradicated at this time.  Noted Barrett's esophagus status post biopsy and portal hypertensive gastropathy.  Recommended to continue medications including Protonix and propranolol.  Repeat EGD in 1-1/2 years for surveillance (December 2021).  Last visit with [facility] hepatology on 03/21/2019.  At this point her meld-NA score was 20.  She was overall deemed to be not a good surgical candidate due to poor control of diabetes and sedentary lifestyle.  This was discussed with her and she is going to work on this prior to her follow-up in 3 months.  Recommended updated U/S imaging (locally) time. Previous mildly elevated AFP with negative MRI for suspicious lesions.  Today she states she's doing well overall. Had appointment with [facility] last week. She has some increased fatigue recently, but thinks it's due to her age. Otherwise feels well. Denies abdominal pain, N/V, hematochezia, melena, fever, chills, unintentional weight loss. Denies URI or flu-like symptoms. Denies loss of sense of taste or smell. Denies yellowing of skin/eyes, darkened urine, acute episodic confusion, LE edema, abdominal distension, tremors/shakes. Denies chest pain, dyspnea, dizziness, lightheadedness, syncope, near syncope. Denies any other upper or lower GI symptoms.  Past Medical History:  Diagnosis Date  . B12 deficiency  11/03/2016  . B12 deficiency 11/03/2016  . Barrett's esophagus   . Cirrhosis of liver without mention of alcohol    hep B surface antigen and HCV ab negative.pt has not had hepatitis A  and B vaccines.U/S on 07/04/13 shows cirrhosis.  . Diverticula of colon    pancolonic  . Esophageal reflux   . Esophageal varices ([place])   . Hiatal hernia   . Iron deficiency anemia, unspecified   . Other and unspecified hyperlipidemia   . Portal hypertensive gastropathy ([place])   . Tubular adenoma   . Type II or unspecified type diabetes mellitus without mention of complication, not stated as uncontrolled   . Unspecified essential hypertension   . Unspecified hemorrhoids without mention of complication     Past Surgical History:  Procedure Laterality Date  . ABDOMINAL HYSTERECTOMY    . BIOPSY  04/30/2016   Procedure: BIOPSY;  Surgeon: Daneil Dolin, MD;  [facility];  Service: Endoscopy;;  esophagus  . BIOPSY  02/24/2019   Procedure: BIOPSY;  Surgeon: Daneil Dolin, MD;  [facility];  Service: Endoscopy;;  . CESAREAN SECTION     x 2  . COLONOSCOPY  03/2006   left sided diverticula, splenic flexure tublar adenoma  . COLONOSCOPY  07/2002   villous tubular adenoma in rectum  . COLONOSCOPY  09/23/09   external hemorrhoids/scattered pan colonic diverticula otherwise normal. cecal lipoma bx negative, normal TI. Next TCS 09/2014  . COLONOSCOPY N/A 11/12/2014   Procedure: COLONOSCOPY;  Surgeon: Daneil Dolin, MD;  [facility];  Service: Endoscopy;  Laterality: N/A;  1215 - moved to 12:30 - Ginger to notify pt  . COLONOSCOPY N/A 01/07/2017   Procedure: COLONOSCOPY;  Surgeon: Daneil Dolin, MD;  [facility];  Service: Endoscopy;  Laterality: N/A;  3:15PM  . ESOPHAGEAL BANDING N/A 12/20/2016   Procedure: ESOPHAGEAL BANDING;  Surgeon: Danie Binder, MD;  [facility];  Service: Endoscopy;  Laterality: N/A;  . ESOPHAGEAL BANDING N/A 03/18/2017   Procedure: ESOPHAGEAL BANDING;  Surgeon: Daneil Dolin, MD;  [facility];  Service: Endoscopy;  Laterality: N/A;  . ESOPHAGEAL BANDING  12/17/2017   Procedure: ESOPHAGEAL BANDING;   Surgeon: Daneil Dolin, MD;  [facility];  Service: Endoscopy;;  . ESOPHAGOGASTRODUODENOSCOPY  08/2006   barretts esophagus, no dyplasia  . ESOPHAGOGASTRODUODENOSCOPY  03/2006   barretts esophagus,bx focal atypia c/w low grade dysplasia, SB bx negative for celiac  . ESOPHAGOGASTRODUODENOSCOPY  09/23/09   3 columns of grade 2 esophageal varices/hiatal hernia/3-4 cm segment Barrett's esophagus without dysplasia. Next EGD 09/2012  . ESOPHAGOGASTRODUODENOSCOPY N/A 11/11/2012   Dr. Gala Romney- Barrett;s esophagus, esophageal varices, portal gastopathy. hiatal hernia  . ESOPHAGOGASTRODUODENOSCOPY N/A 04/30/2016   Procedure: ESOPHAGOGASTRODUODENOSCOPY (EGD);  Surgeon: Daneil Dolin, MD;  [facility];  Service: Endoscopy;  Laterality: N/A;  815  . ESOPHAGOGASTRODUODENOSCOPY N/A 12/20/2016   Procedure: ESOPHAGOGASTRODUODENOSCOPY (EGD);  Surgeon: Danie Binder, MD;  [facility];  Service: Endoscopy;  Laterality: N/A;  . ESOPHAGOGASTRODUODENOSCOPY N/A 01/07/2017   Procedure: ESOPHAGOGASTRODUODENOSCOPY (EGD);  Surgeon: Daneil Dolin, MD;  [facility];  Service: Endoscopy;  Laterality: N/A;  . ESOPHAGOGASTRODUODENOSCOPY N/A 03/18/2017   Procedure: ESOPHAGOGASTRODUODENOSCOPY (EGD);  Surgeon: Daneil Dolin, MD;  [facility];  Service: Endoscopy;  Laterality: N/A;  1030   . ESOPHAGOGASTRODUODENOSCOPY N/A 12/17/2017   Procedure: ESOPHAGOGASTRODUODENOSCOPY (EGD);  Surgeon: Daneil Dolin, MD;  [facility];  Service: Endoscopy;  Laterality: N/A;  10:30am  . ESOPHAGOGASTRODUODENOSCOPY N/A 02/24/2019   Procedure: ESOPHAGOGASTRODUODENOSCOPY (EGD);  Surgeon: Daneil Dolin, MD;  [facility];  Service: Endoscopy;  Laterality: N/A;  7:30am  . HEMORRHOID SURGERY  2008  . small bowel capsule endoscopy  10/2009   normal    Current Outpatient Medications  Medication Sig Dispense Refill  . ACCU-CHEK AVIVA PLUS test strip USE ONE STRIP TWICE  DAILY    . ACCU-CHEK SOFTCLIX LANCETS lancets USE TO CHECK BLOOD SUGAR TWICE DAILY.    Marland Kitchen Cholecalciferol (VITAMIN D) 2000 units tablet Take 2,000 Units by mouth daily.    . cyanocobalamin (,VITAMIN B-12,) 1000 MCG/ML injection Inject 1,000 mcg into the muscle every 30 (thirty) days.     Marland Kitchen GLIPIZIDE XL 10 MG 24 hr tablet Take 10 mg by mouth daily.     Marland Kitchen lisinopril (PRINIVIL,ZESTRIL) 10 MG tablet Take 10 mg by mouth daily.     Marland Kitchen NOVOFINE 32G X 6 MM MISC USE ONE AS DIRECTED TWICE DAILY.    Marland Kitchen NOVOTWIST 32G X 5 MM MISC USE ONE AS DIRECTED TWICE DAILY.    . pantoprazole (PROTONIX) 40 MG tablet Take 1 tablet (40 mg total) by mouth 2 (two) times daily before a meal. 60 tablet 1  . propranolol (INDERAL) 10 MG tablet TAKE THREE (3) TABLETS BY MOUTH EVERY MORNING AND 3 TABLETS EVERY EVENING. 180 tablet 5  . TRESIBA FLEXTOUCH 200 UNIT/ML SOPN Inject 26-44 Units into the skin at bedtime. Uses sliding scale once a day  12  . VICTOZA 18 MG/3ML SOLN Inject 1.8 mg into the skin daily.      No current facility-administered medications for this visit.     Allergies as of 03/29/2019 - Review Complete 03/29/2019  Allergen Reaction Noted  . Codeine Nausea And Vomiting 04/19/2012    Family History  Problem Relation Age of Onset  . Alzheimer's disease Mother   . CAD Father   . Diabetes Mellitus II Father   . Alzheimer's disease Father   . Diabetes Mellitus II Sister   . Colon cancer Sister 13       small, surgical excision; chemo/rad not needed  . Cancer - Other Brother   . Diabetes Mellitus II Brother   . Hypertension Brother   . Liver disease Neg Hx     Social History   Socioeconomic History  . Marital status: Married    Spouse name: Not on file  . Number of children: Not on file  . Years of education: Not on file  . Highest education level: Not on file  Occupational History  . Not on file  Social Needs  . Financial resource strain: Not on file  . Food insecurity    Worry: Not on file     Inability: Not on file  . Transportation needs    Medical: Not on file    Non-medical: Not on file  Tobacco Use  . Smoking status: Never Smoker  . Smokeless tobacco: Never Used  Substance and Sexual Activity  . Alcohol use: No  . Drug use: No  . Sexual activity: Yes  Lifestyle  . Physical activity    Days per week: Not on file    Minutes per session: Not on file  . Stress: Not on file  Relationships  . Social Herbalist on phone: Not on file    Gets together: Not on file  Attends religious service: Not on file    Active member of club or organization: Not on file    Attends meetings of clubs or organizations: Not on file    Relationship status: Not on file  Other Topics Concern  . Not on file  Social History Narrative  . Not on file    Review of Systems: General: Negative for anorexia, weight loss, fever, chills. ENT: Negative for hoarseness, difficulty swallowing. CV: Negative for chest pain, angina, palpitations, peripheral edema.  Respiratory: Negative for dyspnea at rest, cough, sputum, wheezing.  GI: See history of present illness. Derm: Negative for rash or itching.  Neuro: Negative for memory loss, confusion.  Endo: Negative for unusual weight change.  Heme: Negative for bruising or bleeding. Allergy: Negative for rash or hives.   Physical Exam: BP 140/76   Pulse 79   Temp (!) 97.3 F (36.3 C) (Oral)   Ht 5' 1"  (1.549 m)   Wt 189 lb 12.8 oz (86.1 kg)   BMI 35.86 kg/m  General:   Alert and oriented. Pleasant and cooperative. Well-nourished and well-developed.  Eyes:  Without icterus, sclera clear and conjunctiva pink.  Ears:  Normal auditory acuity. Cardiovascular:  S1, S2 present without murmurs appreciated. Extremities without clubbing or edema. Respiratory:  Clear to auscultation bilaterally. No wheezes, rales, or rhonchi. No distress.  Gastrointestinal:  +BS, soft, non-tender and non-distended. No HSM noted. No guarding or rebound. No  masses appreciated.  Rectal:  Deferred  Musculoskalatal:  Symmetrical without gross deformities. Neurologic:  Alert and oriented x4;  grossly normal neurologically. Psych:  Alert and cooperative. Normal mood and affect. Heme/Lymph/Immune: No excessive bruising noted.    03/29/2019 10:42 AM   Disclaimer: This note was dictated with voice recognition software. Similar sounding words can inadvertently be transcribed and may not be corrected upon review.

## 2019-03-30 ENCOUNTER — Other Ambulatory Visit: Payer: Self-pay

## 2019-03-30 ENCOUNTER — Inpatient Hospital Stay (HOSPITAL_COMMUNITY): Payer: Medicare Other | Attending: Oncology

## 2019-03-30 DIAGNOSIS — K219 Gastro-esophageal reflux disease without esophagitis: Secondary | ICD-10-CM | POA: Diagnosis not present

## 2019-03-30 DIAGNOSIS — D500 Iron deficiency anemia secondary to blood loss (chronic): Secondary | ICD-10-CM | POA: Diagnosis not present

## 2019-03-30 DIAGNOSIS — K766 Portal hypertension: Secondary | ICD-10-CM | POA: Diagnosis not present

## 2019-03-30 DIAGNOSIS — K7460 Unspecified cirrhosis of liver: Secondary | ICD-10-CM | POA: Diagnosis not present

## 2019-03-30 DIAGNOSIS — K449 Diaphragmatic hernia without obstruction or gangrene: Secondary | ICD-10-CM | POA: Diagnosis not present

## 2019-03-30 DIAGNOSIS — I10 Essential (primary) hypertension: Secondary | ICD-10-CM | POA: Insufficient documentation

## 2019-03-30 DIAGNOSIS — E538 Deficiency of other specified B group vitamins: Secondary | ICD-10-CM | POA: Insufficient documentation

## 2019-03-30 DIAGNOSIS — E785 Hyperlipidemia, unspecified: Secondary | ICD-10-CM | POA: Insufficient documentation

## 2019-03-30 DIAGNOSIS — Z79899 Other long term (current) drug therapy: Secondary | ICD-10-CM | POA: Diagnosis not present

## 2019-03-30 DIAGNOSIS — E1122 Type 2 diabetes mellitus with diabetic chronic kidney disease: Secondary | ICD-10-CM | POA: Diagnosis not present

## 2019-03-30 DIAGNOSIS — R7989 Other specified abnormal findings of blood chemistry: Secondary | ICD-10-CM | POA: Insufficient documentation

## 2019-03-30 LAB — COMPREHENSIVE METABOLIC PANEL
ALT: 23 U/L (ref 0–44)
AST: 36 U/L (ref 15–41)
Albumin: 3.8 g/dL (ref 3.5–5.0)
Alkaline Phosphatase: 77 U/L (ref 38–126)
Anion gap: 8 (ref 5–15)
BUN: 16 mg/dL (ref 8–23)
CO2: 23 mmol/L (ref 22–32)
Calcium: 9.2 mg/dL (ref 8.9–10.3)
Chloride: 109 mmol/L (ref 98–111)
Creatinine, Ser: 1.65 mg/dL — ABNORMAL HIGH (ref 0.44–1.00)
GFR calc Af Amer: 37 mL/min — ABNORMAL LOW (ref >60)
GFR calc non Af Amer: 32 mL/min — ABNORMAL LOW (ref >60)
Glucose, Bld: 175 mg/dL — ABNORMAL HIGH (ref 70–99)
Potassium: 4.3 mmol/L (ref 3.5–5.1)
Sodium: 140 mmol/L (ref 135–145)
Total Bilirubin: 3.5 mg/dL — ABNORMAL HIGH (ref 0.3–1.2)
Total Protein: 6.8 g/dL (ref 6.5–8.1)

## 2019-03-30 LAB — CBC WITH DIFFERENTIAL/PLATELET
Abs Immature Granulocytes: 0.01 10*3/uL (ref 0.00–0.07)
Basophils Absolute: 0 10*3/uL (ref 0.0–0.1)
Basophils Relative: 0 %
Eosinophils Absolute: 0.1 10*3/uL (ref 0.0–0.5)
Eosinophils Relative: 3 %
HCT: 36.1 % (ref 36.0–46.0)
Hemoglobin: 12.1 g/dL (ref 12.0–15.0)
Immature Granulocytes: 0 %
Lymphocytes Relative: 28 %
Lymphs Abs: 1.2 10*3/uL (ref 0.7–4.0)
MCH: 30.8 pg (ref 26.0–34.0)
MCHC: 33.5 g/dL (ref 30.0–36.0)
MCV: 91.9 fL (ref 80.0–100.0)
Monocytes Absolute: 0.4 10*3/uL (ref 0.1–1.0)
Monocytes Relative: 8 %
Neutro Abs: 2.7 10*3/uL (ref 1.7–7.7)
Neutrophils Relative %: 61 %
Platelets: 59 10*3/uL — ABNORMAL LOW (ref 150–400)
RBC: 3.93 MIL/uL (ref 3.87–5.11)
RDW: 14 % (ref 11.5–15.5)
WBC: 4.5 10*3/uL (ref 4.0–10.5)
nRBC: 0 % (ref 0.0–0.2)

## 2019-03-30 LAB — IRON AND TIBC
Iron: 114 ug/dL (ref 28–170)
Saturation Ratios: 41 % — ABNORMAL HIGH (ref 10.4–31.8)
TIBC: 278 ug/dL (ref 250–450)
UIBC: 164 ug/dL

## 2019-03-30 LAB — VITAMIN B12: Vitamin B-12: 940 pg/mL — ABNORMAL HIGH (ref 180–914)

## 2019-03-30 LAB — FOLATE: Folate: 13.1 ng/mL (ref >5.9)

## 2019-03-30 LAB — LACTATE DEHYDROGENASE: LDH: 178 U/L (ref 98–192)

## 2019-03-30 LAB — FERRITIN: Ferritin: 273 ng/mL (ref 11–307)

## 2019-03-30 NOTE — Telephone Encounter (Signed)
Patient aware of appt details.

## 2019-03-31 LAB — VITAMIN D 25 HYDROXY (VIT D DEFICIENCY, FRACTURES): Vit D, 25-Hydroxy: 34.3 ng/mL (ref 30.0–100.0)

## 2019-04-03 ENCOUNTER — Ambulatory Visit (HOSPITAL_COMMUNITY)
Admission: RE | Admit: 2019-04-03 | Discharge: 2019-04-03 | Disposition: A | Discharge status: Home / Self Care | Payer: Medicare Other | Source: Ambulatory Visit | Attending: Nurse Practitioner | Admitting: Nurse Practitioner

## 2019-04-03 ENCOUNTER — Other Ambulatory Visit: Payer: Self-pay

## 2019-04-03 DIAGNOSIS — K7581 Nonalcoholic steatohepatitis (NASH): Secondary | ICD-10-CM

## 2019-04-03 DIAGNOSIS — K7469 Other cirrhosis of liver: Secondary | ICD-10-CM | POA: Diagnosis present

## 2019-04-05 ENCOUNTER — Encounter (HOSPITAL_COMMUNITY): Payer: Self-pay | Admitting: Hematology

## 2019-04-05 ENCOUNTER — Other Ambulatory Visit: Payer: Self-pay

## 2019-04-05 ENCOUNTER — Inpatient Hospital Stay (HOSPITAL_COMMUNITY): Payer: Medicare Other | Admitting: Hematology

## 2019-04-05 ENCOUNTER — Inpatient Hospital Stay (HOSPITAL_COMMUNITY): Payer: Medicare Other

## 2019-04-05 DIAGNOSIS — K449 Diaphragmatic hernia without obstruction or gangrene: Secondary | ICD-10-CM

## 2019-04-05 DIAGNOSIS — E785 Hyperlipidemia, unspecified: Secondary | ICD-10-CM

## 2019-04-05 DIAGNOSIS — E1122 Type 2 diabetes mellitus with diabetic chronic kidney disease: Secondary | ICD-10-CM

## 2019-04-05 DIAGNOSIS — I10 Essential (primary) hypertension: Secondary | ICD-10-CM

## 2019-04-05 DIAGNOSIS — D500 Iron deficiency anemia secondary to blood loss (chronic): Secondary | ICD-10-CM

## 2019-04-05 DIAGNOSIS — Z79899 Other long term (current) drug therapy: Secondary | ICD-10-CM | POA: Diagnosis not present

## 2019-04-05 DIAGNOSIS — K219 Gastro-esophageal reflux disease without esophagitis: Secondary | ICD-10-CM

## 2019-04-05 DIAGNOSIS — R7989 Other specified abnormal findings of blood chemistry: Secondary | ICD-10-CM

## 2019-04-05 DIAGNOSIS — K766 Portal hypertension: Secondary | ICD-10-CM

## 2019-04-05 DIAGNOSIS — E538 Deficiency of other specified B group vitamins: Secondary | ICD-10-CM | POA: Diagnosis not present

## 2019-04-05 DIAGNOSIS — K7460 Unspecified cirrhosis of liver: Secondary | ICD-10-CM

## 2019-04-05 MED ORDER — CYANOCOBALAMIN 1000 MCG/ML IJ SOLN
1000.0000 ug | Freq: Once | INTRAMUSCULAR | Status: AC
  Administered 2019-04-05: 1000 ug via INTRAMUSCULAR

## 2019-04-06 NOTE — Progress Notes (Signed)
[place], [place] [address]   CLINIC:  Medical Oncology/Hematology  PCP:  Glenda Chroman, MD [address] [place] [address] [phone]   REASON FOR VISIT:  Follow-up for Anemia  CURRENT THERAPY: Clinical surveillance    INTERVAL HISTORY:  Ms. Tootle 68 y.o. female presents today for follow-up.  She reports overall doing well.  She denies any significant fatigue.  She denies any evidence of bleeding.  She states she has followed up with [facility] transplant team concerning liver transplant and was advised she needs to reduce her A1c.  She otherwise is doing well.  Denies any fevers, chills, night sweats.  Denies any abdominal pain, change in bowel habits, no weight gain or weight loss.  REVIEW OF SYSTEMS:  Review of Systems  Constitutional: Negative.   HENT:  Negative.   Eyes: Negative.   Respiratory: Negative.   Cardiovascular: Negative.   Gastrointestinal: Negative.   Endocrine: Negative.   Genitourinary: Negative.    Musculoskeletal: Positive for arthralgias.  Skin: Negative.   Neurological: Negative.   Hematological: Negative.   Psychiatric/Behavioral: Negative.      PAST MEDICAL/SURGICAL HISTORY:  Past Medical History:  Diagnosis Date  . B12 deficiency 11/03/2016  . B12 deficiency 11/03/2016  . Barrett's esophagus   . Cirrhosis of liver without mention of alcohol    hep B surface antigen and HCV ab negative.pt has not had hepatitis A and B vaccines.U/S on 07/04/13 shows cirrhosis.  . Diverticula of colon    pancolonic  . Esophageal reflux   . Esophageal varices ([place])   . Hiatal hernia   . Iron deficiency anemia, unspecified   . Other and unspecified hyperlipidemia   . Portal hypertensive gastropathy ([place])   . Tubular adenoma   . Type II or unspecified type diabetes mellitus without mention of complication, not stated as uncontrolled   . Unspecified essential hypertension   . Unspecified hemorrhoids without mention of  complication    Past Surgical History:  Procedure Laterality Date  . ABDOMINAL HYSTERECTOMY    . BIOPSY  04/30/2016   Procedure: BIOPSY;  Surgeon: Daneil Dolin, MD;  [facility];  Service: Endoscopy;;  esophagus  . BIOPSY  02/24/2019   Procedure: BIOPSY;  Surgeon: Daneil Dolin, MD;  [facility];  Service: Endoscopy;;  . CESAREAN SECTION     x 2  . COLONOSCOPY  03/2006   left sided diverticula, splenic flexure tublar adenoma  . COLONOSCOPY  07/2002   villous tubular adenoma in rectum  . COLONOSCOPY  09/23/09   external hemorrhoids/scattered pan colonic diverticula otherwise normal. cecal lipoma bx negative, normal TI. Next TCS 09/2014  . COLONOSCOPY N/A 11/12/2014   Procedure: COLONOSCOPY;  Surgeon: Daneil Dolin, MD;  [facility];  Service: Endoscopy;  Laterality: N/A;  1215 - moved to 12:30 - Ginger to notify pt  . COLONOSCOPY N/A 01/07/2017   Procedure: COLONOSCOPY;  Surgeon: Daneil Dolin, MD;  [facility];  Service: Endoscopy;  Laterality: N/A;  3:15PM  . ESOPHAGEAL BANDING N/A 12/20/2016   Procedure: ESOPHAGEAL BANDING;  Surgeon: Danie Binder, MD;  [facility];  Service: Endoscopy;  Laterality: N/A;  . ESOPHAGEAL BANDING N/A 03/18/2017   Procedure: ESOPHAGEAL BANDING;  Surgeon: Daneil Dolin, MD;  [facility];  Service: Endoscopy;  Laterality: N/A;  . ESOPHAGEAL BANDING  12/17/2017   Procedure: ESOPHAGEAL BANDING;  Surgeon: Daneil Dolin,  MD;  [facility];  Service: Endoscopy;;  . ESOPHAGOGASTRODUODENOSCOPY  08/2006   barretts esophagus, no dyplasia  . ESOPHAGOGASTRODUODENOSCOPY  03/2006   barretts esophagus,bx focal atypia c/w low grade dysplasia, SB bx negative for celiac  . ESOPHAGOGASTRODUODENOSCOPY  09/23/09   3 columns of grade 2 esophageal varices/hiatal hernia/3-4 cm segment Barrett's esophagus without dysplasia. Next EGD 09/2012  . ESOPHAGOGASTRODUODENOSCOPY N/A 11/11/2012   Dr. Gala Romney-  Barrett;s esophagus, esophageal varices, portal gastopathy. hiatal hernia  . ESOPHAGOGASTRODUODENOSCOPY N/A 04/30/2016   Procedure: ESOPHAGOGASTRODUODENOSCOPY (EGD);  Surgeon: Daneil Dolin, MD;  [facility];  Service: Endoscopy;  Laterality: N/A;  815  . ESOPHAGOGASTRODUODENOSCOPY N/A 12/20/2016   Procedure: ESOPHAGOGASTRODUODENOSCOPY (EGD);  Surgeon: Danie Binder, MD;  [facility];  Service: Endoscopy;  Laterality: N/A;  . ESOPHAGOGASTRODUODENOSCOPY N/A 01/07/2017   Procedure: ESOPHAGOGASTRODUODENOSCOPY (EGD);  Surgeon: Daneil Dolin, MD;  [facility];  Service: Endoscopy;  Laterality: N/A;  . ESOPHAGOGASTRODUODENOSCOPY N/A 03/18/2017   Procedure: ESOPHAGOGASTRODUODENOSCOPY (EGD);  Surgeon: Daneil Dolin, MD;  [facility];  Service: Endoscopy;  Laterality: N/A;  1030   . ESOPHAGOGASTRODUODENOSCOPY N/A 12/17/2017   Procedure: ESOPHAGOGASTRODUODENOSCOPY (EGD);  Surgeon: Daneil Dolin, MD;  [facility];  Service: Endoscopy;  Laterality: N/A;  10:30am  . ESOPHAGOGASTRODUODENOSCOPY N/A 02/24/2019   Procedure: ESOPHAGOGASTRODUODENOSCOPY (EGD);  Surgeon: Daneil Dolin, MD;  [facility];  Service: Endoscopy;  Laterality: N/A;  7:30am  . HEMORRHOID SURGERY  2008  . small bowel capsule endoscopy  10/2009   normal     SOCIAL HISTORY:  Social History   Socioeconomic History  . Marital status: Married    Spouse name: Not on file  . Number of children: Not on file  . Years of education: Not on file  . Highest education level: Not on file  Occupational History  . Not on file  Social Needs  . Financial resource strain: Not on file  . Food insecurity    Worry: Not on file    Inability: Not on file  . Transportation needs    Medical: Not on file    Non-medical: Not on file  Tobacco Use  . Smoking status: Never Smoker  . Smokeless tobacco: Never Used  Substance and Sexual Activity  . Alcohol use: No  . Drug use: No   . Sexual activity: Yes  Lifestyle  . Physical activity    Days per week: Not on file    Minutes per session: Not on file  . Stress: Not on file  Relationships  . Social Herbalist on phone: Not on file    Gets together: Not on file    Attends religious service: Not on file    Active member of club or organization: Not on file    Attends meetings of clubs or organizations: Not on file    Relationship status: Not on file  . Intimate partner violence    Fear of current or ex partner: Not on file    Emotionally abused: Not on file    Physically abused: Not on file    Forced sexual activity: Not on file  Other Topics Concern  . Not on file  Social History Narrative  . Not on file    FAMILY HISTORY:  Family History  Problem Relation Age of Onset  . Alzheimer's disease Mother   . CAD Father   . Diabetes Mellitus II Father   . Alzheimer's  disease Father   . Diabetes Mellitus II Sister   . Colon cancer Sister 34       small, surgical excision; chemo/rad not needed  . Cancer - Other Brother   . Diabetes Mellitus II Brother   . Hypertension Brother   . Liver disease Neg Hx     CURRENT MEDICATIONS:  Outpatient Encounter Medications as of 04/05/2019  Medication Sig Note  . ACCU-CHEK AVIVA PLUS test strip USE ONE STRIP TWICE DAILY   . ACCU-CHEK SOFTCLIX LANCETS lancets USE TO CHECK BLOOD SUGAR TWICE DAILY.   Marland Kitchen Cholecalciferol (VITAMIN D) 2000 units tablet Take 2,000 Units by mouth daily.   . cyanocobalamin (,VITAMIN B-12,) 1000 MCG/ML injection Inject 1,000 mcg into the muscle every 30 (thirty) days.  12/13/2018: LAST DOSE:12/07/2018  . GLIPIZIDE XL 10 MG 24 hr tablet Take 10 mg by mouth daily.    Marland Kitchen lisinopril (PRINIVIL,ZESTRIL) 10 MG tablet Take 10 mg by mouth daily.    Marland Kitchen NOVOFINE 32G X 6 MM MISC USE ONE AS DIRECTED TWICE DAILY.   Marland Kitchen NOVOTWIST 32G X 5 MM MISC USE ONE AS DIRECTED TWICE DAILY.   . pantoprazole (PROTONIX) 40 MG tablet Take 1 tablet (40 mg total) by mouth 2  (two) times daily before a meal.   . propranolol (INDERAL) 10 MG tablet TAKE THREE (3) TABLETS BY MOUTH EVERY MORNING AND 3 TABLETS EVERY EVENING.   Tyler Aas FLEXTOUCH 200 UNIT/ML SOPN Inject 26-44 Units into the skin at bedtime. Uses sliding scale once a day   . VICTOZA 18 MG/3ML SOLN Inject 1.8 mg into the skin daily.     No facility-administered encounter medications on file as of 04/05/2019.     ALLERGIES:  Allergies  Allergen Reactions  . Codeine Nausea And Vomiting     PHYSICAL EXAM:  ECOG Performance status: 1  Vitals:   04/05/19 1347  BP: (!) 140/50  Pulse: 73  Resp: 16  Temp: 98.2 F (36.8 C)  SpO2: 97%   Filed Weights   04/05/19 1347  Weight: 191 lb 12.8 oz (87 kg)    Physical Exam Constitutional:      Appearance: Normal appearance. She is obese.  HENT:     Head: Normocephalic.     Nose: Nose normal.     Mouth/Throat:     Mouth: Mucous membranes are moist.     Pharynx: Oropharynx is clear.  Eyes:     Extraocular Movements: Extraocular movements intact.     Conjunctiva/sclera: Conjunctivae normal.  Neck:     Musculoskeletal: Normal range of motion.  Cardiovascular:     Rate and Rhythm: Normal rate and regular rhythm.     Pulses: Normal pulses.     Heart sounds: Normal heart sounds.  Pulmonary:     Effort: Pulmonary effort is normal.     Breath sounds: Normal breath sounds.  Abdominal:     General: Bowel sounds are normal.  Genitourinary:    General: Normal vulva.  Musculoskeletal: Normal range of motion.  Skin:    General: Skin is warm.  Neurological:     General: No focal deficit present.     Mental Status: She is alert and oriented to person, place, and time.  Psychiatric:        Mood and Affect: Mood normal.        Behavior: Behavior normal.        Thought Content: Thought content normal.        Judgment: Judgment normal.  LABORATORY DATA:  I have reviewed the labs as listed.  CBC    Component Value Date/Time   WBC 4.5  03/30/2019 1207   RBC 3.93 03/30/2019 1207   HGB 12.1 03/30/2019 1207   HCT 36.1 03/30/2019 1207   PLT 59 (L) 03/30/2019 1207   MCV 91.9 03/30/2019 1207   MCH 30.8 03/30/2019 1207   MCHC 33.5 03/30/2019 1207   RDW 14.0 03/30/2019 1207   LYMPHSABS 1.2 03/30/2019 1207   MONOABS 0.4 03/30/2019 1207   EOSABS 0.1 03/30/2019 1207   BASOSABS 0.0 03/30/2019 1207   CMP Latest Ref Rng & Units 03/30/2019 12/28/2018 09/28/2018  Glucose 70 - 99 mg/dL 175(H) 289(H) 189(H)  BUN 8 - 23 mg/dL 16 16 16   Creatinine 0.44 - 1.00 mg/dL 1.65(H) 1.79(H) 1.70(H)  Sodium 135 - 145 mmol/L 140 138 139  Potassium 3.5 - 5.1 mmol/L 4.3 4.6 4.1  Chloride 98 - 111 mmol/L 109 109 107  CO2 22 - 32 mmol/L 23 23 26   Calcium 8.9 - 10.3 mg/dL 9.2 9.1 8.9  Total Protein 6.5 - 8.1 g/dL 6.8 7.0 6.7  Total Bilirubin 0.3 - 1.2 mg/dL 3.5(H) 2.9(H) 2.9(H)  Alkaline Phos 38 - 126 U/L 77 84 60  AST 15 - 41 U/L 36 33 27  ALT 0 - 44 U/L 23 29 18        ASSESSMENT & PLAN:   Iron deficiency anemia 1.  Normocytic anemia: -This is from a combination of CKD, chronic blood loss, and iron deficiency. -Last Feraheme was on 12/24/2017 and 01/03/2018. - Hemoglobin is stable 12.9, ferritin within normal 273.  Patient does not require any parenteral iron at this time. -We will continue to monitor.  Will return to clinic in 3 months.  2.  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 - Platelet count is more or less stable and stays around 50.   3.  Elevated creatinine: - This is stable at this time we will continue to monitor.  4.  Cirrhosis: - She follows up with the liver transplant program at [facility]. -Her last MRI of the abdomen without contrast on 10/04/2018 showed she had cirrhosis and portal venous hypertension, without suspicious liver lesion.  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 -Elevated bilirubin at 3.5 this is secondary to  cirrhosis.  5.  B12 deficiency: - Patient takes B12 injections monthly. - She will continue on her monthly injections and we will recheck her levels in 3 months.      Orders placed this encounter:  Orders Placed This Encounter  Procedures  . CBC with Differential  . Comprehensive metabolic panel  . Iron and TIBC  . Ferritin  . [place] [phone]

## 2019-04-06 NOTE — Assessment & Plan Note (Signed)
1.  Normocytic anemia: -This is from a combination of CKD, chronic blood loss, and iron deficiency. -Last Feraheme was on 12/24/2017 and 01/03/2018. - Hemoglobin is stable 12.9, ferritin within normal 273.  Patient does not require any parenteral iron at this time. -We will continue to monitor.  Will return to clinic in 3 months.  2.  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 - Platelet count is more or less stable and stays around 50.   3.  Elevated creatinine: - This is stable at this time we will continue to monitor.  4.  Cirrhosis: - She follows up with the liver transplant program at [facility]. -Her last MRI of the abdomen without contrast on 10/04/2018 showed she had cirrhosis and portal venous hypertension, without suspicious liver lesion.  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 -Elevated bilirubin at 3.5 this is secondary to cirrhosis.  5.  B12 deficiency: - Patient takes B12 injections monthly. - She will continue on her monthly injections and we will recheck her levels in 3 months.

## 2019-04-12 IMAGING — DX DG CHEST 2V
2 series · 2 of 2 positions shown · non-contrast
Comparison: MRI of the liver December 04, 2016 and chest x-ray April 22, 2012.

CLINICAL DATA: Pre MRI study. Please verify positioning of the clip
in the colon.

EXAM:
CHEST  2 VIEW

[chest pa]
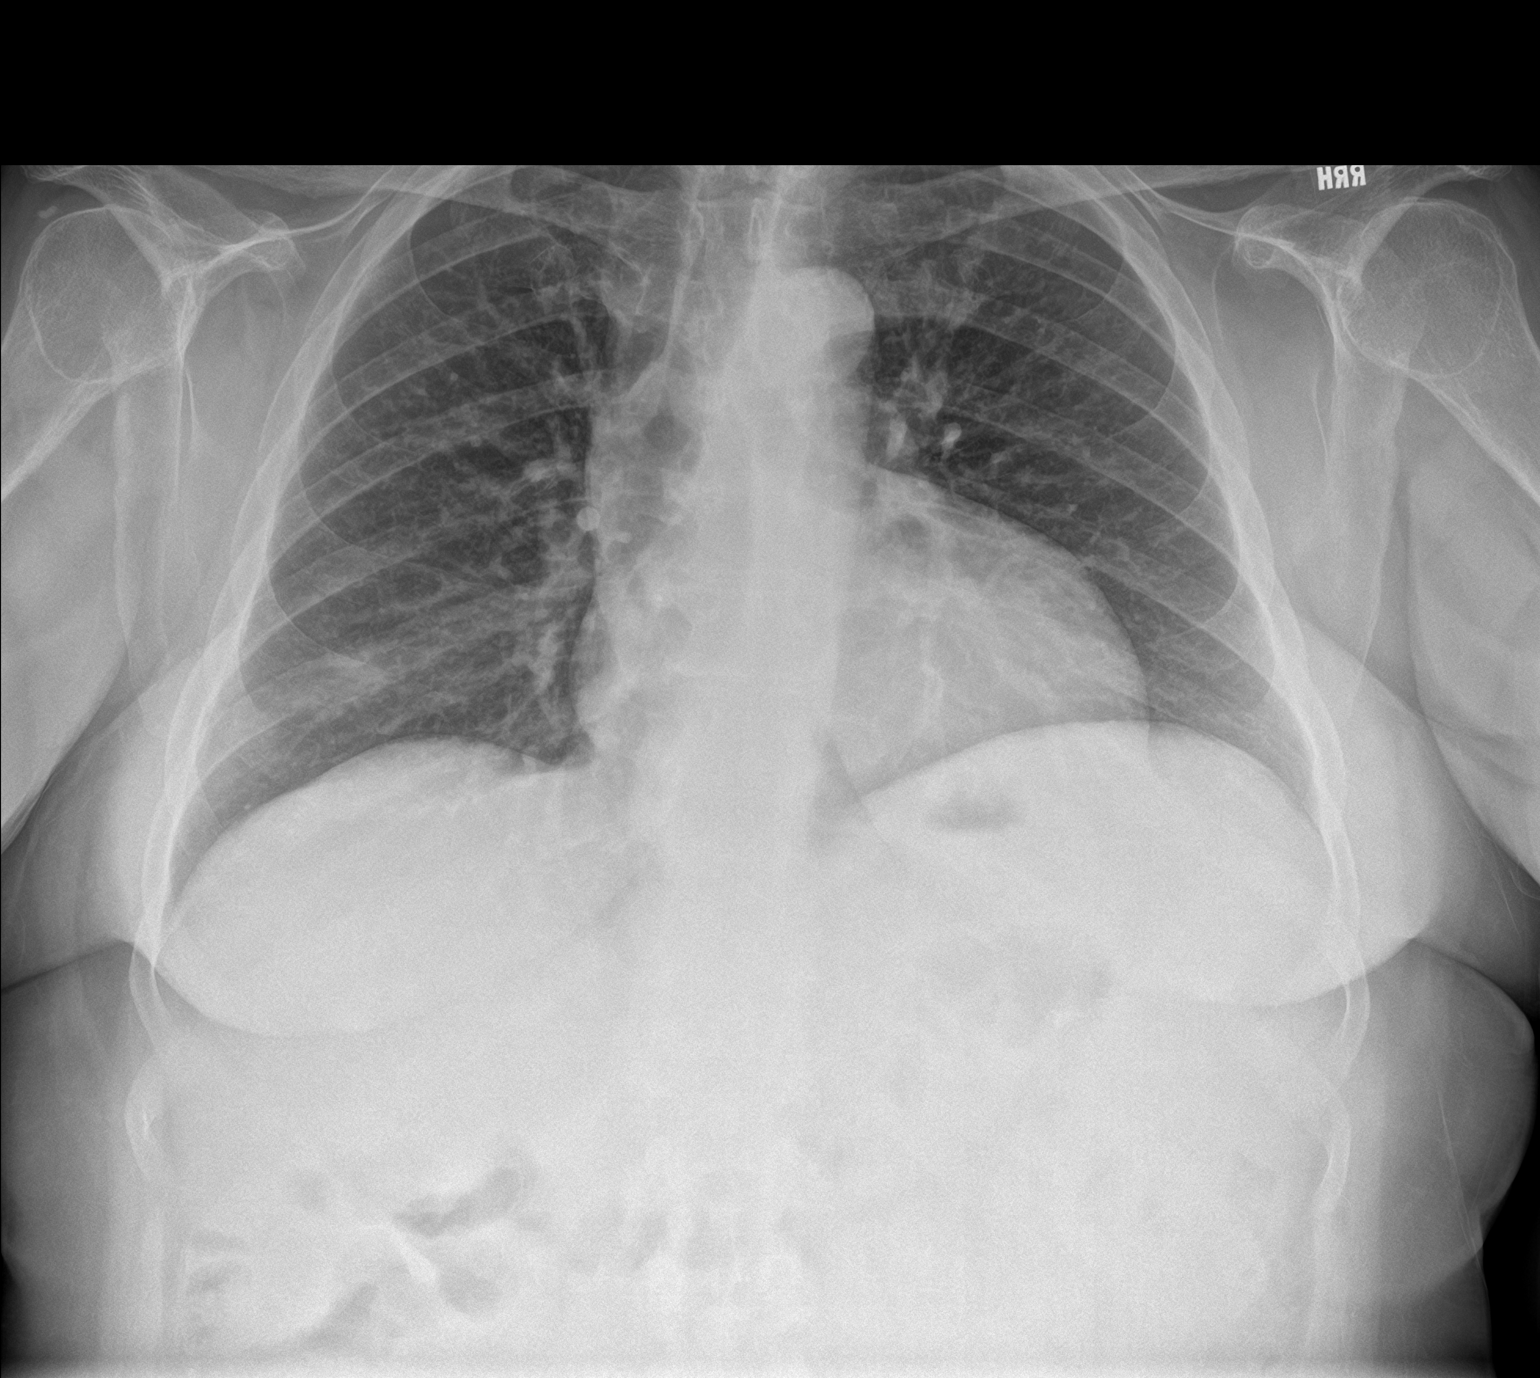

[chest lat]
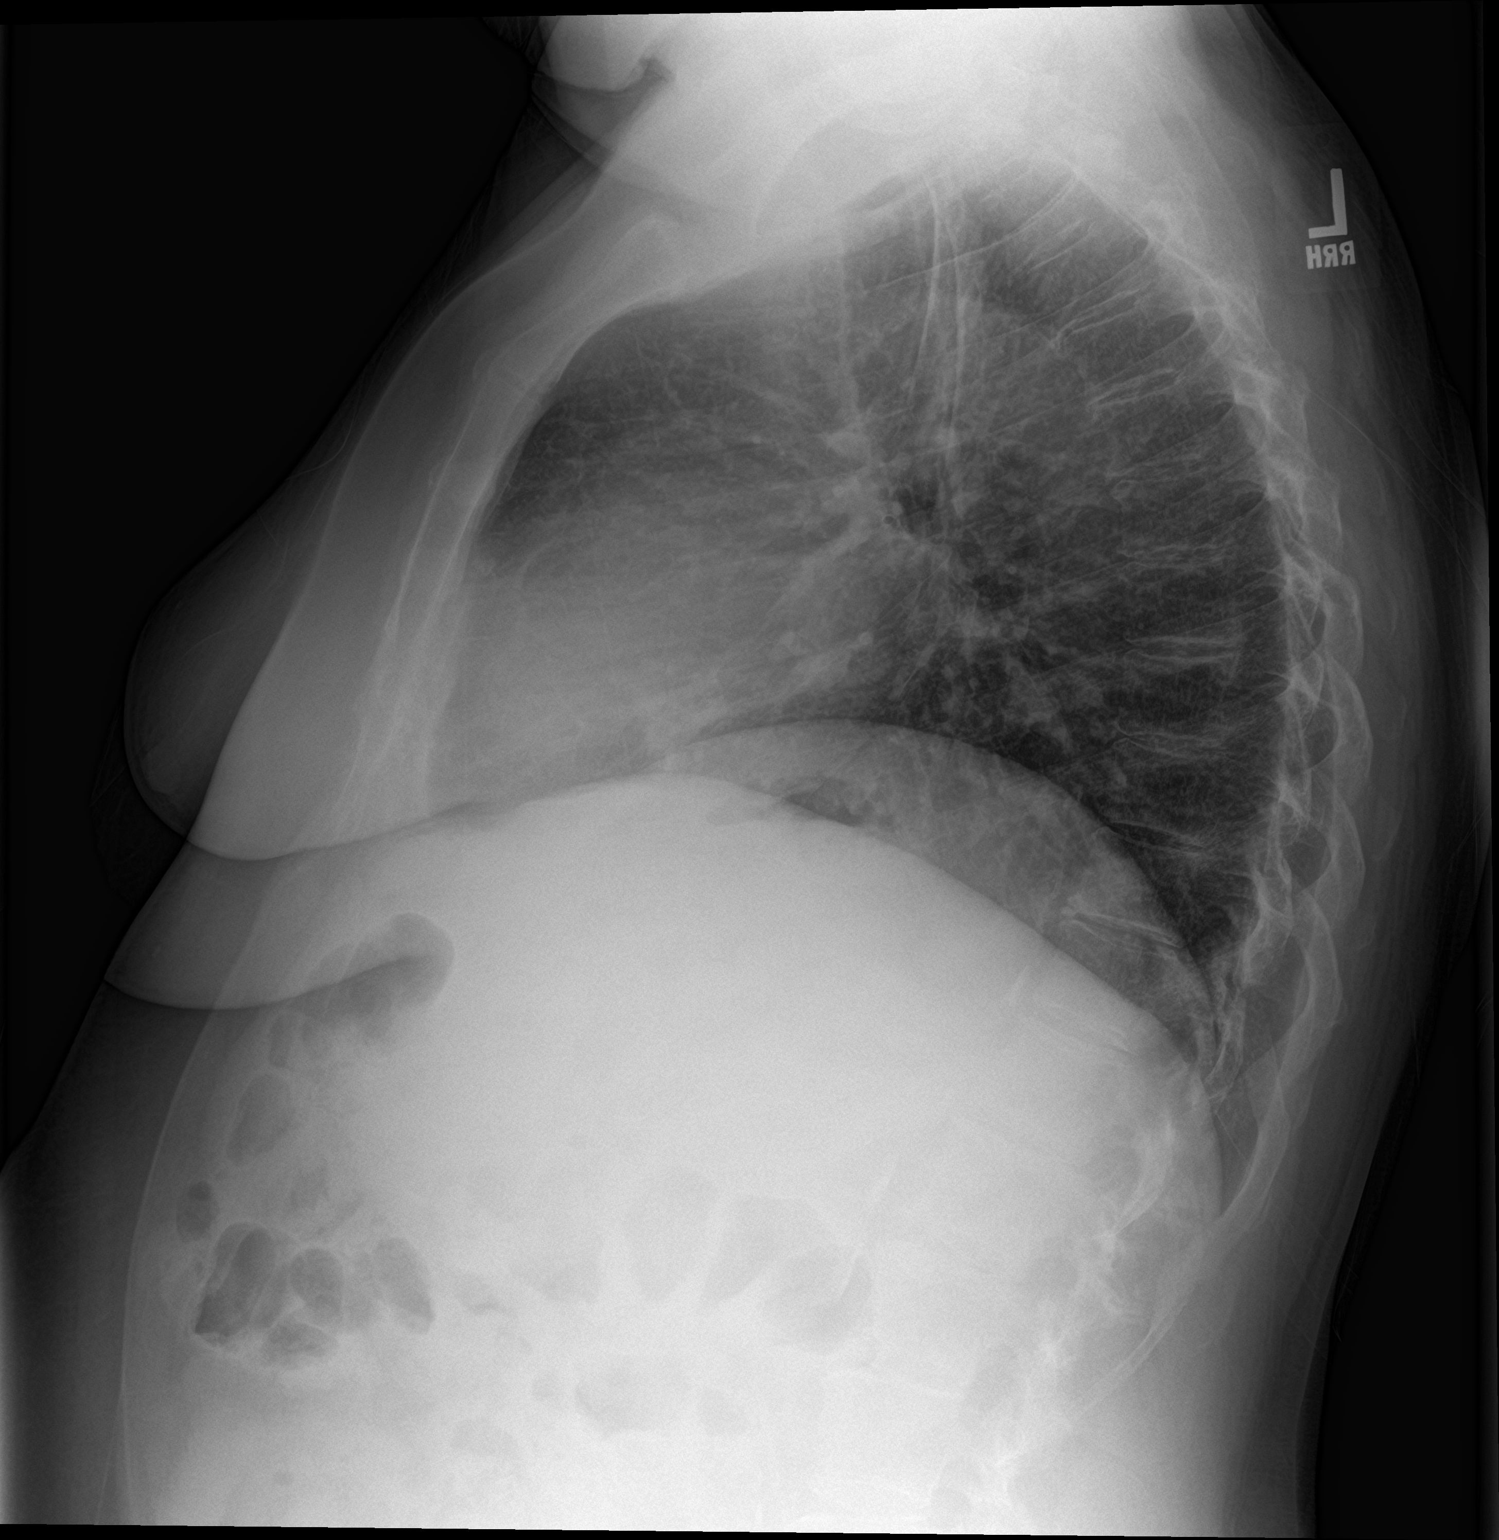

[2 of 2 positions shown; findings below may reference images not displayed]

FINDINGS: The lungs are mildly hypoinflated. The interstitial markings are
mildly increased. There is an area of confluence just above the
midportion of the right hemidiaphragm which is not entirely new. The
heart and pulmonary vascularity are normal. The mediastinum is
normal in width. No radiopaque surgical clip is clearly evident but
there is a focus of increased density in the left upper quadrant of
the abdomen that is poorly defined.
IMPRESSION: No definite metallic devices are noted in the thorax. There is a
tiny radiodensity in the left upper quadrant of the abdomen which is
not clearly evident on the accompanying abdominal series and may be
artifactual. No definite acute cardiopulmonary abnormality.

## 2019-04-12 IMAGING — DX DG ABDOMEN 1V
2 series · 2 of 2 positions shown · non-contrast
Comparison: Abdominal and pelvic CT scan December 19, 2016

CLINICAL DATA: Pre MRI study. Assess for metallic clip in the
colon.

EXAM:
ABDOMEN - 1 VIEW

[abdomen kub (1 of 2)]
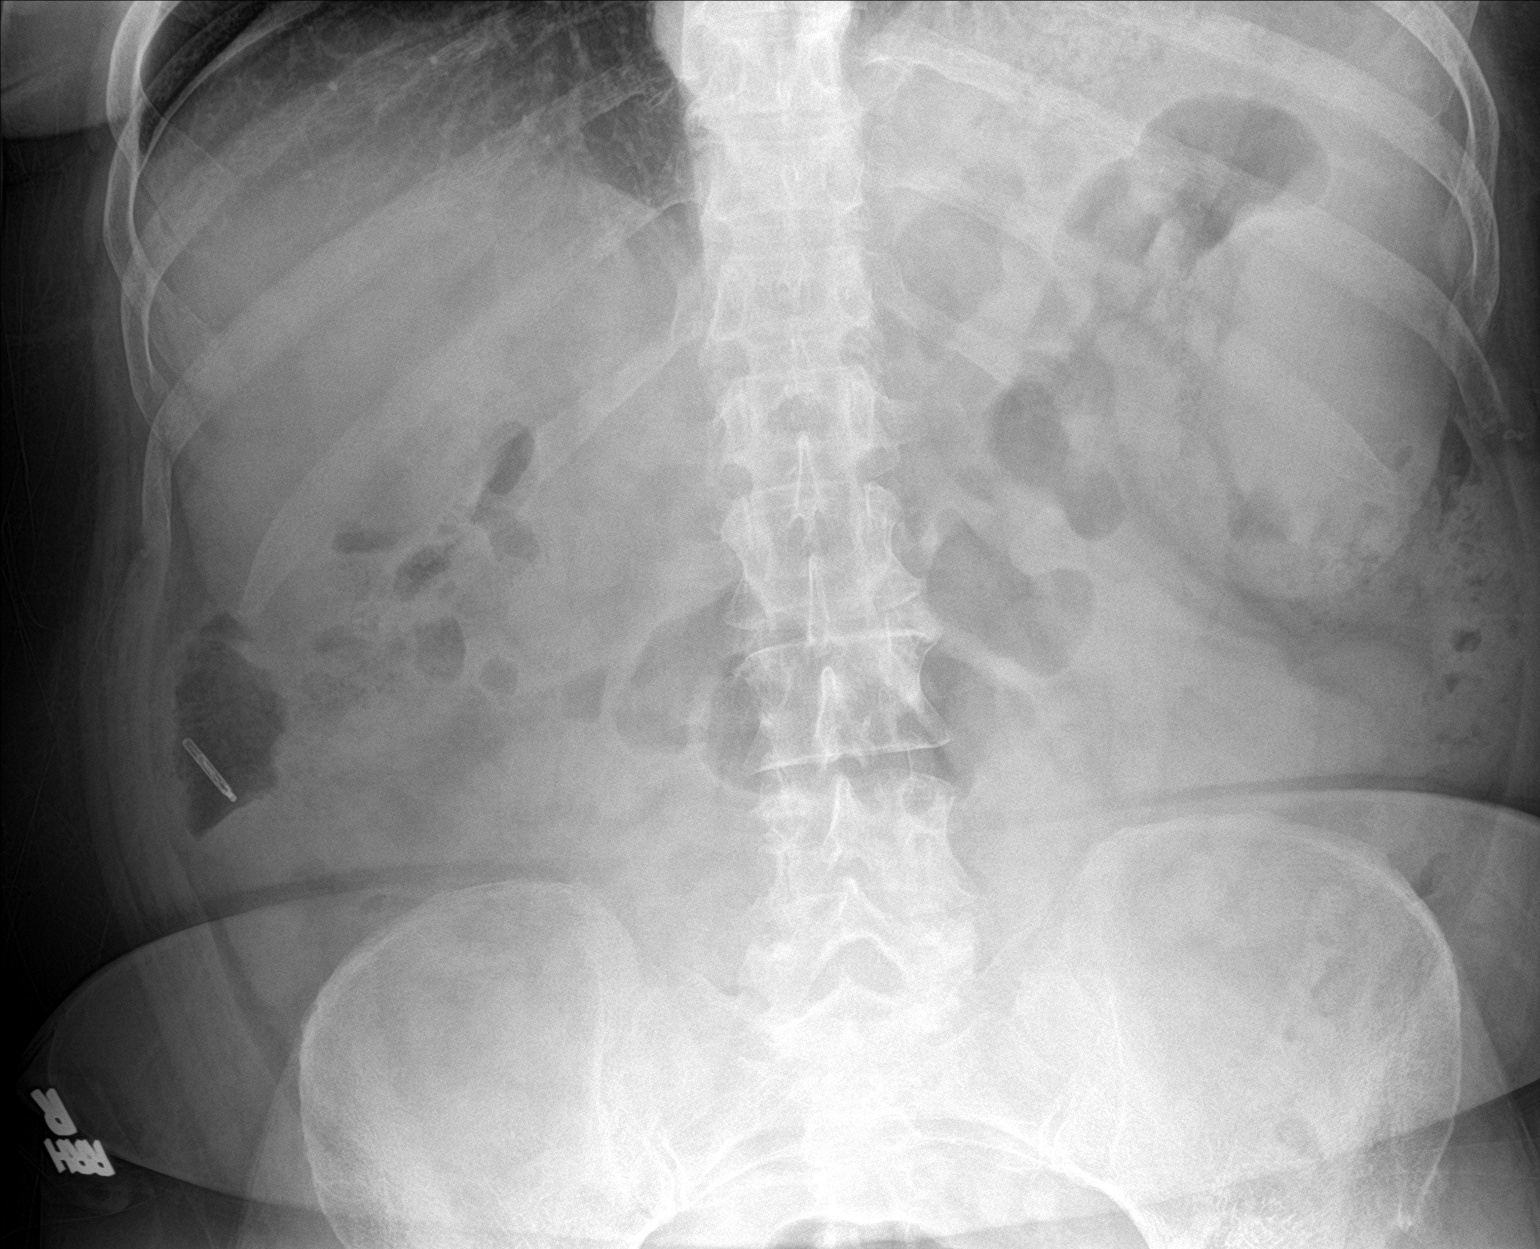

[abdomen kub (2 of 2)]
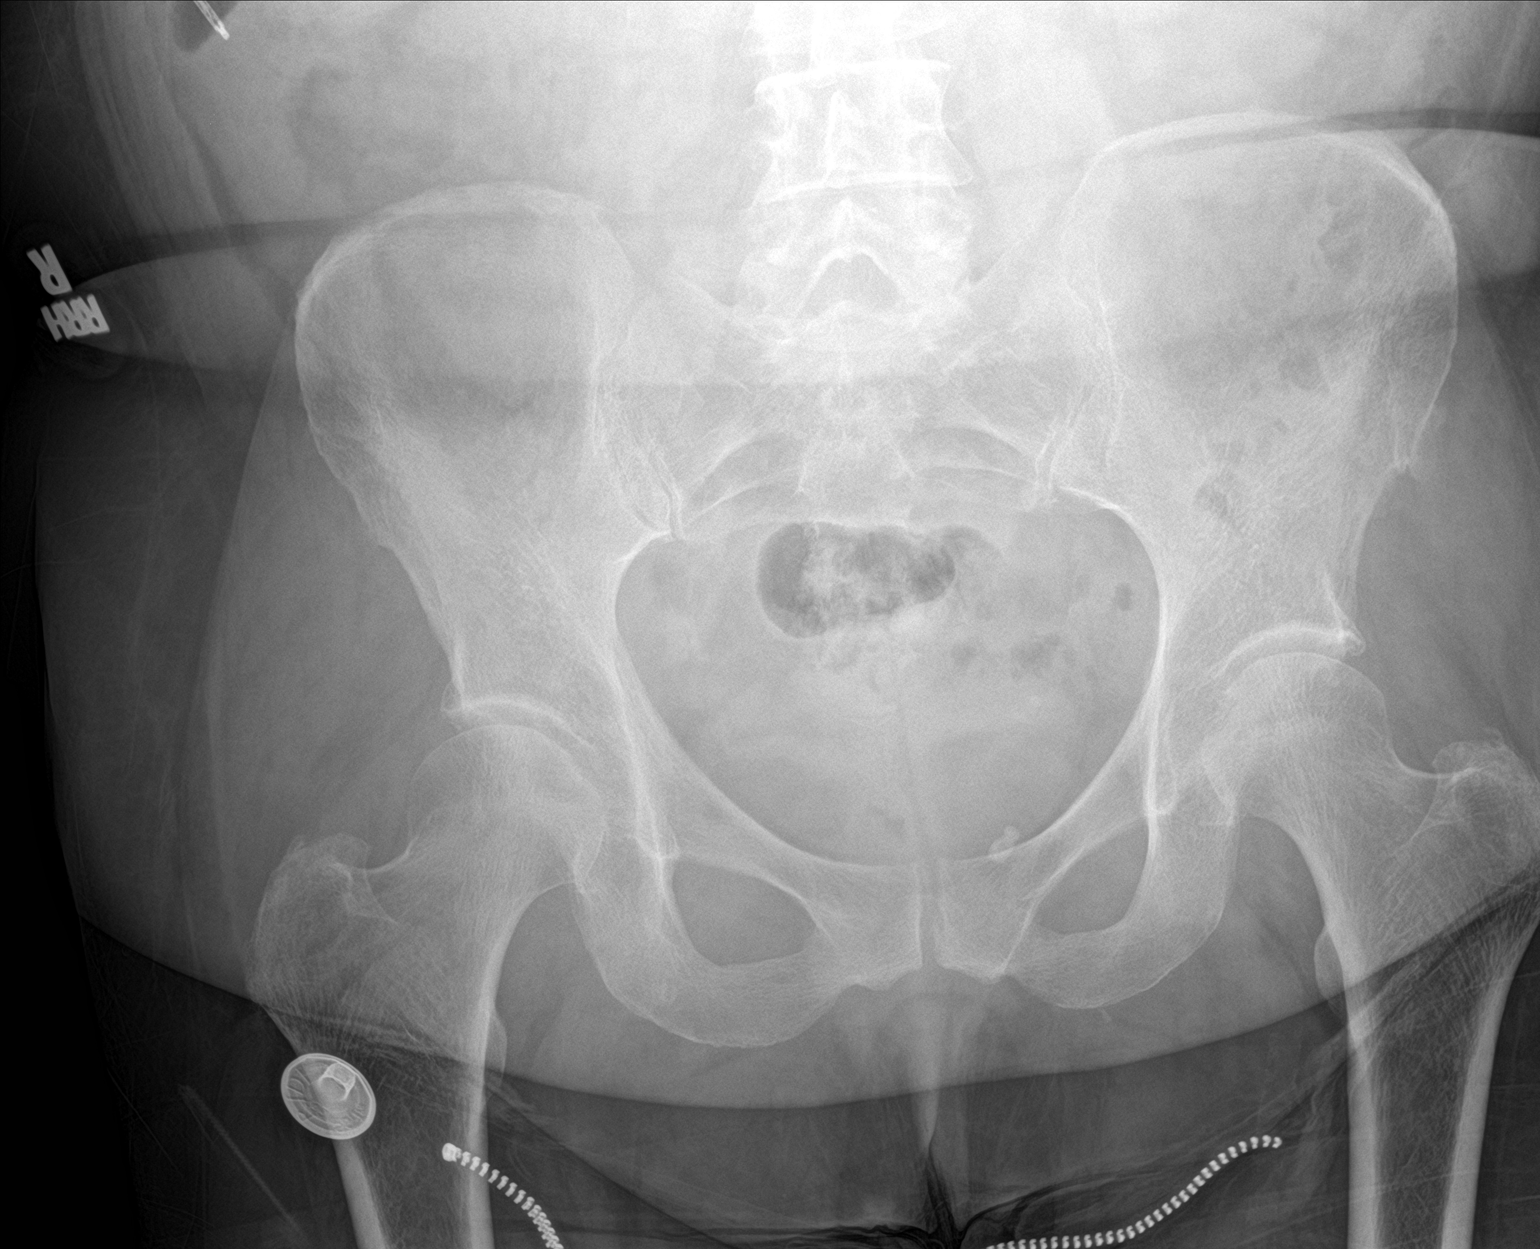

[2 of 2 positions shown; findings below may reference images not displayed]

FINDINGS: There is a metallic clip measuring 22 mm in length by approximately
2 mm in width that projects in the lateral aspect of the right mid
abdomen. It overlies stool and gas in the ascending colon. The bowel
gas pattern is normal. There are phleboliths within the pelvis.
There are mild degenerative changes of the lower lumbar spine.
IMPRESSION: There is a metallic clip-like device that projects in the mid
ascending colon. No acute intra-abdominal abnormality is observed.

## 2019-05-05 ENCOUNTER — Other Ambulatory Visit: Payer: Self-pay

## 2019-05-05 ENCOUNTER — Encounter (HOSPITAL_COMMUNITY): Payer: Self-pay

## 2019-05-05 ENCOUNTER — Inpatient Hospital Stay (HOSPITAL_COMMUNITY): Payer: Medicare Other | Attending: Oncology

## 2019-05-05 VITALS — BP 131/52 | HR 68 | Temp 97.7°F

## 2019-05-05 DIAGNOSIS — E538 Deficiency of other specified B group vitamins: Secondary | ICD-10-CM | POA: Diagnosis present

## 2019-05-05 DIAGNOSIS — Z79899 Other long term (current) drug therapy: Secondary | ICD-10-CM | POA: Diagnosis not present

## 2019-05-05 MED ORDER — CYANOCOBALAMIN 1000 MCG/ML IJ SOLN
INTRAMUSCULAR | Status: AC
  Filled 2019-05-05: qty 1

## 2019-05-05 MED ORDER — CYANOCOBALAMIN 1000 MCG/ML IJ SOLN
1000.0000 ug | Freq: Once | INTRAMUSCULAR | Status: AC
  Administered 2019-05-05: 1000 ug via INTRAMUSCULAR

## 2019-05-05 NOTE — Progress Notes (Signed)
B12 injection given today per orders.See MAR Patient tolerated it well without problems. Vitals stable and discharged home from clinic ambulatory. Follow up as scheduled.

## 2019-05-24 ENCOUNTER — Encounter: Payer: Self-pay | Admitting: Nutrition

## 2019-05-24 ENCOUNTER — Other Ambulatory Visit: Payer: Self-pay

## 2019-05-24 ENCOUNTER — Encounter: Payer: Medicare Other | Attending: Internal Medicine | Admitting: Nutrition

## 2019-05-24 VITALS — Ht 61.0 in | Wt 185.0 lb

## 2019-05-24 DIAGNOSIS — E119 Type 2 diabetes mellitus without complications: Secondary | ICD-10-CM | POA: Insufficient documentation

## 2019-05-24 DIAGNOSIS — K7581 Nonalcoholic steatohepatitis (NASH): Secondary | ICD-10-CM | POA: Insufficient documentation

## 2019-05-24 NOTE — Progress Notes (Signed)
.   Medical Nutrition Therapy:  Appt start time: 0800 end time:  0900.   Assessment:  Primary concerns today: Diabetes and CKD.. Lives with her husband. 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 He recently. She seen at [facility] for liver transplant.Most A1C was 8.7%..  Fatty  LIver. Suppose to be on a list for liver transplant but has to get A1C down. Desires to lose weight and improve her DM and comorbidities.Tyler Aas 26 units and  Victoza and Glipizide. FBS  120-150s.  Bedtime; Doesn't check BS at night.  She and her husband are working together.   CMP Latest Ref Rng & Units 03/30/2019 12/28/2018 09/28/2018  Glucose 70 - 99 mg/dL 175(H) 289(H) 189(H)  BUN 8 - 23 mg/dL 16 16 16   Creatinine 0.44 - 1.00 mg/dL 1.65(H) 1.79(H) 1.70(H)  Sodium 135 - 145 mmol/L 140 138 139  Potassium 3.5 - 5.1 mmol/L 4.3 4.6 4.1  Chloride 98 - 111 mmol/L 109 109 107  CO2 22 - 32 mmol/L 23 23 26   Calcium 8.9 - 10.3 mg/dL 9.2 9.1 8.9  Total Protein 6.5 - 8.1 g/dL 6.8 7.0 6.7  Total Bilirubin 0.3 - 1.2 mg/dL 3.5(H) 2.9(H) 2.9(H)  Alkaline Phos 38 - 126 U/L 77 84 60  AST 15 - 41 U/L 36 33 27  ALT 0 - 44 U/L 23 29 18     Preferred Learning Style:     No preference indicated   Learning Readiness:   Ready  Change in progress   MEDICATIONS:   DIETARY INTAKE:  24-hr recall:  B ( AM): eggs, toast, or cereal,  Snk ( AM): L ( PM):  Nabs and applesauce, Snk ( PM): D ( PM): Pizza 1-2 slices,  Snk ( PM): Beverages:  Usual physical activity:  Estimated energy needs: 1200  calories 135 g carbohydrates 90 g protein 50 g fat  Progress Towards Goal(s):  In progress.   Nutritional Diagnosis:  NB-1.1 Food and nutrition-related knowledge deficit As related to Diabetes and CKD and Obesity.  As evidenced by A1C 8.6%.    Intervention: Nutrition and Diabetes education provided on My Plate, CHO counting, meal planning, portion sizes, timing of meals, avoiding snacks between meals unless having a low blood sugar,  target ranges for A1C and blood sugars, signs/symptoms and treatment of hyper/hypoglycemia, monitoring blood sugars, taking medications as prescribed, benefits of exercising 30 minutes per day and prevention of complications of DM.  Goals Follow MY Plate Eat 3 meals a day at times discussed. No snacks Drink only water Walk 30 minutes a day Test BS twice a day; am and before bed Do not skip meals Eat 30-45 grams of carbs per meal.  .  Teaching Method Utilized:  Visual Auditory Hands on  Handouts given during visit include: Diabetes Plate Method, Diabetes Instructions, Meal Plans.   Barriers to learning/adherence to lifestyle change: none  Demonstrated degree of understanding via:  Teach Back   Monitoring/Evaluation:  Dietary intake, exercise, and body weight in 1 month(s) .

## 2019-05-24 NOTE — Patient Instructions (Signed)
Goals Follow MY Plate Eat 3 meals a day at times discussed. No snacks Drink only water Walk 30 minutes a day Test BS twice a day; am and before bed Do not skip meals Eat 30-45 grams of carbs per meal.

## 2019-06-02 ENCOUNTER — Inpatient Hospital Stay (HOSPITAL_COMMUNITY): Payer: Medicare Other | Attending: Oncology

## 2019-06-02 ENCOUNTER — Other Ambulatory Visit: Payer: Self-pay

## 2019-06-02 ENCOUNTER — Encounter (HOSPITAL_COMMUNITY): Payer: Self-pay

## 2019-06-02 VITALS — BP 125/59 | HR 65 | Temp 97.1°F | Resp 16

## 2019-06-02 DIAGNOSIS — Z79899 Other long term (current) drug therapy: Secondary | ICD-10-CM | POA: Insufficient documentation

## 2019-06-02 DIAGNOSIS — E538 Deficiency of other specified B group vitamins: Secondary | ICD-10-CM | POA: Insufficient documentation

## 2019-06-02 MED ORDER — CYANOCOBALAMIN 1000 MCG/ML IJ SOLN
1000.0000 ug | Freq: Once | INTRAMUSCULAR | Status: AC
  Administered 2019-06-02: 1000 ug via INTRAMUSCULAR

## 2019-06-02 NOTE — Progress Notes (Signed)
Patient tolerated injection with no complaints voiced.  Site clean and dry with no bruising or swelling noted at site.  Band aid applied.  Vss with discharge and left ambulatory with no s/s of distress noted.  

## 2019-06-23 ENCOUNTER — Other Ambulatory Visit (HOSPITAL_COMMUNITY): Payer: Self-pay

## 2019-06-23 DIAGNOSIS — D696 Thrombocytopenia, unspecified: Secondary | ICD-10-CM

## 2019-06-23 DIAGNOSIS — D500 Iron deficiency anemia secondary to blood loss (chronic): Secondary | ICD-10-CM

## 2019-06-23 DIAGNOSIS — E538 Deficiency of other specified B group vitamins: Secondary | ICD-10-CM

## 2019-06-26 ENCOUNTER — Other Ambulatory Visit: Payer: Self-pay

## 2019-06-26 ENCOUNTER — Inpatient Hospital Stay (HOSPITAL_COMMUNITY): Payer: Medicare Other | Attending: Hematology

## 2019-06-26 DIAGNOSIS — D731 Hypersplenism: Secondary | ICD-10-CM | POA: Diagnosis not present

## 2019-06-26 DIAGNOSIS — K766 Portal hypertension: Secondary | ICD-10-CM | POA: Insufficient documentation

## 2019-06-26 DIAGNOSIS — K219 Gastro-esophageal reflux disease without esophagitis: Secondary | ICD-10-CM | POA: Insufficient documentation

## 2019-06-26 DIAGNOSIS — D696 Thrombocytopenia, unspecified: Secondary | ICD-10-CM

## 2019-06-26 DIAGNOSIS — D61818 Other pancytopenia: Secondary | ICD-10-CM | POA: Insufficient documentation

## 2019-06-26 DIAGNOSIS — E538 Deficiency of other specified B group vitamins: Secondary | ICD-10-CM | POA: Diagnosis not present

## 2019-06-26 DIAGNOSIS — K2270 Barrett's esophagus without dysplasia: Secondary | ICD-10-CM | POA: Insufficient documentation

## 2019-06-26 DIAGNOSIS — D509 Iron deficiency anemia, unspecified: Secondary | ICD-10-CM | POA: Diagnosis not present

## 2019-06-26 DIAGNOSIS — N189 Chronic kidney disease, unspecified: Secondary | ICD-10-CM | POA: Diagnosis not present

## 2019-06-26 DIAGNOSIS — I10 Essential (primary) hypertension: Secondary | ICD-10-CM | POA: Insufficient documentation

## 2019-06-26 DIAGNOSIS — K3189 Other diseases of stomach and duodenum: Secondary | ICD-10-CM | POA: Diagnosis not present

## 2019-06-26 DIAGNOSIS — E785 Hyperlipidemia, unspecified: Secondary | ICD-10-CM | POA: Insufficient documentation

## 2019-06-26 DIAGNOSIS — R7989 Other specified abnormal findings of blood chemistry: Secondary | ICD-10-CM | POA: Diagnosis not present

## 2019-06-26 DIAGNOSIS — K7460 Unspecified cirrhosis of liver: Secondary | ICD-10-CM | POA: Diagnosis not present

## 2019-06-26 DIAGNOSIS — E1122 Type 2 diabetes mellitus with diabetic chronic kidney disease: Secondary | ICD-10-CM | POA: Diagnosis not present

## 2019-06-26 DIAGNOSIS — D500 Iron deficiency anemia secondary to blood loss (chronic): Secondary | ICD-10-CM

## 2019-06-26 DIAGNOSIS — Z79899 Other long term (current) drug therapy: Secondary | ICD-10-CM | POA: Diagnosis not present

## 2019-06-26 DIAGNOSIS — I129 Hypertensive chronic kidney disease with stage 1 through stage 4 chronic kidney disease, or unspecified chronic kidney disease: Secondary | ICD-10-CM | POA: Diagnosis not present

## 2019-06-26 LAB — COMPREHENSIVE METABOLIC PANEL
ALT: 22 U/L (ref 0–44)
AST: 27 U/L (ref 15–41)
Albumin: 3.6 g/dL (ref 3.5–5.0)
Alkaline Phosphatase: 73 U/L (ref 38–126)
Anion gap: 9 (ref 5–15)
BUN: 26 mg/dL — ABNORMAL HIGH (ref 8–23)
CO2: 21 mmol/L — ABNORMAL LOW (ref 22–32)
Calcium: 9.1 mg/dL (ref 8.9–10.3)
Chloride: 110 mmol/L (ref 98–111)
Creatinine, Ser: 1.92 mg/dL — ABNORMAL HIGH (ref 0.44–1.00)
GFR calc Af Amer: 30 mL/min — ABNORMAL LOW (ref >60)
GFR calc non Af Amer: 26 mL/min — ABNORMAL LOW (ref >60)
Glucose, Bld: 232 mg/dL — ABNORMAL HIGH (ref 70–99)
Potassium: 5.1 mmol/L (ref 3.5–5.1)
Sodium: 140 mmol/L (ref 135–145)
Total Bilirubin: 2.3 mg/dL — ABNORMAL HIGH (ref 0.3–1.2)
Total Protein: 6.7 g/dL (ref 6.5–8.1)

## 2019-06-26 LAB — CBC WITH DIFFERENTIAL/PLATELET
Abs Immature Granulocytes: 0 10*3/uL (ref 0.00–0.07)
Basophils Absolute: 0 10*3/uL (ref 0.0–0.1)
Basophils Relative: 0 %
Eosinophils Absolute: 0.1 10*3/uL (ref 0.0–0.5)
Eosinophils Relative: 3 %
HCT: 32.5 % — ABNORMAL LOW (ref 36.0–46.0)
Hemoglobin: 10.9 g/dL — ABNORMAL LOW (ref 12.0–15.0)
Immature Granulocytes: 0 %
Lymphocytes Relative: 29 %
Lymphs Abs: 1 10*3/uL (ref 0.7–4.0)
MCH: 30.7 pg (ref 26.0–34.0)
MCHC: 33.5 g/dL (ref 30.0–36.0)
MCV: 91.5 fL (ref 80.0–100.0)
Monocytes Absolute: 0.2 10*3/uL (ref 0.1–1.0)
Monocytes Relative: 7 %
Neutro Abs: 2.1 10*3/uL (ref 1.7–7.7)
Neutrophils Relative %: 61 %
Platelets: 47 10*3/uL — ABNORMAL LOW (ref 150–400)
RBC: 3.55 MIL/uL — ABNORMAL LOW (ref 3.87–5.11)
RDW: 14.8 % (ref 11.5–15.5)
WBC: 3.4 10*3/uL — ABNORMAL LOW (ref 4.0–10.5)
nRBC: 0 % (ref 0.0–0.2)

## 2019-06-26 LAB — VITAMIN B12: Vitamin B-12: 902 pg/mL (ref 180–914)

## 2019-06-26 LAB — LACTATE DEHYDROGENASE: LDH: 156 U/L (ref 98–192)

## 2019-06-26 LAB — IRON AND TIBC
Iron: 86 ug/dL (ref 28–170)
Saturation Ratios: 32 % — ABNORMAL HIGH (ref 10.4–31.8)
TIBC: 268 ug/dL (ref 250–450)
UIBC: 182 ug/dL

## 2019-06-26 LAB — FOLATE: Folate: 16.2 ng/mL (ref >5.9)

## 2019-06-26 LAB — FERRITIN: Ferritin: 222 ng/mL (ref 11–307)

## 2019-06-26 LAB — VITAMIN D 25 HYDROXY (VIT D DEFICIENCY, FRACTURES): Vit D, 25-Hydroxy: 33.57 ng/mL (ref 30–100)

## 2019-06-30 ENCOUNTER — Other Ambulatory Visit: Payer: Self-pay

## 2019-07-03 ENCOUNTER — Inpatient Hospital Stay (HOSPITAL_COMMUNITY): Payer: Medicare Other | Admitting: Hematology

## 2019-07-03 ENCOUNTER — Encounter (HOSPITAL_COMMUNITY): Payer: Self-pay | Admitting: Hematology

## 2019-07-03 ENCOUNTER — Inpatient Hospital Stay (HOSPITAL_COMMUNITY): Payer: Medicare Other

## 2019-07-03 ENCOUNTER — Other Ambulatory Visit: Payer: Self-pay

## 2019-07-03 DIAGNOSIS — D500 Iron deficiency anemia secondary to blood loss (chronic): Secondary | ICD-10-CM | POA: Diagnosis not present

## 2019-07-03 DIAGNOSIS — E538 Deficiency of other specified B group vitamins: Secondary | ICD-10-CM

## 2019-07-03 DIAGNOSIS — D61818 Other pancytopenia: Secondary | ICD-10-CM | POA: Diagnosis not present

## 2019-07-03 MED ORDER — CYANOCOBALAMIN 1000 MCG/ML IJ SOLN
1000.0000 ug | Freq: Once | INTRAMUSCULAR | Status: AC
  Administered 2019-07-03: 1000 ug via INTRAMUSCULAR
  Filled 2019-07-03: qty 1

## 2019-07-03 MED ORDER — CYANOCOBALAMIN 1000 MCG/ML IJ SOLN
INTRAMUSCULAR | Status: AC
  Filled 2019-07-03: qty 1

## 2019-07-03 NOTE — Patient Instructions (Signed)
[facility] at [facility] Discharge Instructions  Received Vit B12 injection today. Follow-up as scheduled. Call clinic for any questions or concerns   Thank you for choosing [facility] at [facility] to provide your oncology and hematology care.  To afford each patient quality time with our provider, please arrive at least 15 minutes before your scheduled appointment time.   If you have a lab appointment with the Cancer Center please come in thru the Main Entrance and check in at the main information desk.  You need to re-schedule your appointment should you arrive 10 or more minutes late.  We strive to give you quality time with our providers, and arriving late affects you and other patients whose appointments are after yours.  Also, if you no show three or more times for appointments you may be dismissed from the clinic at the providers discretion.     Again, thank you for choosing [facility].  Our hope is that these requests will decrease the amount of time that you wait before being seen by our physicians.       _____________________________________________________________  Should you have questions after your visit to [facility], please contact our office at [phone] between the hours of 8:00 a.m. and 4:30 p.m.  Voicemails left after 4:00 p.m. will not be returned until the following business day.  For prescription refill requests, have your pharmacy contact our office and allow 72 hours.    Due to Covid, you will need to wear a mask upon entering the hospital. If you do not have a mask, a mask will be given to you at the Main Entrance upon arrival. For doctor visits, patients may have 1 support person with them. For treatment visits, patients can not have anyone with them due to social distancing guidelines and our immunocompromised population.     

## 2019-07-03 NOTE — Progress Notes (Signed)
Alisha [name] tolerated Vit B12 injection well without complaints or incident. VSS Pt discharged self ambulatory in satisfactory condition

## 2019-07-03 NOTE — Progress Notes (Signed)
Alisha [name], [place] [id number]   CLINIC:  Medical Oncology/Hematology  PCP:  Glenda Chroman, MD [address] [place] [address] [phone]   REASON FOR VISIT:  Follow-up for Pancytopenia   CURRENT THERAPY: Clinical surveillance   INTERVAL HISTORY:  Alisha [name] 68 y.o. female presents today for follow-up.  Reports overall doing well.  She denies any significant fatigue.  She actually states she is feeling better.  She denies any changes in her bowel habits.  Appetite is stable.  She is being followed by nutritionist.  Denies any obvious signs of bleeding.  Denies any chest pain or shortness of breath.  She is here for repeat labs and office visit.   REVIEW OF SYSTEMS:  Review of Systems  Constitutional: Negative.   HENT:  Negative.   Eyes: Negative.   Respiratory: Negative.   Cardiovascular: Negative.   Gastrointestinal: Negative.   Endocrine: Negative.   Genitourinary: Negative.    Musculoskeletal: Negative.   Skin: Negative.   Neurological: Negative.   Hematological: Negative.   Psychiatric/Behavioral: Negative.      PAST MEDICAL/SURGICAL HISTORY:  Past Medical History:  Diagnosis Date  . B12 deficiency 11/03/2016  . B12 deficiency 11/03/2016  . Barrett's esophagus   . Cirrhosis of liver without mention of alcohol    hep B surface antigen and HCV ab negative.pt has not had hepatitis A and B vaccines.U/S on 07/04/13 shows cirrhosis.  . Diverticula of colon    pancolonic  . Esophageal reflux   . Esophageal varices ([place])   . Hiatal hernia   . Iron deficiency anemia, unspecified   . Other and unspecified hyperlipidemia   . Portal hypertensive gastropathy ([place])   . Tubular adenoma   . Type II or unspecified type diabetes mellitus without mention of complication, not stated as uncontrolled   . Unspecified essential hypertension   . Unspecified hemorrhoids without mention of complication    Past Surgical History:  Procedure  Laterality Date  . ABDOMINAL HYSTERECTOMY    . BIOPSY  04/30/2016   Procedure: BIOPSY;  Surgeon: Daneil Dolin, MD;  [facility];  Service: Endoscopy;;  esophagus  . BIOPSY  02/24/2019   Procedure: BIOPSY;  Surgeon: Daneil Dolin, MD;  [facility];  Service: Endoscopy;;  . CESAREAN SECTION     x 2  . COLONOSCOPY  03/2006   left sided diverticula, splenic flexure tublar adenoma  . COLONOSCOPY  07/2002   villous tubular adenoma in rectum  . COLONOSCOPY  09/23/09   external hemorrhoids/scattered pan colonic diverticula otherwise normal. cecal lipoma bx negative, normal TI. Next TCS 09/2014  . COLONOSCOPY N/A 11/12/2014   Procedure: COLONOSCOPY;  Surgeon: Daneil Dolin, MD;  [facility];  Service: Endoscopy;  Laterality: N/A;  1215 - moved to 12:30 - Ginger to notify pt  . COLONOSCOPY N/A 01/07/2017   Procedure: COLONOSCOPY;  Surgeon: Daneil Dolin, MD;  [facility];  Service: Endoscopy;  Laterality: N/A;  3:15PM  . ESOPHAGEAL BANDING N/A 12/20/2016   Procedure: ESOPHAGEAL BANDING;  Surgeon: Danie Binder, MD;  [facility];  Service: Endoscopy;  Laterality: N/A;  . ESOPHAGEAL BANDING N/A 03/18/2017   Procedure: ESOPHAGEAL BANDING;  Surgeon: Daneil Dolin, MD;  [facility];  Service: Endoscopy;  Laterality: N/A;  . ESOPHAGEAL BANDING  12/17/2017   Procedure: ESOPHAGEAL BANDING;  Surgeon: Daneil Dolin, MD;  [facility];  Service: Endoscopy;;  . ESOPHAGOGASTRODUODENOSCOPY  08/2006   barretts esophagus, no dyplasia  . ESOPHAGOGASTRODUODENOSCOPY  03/2006   barretts esophagus,bx focal atypia c/w low grade dysplasia, SB bx negative for celiac  . ESOPHAGOGASTRODUODENOSCOPY  09/23/09   3 columns of grade 2 esophageal varices/hiatal hernia/3-4 cm segment Barrett's esophagus without dysplasia. Next EGD 09/2012  . ESOPHAGOGASTRODUODENOSCOPY N/A 11/11/2012   Dr. Gala Romney- Barrett;s esophagus, esophageal varices, portal  gastopathy. hiatal hernia  . ESOPHAGOGASTRODUODENOSCOPY N/A 04/30/2016   Procedure: ESOPHAGOGASTRODUODENOSCOPY (EGD);  Surgeon: Daneil Dolin, MD;  [facility];  Service: Endoscopy;  Laterality: N/A;  815  . ESOPHAGOGASTRODUODENOSCOPY N/A 12/20/2016   Procedure: ESOPHAGOGASTRODUODENOSCOPY (EGD);  Surgeon: Danie Binder, MD;  [facility];  Service: Endoscopy;  Laterality: N/A;  . ESOPHAGOGASTRODUODENOSCOPY N/A 01/07/2017   Procedure: ESOPHAGOGASTRODUODENOSCOPY (EGD);  Surgeon: Daneil Dolin, MD;  [facility];  Service: Endoscopy;  Laterality: N/A;  . ESOPHAGOGASTRODUODENOSCOPY N/A 03/18/2017   Procedure: ESOPHAGOGASTRODUODENOSCOPY (EGD);  Surgeon: Daneil Dolin, MD;  [facility];  Service: Endoscopy;  Laterality: N/A;  1030   . ESOPHAGOGASTRODUODENOSCOPY N/A 12/17/2017   Procedure: ESOPHAGOGASTRODUODENOSCOPY (EGD);  Surgeon: Daneil Dolin, MD;  [facility];  Service: Endoscopy;  Laterality: N/A;  10:30am  . ESOPHAGOGASTRODUODENOSCOPY N/A 02/24/2019   Procedure: ESOPHAGOGASTRODUODENOSCOPY (EGD);  Surgeon: Daneil Dolin, MD;  [facility];  Service: Endoscopy;  Laterality: N/A;  7:30am  . HEMORRHOID SURGERY  2008  . small bowel capsule endoscopy  10/2009   normal     SOCIAL HISTORY:  Social History   Socioeconomic History  . Marital status: Married    Spouse name: Not on file  . Number of children: Not on file  . Years of education: Not on file  . Highest education level: Not on file  Occupational History  . Not on file  Social Needs  . Financial resource strain: Not on file  . Food insecurity    Worry: Not on file    Inability: Not on file  . Transportation needs    Medical: Not on file    Non-medical: Not on file  Tobacco Use  . Smoking status: Never Smoker  . Smokeless tobacco: Never Used  Substance and Sexual Activity  . Alcohol use: No  . Drug use: No  . Sexual activity: Yes  Lifestyle  . Physical  activity    Days per week: Not on file    Minutes per session: Not on file  . Stress: Not on file  Relationships  . Social Herbalist on phone: Not on file    Gets together: Not on file    Attends religious service: Not on file    Active member of club or organization: Not on file    Attends meetings of clubs or organizations: Not on file    Relationship status: Not on file  . Intimate partner violence    Fear of current or ex partner: Not on file    Emotionally abused: Not on file    Physically abused: Not on file    Forced sexual activity: Not on file  Other Topics Concern  . Not on file  Social History Narrative  . Not on file    FAMILY HISTORY:  Family History  Problem Relation Age of Onset  . Alzheimer's disease Mother   . CAD Father   . Diabetes Mellitus II Father   . Alzheimer's disease Father   .  Diabetes Mellitus II Sister   . Colon cancer Sister 81       small, surgical excision; chemo/rad not needed  . Cancer - Other Brother   . Diabetes Mellitus II Brother   . Hypertension Brother   . Liver disease Neg Hx     CURRENT MEDICATIONS:  Outpatient Encounter Medications as of 07/03/2019  Medication Sig Note  . ACCU-CHEK AVIVA PLUS test strip USE ONE STRIP TWICE DAILY   . ACCU-CHEK SOFTCLIX LANCETS lancets USE TO CHECK BLOOD SUGAR TWICE DAILY.   Marland Kitchen Cholecalciferol (VITAMIN D) 2000 units tablet Take 2,000 Units by mouth daily.   . cyanocobalamin (,VITAMIN B-12,) 1000 MCG/ML injection Inject 1,000 mcg into the muscle every 30 (thirty) days.  12/13/2018: LAST DOSE:12/07/2018  . GLIPIZIDE XL 10 MG 24 hr tablet Take 10 mg by mouth daily.    Marland Kitchen lisinopril (PRINIVIL,ZESTRIL) 10 MG tablet Take 10 mg by mouth daily.    Marland Kitchen NOVOFINE 32G X 6 MM MISC USE ONE AS DIRECTED TWICE DAILY.   Marland Kitchen NOVOTWIST 32G X 5 MM MISC USE ONE AS DIRECTED TWICE DAILY.   . pantoprazole (PROTONIX) 40 MG tablet Take 1 tablet (40 mg total) by mouth 2 (two) times daily before a meal.   .  propranolol (INDERAL) 10 MG tablet TAKE THREE (3) TABLETS BY MOUTH EVERY MORNING AND 3 TABLETS EVERY EVENING.   Tyler Aas FLEXTOUCH 200 UNIT/ML SOPN Inject 26-44 Units into the skin at bedtime. Uses sliding scale once a day   . VICTOZA 18 MG/3ML SOLN Inject 1.8 mg into the skin daily.    . [DISCONTINUED] Insulin Degludec 200 UNIT/ML SOPN Inject into the skin.   . [EXPIRED] cyanocobalamin ((VITAMIN B-12)) injection 1,000 mcg     No facility-administered encounter medications on file as of 07/03/2019.     ALLERGIES:  Allergies  Allergen Reactions  . Codeine Nausea And Vomiting     PHYSICAL EXAM:  ECOG Performance status: 1  Vitals:   07/03/19 1028  BP: (!) 151/60  Pulse: 72  Resp: 20  Temp: (!) 97.5 F (36.4 C)  SpO2: 100%   Filed Weights   07/03/19 1028  Weight: 189 lb 3.2 oz (85.8 kg)    Physical Exam Constitutional:      Appearance: Normal appearance.  HENT:     Head: Normocephalic.     Right Ear: External ear normal.     Left Ear: External ear normal.     Nose: Nose normal.     Mouth/Throat:     Mouth: Mucous membranes are moist.     Pharynx: Oropharynx is clear.  Eyes:     Conjunctiva/sclera: Conjunctivae normal.  Neck:     Musculoskeletal: Normal range of motion.  Cardiovascular:     Rate and Rhythm: Normal rate and regular rhythm.     Pulses: Normal pulses.     Heart sounds: Normal heart sounds.  Pulmonary:     Effort: Pulmonary effort is normal.     Breath sounds: Normal breath sounds.  Abdominal:     General: Bowel sounds are normal.  Musculoskeletal:        General: Swelling present.  Skin:    General: Skin is warm.  Neurological:     General: No focal deficit present.     Mental Status: She is alert and oriented to person, place, and time.  Psychiatric:        Mood and Affect: Mood normal.        Behavior: Behavior normal.  Thought Content: Thought content normal.        Judgment: Judgment normal.      LABORATORY DATA:  I have  reviewed the labs as listed.  CBC    Component Value Date/Time   WBC 3.4 (L) 06/26/2019 1107   RBC 3.55 (L) 06/26/2019 1107   HGB 10.9 (L) 06/26/2019 1107   HCT 32.5 (L) 06/26/2019 1107   PLT 47 (L) 06/26/2019 1107   MCV 91.5 06/26/2019 1107   MCH 30.7 06/26/2019 1107   MCHC 33.5 06/26/2019 1107   RDW 14.8 06/26/2019 1107   LYMPHSABS 1.0 06/26/2019 1107   MONOABS 0.2 06/26/2019 1107   EOSABS 0.1 06/26/2019 1107   BASOSABS 0.0 06/26/2019 1107   CMP Latest Ref Rng & Units 06/26/2019 03/30/2019 12/28/2018  Glucose 70 - 99 mg/dL 232(H) 175(H) 289(H)  BUN 8 - 23 mg/dL 26(H) 16 16  Creatinine 0.44 - 1.00 mg/dL 1.92(H) 1.65(H) 1.79(H)  Sodium 135 - 145 mmol/L 140 140 138  Potassium 3.5 - 5.1 mmol/L 5.1 4.3 4.6  Chloride 98 - 111 mmol/L 110 109 109  CO2 22 - 32 mmol/L 21(L) 23 23  Calcium 8.9 - 10.3 mg/dL 9.1 9.2 9.1  Total Protein 6.5 - 8.1 g/dL 6.7 6.8 7.0  Total Bilirubin 0.3 - 1.2 mg/dL 2.3(H) 3.5(H) 2.9(H)  Alkaline Phos 38 - 126 U/L 73 77 84  AST 15 - 41 U/L 27 36 33  ALT 0 - 44 U/L 22 23 29         ASSESSMENT & PLAN:   Iron deficiency anemia 1.  Normocytic anemia: -This is from a combination of CKD, chronic blood loss, and iron deficiency. -Last Feraheme was on 12/24/2017 and 01/03/2018. -Hemoglobin is stable at 10.9, iron 86, TIBC 268, saturation percent 32, ferritin 222.  Patient denies any fatigue.  We will hold off on any parenteral iron at this time and continue to monitor her hemoglobin. -Return to clinic in 3 months.  2.  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 - Platelet count is more or less stable and stays around 50.   3.  Elevated creatinine: - This is stable at this time we will continue to monitor.  She does follow with nephrology.  4.  Cirrhosis: - She follows up with the liver transplant program at [facility]. -Her last MRI of the abdomen without contrast on 10/04/2018 showed she had cirrhosis  and portal venous hypertension, without suspicious liver lesion.  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 -Elevated bilirubin at 2.3 this is secondary to cirrhosis.  5.  B12 deficiency: - Patient takes B12 injections monthly. - She will continue on her monthly injections and we will recheck her levels in 3 months.      Orders placed this encounter:  Orders Placed This Encounter  Procedures  . CBC with Differential  . Comprehensive metabolic panel  . Iron and TIBC  . Ferritin  . Vitamin B12  . [place] [phone]

## 2019-07-03 NOTE — Assessment & Plan Note (Signed)
1.  Normocytic anemia: -This is from a combination of CKD, chronic blood loss, and iron deficiency. -Last Feraheme was on 12/24/2017 and 01/03/2018. -Hemoglobin is stable at 10.9, iron 86, TIBC 268, saturation percent 32, ferritin 222.  Patient denies any fatigue.  We will hold off on any parenteral iron at this time and continue to monitor her hemoglobin. -Return to clinic in 3 months.  2.  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 - Platelet count is more or less stable and stays around 50.   3.  Elevated creatinine: - This is stable at this time we will continue to monitor.  She does follow with nephrology.  4.  Cirrhosis: - She follows up with the liver transplant program at [facility]. -Her last MRI of the abdomen without contrast on 10/04/2018 showed she had cirrhosis and portal venous hypertension, without suspicious liver lesion.  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 -Elevated bilirubin at 2.3 this is secondary to cirrhosis.  5.  B12 deficiency: - Patient takes B12 injections monthly. - She will continue on her monthly injections and we will recheck her levels in 3 months.

## 2019-07-27 ENCOUNTER — Other Ambulatory Visit (HOSPITAL_COMMUNITY): Payer: Self-pay | Admitting: Gastroenterology

## 2019-07-27 ENCOUNTER — Encounter: Payer: Medicare Other | Attending: Internal Medicine | Admitting: Nutrition

## 2019-07-27 ENCOUNTER — Encounter: Payer: Self-pay | Admitting: Nutrition

## 2019-07-27 ENCOUNTER — Other Ambulatory Visit: Payer: Self-pay | Admitting: Gastroenterology

## 2019-07-27 ENCOUNTER — Other Ambulatory Visit: Payer: Self-pay

## 2019-07-27 VITALS — Ht 61.0 in | Wt 193.0 lb

## 2019-07-27 DIAGNOSIS — N184 Chronic kidney disease, stage 4 (severe): Secondary | ICD-10-CM | POA: Diagnosis present

## 2019-07-27 DIAGNOSIS — E119 Type 2 diabetes mellitus without complications: Secondary | ICD-10-CM | POA: Diagnosis not present

## 2019-07-27 DIAGNOSIS — K7581 Nonalcoholic steatohepatitis (NASH): Secondary | ICD-10-CM | POA: Insufficient documentation

## 2019-07-27 DIAGNOSIS — E66813 Obesity, class 3: Secondary | ICD-10-CM

## 2019-07-27 DIAGNOSIS — R772 Abnormality of alphafetoprotein: Secondary | ICD-10-CM

## 2019-07-27 NOTE — Progress Notes (Signed)
.   Medical Nutrition Therapy:  Appt start time: 1500 end time:  1884  Assessment:  Primary concerns today: Diabetes and CKD and Fatty Liver.. Lives with her husband. 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 Making great progress. Desires to lose weight and improve her DM and comorbidities.Alisha [name] 26 units and  Victoza and Glipizide. A1C 6.8% Down from 8.7%. She is sticking to her diet well. Feels better. Wants to lose more weight and frustrated with a little weight gain. FBS in 78-80's  In am and evenings are in the upper 180's.  She notes she is sticking to her meal plan and not skipping meals or snacking.  Weight is probably up from better blood sugar control.  She will increase physical actvity and that should help with weight loss. Vitals with BMI 07/27/2019 07/03/2019  Height 5' 1"    Weight 193 lbs 189 lbs 3 oz  BMI 16.60   Systolic  630  Diastolic  60  Pulse  72   .  Fatty  LIver. Suppose to be on a list for liver transplant but has to get A1C down.  CMP Latest Ref Rng & Units 06/26/2019 03/30/2019 12/28/2018  Glucose 70 - 99 mg/dL 232(H) 175(H) 289(H)  BUN 8 - 23 mg/dL 26(H) 16 16  Creatinine 0.44 - 1.00 mg/dL 1.92(H) 1.65(H) 1.79(H)  Sodium 135 - 145 mmol/L 140 140 138  Potassium 3.5 - 5.1 mmol/L 5.1 4.3 4.6  Chloride 98 - 111 mmol/L 110 109 109  CO2 22 - 32 mmol/L 21(L) 23 23  Calcium 8.9 - 10.3 mg/dL 9.1 9.2 9.1  Total Protein 6.5 - 8.1 g/dL 6.7 6.8 7.0  Total Bilirubin 0.3 - 1.2 mg/dL 2.3(H) 3.5(H) 2.9(H)  Alkaline Phos 38 - 126 U/L 73 77 84  AST 15 - 41 U/L 27 36 33  ALT 0 - 44 U/L 22 23 29     Preferred Learning Style:     No preference indicated   Learning Readiness:   Ready  Change in progress   MEDICATIONS:   DIETARY INTAKE:  24-hr recall:  Eating 3 better balanced meals as instructed with My Plate  Usual physical activity: walks some  Estimated energy needs: 1200  calories 135 g carbohydrates 90 g protein 50 g fat  Progress Towards Goal(s):   In progress.   Nutritional Diagnosis:  NB-1.1 Food and nutrition-related knowledge deficit As related to Diabetes and CKD and Obesity.  As evidenced by A1C 8.6%.    Intervention: Nutrition and Diabetes education provided on My Plate, CHO counting, meal planning, portion sizes, timing of meals, avoiding snacks between meals unless having a low blood sugar, target ranges for A1C and blood sugars, signs/symptoms and treatment of hyper/hypoglycemia, monitoring blood sugars, taking medications as prescribed, benefits of exercising 30 minutes per day and prevention of complications of DM.  Goals  Increase water to 4-5 bottes of water per day Walk 3 times per week. Talk Dr. Woody Seller about reducing insulin  Lose 2-3 per month Get A1C 6.5%  Teaching Method Utilized:  Visual Auditory Hands on  Handouts given during visit include: Diabetes Plate Method, Diabetes Instructions, Meal Plans.   Barriers to learning/adherence to lifestyle change: none  Demonstrated degree of understanding via:  Teach Back   Monitoring/Evaluation:  Dietary intake, exercise, and body weight in 3 month(s)  Would recommend to reduce Tresiba 10% due to FBS less than 70-90's most of the time per patient.

## 2019-07-27 NOTE — Patient Instructions (Signed)
Goals  Increase water to 4-5 bottes of water per day Walk 3 times per week. Talk Dr. Woody Seller about reducing insulin  Lose 2-3 per month Get A1C 6.5%

## 2019-08-03 ENCOUNTER — Inpatient Hospital Stay (HOSPITAL_COMMUNITY): Payer: Medicare Other | Attending: Oncology

## 2019-08-03 ENCOUNTER — Ambulatory Visit: Payer: Medicare Other | Admitting: Nutrition

## 2019-08-03 ENCOUNTER — Other Ambulatory Visit: Payer: Self-pay

## 2019-08-03 VITALS — BP 138/54 | HR 77 | Temp 97.5°F | Resp 20

## 2019-08-03 DIAGNOSIS — E538 Deficiency of other specified B group vitamins: Secondary | ICD-10-CM | POA: Diagnosis not present

## 2019-08-03 MED ORDER — CYANOCOBALAMIN 1000 MCG/ML IJ SOLN
INTRAMUSCULAR | Status: AC
  Filled 2019-08-03: qty 1

## 2019-08-03 MED ORDER — CYANOCOBALAMIN 1000 MCG/ML IJ SOLN
1000.0000 ug | Freq: Once | INTRAMUSCULAR | Status: AC
  Administered 2019-08-03: 12:00:00 1000 ug via INTRAMUSCULAR

## 2019-08-03 NOTE — Progress Notes (Signed)
Alisha [name] presents today for injection per MD orders. B12  administered IM  in right deltoid. Administration without incident. Patient tolerated well.   No complaints at this time. Discharged from clinic ambulatory. F/U with [facility] as scheduled.

## 2019-08-03 NOTE — Patient Instructions (Signed)
[facility] Discharge Instructions for Patients Receiving Chemotherapy  Today you received the following chemotherapy agents   To help prevent nausea and vomiting after your treatment, we encourage you to take your nausea medication   If you develop nausea and vomiting that is not controlled by your nausea medication, call the clinic.   BELOW ARE SYMPTOMS THAT SHOULD BE REPORTED IMMEDIATELY:  *FEVER GREATER THAN 100.5 F  *CHILLS WITH OR WITHOUT FEVER  NAUSEA AND VOMITING THAT IS NOT CONTROLLED WITH YOUR NAUSEA MEDICATION  *UNUSUAL SHORTNESS OF BREATH  *UNUSUAL BRUISING OR BLEEDING  TENDERNESS IN MOUTH AND THROAT WITH OR WITHOUT PRESENCE OF ULCERS  *URINARY PROBLEMS  *BOWEL PROBLEMS  UNUSUAL RASH Items with * indicate a potential emergency and should be followed up as soon as possible.  Feel free to call the clinic should you have any questions or concerns. The clinic phone number is [phone].  Please show the CHEMO ALERT CARD at check-in to the Emergency Department and triage nurse.   

## 2019-08-04 ENCOUNTER — Other Ambulatory Visit (HOSPITAL_COMMUNITY): Payer: Self-pay | Admitting: Gastroenterology

## 2019-08-04 ENCOUNTER — Ambulatory Visit (HOSPITAL_COMMUNITY)
Admission: RE | Admit: 2019-08-04 | Discharge: 2019-08-04 | Disposition: A | Discharge status: Home / Self Care | Payer: Medicare Other | Source: Ambulatory Visit | Attending: Gastroenterology | Admitting: Gastroenterology

## 2019-08-04 DIAGNOSIS — R772 Abnormality of alphafetoprotein: Secondary | ICD-10-CM

## 2019-08-19 ENCOUNTER — Other Ambulatory Visit: Payer: Self-pay | Admitting: Gastroenterology

## 2019-08-25 ENCOUNTER — Other Ambulatory Visit (HOSPITAL_COMMUNITY)
Admission: RE | Admit: 2019-08-25 | Discharge: 2019-08-25 | Disposition: A | Discharge status: Home / Self Care | Payer: Medicare Other | Source: Ambulatory Visit | Attending: Nephrology | Admitting: Nephrology

## 2019-08-25 DIAGNOSIS — D500 Iron deficiency anemia secondary to blood loss (chronic): Secondary | ICD-10-CM | POA: Diagnosis present

## 2019-08-25 LAB — RENAL FUNCTION PANEL
Albumin: 3.6 g/dL (ref 3.5–5.0)
Anion gap: 7 (ref 5–15)
BUN: 21 mg/dL (ref 8–23)
CO2: 22 mmol/L (ref 22–32)
Calcium: 8.6 mg/dL — ABNORMAL LOW (ref 8.9–10.3)
Chloride: 111 mmol/L (ref 98–111)
Creatinine, Ser: 1.88 mg/dL — ABNORMAL HIGH (ref 0.44–1.00)
GFR calc Af Amer: 31 mL/min — ABNORMAL LOW (ref >60)
GFR calc non Af Amer: 27 mL/min — ABNORMAL LOW (ref >60)
Glucose, Bld: 237 mg/dL — ABNORMAL HIGH (ref 70–99)
Phosphorus: 3.5 mg/dL (ref 2.5–4.6)
Potassium: 4.7 mmol/L (ref 3.5–5.1)
Sodium: 140 mmol/L (ref 135–145)

## 2019-08-25 LAB — PROTEIN / CREATININE RATIO, URINE
Creatinine, Urine: 267.55 mg/dL
Protein Creatinine Ratio: 0.04 mg/mg{Cre} (ref 0.00–0.15)
Total Protein, Urine: 12 mg/dL

## 2019-08-25 LAB — CBC
HCT: 32.5 % — ABNORMAL LOW (ref 36.0–46.0)
Hemoglobin: 10.9 g/dL — ABNORMAL LOW (ref 12.0–15.0)
MCH: 30.8 pg (ref 26.0–34.0)
MCHC: 33.5 g/dL (ref 30.0–36.0)
MCV: 91.8 fL (ref 80.0–100.0)
Platelets: 45 10*3/uL — ABNORMAL LOW (ref 150–400)
RBC: 3.54 MIL/uL — ABNORMAL LOW (ref 3.87–5.11)
RDW: 14.2 % (ref 11.5–15.5)
WBC: 3.4 10*3/uL — ABNORMAL LOW (ref 4.0–10.5)
nRBC: 0 % (ref 0.0–0.2)

## 2019-08-25 LAB — IRON AND TIBC
Iron: 76 ug/dL (ref 28–170)
Saturation Ratios: 27 % (ref 10.4–31.8)
TIBC: 284 ug/dL (ref 250–450)
UIBC: 208 ug/dL

## 2019-08-25 LAB — FERRITIN: Ferritin: 125 ng/mL (ref 11–307)

## 2019-08-25 LAB — VITAMIN D 25 HYDROXY (VIT D DEFICIENCY, FRACTURES): Vit D, 25-Hydroxy: 31.16 ng/mL (ref 30–100)

## 2019-08-26 LAB — PTH, INTACT AND CALCIUM
Calcium, Total (PTH): 8.4 mg/dL — ABNORMAL LOW (ref 8.7–10.3)
PTH: 40 pg/mL (ref 15–65)

## 2019-09-01 ENCOUNTER — Other Ambulatory Visit: Payer: Self-pay

## 2019-09-01 ENCOUNTER — Inpatient Hospital Stay (HOSPITAL_COMMUNITY): Payer: Medicare Other | Attending: Oncology

## 2019-09-01 VITALS — BP 124/63 | HR 80 | Temp 97.1°F | Resp 18

## 2019-09-01 DIAGNOSIS — Z79899 Other long term (current) drug therapy: Secondary | ICD-10-CM | POA: Diagnosis not present

## 2019-09-01 DIAGNOSIS — E538 Deficiency of other specified B group vitamins: Secondary | ICD-10-CM | POA: Diagnosis not present

## 2019-09-01 MED ORDER — CYANOCOBALAMIN 1000 MCG/ML IJ SOLN
1000.0000 ug | Freq: Once | INTRAMUSCULAR | Status: AC
  Administered 2019-09-01: 11:00:00 1000 ug via INTRAMUSCULAR
  Filled 2019-09-01: qty 1

## 2019-09-01 NOTE — Progress Notes (Signed)
B12 injection given per orders. Patient tolerated it well without problems. Vitals stable and discharged home from clinic ambulatory. Follow up as scheduled.

## 2019-09-01 NOTE — Patient Instructions (Signed)
[facility]  Discharge Instructions:   _______________________________________________________________  Thank you for choosing [facility] at [facility] to provide your oncology and hematology care.  To afford each patient quality time with our providers, please arrive at least 15 minutes before your scheduled appointment.  You need to re-schedule your appointment if you arrive 10 or more minutes late.  We strive to give you quality time with our providers, and arriving late affects you and other patients whose appointments are after yours.  Also, if you no show three or more times for appointments you may be dismissed from the clinic.  Again, thank you for choosing  [facility]. Our hope is that these requests will allow you access to exceptional care and in a timely manner. _______________________________________________________________  If you have questions after your visit, please contact our office at [phone] between the hours of 8:30 a.m. and 5:00 p.m. Voicemails left after 4:30 p.m. will not be returned until the following business day. _______________________________________________________________  For prescription refill requests, have your pharmacy contact our office. _______________________________________________________________  Recommendations made by the consultant and any test results will be sent to your referring physician. _______________________________________________________________ 

## 2019-09-27 ENCOUNTER — Other Ambulatory Visit: Payer: Self-pay

## 2019-09-27 ENCOUNTER — Inpatient Hospital Stay (HOSPITAL_COMMUNITY): Payer: Medicare PPO | Attending: Hematology

## 2019-09-27 DIAGNOSIS — D696 Thrombocytopenia, unspecified: Secondary | ICD-10-CM | POA: Insufficient documentation

## 2019-09-27 DIAGNOSIS — I129 Hypertensive chronic kidney disease with stage 1 through stage 4 chronic kidney disease, or unspecified chronic kidney disease: Secondary | ICD-10-CM | POA: Insufficient documentation

## 2019-09-27 DIAGNOSIS — K766 Portal hypertension: Secondary | ICD-10-CM | POA: Diagnosis not present

## 2019-09-27 DIAGNOSIS — D509 Iron deficiency anemia, unspecified: Secondary | ICD-10-CM | POA: Diagnosis present

## 2019-09-27 DIAGNOSIS — N189 Chronic kidney disease, unspecified: Secondary | ICD-10-CM | POA: Insufficient documentation

## 2019-09-27 DIAGNOSIS — K7460 Unspecified cirrhosis of liver: Secondary | ICD-10-CM | POA: Diagnosis not present

## 2019-09-27 DIAGNOSIS — Z8719 Personal history of other diseases of the digestive system: Secondary | ICD-10-CM | POA: Insufficient documentation

## 2019-09-27 DIAGNOSIS — K219 Gastro-esophageal reflux disease without esophagitis: Secondary | ICD-10-CM | POA: Insufficient documentation

## 2019-09-27 DIAGNOSIS — E538 Deficiency of other specified B group vitamins: Secondary | ICD-10-CM | POA: Insufficient documentation

## 2019-09-27 DIAGNOSIS — Z79899 Other long term (current) drug therapy: Secondary | ICD-10-CM | POA: Diagnosis not present

## 2019-09-27 DIAGNOSIS — E1122 Type 2 diabetes mellitus with diabetic chronic kidney disease: Secondary | ICD-10-CM | POA: Insufficient documentation

## 2019-09-27 DIAGNOSIS — Z794 Long term (current) use of insulin: Secondary | ICD-10-CM | POA: Insufficient documentation

## 2019-09-27 DIAGNOSIS — D500 Iron deficiency anemia secondary to blood loss (chronic): Secondary | ICD-10-CM

## 2019-09-27 DIAGNOSIS — E785 Hyperlipidemia, unspecified: Secondary | ICD-10-CM | POA: Insufficient documentation

## 2019-09-27 LAB — IRON AND TIBC
Iron: 70 ug/dL (ref 28–170)
Saturation Ratios: 25 % (ref 10.4–31.8)
TIBC: 280 ug/dL (ref 250–450)
UIBC: 210 ug/dL

## 2019-09-27 LAB — CBC WITH DIFFERENTIAL/PLATELET
Abs Immature Granulocytes: 0.01 10*3/uL (ref 0.00–0.07)
Basophils Absolute: 0 10*3/uL (ref 0.0–0.1)
Basophils Relative: 0 %
Eosinophils Absolute: 0.1 10*3/uL (ref 0.0–0.5)
Eosinophils Relative: 3 %
HCT: 33.4 % — ABNORMAL LOW (ref 36.0–46.0)
Hemoglobin: 11.1 g/dL — ABNORMAL LOW (ref 12.0–15.0)
Immature Granulocytes: 0 %
Lymphocytes Relative: 28 %
Lymphs Abs: 0.9 10*3/uL (ref 0.7–4.0)
MCH: 30.1 pg (ref 26.0–34.0)
MCHC: 33.2 g/dL (ref 30.0–36.0)
MCV: 90.5 fL (ref 80.0–100.0)
Monocytes Absolute: 0.2 10*3/uL (ref 0.1–1.0)
Monocytes Relative: 7 %
Neutro Abs: 2 10*3/uL (ref 1.7–7.7)
Neutrophils Relative %: 62 %
Platelets: 45 10*3/uL — ABNORMAL LOW (ref 150–400)
RBC: 3.69 MIL/uL — ABNORMAL LOW (ref 3.87–5.11)
RDW: 13.8 % (ref 11.5–15.5)
WBC: 3.3 10*3/uL — ABNORMAL LOW (ref 4.0–10.5)
nRBC: 0 % (ref 0.0–0.2)

## 2019-09-27 LAB — COMPREHENSIVE METABOLIC PANEL
ALT: 15 U/L (ref 0–44)
AST: 22 U/L (ref 15–41)
Albumin: 3.6 g/dL (ref 3.5–5.0)
Alkaline Phosphatase: 79 U/L (ref 38–126)
Anion gap: 6 (ref 5–15)
BUN: 17 mg/dL (ref 8–23)
CO2: 23 mmol/L (ref 22–32)
Calcium: 8.8 mg/dL — ABNORMAL LOW (ref 8.9–10.3)
Chloride: 109 mmol/L (ref 98–111)
Creatinine, Ser: 1.54 mg/dL — ABNORMAL HIGH (ref 0.44–1.00)
GFR calc Af Amer: 39 mL/min — ABNORMAL LOW (ref >60)
GFR calc non Af Amer: 34 mL/min — ABNORMAL LOW (ref >60)
Glucose, Bld: 226 mg/dL — ABNORMAL HIGH (ref 70–99)
Potassium: 4.3 mmol/L (ref 3.5–5.1)
Sodium: 138 mmol/L (ref 135–145)
Total Bilirubin: 2.1 mg/dL — ABNORMAL HIGH (ref 0.3–1.2)
Total Protein: 6.6 g/dL (ref 6.5–8.1)

## 2019-09-27 LAB — VITAMIN B12: Vitamin B-12: 913 pg/mL (ref 180–914)

## 2019-09-27 LAB — FERRITIN: Ferritin: 108 ng/mL (ref 11–307)

## 2019-09-27 LAB — FOLATE: Folate: 14.8 ng/mL (ref >5.9)

## 2019-10-03 ENCOUNTER — Other Ambulatory Visit: Payer: Self-pay

## 2019-10-03 ENCOUNTER — Ambulatory Visit: Payer: Medicare Other | Admitting: Nurse Practitioner

## 2019-10-03 ENCOUNTER — Encounter: Payer: Self-pay | Admitting: Nurse Practitioner

## 2019-10-03 VITALS — BP 140/70 | HR 78 | Temp 96.9°F | Ht 61.0 in | Wt 191.8 lb

## 2019-10-03 DIAGNOSIS — K2270 Barrett's esophagus without dysplasia: Secondary | ICD-10-CM | POA: Diagnosis not present

## 2019-10-03 DIAGNOSIS — I8510 Secondary esophageal varices without bleeding: Secondary | ICD-10-CM

## 2019-10-03 DIAGNOSIS — K7460 Unspecified cirrhosis of liver: Secondary | ICD-10-CM

## 2019-10-03 NOTE — Assessment & Plan Note (Signed)
History of esophageal varices status post band ligation.  Most recent EGD in 2020 with scarring from previous banding, no new varices.  Is due for repeat EGD in December of this year and she is currently on recall for this.  We can talk about scheduling this at her next visit in 6 months.  Call for any bleeding and if we are not open proceed to the ER.

## 2019-10-03 NOTE — Assessment & Plan Note (Signed)
History of Barrett's esophagus.  Her last EGD was 02/24/2019.  She is on more frequent EGD screening due to esophageal varices history.  This will allow consistent surveillance of her Barrett's esophagus.  She is on PPI and recommend she continue this indefinitely.  Follow-up in 6 months.

## 2019-10-03 NOTE — Assessment & Plan Note (Signed)
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  She is currently seeing [facility] for intermittent visits.  Her most recent meld score was up to 18 likely aberrant inflation due to chronic kidney disease and elevated creatinine.  AFP was elevated at [facility] and the patient had an MRI locally which found no hepatic masses.  Her labs and imaging are currently up-to-date.  They are encouraging her to continue management of her diabetes with last hemoglobin A1c in the 6 range.  They are also encouraging physical activity.  I echoed the sentiments to her and encouraged her to make a plan for physical activity.  She is currently helping to clean out several houses which is getting her some good activity daily.  After this, when it warms up, she will start walking again.  I recommended 150 minutes a week.  She does admit some intermittent yellowing of her skin, although I have not seen an elevated bilirubin to account for this.  No other overt hepatic symptoms.  Recommend current medications, follow-up with [facility] based on their recommendations, follow-up with [name] in 6 months.

## 2019-10-03 NOTE — Progress Notes (Signed)
Referring Provider: Glenda Chroman, MD Primary Care Physician:  Glenda Chroman, MD Primary GI:  Dr. Gala Romney  Chief Complaint  Patient presents with   Cirrhosis    HPI:   Alisha [name] is a 69 y.o. female who presents for follow-up on cirrhosis and varices.  The patient last seen in our office 03/29/2019 for cirrhosis, thrombocytopenia, secondary esophageal varices without bleeding.  She is also seen by [facility] liver transplant center.  Likely Nash cirrhosis.  Colonoscopy up-to-date next due in 2023.  Liver transplant center initial evaluation with meld of 13 with controlled esophageal variceal bleeding with banding protocol.  Recommended not a candidate for transplant at that time but continue with regular follow-ups.  Thrombocytopenia with platelets in the 40s.  Previously noted MRI with subcentimeter category 3 segment of the liver lobe lesion decreased in size and potentially benign, stable pancreatic lesions without high risk features and recommended follow-up in 2 years.  She is on a recall for this.  Also with a history of chronic renal failure stage IV followed by nephrology likely long history of diabetes and hypertension.  Most recent follow-up with [facility] transplant prior to her last visit found meld score increased to 18 though primarily driven by creatinine with kidney disease and recommended EGD sooner than 1 year for grade 2 esophageal varices and history of variceal bleed based on AASLD recommendations  MRI 10/04/2018 with image degradation due to motion and lack of IV contrast but suspicious liver lesions previously noted is no longer identified.  Follow-up in 18 months.  EGD updated 02/24/2019 with several areas of scarring esophagus consistent with prior banding, no varices identified/varices remain eradicated at this time.  Noted Barrett's esophagus status post biopsy, portal hypertensive gastropathy.  Recommend continue current medications clinic Protonix and propranolol and repeat  EGD in 1-1/2 years for surveillance (December 2021).  Last visit with [facility] hepatology on 03/21/2019.  At this point her meld-NA score was 20.  She was overall deemed to be not a good surgical candidate due to poor control of diabetes and sedentary lifestyle.  This was discussed with her and she is going to work on this prior to her follow-up in 3 months.  Recommended updated U/S imaging (locally) time. Previous mildly elevated AFP with negative MRI for suspicious lesions.  At her last visit she noted she had an appointment with [facility] the week prior (see above paragraph).  Some increased fatigue but thinks it is due to age.  Otherwise feels well and denies any other overt hepatic or GI symptoms.  Recommend ultrasound as requested by [facility], monitor for any symptoms, repeat EGD in 2021, follow-up with [facility] based on their recommendations, follow-up locally in 6 months.  Right upper quadrant ultrasound completed 04/03/2019 found stable mild diffuse hepatic steatosis, stable cholelithiasis without evidence of cholecystitis.  She does see a Firefighter for diabetes, [name], obesity.  Most recent transplant office visit dated 07/25/2019.  Reviewed that hemoglobin A1c currently improved at 6.7 with diabetes managed by primary care and also seeing a dietitian.  Weight stable.  However, she does remain sedentary.  Her meld score is documented at 62.  Recommended increased physical activity for 150 minutes a week.  Her AFP was elevated at 60.3.  Recommended MRI with Feraheme to be completed locally with order faxed to [facility].  Recommended follow-up with transplant in 3 months.  Requested MRI due to elevated AFP was completed 08/04/2019 which found hepatic cirrhosis and findings of portal  venous hypertension with no evidence of hepatic neoplasm.  Stable tiny less than 1 cm cystic foci in the pancreatic head and recommended continued follow-up by MRI in 2 years (2022).  Labs and imaging up to date. EGD up to  date.  Today she states she's doing ok overall. Denies abdominal pain. Occasional/rare epigastric discomfort if she lays on her stomach. Denies N/V, hematochezia, melena, fever, chills, unintentional weight loss. She is seeing a dietician. She says she does physical activity "somewhat" including cleaning out a lot of houses (with recent deaths in the family). She plans to do walking when the weather improved. Denies darkened urine. Has had some yellowing of her skin intermittently. Denies generalized pruritis, tremors/shakes, acute episodic confusion. Denies URI or flu-like symptoms. Denies loss of sense of taste or smell. Denies chest pain, dyspnea, dizziness, lightheadedness, syncope, near syncope. Denies any other upper or lower GI symptoms.  Past Medical History:  Diagnosis Date   B12 deficiency 11/03/2016   B12 deficiency 11/03/2016   Barrett's esophagus    Cirrhosis of liver without mention of alcohol    hep B surface antigen and HCV ab negative.pt has not had hepatitis A and B vaccines.U/S on 07/04/13 shows cirrhosis.   Diverticula of colon    pancolonic   Esophageal reflux    Esophageal varices (HCC)    Hiatal hernia    Iron deficiency anemia, unspecified    Other and unspecified hyperlipidemia    Portal hypertensive gastropathy (HCC)    Tubular adenoma    Type II or unspecified type diabetes mellitus without mention of complication, not stated as uncontrolled    Unspecified essential hypertension    Unspecified hemorrhoids without mention of complication     Past Surgical History:  Procedure Laterality Date   ABDOMINAL HYSTERECTOMY     BIOPSY  04/30/2016   Procedure: BIOPSY;  Surgeon: Daneil Dolin, MD;  [facility];  Service: Endoscopy;;  esophagus   BIOPSY  02/24/2019   Procedure: BIOPSY;  Surgeon: Daneil Dolin, MD;  [facility];  Service: Endoscopy;;   CESAREAN SECTION     x 2   COLONOSCOPY  03/2006   left sided diverticula,  splenic flexure tublar adenoma   COLONOSCOPY  07/2002   villous tubular adenoma in rectum   COLONOSCOPY  09/23/09   external hemorrhoids/scattered pan colonic diverticula otherwise normal. cecal lipoma bx negative, normal TI. Next TCS 09/2014   COLONOSCOPY N/A 11/12/2014   Procedure: COLONOSCOPY;  Surgeon: Daneil Dolin, MD;  [facility];  Service: Endoscopy;  Laterality: N/A;  1215 - moved to 12:30 - Ginger to notify pt   COLONOSCOPY N/A 01/07/2017   Procedure: COLONOSCOPY;  Surgeon: Daneil Dolin, MD;  [facility];  Service: Endoscopy;  Laterality: N/A;  3:15PM   ESOPHAGEAL BANDING N/A 12/20/2016   Procedure: ESOPHAGEAL BANDING;  Surgeon: Danie Binder, MD;  [facility];  Service: Endoscopy;  Laterality: N/A;   ESOPHAGEAL BANDING N/A 03/18/2017   Procedure: ESOPHAGEAL BANDING;  Surgeon: Daneil Dolin, MD;  [facility];  Service: Endoscopy;  Laterality: N/A;   ESOPHAGEAL BANDING  12/17/2017   Procedure: ESOPHAGEAL BANDING;  Surgeon: Daneil Dolin, MD;  [facility];  Service: Endoscopy;;   ESOPHAGOGASTRODUODENOSCOPY  08/2006   barretts esophagus, no dyplasia   ESOPHAGOGASTRODUODENOSCOPY  03/2006   barretts esophagus,bx focal atypia c/w low grade dysplasia, SB bx negative for celiac   ESOPHAGOGASTRODUODENOSCOPY  09/23/09  3 columns of grade 2 esophageal varices/hiatal hernia/3-4 cm segment Barrett's esophagus without dysplasia. Next EGD 09/2012   ESOPHAGOGASTRODUODENOSCOPY N/A 11/11/2012   Dr. Gala Romney- Barrett;s esophagus, esophageal varices, portal gastopathy. hiatal hernia   ESOPHAGOGASTRODUODENOSCOPY N/A 04/30/2016   Procedure: ESOPHAGOGASTRODUODENOSCOPY (EGD);  Surgeon: Daneil Dolin, MD;  [facility];  Service: Endoscopy;  Laterality: N/A;  815   ESOPHAGOGASTRODUODENOSCOPY N/A 12/20/2016   Procedure: ESOPHAGOGASTRODUODENOSCOPY (EGD);  Surgeon: Danie Binder, MD;  [facility];  Service: Endoscopy;   Laterality: N/A;   ESOPHAGOGASTRODUODENOSCOPY N/A 01/07/2017   Procedure: ESOPHAGOGASTRODUODENOSCOPY (EGD);  Surgeon: Daneil Dolin, MD;  [facility];  Service: Endoscopy;  Laterality: N/A;   ESOPHAGOGASTRODUODENOSCOPY N/A 03/18/2017   Procedure: ESOPHAGOGASTRODUODENOSCOPY (EGD);  Surgeon: Daneil Dolin, MD;  [facility];  Service: Endoscopy;  Laterality: N/A;  1030    ESOPHAGOGASTRODUODENOSCOPY N/A 12/17/2017   Procedure: ESOPHAGOGASTRODUODENOSCOPY (EGD);  Surgeon: Daneil Dolin, MD;  [facility];  Service: Endoscopy;  Laterality: N/A;  10:30am   ESOPHAGOGASTRODUODENOSCOPY N/A 02/24/2019   Procedure: ESOPHAGOGASTRODUODENOSCOPY (EGD);  Surgeon: Daneil Dolin, MD;  [facility];  Service: Endoscopy;  Laterality: N/A;  7:30am   HEMORRHOID SURGERY  2008   small bowel capsule endoscopy  10/2009   normal    Current Outpatient Medications  Medication Sig Dispense Refill   ACCU-CHEK AVIVA PLUS test strip USE ONE STRIP TWICE DAILY     ACCU-CHEK SOFTCLIX LANCETS lancets USE TO CHECK BLOOD SUGAR TWICE DAILY.     Cholecalciferol (VITAMIN D) 2000 units tablet Take 2,000 Units by mouth daily.     cyanocobalamin (,VITAMIN B-12,) 1000 MCG/ML injection Inject 1,000 mcg into the muscle every 30 (thirty) days.      GLIPIZIDE XL 10 MG 24 hr tablet Take 10 mg by mouth daily.      lisinopril (PRINIVIL,ZESTRIL) 10 MG tablet Take 10 mg by mouth daily.      NOVOFINE 32G X 6 MM MISC USE ONE AS DIRECTED TWICE DAILY.     NOVOTWIST 32G X 5 MM MISC USE ONE AS DIRECTED TWICE DAILY.     pantoprazole (PROTONIX) 40 MG tablet Take 1 tablet (40 mg total) by mouth 2 (two) times daily before a meal. 60 tablet 1   propranolol (INDERAL) 10 MG tablet TAKE THREE (3) TABLETS BY MOUTH EVERY MORNING AND 3 TABLETS EVERY EVENING. 180 tablet 5   TRESIBA FLEXTOUCH 200 UNIT/ML SOPN Inject 44 Units into the skin at bedtime. Uses sliding scale once a day  12   ULTICARE PEN  NEEDLES 31G X 5 MM MISC USE ONE AS DIRECTED TWICE DAILY.     VICTOZA 18 MG/3ML SOLN Inject 1.8 mg into the skin daily.      No current facility-administered medications for this visit.    Allergies as of 10/03/2019 - Review Complete 10/03/2019  Allergen Reaction Noted   Codeine Nausea And Vomiting 04/19/2012    Family History  Problem Relation Age of Onset   Alzheimer's disease Mother    CAD Father    Diabetes Mellitus II Father    Alzheimer's disease Father    Diabetes Mellitus II Sister    Colon cancer Sister 30       small, surgical excision; chemo/rad not needed   Cancer - Other Brother    Diabetes Mellitus II Brother    Hypertension Brother    Liver disease Neg Hx     Social History   Socioeconomic History  Marital status: Married    Spouse name: Not on file   Number of children: Not on file   Years of education: Not on file   Highest education level: Not on file  Occupational History   Not on file  Tobacco Use   Smoking status: Never Smoker   Smokeless tobacco: Never Used  Substance and Sexual Activity   Alcohol use: No   Drug use: No   Sexual activity: Yes  Other Topics Concern   Not on file  Social History Narrative   Not on file   Social Determinants of Health   Financial Resource Strain:    Difficulty of Paying Living Expenses: Not on file  Food Insecurity:    Worried About [place] in the Last Year: Not on file   Ran Out of Food in the Last Year: Not on file  Transportation Needs:    Lack of Transportation (Medical): Not on file   Lack of Transportation (Non-Medical): Not on file  Physical Activity:    Days of Exercise per Week: Not on file   Minutes of Exercise per Session: Not on file  Stress:    Feeling of Stress : Not on file  Social Connections:    Frequency of Communication with Friends and Family: Not on file   Frequency of Social Gatherings with Friends and Family: Not on file   Attends  Religious Services: Not on file   Active Member of Clubs or Organizations: Not on file   Attends Archivist Meetings: Not on file   Marital Status: Not on file    Review of Systems: General: Negative for anorexia, weight loss, fever, chills, fatigue, weakness. ENT: Negative for hoarseness, difficulty swallowing. CV: Negative for chest pain, angina, palpitations, peripheral edema.  Respiratory: Negative for dyspnea at rest, cough, sputum, wheezing.  GI: See history of present illness. Derm: Negative for rash or itching.  Neuro: Negative for memory loss, confusion.  Endo: Negative for unusual weight change.  Heme: Negative for bruising or bleeding. Allergy: Negative for rash or hives.   Physical Exam: BP 140/70    Pulse 78    Temp (!) 96.9 F (36.1 C)    Ht 5' 1"  (1.549 m)    Wt 191 lb 12.8 oz (87 kg)    BMI 36.24 kg/m  General:   Alert and oriented. Pleasant and cooperative. Well-nourished and well-developed.  Eyes:  Without icterus, sclera clear and conjunctiva pink.  Ears:  Normal auditory acuity. Cardiovascular:  S1, S2 present without murmurs appreciated. Extremities without clubbing or edema. Respiratory:  Clear to auscultation bilaterally. No wheezes, rales, or rhonchi. No distress.  Gastrointestinal:  +BS, soft, non-tender and non-distended. No HSM noted. No guarding or rebound. No masses appreciated.  Rectal:  Deferred  Musculoskalatal:  Symmetrical without gross deformities. Neurologic:  Alert and oriented x4;  grossly normal neurologically. Psych:  Alert and cooperative. Normal mood and affect. Heme/Lymph/Immune: No excessive bruising noted.    10/03/2019 11:55 AM   Disclaimer: This note was dictated with voice recognition software. Similar sounding words can inadvertently be transcribed and may not be corrected upon review.

## 2019-10-03 NOTE — Patient Instructions (Signed)
Your health issues we discussed today were:   Cirrhosis with varices: 1. Continue your current medications 2. Continue to see [facility] transplant based on your recommendations 3. As we discussed, continue with your good diabetes control 4. Make an attempt to exercise 20 to 30 minutes a day either by cleaning out houses, walking, resistance training (weights), etc. 5. Call for any worsening or severe symptoms such as worsening or persistent yellowing of your eyes, real dark brown urine, sudden confusion, etc.  Barrett's esophagus: 1. You do have Barrett's esophagus which is abnormal cells in the lining of your esophagus 2. You will need to be on a PPI (such as Protonix which you currently take) for the rest of your life 3. Because of your EGDs that we do to check for varices, this will allow [name] to keep a good eye on everything  Overall I recommend:  1. Continue your other current medications 2. Return for follow-up in 6 months 3. Call us if you have any questions or concerns.   ---------------------------------------------------------------  COVID-19 Vaccine Information can be found at: [email or web address] For questions related to vaccine distribution or appointments, please email [email or web address] or call [phone].   ---------------------------------------------------------------  At [facility] we value your feedback. You may receive a survey about your visit today. Please share your experience as we strive to create trusting relationships with our patients to provide genuine, compassionate, quality care.  We appreciate your understanding and patience as we review any laboratory studies, imaging, and other diagnostic tests that are ordered as we care for you. Our office policy is 5 business days for review of these results, and any emergent or urgent results are addressed in a timely manner for your best  interest. If you do not hear from our office in 1 week, please contact us.   We also encourage the use of MyChart, which contains your medical information for your review as well. If you are not enrolled in this feature, an access code is on this after visit summary for your convenience. Thank you for allowing [name] to be involved in your care.  It was great to see you today!  I hope you have a Happy New Year!!

## 2019-10-04 ENCOUNTER — Inpatient Hospital Stay (HOSPITAL_COMMUNITY): Payer: Medicare PPO | Admitting: Hematology

## 2019-10-04 ENCOUNTER — Encounter (HOSPITAL_COMMUNITY): Payer: Self-pay | Admitting: Hematology

## 2019-10-04 ENCOUNTER — Inpatient Hospital Stay (HOSPITAL_COMMUNITY): Payer: Medicare PPO

## 2019-10-04 VITALS — BP 128/60 | HR 65 | Temp 97.7°F | Resp 20 | Wt 193.1 lb

## 2019-10-04 DIAGNOSIS — E538 Deficiency of other specified B group vitamins: Secondary | ICD-10-CM

## 2019-10-04 DIAGNOSIS — D500 Iron deficiency anemia secondary to blood loss (chronic): Secondary | ICD-10-CM

## 2019-10-04 DIAGNOSIS — D509 Iron deficiency anemia, unspecified: Secondary | ICD-10-CM | POA: Diagnosis not present

## 2019-10-04 MED ORDER — CYANOCOBALAMIN 1000 MCG/ML IJ SOLN
1000.0000 ug | Freq: Once | INTRAMUSCULAR | Status: AC
  Administered 2019-10-04: 12:00:00 1000 ug via INTRAMUSCULAR

## 2019-10-04 MED ORDER — CYANOCOBALAMIN 1000 MCG/ML IJ SOLN
INTRAMUSCULAR | Status: AC
  Filled 2019-10-04: qty 1

## 2019-10-04 NOTE — Progress Notes (Signed)
1210 Labs reviewed with and pt seen by Dr. Delton Coombes and pt approved for Vit B12 injection today per MD                                    Alisha [name] tolerated Vit B12 injection well without complaints or incident. Pt discharged self ambulatory in satisfactory condition

## 2019-10-04 NOTE — Patient Instructions (Addendum)
[place] at [facility] Discharge Instructions  You were seen today by Dr. Delton Coombes. He went over your recent lab results. He would like you to take this B12 injection today and then start taking OTC B12 1m daily. He will see you back in 4 months for labs and follow up.   [facility] and [facility].  To afford each patient quality time with our provider, please arrive at least 15 minutes before your scheduled appointment time.   If you have a lab appointment with the [place] [place] [place] in thru the  Main Entrance and check in at the main information desk  You need to re-schedule your appointment should you arrive 10 or more minutes late.  We strive to give you quality time with our providers, and arriving late affects you and other patients whose appointments are after yours.  Also, if you no show three or more times for appointments you may be dismissed from the clinic at the providers discretion.     [facility], [facility]  Our hope is that these requests will decrease the amount of time that you wait before being seen by our physicians.       _____________________________________________________________  Should you have questions [facility] please contact our office at [phone] between the hours of 8:00 a.m. and 4:30 p.m.  Voicemails left after 4:00 p.m. will not be returned until the following business day.  For prescription refill requests, have your pharmacy contact our office and allow 72 hours.    Cancer Center Support Programs:   > Cancer Support Group  2nd Tuesday of the month 1pm-2pm, Journey Room

## 2019-10-04 NOTE — Patient Instructions (Signed)
[facility] at [facility] Discharge Instructions  Received Vit B12 injection today. Follow-up as scheduled. Call clinic for any questions or concerns   Thank you for choosing [facility] at [facility] to provide your oncology and hematology care.  To afford each patient quality time with our provider, please arrive at least 15 minutes before your scheduled appointment time.   If you have a lab appointment with the Cancer Center please come in thru the Main Entrance and check in at the main information desk.  You need to re-schedule your appointment should you arrive 10 or more minutes late.  We strive to give you quality time with our providers, and arriving late affects you and other patients whose appointments are after yours.  Also, if you no show three or more times for appointments you may be dismissed from the clinic at the providers discretion.     Again, thank you for choosing [facility].  Our hope is that these requests will decrease the amount of time that you wait before being seen by our physicians.       _____________________________________________________________  Should you have questions after your visit to  Cancer Center, please contact our office at [phone] between the hours of 8:00 a.m. and 4:30 p.m.  Voicemails left after 4:00 p.m. will not be returned until the following business day.  For prescription refill requests, have your pharmacy contact our office and allow 72 hours.    Due to Covid, you will need to wear a mask upon entering the hospital. If you do not have a mask, a mask will be given to you at the Main Entrance upon arrival. For doctor visits, patients may have 1 support person with them. For treatment visits, patients can not have anyone with them due to social distancing guidelines and our immunocompromised population.     

## 2019-10-04 NOTE — Assessment & Plan Note (Addendum)
1. Normocytic anemia: -Combination of CKD, blood loss, iron deficiency -Feraheme on 10/05/2018. -Labs from 09/27/2019 reviewed by me shows hemoglobin 11.1. Ferritin is 108. Percent saturation is 25. -We will see her back in 4 months with repeat labs.  2. B12 deficiency: -She has been receiving monthly injections. Her B12 level is 913. -She would like to discontinue B12 injections. I have told her to start taking B12 1 mg daily. We will check her B12 in 4 months.  3. 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 Platelet count is between 40 and 50 and stable. -She follows up at [facility] liver transplant clinic.

## 2019-10-04 NOTE — Progress Notes (Signed)
[facility] [address], [place] [address]   CLINIC:  Medical Oncology/Hematology  PCP:  Glenda Chroman, MD [place] [id number] [phone]   REASON FOR VISIT: Follow-up for iron deficiency anemia  CURRENT THERAPY: intermittent feraheme infusions.   INTERVAL HISTORY:  Ms. Ellerman 69 y.o. female seen for follow-up of iron deficiency anemia and B12 deficiency state. She is receiving B12 injections monthly. Last Shirlean Kelly was a year ago. Denies any bleeding per rectum or melena. Reports energy levels have been good at 75% although appetite is low at 25%. Chronic cough is stable.   REVIEW OF SYSTEMS:  Review of Systems  Respiratory: Positive for cough.   All other systems reviewed and are negative.    PAST MEDICAL/SURGICAL HISTORY:  Past Medical History:  Diagnosis Date  . B12 deficiency 11/03/2016  . B12 deficiency 11/03/2016  . Barrett's esophagus   . Cirrhosis of liver without mention of alcohol    hep B surface antigen and HCV ab negative.pt has not had hepatitis A and B vaccines.U/S on 07/04/13 shows cirrhosis.  . Diverticula of colon    pancolonic  . Esophageal reflux   . Esophageal varices ([place])   . Hiatal hernia   . Iron deficiency anemia, unspecified   . Other and unspecified hyperlipidemia   . Portal hypertensive gastropathy ([place])   . Tubular adenoma   . Type II or unspecified type diabetes mellitus without mention of complication, not stated as uncontrolled   . Unspecified essential hypertension   . Unspecified hemorrhoids without mention of complication    Past Surgical History:  Procedure Laterality Date  . ABDOMINAL HYSTERECTOMY    . BIOPSY  04/30/2016   Procedure: BIOPSY;  Surgeon: Daneil Dolin, MD;  [facility];  Service: Endoscopy;;  esophagus  . BIOPSY  02/24/2019   Procedure: BIOPSY;  Surgeon: Daneil Dolin, MD;  [facility];  Service: Endoscopy;;  . CESAREAN SECTION     x 2  .  COLONOSCOPY  03/2006   left sided diverticula, splenic flexure tublar adenoma  . COLONOSCOPY  07/2002   villous tubular adenoma in rectum  . COLONOSCOPY  09/23/09   external hemorrhoids/scattered pan colonic diverticula otherwise normal. cecal lipoma bx negative, normal TI. Next TCS 09/2014  . COLONOSCOPY N/A 11/12/2014   Procedure: COLONOSCOPY;  Surgeon: Daneil Dolin, MD;  [facility];  Service: Endoscopy;  Laterality: N/A;  1215 - moved to 12:30 - Ginger to notify pt  . COLONOSCOPY N/A 01/07/2017   Procedure: COLONOSCOPY;  Surgeon: Daneil Dolin, MD;  [facility];  Service: Endoscopy;  Laterality: N/A;  3:15PM  . ESOPHAGEAL BANDING N/A 12/20/2016   Procedure: ESOPHAGEAL BANDING;  Surgeon: Danie Binder, MD;  [facility];  Service: Endoscopy;  Laterality: N/A;  . ESOPHAGEAL BANDING N/A 03/18/2017   Procedure: ESOPHAGEAL BANDING;  Surgeon: Daneil Dolin, MD;  [facility];  Service: Endoscopy;  Laterality: N/A;  . ESOPHAGEAL BANDING  12/17/2017   Procedure: ESOPHAGEAL BANDING;  Surgeon: Daneil Dolin, MD;  [facility];  Service: Endoscopy;;  . ESOPHAGOGASTRODUODENOSCOPY  08/2006   barretts esophagus, no dyplasia  . ESOPHAGOGASTRODUODENOSCOPY  03/2006   barretts esophagus,bx focal atypia c/w low grade dysplasia, SB bx negative for celiac  . ESOPHAGOGASTRODUODENOSCOPY  09/23/09   3 columns of grade 2 esophageal varices/hiatal hernia/3-4 cm segment Barrett's esophagus without dysplasia. Next EGD 09/2012  .  ESOPHAGOGASTRODUODENOSCOPY N/A 11/11/2012   Dr. Gala Romney- Barrett;s esophagus, esophageal varices, portal gastopathy. hiatal hernia  . ESOPHAGOGASTRODUODENOSCOPY N/A 04/30/2016   Procedure: ESOPHAGOGASTRODUODENOSCOPY (EGD);  Surgeon: Daneil Dolin, MD;  [facility];  Service: Endoscopy;  Laterality: N/A;  815  . ESOPHAGOGASTRODUODENOSCOPY N/A 12/20/2016   Procedure: ESOPHAGOGASTRODUODENOSCOPY (EGD);  Surgeon: Danie Binder, MD;   [facility];  Service: Endoscopy;  Laterality: N/A;  . ESOPHAGOGASTRODUODENOSCOPY N/A 01/07/2017   Procedure: ESOPHAGOGASTRODUODENOSCOPY (EGD);  Surgeon: Daneil Dolin, MD;  [facility];  Service: Endoscopy;  Laterality: N/A;  . ESOPHAGOGASTRODUODENOSCOPY N/A 03/18/2017   Procedure: ESOPHAGOGASTRODUODENOSCOPY (EGD);  Surgeon: Daneil Dolin, MD;  [facility];  Service: Endoscopy;  Laterality: N/A;  1030   . ESOPHAGOGASTRODUODENOSCOPY N/A 12/17/2017   Procedure: ESOPHAGOGASTRODUODENOSCOPY (EGD);  Surgeon: Daneil Dolin, MD;  [facility];  Service: Endoscopy;  Laterality: N/A;  10:30am  . ESOPHAGOGASTRODUODENOSCOPY N/A 02/24/2019   Procedure: ESOPHAGOGASTRODUODENOSCOPY (EGD);  Surgeon: Daneil Dolin, MD;  [facility];  Service: Endoscopy;  Laterality: N/A;  7:30am  . HEMORRHOID SURGERY  2008  . small bowel capsule endoscopy  10/2009   normal     SOCIAL HISTORY:  Social History   Socioeconomic History  . Marital status: Married    Spouse name: Not on file  . Number of children: Not on file  . Years of education: Not on file  . Highest education level: Not on file  Occupational History  . Not on file  Tobacco Use  . Smoking status: Never Smoker  . Smokeless tobacco: Never Used  Substance and Sexual Activity  . Alcohol use: No  . Drug use: No  . Sexual activity: Yes  Other Topics Concern  . Not on file  Social History Narrative  . Not on file   Social Determinants of Health   Financial Resource Strain:   . Difficulty of Paying Living Expenses: Not on file  Food Insecurity:   . Worried About Charity fundraiser in the Last Year: Not on file  . Ran Out of Food in the Last Year: Not on file  Transportation Needs:   . Lack of Transportation (Medical): Not on file  . Lack of Transportation (Non-Medical): Not on file  Physical Activity:   . Days of Exercise per Week: Not on file  . Minutes of Exercise per Session: Not  on file  Stress:   . Feeling of Stress : Not on file  Social Connections:   . Frequency of Communication with Friends and Family: Not on file  . Frequency of Social Gatherings with Friends and Family: Not on file  . Attends Religious Services: Not on file  . Active Member of Clubs or Organizations: Not on file  . Attends Archivist Meetings: Not on file  . Marital Status: Not on file  Intimate Partner Violence:   . Fear of Current or Ex-Partner: Not on file  . Emotionally Abused: Not on file  . Physically Abused: Not on file  . Sexually Abused: Not on file    FAMILY HISTORY:  Family History  Problem Relation Age of Onset  . Alzheimer's disease Mother   . CAD Father   . Diabetes Mellitus II Father   . Alzheimer's disease Father   . Diabetes Mellitus II Sister   . Colon cancer Sister 20       small, surgical excision; chemo/rad not needed  . Cancer - Other Brother   .  Diabetes Mellitus II Brother   . Hypertension Brother   . Liver disease Neg Hx     CURRENT MEDICATIONS:  Outpatient Encounter Medications as of 10/04/2019  Medication Sig Note  . glucose blood test strip 1 each by Other route 2 (two) times daily. Use as instructed   . ACCU-CHEK SOFTCLIX LANCETS lancets USE TO CHECK BLOOD SUGAR TWICE DAILY.   Marland Kitchen Cholecalciferol (VITAMIN D) 2000 units tablet Take 2,000 Units by mouth daily.   . cyanocobalamin (,VITAMIN B-12,) 1000 MCG/ML injection Inject 1,000 mcg into the muscle every 30 (thirty) days.  12/13/2018: LAST DOSE:12/07/2018  . GLIPIZIDE XL 10 MG 24 hr tablet Take 10 mg by mouth daily.    Marland Kitchen lisinopril (PRINIVIL,ZESTRIL) 10 MG tablet Take 10 mg by mouth daily.    Marland Kitchen NOVOFINE 32G X 6 MM MISC USE ONE AS DIRECTED TWICE DAILY.   Marland Kitchen NOVOTWIST 32G X 5 MM MISC USE ONE AS DIRECTED TWICE DAILY.   . pantoprazole (PROTONIX) 40 MG tablet Take 1 tablet (40 mg total) by mouth 2 (two) times daily before a meal.   . propranolol (INDERAL) 10 MG tablet TAKE THREE (3) TABLETS BY  MOUTH EVERY MORNING AND 3 TABLETS EVERY EVENING.   . TRESIBA FLEXTOUCH 200 UNIT/ML SOPN Inject 44 Units into the skin at bedtime. Uses sliding scale once a day   . ULTICARE PEN NEEDLES 31G X 5 MM MISC USE ONE AS DIRECTED TWICE DAILY.   Marland Kitchen VICTOZA 18 MG/3ML SOLN Inject 1.8 mg into the skin daily.    . [DISCONTINUED] ACCU-CHEK AVIVA PLUS test strip USE ONE STRIP TWICE DAILY    No facility-administered encounter medications on file as of 10/04/2019.    ALLERGIES:  Allergies  Allergen Reactions  . Codeine Nausea And Vomiting     PHYSICAL EXAM:  ECOG Performance status: 1  Vitals:   10/04/19 1131  BP: 128/60  Pulse: 65  Resp: 20  Temp: 97.7 F (36.5 C)  SpO2: 100%   Filed Weights   10/04/19 1131  Weight: 193 lb 1.6 oz (87.6 kg)    Physical Exam Constitutional:      Appearance: Normal appearance. She is normal weight.  Cardiovascular:     Rate and Rhythm: Normal rate and regular rhythm.     Heart sounds: Normal heart sounds.  Pulmonary:     Effort: Pulmonary effort is normal.     Breath sounds: Normal breath sounds.  Abdominal:     General: Abdomen is flat.     Palpations: Abdomen is soft.  Musculoskeletal:        General: Normal range of motion.  Skin:    General: Skin is warm and dry.  Neurological:     Mental Status: She is alert and oriented to person, place, and time. Mental status is at baseline.  Psychiatric:        Mood and Affect: Mood normal.        Behavior: Behavior normal.        Thought Content: Thought content normal.        Judgment: Judgment normal.   Abdomen: No palpable masses. Lymphadenopathy: No palpable lymphadenopathy.   LABORATORY DATA:  I have reviewed the labs as listed.  CBC    Component Value Date/Time   WBC 3.3 (L) 09/27/2019 1208   RBC 3.69 (L) 09/27/2019 1208   HGB 11.1 (L) 09/27/2019 1208   HCT 33.4 (L) 09/27/2019 1208   PLT 45 (L) 09/27/2019 1208   MCV 90.5 09/27/2019 1208  MCH 30.1 09/27/2019 1208   MCHC 33.2  09/27/2019 1208   RDW 13.8 09/27/2019 1208   LYMPHSABS 0.9 09/27/2019 1208   MONOABS 0.2 09/27/2019 1208   EOSABS 0.1 09/27/2019 1208   BASOSABS 0.0 09/27/2019 1208   CMP Latest Ref Rng & Units 09/27/2019 08/25/2019 08/25/2019  Glucose 70 - 99 mg/dL 226(H) 237(H) -  BUN 8 - 23 mg/dL 17 21 -  Creatinine 0.44 - 1.00 mg/dL 1.54(H) 1.88(H) -  Sodium 135 - 145 mmol/L 138 140 -  Potassium 3.5 - 5.1 mmol/L 4.3 4.7 -  Chloride 98 - 111 mmol/L 109 111 -  CO2 22 - 32 mmol/L 23 22 -  Calcium 8.9 - 10.3 mg/dL 8.8(L) 8.6(L) 8.4(L)  Total Protein 6.5 - 8.1 g/dL 6.6 - -  Total Bilirubin 0.3 - 1.2 mg/dL 2.1(H) - -  Alkaline Phos 38 - 126 U/L 79 - -  AST 15 - 41 U/L 22 - -  ALT 0 - 44 U/L 15 - -       DIAGNOSTIC IMAGING:  I have independently reviewed the scans and discussed with the patient.    ASSESSMENT & PLAN:   Iron deficiency anemia 1. Normocytic anemia: -Combination of CKD, blood loss, iron deficiency -Feraheme on 10/05/2018. -Labs from 09/27/2019 reviewed by me shows hemoglobin 11.1. Ferritin is 108. Percent saturation is 25. -We will see her back in 4 months with repeat labs.  2. B12 deficiency: -She has been receiving monthly injections. Her B12 level is 913. -She would like to discontinue B12 injections. I have told her to start taking B12 1 mg daily. We will check her B12 in 4 months.  3. 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 Platelet count is between 40 and 50 and stable. -She follows up at [facility] liver transplant clinic.      Orders placed this encounter:  Orders Placed This Encounter  Procedures  . CBC with Differential/Platelet  . Iron and TIBC  . Ferritin  . Vitamin B12  . Folate      Derek Jack, MD [place] [phone]

## 2019-10-21 IMAGING — DX DG ABDOMEN 1V
2 series · 2 of 2 positions shown · non-contrast
Comparison: 05/10/2017

CLINICAL DATA: Check for previously placed endoscopy clip

EXAM:
ABDOMEN - 1 VIEW

[abdomen kub (1 of 2)]
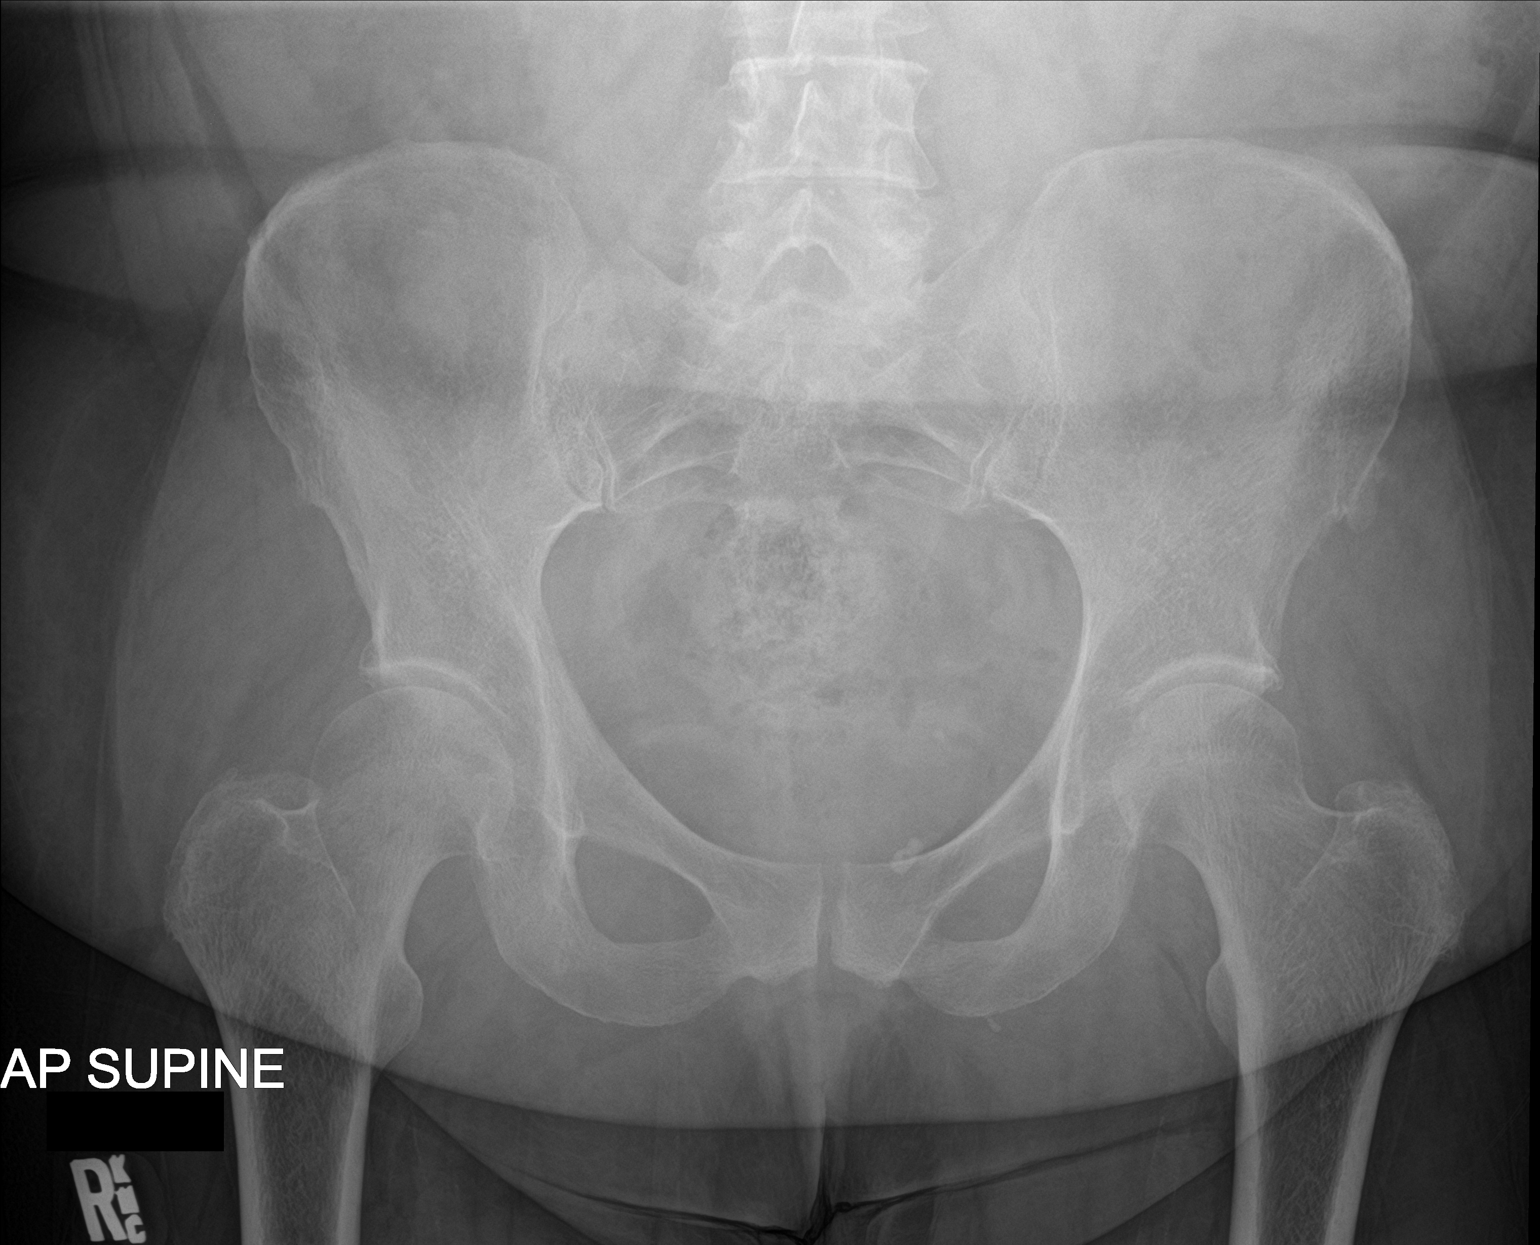

[abdomen kub (2 of 2)]
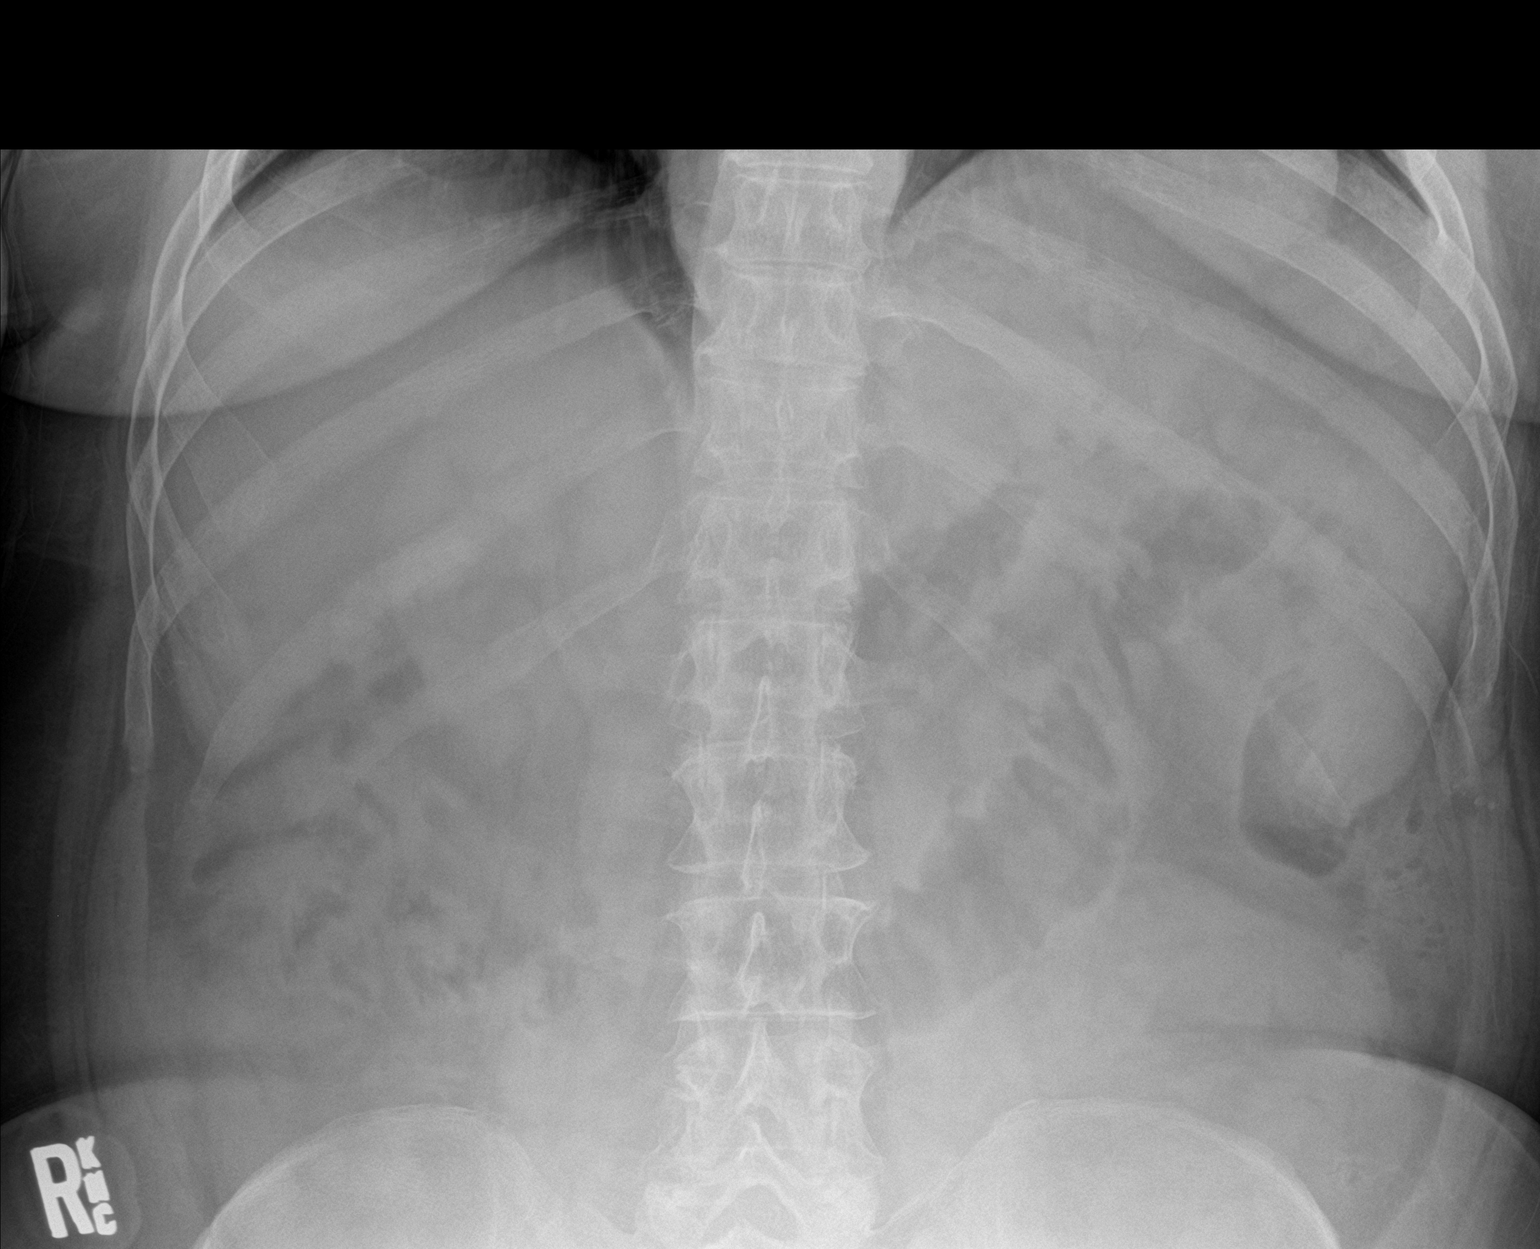

[2 of 2 positions shown; findings below may reference images not displayed]

FINDINGS: Scattered large and small bowel gas is noted. Mild osteophytic
changes of the lumbar spine are seen. Previously placed endoscopy
clip in the right colon has passed in the interval from the prior
exam. No new focal abnormality is noted.
IMPRESSION: Previously placed clip has passed in the interval. No acute
abnormality noted.

## 2019-11-14 ENCOUNTER — Other Ambulatory Visit (HOSPITAL_COMMUNITY)
Admission: RE | Admit: 2019-11-14 | Discharge: 2019-11-14 | Disposition: A | Discharge status: Home / Self Care | Payer: Medicare PPO | Source: Ambulatory Visit | Attending: Nephrology | Admitting: Nephrology

## 2019-11-14 DIAGNOSIS — R809 Proteinuria, unspecified: Secondary | ICD-10-CM | POA: Insufficient documentation

## 2019-11-14 DIAGNOSIS — E559 Vitamin D deficiency, unspecified: Secondary | ICD-10-CM | POA: Insufficient documentation

## 2019-11-14 DIAGNOSIS — E1122 Type 2 diabetes mellitus with diabetic chronic kidney disease: Secondary | ICD-10-CM | POA: Insufficient documentation

## 2019-11-14 DIAGNOSIS — N189 Chronic kidney disease, unspecified: Secondary | ICD-10-CM | POA: Insufficient documentation

## 2019-11-14 DIAGNOSIS — E1129 Type 2 diabetes mellitus with other diabetic kidney complication: Secondary | ICD-10-CM | POA: Diagnosis present

## 2019-11-14 DIAGNOSIS — D631 Anemia in chronic kidney disease: Secondary | ICD-10-CM | POA: Diagnosis present

## 2019-11-14 DIAGNOSIS — E211 Secondary hyperparathyroidism, not elsewhere classified: Secondary | ICD-10-CM | POA: Insufficient documentation

## 2019-11-14 LAB — RENAL FUNCTION PANEL
Albumin: 3.8 g/dL (ref 3.5–5.0)
Anion gap: 5 (ref 5–15)
BUN: 18 mg/dL (ref 8–23)
CO2: 25 mmol/L (ref 22–32)
Calcium: 8.8 mg/dL — ABNORMAL LOW (ref 8.9–10.3)
Chloride: 107 mmol/L (ref 98–111)
Creatinine, Ser: 1.75 mg/dL — ABNORMAL HIGH (ref 0.44–1.00)
GFR calc Af Amer: 34 mL/min — ABNORMAL LOW (ref >60)
GFR calc non Af Amer: 29 mL/min — ABNORMAL LOW (ref >60)
Glucose, Bld: 201 mg/dL — ABNORMAL HIGH (ref 70–99)
Phosphorus: 3.6 mg/dL (ref 2.5–4.6)
Potassium: 4.3 mmol/L (ref 3.5–5.1)
Sodium: 137 mmol/L (ref 135–145)

## 2019-11-14 LAB — IRON AND TIBC
Iron: 89 ug/dL (ref 28–170)
Saturation Ratios: 30 % (ref 10.4–31.8)
TIBC: 301 ug/dL (ref 250–450)
UIBC: 212 ug/dL

## 2019-11-14 LAB — CBC
HCT: 35 % — ABNORMAL LOW (ref 36.0–46.0)
Hemoglobin: 11.5 g/dL — ABNORMAL LOW (ref 12.0–15.0)
MCH: 29.4 pg (ref 26.0–34.0)
MCHC: 32.9 g/dL (ref 30.0–36.0)
MCV: 89.5 fL (ref 80.0–100.0)
Platelets: 48 10*3/uL — ABNORMAL LOW (ref 150–400)
RBC: 3.91 MIL/uL (ref 3.87–5.11)
RDW: 14.4 % (ref 11.5–15.5)
WBC: 3.5 10*3/uL — ABNORMAL LOW (ref 4.0–10.5)
nRBC: 0 % (ref 0.0–0.2)

## 2019-11-14 LAB — PROTEIN / CREATININE RATIO, URINE
Creatinine, Urine: 206.95 mg/dL
Protein Creatinine Ratio: 0.04 mg/mg{Cre} (ref 0.00–0.15)
Total Protein, Urine: 8 mg/dL

## 2019-11-14 LAB — VITAMIN D 25 HYDROXY (VIT D DEFICIENCY, FRACTURES): Vit D, 25-Hydroxy: 43.38 ng/mL (ref 30–100)

## 2019-11-14 LAB — VITAMIN B12: Vitamin B-12: 1364 pg/mL — ABNORMAL HIGH (ref 180–914)

## 2019-11-14 LAB — FOLATE: Folate: 15.8 ng/mL (ref >5.9)

## 2019-11-15 LAB — PARATHYROID HORMONE, INTACT (NO CA): PTH: 55 pg/mL (ref 15–65)

## 2019-11-29 ENCOUNTER — Other Ambulatory Visit: Payer: Self-pay

## 2019-11-29 ENCOUNTER — Encounter: Payer: Medicare PPO | Attending: Internal Medicine | Admitting: Nutrition

## 2019-11-29 VITALS — Ht 61.0 in | Wt 193.0 lb

## 2019-11-29 DIAGNOSIS — N184 Chronic kidney disease, stage 4 (severe): Secondary | ICD-10-CM | POA: Diagnosis present

## 2019-11-29 DIAGNOSIS — K7581 Nonalcoholic steatohepatitis (NASH): Secondary | ICD-10-CM | POA: Insufficient documentation

## 2019-11-29 DIAGNOSIS — E119 Type 2 diabetes mellitus without complications: Secondary | ICD-10-CM | POA: Insufficient documentation

## 2019-11-29 NOTE — Patient Instructions (Addendum)
Goals  Cut out diet soda and drink only water Eat three meals per day Don't skips meals. Skipping meals makes your blood sugar worse. Increase fresh fruits and vegetables. Walk 15 minutes three times per day Get A1C  7%.

## 2019-11-29 NOTE — Progress Notes (Signed)
.   Medical Nutrition Therapy:  Appt start time: 1000 end time:  1030  Assessment:  Primary concerns today: Diabetes and CKD and Fatty Liver.. Lives with her husband. 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 A1C up  A1C 7.8% up from 6.8%. Tresiba 44 units a day. She notes she doesn't eat meals because she isn't hungry. Doesn't wantt to gain weight and make her blood sugars go up. Said she gained 20+ lbs when taking insulin and  Victoza 1.8 mg/dl and Glipizide. Not exercising. Admits to eating late at night.  Drinks mostly diet sodas. CKD is getting worse, Creatine 1.75 mg/dl, up from 1.54 mg/dl.  Vitals with BMI 07/27/2019 07/03/2019  Height 5' 1"    Weight 193 lbs 189 lbs 3 oz  BMI 95.62   Systolic  130  Diastolic  60  Pulse  72   .  Fatty  LIver. Suppose to be on a list for liver transplant but has to get A1C down.  CMP Latest Ref Rng & Units 11/14/2019 09/27/2019 08/25/2019  Glucose 70 - 99 mg/dL 201(H) 226(H) 237(H)  BUN 8 - 23 mg/dL 18 17 21   Creatinine 0.44 - 1.00 mg/dL 1.75(H) 1.54(H) 1.88(H)  Sodium 135 - 145 mmol/L 137 138 140  Potassium 3.5 - 5.1 mmol/L 4.3 4.3 4.7  Chloride 98 - 111 mmol/L 107 109 111  CO2 22 - 32 mmol/L 25 23 22   Calcium 8.9 - 10.3 mg/dL 8.8(L) 8.8(L) 8.6(L)  Total Protein 6.5 - 8.1 g/dL - 6.6 -  Total Bilirubin 0.3 - 1.2 mg/dL - 2.1(H) -  Alkaline Phos 38 - 126 U/L - 79 -  AST 15 - 41 U/L - 22 -  ALT 0 - 44 U/L - 15 -    Preferred Learning Style:     No preference indicated   Learning Readiness:   Ready  Change in progress   MEDICATIONS:   DIETARY INTAKE:  B)skipped  Diet soda, water L) Fried fish, potato 1/2, Sweet tea Dinner: Popcorn 1-2 cups, Diet soda.   Usual physical activity: walks some  Estimated energy needs: 1200  calories 135 g carbohydrates 90 g protein 50 g fat  Progress Towards Goal(s):  In progress.   Nutritional Diagnosis:  NB-1.1 Food and nutrition-related knowledge deficit As related to Diabetes and CKD and  Obesity.  As evidenced by A1C 8.6%.    Intervention: Nutrition and Diabetes education provided on My Plate, CHO counting, meal planning, portion sizes, timing of meals, avoiding snacks between meals unless having a low blood sugar, target ranges for A1C and blood sugars, signs/symptoms and treatment of hyper/hypoglycemia, monitoring blood sugars, taking medications as prescribed, benefits of exercising 30 minutes per day and prevention of complications of DM.  Goals  Cut out diet soda and drink only water Eat three meals per day Don't skips meals. Skipping meals makes your blood sugar worse. Increase fresh fruits and vegetables. Walk 15 minutes three times per day Get A1C  7%  Teaching Method Utilized:  Visual Auditory Hands on  Handouts given during visit include: Diabetes Plate Method, Diabetes Instructions, Meal Plans.   Barriers to learning/adherence to lifestyle change: none  Demonstrated degree of understanding via:  Teach Back   Monitoring/Evaluation:  Dietary intake, exercise, and body weight in 3 month(s) Needs a lot of reinforcement about eating 3 balanced meals and not skipping meals to improve her DM and CKD. She would benefit from seeing a Endocrinologist.

## 2020-01-04 DIAGNOSIS — Z1231 Encounter for screening mammogram for malignant neoplasm of breast: Secondary | ICD-10-CM | POA: Diagnosis not present

## 2020-01-12 DIAGNOSIS — E1165 Type 2 diabetes mellitus with hyperglycemia: Secondary | ICD-10-CM | POA: Diagnosis not present

## 2020-01-25 ENCOUNTER — Other Ambulatory Visit (HOSPITAL_COMMUNITY)
Admission: RE | Admit: 2020-01-25 | Discharge: 2020-01-25 | Disposition: A | Discharge status: Home / Self Care | Payer: Medicare PPO | Source: Ambulatory Visit | Attending: Nephrology | Admitting: Nephrology

## 2020-01-25 ENCOUNTER — Inpatient Hospital Stay (HOSPITAL_COMMUNITY): Payer: Medicare PPO | Attending: Hematology

## 2020-01-25 DIAGNOSIS — E1129 Type 2 diabetes mellitus with other diabetic kidney complication: Secondary | ICD-10-CM | POA: Diagnosis not present

## 2020-01-25 DIAGNOSIS — K766 Portal hypertension: Secondary | ICD-10-CM | POA: Insufficient documentation

## 2020-01-25 DIAGNOSIS — E7800 Pure hypercholesterolemia, unspecified: Secondary | ICD-10-CM | POA: Diagnosis not present

## 2020-01-25 DIAGNOSIS — Z299 Encounter for prophylactic measures, unspecified: Secondary | ICD-10-CM | POA: Diagnosis not present

## 2020-01-25 DIAGNOSIS — D509 Iron deficiency anemia, unspecified: Secondary | ICD-10-CM | POA: Insufficient documentation

## 2020-01-25 DIAGNOSIS — N189 Chronic kidney disease, unspecified: Secondary | ICD-10-CM | POA: Insufficient documentation

## 2020-01-25 DIAGNOSIS — K219 Gastro-esophageal reflux disease without esophagitis: Secondary | ICD-10-CM | POA: Insufficient documentation

## 2020-01-25 DIAGNOSIS — E785 Hyperlipidemia, unspecified: Secondary | ICD-10-CM | POA: Insufficient documentation

## 2020-01-25 DIAGNOSIS — E1122 Type 2 diabetes mellitus with diabetic chronic kidney disease: Secondary | ICD-10-CM | POA: Diagnosis not present

## 2020-01-25 DIAGNOSIS — Z1211 Encounter for screening for malignant neoplasm of colon: Secondary | ICD-10-CM | POA: Diagnosis not present

## 2020-01-25 DIAGNOSIS — I10 Essential (primary) hypertension: Secondary | ICD-10-CM | POA: Diagnosis not present

## 2020-01-25 DIAGNOSIS — Z6834 Body mass index (BMI) 34.0-34.9, adult: Secondary | ICD-10-CM | POA: Diagnosis not present

## 2020-01-25 DIAGNOSIS — Z8719 Personal history of other diseases of the digestive system: Secondary | ICD-10-CM | POA: Insufficient documentation

## 2020-01-25 DIAGNOSIS — Z800 Family history of malignant neoplasm of digestive organs: Secondary | ICD-10-CM | POA: Insufficient documentation

## 2020-01-25 DIAGNOSIS — E211 Secondary hyperparathyroidism, not elsewhere classified: Secondary | ICD-10-CM | POA: Diagnosis not present

## 2020-01-25 DIAGNOSIS — D731 Hypersplenism: Secondary | ICD-10-CM | POA: Diagnosis not present

## 2020-01-25 DIAGNOSIS — R809 Proteinuria, unspecified: Secondary | ICD-10-CM | POA: Diagnosis not present

## 2020-01-25 DIAGNOSIS — Z1331 Encounter for screening for depression: Secondary | ICD-10-CM | POA: Diagnosis not present

## 2020-01-25 DIAGNOSIS — Z1339 Encounter for screening examination for other mental health and behavioral disorders: Secondary | ICD-10-CM | POA: Diagnosis not present

## 2020-01-25 DIAGNOSIS — R944 Abnormal results of kidney function studies: Secondary | ICD-10-CM | POA: Insufficient documentation

## 2020-01-25 DIAGNOSIS — Z79899 Other long term (current) drug therapy: Secondary | ICD-10-CM | POA: Diagnosis not present

## 2020-01-25 DIAGNOSIS — I129 Hypertensive chronic kidney disease with stage 1 through stage 4 chronic kidney disease, or unspecified chronic kidney disease: Secondary | ICD-10-CM | POA: Insufficient documentation

## 2020-01-25 DIAGNOSIS — K7460 Unspecified cirrhosis of liver: Secondary | ICD-10-CM | POA: Diagnosis not present

## 2020-01-25 DIAGNOSIS — Z7189 Other specified counseling: Secondary | ICD-10-CM | POA: Diagnosis not present

## 2020-01-25 DIAGNOSIS — E538 Deficiency of other specified B group vitamins: Secondary | ICD-10-CM | POA: Diagnosis not present

## 2020-01-25 DIAGNOSIS — D696 Thrombocytopenia, unspecified: Secondary | ICD-10-CM | POA: Insufficient documentation

## 2020-01-25 DIAGNOSIS — D631 Anemia in chronic kidney disease: Secondary | ICD-10-CM | POA: Insufficient documentation

## 2020-01-25 DIAGNOSIS — Z0000 Encounter for general adult medical examination without abnormal findings: Secondary | ICD-10-CM | POA: Diagnosis not present

## 2020-01-25 DIAGNOSIS — R5383 Other fatigue: Secondary | ICD-10-CM | POA: Diagnosis not present

## 2020-01-25 DIAGNOSIS — E1165 Type 2 diabetes mellitus with hyperglycemia: Secondary | ICD-10-CM | POA: Diagnosis not present

## 2020-01-25 LAB — IRON AND TIBC
Iron: 91 ug/dL (ref 28–170)
Iron: 93 ug/dL (ref 28–170)
Saturation Ratios: 30 % (ref 10.4–31.8)
Saturation Ratios: 31 % (ref 10.4–31.8)
TIBC: 300 ug/dL (ref 250–450)
TIBC: 299 ug/dL (ref 250–450)
UIBC: 209 ug/dL
UIBC: 206 ug/dL

## 2020-01-25 LAB — PROTEIN / CREATININE RATIO, URINE
Creatinine, Urine: 272.46 mg/dL
Protein Creatinine Ratio: 0.06 mg/mg{Cre} (ref 0.00–0.15)
Total Protein, Urine: 15 mg/dL

## 2020-01-25 LAB — CBC WITH DIFFERENTIAL/PLATELET
Abs Immature Granulocytes: 0.02 10*3/uL (ref 0.00–0.07)
Basophils Absolute: 0 10*3/uL (ref 0.0–0.1)
Basophils Relative: 1 %
Eosinophils Absolute: 0.1 10*3/uL (ref 0.0–0.5)
Eosinophils Relative: 2 %
HCT: 35.9 % — ABNORMAL LOW (ref 36.0–46.0)
Hemoglobin: 11.9 g/dL — ABNORMAL LOW (ref 12.0–15.0)
Immature Granulocytes: 1 %
Lymphocytes Relative: 23 %
Lymphs Abs: 1 10*3/uL (ref 0.7–4.0)
MCH: 29.5 pg (ref 26.0–34.0)
MCHC: 33.1 g/dL (ref 30.0–36.0)
MCV: 88.9 fL (ref 80.0–100.0)
Monocytes Absolute: 0.3 10*3/uL (ref 0.1–1.0)
Monocytes Relative: 8 %
Neutro Abs: 2.7 10*3/uL (ref 1.7–7.7)
Neutrophils Relative %: 65 %
Platelets: 51 10*3/uL — ABNORMAL LOW (ref 150–400)
RBC: 4.04 MIL/uL (ref 3.87–5.11)
RDW: 14.6 % (ref 11.5–15.5)
WBC: 4.2 10*3/uL (ref 4.0–10.5)
nRBC: 0 % (ref 0.0–0.2)

## 2020-01-25 LAB — BASIC METABOLIC PANEL
BUN: 15 (ref 4–21)
Creatinine: 1.7 — AB (ref <=1.1)

## 2020-01-25 LAB — RENAL FUNCTION PANEL
Albumin: 3.8 g/dL (ref 3.5–5.0)
Anion gap: 7 (ref 5–15)
BUN: 18 mg/dL (ref 8–23)
CO2: 25 mmol/L (ref 22–32)
Calcium: 8.9 mg/dL (ref 8.9–10.3)
Chloride: 105 mmol/L (ref 98–111)
Creatinine, Ser: 1.64 mg/dL — ABNORMAL HIGH (ref 0.44–1.00)
GFR calc Af Amer: 37 mL/min — ABNORMAL LOW (ref >60)
GFR calc non Af Amer: 32 mL/min — ABNORMAL LOW (ref >60)
Glucose, Bld: 166 mg/dL — ABNORMAL HIGH (ref 70–99)
Phosphorus: 3.4 mg/dL (ref 2.5–4.6)
Potassium: 4.1 mmol/L (ref 3.5–5.1)
Sodium: 137 mmol/L (ref 135–145)

## 2020-01-25 LAB — FOLATE: Folate: 11.4 ng/mL (ref >5.9)

## 2020-01-25 LAB — TSH: TSH: 2.46 (ref 0.41–5.90)

## 2020-01-25 LAB — VITAMIN D 25 HYDROXY (VIT D DEFICIENCY, FRACTURES): Vit D, 25-Hydroxy: 34.25 ng/mL (ref 30–100)

## 2020-01-25 LAB — FERRITIN: Ferritin: 135 ng/mL (ref 11–307)

## 2020-01-25 LAB — VITAMIN B12: Vitamin B-12: 1491 pg/mL — ABNORMAL HIGH (ref 180–914)

## 2020-01-26 LAB — LIPID PANEL
Cholesterol: 144 (ref 0–200)
HDL: 28 — AB (ref 35–70)
LDL Cholesterol: 67
Triglycerides: 239 — AB (ref 40–160)

## 2020-01-26 LAB — PTH, INTACT AND CALCIUM
Calcium, Total (PTH): 9.1 mg/dL (ref 8.7–10.3)
PTH: 36 pg/mL (ref 15–65)

## 2020-01-29 DIAGNOSIS — E1129 Type 2 diabetes mellitus with other diabetic kidney complication: Secondary | ICD-10-CM | POA: Diagnosis not present

## 2020-01-29 DIAGNOSIS — D631 Anemia in chronic kidney disease: Secondary | ICD-10-CM | POA: Diagnosis not present

## 2020-01-29 DIAGNOSIS — E559 Vitamin D deficiency, unspecified: Secondary | ICD-10-CM | POA: Diagnosis not present

## 2020-01-29 DIAGNOSIS — E211 Secondary hyperparathyroidism, not elsewhere classified: Secondary | ICD-10-CM | POA: Diagnosis not present

## 2020-01-29 DIAGNOSIS — R809 Proteinuria, unspecified: Secondary | ICD-10-CM | POA: Diagnosis not present

## 2020-01-29 DIAGNOSIS — E1122 Type 2 diabetes mellitus with diabetic chronic kidney disease: Secondary | ICD-10-CM | POA: Diagnosis not present

## 2020-01-29 DIAGNOSIS — N189 Chronic kidney disease, unspecified: Secondary | ICD-10-CM | POA: Diagnosis not present

## 2020-01-29 DIAGNOSIS — I10 Essential (primary) hypertension: Secondary | ICD-10-CM | POA: Diagnosis not present

## 2020-01-30 ENCOUNTER — Inpatient Hospital Stay (HOSPITAL_COMMUNITY): Payer: Medicare PPO

## 2020-02-01 ENCOUNTER — Other Ambulatory Visit: Payer: Self-pay

## 2020-02-01 ENCOUNTER — Inpatient Hospital Stay (HOSPITAL_COMMUNITY): Payer: Medicare PPO | Admitting: Nurse Practitioner

## 2020-02-01 DIAGNOSIS — E1165 Type 2 diabetes mellitus with hyperglycemia: Secondary | ICD-10-CM | POA: Diagnosis not present

## 2020-02-01 DIAGNOSIS — E7800 Pure hypercholesterolemia, unspecified: Secondary | ICD-10-CM | POA: Diagnosis not present

## 2020-02-01 DIAGNOSIS — D500 Iron deficiency anemia secondary to blood loss (chronic): Secondary | ICD-10-CM

## 2020-02-01 DIAGNOSIS — D509 Iron deficiency anemia, unspecified: Secondary | ICD-10-CM | POA: Diagnosis not present

## 2020-02-01 DIAGNOSIS — I10 Essential (primary) hypertension: Secondary | ICD-10-CM | POA: Diagnosis not present

## 2020-02-01 DIAGNOSIS — Z299 Encounter for prophylactic measures, unspecified: Secondary | ICD-10-CM | POA: Diagnosis not present

## 2020-02-01 DIAGNOSIS — I8500 Esophageal varices without bleeding: Secondary | ICD-10-CM | POA: Diagnosis not present

## 2020-02-01 DIAGNOSIS — Z6835 Body mass index (BMI) 35.0-35.9, adult: Secondary | ICD-10-CM | POA: Diagnosis not present

## 2020-02-01 NOTE — Patient Instructions (Signed)
[facility] at [facility] Discharge Instructions  Follow up in 4 months with labs    Thank you for choosing [facility] at [facility] to provide your oncology and hematology care.  To afford each patient quality time with our provider, please arrive at least 15 minutes before your scheduled appointment time.   If you have a lab appointment with the Cancer Center please come in thru the Main Entrance and check in at the main information desk.  You need to re-schedule your appointment should you arrive 10 or more minutes late.  We strive to give you quality time with our providers, and arriving late affects you and other patients whose appointments are after yours.  Also, if you no show three or more times for appointments you may be dismissed from the clinic at the providers discretion.     Again, thank you for choosing [facility].  Our hope is that these requests will decrease the amount of time that you wait before being seen by our physicians.       _____________________________________________________________  Should you have questions after your visit to  Cancer Center, please contact our office at [phone] between the hours of 8:00 a.m. and 4:30 p.m.  Voicemails left after 4:00 p.m. will not be returned until the following business day.  For prescription refill requests, have your pharmacy contact our office and allow 72 hours.    Due to Covid, you will need to wear a mask upon entering the hospital. If you do not have a mask, a mask will be given to you at the Main Entrance upon arrival. For doctor visits, patients may have 1 support person with them. For treatment visits, patients can not have anyone with them due to social distancing guidelines and our immunocompromised population.      

## 2020-02-01 NOTE — Assessment & Plan Note (Addendum)
1.  Normocytic anemia: -This is from a combination of CKD, chronic blood loss, and iron deficiency. -Last Feraheme was on 10/05/2018. - She had labs on 01/25/2020 which showed her ferritin 135, and her percent saturation 30.  Her hemoglobin is 11.9. -I do not recommend any iron infusions at this time. - She will follow-up in 4 months with repeat labs.  2.  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 - Platelet count is more or less stable and stays around 50. - Labs on 01/25/2020 showed platelet count 51  3.  Elevated creatinine: - Labs drawn on 01/25/2020 showed creatinine of 1.64 - This is stable at this time we will continue to monitor.  4.  Cirrhosis: - She follows up with the liver transplant program at [facility]. -Her last MRI of the abdomen without contrast on 10/04/2018 showed she had cirrhosis and portal venous hypertension, without suspicious liver lesion.  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 -She reports she goes back to [facility] on Monday, 02/05/2020 for follow-up.  5.  B12 deficiency: - Patient takes B12 injections monthly. -Patient wanted to discontinue monthly injections she has now taking B12 1 mg daily orally. -Labs done on 01/25/2020 showed B12 level 1491  -She will continue taking the oral form as it is working for her.

## 2020-02-01 NOTE — Progress Notes (Signed)
[place] [place], [place] [address]   CLINIC:  Medical Oncology/Hematology  PCP:  Alisha Chroman, MD [place] [address] [phone]   REASON FOR VISIT: Follow-up for normocytic anemia and thrombocytopenia due to cirrhosis.   CURRENT THERAPY: Occasional iron transfusions   INTERVAL HISTORY:  Alisha [name] 69 y.o. female returns for routine follow-up for normocytic anemia and thrombocytopenia.  Patient reports she has been doing well since her last visit.  She reports her energy levels are good at this time.  She denies any bright red bleeding per rectum or melena.   Denies any nausea, vomiting, or diarrhea. Denies any new pains. Had not noticed any recent bleeding such as epistaxis, hematuria or hematochezia. Denies recent chest pain on exertion, shortness of breath on minimal exertion, pre-syncopal episodes, or palpitations. Denies any numbness or tingling in hands or feet. Denies any recent fevers, infections, or recent hospitalizations. Patient reports appetite at 50% and energy level at 50%.  She is eating well maintain her weight at this time.    REVIEW OF SYSTEMS:  Review of Systems  Constitutional: Positive for fatigue.  Hematological: Bruises/bleeds easily.  All other systems reviewed and are negative.    PAST MEDICAL/SURGICAL HISTORY:  Past Medical History:  Diagnosis Date  . B12 deficiency 11/03/2016  . B12 deficiency 11/03/2016  . Barrett's esophagus   . Cirrhosis of liver without mention of alcohol    hep B surface antigen and HCV ab negative.pt has not had hepatitis A and B vaccines.U/S on 07/04/13 shows cirrhosis.  . Diverticula of colon    pancolonic  . Esophageal reflux   . Esophageal varices ([place])   . Hiatal hernia   . Iron deficiency anemia, unspecified   . Other and unspecified hyperlipidemia   . Portal hypertensive gastropathy ([place])   . Tubular adenoma   . Type II or unspecified type diabetes mellitus without  mention of complication, not stated as uncontrolled   . Unspecified essential hypertension   . Unspecified hemorrhoids without mention of complication    Past Surgical History:  Procedure Laterality Date  . ABDOMINAL HYSTERECTOMY    . BIOPSY  04/30/2016   Procedure: BIOPSY;  Surgeon: Daneil Dolin, MD;  [facility];  Service: Endoscopy;;  esophagus  . BIOPSY  02/24/2019   Procedure: BIOPSY;  Surgeon: Daneil Dolin, MD;  [facility];  Service: Endoscopy;;  . CESAREAN SECTION     x 2  . COLONOSCOPY  03/2006   left sided diverticula, splenic flexure tublar adenoma  . COLONOSCOPY  07/2002   villous tubular adenoma in rectum  . COLONOSCOPY  09/23/09   external hemorrhoids/scattered pan colonic diverticula otherwise normal. cecal lipoma bx negative, normal TI. Next TCS 09/2014  . COLONOSCOPY N/A 11/12/2014   Procedure: COLONOSCOPY;  Surgeon: Daneil Dolin, MD;  [facility];  Service: Endoscopy;  Laterality: N/A;  1215 - moved to 12:30 - Ginger to notify pt  . COLONOSCOPY N/A 01/07/2017   Procedure: COLONOSCOPY;  Surgeon: Daneil Dolin, MD;  [facility];  Service: Endoscopy;  Laterality: N/A;  3:15PM  . ESOPHAGEAL BANDING N/A 12/20/2016   Procedure: ESOPHAGEAL BANDING;  Surgeon: Danie Binder, MD;  [facility];  Service: Endoscopy;  Laterality: N/A;  . ESOPHAGEAL BANDING N/A 03/18/2017   Procedure: ESOPHAGEAL BANDING;  Surgeon: Daneil Dolin, MD;  [facility];  Service: Endoscopy;  Laterality: N/A;  .  ESOPHAGEAL BANDING  12/17/2017   Procedure: ESOPHAGEAL BANDING;  Surgeon: Daneil Dolin, MD;  [facility];  Service: Endoscopy;;  . ESOPHAGOGASTRODUODENOSCOPY  08/2006   barretts esophagus, no dyplasia  . ESOPHAGOGASTRODUODENOSCOPY  03/2006   barretts esophagus,bx focal atypia c/w low grade dysplasia, SB bx negative for celiac  . ESOPHAGOGASTRODUODENOSCOPY  09/23/09   3 columns of grade 2 esophageal varices/hiatal  hernia/3-4 cm segment Barrett's esophagus without dysplasia. Next EGD 09/2012  . ESOPHAGOGASTRODUODENOSCOPY N/A 11/11/2012   Dr. Gala Romney- Barrett;s esophagus, esophageal varices, portal gastopathy. hiatal hernia  . ESOPHAGOGASTRODUODENOSCOPY N/A 04/30/2016   Procedure: ESOPHAGOGASTRODUODENOSCOPY (EGD);  Surgeon: Daneil Dolin, MD;  [facility];  Service: Endoscopy;  Laterality: N/A;  815  . ESOPHAGOGASTRODUODENOSCOPY N/A 12/20/2016   Procedure: ESOPHAGOGASTRODUODENOSCOPY (EGD);  Surgeon: Danie Binder, MD;  [facility];  Service: Endoscopy;  Laterality: N/A;  . ESOPHAGOGASTRODUODENOSCOPY N/A 01/07/2017   Procedure: ESOPHAGOGASTRODUODENOSCOPY (EGD);  Surgeon: Daneil Dolin, MD;  [facility];  Service: Endoscopy;  Laterality: N/A;  . ESOPHAGOGASTRODUODENOSCOPY N/A 03/18/2017   Procedure: ESOPHAGOGASTRODUODENOSCOPY (EGD);  Surgeon: Daneil Dolin, MD;  [facility];  Service: Endoscopy;  Laterality: N/A;  1030   . ESOPHAGOGASTRODUODENOSCOPY N/A 12/17/2017   Procedure: ESOPHAGOGASTRODUODENOSCOPY (EGD);  Surgeon: Daneil Dolin, MD;  [facility];  Service: Endoscopy;  Laterality: N/A;  10:30am  . ESOPHAGOGASTRODUODENOSCOPY N/A 02/24/2019   Procedure: ESOPHAGOGASTRODUODENOSCOPY (EGD);  Surgeon: Daneil Dolin, MD;  [facility];  Service: Endoscopy;  Laterality: N/A;  7:30am  . HEMORRHOID SURGERY  2008  . small bowel capsule endoscopy  10/2009   normal     SOCIAL HISTORY:  Social History   Socioeconomic History  . Marital status: Married    Spouse name: Not on file  . Number of children: Not on file  . Years of education: Not on file  . Highest education level: Not on file  Occupational History  . Not on file  Tobacco Use  . Smoking status: Never Smoker  . Smokeless tobacco: Never Used  Substance and Sexual Activity  . Alcohol use: No  . Drug use: No  . Sexual activity: Yes  Other Topics Concern  . Not on file  Social  History Narrative  . Not on file   Social Determinants of Health   Financial Resource Strain:   . Difficulty of Paying Living Expenses:   Food Insecurity:   . Worried About Charity fundraiser in the Last Year:   . Arboriculturist in the Last Year:   Transportation Needs:   . Film/video editor (Medical):   Marland Kitchen Lack of Transportation (Non-Medical):   Physical Activity:   . Days of Exercise per Week:   . Minutes of Exercise per Session:   Stress:   . Feeling of Stress :   Social Connections:   . Frequency of Communication with Friends and Family:   . Frequency of Social Gatherings with Friends and Family:   . Attends Religious Services:   . Active Member of Clubs or Organizations:   . Attends Archivist Meetings:   Marland Kitchen Marital Status:   Intimate Partner Violence:   . Fear of Current or Ex-Partner:   . Emotionally Abused:   Marland Kitchen Physically Abused:   . Sexually Abused:     FAMILY HISTORY:  Family History  Problem Relation Age of Onset  . Alzheimer's disease Mother   . CAD Father   .  Diabetes Mellitus II Father   . Alzheimer's disease Father   . Diabetes Mellitus II Sister   . Colon cancer Sister 61       small, surgical excision; chemo/rad not needed  . Cancer - Other Brother   . Diabetes Mellitus II Brother   . Hypertension Brother   . Liver disease Neg Hx     CURRENT MEDICATIONS:  Outpatient Encounter Medications as of 02/01/2020  Medication Sig Note  . ACCU-CHEK SOFTCLIX LANCETS lancets USE TO CHECK BLOOD SUGAR TWICE DAILY.   Marland Kitchen Cholecalciferol (VITAMIN D) 2000 units tablet Take 2,000 Units by mouth daily.   . cyanocobalamin (,VITAMIN B-12,) 1000 MCG/ML injection Inject 1,000 mcg into the muscle every 30 (thirty) days.  12/13/2018: LAST DOSE:12/07/2018  . GLIPIZIDE XL 10 MG 24 hr tablet Take 10 mg by mouth daily.    Marland Kitchen lisinopril (PRINIVIL,ZESTRIL) 10 MG tablet Take 10 mg by mouth daily.    Marland Kitchen NOVOFINE 32G X 6 MM MISC USE ONE AS DIRECTED TWICE DAILY.   Marland Kitchen  NOVOTWIST 32G X 5 MM MISC USE ONE AS DIRECTED TWICE DAILY.   . pantoprazole (PROTONIX) 40 MG tablet Take 1 tablet (40 mg total) by mouth 2 (two) times daily before a meal.   . propranolol (INDERAL) 10 MG tablet TAKE THREE (3) TABLETS BY MOUTH EVERY MORNING AND 3 TABLETS EVERY EVENING.   . TRESIBA FLEXTOUCH 200 UNIT/ML SOPN Inject 44 Units into the skin at bedtime. Uses sliding scale once a day   . ULTICARE PEN NEEDLES 31G X 5 MM MISC USE ONE AS DIRECTED TWICE DAILY.   Marland Kitchen VICTOZA 18 MG/3ML SOLN Inject 1.8 mg into the skin daily.    . [DISCONTINUED] glucose blood test strip 1 each by Other route 2 (two) times daily. Use as instructed    No facility-administered encounter medications on file as of 02/01/2020.    ALLERGIES:  Allergies  Allergen Reactions  . Codeine Nausea And Vomiting     PHYSICAL EXAM:  ECOG Performance status: 1  Vitals:   02/01/20 1024  BP: (!) 135/59  Pulse: 71  Resp: 18  Temp: 97.8 F (36.6 C)  SpO2: 99%   Filed Weights   02/01/20 1024  Weight: 195 lb (88.5 kg)      Physical Exam Constitutional:      Appearance: Normal appearance. She is normal weight.  Cardiovascular:     Rate and Rhythm: Normal rate and regular rhythm.     Heart sounds: Normal heart sounds.  Pulmonary:     Effort: Pulmonary effort is normal.     Breath sounds: Normal breath sounds.  Abdominal:     General: Bowel sounds are normal.     Palpations: Abdomen is soft.  Musculoskeletal:        General: Normal range of motion.  Skin:    General: Skin is warm.  Neurological:     Mental Status: She is alert and oriented to person, place, and time. Mental status is at baseline.  Psychiatric:        Mood and Affect: Mood normal.        Behavior: Behavior normal.        Thought Content: Thought content normal.        Judgment: Judgment normal.      LABORATORY DATA:  I have reviewed the labs as listed.  CBC    Component Value Date/Time   WBC 4.2 01/25/2020 1059   RBC 4.04  01/25/2020 1059   HGB 11.9 (  L) 01/25/2020 1059   HCT 35.9 (L) 01/25/2020 1059   PLT 51 (L) 01/25/2020 1059   MCV 88.9 01/25/2020 1059   MCH 29.5 01/25/2020 1059   MCHC 33.1 01/25/2020 1059   RDW 14.6 01/25/2020 1059   LYMPHSABS 1.0 01/25/2020 1059   MONOABS 0.3 01/25/2020 1059   EOSABS 0.1 01/25/2020 1059   BASOSABS 0.0 01/25/2020 1059   CMP Latest Ref Rng & Units 01/25/2020 01/25/2020 11/14/2019  Glucose 70 - 99 mg/dL 166(H) - 201(H)  BUN 8 - 23 mg/dL 18 - 18  Creatinine 0.44 - 1.00 mg/dL 1.64(H) - 1.75(H)  Sodium 135 - 145 mmol/L 137 - 137  Potassium 3.5 - 5.1 mmol/L 4.1 - 4.3  Chloride 98 - 111 mmol/L 105 - 107  CO2 22 - 32 mmol/L 25 - 25  Calcium 8.7 - 10.3 mg/dL 8.9 9.1 8.8(L)  Total Protein 6.5 - 8.1 g/dL - - -  Total Bilirubin 0.3 - 1.2 mg/dL - - -  Alkaline Phos 38 - 126 U/L - - -  AST 15 - 41 U/L - - -  ALT 0 - 44 U/L - - -    All questions were answered to patient's stated satisfaction. Encouraged patient to call with any new concerns or questions before his next visit to the cancer center and we can certain see him sooner, if needed.     ASSESSMENT & PLAN:  Iron deficiency anemia 1.  Normocytic anemia: -This is from a combination of CKD, chronic blood loss, and iron deficiency. -Last Feraheme was on 10/05/2018. - She had labs on 01/25/2020 which showed her ferritin 135, and her percent saturation 30.  Her hemoglobin is 11.9. -I do not recommend any iron infusions at this time. - She will follow-up in 4 months with repeat labs.  2.  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 - Platelet count is more or less stable and stays around 50. - Labs on 01/25/2020 showed platelet count 51  3.  Elevated creatinine: - Labs drawn on 01/25/2020 showed creatinine of 1.64 - This is stable at this time we will continue to monitor.  4.  Cirrhosis: - She follows up with the liver transplant program at [facility]. -Her last MRI  of the abdomen without contrast on 10/04/2018 showed she had cirrhosis and portal venous hypertension, without suspicious liver lesion.  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 -She reports she goes back to [facility] on Monday, 02/05/2020 for follow-up.  5.  B12 deficiency: - Patient takes B12 injections monthly. -Patient wanted to discontinue monthly injections she has now taking B12 1 mg daily orally. -Labs done on 01/25/2020 showed B12 level 1491  -She will continue taking the oral form as it is working for her.     Orders placed this encounter:  Orders Placed This Encounter  Procedures  . Lactate dehydrogenase  . CBC with Differential/Platelet  . Comprehensive metabolic panel  . Ferritin  . Iron and TIBC  . Vitamin B12  . VITAMIN D 25 Hydroxy (Vit-D Deficiency, Fractures)  . Folate      Alisha Finders, Alisha [name] [place] [phone]

## 2020-02-06 DIAGNOSIS — K7469 Other cirrhosis of liver: Secondary | ICD-10-CM | POA: Diagnosis not present

## 2020-02-06 DIAGNOSIS — K766 Portal hypertension: Secondary | ICD-10-CM | POA: Diagnosis not present

## 2020-02-06 DIAGNOSIS — K7581 Nonalcoholic steatohepatitis (NASH): Secondary | ICD-10-CM | POA: Diagnosis not present

## 2020-02-06 DIAGNOSIS — K7460 Unspecified cirrhosis of liver: Secondary | ICD-10-CM | POA: Diagnosis not present

## 2020-02-06 DIAGNOSIS — N289 Disorder of kidney and ureter, unspecified: Secondary | ICD-10-CM | POA: Diagnosis not present

## 2020-02-06 DIAGNOSIS — E119 Type 2 diabetes mellitus without complications: Secondary | ICD-10-CM | POA: Diagnosis not present

## 2020-02-06 DIAGNOSIS — I864 Gastric varices: Secondary | ICD-10-CM | POA: Diagnosis not present

## 2020-02-06 DIAGNOSIS — R772 Abnormality of alphafetoprotein: Secondary | ICD-10-CM | POA: Diagnosis not present

## 2020-02-11 DIAGNOSIS — E1165 Type 2 diabetes mellitus with hyperglycemia: Secondary | ICD-10-CM | POA: Diagnosis not present

## 2020-02-13 ENCOUNTER — Telehealth: Payer: Self-pay | Admitting: Internal Medicine

## 2020-02-13 NOTE — Telephone Encounter (Signed)
PATIENT ON RECALL FOR MRI/MRCP

## 2020-02-13 NOTE — Telephone Encounter (Signed)
Randall Hiss, she had MRI abd 07/2019. Does she need recall MRI/MRCP at this time?

## 2020-02-14 ENCOUNTER — Other Ambulatory Visit: Payer: Self-pay | Admitting: Gastroenterology

## 2020-02-14 NOTE — Telephone Encounter (Signed)
She does not. I reviewed the MRI report and they recommended a 2 year repeat. Can we change the recall to 07/2021?  Thanks for checking!

## 2020-02-15 ENCOUNTER — Encounter: Payer: Self-pay | Admitting: Internal Medicine

## 2020-02-15 DIAGNOSIS — H43811 Vitreous degeneration, right eye: Secondary | ICD-10-CM | POA: Diagnosis not present

## 2020-02-15 DIAGNOSIS — Z961 Presence of intraocular lens: Secondary | ICD-10-CM | POA: Diagnosis not present

## 2020-02-15 DIAGNOSIS — E113293 Type 2 diabetes mellitus with mild nonproliferative diabetic retinopathy without macular edema, bilateral: Secondary | ICD-10-CM | POA: Diagnosis not present

## 2020-02-15 DIAGNOSIS — H47093 Other disorders of optic nerve, not elsewhere classified, bilateral: Secondary | ICD-10-CM | POA: Diagnosis not present

## 2020-02-15 DIAGNOSIS — H43813 Vitreous degeneration, bilateral: Secondary | ICD-10-CM | POA: Diagnosis not present

## 2020-02-15 DIAGNOSIS — H02403 Unspecified ptosis of bilateral eyelids: Secondary | ICD-10-CM | POA: Diagnosis not present

## 2020-02-15 DIAGNOSIS — H26493 Other secondary cataract, bilateral: Secondary | ICD-10-CM | POA: Diagnosis not present

## 2020-02-15 DIAGNOSIS — Z7984 Long term (current) use of oral hypoglycemic drugs: Secondary | ICD-10-CM | POA: Diagnosis not present

## 2020-02-15 DIAGNOSIS — H35362 Drusen (degenerative) of macula, left eye: Secondary | ICD-10-CM | POA: Diagnosis not present

## 2020-02-15 NOTE — Telephone Encounter (Signed)
Changed nic

## 2020-02-15 NOTE — Telephone Encounter (Signed)
Please change MRI/MRCP recall to 07/2021.

## 2020-03-13 DIAGNOSIS — E1165 Type 2 diabetes mellitus with hyperglycemia: Secondary | ICD-10-CM | POA: Diagnosis not present

## 2020-03-27 ENCOUNTER — Encounter: Payer: Self-pay | Admitting: Nutrition

## 2020-03-27 ENCOUNTER — Encounter: Payer: Medicare PPO | Attending: "Endocrinology | Admitting: Nutrition

## 2020-03-27 ENCOUNTER — Other Ambulatory Visit: Payer: Self-pay

## 2020-03-27 VITALS — Ht 61.0 in | Wt 192.8 lb

## 2020-03-27 DIAGNOSIS — K7581 Nonalcoholic steatohepatitis (NASH): Secondary | ICD-10-CM | POA: Diagnosis not present

## 2020-03-27 DIAGNOSIS — E119 Type 2 diabetes mellitus without complications: Secondary | ICD-10-CM | POA: Diagnosis not present

## 2020-03-27 DIAGNOSIS — N183 Chronic kidney disease, stage 3 unspecified: Secondary | ICD-10-CM | POA: Diagnosis not present

## 2020-03-27 NOTE — Progress Notes (Signed)
. Medical Nutrition Therapy:  Appt start time: 4097 end time:  1100  Assessment:  Primary concerns today: Diabetes and CKD and Fatty Liver.. Lives with her husband. 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  A1C was 8.4%. up from 7.8%. Gained 4 lbs. Sees Dr. Woody Seller and now on 52 units of Tresiba a day. FBS:200-300's. BS are poorly controlled. She notes she vomited most of yesterday. She thinks it is because of her fatty liver. Hasn't made any changes that were suggested last time. She notes she doesn't think anything will help. Willing to see an Endocrinologist to help improve her DM. Usually only drinks sugar free tea. Not much water. Walks a little bit but doesn't like the heat. Creatine level is elevated at 1.64 mg/dl. No eGFR for evaluation.   Vitals with BMI 07/27/2019 07/03/2019  Height 5' 1"   Weight 193 lbs 189 lbs 3 oz  BMI 35.32   Systolic  992  Diastolic  60  Pulse  72   .  Fatty  LIver. Suppose to be on a list for liver transplant but has to get A1C down.  CMP Latest Ref Rng & Units 01/25/2020 01/25/2020 11/14/2019  Glucose 70 - 99 mg/dL 166(H) - 201(H)  BUN 8 - 23 mg/dL 18 - 18  Creatinine 0.44 - 1.00 mg/dL 1.64(H) - 1.75(H)  Sodium 135 - 145 mmol/L 137 - 137  Potassium 3.5 - 5.1 mmol/L 4.1 - 4.3  Chloride 98 - 111 mmol/L 105 - 107  CO2 22 - 32 mmol/L 25 - 25  Calcium 8.7 - 10.3 mg/dL 8.9 9.1 8.8(L)  Total Protein 6.5 - 8.1 g/dL - - -  Total Bilirubin 0.3 - 1.2 mg/dL - - -  Alkaline Phos 38 - 126 U/L - - -  AST 15 - 41 U/L - - -  ALT 0 - 44 U/L - - -    Preferred Learning Style:     No preference indicated   Learning Readiness:   Ready  Change in progress   MEDICATIONS:   DIETARY INTAKE: 930 Bagel thins with cream cheese, SF tea Sick most of day throwing up. 12 midnight pb sandwich.  Usual physical activity: walks some  Estimated energy needs: 1200  calories 135 g carbohydrates 90 g protein 50 g fat  Progress Towards Goal(s):  In  progress.   Nutritional Diagnosis:  NB-1.1 Food and nutrition-related knowledge deficit As related to Diabetes and CKD and Obesity.  As evidenced by A1C 8.6%.    Intervention: Nutrition and Diabetes education provided on My Plate, CHO counting, meal planning, portion sizes, timing of meals, avoiding snacks between meals unless having a low blood sugar, target ranges for A1C and blood sugars, signs/symptoms and treatment of hyper/hypoglycemia, monitoring blood sugars, taking medications as prescribed, benefits of exercising 30 minutes per day and prevention of complications of DM.  Goals  Exercise 30 minutes of walking daily.  Increase water to 5 bottles per day.  Make appt with GI regarding vomiting episodes.  Request referral to Endocrinology.  Teaching Method Utilized:  Visual Auditory Hands on  Handouts given during visit include: Diabetes Plate Method, Diabetes Instructions, Meal Plans.   Barriers to learning/adherence to lifestyle change: none  Demonstrated degree of understanding via:  Teach Back   Monitoring/Evaluation:  Dietary intake, exercise, and body weight in 3 month(s) Needs a lot of reinforcement about eating 3 balanced meals and not skipping meals to improve her DM and CKD. She would benefit from seeing a Endocrinologist  and a CKD specialist.

## 2020-03-27 NOTE — Patient Instructions (Addendum)
Goals  Exercise 30 minutes of walking daily.  Increase water to 5 bottles per day.  Make an appt with GI to discuss vomiting episodes.  Request referral to Endocrinology.

## 2020-04-02 ENCOUNTER — Ambulatory Visit: Payer: Medicare PPO | Admitting: Gastroenterology

## 2020-04-02 ENCOUNTER — Encounter: Payer: Self-pay | Admitting: Gastroenterology

## 2020-04-02 ENCOUNTER — Other Ambulatory Visit: Payer: Self-pay

## 2020-04-02 ENCOUNTER — Telehealth: Payer: Self-pay | Admitting: Gastroenterology

## 2020-04-02 VITALS — BP 136/74 | HR 75 | Temp 97.5°F | Ht 61.0 in | Wt 196.2 lb

## 2020-04-02 DIAGNOSIS — R932 Abnormal findings on diagnostic imaging of liver and biliary tract: Secondary | ICD-10-CM

## 2020-04-02 DIAGNOSIS — K2270 Barrett's esophagus without dysplasia: Secondary | ICD-10-CM | POA: Diagnosis not present

## 2020-04-02 DIAGNOSIS — K7581 Nonalcoholic steatohepatitis (NASH): Secondary | ICD-10-CM | POA: Diagnosis not present

## 2020-04-02 NOTE — Patient Instructions (Addendum)
1. Continue to follow with [facility] regarding your cirrhosis and liver lesion as scheduled.  2. We will plan on upper endoscopy in a couple of months to follow up on Barrett's esophagus and esophageal variceal screening.  3. Continue pantoprazole at lowest dose (once or twice daily) possible to control your reflux symptoms.  4. Continue propranolol 63m twice daily.  5. Return to our office in 09/2020.

## 2020-04-02 NOTE — Progress Notes (Signed)
Primary Care Physician: Glenda Chroman, MD  Primary Gastroenterologist:  Garfield Cornea, MD   Chief Complaint  Patient presents with  . Cirrhosis    doing ok    HPI: Alisha [name] is a 69 y.o. female here for follow-up of cirrhosis, Barrett's esophagus.  Last seen in January 2021.  She has a history of thrombocytopenia, secondary esophageal varices with previous banding but without bleeding, suspected Nash cirrhosis. Followed here locally and at [facility] liver transplant center.  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 Last MRI 01/2020 at [facility].MRI also showed large gastroesophageal varices.   Last appointment at [facility] liver transplant 01/2020. She has history of rising AFP levels (162) with stable Ll-RADS 3 liver lesion presented to [facility] conference in 10/2019 and lesion not felt to be in a location amenable for biopsy and advised for MRI 01/2020 (3 month follow up). Patient states they requested oncology input and plans are for MRI surveillance for now. She goes back to [facility] around 05/2020. Plans for repeat MRI in six months (07/2020). She continues on propanolol 17m TID. Plans for EGD for esophageal variceal screening and barrett's esophagus follow up originally planned for 08/2020 but will try to get done between now and the first of the year as [facility] is advising EGD now. Repeat colonoscopy due in 09/2021.    She feels well. No abdominal pain, constipation, diarrhea, melena, brbpr. No episodes of confusion or lethargy. Heartburn well controlled on pantoprazole BID. No dysphagia.   Current Outpatient Medications  Medication Sig Dispense Refill  . ACCU-CHEK SOFTCLIX LANCETS lancets USE TO CHECK BLOOD SUGAR TWICE DAILY.    .Marland KitchenCholecalciferol (VITAMIN D) 2000 units tablet Take 2,000 Units by mouth daily.    . cyanocobalamin 2000 MCG  tablet Take 2,000 mcg by mouth daily.    .Marland KitchenGLIPIZIDE XL 10 MG 24 hr tablet Take 10 mg by mouth daily.     .Marland Kitchenlisinopril (PRINIVIL,ZESTRIL) 10 MG tablet Take 10 mg by mouth daily.     .Marland KitchenNOVOFINE 32G X 6 MM MISC USE ONE AS DIRECTED TWICE DAILY.    .Marland KitchenNOVOTWIST 32G X 5 MM MISC USE ONE AS DIRECTED TWICE DAILY.    . pantoprazole (PROTONIX) 40 MG tablet Take 1 tablet (40 mg total) by mouth 2 (two) times daily before a meal. 60 tablet 1  . propranolol (INDERAL) 10 MG tablet TAKE THREE (3) TABLETS BY MOUTH EVERY MORNING AND 3 TABLETS EVERY EVENING. 180 tablet 5  . TRESIBA FLEXTOUCH 200 UNIT/ML SOPN Inject 52 Units into the skin at bedtime. Uses sliding scale once a day  12  . ULTICARE PEN NEEDLES 31G X 5 MM MISC USE ONE AS DIRECTED TWICE DAILY.    .Marland KitchenVICTOZA 18 MG/3ML SOLN Inject 1.8 mg into the skin daily.      No current facility-administered medications for this visit.    Allergies as of 04/02/2020 - Review Complete 04/02/2020  Allergen Reaction Noted  . Codeine Nausea And Vomiting 04/19/2012   Past Medical History:  Diagnosis Date  . B12 deficiency 11/03/2016  . B12 deficiency 11/03/2016  . Barrett's esophagus   . Cirrhosis of liver without mention of alcohol    hep B surface antigen and HCV ab negative.pt has not had hepatitis A and B vaccines.U/S on 07/04/13 shows cirrhosis.  . Diverticula of colon  pancolonic  . Esophageal reflux   . Esophageal varices ([place])   . Hiatal hernia   . Iron deficiency anemia, unspecified   . Other and unspecified hyperlipidemia   . Portal hypertensive gastropathy ([place])   . Tubular adenoma   . Type II or unspecified type diabetes mellitus without mention of complication, not stated as uncontrolled   . Unspecified essential hypertension   . Unspecified hemorrhoids without mention of complication    Past Surgical History:  Procedure Laterality Date  . ABDOMINAL HYSTERECTOMY    . BIOPSY  04/30/2016   Procedure: BIOPSY;  Surgeon: Daneil Dolin, MD;   [facility];  Service: Endoscopy;;  esophagus  . BIOPSY  02/24/2019   Procedure: BIOPSY;  Surgeon: Daneil Dolin, MD;  [facility]: [facility];  Service: Endoscopy;;  . CESAREAN SECTION     x 2  . COLONOSCOPY  03/2006   left sided diverticula, splenic flexure tublar adenoma  . COLONOSCOPY  07/2002   villous tubular adenoma in rectum  . COLONOSCOPY  09/23/09   external hemorrhoids/scattered pan colonic diverticula otherwise normal. cecal lipoma bx negative, normal TI. Next TCS 09/2014  . COLONOSCOPY N/A 11/12/2014   Procedure: COLONOSCOPY;  Surgeon: Daneil Dolin, MD;  [facility];  Service: Endoscopy;  Laterality: N/A;  1215 - moved to 12:30 - Ginger to notify pt  . COLONOSCOPY N/A 01/07/2017   Rourk: Grade 4 internal hemorrhoids, 5 mm ulcer in the sigmoid colon (benign bx), 7 mm polyp removed from the cecum (tubular adenoma), diverticulosis.next tcs in five years  . ESOPHAGEAL BANDING N/A 12/20/2016   Procedure: ESOPHAGEAL BANDING;  Surgeon: Danie Binder, MD;  [facility];  Service: Endoscopy;  Laterality: N/A;  . ESOPHAGEAL BANDING N/A 03/18/2017   Procedure: ESOPHAGEAL BANDING;  Surgeon: Daneil Dolin, MD;  [facility];  Service: Endoscopy;  Laterality: N/A;  . ESOPHAGEAL BANDING  12/17/2017   Procedure: ESOPHAGEAL BANDING;  Surgeon: Daneil Dolin, MD;  [facility];  Service: Endoscopy;;  . ESOPHAGOGASTRODUODENOSCOPY  08/2006   barretts esophagus, no dyplasia  . ESOPHAGOGASTRODUODENOSCOPY  03/2006   barretts esophagus,bx focal atypia c/w low grade dysplasia, SB bx negative for celiac  . ESOPHAGOGASTRODUODENOSCOPY  09/23/09   3 columns of grade 2 esophageal varices/hiatal hernia/3-4 cm segment Barrett's esophagus without dysplasia. Next EGD 09/2012  . ESOPHAGOGASTRODUODENOSCOPY N/A 11/11/2012   Dr. Gala Romney- Barrett;s esophagus, esophageal varices, portal gastopathy. hiatal hernia  . ESOPHAGOGASTRODUODENOSCOPY N/A 04/30/2016    Procedure: ESOPHAGOGASTRODUODENOSCOPY (EGD);  Surgeon: Daneil Dolin, MD;  [facility];  Service: Endoscopy;  Laterality: N/A;  815  . ESOPHAGOGASTRODUODENOSCOPY N/A 12/20/2016   Procedure: ESOPHAGOGASTRODUODENOSCOPY (EGD);  Surgeon: Danie Binder, MD;  [facility];  Service: Endoscopy;  Laterality: N/A;  . ESOPHAGOGASTRODUODENOSCOPY N/A 01/07/2017   Procedure: ESOPHAGOGASTRODUODENOSCOPY (EGD);  Surgeon: Daneil Dolin, MD;  [facility];  Service: Endoscopy;  Laterality: N/A;  . ESOPHAGOGASTRODUODENOSCOPY N/A 03/18/2017   Procedure: ESOPHAGOGASTRODUODENOSCOPY (EGD);  Surgeon: Daneil Dolin, MD;  [facility];  Service: Endoscopy;  Laterality: N/A;  1030   . ESOPHAGOGASTRODUODENOSCOPY N/A 12/17/2017   Procedure: ESOPHAGOGASTRODUODENOSCOPY (EGD);  Surgeon: Daneil Dolin, MD;  [facility];  Service: Endoscopy;  Laterality: N/A;  10:30am  . ESOPHAGOGASTRODUODENOSCOPY N/A 02/24/2019   Rourk: No esophageal varices seen, scars noted from prior banding.  5 cm segment of salmon-colored epithelium coming up from the GE junction consistent with Barrett's (  bx with no dysplasia).  Portal gastropathy.  Small hiatal hernia.  Recommended repeat EGD in 18 months.  Marland Kitchen [place]  2008  . small bowel capsule endoscopy  10/2009   normal   Family History  Problem Relation Age of Onset  . Alzheimer's disease Mother   . CAD Father   . Diabetes Mellitus II Father   . Alzheimer's disease Father   . Diabetes Mellitus II Sister   . Colon cancer Sister 46       small, surgical excision; chemo/rad not needed  . Cancer - Other Brother   . Diabetes Mellitus II Brother   . Hypertension Brother   . Liver disease Neg Hx    Social History   Tobacco Use  . Smoking status: Never Smoker  . Smokeless tobacco: Never Used  Vaping Use  . Vaping Use: Never used  Substance Use Topics  . Alcohol use: No  . Drug use: No    ROS:  General: Negative for  anorexia, weight loss, fever, chills, fatigue, weakness. ENT: Negative for hoarseness, difficulty swallowing , nasal congestion. CV: Negative for chest pain, angina, palpitations, dyspnea on exertion, peripheral edema.  Respiratory: Negative for dyspnea at rest, dyspnea on exertion, cough, sputum, wheezing.  GI: See history of present illness. GU:  Negative for dysuria, hematuria, urinary incontinence, urinary frequency, nocturnal urination.  Endo: Negative for unusual weight change.    Physical Examination:   BP 136/74   Pulse 75   Temp (!) 97.5 F (36.4 C) (Oral)   Ht 5' 1"  (1.549 m)   Wt 196 lb 3.2 oz (89 kg)   BMI 37.07 kg/m   General: Well-nourished, well-developed in no acute distress.  Eyes: No icterus. Mouth: masked Lungs: Clear to auscultation bilaterally.  Heart: Regular rate and rhythm, no murmurs rubs or gallops.  Abdomen: Bowel sounds are normal, nontender, nondistended, no hepatosplenomegaly or masses, no abdominal bruits or hernia , no rebound or guarding.   Extremities: No lower extremity edema. No clubbing or deformities. Neuro: Alert and oriented x 4   Skin: Warm and dry, no jaundice.   Psych: Alert and cooperative, normal mood and affect.  Labs:  Labs from May 2021: White blood cell count 3300, hemoglobin 10.9, hematocrit 32.4, platelets 45,000, AFP 162.7, sodium 136, potassium 4.5, creatinine 1.6, BUN 14, glucose 399, AST 29, ALT 17, total bilirubin 2.5, albumin 3.4, INR 1.4.  Labs from February 2021: A1c 8.0  Imaging Studies: No results found.   Impression/plan:  Pleasant 69 year old female with history of [name] cirrhosis, elevated AFP,LI-RADS 3 liver lesion, stable pancreatic lesions possible IPMN's presenting for follow-up.  [name] cirrhosis: Continues to follow with liver transplant center, initially seen there November 2018 but given the load meld score and poorly controlled DM she was not listed. In 01/2020 MELD Na 19, driven by her Creatinine and  total bili. A1C 8. Continue to advise tight glycemic control and daily exercise. Return ov here 09/2020.  Variceal screening: no varices on EGD 02/2019. Previously banding in 2019. MRI 12/2019 with large gastroesophageal varices noted. Update EGD in near future. ASA II.  I have discussed the risks, alternatives, benefits with regards to but not limited to the risk of reaction to medication, bleeding, infection, perforation and the patient is agreeable to proceed. Written consent to be obtained. Continue propanolol 14m TID.   Elevated AFP/LI-RADS 3 liver lesion: recommend her to continue to follow with [facility] for this. She has follow up in 05/2020 and plans for repeat  MRI 07/2020.   Barrett's esophagus: plan for surveillance at time of upcoming EGD. Continue pantoprazole at lowest effective dose of once or twice daily.

## 2020-04-02 NOTE — Telephone Encounter (Signed)
Noted. Will call pt when September schedule is available.

## 2020-04-02 NOTE — Telephone Encounter (Signed)
Patient seen in office today. She is supposed to have EGD with possible esophageal variceal banding by the end of the year. Can we get her on schedule for end of September? She is conscious sedation. ASA II for Rourk. Dx: barrett's esophageal and cirrhosis screen for varices.

## 2020-04-04 ENCOUNTER — Ambulatory Visit: Payer: Medicare PPO | Admitting: Nurse Practitioner

## 2020-04-12 DIAGNOSIS — E1165 Type 2 diabetes mellitus with hyperglycemia: Secondary | ICD-10-CM | POA: Diagnosis not present

## 2020-04-18 ENCOUNTER — Telehealth: Payer: Self-pay | Admitting: *Deleted

## 2020-04-18 NOTE — Telephone Encounter (Signed)
Patient needs EGD +/- esophageal variceal banding with Dr. Gala Romney, conscious sedation.  Called pt, LMOVM

## 2020-04-19 NOTE — Telephone Encounter (Signed)
VM from patient returning call. I called pt back and LMOVM

## 2020-04-22 NOTE — Telephone Encounter (Signed)
LMOVM

## 2020-04-23 NOTE — Telephone Encounter (Signed)
Letter mailed

## 2020-04-30 DIAGNOSIS — Z79899 Other long term (current) drug therapy: Secondary | ICD-10-CM | POA: Diagnosis not present

## 2020-04-30 DIAGNOSIS — R772 Abnormality of alphafetoprotein: Secondary | ICD-10-CM | POA: Diagnosis not present

## 2020-04-30 DIAGNOSIS — K7469 Other cirrhosis of liver: Secondary | ICD-10-CM | POA: Diagnosis not present

## 2020-04-30 DIAGNOSIS — I8500 Esophageal varices without bleeding: Secondary | ICD-10-CM | POA: Diagnosis not present

## 2020-04-30 DIAGNOSIS — K7581 Nonalcoholic steatohepatitis (NASH): Secondary | ICD-10-CM | POA: Diagnosis not present

## 2020-04-30 DIAGNOSIS — N289 Disorder of kidney and ureter, unspecified: Secondary | ICD-10-CM | POA: Diagnosis not present

## 2020-05-06 ENCOUNTER — Inpatient Hospital Stay (HOSPITAL_COMMUNITY): Payer: Medicare PPO | Attending: Hematology

## 2020-05-06 ENCOUNTER — Other Ambulatory Visit: Payer: Self-pay

## 2020-05-06 DIAGNOSIS — R944 Abnormal results of kidney function studies: Secondary | ICD-10-CM | POA: Diagnosis not present

## 2020-05-06 DIAGNOSIS — Z794 Long term (current) use of insulin: Secondary | ICD-10-CM | POA: Diagnosis not present

## 2020-05-06 DIAGNOSIS — I129 Hypertensive chronic kidney disease with stage 1 through stage 4 chronic kidney disease, or unspecified chronic kidney disease: Secondary | ICD-10-CM | POA: Insufficient documentation

## 2020-05-06 DIAGNOSIS — N189 Chronic kidney disease, unspecified: Secondary | ICD-10-CM | POA: Diagnosis not present

## 2020-05-06 DIAGNOSIS — K7460 Unspecified cirrhosis of liver: Secondary | ICD-10-CM | POA: Insufficient documentation

## 2020-05-06 DIAGNOSIS — Z79899 Other long term (current) drug therapy: Secondary | ICD-10-CM | POA: Insufficient documentation

## 2020-05-06 DIAGNOSIS — D696 Thrombocytopenia, unspecified: Secondary | ICD-10-CM | POA: Insufficient documentation

## 2020-05-06 DIAGNOSIS — E538 Deficiency of other specified B group vitamins: Secondary | ICD-10-CM | POA: Diagnosis not present

## 2020-05-06 DIAGNOSIS — K219 Gastro-esophageal reflux disease without esophagitis: Secondary | ICD-10-CM | POA: Diagnosis not present

## 2020-05-06 DIAGNOSIS — E785 Hyperlipidemia, unspecified: Secondary | ICD-10-CM | POA: Diagnosis not present

## 2020-05-06 DIAGNOSIS — E1122 Type 2 diabetes mellitus with diabetic chronic kidney disease: Secondary | ICD-10-CM | POA: Diagnosis not present

## 2020-05-06 DIAGNOSIS — D731 Hypersplenism: Secondary | ICD-10-CM | POA: Diagnosis not present

## 2020-05-06 DIAGNOSIS — D500 Iron deficiency anemia secondary to blood loss (chronic): Secondary | ICD-10-CM

## 2020-05-06 DIAGNOSIS — D509 Iron deficiency anemia, unspecified: Secondary | ICD-10-CM | POA: Diagnosis not present

## 2020-05-06 LAB — COMPREHENSIVE METABOLIC PANEL
ALT: 16 U/L (ref 0–44)
AST: 28 U/L (ref 15–41)
Albumin: 3.6 g/dL (ref 3.5–5.0)
Alkaline Phosphatase: 76 U/L (ref 38–126)
Anion gap: 6 (ref 5–15)
BUN: 15 mg/dL (ref 8–23)
CO2: 24 mmol/L (ref 22–32)
Calcium: 8.7 mg/dL — ABNORMAL LOW (ref 8.9–10.3)
Chloride: 108 mmol/L (ref 98–111)
Creatinine, Ser: 1.6 mg/dL — ABNORMAL HIGH (ref 0.44–1.00)
GFR calc Af Amer: 38 mL/min — ABNORMAL LOW (ref >60)
GFR calc non Af Amer: 33 mL/min — ABNORMAL LOW (ref >60)
Glucose, Bld: 181 mg/dL — ABNORMAL HIGH (ref 70–99)
Potassium: 4.3 mmol/L (ref 3.5–5.1)
Sodium: 138 mmol/L (ref 135–145)
Total Bilirubin: 3.2 mg/dL — ABNORMAL HIGH (ref 0.3–1.2)
Total Protein: 6.6 g/dL (ref 6.5–8.1)

## 2020-05-06 LAB — CBC WITH DIFFERENTIAL/PLATELET
Abs Immature Granulocytes: 0.02 10*3/uL (ref 0.00–0.07)
Basophils Absolute: 0 10*3/uL (ref 0.0–0.1)
Basophils Relative: 1 %
Eosinophils Absolute: 0.1 10*3/uL (ref 0.0–0.5)
Eosinophils Relative: 3 %
HCT: 34.5 % — ABNORMAL LOW (ref 36.0–46.0)
Hemoglobin: 11.5 g/dL — ABNORMAL LOW (ref 12.0–15.0)
Immature Granulocytes: 1 %
Lymphocytes Relative: 25 %
Lymphs Abs: 1.1 10*3/uL (ref 0.7–4.0)
MCH: 30.1 pg (ref 26.0–34.0)
MCHC: 33.3 g/dL (ref 30.0–36.0)
MCV: 90.3 fL (ref 80.0–100.0)
Monocytes Absolute: 0.4 10*3/uL (ref 0.1–1.0)
Monocytes Relative: 9 %
Neutro Abs: 2.7 10*3/uL (ref 1.7–7.7)
Neutrophils Relative %: 61 %
Platelets: 55 10*3/uL — ABNORMAL LOW (ref 150–400)
RBC: 3.82 MIL/uL — ABNORMAL LOW (ref 3.87–5.11)
RDW: 14.9 % (ref 11.5–15.5)
WBC: 4.4 10*3/uL (ref 4.0–10.5)
nRBC: 0 % (ref 0.0–0.2)

## 2020-05-06 LAB — IRON AND TIBC
Iron: 106 ug/dL (ref 28–170)
Saturation Ratios: 33 % — ABNORMAL HIGH (ref 10.4–31.8)
TIBC: 318 ug/dL (ref 250–450)
UIBC: 212 ug/dL

## 2020-05-06 LAB — FERRITIN: Ferritin: 92 ng/mL (ref 11–307)

## 2020-05-06 LAB — VITAMIN B12: Vitamin B-12: 1775 pg/mL — ABNORMAL HIGH (ref 180–914)

## 2020-05-06 LAB — LACTATE DEHYDROGENASE: LDH: 168 U/L (ref 98–192)

## 2020-05-06 LAB — VITAMIN D 25 HYDROXY (VIT D DEFICIENCY, FRACTURES): Vit D, 25-Hydroxy: 42.24 ng/mL (ref 30–100)

## 2020-05-06 LAB — FOLATE: Folate: 11.3 ng/mL (ref >5.9)

## 2020-05-07 ENCOUNTER — Inpatient Hospital Stay (HOSPITAL_COMMUNITY): Payer: Medicare PPO | Admitting: Nurse Practitioner

## 2020-05-07 ENCOUNTER — Other Ambulatory Visit: Payer: Self-pay

## 2020-05-07 DIAGNOSIS — I8500 Esophageal varices without bleeding: Secondary | ICD-10-CM | POA: Diagnosis not present

## 2020-05-07 DIAGNOSIS — E785 Hyperlipidemia, unspecified: Secondary | ICD-10-CM | POA: Diagnosis not present

## 2020-05-07 DIAGNOSIS — R944 Abnormal results of kidney function studies: Secondary | ICD-10-CM | POA: Diagnosis not present

## 2020-05-07 DIAGNOSIS — I10 Essential (primary) hypertension: Secondary | ICD-10-CM | POA: Diagnosis not present

## 2020-05-07 DIAGNOSIS — Z299 Encounter for prophylactic measures, unspecified: Secondary | ICD-10-CM | POA: Diagnosis not present

## 2020-05-07 DIAGNOSIS — D509 Iron deficiency anemia, unspecified: Secondary | ICD-10-CM | POA: Diagnosis not present

## 2020-05-07 DIAGNOSIS — E538 Deficiency of other specified B group vitamins: Secondary | ICD-10-CM | POA: Diagnosis not present

## 2020-05-07 DIAGNOSIS — E1165 Type 2 diabetes mellitus with hyperglycemia: Secondary | ICD-10-CM | POA: Diagnosis not present

## 2020-05-07 DIAGNOSIS — D500 Iron deficiency anemia secondary to blood loss (chronic): Secondary | ICD-10-CM | POA: Diagnosis not present

## 2020-05-07 DIAGNOSIS — D696 Thrombocytopenia, unspecified: Secondary | ICD-10-CM | POA: Diagnosis not present

## 2020-05-07 DIAGNOSIS — K219 Gastro-esophageal reflux disease without esophagitis: Secondary | ICD-10-CM | POA: Diagnosis not present

## 2020-05-07 DIAGNOSIS — I129 Hypertensive chronic kidney disease with stage 1 through stage 4 chronic kidney disease, or unspecified chronic kidney disease: Secondary | ICD-10-CM | POA: Diagnosis not present

## 2020-05-07 DIAGNOSIS — K7460 Unspecified cirrhosis of liver: Secondary | ICD-10-CM | POA: Diagnosis not present

## 2020-05-07 DIAGNOSIS — N189 Chronic kidney disease, unspecified: Secondary | ICD-10-CM | POA: Diagnosis not present

## 2020-05-07 LAB — HEMOGLOBIN A1C: Hemoglobin A1C: 8.1

## 2020-05-07 NOTE — Progress Notes (Signed)
[place], [place] [address]   CLINIC:  Medical Oncology/Hematology  PCP:  Glenda Chroman, MD [place] [address] [phone]   REASON FOR VISIT: Follow-up for iron deficiency anemia and thrombocytopenia   CURRENT THERAPY: Intermittent iron infusions    INTERVAL HISTORY:  Alisha [name] 69 y.o. 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  Patient reports she is doing well since her last visit.  She does report her energy levels have decreased significantly.  She denies any bright red bleeding per rectum or melena.  She denies any easy bruising or bleeding. Denies any nausea, vomiting, or diarrhea. Denies any new pains. Had not noticed any recent bleeding such as epistaxis, hematuria or hematochezia. Denies recent chest pain on exertion, shortness of breath on minimal exertion, pre-syncopal episodes, or palpitations. Denies any numbness or tingling in hands or feet. Denies any recent fevers, infections, or recent hospitalizations. Patient reports appetite at 50% and energy level at 75%.  She is eating well and maintaining her weight at this time.     REVIEW OF SYSTEMS:  Review of Systems  Constitutional: Positive for fatigue.  Gastrointestinal: Positive for vomiting (Occasionally).  All other systems reviewed and are negative.    PAST MEDICAL/SURGICAL HISTORY:  Past Medical History:  Diagnosis Date  . B12 deficiency 11/03/2016  . B12 deficiency 11/03/2016  . Barrett's esophagus   . Cirrhosis of liver without mention of alcohol    hep B surface antigen and HCV ab negative.pt has not had hepatitis A and B vaccines.U/S on 07/04/13 shows cirrhosis.  . Diverticula of colon    pancolonic  . Esophageal reflux   . Esophageal varices ([place])   . Hiatal hernia   . Iron deficiency anemia, unspecified   . Other and unspecified hyperlipidemia   . Portal hypertensive  gastropathy ([place])   . Tubular adenoma   . Type II or unspecified type diabetes mellitus without mention of complication, not stated as uncontrolled   . Unspecified essential hypertension   . Unspecified hemorrhoids without mention of complication    Past Surgical History:  Procedure Laterality Date  . ABDOMINAL HYSTERECTOMY    . BIOPSY  04/30/2016   Procedure: BIOPSY;  Surgeon: Daneil Dolin, MD;  [facility];  Service: Endoscopy;;  esophagus  . BIOPSY  02/24/2019   Procedure: BIOPSY;  Surgeon: Daneil Dolin, MD;  [facility];  Service: Endoscopy;;  . CESAREAN SECTION     x 2  . COLONOSCOPY  03/2006   left sided diverticula, splenic flexure tublar adenoma  . COLONOSCOPY  07/2002   villous tubular adenoma in rectum  . COLONOSCOPY  09/23/09   external hemorrhoids/scattered pan colonic diverticula otherwise normal. cecal lipoma bx negative, normal TI. Next TCS 09/2014  . COLONOSCOPY N/A 11/12/2014   Procedure: COLONOSCOPY;  Surgeon: Daneil Dolin, MD;  [facility];  Service: Endoscopy;  Laterality: N/A;  1215 - moved to 12:30 - Ginger to notify pt  . COLONOSCOPY N/A 01/07/2017   Rourk: Grade 4 internal hemorrhoids, 5 mm ulcer in the sigmoid colon (benign bx), 7 mm polyp removed from the cecum (tubular adenoma), diverticulosis.next tcs in five years  . ESOPHAGEAL BANDING N/A 12/20/2016   Procedure: ESOPHAGEAL BANDING;  Surgeon: Danie Binder, MD;  [facility];  Service: Endoscopy;  Laterality: N/A;  . ESOPHAGEAL BANDING N/A 03/18/2017   Procedure:  ESOPHAGEAL BANDING;  Surgeon: Daneil Dolin, MD;  [facility];  Service: Endoscopy;  Laterality: N/A;  . ESOPHAGEAL BANDING  12/17/2017   Procedure: ESOPHAGEAL BANDING;  Surgeon: Daneil Dolin, MD;  [facility];  Service: Endoscopy;;  . ESOPHAGOGASTRODUODENOSCOPY  08/2006   barretts esophagus, no dyplasia  . ESOPHAGOGASTRODUODENOSCOPY  03/2006   barretts esophagus,bx focal  atypia c/w low grade dysplasia, SB bx negative for celiac  . ESOPHAGOGASTRODUODENOSCOPY  09/23/09   3 columns of grade 2 esophageal varices/hiatal hernia/3-4 cm segment Barrett's esophagus without dysplasia. Next EGD 09/2012  . ESOPHAGOGASTRODUODENOSCOPY N/A 11/11/2012   Dr. Gala Romney- Barrett;s esophagus, esophageal varices, portal gastopathy. hiatal hernia  . ESOPHAGOGASTRODUODENOSCOPY N/A 04/30/2016   Procedure: ESOPHAGOGASTRODUODENOSCOPY (EGD);  Surgeon: Daneil Dolin, MD;  [facility];  Service: Endoscopy;  Laterality: N/A;  815  . ESOPHAGOGASTRODUODENOSCOPY N/A 12/20/2016   Procedure: ESOPHAGOGASTRODUODENOSCOPY (EGD);  Surgeon: Danie Binder, MD;  [facility];  Service: Endoscopy;  Laterality: N/A;  . ESOPHAGOGASTRODUODENOSCOPY N/A 01/07/2017   Procedure: ESOPHAGOGASTRODUODENOSCOPY (EGD);  Surgeon: Daneil Dolin, MD;  [facility];  Service: Endoscopy;  Laterality: N/A;  . ESOPHAGOGASTRODUODENOSCOPY N/A 03/18/2017   Procedure: ESOPHAGOGASTRODUODENOSCOPY (EGD);  Surgeon: Daneil Dolin, MD;  [facility];  Service: Endoscopy;  Laterality: N/A;  1030   . ESOPHAGOGASTRODUODENOSCOPY N/A 12/17/2017   Procedure: ESOPHAGOGASTRODUODENOSCOPY (EGD);  Surgeon: Daneil Dolin, MD;  [facility];  Service: Endoscopy;  Laterality: N/A;  10:30am  . ESOPHAGOGASTRODUODENOSCOPY N/A 02/24/2019   Rourk: No esophageal varices seen, scars noted from prior banding.  5 cm segment of salmon-colored epithelium coming up from the GE junction consistent with Barrett's (bx with no dysplasia).  Portal gastropathy.  Small hiatal hernia.  Recommended repeat EGD in 18 months.  Marland Kitchen [place]  2008  . small bowel capsule endoscopy  10/2009   normal     SOCIAL HISTORY:  Social History   Socioeconomic History  . Marital status: Married    Spouse name: Not on file  . Number of children: Not on file  . Years of education: Not on file  . Highest education level: Not  on file  Occupational History  . Not on file  Tobacco Use  . Smoking status: Never Smoker  . Smokeless tobacco: Never Used  Vaping Use  . Vaping Use: Never used  Substance and Sexual Activity  . Alcohol use: No  . Drug use: No  . Sexual activity: Yes  Other Topics Concern  . Not on file  Social History Narrative  . Not on file   Social Determinants of Health   Financial Resource Strain:   . Difficulty of Paying Living Expenses: Not on file  Food Insecurity:   . Worried About Charity fundraiser in the Last Year: Not on file  . Ran Out of Food in the Last Year: Not on file  Transportation Needs:   . Lack of Transportation (Medical): Not on file  . Lack of Transportation (Non-Medical): Not on file  Physical Activity:   . Days of Exercise per Week: Not on file  . Minutes of Exercise per Session: Not on file  Stress:   . Feeling of Stress : Not on file  Social Connections:   . Frequency of Communication with Friends and Family: Not on file  . Frequency of Social Gatherings with Friends and Family: Not on file  . Attends Religious Services: Not on file  . Active  Member of Clubs or Organizations: Not on file  . Attends Archivist Meetings: Not on file  . Marital Status: Not on file  Intimate Partner Violence:   . Fear of Current or Ex-Partner: Not on file  . Emotionally Abused: Not on file  . Physically Abused: Not on file  . Sexually Abused: Not on file    FAMILY HISTORY:  Family History  Problem Relation Age of Onset  . Alzheimer's disease Mother   . CAD Father   . Diabetes Mellitus II Father   . Alzheimer's disease Father   . Diabetes Mellitus II Sister   . Colon cancer Sister 66       small, surgical excision; chemo/rad not needed  . Cancer - Other Brother   . Diabetes Mellitus II Brother   . Hypertension Brother   . Liver disease Neg Hx     CURRENT MEDICATIONS:  Outpatient Encounter Medications as of 05/07/2020  Medication Sig  . ACCU-CHEK  SOFTCLIX LANCETS lancets USE TO CHECK BLOOD SUGAR TWICE DAILY.  Marland Kitchen Cholecalciferol (VITAMIN D) 2000 units tablet Take 2,000 Units by mouth daily.  . cyanocobalamin 2000 MCG tablet Take 2,000 mcg by mouth daily.  Marland Kitchen GLIPIZIDE XL 10 MG 24 hr tablet Take 10 mg by mouth daily.   . insulin aspart (NOVOLOG FLEXPEN) 100 UNIT/ML FlexPen INJECT SIX UNITS BEFORE LUNCH AND SUPPER AND SLIDING SCALE, MAX DAILY DOSE30 UNITS  . lisinopril (PRINIVIL,ZESTRIL) 10 MG tablet Take 10 mg by mouth daily.   Marland Kitchen NOVOFINE 32G X 6 MM MISC USE ONE AS DIRECTED TWICE DAILY.  Marland Kitchen NOVOTWIST 32G X 5 MM MISC USE ONE AS DIRECTED TWICE DAILY.  . pantoprazole (PROTONIX) 40 MG tablet Take 1 tablet (40 mg total) by mouth 2 (two) times daily before a meal.  . propranolol (INDERAL) 10 MG tablet TAKE THREE (3) TABLETS BY MOUTH EVERY MORNING AND 3 TABLETS EVERY EVENING.  . TRESIBA FLEXTOUCH 200 UNIT/ML SOPN Inject 52 Units into the skin at bedtime. Uses sliding scale once a day  . ULTICARE PEN NEEDLES 31G X 5 MM MISC USE ONE AS DIRECTED TWICE DAILY.  Marland Kitchen VICTOZA 18 MG/3ML SOLN Inject 1.8 mg into the skin daily.    No facility-administered encounter medications on file as of 05/07/2020.    ALLERGIES:  Allergies  Allergen Reactions  . Codeine Nausea And Vomiting     PHYSICAL EXAM:  ECOG Performance status: 1  Vitals:   05/07/20 1030  BP: 138/62  Pulse: 71  Resp: 17  Temp: (!) 97.1 F (36.2 C)  SpO2: 98%   Filed Weights   05/07/20 1030  Weight: 194 lb (88 kg)   Physical Exam Constitutional:      Appearance: Normal appearance. She is normal weight.  Musculoskeletal:        General: Normal range of motion.  Skin:    General: Skin is warm.  Neurological:     Mental Status: She is alert and oriented to person, place, and time. Mental status is at baseline.  Psychiatric:        Mood and Affect: Mood normal.        Behavior: Behavior normal.        Thought Content: Thought content normal.        Judgment: Judgment normal.       LABORATORY DATA:  I have reviewed the labs as listed.  CBC    Component Value Date/Time   WBC 4.4 05/06/2020 1107   RBC 3.82 (L) 05/06/2020  1107   HGB 11.5 (L) 05/06/2020 1107   HCT 34.5 (L) 05/06/2020 1107   PLT 55 (L) 05/06/2020 1107   MCV 90.3 05/06/2020 1107   MCH 30.1 05/06/2020 1107   MCHC 33.3 05/06/2020 1107   RDW 14.9 05/06/2020 1107   LYMPHSABS 1.1 05/06/2020 1107   MONOABS 0.4 05/06/2020 1107   EOSABS 0.1 05/06/2020 1107   BASOSABS 0.0 05/06/2020 1107   CMP Latest Ref Rng & Units 05/06/2020 01/25/2020 01/25/2020  Glucose 70 - 99 mg/dL 181(H) 166(H) -  BUN 8 - 23 mg/dL 15 18 -  Creatinine 0.44 - 1.00 mg/dL 1.60(H) 1.64(H) -  Sodium 135 - 145 mmol/L 138 137 -  Potassium 3.5 - 5.1 mmol/L 4.3 4.1 -  Chloride 98 - 111 mmol/L 108 105 -  CO2 22 - 32 mmol/L 24 25 -  Calcium 8.9 - 10.3 mg/dL 8.7(L) 8.9 9.1  Total Protein 6.5 - 8.1 g/dL 6.6 - -  Total Bilirubin 0.3 - 1.2 mg/dL 3.2(H) - -  Alkaline Phos 38 - 126 U/L 76 - -  AST 15 - 41 U/L 28 - -  ALT 0 - 44 U/L 16 - -    All questions were answered to patient's stated satisfaction. Encouraged patient to call with any new concerns or questions before his next visit to the cancer center and we can certain see him sooner, if needed.     ASSESSMENT & PLAN:  Iron deficiency anemia 1.  Normocytic anemia: -This is from a combination of CKD, chronic blood loss, and iron deficiency. -Last Feraheme was on 10/05/2018. - She had labs on 05/06/2020 which showed hemoglobin 11.5, ferritin 92, percent saturation 33 -Patient reports her fatigue levels have increased greatly. -We will set her up with 2 infusions of IV iron. - She will follow-up in 4 months with repeat labs.  2.  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 - Platelet count is more or less stable and stays around 50. - Labs on 05/06/2020 showed platelet count 55  3.  Elevated creatinine: - Labs drawn on 05/06/2020 showed  creatinine of 1.60 - This is stable at this time we will continue to monitor.  4.  Cirrhosis: - She follows up with the liver transplant program at [facility]. -Her last MRI of the abdomen without contrast on 10/04/2018 showed she had cirrhosis and portal venous hypertension, without suspicious liver lesion.  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 -She reports she was at [facility] last week.  She received an MRI soon.  5.  B12 deficiency: - Patient takes B12 injections monthly. -Patient wanted to discontinue monthly injections she has now taking B12 1 mg daily orally. -Labs done on 05/06/2020 showed B12 level 1775 -She will continue taking the oral form as it is working for her.     Orders placed this encounter:  Orders Placed This Encounter  Procedures  . CBC with Differential/Platelet  . Comprehensive metabolic panel  . Ferritin  . Iron and TIBC  . Lactate dehydrogenase  . Vitamin B12  . VITAMIN D 25 Hydroxy (Vit-D Deficiency, Fractures)  . Folate      Francene Finders, FNP-C [place] [phone]

## 2020-05-07 NOTE — Assessment & Plan Note (Signed)
1.  Normocytic anemia: -This is from a combination of CKD, chronic blood loss, and iron deficiency. -Last Feraheme was on 10/05/2018. - She had labs on 05/06/2020 which showed hemoglobin 11.5, ferritin 92, percent saturation 33 -Patient reports her fatigue levels have increased greatly. -We will set her up with 2 infusions of IV iron. - She will follow-up in 4 months with repeat labs.  2.  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 - Platelet count is more or less stable and stays around 50. - Labs on 05/06/2020 showed platelet count 55  3.  Elevated creatinine: - Labs drawn on 05/06/2020 showed creatinine of 1.60 - This is stable at this time we will continue to monitor.  4.  Cirrhosis: - She follows up with the liver transplant program at [facility]. -Her last MRI of the abdomen without contrast on 10/04/2018 showed she had cirrhosis and portal venous hypertension, without suspicious liver lesion.  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 -She reports she was at [facility] last week.  She received an MRI soon.  5.  B12 deficiency: - Patient takes B12 injections monthly. -Patient wanted to discontinue monthly injections she has now taking B12 1 mg daily orally. -Labs done on 05/06/2020 showed B12 level 1775 -She will continue taking the oral form as it is working for her.

## 2020-05-13 ENCOUNTER — Telehealth: Payer: Self-pay

## 2020-05-13 ENCOUNTER — Other Ambulatory Visit: Payer: Self-pay

## 2020-05-13 NOTE — Telephone Encounter (Signed)
EGD approved. [employer]# [id number], valid 07/03/20-08/02/20.

## 2020-05-13 NOTE — Telephone Encounter (Signed)
Pt called office 05/10/20 and LMOVM to schedule procedure.  Called pt this morning, EGD/-/+esoph varices banding w/Dr. Gala Romney scheduled for 07/03/20 at 10:15am. COVID test 07/02/20 at 10:00am. Orders entered. Appt letter and procedure instructions mailed.  PA for EGD submitted via [employer] website. Tried to add CPT for varices banding but code not found. Case went to review. Rona Ravens ID: [id number]. Task: [id number]. [phone].

## 2020-05-14 DIAGNOSIS — E1165 Type 2 diabetes mellitus with hyperglycemia: Secondary | ICD-10-CM | POA: Diagnosis not present

## 2020-05-15 ENCOUNTER — Encounter (HOSPITAL_COMMUNITY): Payer: Self-pay

## 2020-05-15 ENCOUNTER — Other Ambulatory Visit: Payer: Self-pay

## 2020-05-15 ENCOUNTER — Inpatient Hospital Stay (HOSPITAL_COMMUNITY): Payer: Medicare PPO | Attending: Hematology

## 2020-05-15 VITALS — BP 145/74 | HR 65 | Temp 97.2°F | Resp 18

## 2020-05-15 DIAGNOSIS — D509 Iron deficiency anemia, unspecified: Secondary | ICD-10-CM | POA: Diagnosis not present

## 2020-05-15 DIAGNOSIS — E538 Deficiency of other specified B group vitamins: Secondary | ICD-10-CM

## 2020-05-15 DIAGNOSIS — Z79899 Other long term (current) drug therapy: Secondary | ICD-10-CM | POA: Insufficient documentation

## 2020-05-15 MED ORDER — SODIUM CHLORIDE 0.9 % IV SOLN
510.0000 mg | Freq: Once | INTRAVENOUS | Status: AC
  Administered 2020-05-15: 510 mg via INTRAVENOUS
  Filled 2020-05-15: qty 510

## 2020-05-15 MED ORDER — SODIUM CHLORIDE 0.9 % IV SOLN: Freq: Once | INTRAVENOUS | Status: AC

## 2020-05-15 NOTE — Progress Notes (Signed)
Patient tolerated iron infusion with no complaints voiced.  Peripheral IV site clean and dry with good blood return noted before and after infusion.  Band aid applied.  VSS with discharge and left in satisfactory condition with no s/s of distress noted.

## 2020-05-21 DIAGNOSIS — K766 Portal hypertension: Secondary | ICD-10-CM | POA: Diagnosis not present

## 2020-05-21 DIAGNOSIS — K769 Liver disease, unspecified: Secondary | ICD-10-CM | POA: Diagnosis not present

## 2020-05-21 DIAGNOSIS — K7581 Nonalcoholic steatohepatitis (NASH): Secondary | ICD-10-CM | POA: Diagnosis not present

## 2020-05-21 DIAGNOSIS — K7469 Other cirrhosis of liver: Secondary | ICD-10-CM | POA: Diagnosis not present

## 2020-05-22 ENCOUNTER — Inpatient Hospital Stay (HOSPITAL_COMMUNITY): Payer: Medicare PPO

## 2020-05-22 ENCOUNTER — Encounter (HOSPITAL_COMMUNITY): Payer: Self-pay

## 2020-05-22 ENCOUNTER — Other Ambulatory Visit: Payer: Self-pay

## 2020-05-22 VITALS — BP 126/42 | HR 74 | Temp 96.8°F | Resp 18

## 2020-05-22 DIAGNOSIS — D509 Iron deficiency anemia, unspecified: Secondary | ICD-10-CM | POA: Diagnosis not present

## 2020-05-22 DIAGNOSIS — Z79899 Other long term (current) drug therapy: Secondary | ICD-10-CM | POA: Diagnosis not present

## 2020-05-22 DIAGNOSIS — E538 Deficiency of other specified B group vitamins: Secondary | ICD-10-CM

## 2020-05-22 MED ORDER — SODIUM CHLORIDE 0.9 % IV SOLN
510.0000 mg | Freq: Once | INTRAVENOUS | Status: AC
  Administered 2020-05-22: 510 mg via INTRAVENOUS
  Filled 2020-05-22: qty 510

## 2020-05-22 MED ORDER — SODIUM CHLORIDE 0.9 % IV SOLN: Freq: Once | INTRAVENOUS | Status: AC

## 2020-05-22 NOTE — Patient Instructions (Signed)
[place] [facility]  Discharge Instructions:  Please follow up with [name] as needed and scheduled. _______________________________________________________________  Thank you for choosing [place] at [facility] to provide your oncology and hematology care.  To afford each patient quality time with our providers, please arrive at least 15 minutes before your scheduled appointment.  You need to re-schedule your appointment if you arrive 10 or more minutes late.  We strive to give you quality time with our providers, and arriving late affects you and other patients whose appointments are after yours.  Also, if you no show three or more times for appointments you may be dismissed from the clinic.  Again, thank you for choosing [place] at [place] hope is that these requests will allow you access to exceptional care and in a timely manner. _______________________________________________________________  If you have questions after your visit, please contact our office at [phone] between the hours of 8:30 a.m. and 5:00 p.m. Voicemails left after 4:30 p.m. will not be returned until the following business day. _______________________________________________________________  For prescription refill requests, have your pharmacy contact our office. _______________________________________________________________  Recommendations made by the consultant and any test results will be sent to your referring physician. _______________________________________________________________  Ferumoxytol injection What is this medicine? FERUMOXYTOL is an iron complex. Iron is used to make healthy red blood cells, which carry oxygen and nutrients throughout the body. This medicine is used to treat iron deficiency anemia. This medicine may be used for other purposes; ask your health care provider or pharmacist if you have  questions. COMMON BRAND NAME(S): Feraheme What should I tell my health care provider before I take this medicine? They need to know if you have any of these conditions:  anemia not caused by low iron levels  high levels of iron in the blood  magnetic resonance imaging (MRI) test scheduled  an unusual or allergic reaction to iron, other medicines, foods, dyes, or preservatives  pregnant or trying to get pregnant  breast-feeding How should I use this medicine? This medicine is for injection into a vein. It is given by a health care professional in a hospital or clinic setting. Talk to your pediatrician regarding the use of this medicine in children. Special care may be needed. Overdosage: If you think you have taken too much of this medicine contact a poison control center or emergency room at once. NOTE: This medicine is only for you. Do not share this medicine with others. What if I miss a dose? It is important not to miss your dose. Call your doctor or health care professional if you are unable to keep an appointment. What may interact with this medicine? This medicine may interact with the following medications:  other iron products This list may not describe all possible interactions. Give your health care provider a list of all the medicines, herbs, non-prescription drugs, or dietary supplements you use. Also tell them if you smoke, drink alcohol, or use illegal drugs. Some items may interact with your medicine. What should I watch for while using this medicine? Visit your doctor or healthcare professional regularly. Tell your doctor or healthcare professional if your symptoms do not start to get better or if they get worse. You may need blood work done while you are taking this medicine. You may need to follow a special diet. Talk to your doctor. Foods that contain iron include: whole grains/cereals, dried fruits, beans, or peas,  leafy green vegetables, and organ meats (liver,  kidney). What side effects may I notice from receiving this medicine? Side effects that you should report to your doctor or health care professional as soon as possible:  allergic reactions like skin rash, itching or hives, swelling of the face, lips, or tongue  breathing problems  changes in blood pressure  feeling faint or lightheaded, falls  fever or chills  flushing, sweating, or hot feelings  swelling of the ankles or feet Side effects that usually do not require medical attention (report to your doctor or health care professional if they continue or are bothersome):  diarrhea  headache  nausea, vomiting  stomach pain This list may not describe all possible side effects. Call your doctor for medical advice about side effects. You may report side effects to FDA at 1-800-FDA-1088. Where should I keep my medicine? This drug is given in a hospital or clinic and will not be stored at home. NOTE: This sheet is a summary. It may not cover all possible information. If you have questions about this medicine, talk to your doctor, pharmacist, or health care provider.  2020 Elsevier/Gold Standard (2016-10-19 20:21:10)

## 2020-05-22 NOTE — Progress Notes (Signed)
Patient tolerated Iron infusion well without complaints or incident. Peripheral IV site checked with positive blood return noted prior to and after infusion. VSS upon discharge. Pt discharged in satisfactory condition ambulatory with husband. Pt verbalized understanding of discharge instructions and will follow up with cancer center as scheduled.

## 2020-05-29 ENCOUNTER — Other Ambulatory Visit (HOSPITAL_COMMUNITY)
Admission: RE | Admit: 2020-05-29 | Discharge: 2020-05-29 | Disposition: A | Discharge status: Home / Self Care | Payer: Medicare PPO | Source: Ambulatory Visit | Attending: Nephrology | Admitting: Nephrology

## 2020-05-29 ENCOUNTER — Other Ambulatory Visit: Payer: Self-pay

## 2020-05-29 DIAGNOSIS — D485 Neoplasm of uncertain behavior of skin: Secondary | ICD-10-CM | POA: Diagnosis not present

## 2020-05-29 DIAGNOSIS — E559 Vitamin D deficiency, unspecified: Secondary | ICD-10-CM | POA: Diagnosis not present

## 2020-05-29 DIAGNOSIS — E211 Secondary hyperparathyroidism, not elsewhere classified: Secondary | ICD-10-CM | POA: Diagnosis not present

## 2020-05-29 DIAGNOSIS — D631 Anemia in chronic kidney disease: Secondary | ICD-10-CM | POA: Diagnosis not present

## 2020-05-29 DIAGNOSIS — R809 Proteinuria, unspecified: Secondary | ICD-10-CM | POA: Diagnosis not present

## 2020-05-29 DIAGNOSIS — E1129 Type 2 diabetes mellitus with other diabetic kidney complication: Secondary | ICD-10-CM | POA: Insufficient documentation

## 2020-05-29 DIAGNOSIS — E1122 Type 2 diabetes mellitus with diabetic chronic kidney disease: Secondary | ICD-10-CM | POA: Insufficient documentation

## 2020-05-29 DIAGNOSIS — Z299 Encounter for prophylactic measures, unspecified: Secondary | ICD-10-CM | POA: Diagnosis not present

## 2020-05-29 DIAGNOSIS — I10 Essential (primary) hypertension: Secondary | ICD-10-CM | POA: Diagnosis not present

## 2020-05-29 DIAGNOSIS — N189 Chronic kidney disease, unspecified: Secondary | ICD-10-CM | POA: Diagnosis not present

## 2020-05-29 DIAGNOSIS — B078 Other viral warts: Secondary | ICD-10-CM | POA: Diagnosis not present

## 2020-05-29 DIAGNOSIS — L820 Inflamed seborrheic keratosis: Secondary | ICD-10-CM | POA: Diagnosis not present

## 2020-05-29 LAB — CBC
HCT: 35.4 % — ABNORMAL LOW (ref 36.0–46.0)
Hemoglobin: 11.8 g/dL — ABNORMAL LOW (ref 12.0–15.0)
MCH: 30 pg (ref 26.0–34.0)
MCHC: 33.3 g/dL (ref 30.0–36.0)
MCV: 90.1 fL (ref 80.0–100.0)
Platelets: 56 10*3/uL — ABNORMAL LOW (ref 150–400)
RBC: 3.93 MIL/uL (ref 3.87–5.11)
RDW: 15.3 % (ref 11.5–15.5)
WBC: 4.3 10*3/uL (ref 4.0–10.5)
nRBC: 0 % (ref 0.0–0.2)

## 2020-05-29 LAB — RENAL FUNCTION PANEL
Albumin: 3.6 g/dL (ref 3.5–5.0)
Anion gap: 6 (ref 5–15)
BUN: 21 mg/dL (ref 8–23)
CO2: 23 mmol/L (ref 22–32)
Calcium: 9 mg/dL (ref 8.9–10.3)
Chloride: 106 mmol/L (ref 98–111)
Creatinine, Ser: 1.68 mg/dL — ABNORMAL HIGH (ref 0.44–1.00)
GFR calc Af Amer: 36 mL/min — ABNORMAL LOW (ref >60)
GFR calc non Af Amer: 31 mL/min — ABNORMAL LOW (ref >60)
Glucose, Bld: 236 mg/dL — ABNORMAL HIGH (ref 70–99)
Phosphorus: 3.3 mg/dL (ref 2.5–4.6)
Potassium: 4.4 mmol/L (ref 3.5–5.1)
Sodium: 135 mmol/L (ref 135–145)

## 2020-05-29 LAB — PROTEIN / CREATININE RATIO, URINE
Creatinine, Urine: 213.47 mg/dL
Protein Creatinine Ratio: 0.04 mg/mg{Cre} (ref 0.00–0.15)
Total Protein, Urine: 9 mg/dL

## 2020-05-31 DIAGNOSIS — I10 Essential (primary) hypertension: Secondary | ICD-10-CM | POA: Diagnosis not present

## 2020-05-31 DIAGNOSIS — D631 Anemia in chronic kidney disease: Secondary | ICD-10-CM | POA: Diagnosis not present

## 2020-05-31 DIAGNOSIS — E211 Secondary hyperparathyroidism, not elsewhere classified: Secondary | ICD-10-CM | POA: Diagnosis not present

## 2020-05-31 DIAGNOSIS — E1122 Type 2 diabetes mellitus with diabetic chronic kidney disease: Secondary | ICD-10-CM | POA: Diagnosis not present

## 2020-05-31 DIAGNOSIS — N189 Chronic kidney disease, unspecified: Secondary | ICD-10-CM | POA: Diagnosis not present

## 2020-06-06 DIAGNOSIS — K739 Chronic hepatitis, unspecified: Secondary | ICD-10-CM | POA: Diagnosis not present

## 2020-06-06 DIAGNOSIS — K760 Fatty (change of) liver, not elsewhere classified: Secondary | ICD-10-CM | POA: Diagnosis not present

## 2020-06-06 DIAGNOSIS — K766 Portal hypertension: Secondary | ICD-10-CM | POA: Diagnosis not present

## 2020-06-06 DIAGNOSIS — K7589 Other specified inflammatory liver diseases: Secondary | ICD-10-CM | POA: Diagnosis not present

## 2020-06-06 DIAGNOSIS — K7460 Unspecified cirrhosis of liver: Secondary | ICD-10-CM | POA: Diagnosis not present

## 2020-06-11 ENCOUNTER — Other Ambulatory Visit: Payer: Self-pay

## 2020-06-11 ENCOUNTER — Encounter: Payer: Self-pay | Admitting: "Endocrinology

## 2020-06-11 ENCOUNTER — Ambulatory Visit (INDEPENDENT_AMBULATORY_CARE_PROVIDER_SITE_OTHER): Payer: Medicare PPO | Admitting: "Endocrinology

## 2020-06-11 VITALS — BP 118/64 | HR 72 | Ht 61.0 in | Wt 194.0 lb

## 2020-06-11 DIAGNOSIS — IMO0002 Reserved for concepts with insufficient information to code with codable children: Secondary | ICD-10-CM

## 2020-06-11 DIAGNOSIS — N183 Chronic kidney disease, stage 3 unspecified: Secondary | ICD-10-CM

## 2020-06-11 DIAGNOSIS — E1122 Type 2 diabetes mellitus with diabetic chronic kidney disease: Secondary | ICD-10-CM | POA: Diagnosis not present

## 2020-06-11 DIAGNOSIS — E1165 Type 2 diabetes mellitus with hyperglycemia: Secondary | ICD-10-CM | POA: Diagnosis not present

## 2020-06-11 DIAGNOSIS — I10 Essential (primary) hypertension: Secondary | ICD-10-CM

## 2020-06-11 MED ORDER — GLIPIZIDE ER 5 MG PO TB24: 5.0000 mg | ORAL_TABLET | Freq: Every day | ORAL | 3 refills | Status: DC

## 2020-06-11 NOTE — Progress Notes (Signed)
Endocrinology Consult Note       06/11/2020, 3:31 PM   Subjective:    Patient ID: Alisha [name], female    DOB: Sep 07, 1951.  Alisha [name] is being seen in consultation for management of currently uncontrolled symptomatic diabetes requested by  Glenda Chroman, MD.   Past Medical History:  Diagnosis Date  . B12 deficiency 11/03/2016  . B12 deficiency 11/03/2016  . Barrett's esophagus   . Cirrhosis of liver without mention of alcohol    hep B surface antigen and HCV ab negative.pt has not had hepatitis A and B vaccines.U/S on 07/04/13 shows cirrhosis.  . Diverticula of colon    pancolonic  . Esophageal reflux   . Esophageal varices ([place])   . Hiatal hernia   . Iron deficiency anemia, unspecified   . Other and unspecified hyperlipidemia   . Portal hypertensive gastropathy ([place])   . Tubular adenoma   . Type II or unspecified type diabetes mellitus without mention of complication, not stated as uncontrolled   . Unspecified essential hypertension   . Unspecified hemorrhoids without mention of complication     Past Surgical History:  Procedure Laterality Date  . ABDOMINAL HYSTERECTOMY    . BIOPSY  04/30/2016   Procedure: BIOPSY;  Surgeon: Daneil Dolin, MD;  [facility];  Service: Endoscopy;;  esophagus  . BIOPSY  02/24/2019   Procedure: BIOPSY;  Surgeon: Daneil Dolin, MD;  [facility];  Service: Endoscopy;;  . CESAREAN SECTION     x 2  . COLONOSCOPY  03/2006   left sided diverticula, splenic flexure tublar adenoma  . COLONOSCOPY  07/2002   villous tubular adenoma in rectum  . COLONOSCOPY  09/23/09   external hemorrhoids/scattered pan colonic diverticula otherwise normal. cecal lipoma bx negative, normal TI. Next TCS 09/2014  . COLONOSCOPY N/A 11/12/2014   Procedure: COLONOSCOPY;  Surgeon: Daneil Dolin, MD;  [facility];  Service: Endoscopy;   Laterality: N/A;  1215 - moved to 12:30 - Ginger to notify pt  . COLONOSCOPY N/A 01/07/2017   Rourk: Grade 4 internal hemorrhoids, 5 mm ulcer in the sigmoid colon (benign bx), 7 mm polyp removed from the cecum (tubular adenoma), diverticulosis.next tcs in five years  . ESOPHAGEAL BANDING N/A 12/20/2016   Procedure: ESOPHAGEAL BANDING;  Surgeon: Danie Binder, MD;  [facility];  Service: Endoscopy;  Laterality: N/A;  . ESOPHAGEAL BANDING N/A 03/18/2017   Procedure: ESOPHAGEAL BANDING;  Surgeon: Daneil Dolin, MD;  [facility];  Service: Endoscopy;  Laterality: N/A;  . ESOPHAGEAL BANDING  12/17/2017   Procedure: ESOPHAGEAL BANDING;  Surgeon: Daneil Dolin, MD;  [facility];  Service: Endoscopy;;  . ESOPHAGOGASTRODUODENOSCOPY  08/2006   barretts esophagus, no dyplasia  . ESOPHAGOGASTRODUODENOSCOPY  03/2006   barretts esophagus,bx focal atypia c/w low grade dysplasia, SB bx negative for celiac  . ESOPHAGOGASTRODUODENOSCOPY  09/23/09   3 columns of grade 2 esophageal varices/hiatal hernia/3-4 cm segment Barrett's esophagus without dysplasia. Next EGD 09/2012  . ESOPHAGOGASTRODUODENOSCOPY N/A 11/11/2012   Dr. Gala Romney- Barrett;s esophagus, esophageal varices, portal gastopathy.  hiatal hernia  . ESOPHAGOGASTRODUODENOSCOPY N/A 04/30/2016   Procedure: ESOPHAGOGASTRODUODENOSCOPY (EGD);  Surgeon: Daneil Dolin, MD;  [facility];  Service: Endoscopy;  Laterality: N/A;  815  . ESOPHAGOGASTRODUODENOSCOPY N/A 12/20/2016   Procedure: ESOPHAGOGASTRODUODENOSCOPY (EGD);  Surgeon: Danie Binder, MD;  [facility];  Service: Endoscopy;  Laterality: N/A;  . ESOPHAGOGASTRODUODENOSCOPY N/A 01/07/2017   Procedure: ESOPHAGOGASTRODUODENOSCOPY (EGD);  Surgeon: Daneil Dolin, MD;  [facility];  Service: Endoscopy;  Laterality: N/A;  . ESOPHAGOGASTRODUODENOSCOPY N/A 03/18/2017   Procedure: ESOPHAGOGASTRODUODENOSCOPY (EGD);  Surgeon: Daneil Dolin, MD;   [facility];  Service: Endoscopy;  Laterality: N/A;  1030   . ESOPHAGOGASTRODUODENOSCOPY N/A 12/17/2017   Procedure: ESOPHAGOGASTRODUODENOSCOPY (EGD);  Surgeon: Daneil Dolin, MD;  [facility];  Service: Endoscopy;  Laterality: N/A;  10:30am  . ESOPHAGOGASTRODUODENOSCOPY N/A 02/24/2019   Rourk: No esophageal varices seen, scars noted from prior banding.  5 cm segment of salmon-colored epithelium coming up from the GE junction consistent with Barrett's (bx with no dysplasia).  Portal gastropathy.  Small hiatal hernia.  Recommended repeat EGD in 18 months.  Marland Kitchen [place]  2008  . small bowel capsule endoscopy  10/2009   normal    Social History   Socioeconomic History  . Marital status: Married    Spouse name: Not on file  . Number of children: Not on file  . Years of education: Not on file  . Highest education level: Not on file  Occupational History  . Not on file  Tobacco Use  . Smoking status: Never Smoker  . Smokeless tobacco: Never Used  Vaping Use  . Vaping Use: Never used  Substance and Sexual Activity  . Alcohol use: No  . Drug use: No  . Sexual activity: Yes  Other Topics Concern  . Not on file  Social History Narrative  . Not on file   Social Determinants of Health   Financial Resource Strain:   . Difficulty of Paying Living Expenses: Not on file  Food Insecurity:   . Worried About Charity fundraiser in the Last Year: Not on file  . Ran Out of Food in the Last Year: Not on file  Transportation Needs:   . Lack of Transportation (Medical): Not on file  . Lack of Transportation (Non-Medical): Not on file  Physical Activity:   . Days of Exercise per Week: Not on file  . Minutes of Exercise per Session: Not on file  Stress:   . Feeling of Stress : Not on file  Social Connections:   . Frequency of Communication with Friends and Family: Not on file  . Frequency of Social Gatherings with Friends and Family: Not on file  . Attends  Religious Services: Not on file  . Active Member of Clubs or Organizations: Not on file  . Attends Archivist Meetings: Not on file  . Marital Status: Not on file    Family History  Problem Relation Age of Onset  . Alzheimer's disease Mother   . CAD Father   . Diabetes Mellitus II Father   . Alzheimer's disease Father   . Diabetes Mellitus II Sister   . Colon cancer Sister 56       small, surgical excision; chemo/rad not needed  . Cancer - Other Brother   . Diabetes Mellitus II Brother   . Hypertension Brother   . Liver disease Neg Hx     Outpatient Encounter Medications as  of 06/11/2020  Medication Sig  . ACCU-CHEK SOFTCLIX LANCETS lancets USE TO CHECK BLOOD SUGAR TWICE DAILY.  Marland Kitchen Cholecalciferol (VITAMIN D) 2000 units tablet Take 2,000 Units by mouth daily.  . cyanocobalamin 2000 MCG tablet Take 2,000 mcg by mouth daily.  Marland Kitchen glipiZIDE (GLUCOTROL XL) 5 MG 24 hr tablet Take 1 tablet (5 mg total) by mouth daily with breakfast.  . insulin aspart (NOVOLOG FLEXPEN) 100 UNIT/ML FlexPen Inject 1 Units into the skin 3 (three) times daily before meals.  Marland Kitchen lisinopril (PRINIVIL,ZESTRIL) 10 MG tablet Take 10 mg by mouth daily.   Marland Kitchen NOVOFINE 32G X 6 MM MISC USE ONE AS DIRECTED TWICE DAILY.  Marland Kitchen NOVOTWIST 32G X 5 MM MISC USE ONE AS DIRECTED TWICE DAILY.  . pantoprazole (PROTONIX) 40 MG tablet Take 1 tablet (40 mg total) by mouth 2 (two) times daily before a meal.  . propranolol (INDERAL) 10 MG tablet TAKE THREE (3) TABLETS BY MOUTH EVERY MORNING AND 3 TABLETS EVERY EVENING.  . TRESIBA FLEXTOUCH 200 UNIT/ML SOPN Inject 52 Units into the skin at bedtime. Uses sliding scale once a day  . ULTICARE PEN NEEDLES 31G X 5 MM MISC USE ONE AS DIRECTED TWICE DAILY.  Marland Kitchen VICTOZA 18 MG/3ML SOLN Inject 1.8 mg into the skin daily.   . [DISCONTINUED] GLIPIZIDE XL 10 MG 24 hr tablet Take 10 mg by mouth daily.    No facility-administered encounter medications on file as of 06/11/2020.     ALLERGIES: Allergies  Allergen Reactions  . Codeine Nausea And Vomiting    VACCINATION STATUS:  There is no immunization history on file for this patient.  Diabetes She presents for her initial diabetic visit. She has type 2 diabetes mellitus. Onset time: She was diagnosed at approximate age of 6 years. Her disease course has been fluctuating. There are no hypoglycemic associated symptoms. Pertinent negatives for hypoglycemia include no confusion, headaches, pallor or seizures. Associated symptoms include fatigue. Pertinent negatives for diabetes include no chest pain, no polydipsia, no polyphagia and no polyuria. (She did not bring any logs nor meter to review.  Patient verbally reports that she is having fluctuating glycemic profile including hypoglycemia.) There are no hypoglycemic complications. Symptoms are improving. Diabetic complications include nephropathy. Risk factors for coronary artery disease include diabetes mellitus, hypertension, obesity, post-menopausal and family history. Current diabetic treatments: She is currently on Tresiba 52 units nightly, Victoza 1.8 mg subcutaneously daily, glipizide 10 mg daily. Her weight is fluctuating minimally. She is following a generally unhealthy diet. When asked about meal planning, she reported none. She has had a previous visit with a dietitian. She participates in exercise intermittently. (She did not bring any logs nor meter to review.  Her most recent A1c was 8.1%.) An ACE inhibitor/angiotensin II receptor blocker is being taken. Eye exam is current.  Hypertension This is a chronic problem. The current episode started more than 1 year ago. The problem is controlled. Pertinent negatives include no chest pain, headaches, palpitations or shortness of breath. Risk factors for coronary artery disease include diabetes mellitus. Past treatments include ACE inhibitors. Hypertensive end-organ damage includes kidney disease. Identifiable causes of  hypertension include chronic renal disease.     Review of Systems  Constitutional: Positive for fatigue. Negative for chills, fever and unexpected weight change.  HENT: Negative for trouble swallowing and voice change.   Eyes: Negative for visual disturbance.  Respiratory: Negative for cough, shortness of breath and wheezing.   Cardiovascular: Negative for chest pain, palpitations and leg swelling.  Gastrointestinal: Negative for diarrhea, nausea and vomiting.  Endocrine: Negative for cold intolerance, heat intolerance, polydipsia, polyphagia and polyuria.  Musculoskeletal: Negative for arthralgias and myalgias.  Skin: Negative for color change, pallor, rash and wound.  Neurological: Negative for seizures and headaches.  Psychiatric/Behavioral: Negative for confusion and suicidal ideas.    Objective:    Vitals with BMI 06/11/2020 05/22/2020 05/22/2020  Height 5' 1"  - -  Weight 194 lbs - -  BMI 16.10 - -  Systolic 960 454 098  Diastolic 64 42 43  Pulse 72 74 69    BP 118/64   Pulse 72   Ht 5' 1"  (1.549 m)   Wt 194 lb (88 kg)   BMI 36.66 kg/m   Wt Readings from Last 3 Encounters:  06/11/20 194 lb (88 kg)  05/07/20 194 lb (88 kg)  04/02/20 196 lb 3.2 oz (89 kg)     Physical Exam Constitutional:      Appearance: She is well-developed.  HENT:     Head: Normocephalic and atraumatic.  Neck:     Thyroid: No thyromegaly.     Trachea: No tracheal deviation.  Cardiovascular:     Rate and Rhythm: Normal rate and regular rhythm.  Pulmonary:     Effort: Pulmonary effort is normal.  Abdominal:     Tenderness: There is no abdominal tenderness. There is no guarding.  Musculoskeletal:        General: Normal range of motion.     Cervical back: Normal range of motion and neck supple.     Comments: Her foot exam was normal today.  Skin:    General: Skin is warm and dry.     Coloration: Skin is not pale.     Findings: No erythema or rash.  Neurological:     Mental Status: She is  alert and oriented to person, place, and time.     Cranial Nerves: No cranial nerve deficit.     Coordination: Coordination normal.     Deep Tendon Reflexes: Reflexes are normal and symmetric.  Psychiatric:        Judgment: Judgment normal.       CMP ( most recent) CMP     Component Value Date/Time   NA 135 05/29/2020 1416   K 4.4 05/29/2020 1416   CL 106 05/29/2020 1416   CO2 23 05/29/2020 1416   GLUCOSE 236 (H) 05/29/2020 1416   BUN 21 05/29/2020 1416   BUN 15 01/25/2020 0000   CREATININE 1.68 (H) 05/29/2020 1416   CREATININE 1.62 (H) 03/28/2018 1146   CALCIUM 9.0 05/29/2020 1416   CALCIUM 9.1 01/25/2020 1059   PROT 6.6 05/06/2020 1107   ALBUMIN 3.6 05/29/2020 1416   AST 28 05/06/2020 1107   ALT 16 05/06/2020 1107   ALKPHOS 76 05/06/2020 1107   BILITOT 3.2 (H) 05/06/2020 1107   GFRNONAA 31 (L) 05/29/2020 1416   GFRAA 36 (L) 05/29/2020 1416     Diabetic Labs (most recent): Lab Results  Component Value Date   HGBA1C 8.1 05/07/2020   HGBA1C 10.9 (H) 12/19/2016     Lipid Panel ( most recent) Lipid Panel     Component Value Date/Time   CHOL 144 01/25/2020 0000   TRIG 239 (A) 01/25/2020 0000   HDL 28 (A) 01/25/2020 0000   LDLCALC 67 01/25/2020 0000      Lab Results  Component Value Date   TSH 2.46 01/25/2020      Assessment & Plan:   1. Uncontrolled type 2  diabetes mellitus with stage 3 chronic kidney disease ([place])  - Alisha [name] has currently uncontrolled symptomatic type 2 DM since 69 years of age,  with most recent A1c of 8.1 %, overall improving from 10.9%.  Recent labs reviewed. - I had a long discussion with her about the progressive nature of diabetes and the pathology behind its complications. -her diabetes is complicated by CKD and she remains at a high risk for more acute and chronic complications which include CAD, CVA, CKD, retinopathy, and neuropathy. These are all discussed in detail with her.  - I have counseled her on diet  and  weight management  by adopting a carbohydrate restricted/protein rich diet. Patient is encouraged to switch to  unprocessed or minimally processed     complex starch and increased protein intake (animal or plant source), fruits, and vegetables. -  she is advised to stick to a routine mealtimes to eat 3 meals  a day and avoid unnecessary snacks ( to snack only to correct hypoglycemia).   - she admits that there is a room for improvement in her food and drink choices. - Suggestion is made for her to avoid simple carbohydrates  from her diet including Cakes, Sweet Desserts, Ice Cream, Soda (diet and regular), Sweet Tea, Candies, Chips, Cookies, Store Bought Juices, Alcohol in Excess of  1-2 drinks a day, Artificial Sweeteners,  Coffee Creamer, and "Sugar-free" Products. This will help patient to have more stable blood glucose profile and potentially avoid unintended weight gain.  - she will be scheduled with Jearld Fenton, RDN, CDE for diabetes education.  - I have approached her with the following individualized plan to manage  her diabetes and patient agrees:   - she has required multiple daily injections of insulin to control diabetes in the past,. -However, due to her recent concern for hypoglycemia, she will be continued on her basal insulin Tresiba at 52 units nightly, advised and agrees to start monitoring blood glucose 4 times a day-before meals and at bedtime and return for a visit with her meter and logs in 7 days . -This patient will benefit from a CGM.  She will be considered for the freestyle libre device after documenting medical necessity.  -She will be approached for prandial insulin if she returns with significant postprandial hyperglycemia.    - she is warned not to take insulin without proper monitoring per orders. - she is encouraged to call clinic for blood glucose levels less than 70 or above 200 mg /dl. - she is advised to continue Victoza 1.8 mg subcutaneously daily,  therapeutically suitable for patient . - her glipizide will be lowered to 5 mg p.o. daily at breakfast due to reported random hypoglycemia.   - she is not a candidate for Metformin, SGLT2 inhibitors due to concurrent renal insufficiency.  - Specific targets for  A1c;  LDL, HDL,  and Triglycerides were discussed with the patient.  2) Blood Pressure /Hypertension:  her blood pressure is  controlled to target.   she is advised to continue her current medications including lisinopril 10 mg p.o. daily with breakfast . 3) Lipids/Hyperlipidemia:   Review of her recent lipid panel showed  controlled  LDL at 67 .  she  is not on statins.  She will be considered for fasting lipid panel and low-dose statins on subsequent visits.    4)  Weight/Diet:  Body mass index is 36.66 kg/m.  -   clearly complicating her diabetes care.   she is  a candidate for weight loss. I discussed with her the fact that loss of 5 - 10% of her  current body weight will have the most impact on her diabetes management.  Exercise, and detailed carbohydrates information provided  -  detailed on discharge instructions.  5) Chronic Care/Health Maintenance:  -she  is on ACEI Statin medications and  is encouraged to initiate and continue to follow up with Ophthalmology, Dentist,  Podiatrist at least yearly or according to recommendations, and advised to   stay away from smoking. I have recommended yearly flu vaccine and pneumonia vaccine at least every 5 years; moderate intensity exercise for up to 150 minutes weekly; and  sleep for at least 7 hours a day.  - she is  advised to maintain close follow up with Glenda Chroman, MD for primary care needs, as well as her other providers for optimal and coordinated care.   - Time spent in this patient care: 60 min, of which > 50% was spent in  counseling  her about her currently uncontrolled type 2 diabetes, hypertension, weight management and the rest reviewing her blood glucose logs , discussing  her hypoglycemia and hyperglycemia episodes, reviewing her current and  previous labs / studies  ( including abstraction from other facilities) and medications  doses and developing a  long term treatment plan based on the latest standards of care/ guidelines; and documenting her care.    Please refer to Patient Instructions for Blood Glucose Monitoring and Insulin/Medications Dosing Guide"  in media tab for additional information. Please  also refer to " Patient Self Inventory" in the Media  tab for reviewed elements of pertinent patient history.  Alisha Gobble Howes participated in the discussions, expressed understanding, and voiced agreement with the above plans.  All questions were answered to her satisfaction. she is encouraged to contact clinic should she have any questions or concerns prior to her return visit.   Follow up plan: - Return in about 1 week (around 06/18/2020) for F/U with Meter and Logs Only - no Labs.  Glade Lloyd, MD [facility] [address] [place], [place] [address] Phone: [phone]  Fax: [phone]    06/11/2020, 3:31 PM  This note was partially dictated with voice recognition software. Similar sounding words can be transcribed inadequately or may not  be corrected upon review.

## 2020-06-11 NOTE — Patient Instructions (Signed)

## 2020-06-13 DIAGNOSIS — E1165 Type 2 diabetes mellitus with hyperglycemia: Secondary | ICD-10-CM | POA: Diagnosis not present

## 2020-06-18 ENCOUNTER — Ambulatory Visit (INDEPENDENT_AMBULATORY_CARE_PROVIDER_SITE_OTHER): Payer: Medicare PPO | Admitting: "Endocrinology

## 2020-06-18 ENCOUNTER — Other Ambulatory Visit: Payer: Self-pay

## 2020-06-18 ENCOUNTER — Encounter: Payer: Self-pay | Admitting: "Endocrinology

## 2020-06-18 VITALS — BP 144/68 | HR 78 | Ht 61.0 in | Wt 192.0 lb

## 2020-06-18 DIAGNOSIS — IMO0002 Reserved for concepts with insufficient information to code with codable children: Secondary | ICD-10-CM

## 2020-06-18 DIAGNOSIS — E1165 Type 2 diabetes mellitus with hyperglycemia: Secondary | ICD-10-CM

## 2020-06-18 DIAGNOSIS — I10 Essential (primary) hypertension: Secondary | ICD-10-CM

## 2020-06-18 DIAGNOSIS — E1122 Type 2 diabetes mellitus with diabetic chronic kidney disease: Secondary | ICD-10-CM | POA: Diagnosis not present

## 2020-06-18 DIAGNOSIS — N183 Chronic kidney disease, stage 3 unspecified: Secondary | ICD-10-CM | POA: Diagnosis not present

## 2020-06-18 MED ORDER — ACCU-CHEK SOFTCLIX LANCETS MISC
1 refills | Status: DC
Start: 2020-06-18 — End: 2021-02-14

## 2020-06-18 NOTE — Progress Notes (Signed)
06/18/2020, 4:10 PM  Endocrinology follow-up note   Subjective:    Patient ID: Alisha [name], female    DOB: 03-08-1951.  Alisha [name] is being seen in follow-up after she was seen in consultation for management of currently uncontrolled symptomatic diabetes requested by  Glenda Chroman, MD.   Past Medical History:  Diagnosis Date  . B12 deficiency 11/03/2016  . B12 deficiency 11/03/2016  . Barrett's esophagus   . Cirrhosis of liver without mention of alcohol    hep B surface antigen and HCV ab negative.pt has not had hepatitis A and B vaccines.U/S on 07/04/13 shows cirrhosis.  . Diverticula of colon    pancolonic  . Esophageal reflux   . Esophageal varices ([place])   . Hiatal hernia   . Iron deficiency anemia, unspecified   . Other and unspecified hyperlipidemia   . Portal hypertensive gastropathy ([place])   . Tubular adenoma   . Type II or unspecified type diabetes mellitus without mention of complication, not stated as uncontrolled   . Unspecified essential hypertension   . Unspecified hemorrhoids without mention of complication     Past Surgical History:  Procedure Laterality Date  . ABDOMINAL HYSTERECTOMY    . BIOPSY  04/30/2016   Procedure: BIOPSY;  Surgeon: Daneil Dolin, MD;  [facility];  Service: Endoscopy;;  esophagus  . BIOPSY  02/24/2019   Procedure: BIOPSY;  Surgeon: Daneil Dolin, MD;  [facility];  Service: Endoscopy;;  . CESAREAN SECTION     x 2  . COLONOSCOPY  03/2006   left sided diverticula, splenic flexure tublar adenoma  . COLONOSCOPY  07/2002   villous tubular adenoma in rectum  . COLONOSCOPY  09/23/09   external hemorrhoids/scattered pan colonic diverticula otherwise normal. cecal lipoma bx negative, normal TI. Next TCS 09/2014  . COLONOSCOPY N/A 11/12/2014   Procedure: COLONOSCOPY;  Surgeon: Daneil Dolin, MD;  [facility];   Service: Endoscopy;  Laterality: N/A;  1215 - moved to 12:30 - Ginger to notify pt  . COLONOSCOPY N/A 01/07/2017   Rourk: Grade 4 internal hemorrhoids, 5 mm ulcer in the sigmoid colon (benign bx), 7 mm polyp removed from the cecum (tubular adenoma), diverticulosis.next tcs in five years  . ESOPHAGEAL BANDING N/A 12/20/2016   Procedure: ESOPHAGEAL BANDING;  Surgeon: Danie Binder, MD;  [facility];  Service: Endoscopy;  Laterality: N/A;  . ESOPHAGEAL BANDING N/A 03/18/2017   Procedure: ESOPHAGEAL BANDING;  Surgeon: Daneil Dolin, MD;  [facility];  Service: Endoscopy;  Laterality: N/A;  . ESOPHAGEAL BANDING  12/17/2017   Procedure: ESOPHAGEAL BANDING;  Surgeon: Daneil Dolin, MD;  [facility];  Service: Endoscopy;;  . ESOPHAGOGASTRODUODENOSCOPY  08/2006   barretts esophagus, no dyplasia  . ESOPHAGOGASTRODUODENOSCOPY  03/2006   barretts esophagus,bx focal atypia c/w low grade dysplasia, SB bx negative for celiac  . ESOPHAGOGASTRODUODENOSCOPY  09/23/09   3 columns of grade 2 esophageal varices/hiatal hernia/3-4 cm segment Barrett's esophagus without dysplasia. Next EGD 09/2012  . ESOPHAGOGASTRODUODENOSCOPY N/A 11/11/2012   Dr. Gala Romney- Barrett;s esophagus, esophageal varices, portal gastopathy. hiatal hernia  .  ESOPHAGOGASTRODUODENOSCOPY N/A 04/30/2016   Procedure: ESOPHAGOGASTRODUODENOSCOPY (EGD);  Surgeon: Daneil Dolin, MD;  [facility];  Service: Endoscopy;  Laterality: N/A;  815  . ESOPHAGOGASTRODUODENOSCOPY N/A 12/20/2016   Procedure: ESOPHAGOGASTRODUODENOSCOPY (EGD);  Surgeon: Danie Binder, MD;  [facility];  Service: Endoscopy;  Laterality: N/A;  . ESOPHAGOGASTRODUODENOSCOPY N/A 01/07/2017   Procedure: ESOPHAGOGASTRODUODENOSCOPY (EGD);  Surgeon: Daneil Dolin, MD;  [facility];  Service: Endoscopy;  Laterality: N/A;  . ESOPHAGOGASTRODUODENOSCOPY N/A 03/18/2017   Procedure: ESOPHAGOGASTRODUODENOSCOPY (EGD);  Surgeon: Daneil Dolin, MD;  [facility];  Service: Endoscopy;  Laterality: N/A;  1030   . ESOPHAGOGASTRODUODENOSCOPY N/A 12/17/2017   Procedure: ESOPHAGOGASTRODUODENOSCOPY (EGD);  Surgeon: Daneil Dolin, MD;  [facility];  Service: Endoscopy;  Laterality: N/A;  10:30am  . ESOPHAGOGASTRODUODENOSCOPY N/A 02/24/2019   Rourk: No esophageal varices seen, scars noted from prior banding.  5 cm segment of salmon-colored epithelium coming up from the GE junction consistent with Barrett's (bx with no dysplasia).  Portal gastropathy.  Small hiatal hernia.  Recommended repeat EGD in 18 months.  Marland Kitchen [place]  2008  . small bowel capsule endoscopy  10/2009   normal    Social History   Socioeconomic History  . Marital status: Married    Spouse name: Not on file  . Number of children: Not on file  . Years of education: Not on file  . Highest education level: Not on file  Occupational History  . Not on file  Tobacco Use  . Smoking status: Never Smoker  . Smokeless tobacco: Never Used  Vaping Use  . Vaping Use: Never used  Substance and Sexual Activity  . Alcohol use: No  . Drug use: No  . Sexual activity: Yes  Other Topics Concern  . Not on file  Social History Narrative  . Not on file   Social Determinants of Health   Financial Resource Strain:   . Difficulty of Paying Living Expenses: Not on file  Food Insecurity:   . Worried About Charity fundraiser in the Last Year: Not on file  . Ran Out of Food in the Last Year: Not on file  Transportation Needs:   . Lack of Transportation (Medical): Not on file  . Lack of Transportation (Non-Medical): Not on file  Physical Activity:   . Days of Exercise per Week: Not on file  . Minutes of Exercise per Session: Not on file  Stress:   . Feeling of Stress : Not on file  Social Connections:   . Frequency of Communication with Friends and Family: Not on file  . Frequency of Social Gatherings with Friends and Family: Not on file   . Attends Religious Services: Not on file  . Active Member of Clubs or Organizations: Not on file  . Attends Archivist Meetings: Not on file  . Marital Status: Not on file    Family History  Problem Relation Age of Onset  . Alzheimer's disease Mother   . CAD Father   . Diabetes Mellitus II Father   . Alzheimer's disease Father   . Diabetes Mellitus II Sister   . Colon cancer Sister 77       small, surgical excision; chemo/rad not needed  . Cancer - Other Brother   . Diabetes Mellitus II Brother   . Hypertension Brother   . Liver disease Neg Hx     Outpatient Encounter Medications as of 06/18/2020  Medication  Sig  . Accu-Chek Softclix Lancets lancets USE TO CHECK BLOOD SUGAR TWICE DAILY.  Marland Kitchen Cholecalciferol (VITAMIN D) 2000 units tablet Take 2,000 Units by mouth daily.  . cyanocobalamin 2000 MCG tablet Take 2,000 mcg by mouth daily.  Marland Kitchen glipiZIDE (GLUCOTROL XL) 5 MG 24 hr tablet Take 1 tablet (5 mg total) by mouth daily with breakfast.  . insulin aspart (NOVOLOG FLEXPEN) 100 UNIT/ML FlexPen Inject 1 Units into the skin 3 (three) times daily before meals.  Marland Kitchen lisinopril (PRINIVIL,ZESTRIL) 10 MG tablet Take 10 mg by mouth daily.   Marland Kitchen NOVOFINE 32G X 6 MM MISC USE ONE AS DIRECTED TWICE DAILY.  . pantoprazole (PROTONIX) 40 MG tablet Take 1 tablet (40 mg total) by mouth 2 (two) times daily before a meal.  . propranolol (INDERAL) 10 MG tablet TAKE THREE (3) TABLETS BY MOUTH EVERY MORNING AND 3 TABLETS EVERY EVENING.  . TRESIBA FLEXTOUCH 200 UNIT/ML SOPN Inject 50 Units into the skin at bedtime.  Marland Kitchen ULTICARE PEN NEEDLES 31G X 5 MM MISC USE ONE AS DIRECTED TWICE DAILY.  Marland Kitchen VICTOZA 18 MG/3ML SOLN Inject 1.8 mg into the skin daily.   . [DISCONTINUED] ACCU-CHEK SOFTCLIX LANCETS lancets USE TO CHECK BLOOD SUGAR TWICE DAILY.  . [DISCONTINUED] NOVOTWIST 32G X 5 MM MISC USE ONE AS DIRECTED TWICE DAILY.   No facility-administered encounter medications on file as of 06/18/2020.     ALLERGIES: Allergies  Allergen Reactions  . Codeine Nausea And Vomiting    VACCINATION STATUS:  There is no immunization history on file for this patient.  Diabetes She presents for her follow-up diabetic visit. She has type 2 diabetes mellitus. Onset time: She was diagnosed at approximate age of 38 years. Her disease course has been improving. There are no hypoglycemic associated symptoms. Pertinent negatives for hypoglycemia include no confusion, headaches, pallor or seizures. Pertinent negatives for diabetes include no chest pain, no fatigue, no polydipsia, no polyphagia and no polyuria. (She did not bring any logs nor meter to review.  Patient verbally reports that she is having fluctuating glycemic profile including hypoglycemia.) There are no hypoglycemic complications. Symptoms are improving. Diabetic complications include nephropathy. Risk factors for coronary artery disease include diabetes mellitus, hypertension, obesity, post-menopausal and family history. Current diabetic treatments: She is currently on Tresiba 52 units nightly, Victoza 1.8 mg subcutaneously daily, glipizide 10 mg daily. Her weight is fluctuating minimally. She is following a generally unhealthy diet. When asked about meal planning, she reported none. She has had a previous visit with a dietitian. She participates in exercise intermittently. Her home blood glucose trend is decreasing steadily. Her breakfast blood glucose range is generally 110-130 mg/dl. Her lunch blood glucose range is generally 130-140 mg/dl. Her dinner blood glucose range is generally 130-140 mg/dl. Her bedtime blood glucose range is generally 130-140 mg/dl. Her overall blood glucose range is 130-140 mg/dl. (She presents with significantly improved glycemic profile was fasting and postprandial.  She did not document hypoglycemia.  Her most recent A1c was 8.1%. ) An ACE inhibitor/angiotensin II receptor blocker is being taken. Eye exam is current.   Hypertension This is a chronic problem. The current episode started more than 1 year ago. The problem is controlled. Pertinent negatives include no chest pain, headaches, palpitations or shortness of breath. Risk factors for coronary artery disease include diabetes mellitus. Past treatments include ACE inhibitors. Hypertensive end-organ damage includes kidney disease. Identifiable causes of hypertension include chronic renal disease.     Review of Systems  Constitutional: Negative for  chills, fatigue, fever and unexpected weight change.  HENT: Negative for trouble swallowing and voice change.   Eyes: Negative for visual disturbance.  Respiratory: Negative for cough, shortness of breath and wheezing.   Cardiovascular: Negative for chest pain, palpitations and leg swelling.  Gastrointestinal: Negative for diarrhea, nausea and vomiting.  Endocrine: Negative for cold intolerance, heat intolerance, polydipsia, polyphagia and polyuria.  Musculoskeletal: Negative for arthralgias and myalgias.  Skin: Negative for color change, pallor, rash and wound.  Neurological: Negative for seizures and headaches.  Psychiatric/Behavioral: Negative for confusion and suicidal ideas.    Objective:    Vitals with BMI 06/18/2020 06/11/2020 05/22/2020  Height 5' 1"  5' 1"  -  Weight 192 lbs 194 lbs -  BMI 65.9 93.57 -  Systolic 017 793 903  Diastolic 68 64 42  Pulse 78 72 74    BP (!) 144/68   Pulse 78   Ht 5' 1"  (1.549 m)   Wt 192 lb (87.1 kg)   BMI 36.28 kg/m   Wt Readings from Last 3 Encounters:  06/18/20 192 lb (87.1 kg)  06/11/20 194 lb (88 kg)  05/07/20 194 lb (88 kg)     Physical Exam Constitutional:      Appearance: She is well-developed.  HENT:     Head: Normocephalic and atraumatic.  Neck:     Thyroid: No thyromegaly.     Trachea: No tracheal deviation.  Cardiovascular:     Rate and Rhythm: Normal rate and regular rhythm.  Pulmonary:     Effort: Pulmonary effort is normal.   Abdominal:     Tenderness: There is no abdominal tenderness. There is no guarding.  Musculoskeletal:        General: Normal range of motion.     Cervical back: Normal range of motion and neck supple.     Comments: Her foot exam was normal today.  Skin:    General: Skin is warm and dry.     Coloration: Skin is not pale.     Findings: No erythema or rash.  Neurological:     Mental Status: She is alert and oriented to person, place, and time.     Cranial Nerves: No cranial nerve deficit.     Coordination: Coordination normal.     Deep Tendon Reflexes: Reflexes are normal and symmetric.  Psychiatric:        Judgment: Judgment normal.       CMP ( most recent) CMP     Component Value Date/Time   NA 135 05/29/2020 1416   K 4.4 05/29/2020 1416   CL 106 05/29/2020 1416   CO2 23 05/29/2020 1416   GLUCOSE 236 (H) 05/29/2020 1416   BUN 21 05/29/2020 1416   BUN 15 01/25/2020 0000   CREATININE 1.68 (H) 05/29/2020 1416   CREATININE 1.62 (H) 03/28/2018 1146   CALCIUM 9.0 05/29/2020 1416   CALCIUM 9.1 01/25/2020 1059   PROT 6.6 05/06/2020 1107   ALBUMIN 3.6 05/29/2020 1416   AST 28 05/06/2020 1107   ALT 16 05/06/2020 1107   ALKPHOS 76 05/06/2020 1107   BILITOT 3.2 (H) 05/06/2020 1107   GFRNONAA 31 (L) 05/29/2020 1416   GFRAA 36 (L) 05/29/2020 1416     Diabetic Labs (most recent): Lab Results  Component Value Date   HGBA1C 8.1 05/07/2020   HGBA1C 10.9 (H) 12/19/2016     Lipid Panel ( most recent) Lipid Panel     Component Value Date/Time   CHOL 144 01/25/2020 0000   TRIG 239 (  A) 01/25/2020 0000   HDL 28 (A) 01/25/2020 0000   LDLCALC 67 01/25/2020 0000      Lab Results  Component Value Date   TSH 2.46 01/25/2020      Assessment & Plan:   1. Uncontrolled type 2 diabetes mellitus with stage 3 chronic kidney disease ([place])  - Alisha [name] has currently uncontrolled symptomatic type 2 DM since 69 years of age.  She presents with significantly improved  glycemic profile was fasting and postprandial.  She did not document hypoglycemia.  Her most recent A1c was 8.1%.  overall improving from 10.9%.  Recent labs reviewed. - I had a long discussion with her about the progressive nature of diabetes and the pathology behind its complications. -her diabetes is complicated by CKD and she remains at a high risk for more acute and chronic complications which include CAD, CVA, CKD, retinopathy, and neuropathy. These are all discussed in detail with her.  - I have counseled her on diet  and weight management  by adopting a carbohydrate restricted/protein rich diet. Patient is encouraged to switch to  unprocessed or minimally processed     complex starch and increased protein intake (animal or plant source), fruits, and vegetables. -  she is advised to stick to a routine mealtimes to eat 3 meals  a day and avoid unnecessary snacks ( to snack only to correct hypoglycemia).   - she  admits there is a room for improvement in her diet and drink choices. -  Suggestion is made for her to avoid simple carbohydrates  from her diet including Cakes, Sweet Desserts / Pastries, Ice Cream, Soda (diet and regular), Sweet Tea, Candies, Chips, Cookies, Sweet Pastries,  Store Bought Juices, Alcohol in Excess of  1-2 drinks a day, Artificial Sweeteners, Coffee Creamer, and "Sugar-free" Products. This will help patient to have stable blood glucose profile and potentially avoid unintended weight gain.  - she will be scheduled with Jearld Fenton, RDN, CDE for diabetes education.  - I have approached her with the following individualized plan to manage  her diabetes and patient agrees:   - she has required multiple daily injections of insulin to control diabetes in the past,. -However, recently she is documenting tight glycemic profile.  She is advised to lower her Tyler Aas to 50 units nightly, advised to continue to monitor blood glucose twice a day-daily before breakfast and at  bedtime.   -She will not need prandial insulin for now.  - she is warned not to take insulin without proper monitoring per orders. - she is encouraged to call clinic for blood glucose levels less than 70 or above 200 mg /dl. - she is advised to continue Victoza 1.8 mg subcutaneously daily, therapeutically suitable for patient . -She will benefit from continued intervention with glipizide.  She is advised to continue glipizide 5 mg XL daily with breakfast.     - she is not a candidate for Metformin, SGLT2 inhibitors due to concurrent renal insufficiency.  - Specific targets for  A1c;  LDL, HDL,  and Triglycerides were discussed with the patient.  2) Blood Pressure /Hypertension: Her blood pressure is not controlled to target.  she is advised to continue her current medications including lisinopril 10 mg p.o. daily with breakfast . 3) Lipids/Hyperlipidemia:   Review of her recent lipid panel showed  controlled  LDL at 67 .  she  is not on statins.  She will be considered for fasting lipid panel and low-dose statins on subsequent  visits.    4)  Weight/Diet:  Body mass index is 36.28 kg/m.  -   clearly complicating her diabetes care.   she is  a candidate for weight loss. I discussed with her the fact that loss of 5 - 10% of her  current body weight will have the most impact on her diabetes management.  Exercise, and detailed carbohydrates information provided  -  detailed on discharge instructions.  5) Chronic Care/Health Maintenance:  -she  is on ACEI Statin medications and  is encouraged to initiate and continue to follow up with Ophthalmology, Dentist,  Podiatrist at least yearly or according to recommendations, and advised to   stay away from smoking. I have recommended yearly flu vaccine and pneumonia vaccine at least every 5 years; moderate intensity exercise for up to 150 minutes weekly; and  sleep for at least 7 hours a day.  POCT ABI Results 06/18/20  Her ABI is normal. Right ABI: 1.28       left ABI: 1.22  Right leg systolic / diastolic: 502/77 mmHg Left leg systolic / diastolic: 412/87 mmHg  Arm systolic / diastolic: 867/67 mmHG  This is normal ABI, will be repeated in 5 years.  - she is  advised to maintain close follow up with Glenda Chroman, MD for primary care needs, as well as her other providers for optimal and coordinated care.  - Time spent on this patient care encounter:  35 min, of which > 50% was spent in  counseling and the rest reviewing her blood glucose logs , discussing her hypoglycemia and hyperglycemia episodes, reviewing her current and  previous labs / studies  ( including abstraction from other facilities) and medications  doses and developing a  long term treatment plan and documenting her care.   Please refer to Patient Instructions for Blood Glucose Monitoring and Insulin/Medications Dosing Guide"  in media tab for additional information. Please  also refer to " Patient Self Inventory" in the Media  tab for reviewed elements of pertinent patient history.  Alisha [name] participated in the discussions, expressed understanding, and voiced agreement with the above plans.  All questions were answered to her satisfaction. she is encouraged to contact clinic should she have any questions or concerns prior to her return visit.   Alisha [name] participated in the discussions, expressed understanding, and voiced agreement with the above plans.  All questions were answered to her satisfaction. she is encouraged to contact clinic should she have any questions or concerns prior to her return visit.   Follow up plan: - Return in about 9 weeks (around 08/20/2020) for Bring Meter and Logs- A1c in Office.  Glade Lloyd, MD [facility] [address] [place], [place] [address] Phone: [phone]  Fax: [phone]    06/18/2020, 4:10 PM  This note was partially dictated with voice recognition  software. Similar sounding words can be transcribed inadequately or may not  be corrected upon review.

## 2020-06-18 NOTE — Patient Instructions (Signed)

## 2020-06-20 DIAGNOSIS — I10 Essential (primary) hypertension: Secondary | ICD-10-CM | POA: Diagnosis not present

## 2020-06-20 DIAGNOSIS — Z299 Encounter for prophylactic measures, unspecified: Secondary | ICD-10-CM | POA: Diagnosis not present

## 2020-06-20 DIAGNOSIS — B079 Viral wart, unspecified: Secondary | ICD-10-CM | POA: Diagnosis not present

## 2020-07-02 ENCOUNTER — Other Ambulatory Visit (HOSPITAL_COMMUNITY)
Admission: RE | Admit: 2020-07-02 | Discharge: 2020-07-02 | Disposition: A | Discharge status: Home / Self Care | Payer: Medicare PPO | Source: Ambulatory Visit | Attending: Internal Medicine | Admitting: Internal Medicine

## 2020-07-02 ENCOUNTER — Other Ambulatory Visit: Payer: Self-pay

## 2020-07-02 DIAGNOSIS — Z01812 Encounter for preprocedural laboratory examination: Secondary | ICD-10-CM | POA: Insufficient documentation

## 2020-07-02 DIAGNOSIS — Z20822 Contact with and (suspected) exposure to covid-19: Secondary | ICD-10-CM | POA: Diagnosis not present

## 2020-07-02 LAB — SARS CORONAVIRUS 2 (TAT 6-24 HRS): SARS Coronavirus 2: NEGATIVE

## 2020-07-03 ENCOUNTER — Ambulatory Visit (HOSPITAL_COMMUNITY)
Admission: RE | Admit: 2020-07-03 | Discharge: 2020-07-03 | Disposition: A | Discharge status: Home / Self Care | Payer: Medicare PPO | Attending: Internal Medicine | Admitting: Internal Medicine

## 2020-07-03 ENCOUNTER — Encounter (HOSPITAL_COMMUNITY): Payer: Self-pay | Admitting: Internal Medicine

## 2020-07-03 ENCOUNTER — Encounter (HOSPITAL_COMMUNITY)
Admission: RE | Disposition: A | Discharge status: Home / Self Care | Payer: Self-pay | Source: Home / Self Care | Attending: Internal Medicine

## 2020-07-03 ENCOUNTER — Encounter (HOSPITAL_COMMUNITY): Payer: Self-pay | Admitting: Anesthesiology

## 2020-07-03 ENCOUNTER — Other Ambulatory Visit: Payer: Self-pay

## 2020-07-03 DIAGNOSIS — D509 Iron deficiency anemia, unspecified: Secondary | ICD-10-CM | POA: Diagnosis not present

## 2020-07-03 DIAGNOSIS — E785 Hyperlipidemia, unspecified: Secondary | ICD-10-CM | POA: Diagnosis not present

## 2020-07-03 DIAGNOSIS — Z833 Family history of diabetes mellitus: Secondary | ICD-10-CM | POA: Diagnosis not present

## 2020-07-03 DIAGNOSIS — K219 Gastro-esophageal reflux disease without esophagitis: Secondary | ICD-10-CM | POA: Insufficient documentation

## 2020-07-03 DIAGNOSIS — Z90710 Acquired absence of both cervix and uterus: Secondary | ICD-10-CM | POA: Insufficient documentation

## 2020-07-03 DIAGNOSIS — K3189 Other diseases of stomach and duodenum: Secondary | ICD-10-CM | POA: Insufficient documentation

## 2020-07-03 DIAGNOSIS — Z8249 Family history of ischemic heart disease and other diseases of the circulatory system: Secondary | ICD-10-CM | POA: Diagnosis not present

## 2020-07-03 DIAGNOSIS — I8510 Secondary esophageal varices without bleeding: Secondary | ICD-10-CM | POA: Diagnosis not present

## 2020-07-03 DIAGNOSIS — K766 Portal hypertension: Secondary | ICD-10-CM | POA: Insufficient documentation

## 2020-07-03 DIAGNOSIS — K449 Diaphragmatic hernia without obstruction or gangrene: Secondary | ICD-10-CM | POA: Insufficient documentation

## 2020-07-03 DIAGNOSIS — Z809 Family history of malignant neoplasm, unspecified: Secondary | ICD-10-CM | POA: Diagnosis not present

## 2020-07-03 DIAGNOSIS — Z794 Long term (current) use of insulin: Secondary | ICD-10-CM | POA: Insufficient documentation

## 2020-07-03 DIAGNOSIS — Z86010 Personal history of colonic polyps: Secondary | ICD-10-CM | POA: Insufficient documentation

## 2020-07-03 DIAGNOSIS — E119 Type 2 diabetes mellitus without complications: Secondary | ICD-10-CM | POA: Insufficient documentation

## 2020-07-03 DIAGNOSIS — K2270 Barrett's esophagus without dysplasia: Secondary | ICD-10-CM | POA: Insufficient documentation

## 2020-07-03 DIAGNOSIS — Z800 Family history of malignant neoplasm of digestive organs: Secondary | ICD-10-CM | POA: Insufficient documentation

## 2020-07-03 DIAGNOSIS — I8500 Esophageal varices without bleeding: Secondary | ICD-10-CM | POA: Diagnosis present

## 2020-07-03 DIAGNOSIS — Z885 Allergy status to narcotic agent status: Secondary | ICD-10-CM | POA: Insufficient documentation

## 2020-07-03 DIAGNOSIS — Z79899 Other long term (current) drug therapy: Secondary | ICD-10-CM | POA: Insufficient documentation

## 2020-07-03 DIAGNOSIS — Z820 Family history of epilepsy and other diseases of the nervous system: Secondary | ICD-10-CM | POA: Insufficient documentation

## 2020-07-03 HISTORY — PX: ESOPHAGOGASTRODUODENOSCOPY: SHX5428

## 2020-07-03 HISTORY — PX: ESOPHAGEAL BANDING: SHX5518

## 2020-07-03 LAB — GLUCOSE, CAPILLARY: Glucose-Capillary: 98 mg/dL (ref 70–99)

## 2020-07-03 SURGERY — EGD (ESOPHAGOGASTRODUODENOSCOPY)
Anesthesia: Moderate Sedation

## 2020-07-03 MED ORDER — STERILE WATER FOR IRRIGATION IR SOLN
Status: DC | PRN
  Administered 2020-07-03: 2.5 mL

## 2020-07-03 MED ORDER — LIDOCAINE VISCOUS HCL 2 % MT SOLN
OROMUCOSAL | Status: DC | PRN
  Administered 2020-07-03: 1 via OROMUCOSAL

## 2020-07-03 MED ORDER — MEPERIDINE HCL 50 MG/ML IJ SOLN
INTRAMUSCULAR | Status: AC
  Filled 2020-07-03: qty 1

## 2020-07-03 MED ORDER — MIDAZOLAM HCL 5 MG/5ML IJ SOLN
INTRAMUSCULAR | Status: AC
  Filled 2020-07-03: qty 10

## 2020-07-03 MED ORDER — MIDAZOLAM HCL 5 MG/5ML IJ SOLN
INTRAMUSCULAR | Status: DC | PRN
  Administered 2020-07-03: 1 mg via INTRAVENOUS
  Administered 2020-07-03: 2 mg via INTRAVENOUS
  Administered 2020-07-03: 1 mg via INTRAVENOUS

## 2020-07-03 MED ORDER — MEPERIDINE HCL 100 MG/ML IJ SOLN
INTRAMUSCULAR | Status: DC | PRN
Start: 2020-07-03 — End: 2020-07-03
  Administered 2020-07-03: 25 mg via INTRAVENOUS

## 2020-07-03 MED ORDER — ONDANSETRON HCL 4 MG/2ML IJ SOLN
INTRAMUSCULAR | Status: DC | PRN
  Administered 2020-07-03: 4 mg via INTRAVENOUS

## 2020-07-03 MED ORDER — LIDOCAINE VISCOUS HCL 2 % MT SOLN
OROMUCOSAL | Status: AC
  Filled 2020-07-03: qty 15

## 2020-07-03 MED ORDER — SODIUM CHLORIDE 0.9 % IV SOLN
INTRAVENOUS | Status: DC
  Administered 2020-07-03: 1000 mL via INTRAVENOUS

## 2020-07-03 MED ORDER — ONDANSETRON HCL 4 MG/2ML IJ SOLN
INTRAMUSCULAR | Status: AC
  Filled 2020-07-03: qty 2

## 2020-07-03 NOTE — Op Note (Signed)
[facility] Patient Name: Alisha [name] Procedure Date: 07/03/2020 9:42 AM MRN: [id number] Date of Birth: [birth year] Attending MD: Norvel Richards , MD CSN: [id number] Age: 69 Admit Type: Outpatient Procedure:                Upper GI endoscopy Indications:              Surveillance procedure, Esophageal varices Providers:                Norvel Richards, MD, Lurline Del, RN, Dereck Leep, Technician, Randa Spike,                            Technician Referring MD:              Medicines:                Midazolam 4 mg IV, Meperidine 25 mg IV Complications:            No immediate complications. Estimated Blood Loss:     Estimated blood loss: none. Procedure:                After obtaining informed consent, the endoscope was                            passed under direct vision. Throughout the                            procedure, the patient's blood pressure, pulse, and                            oxygen saturations were monitored continuously. The                            GIF-H190 ([id number]) scope was introduced through the                            mouth, and advanced to the second part of duodenum. Scope In: 10:40:39 AM Scope Out: 10:43:35 AM Total Procedure Duration: 0 hours 2 minutes 56 seconds  Findings:      No varices. Scar present from prior banding. 5 cm irregular "tongue" of       salmon-colored epithelium. No nodularity. No esophagitis.      Mild portal hypertensive gastropathy was found in the entire examined       stomach. Small hiatal hernia.      The duodenal bulb and second portion of the duodenum were normal. Impression:               -No esophageal varices. Barrett's esophagus. Portal                            hypertensive gastropathy.                           - Normal duodenal bulb and second portion of the  duodenum.                           - No specimens  collected. Moderate Sedation:      Moderate (conscious) sedation was administered by the endoscopy nurse       and supervised by the endoscopist. The following parameters were       monitored: oxygen saturation, heart rate, blood pressure, respiratory       rate, EKG, adequacy of pulmonary ventilation, and response to care.       Total physician intraservice time was 12 minutes. Recommendation:           - Patient has a contact number available for                            emergencies. The signs and symptoms of potential                            delayed complications were discussed with the                            patient. Return to normal activities tomorrow.                            Written discharge instructions were provided to the                            patient.                           - Advance diet as tolerated.                           - Continue present medications.                           - Repeat upper endoscopy in 1.5 years for                            surveillance.                           - Return to GI clinic in 4 months. Keep appointment                            for a hepatic MRI first of the year. Procedure Code(s):        --- Professional ---                           [phone], Moderate sedation services provided by the                            same physician or other qualified health care                            professional performing the diagnostic or  therapeutic service that the sedation supports,                            requiring the presence of an independent trained                            observer to assist in the monitoring of the                            patient's level of consciousness and physiological                            status; initial 15 minutes of intraservice time,                            patient age 22 years or older Diagnosis Code(s):        --- Professional ---                            K76.6, Portal hypertension                           K31.89, Other diseases of stomach and duodenum                           I85.00, Esophageal varices without bleeding CPT copyright 2019 [employer]. All rights reserved. The codes documented in this report are preliminary and upon coder review may  be revised to meet current compliance requirements. Cristopher Estimable. Antigone Crowell, MD Norvel Richards, MD 07/03/2020 10:59:01 AM This report has been signed electronically. Number of Addenda: 0

## 2020-07-03 NOTE — Discharge Instructions (Signed)
EGD Discharge instructions Please read the instructions outlined below and refer to this sheet in the next few weeks. These discharge instructions provide you with general information on caring for yourself after you leave the hospital. Your doctor may also give you specific instructions. While your treatment has been planned according to the most current medical practices available, unavoidable complications occasionally occur. If you have any problems or questions after discharge, please call your doctor. ACTIVITY  You may resume your regular activity but move at a slower pace for the next 24 hours.   Take frequent rest periods for the next 24 hours.   Walking will help expel (get rid of) the air and reduce the bloated feeling in your abdomen.   No driving for 24 hours (because of the anesthesia (medicine) used during the test).   You may shower.   Do not sign any important legal documents or operate any machinery for 24 hours (because of the anesthesia used during the test).  NUTRITION  Drink plenty of fluids.   You may resume your normal diet.   Begin with a light meal and progress to your normal diet.   Avoid alcoholic beverages for 24 hours or as instructed by your caregiver.  MEDICATIONS  You may resume your normal medications unless your caregiver tells you otherwise.  WHAT YOU CAN EXPECT TODAY  You may experience abdominal discomfort such as a feeling of fullness or "gas" pains.  FOLLOW-UP  Your doctor will discuss the results of your test with you.  SEEK IMMEDIATE MEDICAL ATTENTION IF ANY OF THE FOLLOWING OCCUR:  Excessive nausea (feeling sick to your stomach) and/or vomiting.   Severe abdominal pain and distention (swelling).   Trouble swallowing.   Temperature over 101 F (37.8 C).   Rectal bleeding or vomiting of blood.    No esophageal varices today.  Esophagus looked the same as it did last year.  No biopsies taken today per plan  Recommend repeat  EGD in 12 to 18 months -as directed by [place]  Keep appointment for MRI the first of the year  Office visit with [name] in 4 months from now    At patient request, I called [facility] [phone] -reviewed results

## 2020-07-03 NOTE — H&P (Signed)
@LOGO @   Primary Care Physician:  Glenda Chroman, MD Primary Gastroenterologist:  Dr. Gala Romney  Pre-Procedure History & Physical: HPI:  Alisha [name] is a 69 y.o. female here for first surveillance EGD.  History of esophageal varices with bleeding previously banded.  No varices seen last year.  Cord segment Barrett's esophagus biopsied last year.  No dysplasia.  Patient has no upper GI tract symptoms at this time.  She remains on nonselective beta-blocker therapy no dysphagia.  On Protonix 40 mg twice daily.  Suspicious liver lesion under close surveillance at [facility]  MRI planned for the first of the year. It is notable that large gastroesophageal varices were noted on MRI May of this year.  Past Medical History:  Diagnosis Date  . B12 deficiency 11/03/2016  . B12 deficiency 11/03/2016  . Barrett's esophagus   . Cirrhosis of liver without mention of alcohol    hep B surface antigen and HCV ab negative.pt has not had hepatitis A and B vaccines.U/S on 07/04/13 shows cirrhosis.  . Diverticula of colon    pancolonic  . Esophageal reflux   . Esophageal varices ([place])   . Hiatal hernia   . Iron deficiency anemia, unspecified   . Other and unspecified hyperlipidemia   . Portal hypertensive gastropathy ([place])   . Tubular adenoma   . Type II or unspecified type diabetes mellitus without mention of complication, not stated as uncontrolled   . Unspecified essential hypertension   . Unspecified hemorrhoids without mention of complication     Past Surgical History:  Procedure Laterality Date  . ABDOMINAL HYSTERECTOMY    . BIOPSY  04/30/2016   Procedure: BIOPSY;  Surgeon: Daneil Dolin, MD;  [facility];  Service: Endoscopy;;  esophagus  . BIOPSY  02/24/2019   Procedure: BIOPSY;  Surgeon: Daneil Dolin, MD;  [facility];  Service: Endoscopy;;  . CESAREAN SECTION     x 2  . COLONOSCOPY  03/2006   left sided diverticula, splenic flexure tublar adenoma  . COLONOSCOPY   07/2002   villous tubular adenoma in rectum  . COLONOSCOPY  09/23/09   external hemorrhoids/scattered pan colonic diverticula otherwise normal. cecal lipoma bx negative, normal TI. Next TCS 09/2014  . COLONOSCOPY N/A 11/12/2014   Procedure: COLONOSCOPY;  Surgeon: Daneil Dolin, MD;  [facility];  Service: Endoscopy;  Laterality: N/A;  1215 - moved to 12:30 - Ginger to notify pt  . COLONOSCOPY N/A 01/07/2017   Emmabelle Fear: Grade 4 internal hemorrhoids, 5 mm ulcer in the sigmoid colon (benign bx), 7 mm polyp removed from the cecum (tubular adenoma), diverticulosis.next tcs in five years  . ESOPHAGEAL BANDING N/A 12/20/2016   Procedure: ESOPHAGEAL BANDING;  Surgeon: Danie Binder, MD;  [facility];  Service: Endoscopy;  Laterality: N/A;  . ESOPHAGEAL BANDING N/A 03/18/2017   Procedure: ESOPHAGEAL BANDING;  Surgeon: Daneil Dolin, MD;  [facility];  Service: Endoscopy;  Laterality: N/A;  . ESOPHAGEAL BANDING  12/17/2017   Procedure: ESOPHAGEAL BANDING;  Surgeon: Daneil Dolin, MD;  [facility];  Service: Endoscopy;;  . ESOPHAGOGASTRODUODENOSCOPY  08/2006   barretts esophagus, no dyplasia  . ESOPHAGOGASTRODUODENOSCOPY  03/2006   barretts esophagus,bx focal atypia c/w low grade dysplasia, SB bx negative for celiac  . ESOPHAGOGASTRODUODENOSCOPY  09/23/09   3 columns of grade 2 esophageal varices/hiatal hernia/3-4 cm segment Barrett's esophagus without dysplasia. Next EGD 09/2012  . ESOPHAGOGASTRODUODENOSCOPY N/A 11/11/2012   Dr.  March Joos- Barrett;s esophagus, esophageal varices, portal gastopathy. hiatal hernia  . ESOPHAGOGASTRODUODENOSCOPY N/A 04/30/2016   Procedure: ESOPHAGOGASTRODUODENOSCOPY (EGD);  Surgeon: Daneil Dolin, MD;  [facility];  Service: Endoscopy;  Laterality: N/A;  815  . ESOPHAGOGASTRODUODENOSCOPY N/A 12/20/2016   Procedure: ESOPHAGOGASTRODUODENOSCOPY (EGD);  Surgeon: Danie Binder, MD;  [facility];  Service: Endoscopy;   Laterality: N/A;  . ESOPHAGOGASTRODUODENOSCOPY N/A 01/07/2017   Procedure: ESOPHAGOGASTRODUODENOSCOPY (EGD);  Surgeon: Daneil Dolin, MD;  [facility];  Service: Endoscopy;  Laterality: N/A;  . ESOPHAGOGASTRODUODENOSCOPY N/A 03/18/2017   Procedure: ESOPHAGOGASTRODUODENOSCOPY (EGD);  Surgeon: Daneil Dolin, MD;  [facility];  Service: Endoscopy;  Laterality: N/A;  1030   . ESOPHAGOGASTRODUODENOSCOPY N/A 12/17/2017   Procedure: ESOPHAGOGASTRODUODENOSCOPY (EGD);  Surgeon: Daneil Dolin, MD;  [facility];  Service: Endoscopy;  Laterality: N/A;  10:30am  . ESOPHAGOGASTRODUODENOSCOPY N/A 02/24/2019   Cevin Rubinstein: No esophageal varices seen, scars noted from prior banding.  5 cm segment of salmon-colored epithelium coming up from the GE junction consistent with Barrett's (bx with no dysplasia).  Portal gastropathy.  Small hiatal hernia.  Recommended repeat EGD in 18 months.  Marland Kitchen Hessmer  2008  . small bowel capsule endoscopy  10/2009   normal    Prior to Admission medications   Medication Sig Start Date End Date Taking? Authorizing Provider  Cholecalciferol (VITAMIN D) 2000 units tablet Take 2,000 Units by mouth daily.   Yes [provider]  cyanocobalamin 1000 MCG tablet Take 1,000 mcg by mouth daily.    Yes [provider]  glipiZIDE (GLUCOTROL XL) 5 MG 24 hr tablet Take 1 tablet (5 mg total) by mouth daily with breakfast. 06/11/20  Yes Nida, Marella Chimes, MD  lisinopril (PRINIVIL,ZESTRIL) 10 MG tablet Take 10 mg by mouth daily.  04/09/12  Yes [provider]  pantoprazole (PROTONIX) 40 MG tablet Take 1 tablet (40 mg total) by mouth 2 (two) times daily before a meal. 12/24/16  Yes Orvan Falconer, MD  propranolol (INDERAL) 10 MG tablet TAKE THREE (3) TABLETS BY MOUTH EVERY MORNING AND 3 TABLETS EVERY EVENING. Patient taking differently: Take 30 mg by mouth 2 (two) times daily.  02/15/20  Yes Annitta Needs, NP  TRESIBA FLEXTOUCH 200 UNIT/ML  SOPN Inject 50 Units into the skin at bedtime. 04/08/17  Yes [provider]  VICTOZA 18 MG/3ML SOLN Inject 1.8 mg into the skin daily.  04/15/12  Yes [provider]  Accu-Chek Softclix Lancets lancets USE TO CHECK BLOOD SUGAR TWICE DAILY. 06/18/20   [name], MD  NOVOFINE 32G X 6 MM MISC USE ONE AS DIRECTED TWICE DAILY. 09/15/18   [provider]  ULTICARE PEN NEEDLES 31G X 5 MM MISC USE ONE AS DIRECTED TWICE DAILY. 08/15/19   [provider]    Allergies as of 05/13/2020 - Review Complete 05/07/2020  Allergen Reaction Noted  . Codeine Nausea And Vomiting 04/19/2012    Family History  Problem Relation Age of Onset  . Alzheimer's disease Mother   . CAD Father   . Diabetes Mellitus II Father   . Alzheimer's disease Father   . Diabetes Mellitus II Sister   . Colon cancer Sister 30       small, surgical excision; chemo/rad not needed  . Cancer - Other Brother   . Diabetes Mellitus II Brother   . Hypertension Brother   . Liver disease Neg Hx     Social History  Socioeconomic History  . Marital status: Married    Spouse name: Not on file  . Number of children: Not on file  . Years of education: Not on file  . Highest education level: Not on file  Occupational History  . Not on file  Tobacco Use  . Smoking status: Never Smoker  . Smokeless tobacco: Never Used  Vaping Use  . Vaping Use: Never used  Substance and Sexual Activity  . Alcohol use: No  . Drug use: No  . Sexual activity: Yes  Other Topics Concern  . Not on file  Social History Narrative  . Not on file   Social Determinants of Health   Financial Resource Strain:   . Difficulty of Paying Living Expenses: Not on file  Food Insecurity:   . Worried About Charity fundraiser in the Last Year: Not on file  . Ran Out of Food in the Last Year: Not on file  Transportation Needs:   . Lack of Transportation (Medical): Not on file  . Lack of Transportation (Non-Medical):  Not on file  Physical Activity:   . Days of Exercise per Week: Not on file  . Minutes of Exercise per Session: Not on file  Stress:   . Feeling of Stress : Not on file  Social Connections:   . Frequency of Communication with Friends and Family: Not on file  . Frequency of Social Gatherings with Friends and Family: Not on file  . Attends Religious Services: Not on file  . Active Member of Clubs or Organizations: Not on file  . Attends Archivist Meetings: Not on file  . Marital Status: Not on file  Intimate Partner Violence:   . Fear of Current or Ex-Partner: Not on file  . Emotionally Abused: Not on file  . Physically Abused: Not on file  . Sexually Abused: Not on file    Review of Systems: See HPI, otherwise negative ROS  Physical Exam: BP (!) 160/68   Pulse 69   Temp 97.9 F (36.6 C) (Oral)   Resp 13   Ht 5' 1"  (1.549 m)   Wt 87.1 kg   SpO2 99%   BMI 36.28 kg/m  General:   Alert,  Well-developed, well-nourished, pleasant and cooperative in NAD Neck:  Supple; no masses or thyromegaly. No significant cervical adenopathy. Lungs:  Clear throughout to auscultation.   No wheezes, crackles, or rhonchi. No acute distress. Heart:  Regular rate and rhythm; no murmurs, clicks, rubs,  or gallops. Abdomen: Non-distended, normal bowel sounds.  Soft and nontender without appreciable mass or hepatosplenomegaly.  Pulses:  Normal pulses noted. Extremities:  Without clubbing or edema.  Impression/Plan: 69 year old lady with [name] cirrhosis here for surveillance EGD.  History of short segment Barrett's esophagus with no dysplasia seen on biopsies last year.  I told the patient today that unless there is nodularity or other abnormality involving the Barrett's epithelium, I would defer biopsy to closer to a 3 year interval per guidelines -particularly in view of a large GE junction varices seen on MRI earlier this year.  In addition, patient has a significant recurrent varices today  I would plan to perform esophageal band ligation.  Risk and benefits of EGD otherwise have been reviewed with the patient.  Questions have been answered.  Patient is agreeable.  Plan for EGD this morning.  Further recommendations to follow.   Notice: This dictation was prepared with Dragon dictation along with smaller phrase technology. Any transcriptional errors that result  from this process are unintentional and may not be corrected upon review.

## 2020-07-09 ENCOUNTER — Encounter (HOSPITAL_COMMUNITY): Payer: Self-pay | Admitting: Internal Medicine

## 2020-07-13 DIAGNOSIS — E1165 Type 2 diabetes mellitus with hyperglycemia: Secondary | ICD-10-CM | POA: Diagnosis not present

## 2020-07-15 ENCOUNTER — Telehealth: Payer: Self-pay | Admitting: Internal Medicine

## 2020-07-15 NOTE — Telephone Encounter (Signed)
Recall for mri/mrcp

## 2020-07-16 NOTE — Telephone Encounter (Signed)
See [facility] OV note. Patient is following [facility]

## 2020-07-29 ENCOUNTER — Other Ambulatory Visit: Payer: Self-pay

## 2020-07-29 DIAGNOSIS — IMO0002 Reserved for concepts with insufficient information to code with codable children: Secondary | ICD-10-CM

## 2020-07-29 DIAGNOSIS — N183 Chronic kidney disease, stage 3 unspecified: Secondary | ICD-10-CM

## 2020-07-30 ENCOUNTER — Ambulatory Visit: Payer: Medicare PPO | Admitting: Nutrition

## 2020-08-13 DIAGNOSIS — E1165 Type 2 diabetes mellitus with hyperglycemia: Secondary | ICD-10-CM | POA: Diagnosis not present

## 2020-08-17 ENCOUNTER — Other Ambulatory Visit: Payer: Self-pay | Admitting: Gastroenterology

## 2020-08-21 ENCOUNTER — Encounter: Payer: Self-pay | Admitting: "Endocrinology

## 2020-08-21 ENCOUNTER — Other Ambulatory Visit: Payer: Self-pay

## 2020-08-21 ENCOUNTER — Ambulatory Visit: Payer: Medicare PPO | Admitting: "Endocrinology

## 2020-08-21 VITALS — BP 130/68 | HR 68 | Ht 61.0 in | Wt 197.0 lb

## 2020-08-21 DIAGNOSIS — E559 Vitamin D deficiency, unspecified: Secondary | ICD-10-CM

## 2020-08-21 DIAGNOSIS — I10 Essential (primary) hypertension: Secondary | ICD-10-CM | POA: Diagnosis not present

## 2020-08-21 DIAGNOSIS — N183 Chronic kidney disease, stage 3 unspecified: Secondary | ICD-10-CM

## 2020-08-21 DIAGNOSIS — E1165 Type 2 diabetes mellitus with hyperglycemia: Secondary | ICD-10-CM | POA: Diagnosis not present

## 2020-08-21 DIAGNOSIS — E1122 Type 2 diabetes mellitus with diabetic chronic kidney disease: Secondary | ICD-10-CM

## 2020-08-21 DIAGNOSIS — IMO0002 Reserved for concepts with insufficient information to code with codable children: Secondary | ICD-10-CM

## 2020-08-21 LAB — POCT GLYCOSYLATED HEMOGLOBIN (HGB A1C): HbA1c, POC (controlled diabetic range): 8.5 % — AB (ref 0.0–7.0)

## 2020-08-21 NOTE — Patient Instructions (Signed)

## 2020-08-21 NOTE — Progress Notes (Signed)
08/21/2020, 2:18 PM  Endocrinology follow-up note   Subjective:    Patient ID: Alisha [name], female    DOB: Dec 27, 1950.  Alisha [name] is being seen in follow-up after she was seen in consultation for management of currently uncontrolled symptomatic diabetes requested by  Glenda Chroman, MD.   Past Medical History:  Diagnosis Date  . B12 deficiency 11/03/2016  . B12 deficiency 11/03/2016  . Barrett's esophagus   . Cirrhosis of liver without mention of alcohol    hep B surface antigen and HCV ab negative.pt has not had hepatitis A and B vaccines.U/S on 07/04/13 shows cirrhosis.  . Diverticula of colon    pancolonic  . Esophageal reflux   . Esophageal varices ([place])   . Hiatal hernia   . Iron deficiency anemia, unspecified   . Other and unspecified hyperlipidemia   . Portal hypertensive gastropathy ([place])   . Tubular adenoma   . Type II or unspecified type diabetes mellitus without mention of complication, not stated as uncontrolled   . Unspecified essential hypertension   . Unspecified hemorrhoids without mention of complication     Past Surgical History:  Procedure Laterality Date  . ABDOMINAL HYSTERECTOMY    . BIOPSY  04/30/2016   Procedure: BIOPSY;  Surgeon: Daneil Dolin, MD;  [facility];  Service: Endoscopy;;  esophagus  . BIOPSY  02/24/2019   Procedure: BIOPSY;  Surgeon: Daneil Dolin, MD;  [facility];  Service: Endoscopy;;  . CESAREAN SECTION     x 2  . COLONOSCOPY  03/2006   left sided diverticula, splenic flexure tublar adenoma  . COLONOSCOPY  07/2002   villous tubular adenoma in rectum  . COLONOSCOPY  09/23/09   external hemorrhoids/scattered pan colonic diverticula otherwise normal. cecal lipoma bx negative, normal TI. Next TCS 09/2014  . COLONOSCOPY N/A 11/12/2014   Procedure: COLONOSCOPY;  Surgeon: Daneil Dolin, MD;  [facility];   Service: Endoscopy;  Laterality: N/A;  1215 - moved to 12:30 - Ginger to notify pt  . COLONOSCOPY N/A 01/07/2017   Rourk: Grade 4 internal hemorrhoids, 5 mm ulcer in the sigmoid colon (benign bx), 7 mm polyp removed from the cecum (tubular adenoma), diverticulosis.next tcs in five years  . ESOPHAGEAL BANDING N/A 12/20/2016   Procedure: ESOPHAGEAL BANDING;  Surgeon: Danie Binder, MD;  [facility];  Service: Endoscopy;  Laterality: N/A;  . ESOPHAGEAL BANDING N/A 03/18/2017   Procedure: ESOPHAGEAL BANDING;  Surgeon: Daneil Dolin, MD;  [facility];  Service: Endoscopy;  Laterality: N/A;  . ESOPHAGEAL BANDING  12/17/2017   Procedure: ESOPHAGEAL BANDING;  Surgeon: Daneil Dolin, MD;  [facility];  Service: Endoscopy;;  . ESOPHAGEAL BANDING N/A 07/03/2020   Procedure: ESOPHAGEAL BANDING;  Surgeon: Daneil Dolin, MD;  [facility];  Service: Endoscopy;  Laterality: N/A;  . ESOPHAGOGASTRODUODENOSCOPY  08/2006   barretts esophagus, no dyplasia  . ESOPHAGOGASTRODUODENOSCOPY  03/2006   barretts esophagus,bx focal atypia c/w low grade dysplasia, SB bx negative for celiac  . ESOPHAGOGASTRODUODENOSCOPY  09/23/09   3 columns of grade 2 esophageal varices/hiatal  hernia/3-4 cm segment Barrett's esophagus without dysplasia. Next EGD 09/2012  . ESOPHAGOGASTRODUODENOSCOPY N/A 11/11/2012   Dr. Gala Romney- Barrett;s esophagus, esophageal varices, portal gastopathy. hiatal hernia  . ESOPHAGOGASTRODUODENOSCOPY N/A 04/30/2016   Procedure: ESOPHAGOGASTRODUODENOSCOPY (EGD);  Surgeon: Daneil Dolin, MD;  [facility];  Service: Endoscopy;  Laterality: N/A;  815  . ESOPHAGOGASTRODUODENOSCOPY N/A 12/20/2016   Procedure: ESOPHAGOGASTRODUODENOSCOPY (EGD);  Surgeon: Danie Binder, MD;  [facility];  Service: Endoscopy;  Laterality: N/A;  . ESOPHAGOGASTRODUODENOSCOPY N/A 01/07/2017   Procedure: ESOPHAGOGASTRODUODENOSCOPY (EGD);  Surgeon: Daneil Dolin, MD;   [facility];  Service: Endoscopy;  Laterality: N/A;  . ESOPHAGOGASTRODUODENOSCOPY N/A 03/18/2017   Procedure: ESOPHAGOGASTRODUODENOSCOPY (EGD);  Surgeon: Daneil Dolin, MD;  [facility];  Service: Endoscopy;  Laterality: N/A;  1030   . ESOPHAGOGASTRODUODENOSCOPY N/A 12/17/2017   Procedure: ESOPHAGOGASTRODUODENOSCOPY (EGD);  Surgeon: Daneil Dolin, MD;  [facility];  Service: Endoscopy;  Laterality: N/A;  10:30am  . ESOPHAGOGASTRODUODENOSCOPY N/A 02/24/2019   Rourk: No esophageal varices seen, scars noted from prior banding.  5 cm segment of salmon-colored epithelium coming up from the GE junction consistent with Barrett's (bx with no dysplasia).  Portal gastropathy.  Small hiatal hernia.  Recommended repeat EGD in 18 months.  . ESOPHAGOGASTRODUODENOSCOPY N/A 07/03/2020   Procedure: ESOPHAGOGASTRODUODENOSCOPY (EGD);  Surgeon: Daneil Dolin, MD;  [facility];  Service: Endoscopy;  Laterality: N/A;  10:15am  . HEMORRHOID SURGERY  2008  . small bowel capsule endoscopy  10/2009   normal    Social History   Socioeconomic History  . Marital status: Married    Spouse name: Not on file  . Number of children: Not on file  . Years of education: Not on file  . Highest education level: Not on file  Occupational History  . Not on file  Tobacco Use  . Smoking status: Never Smoker  . Smokeless tobacco: Never Used  Vaping Use  . Vaping Use: Never used  Substance and Sexual Activity  . Alcohol use: No  . Drug use: No  . Sexual activity: Yes  Other Topics Concern  . Not on file  Social History Narrative  . Not on file   Social Determinants of Health   Financial Resource Strain:   . Difficulty of Paying Living Expenses: Not on file  Food Insecurity:   . Worried About Charity fundraiser in the Last Year: Not on file  . Ran Out of Food in the Last Year: Not on file  Transportation Needs:   . Lack of Transportation (Medical): Not on file  .  Lack of Transportation (Non-Medical): Not on file  Physical Activity:   . Days of Exercise per Week: Not on file  . Minutes of Exercise per Session: Not on file  Stress:   . Feeling of Stress : Not on file  Social Connections:   . Frequency of Communication with Friends and Family: Not on file  . Frequency of Social Gatherings with Friends and Family: Not on file  . Attends Religious Services: Not on file  . Active Member of Clubs or Organizations: Not on file  . Attends Archivist Meetings: Not on file  . Marital Status: Not on file    Family History  Problem Relation Age of Onset  . Alzheimer's disease Mother   . CAD Father   . Diabetes Mellitus II Father   . Alzheimer's disease Father   . Diabetes Mellitus  II Sister   . Colon cancer Sister 90       small, surgical excision; chemo/rad not needed  . Cancer - Other Brother   . Diabetes Mellitus II Brother   . Hypertension Brother   . Liver disease Neg Hx     Outpatient Encounter Medications as of 08/21/2020  Medication Sig  . Accu-Chek Softclix Lancets lancets USE TO CHECK BLOOD SUGAR TWICE DAILY.  Marland Kitchen Cholecalciferol (VITAMIN D) 2000 units tablet Take 2,000 Units by mouth daily.  . cyanocobalamin 1000 MCG tablet Take 1,000 mcg by mouth daily.   Marland Kitchen glipiZIDE (GLUCOTROL XL) 5 MG 24 hr tablet Take 1 tablet (5 mg total) by mouth daily with breakfast.  . lisinopril (PRINIVIL,ZESTRIL) 10 MG tablet Take 10 mg by mouth daily.   Marland Kitchen NOVOFINE 32G X 6 MM MISC USE ONE AS DIRECTED TWICE DAILY.  . pantoprazole (PROTONIX) 40 MG tablet Take 1 tablet (40 mg total) by mouth 2 (two) times daily before a meal.  . propranolol (INDERAL) 10 MG tablet TAKE THREE (3) TABLETS BY MOUTH EVERY MORNING AND 3 TABLETS EVERY EVENING. (Patient taking differently: Take 30 mg by mouth 2 (two) times daily. )  . TRESIBA FLEXTOUCH 200 UNIT/ML SOPN Inject 60 Units into the skin at bedtime.  Marland Kitchen ULTICARE PEN NEEDLES 31G X 5 MM MISC USE ONE AS DIRECTED TWICE  DAILY.  Marland Kitchen VICTOZA 18 MG/3ML SOLN Inject 1.8 mg into the skin daily.    No facility-administered encounter medications on file as of 08/21/2020.    ALLERGIES: Allergies  Allergen Reactions  . Codeine Nausea And Vomiting    VACCINATION STATUS:  There is no immunization history on file for this patient.  Diabetes She presents for her follow-up diabetic visit. She has type 2 diabetes mellitus. Onset time: She was diagnosed at approximate age of 17 years. Her disease course has been worsening. There are no hypoglycemic associated symptoms. Pertinent negatives for hypoglycemia include no confusion, headaches, pallor or seizures. Pertinent negatives for diabetes include no chest pain, no fatigue, no polydipsia, no polyphagia and no polyuria. (She did not bring any logs nor meter to review.  Patient verbally reports that she is having fluctuating glycemic profile including hypoglycemia.) There are no hypoglycemic complications. Symptoms are worsening. Diabetic complications include nephropathy. Risk factors for coronary artery disease include diabetes mellitus, hypertension, obesity, post-menopausal and family history. Current diabetic treatments: She is currently on Tresiba 52 units nightly, Victoza 1.8 mg subcutaneously daily, glipizide 10 mg daily. Her weight is decreasing steadily. She is following a generally unhealthy diet. When asked about meal planning, she reported none. She has had a previous visit with a dietitian. She participates in exercise intermittently. Her home blood glucose trend is decreasing steadily. Her breakfast blood glucose range is generally 140-180 mg/dl. Her lunch blood glucose range is generally 140-180 mg/dl. Her dinner blood glucose range is generally 140-180 mg/dl. Her bedtime blood glucose range is generally 140-180 mg/dl. Her overall blood glucose range is 140-180 mg/dl. (She presents with improved glycemic profile, point-of-care A1c of 8.5%, overall improving from 10+  percent.  No hypoglycemia. ) An ACE inhibitor/angiotensin II receptor blocker is being taken. Eye exam is current.  Hypertension This is a chronic problem. The current episode started more than 1 year ago. The problem is controlled. Pertinent negatives include no chest pain, headaches, palpitations or shortness of breath. Risk factors for coronary artery disease include diabetes mellitus. Past treatments include ACE inhibitors. Hypertensive end-organ damage includes kidney disease. Identifiable causes of  hypertension include chronic renal disease.     Review of Systems  Constitutional: Negative for chills, fatigue, fever and unexpected weight change.  HENT: Negative for trouble swallowing and voice change.   Eyes: Negative for visual disturbance.  Respiratory: Negative for cough, shortness of breath and wheezing.   Cardiovascular: Negative for chest pain, palpitations and leg swelling.  Gastrointestinal: Negative for diarrhea, nausea and vomiting.  Endocrine: Negative for cold intolerance, heat intolerance, polydipsia, polyphagia and polyuria.  Musculoskeletal: Negative for arthralgias and myalgias.  Skin: Negative for color change, pallor, rash and wound.  Neurological: Negative for seizures and headaches.  Psychiatric/Behavioral: Negative for confusion and suicidal ideas.    Objective:    Vitals with BMI 08/21/2020 07/03/2020 07/03/2020  Height 5' 1"  - -  Weight 197 lbs - -  BMI 16.10 - -  Systolic 960 454 96  Diastolic 68 61 50  Pulse 68 76 64    BP 130/68   Pulse 68   Ht 5' 1"  (1.549 m)   Wt 197 lb (89.4 kg)   BMI 37.22 kg/m   Wt Readings from Last 3 Encounters:  08/21/20 197 lb (89.4 kg)  07/03/20 192 lb (87.1 kg)  06/18/20 192 lb (87.1 kg)     Physical Exam Constitutional:      Appearance: She is well-developed.  HENT:     Head: Normocephalic and atraumatic.  Neck:     Thyroid: No thyromegaly.     Trachea: No tracheal deviation.  Cardiovascular:     Rate and  Rhythm: Normal rate and regular rhythm.  Pulmonary:     Effort: Pulmonary effort is normal.  Abdominal:     Tenderness: There is no abdominal tenderness. There is no guarding.  Musculoskeletal:        General: Normal range of motion.     Cervical back: Normal range of motion and neck supple.     Comments: Her foot exam was normal today.  Skin:    General: Skin is warm and dry.     Coloration: Skin is not pale.     Findings: No erythema or rash.  Neurological:     Mental Status: She is alert and oriented to person, place, and time.     Cranial Nerves: No cranial nerve deficit.     Coordination: Coordination normal.     Deep Tendon Reflexes: Reflexes are normal and symmetric.  Psychiatric:        Judgment: Judgment normal.       CMP ( most recent) CMP     Component Value Date/Time   NA 135 05/29/2020 1416   K 4.4 05/29/2020 1416   CL 106 05/29/2020 1416   CO2 23 05/29/2020 1416   GLUCOSE 236 (H) 05/29/2020 1416   BUN 21 05/29/2020 1416   BUN 15 01/25/2020 0000   CREATININE 1.68 (H) 05/29/2020 1416   CREATININE 1.62 (H) 03/28/2018 1146   CALCIUM 9.0 05/29/2020 1416   CALCIUM 9.1 01/25/2020 1059   PROT 6.6 05/06/2020 1107   ALBUMIN 3.6 05/29/2020 1416   AST 28 05/06/2020 1107   ALT 16 05/06/2020 1107   ALKPHOS 76 05/06/2020 1107   BILITOT 3.2 (H) 05/06/2020 1107   GFRNONAA 31 (L) 05/29/2020 1416   GFRAA 36 (L) 05/29/2020 1416     Diabetic Labs (most recent): Lab Results  Component Value Date   HGBA1C 8.5 (A) 08/21/2020   HGBA1C 8.1 05/07/2020   HGBA1C 10.9 (H) 12/19/2016     Lipid Panel ( most recent)  Lipid Panel     Component Value Date/Time   CHOL 144 01/25/2020 0000   TRIG 239 (A) 01/25/2020 0000   HDL 28 (A) 01/25/2020 0000   LDLCALC 67 01/25/2020 0000      Lab Results  Component Value Date   TSH 2.46 01/25/2020      Assessment & Plan:   1. Uncontrolled type 2 diabetes mellitus with stage 3 chronic kidney disease ([place])  - Alisha [name] has currently uncontrolled symptomatic type 2 DM since 69 years of age. She presents with improved glycemic profile, point-of-care A1c of 8.5%, overall improving from 10+ percent.  No hypoglycemia.  Recent labs reviewed. - I had a long discussion with her about the progressive nature of diabetes and the pathology behind its complications. -her diabetes is complicated by CKD and she remains at a high risk for more acute and chronic complications which include CAD, CVA, CKD, retinopathy, and neuropathy. These are all discussed in detail with her.  - I have counseled her on diet  and weight management  by adopting a carbohydrate restricted/protein rich diet. Patient is encouraged to switch to  unprocessed or minimally processed     complex starch and increased protein intake (animal or plant source), fruits, and vegetables. -  she is advised to stick to a routine mealtimes to eat 3 meals  a day and avoid unnecessary snacks ( to snack only to correct hypoglycemia).   - she  admits there is a room for improvement in her diet and drink choices. -  Suggestion is made for her to avoid simple carbohydrates  from her diet including Cakes, Sweet Desserts / Pastries, Ice Cream, Soda (diet and regular), Sweet Tea, Candies, Chips, Cookies, Sweet Pastries,  Store Bought Juices, Alcohol in Excess of  1-2 drinks a day, Artificial Sweeteners, Coffee Creamer, and "Sugar-free" Products. This will help patient to have stable blood glucose profile and potentially avoid unintended weight gain.   - she will be scheduled with Jearld Fenton, RDN, CDE for diabetes education.  - I have approached her with the following individualized plan to manage  her diabetes and patient agrees:   - she has required multiple daily injections of insulin to control diabetes in the past. -Based on her presentation with near target glycemic profile, she will not need prandial insulin for now.  I discussed and increased her Tyler Aas to  60 units nightly, advised to continue to monitor blood glucose at least twice a day-daily before breakfast and at bedtime until her next visit. - she is warned not to take insulin without proper monitoring per orders. - she is encouraged to call clinic for blood glucose levels less than 70 or above 200 mg /dl. - she is advised to continue Victoza 1.8 mg subcutaneously daily, therapeutically suitable for patient . -She has benefited from low-dose glipizide.  She is advised to continue glipizide 5 mg XL daily with breakfast.     - she is not a candidate for Metformin, SGLT2 inhibitors due to concurrent renal insufficiency.  - Specific targets for  A1c;  LDL, HDL,  and Triglycerides were discussed with the patient.  2) Blood Pressure /Hypertension: Her blood pressure is controlled to target.she is advised to continue her current medications including lisinopril 10 mg p.o. daily with breakfast .   3) Lipids/Hyperlipidemia:   Review of her recent lipid panel showed  controlled  LDL at 67 .  she  is not on statins.  She will be considered for  fasting lipid panel and low-dose statins on subsequent visits.    4)  Weight/Diet:  Body mass index is 37.22 kg/m.  -   clearly complicating her diabetes care.   she is  a candidate for weight loss. I discussed with her the fact that loss of 5 - 10% of her  current body weight will have the most impact on her diabetes management.  Exercise, and detailed carbohydrates information provided  -  detailed on discharge instructions.  5) Chronic Care/Health Maintenance:  -she  is on ACEI Statin medications and  is encouraged to initiate and continue to follow up with Ophthalmology, Dentist,  Podiatrist at least yearly or according to recommendations, and advised to   stay away from smoking. I have recommended yearly flu vaccine and pneumonia vaccine at least every 5 years; moderate intensity exercise for up to 150 minutes weekly; and  sleep for at least 7 hours a  day.  6.  Vitamin D deficiency: She is advised to continue cholecalciferol 2000 units daily.  She recently had normal ABI, will be repeated in 5 years.  - she is  advised to maintain close follow up with Glenda Chroman, MD for primary care needs, as well as her other providers for optimal and coordinated care.   - Time spent on this patient care encounter:  35 min, of which > 50% was spent in  counseling and the rest reviewing her blood glucose logs , discussing her hypoglycemia and hyperglycemia episodes, reviewing her current and  previous labs / studies  ( including abstraction from other facilities) and medications  doses and developing a  long term treatment plan and documenting her care.   Please refer to Patient Instructions for Blood Glucose Monitoring and Insulin/Medications Dosing Guide"  in media tab for additional information. Please  also refer to " Patient Self Inventory" in the Media  tab for reviewed elements of pertinent patient history.  Alisha [name] participated in the discussions, expressed understanding, and voiced agreement with the above plans.  All questions were answered to her satisfaction. she is encouraged to contact clinic should she have any questions or concerns prior to her return visit.   Follow up plan: - Return in about 3 months (around 11/19/2020) for F/U with Pre-visit Labs, Meter, Logs, A1c here.Glade Lloyd, MD [facility] [address] [place], [place] [address] Phone: [phone]  Fax: [phone]    08/21/2020, 2:18 PM  This note was partially dictated with voice recognition software. Similar sounding words can be transcribed inadequately or may not  be corrected upon review.

## 2020-08-24 DIAGNOSIS — R911 Solitary pulmonary nodule: Secondary | ICD-10-CM | POA: Diagnosis not present

## 2020-08-24 DIAGNOSIS — I8500 Esophageal varices without bleeding: Secondary | ICD-10-CM | POA: Diagnosis not present

## 2020-08-24 DIAGNOSIS — K7689 Other specified diseases of liver: Secondary | ICD-10-CM | POA: Diagnosis not present

## 2020-08-24 DIAGNOSIS — K766 Portal hypertension: Secondary | ICD-10-CM | POA: Diagnosis not present

## 2020-08-24 DIAGNOSIS — K7460 Unspecified cirrhosis of liver: Secondary | ICD-10-CM | POA: Diagnosis not present

## 2020-08-24 DIAGNOSIS — I8510 Secondary esophageal varices without bleeding: Secondary | ICD-10-CM | POA: Diagnosis not present

## 2020-08-26 ENCOUNTER — Other Ambulatory Visit (HOSPITAL_COMMUNITY): Payer: Self-pay | Admitting: Surgery

## 2020-08-26 DIAGNOSIS — E538 Deficiency of other specified B group vitamins: Secondary | ICD-10-CM

## 2020-08-26 DIAGNOSIS — D500 Iron deficiency anemia secondary to blood loss (chronic): Secondary | ICD-10-CM

## 2020-08-27 ENCOUNTER — Other Ambulatory Visit (HOSPITAL_COMMUNITY)
Admission: RE | Admit: 2020-08-27 | Discharge: 2020-08-27 | Disposition: A | Discharge status: Home / Self Care | Payer: Medicare PPO | Source: Ambulatory Visit | Attending: Nephrology | Admitting: Nephrology

## 2020-08-27 ENCOUNTER — Inpatient Hospital Stay (HOSPITAL_COMMUNITY): Payer: Medicare PPO | Attending: Hematology

## 2020-08-27 ENCOUNTER — Other Ambulatory Visit: Payer: Self-pay

## 2020-08-27 DIAGNOSIS — Z800 Family history of malignant neoplasm of digestive organs: Secondary | ICD-10-CM | POA: Insufficient documentation

## 2020-08-27 DIAGNOSIS — E785 Hyperlipidemia, unspecified: Secondary | ICD-10-CM | POA: Diagnosis not present

## 2020-08-27 DIAGNOSIS — K2270 Barrett's esophagus without dysplasia: Secondary | ICD-10-CM | POA: Diagnosis not present

## 2020-08-27 DIAGNOSIS — N189 Chronic kidney disease, unspecified: Secondary | ICD-10-CM | POA: Insufficient documentation

## 2020-08-27 DIAGNOSIS — D500 Iron deficiency anemia secondary to blood loss (chronic): Secondary | ICD-10-CM

## 2020-08-27 DIAGNOSIS — K7460 Unspecified cirrhosis of liver: Secondary | ICD-10-CM | POA: Insufficient documentation

## 2020-08-27 DIAGNOSIS — I10 Essential (primary) hypertension: Secondary | ICD-10-CM | POA: Insufficient documentation

## 2020-08-27 DIAGNOSIS — E538 Deficiency of other specified B group vitamins: Secondary | ICD-10-CM

## 2020-08-27 DIAGNOSIS — R944 Abnormal results of kidney function studies: Secondary | ICD-10-CM | POA: Insufficient documentation

## 2020-08-27 DIAGNOSIS — D631 Anemia in chronic kidney disease: Secondary | ICD-10-CM | POA: Insufficient documentation

## 2020-08-27 DIAGNOSIS — K219 Gastro-esophageal reflux disease without esophagitis: Secondary | ICD-10-CM | POA: Insufficient documentation

## 2020-08-27 DIAGNOSIS — K449 Diaphragmatic hernia without obstruction or gangrene: Secondary | ICD-10-CM | POA: Diagnosis not present

## 2020-08-27 DIAGNOSIS — Z79899 Other long term (current) drug therapy: Secondary | ICD-10-CM | POA: Insufficient documentation

## 2020-08-27 DIAGNOSIS — E1122 Type 2 diabetes mellitus with diabetic chronic kidney disease: Secondary | ICD-10-CM | POA: Insufficient documentation

## 2020-08-27 DIAGNOSIS — E211 Secondary hyperparathyroidism, not elsewhere classified: Secondary | ICD-10-CM | POA: Diagnosis not present

## 2020-08-27 DIAGNOSIS — E119 Type 2 diabetes mellitus without complications: Secondary | ICD-10-CM | POA: Diagnosis not present

## 2020-08-27 DIAGNOSIS — D696 Thrombocytopenia, unspecified: Secondary | ICD-10-CM | POA: Diagnosis not present

## 2020-08-27 DIAGNOSIS — D509 Iron deficiency anemia, unspecified: Secondary | ICD-10-CM | POA: Diagnosis not present

## 2020-08-27 LAB — CBC WITH DIFFERENTIAL/PLATELET
Abs Immature Granulocytes: 0.01 10*3/uL (ref 0.00–0.07)
Basophils Absolute: 0 10*3/uL (ref 0.0–0.1)
Basophils Relative: 1 %
Eosinophils Absolute: 0.1 10*3/uL (ref 0.0–0.5)
Eosinophils Relative: 2 %
HCT: 34.6 % — ABNORMAL LOW (ref 36.0–46.0)
Hemoglobin: 11.9 g/dL — ABNORMAL LOW (ref 12.0–15.0)
Immature Granulocytes: 0 %
Lymphocytes Relative: 27 %
Lymphs Abs: 1 10*3/uL (ref 0.7–4.0)
MCH: 31.6 pg (ref 26.0–34.0)
MCHC: 34.4 g/dL (ref 30.0–36.0)
MCV: 91.8 fL (ref 80.0–100.0)
Monocytes Absolute: 0.3 10*3/uL (ref 0.1–1.0)
Monocytes Relative: 8 %
Neutro Abs: 2.3 10*3/uL (ref 1.7–7.7)
Neutrophils Relative %: 62 %
Platelets: 49 10*3/uL — ABNORMAL LOW (ref 150–400)
RBC: 3.77 MIL/uL — ABNORMAL LOW (ref 3.87–5.11)
RDW: 14.3 % (ref 11.5–15.5)
WBC: 3.8 10*3/uL — ABNORMAL LOW (ref 4.0–10.5)
nRBC: 0 % (ref 0.0–0.2)

## 2020-08-27 LAB — RENAL FUNCTION PANEL
Albumin: 3.7 g/dL (ref 3.5–5.0)
Anion gap: 7 (ref 5–15)
BUN: 15 mg/dL (ref 8–23)
CO2: 22 mmol/L (ref 22–32)
Calcium: 8.7 mg/dL — ABNORMAL LOW (ref 8.9–10.3)
Chloride: 108 mmol/L (ref 98–111)
Creatinine, Ser: 1.55 mg/dL — ABNORMAL HIGH (ref 0.44–1.00)
GFR, Estimated: 36 mL/min — ABNORMAL LOW (ref >60)
Glucose, Bld: 225 mg/dL — ABNORMAL HIGH (ref 70–99)
Phosphorus: 3.3 mg/dL (ref 2.5–4.6)
Potassium: 4 mmol/L (ref 3.5–5.1)
Sodium: 137 mmol/L (ref 135–145)

## 2020-08-27 LAB — COMPREHENSIVE METABOLIC PANEL
ALT: 18 U/L (ref 0–44)
AST: 28 U/L (ref 15–41)
Albumin: 3.7 g/dL (ref 3.5–5.0)
Alkaline Phosphatase: 87 U/L (ref 38–126)
Anion gap: 8 (ref 5–15)
BUN: 15 mg/dL (ref 8–23)
CO2: 22 mmol/L (ref 22–32)
Calcium: 8.8 mg/dL — ABNORMAL LOW (ref 8.9–10.3)
Chloride: 107 mmol/L (ref 98–111)
Creatinine, Ser: 1.53 mg/dL — ABNORMAL HIGH (ref 0.44–1.00)
GFR, Estimated: 36 mL/min — ABNORMAL LOW (ref >60)
Glucose, Bld: 229 mg/dL — ABNORMAL HIGH (ref 70–99)
Potassium: 4 mmol/L (ref 3.5–5.1)
Sodium: 137 mmol/L (ref 135–145)
Total Bilirubin: 3.2 mg/dL — ABNORMAL HIGH (ref 0.3–1.2)
Total Protein: 6.7 g/dL (ref 6.5–8.1)

## 2020-08-27 LAB — CBC
HCT: 35.4 % — ABNORMAL LOW (ref 36.0–46.0)
Hemoglobin: 11.9 g/dL — ABNORMAL LOW (ref 12.0–15.0)
MCH: 31.3 pg (ref 26.0–34.0)
MCHC: 33.6 g/dL (ref 30.0–36.0)
MCV: 93.2 fL (ref 80.0–100.0)
Platelets: 50 10*3/uL — ABNORMAL LOW (ref 150–400)
RBC: 3.8 MIL/uL — ABNORMAL LOW (ref 3.87–5.11)
RDW: 14.5 % (ref 11.5–15.5)
WBC: 3.9 10*3/uL — ABNORMAL LOW (ref 4.0–10.5)
nRBC: 0 % (ref 0.0–0.2)

## 2020-08-27 LAB — IRON AND TIBC
Iron: 103 ug/dL (ref 28–170)
Iron: 103 ug/dL (ref 28–170)
Saturation Ratios: 39 % — ABNORMAL HIGH (ref 10.4–31.8)
Saturation Ratios: 41 % — ABNORMAL HIGH (ref 10.4–31.8)
TIBC: 261 ug/dL (ref 250–450)
TIBC: 251 ug/dL (ref 250–450)
UIBC: 158 ug/dL
UIBC: 148 ug/dL

## 2020-08-27 LAB — PROTEIN / CREATININE RATIO, URINE
Creatinine, Urine: 272.5 mg/dL
Protein Creatinine Ratio: 0.06 mg/mg{Cre} (ref 0.00–0.15)
Total Protein, Urine: 16 mg/dL

## 2020-08-27 LAB — LACTATE DEHYDROGENASE: LDH: 185 U/L (ref 98–192)

## 2020-08-27 LAB — VITAMIN D 25 HYDROXY (VIT D DEFICIENCY, FRACTURES): Vit D, 25-Hydroxy: 47.69 ng/mL (ref 30–100)

## 2020-08-27 LAB — VITAMIN B12: Vitamin B-12: 1476 pg/mL — ABNORMAL HIGH (ref 180–914)

## 2020-08-27 LAB — FERRITIN: Ferritin: 297 ng/mL (ref 11–307)

## 2020-08-27 LAB — FOLATE: Folate: 9.8 ng/mL (ref >5.9)

## 2020-08-28 LAB — PARATHYROID HORMONE, INTACT (NO CA): PTH: 44 pg/mL (ref 15–65)

## 2020-08-30 DIAGNOSIS — E1122 Type 2 diabetes mellitus with diabetic chronic kidney disease: Secondary | ICD-10-CM | POA: Diagnosis not present

## 2020-08-30 DIAGNOSIS — D631 Anemia in chronic kidney disease: Secondary | ICD-10-CM | POA: Diagnosis not present

## 2020-08-30 DIAGNOSIS — I10 Essential (primary) hypertension: Secondary | ICD-10-CM | POA: Diagnosis not present

## 2020-08-30 DIAGNOSIS — E211 Secondary hyperparathyroidism, not elsewhere classified: Secondary | ICD-10-CM | POA: Diagnosis not present

## 2020-08-30 DIAGNOSIS — N189 Chronic kidney disease, unspecified: Secondary | ICD-10-CM | POA: Diagnosis not present

## 2020-08-30 DIAGNOSIS — K7460 Unspecified cirrhosis of liver: Secondary | ICD-10-CM | POA: Diagnosis not present

## 2020-09-02 NOTE — Progress Notes (Signed)
[place], Alisha [name]   CLINIC:  Medical Oncology/Hematology  PCP:  Glenda Chroman, MD [place] [id number] [phone]   REASON FOR VISIT: Follow-up for iron deficiency anemia and thrombocytopenia   CURRENT THERAPY: Intermittent iron infusions   INTERVAL HISTORY:   Alisha [name] 69 y.o. 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  Patient reports she is doing well since her last visit.  She denies any bright red bleeding per rectum or melena.  She denies any easy bruising or bleeding. Denies any nausea, vomiting, or diarrhea. Denies any new pains.  Denies any numbness or tingling in hands or feet.  Denies any recent fevers, infections, or recent hospitalizations.  She is eating well and maintaining her weight at this time.  REVIEW OF SYSTEMS:  Review of Systems  Constitutional: Positive for fatigue.  Gastrointestinal: Negative for vomiting.  All other systems reviewed and are negative.   PAST MEDICAL/SURGICAL HISTORY:  Past Medical History:  Diagnosis Date  . B12 deficiency 11/03/2016  . B12 deficiency 11/03/2016  . Barrett's esophagus   . Cirrhosis of liver without mention of alcohol    hep B surface antigen and HCV ab negative.pt has not had hepatitis A and B vaccines.U/S on 07/04/13 shows cirrhosis.  . Diverticula of colon    pancolonic  . Esophageal reflux   . Esophageal varices ([place])   . Hiatal hernia   . Iron deficiency anemia, unspecified   . Other and unspecified hyperlipidemia   . Portal hypertensive gastropathy ([place])   . Tubular adenoma   . Type II or unspecified type diabetes mellitus without mention of complication, not stated as uncontrolled   . Unspecified essential hypertension   . Unspecified hemorrhoids without mention of complication    Past Surgical History:  Procedure Laterality Date  . ABDOMINAL HYSTERECTOMY    . BIOPSY   04/30/2016   Procedure: BIOPSY;  Surgeon: Daneil Dolin, MD;  [facility];  Service: Endoscopy;;  esophagus  . BIOPSY  02/24/2019   Procedure: BIOPSY;  Surgeon: Daneil Dolin, MD;  [facility];  Service: Endoscopy;;  . CESAREAN SECTION     x 2  . COLONOSCOPY  03/2006   left sided diverticula, splenic flexure tublar adenoma  . COLONOSCOPY  07/2002   villous tubular adenoma in rectum  . COLONOSCOPY  09/23/09   external hemorrhoids/scattered pan colonic diverticula otherwise normal. cecal lipoma bx negative, normal TI. Next TCS 09/2014  . COLONOSCOPY N/A 11/12/2014   Procedure: COLONOSCOPY;  Surgeon: Daneil Dolin, MD;  [facility];  Service: Endoscopy;  Laterality: N/A;  1215 - moved to 12:30 - Ginger to notify pt  . COLONOSCOPY N/A 01/07/2017   Rourk: Grade 4 internal hemorrhoids, 5 mm ulcer in the sigmoid colon (benign bx), 7 mm polyp removed from the cecum (tubular adenoma), diverticulosis.next tcs in five years  . ESOPHAGEAL BANDING N/A 12/20/2016   Procedure: ESOPHAGEAL BANDING;  Surgeon: Danie Binder, MD;  [facility];  Service: Endoscopy;  Laterality: N/A;  . ESOPHAGEAL BANDING N/A 03/18/2017   Procedure: ESOPHAGEAL BANDING;  Surgeon: Daneil Dolin, MD;  [facility];  Service: Endoscopy;  Laterality: N/A;  . ESOPHAGEAL BANDING  12/17/2017   Procedure: ESOPHAGEAL BANDING;  Surgeon: Daneil Dolin, MD;  [facility];  Service: Endoscopy;;  . ESOPHAGEAL BANDING N/A 07/03/2020  Procedure: ESOPHAGEAL BANDING;  Surgeon: Daneil Dolin, MD;  [facility];  Service: Endoscopy;  Laterality: N/A;  . ESOPHAGOGASTRODUODENOSCOPY  08/2006   barretts esophagus, no dyplasia  . ESOPHAGOGASTRODUODENOSCOPY  03/2006   barretts esophagus,bx focal atypia c/w low grade dysplasia, SB bx negative for celiac  . ESOPHAGOGASTRODUODENOSCOPY  09/23/09   3 columns of grade 2 esophageal varices/hiatal hernia/3-4 cm segment Barrett's  esophagus without dysplasia. Next EGD 09/2012  . ESOPHAGOGASTRODUODENOSCOPY N/A 11/11/2012   Dr. Gala Romney- Barrett;s esophagus, esophageal varices, portal gastopathy. hiatal hernia  . ESOPHAGOGASTRODUODENOSCOPY N/A 04/30/2016   Procedure: ESOPHAGOGASTRODUODENOSCOPY (EGD);  Surgeon: Daneil Dolin, MD;  [facility];  Service: Endoscopy;  Laterality: N/A;  815  . ESOPHAGOGASTRODUODENOSCOPY N/A 12/20/2016   Procedure: ESOPHAGOGASTRODUODENOSCOPY (EGD);  Surgeon: Danie Binder, MD;  [facility];  Service: Endoscopy;  Laterality: N/A;  . ESOPHAGOGASTRODUODENOSCOPY N/A 01/07/2017   Procedure: ESOPHAGOGASTRODUODENOSCOPY (EGD);  Surgeon: Daneil Dolin, MD;  [facility];  Service: Endoscopy;  Laterality: N/A;  . ESOPHAGOGASTRODUODENOSCOPY N/A 03/18/2017   Procedure: ESOPHAGOGASTRODUODENOSCOPY (EGD);  Surgeon: Daneil Dolin, MD;  [facility];  Service: Endoscopy;  Laterality: N/A;  1030   . ESOPHAGOGASTRODUODENOSCOPY N/A 12/17/2017   Procedure: ESOPHAGOGASTRODUODENOSCOPY (EGD);  Surgeon: Daneil Dolin, MD;  [facility];  Service: Endoscopy;  Laterality: N/A;  10:30am  . ESOPHAGOGASTRODUODENOSCOPY N/A 02/24/2019   Rourk: No esophageal varices seen, scars noted from prior banding.  5 cm segment of salmon-colored epithelium coming up from the GE junction consistent with Barrett's (bx with no dysplasia).  Portal gastropathy.  Small hiatal hernia.  Recommended repeat EGD in 18 months.  . ESOPHAGOGASTRODUODENOSCOPY N/A 07/03/2020   Procedure: ESOPHAGOGASTRODUODENOSCOPY (EGD);  Surgeon: Daneil Dolin, MD;  [facility];  Service: Endoscopy;  Laterality: N/A;  10:15am  . HEMORRHOID SURGERY  2008  . small bowel capsule endoscopy  10/2009   normal     SOCIAL HISTORY:  Social History   Socioeconomic History  . Marital status: Married    Spouse name: Not on file  . Number of children: Not on file  . Years of education: Not on file  . Highest  education level: Not on file  Occupational History  . Not on file  Tobacco Use  . Smoking status: Never Smoker  . Smokeless tobacco: Never Used  Vaping Use  . Vaping Use: Never used  Substance and Sexual Activity  . Alcohol use: No  . Drug use: No  . Sexual activity: Yes  Other Topics Concern  . Not on file  Social History Narrative  . Not on file   Social Determinants of Health   Financial Resource Strain: Not on file  Food Insecurity: Not on file  Transportation Needs: Not on file  Physical Activity: Not on file  Stress: Not on file  Social Connections: Not on file  Intimate Partner Violence: Not on file    FAMILY HISTORY:  Family History  Problem Relation Age of Onset  . Alzheimer's disease Mother   . CAD Father   . Diabetes Mellitus II Father   . Alzheimer's disease Father   . Diabetes Mellitus II Sister   . Colon cancer Sister 79       small, surgical excision; chemo/rad not needed  . Cancer - Other Brother   . Diabetes Mellitus II Brother   . Hypertension Brother   . Liver disease Neg Hx     CURRENT MEDICATIONS:  Outpatient Encounter  Medications as of 09/03/2020  Medication Sig  . Accu-Chek Softclix Lancets lancets USE TO CHECK BLOOD SUGAR TWICE DAILY.  Marland Kitchen Cholecalciferol (VITAMIN D) 2000 units tablet Take 2,000 Units by mouth daily.  . cyanocobalamin 1000 MCG tablet Take 1,000 mcg by mouth daily.   Marland Kitchen glipiZIDE (GLUCOTROL XL) 5 MG 24 hr tablet Take 1 tablet (5 mg total) by mouth daily with breakfast.  . lisinopril (PRINIVIL,ZESTRIL) 10 MG tablet Take 10 mg by mouth daily.  Marland Kitchen NOVOFINE 32G X 6 MM MISC USE ONE AS DIRECTED TWICE DAILY.  . pantoprazole (PROTONIX) 40 MG tablet Take 1 tablet (40 mg total) by mouth 2 (two) times daily before a meal.  . propranolol (INDERAL) 10 MG tablet TAKE THREE (3) TABLETS BY MOUTH EVERY MORNING AND 3 TABLETS EVERY EVENING.  . TRESIBA FLEXTOUCH 200 UNIT/ML SOPN Inject 60 Units into the skin at bedtime.  Marland Kitchen ULTICARE PEN NEEDLES  31G X 5 MM MISC USE ONE AS DIRECTED TWICE DAILY.  Marland Kitchen VICTOZA 18 MG/3ML SOLN Inject 1.8 mg into the skin daily.   No facility-administered encounter medications on file as of 09/03/2020.    ALLERGIES:  Allergies  Allergen Reactions  . Codeine Nausea And Vomiting     PHYSICAL EXAM:  ECOG Performance status: 1  Vitals:   09/03/20 1116  BP: (!) 141/65  Pulse: 70  Resp: 20  Temp: (!) 96.7 F (35.9 C)  SpO2: 100%   Filed Weights   09/03/20 1116  Weight: 193 lb 6.4 oz (87.7 kg)    Physical Exam Constitutional:      Appearance: Normal appearance. She is normal weight.  Musculoskeletal:        General: Normal range of motion.  Skin:    General: Skin is warm.  Neurological:     Mental Status: She is alert and oriented to person, place, and time. Mental status is at baseline.  Psychiatric:        Mood and Affect: Mood normal.        Behavior: Behavior normal.        Thought Content: Thought content normal.        Judgment: Judgment normal.    No flapping tremor, significant ascites,   LABORATORY DATA:  I have reviewed the labs as listed.  CBC    Component Value Date/Time   WBC 3.9 (L) 08/27/2020 1333   RBC 3.80 (L) 08/27/2020 1333   HGB 11.9 (L) 08/27/2020 1333   HCT 35.4 (L) 08/27/2020 1333   PLT 50 (L) 08/27/2020 1333   MCV 93.2 08/27/2020 1333   MCH 31.3 08/27/2020 1333   MCHC 33.6 08/27/2020 1333   RDW 14.5 08/27/2020 1333   LYMPHSABS 1.0 08/27/2020 1325   MONOABS 0.3 08/27/2020 1325   EOSABS 0.1 08/27/2020 1325   BASOSABS 0.0 08/27/2020 1325   CMP Latest Ref Rng & Units 08/27/2020 08/27/2020 05/29/2020  Glucose 70 - 99 mg/dL 225(H) 229(H) 236(H)  BUN 8 - 23 mg/dL 15 15 21   Creatinine 0.44 - 1.00 mg/dL 1.55(H) 1.53(H) 1.68(H)  Sodium 135 - 145 mmol/L 137 137 135  Potassium 3.5 - 5.1 mmol/L 4.0 4.0 4.4  Chloride 98 - 111 mmol/L 108 107 106  CO2 22 - 32 mmol/L 22 22 23   Calcium 8.9 - 10.3 mg/dL 8.7(L) 8.8(L) 9.0  Total Protein 6.5 - 8.1 g/dL - 6.7 -   Total Bilirubin 0.3 - 1.2 mg/dL - 3.2(H) -  Alkaline Phos 38 - 126 U/L - 87 -  AST 15 -  41 U/L - 28 -  ALT 0 - 44 U/L - 18 -   All questions were answered to patient's stated satisfaction. Encouraged patient to call with any new concerns or questions before his next visit to the cancer center and we can certain see him sooner, if needed.     ASSESSMENT & PLAN:  No problem-specific Assessment & Plan notes found for this encounter.  Orders placed this encounter:  Orders Placed This Encounter  Procedures  . CBC with Differential/Platelet  . Comprehensive metabolic panel  . Iron and TIBC  . Ferritin  . Vitamin B12   1.  Normocytic anemia:  -Last Feraheme was on 10/05/2018. -Labs from last week reviewed, no need for iron supplementation,.  2.  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 - Platelet count is more or less stable and stays around 50. - Stable on most recent labs.  3.  Elevated creatinine: - Stable.  4.  Cirrhosis: - She follows up with the liver transplant program at [facility]. -Her last MRI of the abdomen without contrast on 10/04/2018 showed she had cirrhosis and portal venous hypertension, without suspicious liver lesion.  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  5.  B12 deficiency: - Continue taking thrice a week No need for daily supplementation.  Benay Pike MD

## 2020-09-03 ENCOUNTER — Inpatient Hospital Stay (HOSPITAL_COMMUNITY): Payer: Medicare PPO | Admitting: Hematology and Oncology

## 2020-09-03 ENCOUNTER — Other Ambulatory Visit: Payer: Self-pay

## 2020-09-03 ENCOUNTER — Encounter (HOSPITAL_COMMUNITY): Payer: Self-pay | Admitting: Hematology and Oncology

## 2020-09-03 VITALS — BP 141/65 | HR 70 | Temp 96.7°F | Resp 20 | Wt 193.4 lb

## 2020-09-03 DIAGNOSIS — D696 Thrombocytopenia, unspecified: Secondary | ICD-10-CM | POA: Diagnosis not present

## 2020-09-03 DIAGNOSIS — D500 Iron deficiency anemia secondary to blood loss (chronic): Secondary | ICD-10-CM | POA: Diagnosis not present

## 2020-09-03 DIAGNOSIS — R944 Abnormal results of kidney function studies: Secondary | ICD-10-CM | POA: Diagnosis not present

## 2020-09-03 DIAGNOSIS — D509 Iron deficiency anemia, unspecified: Secondary | ICD-10-CM | POA: Diagnosis not present

## 2020-09-03 DIAGNOSIS — K2270 Barrett's esophagus without dysplasia: Secondary | ICD-10-CM | POA: Diagnosis not present

## 2020-09-03 DIAGNOSIS — E538 Deficiency of other specified B group vitamins: Secondary | ICD-10-CM | POA: Diagnosis not present

## 2020-09-03 DIAGNOSIS — K449 Diaphragmatic hernia without obstruction or gangrene: Secondary | ICD-10-CM | POA: Diagnosis not present

## 2020-09-03 DIAGNOSIS — K219 Gastro-esophageal reflux disease without esophagitis: Secondary | ICD-10-CM | POA: Diagnosis not present

## 2020-09-03 DIAGNOSIS — E785 Hyperlipidemia, unspecified: Secondary | ICD-10-CM | POA: Diagnosis not present

## 2020-09-03 DIAGNOSIS — K7460 Unspecified cirrhosis of liver: Secondary | ICD-10-CM | POA: Diagnosis not present

## 2020-09-09 ENCOUNTER — Other Ambulatory Visit: Payer: Self-pay | Admitting: "Endocrinology

## 2020-09-16 ENCOUNTER — Other Ambulatory Visit (HOSPITAL_COMMUNITY): Payer: Medicare PPO

## 2020-09-20 ENCOUNTER — Ambulatory Visit (HOSPITAL_COMMUNITY): Payer: Medicare PPO | Admitting: Nurse Practitioner

## 2020-09-23 DIAGNOSIS — R918 Other nonspecific abnormal finding of lung field: Secondary | ICD-10-CM | POA: Diagnosis not present

## 2020-09-23 DIAGNOSIS — K7460 Unspecified cirrhosis of liver: Secondary | ICD-10-CM | POA: Diagnosis not present

## 2020-09-23 DIAGNOSIS — K7581 Nonalcoholic steatohepatitis (NASH): Secondary | ICD-10-CM | POA: Diagnosis not present

## 2020-09-23 DIAGNOSIS — R911 Solitary pulmonary nodule: Secondary | ICD-10-CM | POA: Diagnosis not present

## 2020-09-23 DIAGNOSIS — Z79899 Other long term (current) drug therapy: Secondary | ICD-10-CM | POA: Diagnosis not present

## 2020-09-23 DIAGNOSIS — Z885 Allergy status to narcotic agent status: Secondary | ICD-10-CM | POA: Diagnosis not present

## 2020-09-23 DIAGNOSIS — R160 Hepatomegaly, not elsewhere classified: Secondary | ICD-10-CM | POA: Diagnosis not present

## 2020-10-01 DIAGNOSIS — R772 Abnormality of alphafetoprotein: Secondary | ICD-10-CM | POA: Diagnosis not present

## 2020-10-01 DIAGNOSIS — K7469 Other cirrhosis of liver: Secondary | ICD-10-CM | POA: Diagnosis not present

## 2020-10-01 DIAGNOSIS — R160 Hepatomegaly, not elsewhere classified: Secondary | ICD-10-CM | POA: Diagnosis not present

## 2020-10-01 DIAGNOSIS — K7689 Other specified diseases of liver: Secondary | ICD-10-CM | POA: Diagnosis not present

## 2020-10-04 ENCOUNTER — Other Ambulatory Visit: Payer: Self-pay

## 2020-10-04 ENCOUNTER — Telehealth (INDEPENDENT_AMBULATORY_CARE_PROVIDER_SITE_OTHER): Payer: Medicare PPO | Admitting: Gastroenterology

## 2020-10-04 ENCOUNTER — Encounter: Payer: Self-pay | Admitting: Gastroenterology

## 2020-10-04 DIAGNOSIS — K2270 Barrett's esophagus without dysplasia: Secondary | ICD-10-CM | POA: Diagnosis not present

## 2020-10-04 DIAGNOSIS — K7581 Nonalcoholic steatohepatitis (NASH): Secondary | ICD-10-CM | POA: Diagnosis not present

## 2020-10-04 DIAGNOSIS — R932 Abnormal findings on diagnostic imaging of liver and biliary tract: Secondary | ICD-10-CM

## 2020-10-04 NOTE — Progress Notes (Signed)
Primary Care Physician:  Glenda Chroman, MD Primary GI:  Garfield Cornea, MD   Patient Location: Home  Provider Location: Home  Reason for Visit:  Chief Complaint  Patient presents with  . Cirrhosis   Persons present on the virtual encounter, with roles: Patient, myself (provider),  Total time (minutes) spent on medical discussion: 20 minutes  Due to COVID-19, visit was conducted using Mychart Video method.  Visit was requested by patient.  Virtual Visit via Mychart Video  I connected with [name] on 10/04/20 at 10:00 AM EST by MyChart Video and verified that I am speaking with the correct person using two identifiers.   I discussed the limitations, risks, security and privacy concerns of performing an evaluation and management service by telephone/video and the availability of in person appointments. I also discussed with the patient that there may be a patient responsible charge related to this service. The patient expressed understanding and agreed to proceed.   HPI:   Alisha [name] is a 70 y.o. female who presents for virtual visit regarding cirrhosis, Barrett's esophagus. Last seen in 03/2020. 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 She is followed here locally and at [facility]. She also has history segment 7 LR-4 liver lobe lesion and rising AFP being followed at [facility] 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   Since her last OV, she completed EGD 06/2020 as outlined below. Had virtual visit with [facility] this month. She reports they are concerned about a lung lesion which they have been following which looks more concerning for lung cancer now. See imaging below. She is trying to decide if she wants to pursue biopsy or diagnostic VATS lobectomy. She is being followed by Dr. Williemae Natter. She  completed IVC guided liver biopsy 05/2020 which was negative for malignancy, but did not demonstrate findings c/w segment 7 lesion, raising concern that the mass was not sampled. If her lung nodule turns out to be cancer, they are favoring treating the LR-4 liver lesion with locoregional therapy.  She feels ok. She states her appetite is fair. No N/V. No abdominal pain, swelling. BMs regular. No melena, brbpr. Energy level fair. States her weight was 193 pounds about 2 weeks ago. States her heart rate as been averaging around 70 on propranolol.  Last MELD Na in 04/2020 was 19. AFP increased from 162 in 01/2020 to 311 in 04/2020. Due for labs soon with [facility].   She completed IVC guided liver biopsy on 06/06/20 negative for malignancy, but did not demonstrate findings c/w segment 7 lesion, raising concern that mass was not sampled.   OLD Chest CT 2011: 2.2 X 0.6 X 0.9 cm spiculated density in right middle lobe. PET to follow was normal. Lesion seen on CT Abd 12/2016 and measured 1.5 X 2.4 cm.   MRI Abd with and without contrast 08/24/20 at [facility]:  Impression:  Hepatic findings:  1. Observation 1: Stable lesion within hepatic segment 7 and compared to  the most recent prior however compared to a more remote prior from  02/06/2020 this lesion has slightly increased in size which is borderline  for meeting threshold growth criteria. Similar T2 correlate. No washout or  capsule. (LIRADS 4)   2. Cirrhotic liver morphology with sequela of portal hypertension including  splenomegaly, recannulated umbilical vein, and large upper abdominal  varices, notably esophageal  varices.   Extrahepatic findings:  No significant change in the right middle lobe consolidation. Attention on  same-day chest CT. Multiple cystic pancreatic lesions again seen and not significantly changed from prior.   CT Chest without contrast 08/24/20 at [facility]: Impression:  1. Enlarging mixed solid and groundglass nodule in the lateral  segment of  the right middle lobe, suspicious for adenocarcinoma aspreviously  described. This has been submitted as an unexpected finding.  2. 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  3. No evidence of metastatic disease in the chest.  4. Extensive esophageal varices.   PET 09/23/20 at [facility]: IMPRESSION:  1. Redemonstrated minimally FDG avid right middle lobe consolidative  opacity, which has progressively increased in size compared to 2011, and  remains worrisome for malignancy.  2. Non FDG avid right lower lobe groundglass nodule may be infectious or  inflammatory, but an indolent malignancy is not excluded; recommend  continued CT follow up.  3. No FDG avid metastatic disease.   Last EGD 06/2020: No varices, scar noted from prior banding. 5 cm irregular tongue noted in distal esophagus consistent with Barrett's esophagus (no biopsies taken with previous biopsies in 2020), mild portal hypertensive gastropathy, small hiatal hernia. Next EGD in 18 months.  Last colonoscopy 12/2016: Grade 4 internal hemorrhoids, 5 mm ulcer in the sigmoid colon (benign biopsy), 7 mm polyp removed from the cecum (tubular adenoma), diverticulosis. Next colonoscopy in 5 years.  Current Outpatient Medications  Medication Sig Dispense Refill  . Accu-Chek Softclix Lancets lancets USE TO CHECK BLOOD SUGAR TWICE DAILY. 100 each 1  . Cholecalciferol (VITAMIN D) 2000 units tablet Take 2,000 Units by mouth daily.    . cyanocobalamin 1000 MCG tablet Take 1,000 mcg by mouth daily.     Marland Kitchen glipiZIDE (GLUCOTROL XL) 5 MG 24 hr tablet TAKE ONE TABLET BY MOUTH ONCE DAILY WITH BREAKFAST. 30 tablet 3  . lisinopril (PRINIVIL,ZESTRIL) 10 MG tablet Take 10 mg by mouth daily.    Marland Kitchen NOVOFINE 32G X 6 MM MISC USE ONE AS DIRECTED TWICE DAILY.    . pantoprazole (PROTONIX) 40 MG tablet Take 1 tablet (40 mg total) by mouth 2 (two) times daily before a meal. 60 tablet 1  .  propranolol (INDERAL) 10 MG tablet TAKE THREE (3) TABLETS BY MOUTH EVERY MORNING AND 3 TABLETS EVERY EVENING. 180 tablet 5  . TRESIBA FLEXTOUCH 200 UNIT/ML SOPN Inject 60 Units into the skin at bedtime.  12  . ULTICARE PEN NEEDLES 31G X 5 MM MISC USE ONE AS DIRECTED TWICE DAILY.    Marland Kitchen VICTOZA 18 MG/3ML SOLN Inject 1.8 mg into the skin daily.     No current facility-administered medications for this visit.    Past Medical History:  Diagnosis Date  . B12 deficiency 11/03/2016  . B12 deficiency 11/03/2016  . Barrett's esophagus   . Cirrhosis of liver without mention of alcohol    hep B surface antigen and HCV ab negative.pt has not had hepatitis A and B vaccines.U/S on 07/04/13 shows cirrhosis.  . Diverticula of colon    pancolonic  . Esophageal reflux   . Esophageal varices ([place])   . Hiatal hernia   . Iron deficiency anemia, unspecified   . Other and unspecified hyperlipidemia   . Portal hypertensive gastropathy ([place])   . Tubular adenoma   . Type II or unspecified type diabetes mellitus without mention of complication, not stated as uncontrolled   .  Unspecified essential hypertension   . Unspecified hemorrhoids without mention of complication     Past Surgical History:  Procedure Laterality Date  . ABDOMINAL HYSTERECTOMY    . BIOPSY  04/30/2016   Procedure: BIOPSY;  Surgeon: Daneil Dolin, MD;  [facility];  Service: Endoscopy;;  esophagus  . BIOPSY  02/24/2019   Procedure: BIOPSY;  Surgeon: Daneil Dolin, MD;  [facility];  Service: Endoscopy;;  . CESAREAN SECTION     x 2  . COLONOSCOPY  03/2006   left sided diverticula, splenic flexure tublar adenoma  . COLONOSCOPY  07/2002   villous tubular adenoma in rectum  . COLONOSCOPY  09/23/09   external hemorrhoids/scattered pan colonic diverticula otherwise normal. cecal lipoma bx negative, normal TI. Next TCS 09/2014  . COLONOSCOPY N/A 11/12/2014   Procedure: COLONOSCOPY;  Surgeon: Daneil Dolin, MD;   [facility];  Service: Endoscopy;  Laterality: N/A;  1215 - moved to 12:30 - Ginger to notify pt  . COLONOSCOPY N/A 01/07/2017   Rourk: Grade 4 internal hemorrhoids, 5 mm ulcer in the sigmoid colon (benign bx), 7 mm polyp removed from the cecum (tubular adenoma), diverticulosis.next tcs in five years  . ESOPHAGEAL BANDING N/A 12/20/2016   Procedure: ESOPHAGEAL BANDING;  Surgeon: Danie Binder, MD;  [facility];  Service: Endoscopy;  Laterality: N/A;  . ESOPHAGEAL BANDING N/A 03/18/2017   Procedure: ESOPHAGEAL BANDING;  Surgeon: Daneil Dolin, MD;  [facility];  Service: Endoscopy;  Laterality: N/A;  . ESOPHAGEAL BANDING  12/17/2017   Procedure: ESOPHAGEAL BANDING;  Surgeon: Daneil Dolin, MD;  [facility];  Service: Endoscopy;;  . ESOPHAGEAL BANDING N/A 07/03/2020   Procedure: ESOPHAGEAL BANDING;  Surgeon: Daneil Dolin, MD;  [facility];  Service: Endoscopy;  Laterality: N/A;  . ESOPHAGOGASTRODUODENOSCOPY  08/2006   barretts esophagus, no dyplasia  . ESOPHAGOGASTRODUODENOSCOPY  03/2006   barretts esophagus,bx focal atypia c/w low grade dysplasia, SB bx negative for celiac  . ESOPHAGOGASTRODUODENOSCOPY  09/23/09   3 columns of grade 2 esophageal varices/hiatal hernia/3-4 cm segment Barrett's esophagus without dysplasia. Next EGD 09/2012  . ESOPHAGOGASTRODUODENOSCOPY N/A 11/11/2012   Dr. Gala Romney- Barrett;s esophagus, esophageal varices, portal gastopathy. hiatal hernia  . ESOPHAGOGASTRODUODENOSCOPY N/A 04/30/2016   Procedure: ESOPHAGOGASTRODUODENOSCOPY (EGD);  Surgeon: Daneil Dolin, MD;  [facility];  Service: Endoscopy;  Laterality: N/A;  815  . ESOPHAGOGASTRODUODENOSCOPY N/A 12/20/2016   Procedure: ESOPHAGOGASTRODUODENOSCOPY (EGD);  Surgeon: Danie Binder, MD;  [facility];  Service: Endoscopy;  Laterality: N/A;  . ESOPHAGOGASTRODUODENOSCOPY N/A 01/07/2017   Procedure: ESOPHAGOGASTRODUODENOSCOPY (EGD);  Surgeon:  Daneil Dolin, MD;  [facility];  Service: Endoscopy;  Laterality: N/A;  . ESOPHAGOGASTRODUODENOSCOPY N/A 03/18/2017   Procedure: ESOPHAGOGASTRODUODENOSCOPY (EGD);  Surgeon: Daneil Dolin, MD;  [facility];  Service: Endoscopy;  Laterality: N/A;  1030   . ESOPHAGOGASTRODUODENOSCOPY N/A 12/17/2017   Procedure: ESOPHAGOGASTRODUODENOSCOPY (EGD);  Surgeon: Daneil Dolin, MD;  [facility];  Service: Endoscopy;  Laterality: N/A;  10:30am  . ESOPHAGOGASTRODUODENOSCOPY N/A 02/24/2019   Rourk: No esophageal varices seen, scars noted from prior banding.  5 cm segment of salmon-colored epithelium coming up from the GE junction consistent with Barrett's (bx with no dysplasia).  Portal gastropathy.  Small hiatal hernia.  Recommended repeat EGD in 18 months.  . ESOPHAGOGASTRODUODENOSCOPY N/A 07/03/2020   Procedure: ESOPHAGOGASTRODUODENOSCOPY (EGD);  Surgeon: Daneil Dolin, MD;  [facility];  Service: Endoscopy;  Laterality: N/A;  10:15am  . HEMORRHOID SURGERY  2008  . small bowel capsule endoscopy  10/2009   normal    Family History  Problem Relation Age of Onset  . Alzheimer's disease Mother   . CAD Father   . Diabetes Mellitus II Father   . Alzheimer's disease Father   . Diabetes Mellitus II Sister   . Colon cancer Sister 64       small, surgical excision; chemo/rad not needed  . Cancer - Other Brother   . Diabetes Mellitus II Brother   . Hypertension Brother   . Liver disease Neg Hx     Social History   Socioeconomic History  . Marital status: Married    Spouse name: Not on file  . Number of children: Not on file  . Years of education: Not on file  . Highest education level: Not on file  Occupational History  . Not on file  Tobacco Use  . Smoking status: Never Smoker  . Smokeless tobacco: Never Used  Vaping Use  . Vaping Use: Never used  Substance and Sexual Activity  . Alcohol use: No  . Drug use: No  . Sexual activity: Yes   Other Topics Concern  . Not on file  Social History Narrative  . Not on file   Social Determinants of Health   Financial Resource Strain: Not on file  Food Insecurity: Not on file  Transportation Needs: Not on file  Physical Activity: Not on file  Stress: Not on file  Social Connections: Not on file  Intimate Partner Violence: Not on file      ROS:  General: Negative for anorexia, weight loss, fever, chills, fatigue, weakness. Eyes: Negative for vision changes.  ENT: Negative for hoarseness, difficulty swallowing , nasal congestion. CV: Negative for chest pain, angina, palpitations, dyspnea on exertion, peripheral edema.  Respiratory: Negative for dyspnea at rest, dyspnea on exertion, cough, sputum, wheezing.  GI: See history of present illness. GU:  Negative for dysuria, hematuria, urinary incontinence, urinary frequency, nocturnal urination.  MS: Negative for joint pain, low back pain.  Derm: Negative for rash or itching.  Neuro: Negative for weakness, abnormal sensation, seizure, frequent headaches, memory loss, confusion.  Psych: Negative for anxiety, depression, suicidal ideation, hallucinations.  Endo: Negative for unusual weight change.  Heme: Negative for bruising or bleeding. Allergy: Negative for rash or hives.   Observations/Objective: Pleasant, cooperative, female in NAD. No jaundice, skin tone normal. No confusion.    Lab Results  Component Value Date   ALT 18 08/27/2020   AST 28 08/27/2020   ALKPHOS 87 08/27/2020   BILITOT 3.2 (H) 08/27/2020   Lab Results  Component Value Date   CREATININE 1.55 (H) 08/27/2020   BUN 15 08/27/2020   NA 137 08/27/2020   K 4.0 08/27/2020   CL 108 08/27/2020   CO2 22 08/27/2020   Lab Results  Component Value Date   INR 1.2 (H) 03/28/2018   INR 1.25 11/18/2017   INR 1.33 12/21/2016   Lab Results  Component Value Date   WBC 3.9 (L) 08/27/2020   HGB 11.9 (L) 08/27/2020   HCT 35.4 (L) 08/27/2020   MCV 93.2  08/27/2020   PLT 50 (L) 08/27/2020   Lab Results  Component Value Date   IRON 103 08/27/2020   TIBC 251 08/27/2020   FERRITIN 297 08/27/2020   Lab Results  Component Value Date   VITAMINB12 1,476 (H) 08/27/2020  Lab Results  Component Value Date   FOLATE 9.8 08/27/2020    Lab Results  Component Value Date   HGBA1C 8.5 (A) 08/21/2020    Assessment and Plan: Pleasant 70 y/o female with NASH cirrhosis, Barrett's esophagus, pancreatic lesions here for follow up.   NASH cirrhosis: complicated by esophageal varices (banded in 2019) and LR-4 right hepatic lobe lesion, rising AFP followed by Dr. Dorris Fetch at [facility]. She is up to date on imaging. She has pending labs with [facility]. EGD up to date with plans for repeat EGD in 12/2021. She has lung lesion now concerning for malignancy and she is deciding if she wants to pursue biopsy vs lobectomy. Treatment of liver lesion based on lung findings. Her AFP has doubled as outlined above. Will follow up on upcoming labs. She will continue to follow with [name] between visits at [facility]. Will plan to see her back here in 01/2020 but can adjust if coincides with her other appointments at [facility] liver.   Barrett's esophagus: continue pantoprazole once to twice daily for reflux control. Next EGD in 18 months.   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 Continued surveillance.  Follow Up Instructions:    I discussed the assessment and treatment plan with the patient. The patient was provided an opportunity to ask questions and all were answered. The patient agreed with the plan and demonstrated an understanding of the instructions. AVS mailed to patient's home address.   The patient was advised to call back or seek an in-person evaluation if the symptoms worsen or if the condition fails to improve as anticipated.  I provided 20 minutes of virtual face-to-face time during this encounter.   Neil Crouch, PA-C

## 2020-10-06 ENCOUNTER — Telehealth: Payer: Self-pay | Admitting: Gastroenterology

## 2020-10-06 NOTE — Telephone Encounter (Signed)
Patient seen virtually 10/04/20.  Please schedule OV in 01/2021 with Dr. Gala Romney.

## 2020-10-06 NOTE — Patient Instructions (Addendum)
1. We will continue to follow your progress at [facility] including upcoming labs. 2. Continue pantoprazole 36m once to twice daily as needed to control reflux. Try to limit to once daily, take only second daily dose if needed.  3. Continue propranolol three times daily.  4. We will plan to see you back in 01/2021 but if your [facility] appointments see to be falling close to our appointment, please reschedule with [place] Let's try to alternate visits with [facility].  5. Call with any questions or concerns.

## 2020-10-09 ENCOUNTER — Telehealth: Payer: Self-pay | Admitting: *Deleted

## 2020-10-09 NOTE — Telephone Encounter (Signed)
Alisha [name], you are scheduled for a virtual visit with your provider today.  Just as we do with appointments in the office, we must obtain your consent to participate.  Your consent will be active for this visit and any virtual visit you may have with one of our providers in the next 365 days.  If you have a MyChart account, I can also send a copy of this consent to you electronically.  All virtual visits are billed to your insurance company just like a traditional visit in the office.  As this is a virtual visit, video technology does not allow for your provider to perform a traditional examination.  This may limit your provider's ability to fully assess your condition.  If your provider identifies any concerns that need to be evaluated in person or the need to arrange testing such as labs, EKG, etc, we will make arrangements to do so.  Although advances in technology are sophisticated, we cannot ensure that it will always work on either your end or our end.  If the connection with a video visit is poor, we may have to switch to a telephone visit.  With either a video or telephone visit, we are not always able to ensure that we have a secure connection.   I need to obtain your verbal consent now.   Are you willing to proceed with your visit today?

## 2020-10-09 NOTE — Telephone Encounter (Signed)
Pt consented to a virtual visit on 10/04/2020.

## 2020-10-11 ENCOUNTER — Other Ambulatory Visit (HOSPITAL_COMMUNITY)
Admission: RE | Admit: 2020-10-11 | Discharge: 2020-10-11 | Disposition: A | Discharge status: Home / Self Care | Payer: Medicare PPO | Source: Ambulatory Visit | Attending: Gastroenterology | Admitting: Gastroenterology

## 2020-10-11 ENCOUNTER — Other Ambulatory Visit: Payer: Self-pay

## 2020-10-11 DIAGNOSIS — K7469 Other cirrhosis of liver: Secondary | ICD-10-CM | POA: Insufficient documentation

## 2020-10-11 DIAGNOSIS — R160 Hepatomegaly, not elsewhere classified: Secondary | ICD-10-CM | POA: Diagnosis not present

## 2020-10-11 LAB — CBC WITH DIFFERENTIAL/PLATELET
Abs Immature Granulocytes: 0.01 10*3/uL (ref 0.00–0.07)
Basophils Absolute: 0 10*3/uL (ref 0.0–0.1)
Basophils Relative: 1 %
Eosinophils Absolute: 0.2 10*3/uL (ref 0.0–0.5)
Eosinophils Relative: 3 %
HCT: 36.4 % (ref 36.0–46.0)
Hemoglobin: 12.5 g/dL (ref 12.0–15.0)
Immature Granulocytes: 0 %
Lymphocytes Relative: 22 %
Lymphs Abs: 1 10*3/uL (ref 0.7–4.0)
MCH: 31.7 pg (ref 26.0–34.0)
MCHC: 34.3 g/dL (ref 30.0–36.0)
MCV: 92.4 fL (ref 80.0–100.0)
Monocytes Absolute: 0.4 10*3/uL (ref 0.1–1.0)
Monocytes Relative: 8 %
Neutro Abs: 3.2 10*3/uL (ref 1.7–7.7)
Neutrophils Relative %: 66 %
Platelets: 53 10*3/uL — ABNORMAL LOW (ref 150–400)
RBC: 3.94 MIL/uL (ref 3.87–5.11)
RDW: 14.4 % (ref 11.5–15.5)
WBC: 4.8 10*3/uL (ref 4.0–10.5)
nRBC: 0 % (ref 0.0–0.2)

## 2020-10-11 LAB — COMPREHENSIVE METABOLIC PANEL
ALT: 25 U/L (ref 0–44)
AST: 35 U/L (ref 15–41)
Albumin: 3.5 g/dL (ref 3.5–5.0)
Alkaline Phosphatase: 81 U/L (ref 38–126)
Anion gap: 3 — ABNORMAL LOW (ref 5–15)
BUN: 14 mg/dL (ref 8–23)
CO2: 26 mmol/L (ref 22–32)
Calcium: 8.7 mg/dL — ABNORMAL LOW (ref 8.9–10.3)
Chloride: 109 mmol/L (ref 98–111)
Creatinine, Ser: 1.42 mg/dL — ABNORMAL HIGH (ref 0.44–1.00)
GFR, Estimated: 40 mL/min — ABNORMAL LOW (ref >60)
Glucose, Bld: 91 mg/dL (ref 70–99)
Potassium: 4.1 mmol/L (ref 3.5–5.1)
Sodium: 138 mmol/L (ref 135–145)
Total Bilirubin: 3.4 mg/dL — ABNORMAL HIGH (ref 0.3–1.2)
Total Protein: 6.7 g/dL (ref 6.5–8.1)

## 2020-10-11 LAB — PROTIME-INR
INR: 1.4 — ABNORMAL HIGH (ref 0.8–1.2)
Prothrombin Time: 16.3 seconds — ABNORMAL HIGH (ref 11.4–15.2)

## 2020-10-12 LAB — AFP TUMOR MARKER: AFP, Serum, Tumor Marker: 468 ng/mL — ABNORMAL HIGH (ref 0.0–8.3)

## 2020-10-30 DIAGNOSIS — R918 Other nonspecific abnormal finding of lung field: Secondary | ICD-10-CM | POA: Diagnosis not present

## 2020-10-30 DIAGNOSIS — J95811 Postprocedural pneumothorax: Secondary | ICD-10-CM | POA: Diagnosis not present

## 2020-10-30 DIAGNOSIS — M47814 Spondylosis without myelopathy or radiculopathy, thoracic region: Secondary | ICD-10-CM | POA: Diagnosis not present

## 2020-11-08 DIAGNOSIS — R918 Other nonspecific abnormal finding of lung field: Secondary | ICD-10-CM | POA: Diagnosis not present

## 2020-11-22 ENCOUNTER — Telehealth: Payer: Self-pay | Admitting: Gastroenterology

## 2020-11-22 NOTE — Telephone Encounter (Signed)
Reviewed labs available since her office visit here.  Labs from 10/30/2020: White blood cell count 4700, hemoglobin 13, platelets 68,000, sodium 137, BUN 19, creatinine 1.7, INR 1.3, total bilirubin 3.4, alkaline phosphatase 78, AST 38, ALT 22 Meld sodium of 19  Labs done October 11, 2020: White blood cell count 4800, hemoglobin 12.5, platelets 53,000, sodium 138, potassium 4.1, BUN 14, creatinine 1.42, AST 35, ALT 25, alk phos 81, total bilirubin 3.4, INR 1.4, AFP tumor marker 468 up from 311 Meld sodium of 18  Patient has upcoming appointment with Dr. Kappus at [facility].  Recent lung mass biopsy with benign findings but not clear if needs to be resampled.  She has pending follow-up appointments with oncology at [facility] and locally.  We will continue to follow.    

## 2020-11-27 ENCOUNTER — Other Ambulatory Visit (HOSPITAL_COMMUNITY)
Admission: RE | Admit: 2020-11-27 | Discharge: 2020-11-27 | Disposition: A | Discharge status: Home / Self Care | Payer: Medicare PPO | Source: Ambulatory Visit | Attending: Nephrology | Admitting: Nephrology

## 2020-11-27 DIAGNOSIS — I10 Essential (primary) hypertension: Secondary | ICD-10-CM | POA: Insufficient documentation

## 2020-11-27 DIAGNOSIS — D631 Anemia in chronic kidney disease: Secondary | ICD-10-CM | POA: Diagnosis not present

## 2020-11-27 DIAGNOSIS — E1122 Type 2 diabetes mellitus with diabetic chronic kidney disease: Secondary | ICD-10-CM | POA: Diagnosis not present

## 2020-11-27 DIAGNOSIS — N189 Chronic kidney disease, unspecified: Secondary | ICD-10-CM | POA: Insufficient documentation

## 2020-11-27 DIAGNOSIS — K7460 Unspecified cirrhosis of liver: Secondary | ICD-10-CM | POA: Diagnosis not present

## 2020-11-27 DIAGNOSIS — E211 Secondary hyperparathyroidism, not elsewhere classified: Secondary | ICD-10-CM | POA: Diagnosis not present

## 2020-11-27 LAB — RENAL FUNCTION PANEL
Albumin: 3.6 g/dL (ref 3.5–5.0)
Anion gap: 8 (ref 5–15)
BUN: 20 mg/dL (ref 8–23)
CO2: 21 mmol/L — ABNORMAL LOW (ref 22–32)
Calcium: 9 mg/dL (ref 8.9–10.3)
Chloride: 109 mmol/L (ref 98–111)
Creatinine, Ser: 1.52 mg/dL — ABNORMAL HIGH (ref 0.44–1.00)
GFR, Estimated: 37 mL/min — ABNORMAL LOW (ref >60)
Glucose, Bld: 218 mg/dL — ABNORMAL HIGH (ref 70–99)
Phosphorus: 3.2 mg/dL (ref 2.5–4.6)
Potassium: 4.7 mmol/L (ref 3.5–5.1)
Sodium: 138 mmol/L (ref 135–145)

## 2020-11-27 LAB — CBC
HCT: 36.1 % (ref 36.0–46.0)
Hemoglobin: 12.5 g/dL (ref 12.0–15.0)
MCH: 31.4 pg (ref 26.0–34.0)
MCHC: 34.6 g/dL (ref 30.0–36.0)
MCV: 90.7 fL (ref 80.0–100.0)
Platelets: 52 10*3/uL — ABNORMAL LOW (ref 150–400)
RBC: 3.98 MIL/uL (ref 3.87–5.11)
RDW: 13.8 % (ref 11.5–15.5)
WBC: 4.6 10*3/uL (ref 4.0–10.5)
nRBC: 0 % (ref 0.0–0.2)

## 2020-11-28 DIAGNOSIS — N189 Chronic kidney disease, unspecified: Secondary | ICD-10-CM | POA: Diagnosis not present

## 2020-11-28 DIAGNOSIS — E872 Acidosis: Secondary | ICD-10-CM | POA: Diagnosis not present

## 2020-11-28 DIAGNOSIS — D631 Anemia in chronic kidney disease: Secondary | ICD-10-CM | POA: Diagnosis not present

## 2020-11-28 DIAGNOSIS — I10 Essential (primary) hypertension: Secondary | ICD-10-CM | POA: Diagnosis not present

## 2020-11-28 DIAGNOSIS — D696 Thrombocytopenia, unspecified: Secondary | ICD-10-CM | POA: Diagnosis not present

## 2020-11-28 DIAGNOSIS — E1122 Type 2 diabetes mellitus with diabetic chronic kidney disease: Secondary | ICD-10-CM | POA: Diagnosis not present

## 2020-11-28 LAB — MICROALBUMIN / CREATININE URINE RATIO
Creatinine, Urine: 279.5 mg/dL
Microalb Creat Ratio: 4 mg/g creat (ref 0–29)
Microalb, Ur: 10.2 ug/mL — ABNORMAL HIGH

## 2020-12-02 ENCOUNTER — Inpatient Hospital Stay (HOSPITAL_COMMUNITY): Payer: Medicare PPO | Attending: Hematology

## 2020-12-02 ENCOUNTER — Other Ambulatory Visit: Payer: Self-pay

## 2020-12-02 DIAGNOSIS — D509 Iron deficiency anemia, unspecified: Secondary | ICD-10-CM | POA: Insufficient documentation

## 2020-12-02 DIAGNOSIS — D696 Thrombocytopenia, unspecified: Secondary | ICD-10-CM

## 2020-12-02 DIAGNOSIS — D500 Iron deficiency anemia secondary to blood loss (chronic): Secondary | ICD-10-CM

## 2020-12-02 LAB — IRON AND TIBC
Iron: 109 ug/dL (ref 28–170)
Saturation Ratios: 42 % — ABNORMAL HIGH (ref 10.4–31.8)
TIBC: 258 ug/dL (ref 250–450)
UIBC: 149 ug/dL

## 2020-12-02 LAB — CBC WITH DIFFERENTIAL/PLATELET
Abs Immature Granulocytes: 0.01 10*3/uL (ref 0.00–0.07)
Basophils Absolute: 0 10*3/uL (ref 0.0–0.1)
Basophils Relative: 1 %
Eosinophils Absolute: 0.1 10*3/uL (ref 0.0–0.5)
Eosinophils Relative: 3 %
HCT: 35.5 % — ABNORMAL LOW (ref 36.0–46.0)
Hemoglobin: 12.2 g/dL (ref 12.0–15.0)
Immature Granulocytes: 0 %
Lymphocytes Relative: 23 %
Lymphs Abs: 0.9 10*3/uL (ref 0.7–4.0)
MCH: 31.4 pg (ref 26.0–34.0)
MCHC: 34.4 g/dL (ref 30.0–36.0)
MCV: 91.5 fL (ref 80.0–100.0)
Monocytes Absolute: 0.3 10*3/uL (ref 0.1–1.0)
Monocytes Relative: 8 %
Neutro Abs: 2.5 10*3/uL (ref 1.7–7.7)
Neutrophils Relative %: 65 %
Platelets: 49 10*3/uL — ABNORMAL LOW (ref 150–400)
RBC: 3.88 MIL/uL (ref 3.87–5.11)
RDW: 14.1 % (ref 11.5–15.5)
WBC: 3.8 10*3/uL — ABNORMAL LOW (ref 4.0–10.5)
nRBC: 0 % (ref 0.0–0.2)

## 2020-12-02 LAB — COMPREHENSIVE METABOLIC PANEL
ALT: 20 U/L (ref 0–44)
AST: 30 U/L (ref 15–41)
Albumin: 3.5 g/dL (ref 3.5–5.0)
Alkaline Phosphatase: 84 U/L (ref 38–126)
Anion gap: 8 (ref 5–15)
BUN: 21 mg/dL (ref 8–23)
CO2: 22 mmol/L (ref 22–32)
Calcium: 8.9 mg/dL (ref 8.9–10.3)
Chloride: 108 mmol/L (ref 98–111)
Creatinine, Ser: 1.68 mg/dL — ABNORMAL HIGH (ref 0.44–1.00)
GFR, Estimated: 33 mL/min — ABNORMAL LOW (ref >60)
Glucose, Bld: 253 mg/dL — ABNORMAL HIGH (ref 70–99)
Potassium: 4.6 mmol/L (ref 3.5–5.1)
Sodium: 138 mmol/L (ref 135–145)
Total Bilirubin: 3 mg/dL — ABNORMAL HIGH (ref 0.3–1.2)
Total Protein: 6.8 g/dL (ref 6.5–8.1)

## 2020-12-02 LAB — FERRITIN: Ferritin: 277 ng/mL (ref 11–307)

## 2020-12-02 LAB — VITAMIN B12: Vitamin B-12: 1109 pg/mL — ABNORMAL HIGH (ref 180–914)

## 2020-12-03 DIAGNOSIS — N289 Disorder of kidney and ureter, unspecified: Secondary | ICD-10-CM | POA: Diagnosis not present

## 2020-12-03 DIAGNOSIS — R918 Other nonspecific abnormal finding of lung field: Secondary | ICD-10-CM | POA: Diagnosis not present

## 2020-12-03 DIAGNOSIS — K766 Portal hypertension: Secondary | ICD-10-CM | POA: Diagnosis not present

## 2020-12-03 DIAGNOSIS — K769 Liver disease, unspecified: Secondary | ICD-10-CM | POA: Diagnosis not present

## 2020-12-03 DIAGNOSIS — E1165 Type 2 diabetes mellitus with hyperglycemia: Secondary | ICD-10-CM | POA: Diagnosis not present

## 2020-12-03 DIAGNOSIS — K7689 Other specified diseases of liver: Secondary | ICD-10-CM | POA: Diagnosis not present

## 2020-12-03 DIAGNOSIS — I8510 Secondary esophageal varices without bleeding: Secondary | ICD-10-CM | POA: Diagnosis not present

## 2020-12-03 DIAGNOSIS — K7460 Unspecified cirrhosis of liver: Secondary | ICD-10-CM | POA: Diagnosis not present

## 2020-12-03 DIAGNOSIS — K7581 Nonalcoholic steatohepatitis (NASH): Secondary | ICD-10-CM | POA: Diagnosis not present

## 2020-12-03 DIAGNOSIS — K7469 Other cirrhosis of liver: Secondary | ICD-10-CM | POA: Diagnosis not present

## 2020-12-03 DIAGNOSIS — R772 Abnormality of alphafetoprotein: Secondary | ICD-10-CM | POA: Diagnosis not present

## 2020-12-09 ENCOUNTER — Ambulatory Visit (HOSPITAL_COMMUNITY): Payer: Medicare PPO | Admitting: Hematology

## 2020-12-11 ENCOUNTER — Encounter: Payer: Self-pay | Admitting: Internal Medicine

## 2020-12-19 ENCOUNTER — Inpatient Hospital Stay (HOSPITAL_COMMUNITY): Payer: Medicare PPO | Admitting: Hematology

## 2020-12-19 ENCOUNTER — Telehealth: Payer: Self-pay | Admitting: "Endocrinology

## 2020-12-19 NOTE — Telephone Encounter (Signed)
She did a CMP at [facility],  she still needs Lipid, T4 and TSH. I see nothing from [facility] and they are on the same system we are.

## 2020-12-19 NOTE — Telephone Encounter (Signed)
Pt has appt on Monday and she said she did her labs at [facility]. Can you check

## 2020-12-19 NOTE — Telephone Encounter (Signed)
I spoke to patient and she said she will go today.

## 2020-12-20 ENCOUNTER — Other Ambulatory Visit (HOSPITAL_COMMUNITY)
Admission: RE | Admit: 2020-12-20 | Discharge: 2020-12-20 | Disposition: A | Discharge status: Home / Self Care | Payer: Medicare PPO | Source: Ambulatory Visit | Attending: "Endocrinology | Admitting: "Endocrinology

## 2020-12-20 ENCOUNTER — Other Ambulatory Visit: Payer: Self-pay

## 2020-12-20 DIAGNOSIS — E1122 Type 2 diabetes mellitus with diabetic chronic kidney disease: Secondary | ICD-10-CM | POA: Insufficient documentation

## 2020-12-20 DIAGNOSIS — E1165 Type 2 diabetes mellitus with hyperglycemia: Secondary | ICD-10-CM | POA: Insufficient documentation

## 2020-12-20 DIAGNOSIS — N183 Chronic kidney disease, stage 3 unspecified: Secondary | ICD-10-CM | POA: Diagnosis not present

## 2020-12-20 LAB — LIPID PANEL
Cholesterol: 157 mg/dL (ref 0–200)
HDL: 42 mg/dL (ref >40)
LDL Cholesterol: 91 mg/dL (ref 0–99)
Total CHOL/HDL Ratio: 3.7 RATIO
Triglycerides: 121 mg/dL (ref <150)
VLDL: 24 mg/dL (ref 0–40)

## 2020-12-20 LAB — COMPREHENSIVE METABOLIC PANEL
ALT: 21 U/L (ref 0–44)
AST: 29 U/L (ref 15–41)
Albumin: 3.8 g/dL (ref 3.5–5.0)
Alkaline Phosphatase: 91 U/L (ref 38–126)
Anion gap: 8 (ref 5–15)
BUN: 20 mg/dL (ref 8–23)
CO2: 22 mmol/L (ref 22–32)
Calcium: 9 mg/dL (ref 8.9–10.3)
Chloride: 109 mmol/L (ref 98–111)
Creatinine, Ser: 1.79 mg/dL — ABNORMAL HIGH (ref 0.44–1.00)
GFR, Estimated: 30 mL/min — ABNORMAL LOW (ref >60)
Glucose, Bld: 142 mg/dL — ABNORMAL HIGH (ref 70–99)
Potassium: 4 mmol/L (ref 3.5–5.1)
Sodium: 139 mmol/L (ref 135–145)
Total Bilirubin: 3.7 mg/dL — ABNORMAL HIGH (ref 0.3–1.2)
Total Protein: 7 g/dL (ref 6.5–8.1)

## 2020-12-20 LAB — T4, FREE: Free T4: 0.97 ng/dL (ref 0.61–1.12)

## 2020-12-20 LAB — TSH: TSH: 2.531 u[IU]/mL (ref 0.350–4.500)

## 2020-12-23 ENCOUNTER — Other Ambulatory Visit: Payer: Self-pay

## 2020-12-23 ENCOUNTER — Ambulatory Visit: Payer: Medicare PPO | Admitting: "Endocrinology

## 2020-12-23 ENCOUNTER — Encounter: Payer: Self-pay | Admitting: "Endocrinology

## 2020-12-23 VITALS — BP 130/69 | HR 76 | Ht 61.0 in | Wt 192.6 lb

## 2020-12-23 DIAGNOSIS — N183 Chronic kidney disease, stage 3 unspecified: Secondary | ICD-10-CM | POA: Diagnosis not present

## 2020-12-23 DIAGNOSIS — IMO0002 Reserved for concepts with insufficient information to code with codable children: Secondary | ICD-10-CM

## 2020-12-23 DIAGNOSIS — I10 Essential (primary) hypertension: Secondary | ICD-10-CM

## 2020-12-23 DIAGNOSIS — E559 Vitamin D deficiency, unspecified: Secondary | ICD-10-CM | POA: Diagnosis not present

## 2020-12-23 DIAGNOSIS — E1165 Type 2 diabetes mellitus with hyperglycemia: Secondary | ICD-10-CM

## 2020-12-23 DIAGNOSIS — E1122 Type 2 diabetes mellitus with diabetic chronic kidney disease: Secondary | ICD-10-CM | POA: Diagnosis not present

## 2020-12-23 LAB — POCT GLYCOSYLATED HEMOGLOBIN (HGB A1C): HbA1c, POC (controlled diabetic range): 8.8 % — AB (ref 0.0–7.0)

## 2020-12-23 NOTE — Progress Notes (Signed)
12/23/2020, 11:09 AM  Endocrinology follow-up note   Subjective:    Patient ID: Alisha [name], female    DOB: 02-04-1951.  Alisha [name] is being seen in follow-up after she was seen in consultation for management of currently uncontrolled symptomatic diabetes requested by  Glenda Chroman, MD.   Past Medical History:  Diagnosis Date  . B12 deficiency 11/03/2016  . B12 deficiency 11/03/2016  . Barrett's esophagus   . Cirrhosis of liver without mention of alcohol    hep B surface antigen and HCV ab negative.pt has not had hepatitis A and B vaccines.U/S on 07/04/13 shows cirrhosis.  . Diverticula of colon    pancolonic  . Esophageal reflux   . Esophageal varices ([place])   . Hiatal hernia   . Iron deficiency anemia, unspecified   . Other and unspecified hyperlipidemia   . Portal hypertensive gastropathy ([place])   . Tubular adenoma   . Type II or unspecified type diabetes mellitus without mention of complication, not stated as uncontrolled   . Unspecified essential hypertension   . Unspecified hemorrhoids without mention of complication     Past Surgical History:  Procedure Laterality Date  . ABDOMINAL HYSTERECTOMY    . BIOPSY  04/30/2016   Procedure: BIOPSY;  Surgeon: Daneil Dolin, MD;  [facility];  Service: Endoscopy;;  esophagus  . BIOPSY  02/24/2019   Procedure: BIOPSY;  Surgeon: Daneil Dolin, MD;  [facility];  Service: Endoscopy;;  . CESAREAN SECTION     x 2  . COLONOSCOPY  03/2006   left sided diverticula, splenic flexure tublar adenoma  . COLONOSCOPY  07/2002   villous tubular adenoma in rectum  . COLONOSCOPY  09/23/09   external hemorrhoids/scattered pan colonic diverticula otherwise normal. cecal lipoma bx negative, normal TI. Next TCS 09/2014  . COLONOSCOPY N/A 11/12/2014   Procedure: COLONOSCOPY;  Surgeon: Daneil Dolin, MD;  [facility];   Service: Endoscopy;  Laterality: N/A;  1215 - moved to 12:30 - Ginger to notify pt  . COLONOSCOPY N/A 01/07/2017   Rourk: Grade 4 internal hemorrhoids, 5 mm ulcer in the sigmoid colon (benign bx), 7 mm polyp removed from the cecum (tubular adenoma), diverticulosis.next tcs in five years  . ESOPHAGEAL BANDING N/A 12/20/2016   Procedure: ESOPHAGEAL BANDING;  Surgeon: Danie Binder, MD;  [facility];  Service: Endoscopy;  Laterality: N/A;  . ESOPHAGEAL BANDING N/A 03/18/2017   Procedure: ESOPHAGEAL BANDING;  Surgeon: Daneil Dolin, MD;  [facility];  Service: Endoscopy;  Laterality: N/A;  . ESOPHAGEAL BANDING  12/17/2017   Procedure: ESOPHAGEAL BANDING;  Surgeon: Daneil Dolin, MD;  [facility];  Service: Endoscopy;;  . ESOPHAGEAL BANDING N/A 07/03/2020   Procedure: ESOPHAGEAL BANDING;  Surgeon: Daneil Dolin, MD;  [facility];  Service: Endoscopy;  Laterality: N/A;  . ESOPHAGOGASTRODUODENOSCOPY  08/2006   barretts esophagus, no dyplasia  . ESOPHAGOGASTRODUODENOSCOPY  03/2006   barretts esophagus,bx focal atypia c/w low grade dysplasia, SB bx negative for celiac  . ESOPHAGOGASTRODUODENOSCOPY  09/23/09   3 columns of grade 2 esophageal varices/hiatal  hernia/3-4 cm segment Barrett's esophagus without dysplasia. Next EGD 09/2012  . ESOPHAGOGASTRODUODENOSCOPY N/A 11/11/2012   Dr. Gala Romney- Barrett;s esophagus, esophageal varices, portal gastopathy. hiatal hernia  . ESOPHAGOGASTRODUODENOSCOPY N/A 04/30/2016   Procedure: ESOPHAGOGASTRODUODENOSCOPY (EGD);  Surgeon: Daneil Dolin, MD;  [facility];  Service: Endoscopy;  Laterality: N/A;  815  . ESOPHAGOGASTRODUODENOSCOPY N/A 12/20/2016   Procedure: ESOPHAGOGASTRODUODENOSCOPY (EGD);  Surgeon: Danie Binder, MD;  [facility];  Service: Endoscopy;  Laterality: N/A;  . ESOPHAGOGASTRODUODENOSCOPY N/A 01/07/2017   Procedure: ESOPHAGOGASTRODUODENOSCOPY (EGD);  Surgeon: Daneil Dolin, MD;   [facility];  Service: Endoscopy;  Laterality: N/A;  . ESOPHAGOGASTRODUODENOSCOPY N/A 03/18/2017   Procedure: ESOPHAGOGASTRODUODENOSCOPY (EGD);  Surgeon: Daneil Dolin, MD;  [facility];  Service: Endoscopy;  Laterality: N/A;  1030   . ESOPHAGOGASTRODUODENOSCOPY N/A 12/17/2017   Procedure: ESOPHAGOGASTRODUODENOSCOPY (EGD);  Surgeon: Daneil Dolin, MD;  [facility];  Service: Endoscopy;  Laterality: N/A;  10:30am  . ESOPHAGOGASTRODUODENOSCOPY N/A 02/24/2019   Rourk: No esophageal varices seen, scars noted from prior banding.  5 cm segment of salmon-colored epithelium coming up from the GE junction consistent with Barrett's (bx with no dysplasia).  Portal gastropathy.  Small hiatal hernia.  Recommended repeat EGD in 18 months.  . ESOPHAGOGASTRODUODENOSCOPY N/A 07/03/2020   Procedure: ESOPHAGOGASTRODUODENOSCOPY (EGD);  Surgeon: Daneil Dolin, MD;  [facility];  Service: Endoscopy;  Laterality: N/A;  10:15am  . HEMORRHOID SURGERY  2008  . small bowel capsule endoscopy  10/2009   normal    Social History   Socioeconomic History  . Marital status: Married    Spouse name: Not on file  . Number of children: Not on file  . Years of education: Not on file  . Highest education level: Not on file  Occupational History  . Not on file  Tobacco Use  . Smoking status: Never Smoker  . Smokeless tobacco: Never Used  Vaping Use  . Vaping Use: Never used  Substance and Sexual Activity  . Alcohol use: No  . Drug use: No  . Sexual activity: Yes  Other Topics Concern  . Not on file  Social History Narrative  . Not on file   Social Determinants of Health   Financial Resource Strain: Not on file  Food Insecurity: Not on file  Transportation Needs: Not on file  Physical Activity: Not on file  Stress: Not on file  Social Connections: Not on file    Family History  Problem Relation Age of Onset  . Alzheimer's disease Mother   . CAD Father   .  Diabetes Mellitus II Father   . Alzheimer's disease Father   . Diabetes Mellitus II Sister   . Colon cancer Sister 95       small, surgical excision; chemo/rad not needed  . Cancer - Other Brother   . Diabetes Mellitus II Brother   . Hypertension Brother   . Liver disease Neg Hx     Outpatient Encounter Medications as of 12/23/2020  Medication Sig  . Accu-Chek Softclix Lancets lancets USE TO CHECK BLOOD SUGAR TWICE DAILY.  Marland Kitchen Cholecalciferol (VITAMIN D) 2000 units tablet Take 2,000 Units by mouth daily.  . cyanocobalamin 1000 MCG tablet Take 1,000 mcg by mouth every other day.  Marland Kitchen glipiZIDE (GLUCOTROL XL) 5 MG 24 hr tablet TAKE ONE TABLET BY MOUTH ONCE DAILY WITH BREAKFAST.  Marland Kitchen lisinopril (PRINIVIL,ZESTRIL) 10 MG tablet Take 10 mg by mouth daily.  Marland Kitchen  NOVOFINE 32G X 6 MM MISC USE ONE AS DIRECTED TWICE DAILY.  . pantoprazole (PROTONIX) 40 MG tablet Take 1 tablet (40 mg total) by mouth 2 (two) times daily before a meal.  . propranolol (INDERAL) 10 MG tablet TAKE THREE (3) TABLETS BY MOUTH EVERY MORNING AND 3 TABLETS EVERY EVENING.  . TRESIBA FLEXTOUCH 200 UNIT/ML SOPN Inject 70 Units into the skin at bedtime.  Marland Kitchen ULTICARE PEN NEEDLES 31G X 5 MM MISC USE ONE AS DIRECTED TWICE DAILY.  Marland Kitchen VICTOZA 18 MG/3ML SOLN Inject 1.8 mg into the skin daily.   No facility-administered encounter medications on file as of 12/23/2020.    ALLERGIES: Allergies  Allergen Reactions  . Codeine Nausea And Vomiting    VACCINATION STATUS:  There is no immunization history on file for this patient.  Diabetes She presents for her follow-up diabetic visit. She has type 2 diabetes mellitus. Onset time: She was diagnosed at approximate age of 63 years. Her disease course has been fluctuating. There are no hypoglycemic associated symptoms. Pertinent negatives for hypoglycemia include no confusion, headaches, pallor or seizures. Pertinent negatives for diabetes include no chest pain, no fatigue, no polydipsia, no  polyphagia and no polyuria. (She did not bring any logs nor meter to review.  Patient verbally reports that she is having fluctuating glycemic profile including hypoglycemia.) There are no hypoglycemic complications. Symptoms are worsening. Diabetic complications include nephropathy. Risk factors for coronary artery disease include diabetes mellitus, hypertension, obesity, post-menopausal and family history. Current diabetic treatments: She is currently on Tresiba 52 units nightly, Victoza 1.8 mg subcutaneously daily, glipizide 10 mg daily. Her weight is decreasing steadily. She is following a generally unhealthy diet. When asked about meal planning, she reported none. She has had a previous visit with a dietitian. She participates in exercise intermittently. Her home blood glucose trend is increasing steadily. Her breakfast blood glucose range is generally 140-180 mg/dl. Her bedtime blood glucose range is generally 180-200 mg/dl. Her overall blood glucose range is 180-200 mg/dl. (She presents with fluctuating glycemic profile, significant fasting hyperglycemia.  Her point-of-care A1c is 8.8%, unchanged from 8.5% A1c during her last visit.  She did not document or report hypoglycemia.    ) An ACE inhibitor/angiotensin II receptor blocker is being taken. Eye exam is current.  Hypertension This is a chronic problem. The current episode started more than 1 year ago. The problem is controlled. Pertinent negatives include no chest pain, headaches, palpitations or shortness of breath. Risk factors for coronary artery disease include diabetes mellitus. Past treatments include ACE inhibitors. Hypertensive end-organ damage includes kidney disease. Identifiable causes of hypertension include chronic renal disease.     Review of Systems  Constitutional: Negative for chills, fatigue, fever and unexpected weight change.  HENT: Negative for trouble swallowing and voice change.   Eyes: Negative for visual disturbance.   Respiratory: Negative for cough, shortness of breath and wheezing.   Cardiovascular: Negative for chest pain, palpitations and leg swelling.  Gastrointestinal: Negative for diarrhea, nausea and vomiting.  Endocrine: Negative for cold intolerance, heat intolerance, polydipsia, polyphagia and polyuria.  Musculoskeletal: Negative for arthralgias and myalgias.  Skin: Negative for color change, pallor, rash and wound.  Neurological: Negative for seizures and headaches.  Psychiatric/Behavioral: Negative for confusion and suicidal ideas.    Objective:    Vitals with BMI 12/23/2020 09/03/2020 08/21/2020  Height 5' 1"  - 5' 1"   Weight 192 lbs 10 oz 193 lbs 6 oz 197 lbs  BMI 36.41 19.62 22.97  Systolic 989  308 657  Diastolic 69 65 68  Pulse 76 70 68    BP 130/69   Pulse 76   Ht 5' 1"  (1.549 m)   Wt 192 lb 9.6 oz (87.4 kg)   BMI 36.39 kg/m   Wt Readings from Last 3 Encounters:  12/23/20 192 lb 9.6 oz (87.4 kg)  09/03/20 193 lb 6.4 oz (87.7 kg)  08/21/20 197 lb (89.4 kg)     Physical Exam Constitutional:      Appearance: She is well-developed.  HENT:     Head: Normocephalic and atraumatic.  Neck:     Thyroid: No thyromegaly.     Trachea: No tracheal deviation.  Cardiovascular:     Rate and Rhythm: Normal rate and regular rhythm.  Pulmonary:     Effort: Pulmonary effort is normal.  Abdominal:     Tenderness: There is no abdominal tenderness. There is no guarding.  Musculoskeletal:        General: Normal range of motion.     Cervical back: Normal range of motion and neck supple.     Comments: Her foot exam was normal today.  Skin:    General: Skin is warm and dry.     Coloration: Skin is not pale.     Findings: No erythema or rash.  Neurological:     Mental Status: She is alert and oriented to person, place, and time.     Cranial Nerves: No cranial nerve deficit.     Coordination: Coordination normal.     Deep Tendon Reflexes: Reflexes are normal and symmetric.   Psychiatric:        Judgment: Judgment normal.       CMP ( most recent) CMP     Component Value Date/Time   NA 139 12/20/2020 1511   K 4.0 12/20/2020 1511   CL 109 12/20/2020 1511   CO2 22 12/20/2020 1511   GLUCOSE 142 (H) 12/20/2020 1511   BUN 20 12/20/2020 1511   BUN 15 01/25/2020 0000   CREATININE 1.79 (H) 12/20/2020 1511   CREATININE 1.62 (H) 03/28/2018 1146   CALCIUM 9.0 12/20/2020 1511   CALCIUM 9.1 01/25/2020 1059   PROT 7.0 12/20/2020 1511   ALBUMIN 3.8 12/20/2020 1511   AST 29 12/20/2020 1511   ALT 21 12/20/2020 1511   ALKPHOS 91 12/20/2020 1511   BILITOT 3.7 (H) 12/20/2020 1511   GFRNONAA 30 (L) 12/20/2020 1511   GFRAA 36 (L) 05/29/2020 1416     Diabetic Labs (most recent): Lab Results  Component Value Date   HGBA1C 8.8 (A) 12/23/2020   HGBA1C 8.5 (A) 08/21/2020   HGBA1C 8.1 05/07/2020     Lipid Panel ( most recent) Lipid Panel     Component Value Date/Time   CHOL 157 12/20/2020 1511   TRIG 121 12/20/2020 1511   HDL 42 12/20/2020 1511   CHOLHDL 3.7 12/20/2020 1511   VLDL 24 12/20/2020 1511   LDLCALC 91 12/20/2020 1511      Lab Results  Component Value Date   TSH 2.531 12/20/2020   TSH 2.46 01/25/2020   FREET4 0.97 12/20/2020      Assessment & Plan:   1. Uncontrolled type 2 diabetes mellitus with stage 3 chronic kidney disease ([place])  - Alisha Gobble Angeletti has currently uncontrolled symptomatic type 2 DM since 70 years of age.  She presents with fluctuating glycemic profile, significant fasting hyperglycemia.  Her point-of-care A1c is 8.8%, unchanged from 8.5% A1c during her last visit.  She did not  document or report hypoglycemia.     Recent labs reviewed. - I had a long discussion with her about the progressive nature of diabetes and the pathology behind its complications. -her diabetes is complicated by CKD and she remains at a high risk for more acute and chronic complications which include CAD, CVA, CKD, retinopathy, and  neuropathy. These are all discussed in detail with her.  - I have counseled her on diet  and weight management  by adopting a carbohydrate restricted/protein rich diet. Patient is encouraged to switch to  unprocessed or minimally processed     complex starch and increased protein intake (animal or plant source), fruits, and vegetables. -  she is advised to stick to a routine mealtimes to eat 3 meals  a day and avoid unnecessary snacks ( to snack only to correct hypoglycemia).   - she acknowledges that there is a room for improvement in her food and drink choices. - Suggestion is made for her to avoid simple carbohydrates  from her diet including Cakes, Sweet Desserts, Ice Cream, Soda (diet and regular), Sweet Tea, Candies, Chips, Cookies, Store Bought Juices, Alcohol in Excess of  1-2 drinks a day, Artificial Sweeteners,  Coffee Creamer, and "Sugar-free" Products, Lemonade. This will help patient to have more stable blood glucose profile and potentially avoid unintended weight gain.   - she will be scheduled with Jearld Fenton, RDN, CDE for diabetes education.  - I have approached her with the following individualized plan to manage  her diabetes and patient agrees:   - she has required multiple daily injections of insulin to control diabetes in the past.   -Based on her presentation with near target glycemic profile, she will not need prandial insulin for now.  She will tolerate a higher dose of basal insulin.  I discussed and increase her Tyler Aas to 70 units nightly, agrees and advised to continue monitoring blood glucose at least twice a day-daily before breakfast and at bedtime until her next visit. - she is warned not to take insulin without proper monitoring per orders. - she is encouraged to call clinic for blood glucose levels less than 70 or above 200 mg /dl. - she is advised to continue Victoza 1.8 mg of 3 CBC, glipizide 5 mg XL p.o. daily at breakfast.    - she is not a candidate for  Metformin, SGLT2 inhibitors due to concurrent renal insufficiency.  - Specific targets for  A1c;  LDL, HDL,  and Triglycerides were discussed with the patient.  2) Blood Pressure /Hypertension: Her blood pressure is controlled to target.she is advised to continue her current medications including lisinopril 10 mg p.o. daily with breakfast .   3) Lipids/Hyperlipidemia:   Review of her recent lipid panel showed  controlled uncontrolled LDL at 91.    she  is not on statins.  She will be considered for fasting lipid panel and low-dose statins on subsequent visits.    4)  Weight/Diet:  Body mass index is 36.39 kg/m.  -   clearly complicating her diabetes care.   she is  a candidate for weight loss. I discussed with her the fact that loss of 5 - 10% of her  current body weight will have the most impact on her diabetes management.  Exercise, and detailed carbohydrates information provided  -  detailed on discharge instructions.  5) Chronic Care/Health Maintenance:  -she  is on ACEI Statin medications and  is encouraged to initiate and continue to follow up with  Ophthalmology, Dentist,  Podiatrist at least yearly or according to recommendations, and advised to   stay away from smoking. I have recommended yearly flu vaccine and pneumonia vaccine at least every 5 years; moderate intensity exercise for up to 150 minutes weekly; and  sleep for at least 7 hours a day.  6.  Vitamin D deficiency: She is advised to continue cholecalciferol 2000 units daily.   She recently had normal ABI, will be repeated in 5 years.  - she is  advised to maintain close follow up with Glenda Chroman, MD for primary care needs, as well as her other providers for optimal and coordinated care.     I spent 41 minutes in the care of the patient today including review of labs from [place], Lipids, Thyroid Function, Hematology (current and previous including abstractions from other facilities); face-to-face time discussing  her blood  glucose readings/logs, discussing hypoglycemia and hyperglycemia episodes and symptoms, medications doses, her options of short and long term treatment based on the latest standards of care / guidelines;  discussion about incorporating lifestyle medicine;  and documenting the encounter. We discussed  detailed management plan for currently uncontrolled type 2 diabetes and associated comorbidities including hypertension, hyperlipidemia, and vitamin D deficiency.   Please refer to Patient Instructions for Blood Glucose Monitoring and Insulin/Medications Dosing Guide"  in media tab for additional information. Please  also refer to " Patient Self Inventory" in the Media  tab for reviewed elements of pertinent patient history.  Alisha Gobble [place]a participated in the discussions, expressed understanding, and voiced agreement with the above plans.  All questions were answered to her satisfaction. she is encouraged to contact clinic should she have any questions or concerns prior to her return visit.    Follow up plan: - Return in about 4 months (around 04/24/2021) for Bring Meter and Logs- A1c in Office.  Glade Lloyd, MD [facility] [address] [place], [place] [address] Phone: [phone]  Fax: [phone]    12/23/2020, 11:09 AM  This note was partially dictated with voice recognition software. Similar sounding words can be transcribed inadequately or may not  be corrected upon review.

## 2020-12-23 NOTE — Patient Instructions (Signed)

## 2020-12-30 DIAGNOSIS — K7689 Other specified diseases of liver: Secondary | ICD-10-CM | POA: Diagnosis not present

## 2020-12-30 DIAGNOSIS — K769 Liver disease, unspecified: Secondary | ICD-10-CM | POA: Diagnosis not present

## 2021-01-03 ENCOUNTER — Other Ambulatory Visit (HOSPITAL_COMMUNITY): Payer: Self-pay | Admitting: *Deleted

## 2021-01-03 DIAGNOSIS — E538 Deficiency of other specified B group vitamins: Secondary | ICD-10-CM

## 2021-01-03 DIAGNOSIS — D500 Iron deficiency anemia secondary to blood loss (chronic): Secondary | ICD-10-CM

## 2021-01-03 NOTE — Progress Notes (Signed)
[name], [place] [address]   CLINIC:  Medical Oncology/Hematology  PCP:  Glenda Chroman, MD [address] [place] [address] [phone]   REASON FOR VISIT:  Follow-up for iron deficiency anemia and thrombocytopenia  CURRENT THERAPY: Intermittent IV iron infusions  INTERVAL HISTORY:  [name] 70 y.o. 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  She requires intermittent IV iron infusions, last given on 05/15/2020 and 05/22/2020.  She was last seen in office by Dr. Chryl Heck on 09/03/2020.  Labs obtained on 12/03/2019 showed platelets 49 (stable at baseline), ferritin in 277, iron saturation 42%, hemoglobin 12.2.  Patient reports she has been doing well since her last visit.  She did have a biopsy at [facility] of lung lesion in February 2022 that resulted in pneumothorax, but this has resolved. She denies any new bone pain or recent fractures. She denies any B symptoms such as fever, chills, night sweats, and unintentional weight loss.  No new masses or lymphadenopathy. Denies any nausea, vomiting, or diarrhea. Denies early satiety and abdominal pain. Has not noticed any recent bleeding such as epistaxis, hematuria, hematemesis, melena, or hematochezia.  Denies recent chest pain on exertion, shortness of breath on minimal exertion, pre-syncopal episodes, or palpitations.  Patient reports 50% appetite and energy level at 100%. She is maintaining stable weight at this time.  REVIEW OF SYSTEMS:  Review of Systems  Constitutional: Negative for appetite change, chills, diaphoresis, fatigue, fever and unexpected weight change.  HENT:   Negative for lump/mass and nosebleeds.        Seasonal allergies  Eyes: Negative for eye problems.  Respiratory: Negative for cough, hemoptysis and shortness of breath.   Cardiovascular: Negative for chest pain, leg swelling and palpitations.   Gastrointestinal: Negative for abdominal pain, blood in stool, constipation, diarrhea, nausea and vomiting.  Genitourinary: Negative for hematuria.   Skin: Negative.   Neurological: Negative for dizziness, headaches and light-headedness.  Hematological: Does not bruise/bleed easily.      PAST MEDICAL/SURGICAL HISTORY:  Past Medical History:  Diagnosis Date  . B12 deficiency 11/03/2016  . B12 deficiency 11/03/2016  . Barrett's esophagus   . Cirrhosis of liver without mention of alcohol    hep B surface antigen and HCV ab negative.pt has not had hepatitis A and B vaccines.U/S on 07/04/13 shows cirrhosis.  . Diverticula of colon    pancolonic  . Esophageal reflux   . Esophageal varices ([place])   . Hiatal hernia   . Iron deficiency anemia, unspecified   . Other and unspecified hyperlipidemia   . Portal hypertensive gastropathy ([place])   . Tubular adenoma   . Type II or unspecified type diabetes mellitus without mention of complication, not stated as uncontrolled   . Unspecified essential hypertension   . Unspecified hemorrhoids without mention of complication    Past Surgical History:  Procedure Laterality Date  . ABDOMINAL HYSTERECTOMY    . BIOPSY  04/30/2016   Procedure: BIOPSY;  Surgeon: Daneil Dolin, MD;  [facility];  Service: Endoscopy;;  esophagus  . BIOPSY  02/24/2019   Procedure: BIOPSY;  Surgeon: Daneil Dolin, MD;  [facility];  Service: Endoscopy;;  . CESAREAN SECTION     x 2  . COLONOSCOPY  03/2006   left sided diverticula, splenic flexure tublar adenoma  . COLONOSCOPY  07/2002   villous tubular adenoma in rectum  .  COLONOSCOPY  09/23/09   external hemorrhoids/scattered pan colonic diverticula otherwise normal. cecal lipoma bx negative, normal TI. Next TCS 09/2014  . COLONOSCOPY N/A 11/12/2014   Procedure: COLONOSCOPY;  Surgeon: Daneil Dolin, MD;  [facility];  Service: Endoscopy;  Laterality: N/A;  1215 - moved to 12:30 - Ginger  to notify pt  . COLONOSCOPY N/A 01/07/2017   Rourk: Grade 4 internal hemorrhoids, 5 mm ulcer in the sigmoid colon (benign bx), 7 mm polyp removed from the cecum (tubular adenoma), diverticulosis.next tcs in five years  . ESOPHAGEAL BANDING N/A 12/20/2016   Procedure: ESOPHAGEAL BANDING;  Surgeon: Danie Binder, MD;  [facility];  Service: Endoscopy;  Laterality: N/A;  . ESOPHAGEAL BANDING N/A 03/18/2017   Procedure: ESOPHAGEAL BANDING;  Surgeon: Daneil Dolin, MD;  [facility];  Service: Endoscopy;  Laterality: N/A;  . ESOPHAGEAL BANDING  12/17/2017   Procedure: ESOPHAGEAL BANDING;  Surgeon: Daneil Dolin, MD;  [facility];  Service: Endoscopy;;  . ESOPHAGEAL BANDING N/A 07/03/2020   Procedure: ESOPHAGEAL BANDING;  Surgeon: Daneil Dolin, MD;  [facility];  Service: Endoscopy;  Laterality: N/A;  . ESOPHAGOGASTRODUODENOSCOPY  08/2006   barretts esophagus, no dyplasia  . ESOPHAGOGASTRODUODENOSCOPY  03/2006   barretts esophagus,bx focal atypia c/w low grade dysplasia, SB bx negative for celiac  . ESOPHAGOGASTRODUODENOSCOPY  09/23/09   3 columns of grade 2 esophageal varices/hiatal hernia/3-4 cm segment Barrett's esophagus without dysplasia. Next EGD 09/2012  . ESOPHAGOGASTRODUODENOSCOPY N/A 11/11/2012   Dr. Gala Romney- Barrett;s esophagus, esophageal varices, portal gastopathy. hiatal hernia  . ESOPHAGOGASTRODUODENOSCOPY N/A 04/30/2016   Procedure: ESOPHAGOGASTRODUODENOSCOPY (EGD);  Surgeon: Daneil Dolin, MD;  [facility];  Service: Endoscopy;  Laterality: N/A;  815  . ESOPHAGOGASTRODUODENOSCOPY N/A 12/20/2016   Procedure: ESOPHAGOGASTRODUODENOSCOPY (EGD);  Surgeon: Danie Binder, MD;  [facility];  Service: Endoscopy;  Laterality: N/A;  . ESOPHAGOGASTRODUODENOSCOPY N/A 01/07/2017   Procedure: ESOPHAGOGASTRODUODENOSCOPY (EGD);  Surgeon: Daneil Dolin, MD;  [facility];  Service: Endoscopy;  Laterality: N/A;  .  ESOPHAGOGASTRODUODENOSCOPY N/A 03/18/2017   Procedure: ESOPHAGOGASTRODUODENOSCOPY (EGD);  Surgeon: Daneil Dolin, MD;  [facility];  Service: Endoscopy;  Laterality: N/A;  1030   . ESOPHAGOGASTRODUODENOSCOPY N/A 12/17/2017   Procedure: ESOPHAGOGASTRODUODENOSCOPY (EGD);  Surgeon: Daneil Dolin, MD;  [facility];  Service: Endoscopy;  Laterality: N/A;  10:30am  . ESOPHAGOGASTRODUODENOSCOPY N/A 02/24/2019   Rourk: No esophageal varices seen, scars noted from prior banding.  5 cm segment of salmon-colored epithelium coming up from the GE junction consistent with Barrett's (bx with no dysplasia).  Portal gastropathy.  Small hiatal hernia.  Recommended repeat EGD in 18 months.  . ESOPHAGOGASTRODUODENOSCOPY N/A 07/03/2020   Procedure: ESOPHAGOGASTRODUODENOSCOPY (EGD);  Surgeon: Daneil Dolin, MD;  [facility];  Service: Endoscopy;  Laterality: N/A;  10:15am  . HEMORRHOID SURGERY  2008  . small bowel capsule endoscopy  10/2009   normal     SOCIAL HISTORY:  Social History   Socioeconomic History  . Marital status: Married    Spouse name: Not on file  . Number of children: Not on file  . Years of education: Not on file  . Highest education level: Not on file  Occupational History  . Not on file  Tobacco Use  . Smoking status: Never Smoker  . Smokeless tobacco: Never Used  Vaping Use  . Vaping Use: Never used  Substance and  Sexual Activity  . Alcohol use: No  . Drug use: No  . Sexual activity: Yes  Other Topics Concern  . Not on file  Social History Narrative  . Not on file   Social Determinants of Health   Financial Resource Strain: Not on file  Food Insecurity: Not on file  Transportation Needs: Not on file  Physical Activity: Not on file  Stress: Not on file  Social Connections: Not on file  Intimate Partner Violence: Not on file    FAMILY HISTORY:  Family History  Problem Relation Age of Onset  . Alzheimer's disease Mother   . CAD  Father   . Diabetes Mellitus II Father   . Alzheimer's disease Father   . Diabetes Mellitus II Sister   . Colon cancer Sister 92       small, surgical excision; chemo/rad not needed  . Cancer - Other Brother   . Diabetes Mellitus II Brother   . Hypertension Brother   . Liver disease Neg Hx     CURRENT MEDICATIONS:  Outpatient Encounter Medications as of 01/06/2021  Medication Sig  . Accu-Chek Softclix Lancets lancets USE TO CHECK BLOOD SUGAR TWICE DAILY.  Marland Kitchen Cholecalciferol (VITAMIN D) 2000 units tablet Take 2,000 Units by mouth daily.  . cyanocobalamin 1000 MCG tablet Take 1,000 mcg by mouth every other day.  Marland Kitchen glipiZIDE (GLUCOTROL XL) 5 MG 24 hr tablet TAKE ONE TABLET BY MOUTH ONCE DAILY WITH BREAKFAST.  Marland Kitchen lisinopril (PRINIVIL,ZESTRIL) 10 MG tablet Take 10 mg by mouth daily.  Marland Kitchen NOVOFINE 32G X 6 MM MISC USE ONE AS DIRECTED TWICE DAILY.  . pantoprazole (PROTONIX) 40 MG tablet Take 1 tablet (40 mg total) by mouth 2 (two) times daily before a meal.  . propranolol (INDERAL) 10 MG tablet TAKE THREE (3) TABLETS BY MOUTH EVERY MORNING AND 3 TABLETS EVERY EVENING.  . TRESIBA FLEXTOUCH 200 UNIT/ML SOPN Inject 70 Units into the skin at bedtime.  Marland Kitchen ULTICARE PEN NEEDLES 31G X 5 MM MISC USE ONE AS DIRECTED TWICE DAILY.  Marland Kitchen VICTOZA 18 MG/3ML SOLN Inject 1.8 mg into the skin daily.   No facility-administered encounter medications on file as of 01/06/2021.    ALLERGIES:  Allergies  Allergen Reactions  . Codeine Nausea And Vomiting     PHYSICAL EXAM:  ECOG PERFORMANCE STATUS: 0 - Asymptomatic  There were no vitals filed for this visit. There were no vitals filed for this visit. Physical Exam Constitutional:      Appearance: Normal appearance. She is obese.  HENT:     Head: Normocephalic and atraumatic.     Mouth/Throat:     Mouth: Mucous membranes are moist.  Eyes:     Extraocular Movements: Extraocular movements intact.     Pupils: Pupils are equal, round, and reactive to light.   Cardiovascular:     Rate and Rhythm: Normal rate and regular rhythm.     Pulses: Normal pulses.     Heart sounds: Normal heart sounds.  Pulmonary:     Effort: Pulmonary effort is normal.     Breath sounds: Normal breath sounds.  Abdominal:     General: Bowel sounds are normal.     Palpations: Abdomen is soft.     Tenderness: There is no abdominal tenderness.     Comments: Palpable liver margin below the ribs  Musculoskeletal:        General: No swelling.     Right lower leg: No edema.     Left lower leg:  No edema.  Lymphadenopathy:     Cervical: No cervical adenopathy.  Skin:    General: Skin is warm and dry.  Neurological:     General: No focal deficit present.     Mental Status: She is alert and oriented to person, place, and time.  Psychiatric:        Mood and Affect: Mood normal.        Behavior: Behavior normal.      LABORATORY DATA:  I have reviewed the labs as listed.  CBC    Component Value Date/Time   WBC 3.8 (L) 12/02/2020 1031   RBC 3.88 12/02/2020 1031   HGB 12.2 12/02/2020 1031   HCT 35.5 (L) 12/02/2020 1031   PLT 49 (L) 12/02/2020 1031   MCV 91.5 12/02/2020 1031   MCH 31.4 12/02/2020 1031   MCHC 34.4 12/02/2020 1031   RDW 14.1 12/02/2020 1031   LYMPHSABS 0.9 12/02/2020 1031   MONOABS 0.3 12/02/2020 1031   EOSABS 0.1 12/02/2020 1031   BASOSABS 0.0 12/02/2020 1031   CMP Latest Ref Rng & Units 12/20/2020 12/02/2020 11/27/2020  Glucose 70 - 99 mg/dL 142(H) 253(H) 218(H)  BUN 8 - 23 mg/dL 20 21 20   Creatinine 0.44 - 1.00 mg/dL 1.79(H) 1.68(H) 1.52(H)  Sodium 135 - 145 mmol/L 139 138 138  Potassium 3.5 - 5.1 mmol/L 4.0 4.6 4.7  Chloride 98 - 111 mmol/L 109 108 109  CO2 22 - 32 mmol/L 22 22 21(L)  Calcium 8.9 - 10.3 mg/dL 9.0 8.9 9.0  Total Protein 6.5 - 8.1 g/dL 7.0 6.8 -  Total Bilirubin 0.3 - 1.2 mg/dL 3.7(H) 3.0(H) -  Alkaline Phos 38 - 126 U/L 91 84 -  AST 15 - 41 U/L 29 30 -  ALT 0 - 44 U/L 21 20 -    DIAGNOSTIC IMAGING:  I have  independently reviewed the relevant imaging and discussed with the patient.  ASSESSMENT: 1.  Iron deficiency anemia secondary to chronic intermittent GI bleeding -She has a history of GI bleeding related to portal gastropathy and gastroesophageal varices, has required multiple banding procedures in the past - Malabsorption of iron due to gastropathy as well as PPI use twice daily - Patient is not on oral iron supplementation - Requires intermittent IV Feraheme, most recently given in September 2021 - Most recent labs (12/02/2020) show ferritin 277, iron saturation 42%, hemoglobin 12.2 - Denies recent bleeding events, no melena or bright red blood per rectum  2.  Thrombocytopenia - Etiology is from hypersplenism/splenomegaly in the setting of NASH cirrhosis - Platelet count is more or less stable, usually stays around 50 - Most recent labs (12/02/2020) show platelets 49 - No mucosal bleeding or other signs of blood loss  3.  Vitamin B12 deficiency -Most recent labs (12/02/2020) show B12 1,109 - Patient is currently taking vitamin B12 every other day  4.  CKD stage III/IV - Creatinine stable at baseline, 1.68 on 12/02/2020 - Follows with Dr. Theador Hawthorne  5.  Cirrhosis secondary to NASH - Patient has slowly growing liver lesion and rising AFP level, monitored by [facility] - Considering possible radioembolization of liver lesion with [facility]  6.  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  Other history - PMH: Cirrhosis, chronic kidney disease, congestive heart failure, type 2  diabetes, hypertension - SOCIAL: Lifelong non-smoker.  No tobacco, alcohol, illicit drug use.  PLAN:  1.  Iron deficiency anemia secondary to chronic intermittent GI bleeding -She has a history of GI bleeding related to portal gastropathy and gastroesophageal varices, has required multiple banding procedures in the past - No indication for IV iron at this time  - Repeat labs and RTC in 4 months  2.  Thrombocytopenia - Labs from 12/02/2020 show platelets 49, more or less stable - We will continue to monitor with repeat labs in 4 months  3.  Vitamin B12 deficiency - Continue taking 3 x per week oral supplementation, no need for daily supplement at this time  PLAN SUMMARY & DISPOSITION: - Repeat labs in 4 months (CBC, CMP, ferritin, iron/TIBC, B12) - RTC in 4 months  All questions were answered. The patient knows to call the clinic with any problems, questions or concerns.  Medical decision making: Low  Time spent on visit: I spent 15 minutes counseling the patient face to face. The total time spent in the appointment was 20 minutes and more than 50% was on counseling.   [name], PA-C  01/03/21 1:36 PM

## 2021-01-06 ENCOUNTER — Inpatient Hospital Stay (HOSPITAL_COMMUNITY): Payer: Medicare PPO | Attending: Hematology | Admitting: Physician Assistant

## 2021-01-06 ENCOUNTER — Other Ambulatory Visit: Payer: Self-pay

## 2021-01-06 VITALS — BP 134/55 | HR 72 | Temp 97.9°F | Resp 18 | Wt 194.0 lb

## 2021-01-06 DIAGNOSIS — K2270 Barrett's esophagus without dysplasia: Secondary | ICD-10-CM | POA: Insufficient documentation

## 2021-01-06 DIAGNOSIS — Z800 Family history of malignant neoplasm of digestive organs: Secondary | ICD-10-CM | POA: Insufficient documentation

## 2021-01-06 DIAGNOSIS — K7581 Nonalcoholic steatohepatitis (NASH): Secondary | ICD-10-CM | POA: Diagnosis not present

## 2021-01-06 DIAGNOSIS — D500 Iron deficiency anemia secondary to blood loss (chronic): Secondary | ICD-10-CM | POA: Diagnosis not present

## 2021-01-06 DIAGNOSIS — I129 Hypertensive chronic kidney disease with stage 1 through stage 4 chronic kidney disease, or unspecified chronic kidney disease: Secondary | ICD-10-CM | POA: Diagnosis not present

## 2021-01-06 DIAGNOSIS — E538 Deficiency of other specified B group vitamins: Secondary | ICD-10-CM | POA: Diagnosis not present

## 2021-01-06 DIAGNOSIS — D696 Thrombocytopenia, unspecified: Secondary | ICD-10-CM | POA: Diagnosis not present

## 2021-01-06 DIAGNOSIS — K7460 Unspecified cirrhosis of liver: Secondary | ICD-10-CM | POA: Insufficient documentation

## 2021-01-06 DIAGNOSIS — Z79899 Other long term (current) drug therapy: Secondary | ICD-10-CM | POA: Insufficient documentation

## 2021-01-06 DIAGNOSIS — D509 Iron deficiency anemia, unspecified: Secondary | ICD-10-CM | POA: Diagnosis not present

## 2021-01-06 DIAGNOSIS — K449 Diaphragmatic hernia without obstruction or gangrene: Secondary | ICD-10-CM | POA: Diagnosis not present

## 2021-01-06 DIAGNOSIS — N184 Chronic kidney disease, stage 4 (severe): Secondary | ICD-10-CM | POA: Diagnosis not present

## 2021-01-06 DIAGNOSIS — R918 Other nonspecific abnormal finding of lung field: Secondary | ICD-10-CM | POA: Diagnosis not present

## 2021-01-06 NOTE — Patient Instructions (Signed)
[place] at [facility] Discharge Instructions  You were seen today by Tarri Abernethy PA-C for your iron deficiency, B12, and low platelets.  Your levels are stable, and we will re-check in 4 months.    LABS: Return in 4 months for repeat labs   OTHER TESTS: None  MEDICATIONS: No changes.  FOLLOW-UP APPOINTMENT: Continue follow-up at [facility].  Return to [facility] in 4 months.   Thank you for choosing [place] at [facility] to provide your oncology and hematology care.  To afford each patient quality time with our provider, please arrive at least 15 minutes before your scheduled appointment time.   If you have a lab appointment with the [facility] please come in thru the Main Entrance and check in at the main information desk.  You need to re-schedule your appointment should you arrive 10 or more minutes late.  We strive to give you quality time with our providers, and arriving late affects you and other patients whose appointments are after yours.  Also, if you no show three or more times for appointments you may be dismissed from the clinic at the providers discretion.     Again, thank you for choosing [facility].  Our hope is that these requests will decrease the amount of time that you wait before being seen by our physicians.       _____________________________________________________________  Should you have questions after your visit to [facility], please contact our office at [phone] and follow the prompts.  Our office hours are 8:00 a.m. and 4:30 p.m. Monday - Friday.  Please note that voicemails left after 4:00 p.m. may not be returned until the following business day.  We are closed weekends and major holidays.  You do have access to a nurse 24-7, just call the main number to the clinic [phone] and do not press any options, hold on the line and a nurse will answer the phone.    For  prescription refill requests, have your pharmacy contact our office and allow 72 hours.    Due to Covid, you will need to wear a mask upon entering the hospital. If you do not have a mask, a mask will be given to you at the Main Entrance upon arrival. For doctor visits, patients may have 1 support person age 35 or older with them. For treatment visits, patients can not have anyone with them due to social distancing guidelines and our immunocompromised population.

## 2021-01-11 ENCOUNTER — Other Ambulatory Visit: Payer: Self-pay | Admitting: "Endocrinology

## 2021-01-27 ENCOUNTER — Other Ambulatory Visit: Payer: Self-pay | Admitting: "Endocrinology

## 2021-02-04 DIAGNOSIS — E669 Obesity, unspecified: Secondary | ICD-10-CM | POA: Diagnosis not present

## 2021-02-04 DIAGNOSIS — Z79899 Other long term (current) drug therapy: Secondary | ICD-10-CM | POA: Diagnosis not present

## 2021-02-04 DIAGNOSIS — Z299 Encounter for prophylactic measures, unspecified: Secondary | ICD-10-CM | POA: Diagnosis not present

## 2021-02-04 DIAGNOSIS — Z7189 Other specified counseling: Secondary | ICD-10-CM | POA: Diagnosis not present

## 2021-02-04 DIAGNOSIS — Z1339 Encounter for screening examination for other mental health and behavioral disorders: Secondary | ICD-10-CM | POA: Diagnosis not present

## 2021-02-04 DIAGNOSIS — Z1331 Encounter for screening for depression: Secondary | ICD-10-CM | POA: Diagnosis not present

## 2021-02-04 DIAGNOSIS — R5383 Other fatigue: Secondary | ICD-10-CM | POA: Diagnosis not present

## 2021-02-04 DIAGNOSIS — E7800 Pure hypercholesterolemia, unspecified: Secondary | ICD-10-CM | POA: Diagnosis not present

## 2021-02-04 DIAGNOSIS — I10 Essential (primary) hypertension: Secondary | ICD-10-CM | POA: Diagnosis not present

## 2021-02-04 DIAGNOSIS — Z0000 Encounter for general adult medical examination without abnormal findings: Secondary | ICD-10-CM | POA: Diagnosis not present

## 2021-02-04 DIAGNOSIS — Z6834 Body mass index (BMI) 34.0-34.9, adult: Secondary | ICD-10-CM | POA: Diagnosis not present

## 2021-02-14 ENCOUNTER — Other Ambulatory Visit: Payer: Self-pay | Admitting: "Endocrinology

## 2021-02-14 ENCOUNTER — Other Ambulatory Visit: Payer: Self-pay | Admitting: Gastroenterology

## 2021-02-24 DIAGNOSIS — I868 Varicose veins of other specified sites: Secondary | ICD-10-CM | POA: Diagnosis not present

## 2021-02-24 DIAGNOSIS — K7460 Unspecified cirrhosis of liver: Secondary | ICD-10-CM | POA: Diagnosis not present

## 2021-02-24 DIAGNOSIS — I8500 Esophageal varices without bleeding: Secondary | ICD-10-CM | POA: Diagnosis not present

## 2021-02-24 DIAGNOSIS — R918 Other nonspecific abnormal finding of lung field: Secondary | ICD-10-CM | POA: Diagnosis not present

## 2021-02-24 DIAGNOSIS — Z79899 Other long term (current) drug therapy: Secondary | ICD-10-CM | POA: Diagnosis not present

## 2021-02-24 DIAGNOSIS — R161 Splenomegaly, not elsewhere classified: Secondary | ICD-10-CM | POA: Diagnosis not present

## 2021-02-24 DIAGNOSIS — K7581 Nonalcoholic steatohepatitis (NASH): Secondary | ICD-10-CM | POA: Diagnosis not present

## 2021-02-24 DIAGNOSIS — K8020 Calculus of gallbladder without cholecystitis without obstruction: Secondary | ICD-10-CM | POA: Diagnosis not present

## 2021-03-11 ENCOUNTER — Other Ambulatory Visit (HOSPITAL_COMMUNITY)
Admission: RE | Admit: 2021-03-11 | Discharge: 2021-03-11 | Disposition: A | Discharge status: Home / Self Care | Payer: Medicare PPO | Source: Ambulatory Visit | Attending: Nephrology | Admitting: Nephrology

## 2021-03-11 ENCOUNTER — Other Ambulatory Visit: Payer: Self-pay

## 2021-03-11 DIAGNOSIS — I10 Essential (primary) hypertension: Secondary | ICD-10-CM | POA: Insufficient documentation

## 2021-03-11 DIAGNOSIS — E1122 Type 2 diabetes mellitus with diabetic chronic kidney disease: Secondary | ICD-10-CM | POA: Diagnosis not present

## 2021-03-11 DIAGNOSIS — D696 Thrombocytopenia, unspecified: Secondary | ICD-10-CM | POA: Diagnosis not present

## 2021-03-11 DIAGNOSIS — D631 Anemia in chronic kidney disease: Secondary | ICD-10-CM | POA: Insufficient documentation

## 2021-03-11 DIAGNOSIS — E872 Acidosis: Secondary | ICD-10-CM | POA: Insufficient documentation

## 2021-03-11 DIAGNOSIS — N189 Chronic kidney disease, unspecified: Secondary | ICD-10-CM | POA: Insufficient documentation

## 2021-03-11 LAB — RENAL FUNCTION PANEL
Albumin: 3.6 g/dL (ref 3.5–5.0)
Anion gap: 6 (ref 5–15)
BUN: 16 mg/dL (ref 8–23)
CO2: 24 mmol/L (ref 22–32)
Calcium: 9 mg/dL (ref 8.9–10.3)
Chloride: 109 mmol/L (ref 98–111)
Creatinine, Ser: 1.55 mg/dL — ABNORMAL HIGH (ref 0.44–1.00)
GFR, Estimated: 36 mL/min — ABNORMAL LOW (ref >60)
Glucose, Bld: 184 mg/dL — ABNORMAL HIGH (ref 70–99)
Phosphorus: 3 mg/dL (ref 2.5–4.6)
Potassium: 4.4 mmol/L (ref 3.5–5.1)
Sodium: 139 mmol/L (ref 135–145)

## 2021-03-11 LAB — CBC
HCT: 35.9 % — ABNORMAL LOW (ref 36.0–46.0)
Hemoglobin: 12.1 g/dL (ref 12.0–15.0)
MCH: 31 pg (ref 26.0–34.0)
MCHC: 33.7 g/dL (ref 30.0–36.0)
MCV: 92.1 fL (ref 80.0–100.0)
Platelets: 50 10*3/uL — ABNORMAL LOW (ref 150–400)
RBC: 3.9 MIL/uL (ref 3.87–5.11)
RDW: 14.6 % (ref 11.5–15.5)
WBC: 4.2 10*3/uL (ref 4.0–10.5)
nRBC: 0 % (ref 0.0–0.2)

## 2021-03-11 LAB — PROTEIN / CREATININE RATIO, URINE
Creatinine, Urine: 345.34 mg/dL
Protein Creatinine Ratio: 0.06 mg/mg{Cre} (ref 0.00–0.15)
Total Protein, Urine: 19 mg/dL

## 2021-03-12 DIAGNOSIS — I10 Essential (primary) hypertension: Secondary | ICD-10-CM | POA: Diagnosis not present

## 2021-03-12 DIAGNOSIS — K7460 Unspecified cirrhosis of liver: Secondary | ICD-10-CM | POA: Diagnosis not present

## 2021-03-12 DIAGNOSIS — D696 Thrombocytopenia, unspecified: Secondary | ICD-10-CM | POA: Diagnosis not present

## 2021-03-12 DIAGNOSIS — N189 Chronic kidney disease, unspecified: Secondary | ICD-10-CM | POA: Diagnosis not present

## 2021-03-12 DIAGNOSIS — E211 Secondary hyperparathyroidism, not elsewhere classified: Secondary | ICD-10-CM | POA: Diagnosis not present

## 2021-03-12 DIAGNOSIS — E872 Acidosis: Secondary | ICD-10-CM | POA: Diagnosis not present

## 2021-03-12 DIAGNOSIS — E1122 Type 2 diabetes mellitus with diabetic chronic kidney disease: Secondary | ICD-10-CM | POA: Diagnosis not present

## 2021-03-18 ENCOUNTER — Other Ambulatory Visit: Payer: Self-pay | Admitting: "Endocrinology

## 2021-03-19 DIAGNOSIS — R911 Solitary pulmonary nodule: Secondary | ICD-10-CM | POA: Diagnosis not present

## 2021-03-19 DIAGNOSIS — K7581 Nonalcoholic steatohepatitis (NASH): Secondary | ICD-10-CM | POA: Diagnosis not present

## 2021-03-19 DIAGNOSIS — K7689 Other specified diseases of liver: Secondary | ICD-10-CM | POA: Diagnosis not present

## 2021-03-19 DIAGNOSIS — N289 Disorder of kidney and ureter, unspecified: Secondary | ICD-10-CM | POA: Diagnosis not present

## 2021-03-19 DIAGNOSIS — K7460 Unspecified cirrhosis of liver: Secondary | ICD-10-CM | POA: Diagnosis not present

## 2021-03-19 DIAGNOSIS — Z794 Long term (current) use of insulin: Secondary | ICD-10-CM | POA: Diagnosis not present

## 2021-03-19 DIAGNOSIS — R160 Hepatomegaly, not elsewhere classified: Secondary | ICD-10-CM | POA: Diagnosis not present

## 2021-03-19 DIAGNOSIS — Z79899 Other long term (current) drug therapy: Secondary | ICD-10-CM | POA: Diagnosis not present

## 2021-03-19 DIAGNOSIS — R772 Abnormality of alphafetoprotein: Secondary | ICD-10-CM | POA: Diagnosis not present

## 2021-04-02 DIAGNOSIS — H47093 Other disorders of optic nerve, not elsewhere classified, bilateral: Secondary | ICD-10-CM | POA: Diagnosis not present

## 2021-04-07 DIAGNOSIS — K7460 Unspecified cirrhosis of liver: Secondary | ICD-10-CM | POA: Diagnosis not present

## 2021-04-07 DIAGNOSIS — K769 Liver disease, unspecified: Secondary | ICD-10-CM | POA: Diagnosis not present

## 2021-04-07 DIAGNOSIS — I8510 Secondary esophageal varices without bleeding: Secondary | ICD-10-CM | POA: Diagnosis not present

## 2021-04-07 DIAGNOSIS — K766 Portal hypertension: Secondary | ICD-10-CM | POA: Diagnosis not present

## 2021-04-07 DIAGNOSIS — K7689 Other specified diseases of liver: Secondary | ICD-10-CM | POA: Diagnosis not present

## 2021-04-10 ENCOUNTER — Other Ambulatory Visit: Payer: Self-pay | Admitting: "Endocrinology

## 2021-04-18 ENCOUNTER — Ambulatory Visit: Payer: Medicare PPO | Admitting: Internal Medicine

## 2021-04-29 ENCOUNTER — Ambulatory Visit: Payer: Medicare PPO | Admitting: "Endocrinology

## 2021-04-29 ENCOUNTER — Encounter: Payer: Self-pay | Admitting: "Endocrinology

## 2021-04-29 ENCOUNTER — Other Ambulatory Visit: Payer: Self-pay

## 2021-04-29 VITALS — BP 118/54 | HR 72 | Ht 61.0 in | Wt 198.8 lb

## 2021-04-29 DIAGNOSIS — E559 Vitamin D deficiency, unspecified: Secondary | ICD-10-CM

## 2021-04-29 DIAGNOSIS — N183 Chronic kidney disease, stage 3 unspecified: Secondary | ICD-10-CM

## 2021-04-29 DIAGNOSIS — E1165 Type 2 diabetes mellitus with hyperglycemia: Secondary | ICD-10-CM | POA: Diagnosis not present

## 2021-04-29 DIAGNOSIS — E1122 Type 2 diabetes mellitus with diabetic chronic kidney disease: Secondary | ICD-10-CM

## 2021-04-29 DIAGNOSIS — I10 Essential (primary) hypertension: Secondary | ICD-10-CM

## 2021-04-29 DIAGNOSIS — IMO0002 Reserved for concepts with insufficient information to code with codable children: Secondary | ICD-10-CM

## 2021-04-29 LAB — POCT GLYCOSYLATED HEMOGLOBIN (HGB A1C): HbA1c, POC (controlled diabetic range): 8.7 % — AB (ref 0.0–7.0)

## 2021-04-29 MED ORDER — FREESTYLE LIBRE 2 READER DEVI: 0 refills | Status: DC

## 2021-04-29 MED ORDER — NOVOLOG FLEXPEN 100 UNIT/ML ~~LOC~~ SOPN: 8.0000 [IU] | PEN_INJECTOR | Freq: Three times a day (TID) | SUBCUTANEOUS | 2 refills | Status: DC

## 2021-04-29 MED ORDER — FREESTYLE LIBRE 2 SENSOR MISC: 1.0000 | 3 refills | Status: DC

## 2021-04-29 NOTE — Progress Notes (Signed)
04/29/2021, 3:10 PM  Endocrinology follow-up note   Subjective:    Patient ID: Alisha [name], female    DOB: 06/19/1951.  Alisha [name] is being seen in follow-up after she was seen in consultation for management of currently uncontrolled symptomatic diabetes requested by  Glenda Chroman, MD.   Past Medical History:  Diagnosis Date   B12 deficiency 11/03/2016   B12 deficiency 11/03/2016   Barrett's esophagus    Cirrhosis of liver without mention of alcohol    hep B surface antigen and HCV ab negative.pt has not had hepatitis A and B vaccines.U/S on 07/04/13 shows cirrhosis.   Diverticula of colon    pancolonic   Esophageal reflux    Esophageal varices (HCC)    Hiatal hernia    Iron deficiency anemia, unspecified    Other and unspecified hyperlipidemia    Portal hypertensive gastropathy (HCC)    Tubular adenoma    Type II or unspecified type diabetes mellitus without mention of complication, not stated as uncontrolled    Unspecified essential hypertension    Unspecified hemorrhoids without mention of complication     Past Surgical History:  Procedure Laterality Date   ABDOMINAL HYSTERECTOMY     BIOPSY  04/30/2016   Procedure: BIOPSY;  Surgeon: Daneil Dolin, MD;  [facility];  Service: Endoscopy;;  esophagus   BIOPSY  02/24/2019   Procedure: BIOPSY;  Surgeon: Daneil Dolin, MD;  [facility];  Service: Endoscopy;;   CESAREAN SECTION     x 2   COLONOSCOPY  03/2006   left sided diverticula, splenic flexure tublar adenoma   COLONOSCOPY  07/2002   villous tubular adenoma in rectum   COLONOSCOPY  09/23/09   external hemorrhoids/scattered pan colonic diverticula otherwise normal. cecal lipoma bx negative, normal TI. Next TCS 09/2014   COLONOSCOPY N/A 11/12/2014   Procedure: COLONOSCOPY;  Surgeon: Daneil Dolin, MD;  [facility];  Service: Endoscopy;   Laterality: N/A;  1215 - moved to 12:30 - Ginger to notify pt   COLONOSCOPY N/A 01/07/2017   Rourk: Grade 4 internal hemorrhoids, 5 mm ulcer in the sigmoid colon (benign bx), 7 mm polyp removed from the cecum (tubular adenoma), diverticulosis.next tcs in five years   ESOPHAGEAL BANDING N/A 12/20/2016   Procedure: ESOPHAGEAL BANDING;  Surgeon: Danie Binder, MD;  [facility];  Service: Endoscopy;  Laterality: N/A;   ESOPHAGEAL BANDING N/A 03/18/2017   Procedure: ESOPHAGEAL BANDING;  Surgeon: Daneil Dolin, MD;  [facility];  Service: Endoscopy;  Laterality: N/A;   ESOPHAGEAL BANDING  12/17/2017   Procedure: ESOPHAGEAL BANDING;  Surgeon: Daneil Dolin, MD;  [facility];  Service: Endoscopy;;   ESOPHAGEAL BANDING N/A 07/03/2020   Procedure: ESOPHAGEAL BANDING;  Surgeon: Daneil Dolin, MD;  [facility];  Service: Endoscopy;  Laterality: N/A;   ESOPHAGOGASTRODUODENOSCOPY  08/2006   barretts esophagus, no dyplasia   ESOPHAGOGASTRODUODENOSCOPY  03/2006   barretts esophagus,bx focal atypia c/w low grade dysplasia, SB bx negative for celiac   ESOPHAGOGASTRODUODENOSCOPY  09/23/09   3 columns of grade 2 esophageal varices/hiatal  hernia/3-4 cm segment Barrett's esophagus without dysplasia. Next EGD 09/2012   ESOPHAGOGASTRODUODENOSCOPY N/A 11/11/2012   Dr. Gala Romney- Barrett;s esophagus, esophageal varices, portal gastopathy. hiatal hernia   ESOPHAGOGASTRODUODENOSCOPY N/A 04/30/2016   Procedure: ESOPHAGOGASTRODUODENOSCOPY (EGD);  Surgeon: Daneil Dolin, MD;  [facility];  Service: Endoscopy;  Laterality: N/A;  815   ESOPHAGOGASTRODUODENOSCOPY N/A 12/20/2016   Procedure: ESOPHAGOGASTRODUODENOSCOPY (EGD);  Surgeon: Danie Binder, MD;  [facility];  Service: Endoscopy;  Laterality: N/A;   ESOPHAGOGASTRODUODENOSCOPY N/A 01/07/2017   Procedure: ESOPHAGOGASTRODUODENOSCOPY (EGD);  Surgeon: Daneil Dolin, MD;  [facility];  Service:  Endoscopy;  Laterality: N/A;   ESOPHAGOGASTRODUODENOSCOPY N/A 03/18/2017   Procedure: ESOPHAGOGASTRODUODENOSCOPY (EGD);  Surgeon: Daneil Dolin, MD;  [facility];  Service: Endoscopy;  Laterality: N/A;  1030    ESOPHAGOGASTRODUODENOSCOPY N/A 12/17/2017   Procedure: ESOPHAGOGASTRODUODENOSCOPY (EGD);  Surgeon: Daneil Dolin, MD;  [facility];  Service: Endoscopy;  Laterality: N/A;  10:30am   ESOPHAGOGASTRODUODENOSCOPY N/A 02/24/2019   Rourk: No esophageal varices seen, scars noted from prior banding.  5 cm segment of salmon-colored epithelium coming up from the GE junction consistent with Barrett's (bx with no dysplasia).  Portal gastropathy.  Small hiatal hernia.  Recommended repeat EGD in 18 months.   ESOPHAGOGASTRODUODENOSCOPY N/A 07/03/2020   Procedure: ESOPHAGOGASTRODUODENOSCOPY (EGD);  Surgeon: Daneil Dolin, MD;  [facility];  Service: Endoscopy;  Laterality: N/A;  10:15am   HEMORRHOID SURGERY  2008   small bowel capsule endoscopy  10/2009   normal    Social History   Socioeconomic History   Marital status: Married    Spouse name: Not on file   Number of children: Not on file   Years of education: Not on file   Highest education level: Not on file  Occupational History   Not on file  Tobacco Use   Smoking status: Never   Smokeless tobacco: Never  Vaping Use   Vaping Use: Never used  Substance and Sexual Activity   Alcohol use: No   Drug use: No   Sexual activity: Yes  Other Topics Concern   Not on file  Social History Narrative   Not on file   Social Determinants of Health   Financial Resource Strain: Not on file  Food Insecurity: Not on file  Transportation Needs: Not on file  Physical Activity: Not on file  Stress: Not on file  Social Connections: Not on file    Family History  Problem Relation Age of Onset   Alzheimer's disease Mother    CAD Father    Diabetes Mellitus II Father    Alzheimer's disease Father     Diabetes Mellitus II Sister    Colon cancer Sister 64       small, surgical excision; chemo/rad not needed   Cancer - Other Brother    Diabetes Mellitus II Brother    Hypertension Brother    Liver disease Neg Hx     Outpatient Encounter Medications as of 04/29/2021  Medication Sig   Continuous Blood Gluc Receiver (FREESTYLE LIBRE 2 READER) DEVI As directed   Continuous Blood Gluc Sensor (FREESTYLE LIBRE 2 SENSOR) MISC 1 Piece by Does not apply route every 14 (fourteen) days.   insulin aspart (NOVOLOG FLEXPEN) 100 UNIT/ML FlexPen Inject 8-14 Units into the skin 3 (three) times daily with meals.   Accu-Chek Softclix Lancets lancets USE TO CHECK BLOOD SUGAR TWICE DAILY.   Cholecalciferol (VITAMIN D) 2000 units tablet  Take 2,000 Units by mouth daily.   cyanocobalamin 1000 MCG tablet Take 1,000 mcg by mouth every other day.   glipiZIDE (GLUCOTROL XL) 5 MG 24 hr tablet TAKE ONE TABLET BY MOUTH ONCE DAILY WITH BREAKFAST.   lisinopril (PRINIVIL,ZESTRIL) 10 MG tablet Take 10 mg by mouth daily.   NOVOFINE 32G X 6 MM MISC USE ONE AS DIRECTED TWICE DAILY.   pantoprazole (PROTONIX) 40 MG tablet Take 1 tablet (40 mg total) by mouth 2 (two) times daily before a meal.   propranolol (INDERAL) 10 MG tablet TAKE THREE (3) TABLETS BY MOUTH EVERY MORNING AND TAKE THREE (3) TABLETS BY MOUTH EVERY EVENING   TRESIBA FLEXTOUCH 200 UNIT/ML FlexTouch Pen INJECT 70 UNITS INTO THE SKIN AT BEDTIME   ULTICARE PEN NEEDLES 31G X 5 MM MISC USE ONE AS DIRECTED TWICE DAILY.   VICTOZA 18 MG/3ML SOLN Inject 1.8 mg into the skin daily.   No facility-administered encounter medications on file as of 04/29/2021.    ALLERGIES: Allergies  Allergen Reactions   Codeine Nausea And Vomiting    VACCINATION STATUS:  There is no immunization history on file for this patient.  Diabetes She presents for her follow-up diabetic visit. She has type 2 diabetes mellitus. Onset time: She was diagnosed at approximate age of 31 years. Her  disease course has been worsening. There are no hypoglycemic associated symptoms. Pertinent negatives for hypoglycemia include no confusion, headaches, pallor or seizures. Pertinent negatives for diabetes include no chest pain, no fatigue, no polydipsia, no polyphagia and no polyuria. (She did not bring any logs nor meter to review.  Patient verbally reports that she is having fluctuating glycemic profile including hypoglycemia.) There are no hypoglycemic complications. Symptoms are worsening. Diabetic complications include nephropathy. Risk factors for coronary artery disease include diabetes mellitus, hypertension, obesity, post-menopausal and family history. Current diabetic treatments: She is currently on Tresiba 52 units nightly, Victoza 1.8 mg subcutaneously daily, glipizide 10 mg daily. Her weight is increasing steadily. She is following a generally unhealthy diet. When asked about meal planning, she reported none. She has had a previous visit with a dietitian. She participates in exercise intermittently. Her home blood glucose trend is increasing steadily. Her breakfast blood glucose range is generally 140-180 mg/dl. Her bedtime blood glucose range is generally >200 mg/dl. Her overall blood glucose range is >200 mg/dl. (She presents with controlled fasting glycemic profile, however significantly above target postprandial glucose readings.  Her point-of-care A1c is 8.7%, unchanged from her last visit A1c.  No hypoglycemia documented or reported.     ) An ACE inhibitor/angiotensin II receptor blocker is being taken. Eye exam is current.  Hypertension This is a chronic problem. The current episode started more than 1 year ago. The problem is controlled. Pertinent negatives include no chest pain, headaches, palpitations or shortness of breath. Risk factors for coronary artery disease include diabetes mellitus. Past treatments include ACE inhibitors. Hypertensive end-organ damage includes kidney disease.  Identifiable causes of hypertension include chronic renal disease.    Review of Systems  Constitutional:  Negative for chills, fatigue, fever and unexpected weight change.  HENT:  Negative for trouble swallowing and voice change.   Eyes:  Negative for visual disturbance.  Respiratory:  Negative for cough, shortness of breath and wheezing.   Cardiovascular:  Negative for chest pain, palpitations and leg swelling.  Gastrointestinal:  Negative for diarrhea, nausea and vomiting.  Endocrine: Negative for cold intolerance, heat intolerance, polydipsia, polyphagia and polyuria.  Musculoskeletal:  Negative for arthralgias  and myalgias.  Skin:  Negative for color change, pallor, rash and wound.  Neurological:  Negative for seizures and headaches.  Psychiatric/Behavioral:  Negative for confusion and suicidal ideas.    Objective:    Vitals with BMI 04/29/2021 01/06/2021 12/23/2020  Height 5' 1"  - 5' 1"   Weight 198 lbs 13 oz 194 lbs 192 lbs 10 oz  BMI 37.58 17.61 60.73  Systolic 710 626 948  Diastolic 54 55 69  Pulse 72 72 76    BP (!) 118/54   Pulse 72   Ht 5' 1"  (1.549 m)   Wt 198 lb 12.8 oz (90.2 kg)   BMI 37.56 kg/m   Wt Readings from Last 3 Encounters:  04/29/21 198 lb 12.8 oz (90.2 kg)  01/06/21 194 lb (88 kg)  12/23/20 192 lb 9.6 oz (87.4 kg)     Physical Exam Constitutional:      Appearance: She is well-developed.  HENT:     Head: Normocephalic and atraumatic.  Neck:     Thyroid: No thyromegaly.     Trachea: No tracheal deviation.  Cardiovascular:     Rate and Rhythm: Normal rate and regular rhythm.  Pulmonary:     Effort: Pulmonary effort is normal.  Abdominal:     Tenderness: There is no abdominal tenderness. There is no guarding.  Musculoskeletal:        General: Normal range of motion.     Cervical back: Normal range of motion and neck supple.     Comments: Her foot exam was normal today.  Skin:    General: Skin is warm and dry.     Coloration: Skin is not  pale.     Findings: No erythema or rash.  Neurological:     Mental Status: She is alert and oriented to person, place, and time.     Cranial Nerves: No cranial nerve deficit.     Coordination: Coordination normal.     Deep Tendon Reflexes: Reflexes are normal and symmetric.  Psychiatric:        Judgment: Judgment normal.      CMP ( most recent) CMP     Component Value Date/Time   NA 139 03/11/2021 1204   K 4.4 03/11/2021 1204   CL 109 03/11/2021 1204   CO2 24 03/11/2021 1204   GLUCOSE 184 (H) 03/11/2021 1204   BUN 16 03/11/2021 1204   BUN 15 01/25/2020 0000   CREATININE 1.55 (H) 03/11/2021 1204   CREATININE 1.62 (H) 03/28/2018 1146   CALCIUM 9.0 03/11/2021 1204   CALCIUM 9.1 01/25/2020 1059   PROT 7.0 12/20/2020 1511   ALBUMIN 3.6 03/11/2021 1204   AST 29 12/20/2020 1511   ALT 21 12/20/2020 1511   ALKPHOS 91 12/20/2020 1511   BILITOT 3.7 (H) 12/20/2020 1511   GFRNONAA 36 (L) 03/11/2021 1204   GFRAA 36 (L) 05/29/2020 1416     Diabetic Labs (most recent): Lab Results  Component Value Date   HGBA1C 8.7 (A) 04/29/2021   HGBA1C 8.8 (A) 12/23/2020   HGBA1C 8.5 (A) 08/21/2020     Lipid Panel ( most recent) Lipid Panel     Component Value Date/Time   CHOL 157 12/20/2020 1511   TRIG 121 12/20/2020 1511   HDL 42 12/20/2020 1511   CHOLHDL 3.7 12/20/2020 1511   VLDL 24 12/20/2020 1511   LDLCALC 91 12/20/2020 1511      Lab Results  Component Value Date   TSH 2.531 12/20/2020   TSH 2.46 01/25/2020  FREET4 0.97 12/20/2020      Assessment & Plan:   1. Uncontrolled type 2 diabetes mellitus with stage 3 chronic kidney disease ([place])  - Alisha Gobble Mccarrick has currently uncontrolled symptomatic type 2 DM since 70 years of age.  She presents with controlled fasting glycemic profile, however significantly above target postprandial glucose readings.  Her point-of-care A1c is 8.7%, unchanged from her last visit A1c.  No hypoglycemia documented or reported.      Recent labs reviewed. - I had a long discussion with her about the progressive nature of diabetes and the pathology behind its complications. -her diabetes is complicated by CKD and she remains at a high risk for more acute and chronic complications which include CAD, CVA, CKD, retinopathy, and neuropathy. These are all discussed in detail with her.  - I have counseled her on diet  and weight management  by adopting a carbohydrate restricted/protein rich diet. Patient is encouraged to switch to  unprocessed or minimally processed     complex starch and increased protein intake (animal or plant source), fruits, and vegetables. -  she is advised to stick to a routine mealtimes to eat 3 meals  a day and avoid unnecessary snacks ( to snack only to correct hypoglycemia).   - she acknowledges that there is a room for improvement in her food and drink choices. - Suggestion is made for her to avoid simple carbohydrates  from her diet including Cakes, Sweet Desserts, Ice Cream, Soda (diet and regular), Sweet Tea, Candies, Chips, Cookies, Store Bought Juices, Alcohol in Excess of  1-2 drinks a day, Artificial Sweeteners,  Coffee Creamer, and "Sugar-free" Products, Lemonade. This will help patient to have more stable blood glucose profile and potentially avoid unintended weight gain.   - she will be scheduled with Jearld Fenton, RDN, CDE for diabetes education.  - I have approached her with the following individualized plan to manage  her diabetes and patient agrees:   - she has required multiple daily injections of insulin to control diabetes in the past.   -Based on her presentation with near target glycemic profile, she will need multiple daily injections of insulin in order for her to achieve control of diabetes to target.    She is accepting prandial insulin.  I advised her to continue Tresiba 70 units nightly, discussed and initiated NovoLog 8 units 3 times daily AC for Premeal blood glucose readings  above 90 mg per DL, associated with monitoring of blood glucose 4 times a day.  This patient would benefit from a CGM.  I discussed and prescribed the freestyle libre device for her.   - she is warned not to take insulin without proper monitoring per orders. - she is encouraged to call clinic for blood glucose levels less than 70 or above 200 mg /dl. - she is advised to continue Victoza 1.8 mg of 3 CBC, glipizide 5 mg XL p.o. daily at breakfast.    - she is not a candidate for Metformin, SGLT2 inhibitors due to concurrent renal insufficiency.  - Specific targets for  A1c;  LDL, HDL,  and Triglycerides were discussed with the patient.  2) Blood Pressure /Hypertension:  Her blood pressure is controlled to target. she is advised to continue her current medications including lisinopril 10 mg p.o. daily with breakfast .   3) Lipids/Hyperlipidemia:   Review of her recent lipid panel showed  controlled uncontrolled LDL at 91.    she  is not on statins.  She  will be considered for fasting lipid panel and low-dose statins on subsequent visits.    4)  Weight/Diet:  Body mass index is 37.56 kg/m.  -   clearly complicating her diabetes care.   she is  a candidate for weight loss. I discussed with her the fact that loss of 5 - 10% of her  current body weight will have the most impact on her diabetes management.  Exercise, and detailed carbohydrates information provided  -  detailed on discharge instructions.  5) Chronic Care/Health Maintenance:  -she  is on ACEI Statin medications and  is encouraged to initiate and continue to follow up with Ophthalmology, Dentist,  Podiatrist at least yearly or according to recommendations, and advised to   stay away from smoking. I have recommended yearly flu vaccine and pneumonia vaccine at least every 5 years; moderate intensity exercise for up to 150 minutes weekly; and  sleep for at least 7 hours a day.  6.  Vitamin D deficiency: She is advised to continue  cholecalciferol 2000 units daily.   She recently had normal ABI, will be repeated in 5 years.  - she is  advised to maintain close follow up with Glenda Chroman, MD for primary care needs, as well as her other providers for optimal and coordinated care.    I spent 42 minutes in the care of the patient today including review of labs from [facility], Lipids, Thyroid Function, Hematology (current and previous including abstractions from other facilities); face-to-face time discussing  her blood glucose readings/logs, discussing hypoglycemia and hyperglycemia episodes and symptoms, medications doses, her options of short and long term treatment based on the latest standards of care / guidelines;  discussion about incorporating lifestyle medicine;  and documenting the encounter.    Please refer to Patient Instructions for Blood Glucose Monitoring and Insulin/Medications Dosing Guide"  in media tab for additional information. Please  also refer to " Patient Self Inventory" in the Media  tab for reviewed elements of pertinent patient history.  Alisha Gobble Roundtree participated in the discussions, expressed understanding, and voiced agreement with the above plans.  All questions were answered to her satisfaction. she is encouraged to contact clinic should she have any questions or concerns prior to her return visit.     Follow up plan: - Return in about 2 weeks (around 05/13/2021) for F/U with Meter and Logs Only - no Labs.  Glade Lloyd, MD [facility] [address] [place], [place] [address] Phone: [phone]  Fax: [phone]    04/29/2021, 3:10 PM  This note was partially dictated with voice recognition software. Similar sounding words can be transcribed inadequately or may not  be corrected upon review.

## 2021-04-29 NOTE — Patient Instructions (Signed)

## 2021-05-06 ENCOUNTER — Inpatient Hospital Stay (HOSPITAL_COMMUNITY): Payer: Medicare PPO

## 2021-05-07 ENCOUNTER — Inpatient Hospital Stay (HOSPITAL_COMMUNITY): Payer: Medicare PPO | Attending: Hematology

## 2021-05-07 ENCOUNTER — Other Ambulatory Visit: Payer: Self-pay | Admitting: "Endocrinology

## 2021-05-07 ENCOUNTER — Telehealth: Payer: Self-pay | Admitting: "Endocrinology

## 2021-05-07 ENCOUNTER — Other Ambulatory Visit: Payer: Self-pay

## 2021-05-07 DIAGNOSIS — D509 Iron deficiency anemia, unspecified: Secondary | ICD-10-CM | POA: Insufficient documentation

## 2021-05-07 DIAGNOSIS — E538 Deficiency of other specified B group vitamins: Secondary | ICD-10-CM

## 2021-05-07 DIAGNOSIS — Z79899 Other long term (current) drug therapy: Secondary | ICD-10-CM | POA: Insufficient documentation

## 2021-05-07 DIAGNOSIS — D696 Thrombocytopenia, unspecified: Secondary | ICD-10-CM

## 2021-05-07 DIAGNOSIS — D500 Iron deficiency anemia secondary to blood loss (chronic): Secondary | ICD-10-CM

## 2021-05-07 LAB — FERRITIN: Ferritin: 250 ng/mL (ref 11–307)

## 2021-05-07 LAB — CBC WITH DIFFERENTIAL/PLATELET
Abs Immature Granulocytes: 0.01 10*3/uL (ref 0.00–0.07)
Basophils Absolute: 0 10*3/uL (ref 0.0–0.1)
Basophils Relative: 1 %
Eosinophils Absolute: 0.1 10*3/uL (ref 0.0–0.5)
Eosinophils Relative: 3 %
HCT: 34.5 % — ABNORMAL LOW (ref 36.0–46.0)
Hemoglobin: 11.9 g/dL — ABNORMAL LOW (ref 12.0–15.0)
Immature Granulocytes: 0 %
Lymphocytes Relative: 23 %
Lymphs Abs: 0.9 10*3/uL (ref 0.7–4.0)
MCH: 31.6 pg (ref 26.0–34.0)
MCHC: 34.5 g/dL (ref 30.0–36.0)
MCV: 91.5 fL (ref 80.0–100.0)
Monocytes Absolute: 0.3 10*3/uL (ref 0.1–1.0)
Monocytes Relative: 7 %
Neutro Abs: 2.7 10*3/uL (ref 1.7–7.7)
Neutrophils Relative %: 66 %
Platelets: 48 10*3/uL — ABNORMAL LOW (ref 150–400)
RBC: 3.77 MIL/uL — ABNORMAL LOW (ref 3.87–5.11)
RDW: 14.5 % (ref 11.5–15.5)
WBC: 4 10*3/uL (ref 4.0–10.5)
nRBC: 0 % (ref 0.0–0.2)

## 2021-05-07 LAB — COMPREHENSIVE METABOLIC PANEL
ALT: 19 U/L (ref 0–44)
AST: 31 U/L (ref 15–41)
Albumin: 3.5 g/dL (ref 3.5–5.0)
Alkaline Phosphatase: 100 U/L (ref 38–126)
Anion gap: 3 — ABNORMAL LOW (ref 5–15)
BUN: 17 mg/dL (ref 8–23)
CO2: 24 mmol/L (ref 22–32)
Calcium: 8.5 mg/dL — ABNORMAL LOW (ref 8.9–10.3)
Chloride: 110 mmol/L (ref 98–111)
Creatinine, Ser: 1.54 mg/dL — ABNORMAL HIGH (ref 0.44–1.00)
GFR, Estimated: 36 mL/min — ABNORMAL LOW (ref >60)
Glucose, Bld: 249 mg/dL — ABNORMAL HIGH (ref 70–99)
Potassium: 4.3 mmol/L (ref 3.5–5.1)
Sodium: 137 mmol/L (ref 135–145)
Total Bilirubin: 3.4 mg/dL — ABNORMAL HIGH (ref 0.3–1.2)
Total Protein: 6.5 g/dL (ref 6.5–8.1)

## 2021-05-07 LAB — VITAMIN B12: Vitamin B-12: 1262 pg/mL — ABNORMAL HIGH (ref 180–914)

## 2021-05-07 LAB — IRON AND TIBC
Iron: 127 ug/dL (ref 28–170)
Saturation Ratios: 49 % — ABNORMAL HIGH (ref 10.4–31.8)
TIBC: 257 ug/dL (ref 250–450)
UIBC: 130 ug/dL

## 2021-05-07 MED ORDER — ACCU-CHEK SOFTCLIX LANCETS MISC: 2 refills | Status: AC

## 2021-05-07 NOTE — Telephone Encounter (Signed)
Rx sent 

## 2021-05-07 NOTE — Telephone Encounter (Signed)
[name] called from [facility], she states the patient is requesting accucheck lancets and statse the patient told them she was testing 2x a day but our office told her to change it to 5x a day therefore she is needing a new rx. I also see the freestyle Alisha [name] was called in 8/16 and they said she did not get that filled. Please advise.

## 2021-05-08 DIAGNOSIS — E1165 Type 2 diabetes mellitus with hyperglycemia: Secondary | ICD-10-CM | POA: Diagnosis not present

## 2021-05-09 DIAGNOSIS — E1165 Type 2 diabetes mellitus with hyperglycemia: Secondary | ICD-10-CM | POA: Diagnosis not present

## 2021-05-13 ENCOUNTER — Ambulatory Visit (HOSPITAL_COMMUNITY): Payer: Medicare PPO | Admitting: Physician Assistant

## 2021-05-14 NOTE — Progress Notes (Signed)
Virtual Visit via Telephone Note [facility]  I connected with Alisha [name]  on 05/15/21  at  10:34 AM by telephone and verified that I am speaking with the correct person using two identifiers.  Location: Patient: Home Provider: [facility]   I discussed the limitations, risks, security and privacy concerns of performing an evaluation and management service by telephone and the availability of in person appointments. I also discussed with the patient that there may be a patient responsible charge related to this service. The patient expressed understanding and agreed to proceed.   HISTORY OF PRESENT ILLNESS: [name] 70 y.o. 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  She requires intermittent IV iron infusions, last given in September 2021.  She was last seen in office by Tarri Abernethy PA-C on 01/06/2021.  She reports that she has been doing fairly well since her last visit.  She denies any recent infections, hospitalizations, new diagnoses, or changes in her baseline health status.  She has a history of bleeding from esophageal varices, but denies any recent signs or symptoms of bleeding such as hematemesis, hemoptysis, hematochezia, or melena.  She reports mild bruising but denies petechial rash.  Mild fatigue (energy 80%), but no chest pain, dyspnea, palpitations, or syncope.  She denies any B symptoms such as fever, chills, night sweats, unintentional weight loss.  No recent infections or hospitalizations.  She reports 50% appetite and energy level at 80%.  She is maintaining stable weight at this time.    OBSERVATIONS/OBJECTIVE: Review of Systems  Constitutional:  Positive for malaise/fatigue (energy 80%). Negative for chills, diaphoresis, fever and weight loss.  Respiratory:  Negative for cough and shortness of breath.   Cardiovascular:  Negative for chest pain and  palpitations.  Gastrointestinal:  Negative for abdominal pain, blood in stool, melena, nausea and vomiting.  Neurological:  Negative for dizziness and headaches.    PHYSICAL EXAM (per limitations of virtual telephone visit): The patient is alert and oriented x 3, exhibiting adequate mentation, good mood, and ability to speak in full sentences and execute sound judgement.   ASSESSMENT & PLAN: 1.  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  Repeat labs and RTC in 4 months, or sooner if indicated.  2.  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  3.  Vitamin B12 deficiency -Most recent labs (05/07/2021) show B12 at 1262 - Patient is currently taking vitamin B12 every other day - PLAN: Continue B12 supplement 3 times per week, no need for daily supplementation at this time  4.  CKD stage III/IV - Creatinine stable at baseline, 1.54 on 05/07/2021 - Follows with Dr. Theador Hawthorne   5.  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  Other history - PMH: Cirrhosis, chronic kidney disease, congestive heart failure, type 2 diabetes, hypertension - SOCIAL: Lifelong non-smoker.  No tobacco, alcohol, illicit drug use.   FOLLOW UP INSTRUCTIONS: - Labs in 4 months (CBC, CMP, B12, methylmalonic acid, ferritin, iron/TIBC) - Office visit after labs    I discussed the assessment and treatment plan with the patient. The patient was provided an opportunity to ask questions and all were answered. The patient agreed with the plan and demonstrated an understanding of the instructions.   The patient was advised to call back or seek an in-person evaluation if the symptoms worsen or if the condition fails to improve as anticipated.  I provided 13 minutes of non-face-to-face time during this encounter.   [name], PA-C 05/15/21 10:50 AM

## 2021-05-15 ENCOUNTER — Other Ambulatory Visit: Payer: Self-pay

## 2021-05-15 ENCOUNTER — Encounter (HOSPITAL_COMMUNITY): Payer: Self-pay | Admitting: Physician Assistant

## 2021-05-15 ENCOUNTER — Inpatient Hospital Stay (HOSPITAL_COMMUNITY): Payer: Medicare PPO | Attending: Physician Assistant | Admitting: Physician Assistant

## 2021-05-15 VITALS — Wt 195.0 lb

## 2021-05-15 DIAGNOSIS — D500 Iron deficiency anemia secondary to blood loss (chronic): Secondary | ICD-10-CM

## 2021-05-15 DIAGNOSIS — E538 Deficiency of other specified B group vitamins: Secondary | ICD-10-CM | POA: Diagnosis not present

## 2021-05-15 DIAGNOSIS — D696 Thrombocytopenia, unspecified: Secondary | ICD-10-CM

## 2021-05-16 ENCOUNTER — Ambulatory Visit: Payer: Medicare PPO | Admitting: "Endocrinology

## 2021-05-23 ENCOUNTER — Ambulatory Visit: Payer: Medicare PPO | Admitting: "Endocrinology

## 2021-05-23 ENCOUNTER — Other Ambulatory Visit: Payer: Self-pay

## 2021-05-23 ENCOUNTER — Encounter: Payer: Self-pay | Admitting: "Endocrinology

## 2021-05-23 VITALS — BP 120/66 | HR 84 | Ht 61.0 in | Wt 192.8 lb

## 2021-05-23 DIAGNOSIS — E559 Vitamin D deficiency, unspecified: Secondary | ICD-10-CM

## 2021-05-23 DIAGNOSIS — I10 Essential (primary) hypertension: Secondary | ICD-10-CM | POA: Diagnosis not present

## 2021-05-23 DIAGNOSIS — E663 Overweight: Secondary | ICD-10-CM

## 2021-05-23 DIAGNOSIS — Z794 Long term (current) use of insulin: Secondary | ICD-10-CM

## 2021-05-23 DIAGNOSIS — N183 Chronic kidney disease, stage 3 unspecified: Secondary | ICD-10-CM | POA: Diagnosis not present

## 2021-05-23 DIAGNOSIS — E1165 Type 2 diabetes mellitus with hyperglycemia: Secondary | ICD-10-CM

## 2021-05-23 DIAGNOSIS — Z6835 Body mass index (BMI) 35.0-35.9, adult: Secondary | ICD-10-CM | POA: Diagnosis not present

## 2021-05-23 DIAGNOSIS — E1122 Type 2 diabetes mellitus with diabetic chronic kidney disease: Secondary | ICD-10-CM | POA: Diagnosis not present

## 2021-05-23 DIAGNOSIS — H02403 Unspecified ptosis of bilateral eyelids: Secondary | ICD-10-CM | POA: Diagnosis not present

## 2021-05-23 DIAGNOSIS — E678 Other specified hyperalimentation: Secondary | ICD-10-CM

## 2021-05-23 DIAGNOSIS — IMO0002 Reserved for concepts with insufficient information to code with codable children: Secondary | ICD-10-CM

## 2021-05-23 MED ORDER — NOVOLOG FLEXPEN 100 UNIT/ML ~~LOC~~ SOPN: 5.0000 [IU] | PEN_INJECTOR | Freq: Three times a day (TID) | SUBCUTANEOUS | 2 refills | Status: DC

## 2021-05-23 MED ORDER — TRESIBA FLEXTOUCH 200 UNIT/ML ~~LOC~~ SOPN: 60.0000 [IU] | PEN_INJECTOR | Freq: Every day | SUBCUTANEOUS | 2 refills | Status: DC

## 2021-05-23 NOTE — Progress Notes (Signed)
05/23/2021, 11:28 AM  Endocrinology follow-up note   Subjective:    Patient ID: Alisha [name], female    DOB: September 08, 1951.  Alisha [name] is being seen in follow-up after she was seen in consultation for management of currently uncontrolled symptomatic diabetes requested by  Glenda Chroman, MD.   Past Medical History:  Diagnosis Date   B12 deficiency 11/03/2016   B12 deficiency 11/03/2016   Barrett's esophagus    Cirrhosis of liver without mention of alcohol    hep B surface antigen and HCV ab negative.pt has not had hepatitis A and B vaccines.U/S on 07/04/13 shows cirrhosis.   Diverticula of colon    pancolonic   Esophageal reflux    Esophageal varices (HCC)    Hiatal hernia    Iron deficiency anemia, unspecified    Other and unspecified hyperlipidemia    Portal hypertensive gastropathy (HCC)    Tubular adenoma    Type II or unspecified type diabetes mellitus without mention of complication, not stated as uncontrolled    Unspecified essential hypertension    Unspecified hemorrhoids without mention of complication     Past Surgical History:  Procedure Laterality Date   ABDOMINAL HYSTERECTOMY     BIOPSY  04/30/2016   Procedure: BIOPSY;  Surgeon: Daneil Dolin, MD;  [facility];  Service: Endoscopy;;  esophagus   BIOPSY  02/24/2019   Procedure: BIOPSY;  Surgeon: Daneil Dolin, MD;  [facility];  Service: Endoscopy;;   CESAREAN SECTION     x 2   COLONOSCOPY  03/2006   left sided diverticula, splenic flexure tublar adenoma   COLONOSCOPY  07/2002   villous tubular adenoma in rectum   COLONOSCOPY  09/23/09   external hemorrhoids/scattered pan colonic diverticula otherwise normal. cecal lipoma bx negative, normal TI. Next TCS 09/2014   COLONOSCOPY N/A 11/12/2014   Procedure: COLONOSCOPY;  Surgeon: Daneil Dolin, MD;  [facility]: [facility];  Service: Endoscopy;   Laterality: N/A;  1215 - moved to 12:30 - Ginger to notify pt   COLONOSCOPY N/A 01/07/2017   Rourk: Grade 4 internal hemorrhoids, 5 mm ulcer in the sigmoid colon (benign bx), 7 mm polyp removed from the cecum (tubular adenoma), diverticulosis.next tcs in five years   ESOPHAGEAL BANDING N/A 12/20/2016   Procedure: ESOPHAGEAL BANDING;  Surgeon: Danie Binder, MD;  [facility];  Service: Endoscopy;  Laterality: N/A;   ESOPHAGEAL BANDING N/A 03/18/2017   Procedure: ESOPHAGEAL BANDING;  Surgeon: Daneil Dolin, MD;  [facility];  Service: Endoscopy;  Laterality: N/A;   ESOPHAGEAL BANDING  12/17/2017   Procedure: ESOPHAGEAL BANDING;  Surgeon: Daneil Dolin, MD;  [facility];  Service: Endoscopy;;   ESOPHAGEAL BANDING N/A 07/03/2020   Procedure: ESOPHAGEAL BANDING;  Surgeon: Daneil Dolin, MD;  [facility];  Service: Endoscopy;  Laterality: N/A;   ESOPHAGOGASTRODUODENOSCOPY  08/2006   barretts esophagus, no dyplasia   ESOPHAGOGASTRODUODENOSCOPY  03/2006   barretts esophagus,bx focal atypia c/w low grade dysplasia, SB bx negative for celiac   ESOPHAGOGASTRODUODENOSCOPY  09/23/09   3 columns of grade 2 esophageal varices/hiatal  hernia/3-4 cm segment Barrett's esophagus without dysplasia. Next EGD 09/2012   ESOPHAGOGASTRODUODENOSCOPY N/A 11/11/2012   Dr. Gala Romney- Barrett;s esophagus, esophageal varices, portal gastopathy. hiatal hernia   ESOPHAGOGASTRODUODENOSCOPY N/A 04/30/2016   Procedure: ESOPHAGOGASTRODUODENOSCOPY (EGD);  Surgeon: Daneil Dolin, MD;  [facility];  Service: Endoscopy;  Laterality: N/A;  815   ESOPHAGOGASTRODUODENOSCOPY N/A 12/20/2016   Procedure: ESOPHAGOGASTRODUODENOSCOPY (EGD);  Surgeon: Danie Binder, MD;  [facility];  Service: Endoscopy;  Laterality: N/A;   ESOPHAGOGASTRODUODENOSCOPY N/A 01/07/2017   Procedure: ESOPHAGOGASTRODUODENOSCOPY (EGD);  Surgeon: Daneil Dolin, MD;  [facility];  Service:  Endoscopy;  Laterality: N/A;   ESOPHAGOGASTRODUODENOSCOPY N/A 03/18/2017   Procedure: ESOPHAGOGASTRODUODENOSCOPY (EGD);  Surgeon: Daneil Dolin, MD;  [facility];  Service: Endoscopy;  Laterality: N/A;  1030    ESOPHAGOGASTRODUODENOSCOPY N/A 12/17/2017   Procedure: ESOPHAGOGASTRODUODENOSCOPY (EGD);  Surgeon: Daneil Dolin, MD;  [facility];  Service: Endoscopy;  Laterality: N/A;  10:30am   ESOPHAGOGASTRODUODENOSCOPY N/A 02/24/2019   Rourk: No esophageal varices seen, scars noted from prior banding.  5 cm segment of salmon-colored epithelium coming up from the GE junction consistent with Barrett's (bx with no dysplasia).  Portal gastropathy.  Small hiatal hernia.  Recommended repeat EGD in 18 months.   ESOPHAGOGASTRODUODENOSCOPY N/A 07/03/2020   Procedure: ESOPHAGOGASTRODUODENOSCOPY (EGD);  Surgeon: Daneil Dolin, MD;  [facility];  Service: Endoscopy;  Laterality: N/A;  10:15am   HEMORRHOID SURGERY  2008   small bowel capsule endoscopy  10/2009   normal    Social History   Socioeconomic History   Marital status: Married    Spouse name: Not on file   Number of children: Not on file   Years of education: Not on file   Highest education level: Not on file  Occupational History   Not on file  Tobacco Use   Smoking status: Never   Smokeless tobacco: Never  Vaping Use   Vaping Use: Never used  Substance and Sexual Activity   Alcohol use: No   Drug use: No   Sexual activity: Yes  Other Topics Concern   Not on file  Social History Narrative   Not on file   Social Determinants of Health   Financial Resource Strain: Not on file  Food Insecurity: Not on file  Transportation Needs: Not on file  Physical Activity: Not on file  Stress: Not on file  Social Connections: Not on file    Family History  Problem Relation Age of Onset   Alzheimer's disease Mother    CAD Father    Diabetes Mellitus II Father    Alzheimer's disease Father     Diabetes Mellitus II Sister    Colon cancer Sister 56       small, surgical excision; chemo/rad not needed   Cancer - Other Brother    Diabetes Mellitus II Brother    Hypertension Brother    Liver disease Neg Hx     Outpatient Encounter Medications as of 05/23/2021  Medication Sig   Accu-Chek Softclix Lancets lancets USE TO CHECK BLOOD SUGAR FOUR TIMES DAILY. Dx: E11.65   Cholecalciferol (VITAMIN D) 2000 units tablet Take 2,000 Units by mouth every other day.   Continuous Blood Gluc Receiver (FREESTYLE LIBRE 2 READER) DEVI As directed   Continuous Blood Gluc Sensor (FREESTYLE LIBRE 2 SENSOR) MISC 1 Piece by Does not apply route every 14 (fourteen) days.   insulin aspart (NOVOLOG FLEXPEN) 100 UNIT/ML FlexPen Inject  5-11 Units into the skin 3 (three) times daily before meals.   insulin degludec (TRESIBA FLEXTOUCH) 200 UNIT/ML FlexTouch Pen Inject 60 Units into the skin at bedtime.   lisinopril (PRINIVIL,ZESTRIL) 10 MG tablet Take 10 mg by mouth daily.   NOVOFINE 32G X 6 MM MISC USE ONE AS DIRECTED TWICE DAILY.   pantoprazole (PROTONIX) 40 MG tablet Take 1 tablet (40 mg total) by mouth 2 (two) times daily before a meal.   propranolol (INDERAL) 10 MG tablet TAKE THREE (3) TABLETS BY MOUTH EVERY MORNING AND TAKE THREE (3) TABLETS BY MOUTH EVERY EVENING   ULTICARE PEN NEEDLES 31G X 5 MM MISC USE ONE AS DIRECTED TWICE DAILY.   VICTOZA 18 MG/3ML SOLN Inject 1.8 mg into the skin daily.   [DISCONTINUED] cyanocobalamin 1000 MCG tablet Take 1,000 mcg by mouth every other day.   [DISCONTINUED] glipiZIDE (GLUCOTROL XL) 5 MG 24 hr tablet TAKE ONE TABLET BY MOUTH ONCE DAILY WITH BREAKFAST.   [DISCONTINUED] insulin aspart (NOVOLOG FLEXPEN) 100 UNIT/ML FlexPen Inject 8-14 Units into the skin 3 (three) times daily with meals.   [DISCONTINUED] TRESIBA FLEXTOUCH 200 UNIT/ML FlexTouch Pen INJECT 70 UNITS INTO THE SKIN AT BEDTIME   No facility-administered encounter medications on file as of 05/23/2021.     ALLERGIES: Allergies  Allergen Reactions   Codeine Nausea And Vomiting    VACCINATION STATUS:  There is no immunization history on file for this patient.  Diabetes She presents for her follow-up diabetic visit. She has type 2 diabetes mellitus. Onset time: She was diagnosed at approximate age of 72 years. Her disease course has been improving. There are no hypoglycemic associated symptoms. Pertinent negatives for hypoglycemia include no confusion, headaches, pallor or seizures. Pertinent negatives for diabetes include no chest pain, no fatigue, no polydipsia, no polyphagia and no polyuria. (She did not bring any logs nor meter to review.  Patient verbally reports that she is having fluctuating glycemic profile including hypoglycemia.) There are no hypoglycemic complications. Symptoms are improving. Diabetic complications include nephropathy. Risk factors for coronary artery disease include diabetes mellitus, hypertension, obesity, post-menopausal and family history. Her weight is decreasing steadily. She is following a generally unhealthy diet. When asked about meal planning, she reported none. She has had a previous visit with a dietitian. She participates in exercise intermittently. Her home blood glucose trend is decreasing steadily. Her breakfast blood glucose range is generally 130-140 mg/dl. Her dinner blood glucose range is generally 140-180 mg/dl. Her bedtime blood glucose range is generally 140-180 mg/dl. Her overall blood glucose range is 140-180 mg/dl. (She presents with improved glycemic profile averaging 148 for the last 14 days improving from 208 from the last 30 days.  Her recent A1c was 8.7%.   No hypoglycemia documented or reported.     ) An ACE inhibitor/angiotensin II receptor blocker is being taken. Eye exam is current.  Hypertension This is a chronic problem. The current episode started more than 1 year ago. The problem is controlled. Pertinent negatives include no chest  pain, headaches, palpitations or shortness of breath. Risk factors for coronary artery disease include diabetes mellitus. Past treatments include ACE inhibitors. Hypertensive end-organ damage includes kidney disease. Identifiable causes of hypertension include chronic renal disease.    Review of Systems  Constitutional:  Negative for chills, fatigue, fever and unexpected weight change.  HENT:  Negative for trouble swallowing and voice change.   Eyes:  Negative for visual disturbance.  Respiratory:  Negative for cough, shortness of breath and wheezing.  Cardiovascular:  Negative for chest pain, palpitations and leg swelling.  Gastrointestinal:  Negative for diarrhea, nausea and vomiting.  Endocrine: Negative for cold intolerance, heat intolerance, polydipsia, polyphagia and polyuria.  Musculoskeletal:  Negative for arthralgias and myalgias.  Skin:  Negative for color change, pallor, rash and wound.  Neurological:  Negative for seizures and headaches.  Psychiatric/Behavioral:  Negative for confusion and suicidal ideas.    Objective:    Vitals with BMI 05/23/2021 05/15/2021 04/29/2021  Height 5' 1"  - 5' 1"   Weight 192 lbs 13 oz 195 lbs 198 lbs 13 oz  BMI 36.45 31.51 76.16  Systolic 073 - 710  Diastolic 66 - 54  Pulse 84 - 72    BP 120/66   Pulse 84   Ht 5' 1"  (1.549 m)   Wt 192 lb 12.8 oz (87.5 kg)   BMI 36.43 kg/m   Wt Readings from Last 3 Encounters:  05/23/21 192 lb 12.8 oz (87.5 kg)  05/15/21 195 lb (88.5 kg)  04/29/21 198 lb 12.8 oz (90.2 kg)     Physical Exam Constitutional:      Appearance: She is well-developed.  HENT:     Head: Normocephalic and atraumatic.  Neck:     Thyroid: No thyromegaly.     Trachea: No tracheal deviation.  Cardiovascular:     Rate and Rhythm: Normal rate and regular rhythm.  Pulmonary:     Effort: Pulmonary effort is normal.  Abdominal:     Tenderness: There is no abdominal tenderness. There is no guarding.  Musculoskeletal:         General: Normal range of motion.     Cervical back: Normal range of motion and neck supple.     Comments: Her foot exam was normal today.  Skin:    General: Skin is warm and dry.     Coloration: Skin is not pale.     Findings: No erythema or rash.  Neurological:     Mental Status: She is alert and oriented to person, place, and time.     Cranial Nerves: No cranial nerve deficit.     Coordination: Coordination normal.     Deep Tendon Reflexes: Reflexes are normal and symmetric.  Psychiatric:        Judgment: Judgment normal.      CMP ( most recent) CMP     Component Value Date/Time   NA 137 05/07/2021 1333   K 4.3 05/07/2021 1333   CL 110 05/07/2021 1333   CO2 24 05/07/2021 1333   GLUCOSE 249 (H) 05/07/2021 1333   BUN 17 05/07/2021 1333   BUN 15 01/25/2020 0000   CREATININE 1.54 (H) 05/07/2021 1333   CREATININE 1.62 (H) 03/28/2018 1146   CALCIUM 8.5 (L) 05/07/2021 1333   CALCIUM 9.1 01/25/2020 1059   PROT 6.5 05/07/2021 1333   ALBUMIN 3.5 05/07/2021 1333   AST 31 05/07/2021 1333   ALT 19 05/07/2021 1333   ALKPHOS 100 05/07/2021 1333   BILITOT 3.4 (H) 05/07/2021 1333   GFRNONAA 36 (L) 05/07/2021 1333   GFRAA 36 (L) 05/29/2020 1416     Diabetic Labs (most recent): Lab Results  Component Value Date   HGBA1C 8.7 (A) 04/29/2021   HGBA1C 8.8 (A) 12/23/2020   HGBA1C 8.5 (A) 08/21/2020     Lipid Panel ( most recent) Lipid Panel     Component Value Date/Time   CHOL 157 12/20/2020 1511   TRIG 121 12/20/2020 1511   HDL 42 12/20/2020 1511   CHOLHDL 3.7  12/20/2020 1511   VLDL 24 12/20/2020 1511   LDLCALC 91 12/20/2020 1511      Lab Results  Component Value Date   TSH 2.531 12/20/2020   TSH 2.46 01/25/2020   FREET4 0.97 12/20/2020      Assessment & Plan:   1. Uncontrolled type 2 diabetes mellitus with stage 3 chronic kidney disease ([place])  - Alisha [name] has currently uncontrolled symptomatic type 2 DM since 70 years of age. She presents with  improved glycemic profile averaging 148 for the last 14 days improving from 208 from the last 30 days.  Her recent A1c was 8.7%.   No hypoglycemia documented or reported.     Recent labs reviewed. - I had a long discussion with her about the progressive nature of diabetes and the pathology behind its complications. -her diabetes is complicated by CKD and she remains at a high risk for more acute and chronic complications which include CAD, CVA, CKD, retinopathy, and neuropathy. These are all discussed in detail with her.  - I have counseled her on diet  and weight management  by adopting a carbohydrate restricted/protein rich diet. Patient is encouraged to switch to  unprocessed or minimally processed     complex starch and increased protein intake (animal or plant source), fruits, and vegetables. -  she is advised to stick to a routine mealtimes to eat 3 meals  a day and avoid unnecessary snacks ( to snack only to correct hypoglycemia).   - she acknowledges that there is a room for improvement in her food and drink choices. - Suggestion is made for her to avoid simple carbohydrates  from her diet including Cakes, Sweet Desserts, Ice Cream, Soda (diet and regular), Sweet Tea, Candies, Chips, Cookies, Store Bought Juices, Alcohol in Excess of  1-2 drinks a day, Artificial Sweeteners,  Coffee Creamer, and "Sugar-free" Products, Lemonade. This will help patient to have more stable blood glucose profile and potentially avoid unintended weight gain.   - she will be scheduled with Jearld Fenton, RDN, CDE for diabetes education.  - I have approached her with the following individualized plan to manage  her diabetes and patient agrees:   - she has required multiple daily injections of insulin to control diabetes in the past.   -Based on her presentation with near target glycemic profile, she will continue to need multiple daily injections of insulin in order for her to achieve control of diabetes to  target.    She has received her package for CGM device.  She will be helped with the first application today.  She is advised to lower her Tresiba to 60 units nightly, discussed and lowered NovoLog to 5  units 3 times daily AC for Premeal blood glucose readings above 90 mg per DL, associated with monitoring of blood glucose 4 times a day.   - she is warned not to take insulin without proper monitoring per orders. - she is encouraged to call clinic for blood glucose levels less than 70 or above 200 mg /dl. - she is advised to continue Victoza 1.8 mg of 3 CBC, advised to discontinue glipizide at this time.   - she is not a candidate for Metformin, SGLT2 inhibitors due to concurrent renal insufficiency.  - Specific targets for  A1c;  LDL, HDL,  and Triglycerides were discussed with the patient.  2) Blood Pressure /Hypertension:  Her blood pressure is controlled to target. she is advised to continue her current medications including lisinopril 10  mg p.o. daily with breakfast .   3) Lipids/Hyperlipidemia:   Review of her recent lipid panel showed  controlled uncontrolled LDL at 91.    she  is not on statins.  She will be considered for fasting lipid panel and low-dose statins on subsequent visits.    4)  Weight/Diet:  Body mass index is 36.43 kg/m.  -   clearly complicating her diabetes care.   she is  a candidate for weight loss. I discussed with her the fact that loss of 5 - 10% of her  current body weight will have the most impact on her diabetes management.  Exercise, and detailed carbohydrates information provided  -  detailed on discharge instructions.  5) Chronic Care/Health Maintenance:  -she  is on ACEI Statin medications and  is encouraged to initiate and continue to follow up with Ophthalmology, Dentist,  Podiatrist at least yearly or according to recommendations, and advised to   stay away from smoking. I have recommended yearly flu vaccine and pneumonia vaccine at least every 5 years;  moderate intensity exercise for up to 150 minutes weekly; and  sleep for at least 7 hours a day.  6.  Vitamin D deficiency: She is advised to continue cholecalciferol 2000 units daily.   She recently had normal ABI, will be repeated in 5 years.  - she is  advised to maintain close follow up with Glenda Chroman, MD for primary care needs, as well as her other providers for optimal and coordinated care.     I spent 44 minutes in the care of the patient today including review of labs from [facility], Lipids, Thyroid Function, Hematology (current and previous including abstractions from other facilities); face-to-face time discussing  her blood glucose readings/logs, discussing hypoglycemia and hyperglycemia episodes and symptoms, medications doses, her options of short and long term treatment based on the latest standards of care / guidelines;  discussion about incorporating lifestyle medicine;  and documenting the encounter.    Please refer to Patient Instructions for Blood Glucose Monitoring and Insulin/Medications Dosing Guide"  in media tab for additional information. Please  also refer to " Patient Self Inventory" in the Media  tab for reviewed elements of pertinent patient history.  Alisha [name] participated in the discussions, expressed understanding, and voiced agreement with the above plans.  All questions were answered to her satisfaction. she is encouraged to contact clinic should she have any questions or concerns prior to her return visit.      Follow up plan: - Return in about 3 months (around 08/22/2021) for Bring Meter and Logs- A1c in Office.  Glade Lloyd, MD [facility] [address] [place], [place] [address] Phone: [phone]  Fax: [phone]    05/23/2021, 11:28 AM  This note was partially dictated with voice recognition software. Similar sounding words can be transcribed inadequately or may not  be corrected upon  review.

## 2021-05-23 NOTE — Patient Instructions (Signed)

## 2021-05-31 ENCOUNTER — Other Ambulatory Visit: Payer: Self-pay | Admitting: "Endocrinology

## 2021-06-03 ENCOUNTER — Other Ambulatory Visit: Payer: Self-pay

## 2021-06-03 ENCOUNTER — Encounter: Payer: Self-pay | Admitting: Internal Medicine

## 2021-06-03 ENCOUNTER — Ambulatory Visit: Payer: Medicare PPO | Admitting: Internal Medicine

## 2021-06-03 VITALS — BP 123/72 | HR 84 | Temp 97.3°F | Ht 61.0 in | Wt 192.0 lb

## 2021-06-03 DIAGNOSIS — I8510 Secondary esophageal varices without bleeding: Secondary | ICD-10-CM | POA: Diagnosis not present

## 2021-06-03 DIAGNOSIS — K2270 Barrett's esophagus without dysplasia: Secondary | ICD-10-CM | POA: Diagnosis not present

## 2021-06-03 DIAGNOSIS — K7581 Nonalcoholic steatohepatitis (NASH): Secondary | ICD-10-CM | POA: Diagnosis not present

## 2021-06-03 DIAGNOSIS — K219 Gastro-esophageal reflux disease without esophagitis: Secondary | ICD-10-CM | POA: Diagnosis not present

## 2021-06-03 NOTE — Progress Notes (Signed)
Primary Care Physician:  Glenda Chroman, MD Primary Gastroenterologist:  Dr. Gala Romney  Pre-Procedure History & Physical: HPI:  Alisha [name] is a 70 y.o. female here for follow-up of Nash/cirrhosis.  Followed closely by [facility].  Enlarging lesion seen in segment 7 of the liver consistent with hepatocellular carcinoma by imaging.  Elevated AFP.  As understanding,, chemoembolization is being contemplated.  Followed by the transplant folks.  Recent MELD 19  History of upper GI bleed secondary to varices  -  status post banding locally.  She is due for surveillance EGD April 2023. History of Barrett's esophagus without dysplasia.  History of iron deficiency anemia for which she has received Feraheme.  Recent lung biopsy negative for carcinoma.  Appears to have some degree of portal vein thrombosis on most recent imaging.    History of colonic adenoma; due for surveillance colonoscopy 2023.  She continues on Inderal 30 mg twice daily.  Patient has been maintained on Protonix 40 mg twice daily for GERD has been on twice daily therapy for some time.   Past Medical History:  Diagnosis Date   B12 deficiency 11/03/2016   B12 deficiency 11/03/2016   Barrett's esophagus    Cirrhosis of liver without mention of alcohol    hep B surface antigen and HCV ab negative.pt has not had hepatitis A and B vaccines.U/S on 07/04/13 shows cirrhosis.   Diverticula of colon    pancolonic   Esophageal reflux    Esophageal varices (HCC)    Hiatal hernia    Iron deficiency anemia, unspecified    Other and unspecified hyperlipidemia    Portal hypertensive gastropathy (HCC)    Tubular adenoma    Type II or unspecified type diabetes mellitus without mention of complication, not stated as uncontrolled    Unspecified essential hypertension    Unspecified hemorrhoids without mention of complication     Past Surgical History:  Procedure Laterality Date   ABDOMINAL HYSTERECTOMY     BIOPSY  04/30/2016    Procedure: BIOPSY;  Surgeon: Daneil Dolin, MD;  [facility];  Service: Endoscopy;;  esophagus   BIOPSY  02/24/2019   Procedure: BIOPSY;  Surgeon: Daneil Dolin, MD;  [facility];  Service: Endoscopy;;   CESAREAN SECTION     x 2   COLONOSCOPY  03/2006   left sided diverticula, splenic flexure tublar adenoma   COLONOSCOPY  07/2002   villous tubular adenoma in rectum   COLONOSCOPY  09/23/09   external hemorrhoids/scattered pan colonic diverticula otherwise normal. cecal lipoma bx negative, normal TI. Next TCS 09/2014   COLONOSCOPY N/A 11/12/2014   Procedure: COLONOSCOPY;  Surgeon: Daneil Dolin, MD;  [facility]: [facility];  Service: Endoscopy;  Laterality: N/A;  1215 - moved to 12:30 - Ginger to notify pt   COLONOSCOPY N/A 01/07/2017   Giorgia Wahler: Grade 4 internal hemorrhoids, 5 mm ulcer in the sigmoid colon (benign bx), 7 mm polyp removed from the cecum (tubular adenoma), diverticulosis.next tcs in five years   ESOPHAGEAL BANDING N/A 12/20/2016   Procedure: ESOPHAGEAL BANDING;  Surgeon: Danie Binder, MD;  [facility];  Service: Endoscopy;  Laterality: N/A;   ESOPHAGEAL BANDING N/A 03/18/2017   Procedure: ESOPHAGEAL BANDING;  Surgeon: Daneil Dolin, MD;  [facility];  Service: Endoscopy;  Laterality: N/A;   ESOPHAGEAL BANDING  12/17/2017   Procedure: ESOPHAGEAL BANDING;  Surgeon: Daneil Dolin, MD;  [facility];  Service: Endoscopy;;  ESOPHAGEAL BANDING N/A 07/03/2020   Procedure: ESOPHAGEAL BANDING;  Surgeon: Daneil Dolin, MD;  [facility];  Service: Endoscopy;  Laterality: N/A;   ESOPHAGOGASTRODUODENOSCOPY  08/2006   barretts esophagus, no dyplasia   ESOPHAGOGASTRODUODENOSCOPY  03/2006   barretts esophagus,bx focal atypia c/w low grade dysplasia, SB bx negative for celiac   ESOPHAGOGASTRODUODENOSCOPY  09/23/09   3 columns of grade 2 esophageal varices/hiatal hernia/3-4 cm segment Barrett's esophagus without dysplasia.  Next EGD 09/2012   ESOPHAGOGASTRODUODENOSCOPY N/A 11/11/2012   Dr. Gala Romney- Barrett;s esophagus, esophageal varices, portal gastopathy. hiatal hernia   ESOPHAGOGASTRODUODENOSCOPY N/A 04/30/2016   Procedure: ESOPHAGOGASTRODUODENOSCOPY (EGD);  Surgeon: Daneil Dolin, MD;  [facility];  Service: Endoscopy;  Laterality: N/A;  815   ESOPHAGOGASTRODUODENOSCOPY N/A 12/20/2016   Procedure: ESOPHAGOGASTRODUODENOSCOPY (EGD);  Surgeon: Danie Binder, MD;  [facility];  Service: Endoscopy;  Laterality: N/A;   ESOPHAGOGASTRODUODENOSCOPY N/A 01/07/2017   Procedure: ESOPHAGOGASTRODUODENOSCOPY (EGD);  Surgeon: Daneil Dolin, MD;  [facility];  Service: Endoscopy;  Laterality: N/A;   ESOPHAGOGASTRODUODENOSCOPY N/A 03/18/2017   Procedure: ESOPHAGOGASTRODUODENOSCOPY (EGD);  Surgeon: Daneil Dolin, MD;  [facility];  Service: Endoscopy;  Laterality: N/A;  1030    ESOPHAGOGASTRODUODENOSCOPY N/A 12/17/2017   Procedure: ESOPHAGOGASTRODUODENOSCOPY (EGD);  Surgeon: Daneil Dolin, MD;  [facility];  Service: Endoscopy;  Laterality: N/A;  10:30am   ESOPHAGOGASTRODUODENOSCOPY N/A 02/24/2019   Stewart Pimenta: No esophageal varices seen, scars noted from prior banding.  5 cm segment of salmon-colored epithelium coming up from the GE junction consistent with Barrett's (bx with no dysplasia).  Portal gastropathy.  Small hiatal hernia.  Recommended repeat EGD in 18 months.   ESOPHAGOGASTRODUODENOSCOPY N/A 07/03/2020   Procedure: ESOPHAGOGASTRODUODENOSCOPY (EGD);  Surgeon: Daneil Dolin, MD;  [facility];  Service: Endoscopy;  Laterality: N/A;  10:15am   HEMORRHOID SURGERY  2008   small bowel capsule endoscopy  10/2009   normal    Prior to Admission medications   Medication Sig Start Date End Date Taking? Authorizing Provider  Accu-Chek Softclix Lancets lancets USE TO CHECK BLOOD SUGAR FOUR TIMES DAILY. Dx: E11.65 05/07/21  Yes [name], MD   Cholecalciferol (VITAMIN D) 2000 units tablet Take 2,000 Units by mouth every other day.   Yes [provider]  Continuous Blood Gluc Receiver (FREESTYLE LIBRE 2 READER) DEVI As directed 04/29/21  Yes Nida, Marella Chimes, MD  Continuous Blood Gluc Sensor (FREESTYLE LIBRE 2 SENSOR) MISC 1 Piece by Does not apply route every 14 (fourteen) days. 04/29/21  Yes Nida, Marella Chimes, MD  doxycycline (VIBRAMYCIN) 100 MG capsule Take 100 mg by mouth daily. 05/23/21  Yes [provider]  insulin aspart (NOVOLOG FLEXPEN) 100 UNIT/ML FlexPen Inject 5-11 Units into the skin 3 (three) times daily before meals. 05/23/21  Yes Nida, Marella Chimes, MD  lisinopril (PRINIVIL,ZESTRIL) 10 MG tablet Take 10 mg by mouth daily. 04/09/12  Yes [provider]  NOVOFINE 32G X 6 MM MISC USE ONE AS DIRECTED TWICE DAILY. 09/15/18  Yes [provider]  pantoprazole (PROTONIX) 40 MG tablet Take 1 tablet (40 mg total) by mouth 2 (two) times daily before a meal. 12/24/16  Yes Orvan Falconer, MD  propranolol (INDERAL) 10 MG tablet TAKE THREE (3) TABLETS BY MOUTH EVERY MORNING AND TAKE THREE (3) TABLETS BY MOUTH EVERY EVENING 02/14/21  Yes Mahala Menghini, PA-C  TRESIBA FLEXTOUCH 200 UNIT/ML FlexTouch Pen INJECT 70 UNITS INTO THE SKIN AT  BEDTIME Patient taking differently: Inject 60 Units into the skin at bedtime. 06/02/21  Yes Nida, Marella Chimes, MD  ULTICARE PEN NEEDLES 31G X 5 MM MISC USE ONE AS DIRECTED TWICE DAILY. 08/15/19  Yes [provider]  VICTOZA 18 MG/3ML SOLN Inject 1.8 mg into the skin daily. 04/15/12  Yes [provider]    Allergies as of 06/03/2021 - Review Complete 06/03/2021  Allergen Reaction Noted   Codeine Nausea And Vomiting 04/19/2012    Family History  Problem Relation Age of Onset   Alzheimer's disease Mother    CAD Father    Diabetes Mellitus II Father    Alzheimer's disease Father    Diabetes Mellitus II Sister    Colon cancer Sister 30       small,  surgical excision; chemo/rad not needed   Cancer - Other Brother    Diabetes Mellitus II Brother    Hypertension Brother    Liver disease Neg Hx     Social History   Socioeconomic History   Marital status: Married    Spouse name: Not on file   Number of children: Not on file   Years of education: Not on file   Highest education level: Not on file  Occupational History   Not on file  Tobacco Use   Smoking status: Never   Smokeless tobacco: Never  Vaping Use   Vaping Use: Never used  Substance and Sexual Activity   Alcohol use: No   Drug use: No   Sexual activity: Yes  Other Topics Concern   Not on file  Social History Narrative   Not on file   Social Determinants of Health   Financial Resource Strain: Not on file  Food Insecurity: Not on file  Transportation Needs: Not on file  Physical Activity: Not on file  Stress: Not on file  Social Connections: Not on file  Intimate Partner Violence: Not on file    Review of Systems: See HPI, otherwise negative ROS  Physical Exam: BP 123/72   Pulse 84   Temp (!) 97.3 F (36.3 C) (Temporal)   Ht 5' 1"  (1.549 m)   Wt 192 lb (87.1 kg)   BMI 36.28 kg/m  General:   Alert,  pleasant and cooperative in NAD SNeck:  Supple; no masses or thyromegaly. No significant cervical adenopathy. Lungs:  Clear throughout to auscultation.   No wheezes, crackles, or rhonchi. No acute distress. Heart:  Regular rate and rhythm; no murmurs, clicks, rubs,  or gallops. Abdomen: Obese.  Positive bowel sounds soft and nontender liver edge indistinct right costal margin by percussion.  I do not blot a spleen.   Pulses:  Normal pulses noted. Extremities:  Without clubbing or edema.  Impression/Plan: 70 year old lady with Nash cirrhosis.  Known portal hypertension.  History of variceal hemorrhage status post EBL.  Clinically remains compensated at this time.  Enlarging liver lesion consistent with hepatocellular carcinoma by imaging.  MELD 19. May  not be adequately beta blocked with her current dose of Inderal  History of Barrett's esophagus and colonic adenoma.  Due for surveillance EGD (varices) April 2023 and surveillance colonoscopy 2023.   Recommendations:  Continue with your close follow-up at [facility]  I recommend you decrease Protonix to 40 mg once daily.  This should suffice.  If you have reflux symptoms on once a day, you may go back to twice a day  We will tentatively plan to perform a repeat EGD in April of next year.  Office  visit with [name] in March 2023 and as needed   Notice: This dictation was prepared with Dragon dictation along with smaller phrase technology. Any transcriptional errors that result from this process are unintentional and may not be corrected upon review.

## 2021-06-03 NOTE — Patient Instructions (Signed)
It was good to see you again today!  Continue with your close follow-up at [facility]  I recommend you decrease Protonix to 40 mg once daily.  This should suffice.  If you have reflux symptoms on once a day, you may go back to twice a day  We will tentatively plan to perform a repeat EGD in April of next year.  Office visit with [name] in March 2023 and as needed

## 2021-06-08 DIAGNOSIS — E1165 Type 2 diabetes mellitus with hyperglycemia: Secondary | ICD-10-CM | POA: Diagnosis not present

## 2021-06-20 ENCOUNTER — Other Ambulatory Visit (HOSPITAL_COMMUNITY)
Admission: RE | Admit: 2021-06-20 | Discharge: 2021-06-20 | Disposition: A | Discharge status: Home / Self Care | Payer: Medicare PPO | Source: Ambulatory Visit | Attending: Nephrology | Admitting: Nephrology

## 2021-06-20 DIAGNOSIS — E211 Secondary hyperparathyroidism, not elsewhere classified: Secondary | ICD-10-CM | POA: Diagnosis present

## 2021-06-20 DIAGNOSIS — D500 Iron deficiency anemia secondary to blood loss (chronic): Secondary | ICD-10-CM | POA: Insufficient documentation

## 2021-06-20 DIAGNOSIS — D696 Thrombocytopenia, unspecified: Secondary | ICD-10-CM

## 2021-06-20 DIAGNOSIS — E538 Deficiency of other specified B group vitamins: Secondary | ICD-10-CM

## 2021-06-20 DIAGNOSIS — E1122 Type 2 diabetes mellitus with diabetic chronic kidney disease: Secondary | ICD-10-CM | POA: Diagnosis present

## 2021-06-20 DIAGNOSIS — I10 Essential (primary) hypertension: Secondary | ICD-10-CM | POA: Diagnosis present

## 2021-06-20 DIAGNOSIS — N189 Chronic kidney disease, unspecified: Secondary | ICD-10-CM | POA: Diagnosis present

## 2021-06-20 LAB — COMPREHENSIVE METABOLIC PANEL
ALT: 19 U/L (ref 0–44)
AST: 35 U/L (ref 15–41)
Albumin: 3.5 g/dL (ref 3.5–5.0)
Alkaline Phosphatase: 102 U/L (ref 38–126)
Anion gap: 4 — ABNORMAL LOW (ref 5–15)
BUN: 14 mg/dL (ref 8–23)
CO2: 23 mmol/L (ref 22–32)
Calcium: 8.9 mg/dL (ref 8.9–10.3)
Chloride: 110 mmol/L (ref 98–111)
Creatinine, Ser: 1.47 mg/dL — ABNORMAL HIGH (ref 0.44–1.00)
GFR, Estimated: 38 mL/min — ABNORMAL LOW (ref >60)
Glucose, Bld: 275 mg/dL — ABNORMAL HIGH (ref 70–99)
Potassium: 4.3 mmol/L (ref 3.5–5.1)
Sodium: 137 mmol/L (ref 135–145)
Total Bilirubin: 2.5 mg/dL — ABNORMAL HIGH (ref 0.3–1.2)
Total Protein: 6.5 g/dL (ref 6.5–8.1)

## 2021-06-20 LAB — CBC WITH DIFFERENTIAL/PLATELET
Abs Immature Granulocytes: 0.01 10*3/uL (ref 0.00–0.07)
Basophils Absolute: 0 10*3/uL (ref 0.0–0.1)
Basophils Relative: 1 %
Eosinophils Absolute: 0.1 10*3/uL (ref 0.0–0.5)
Eosinophils Relative: 2 %
HCT: 34.9 % — ABNORMAL LOW (ref 36.0–46.0)
Hemoglobin: 11.9 g/dL — ABNORMAL LOW (ref 12.0–15.0)
Immature Granulocytes: 0 %
Lymphocytes Relative: 24 %
Lymphs Abs: 0.9 10*3/uL (ref 0.7–4.0)
MCH: 31 pg (ref 26.0–34.0)
MCHC: 34.1 g/dL (ref 30.0–36.0)
MCV: 90.9 fL (ref 80.0–100.0)
Monocytes Absolute: 0.3 10*3/uL (ref 0.1–1.0)
Monocytes Relative: 8 %
Neutro Abs: 2.5 10*3/uL (ref 1.7–7.7)
Neutrophils Relative %: 65 %
Platelets: 46 10*3/uL — ABNORMAL LOW (ref 150–400)
RBC: 3.84 MIL/uL — ABNORMAL LOW (ref 3.87–5.11)
RDW: 14.8 % (ref 11.5–15.5)
WBC: 3.8 10*3/uL — ABNORMAL LOW (ref 4.0–10.5)
nRBC: 0 % (ref 0.0–0.2)

## 2021-06-20 LAB — IRON AND TIBC
Iron: 92 ug/dL (ref 28–170)
Iron: 93 ug/dL (ref 28–170)
Saturation Ratios: 37 % — ABNORMAL HIGH (ref 10.4–31.8)
Saturation Ratios: 38 % — ABNORMAL HIGH (ref 10.4–31.8)
TIBC: 245 ug/dL — ABNORMAL LOW (ref 250–450)
TIBC: 249 ug/dL — ABNORMAL LOW (ref 250–450)
UIBC: 152 ug/dL
UIBC: 157 ug/dL

## 2021-06-20 LAB — VITAMIN B12
Vitamin B-12: 1052 pg/mL — ABNORMAL HIGH (ref 180–914)
Vitamin B-12: 958 pg/mL — ABNORMAL HIGH (ref 180–914)

## 2021-06-20 LAB — FERRITIN
Ferritin: 208 ng/mL (ref 11–307)
Ferritin: 235 ng/mL (ref 11–307)

## 2021-06-22 LAB — MICROALBUMIN / CREATININE URINE RATIO
Creatinine, Urine: 240.8 mg/dL
Microalb Creat Ratio: 4 mg/g creat (ref 0–29)
Microalb, Ur: 9.6 ug/mL — ABNORMAL HIGH

## 2021-06-23 LAB — METHYLMALONIC ACID, SERUM: Methylmalonic Acid, Quantitative: 169 nmol/L (ref 0–378)

## 2021-06-30 ENCOUNTER — Telehealth: Payer: Self-pay | Admitting: "Endocrinology

## 2021-06-30 NOTE — Telephone Encounter (Signed)
Caryl Pina called, lvm from [facility] and is requesting a nurse to call back in regards to one of patients prescriptions. [phone]

## 2021-06-30 NOTE — Telephone Encounter (Signed)
Alisha [name] and she stated that Dr.Vyas is prescribing Victoza and sending in [name] needles for this patient. She wanted to make [name] aware. I thanked Caryl Pina for letting us know. This is accurate in last OV from 05/23/2021.

## 2021-07-01 DIAGNOSIS — C220 Liver cell carcinoma: Secondary | ICD-10-CM | POA: Diagnosis not present

## 2021-07-01 DIAGNOSIS — K7581 Nonalcoholic steatohepatitis (NASH): Secondary | ICD-10-CM | POA: Diagnosis not present

## 2021-07-01 DIAGNOSIS — K7469 Other cirrhosis of liver: Secondary | ICD-10-CM | POA: Diagnosis not present

## 2021-07-04 DIAGNOSIS — Z1231 Encounter for screening mammogram for malignant neoplasm of breast: Secondary | ICD-10-CM | POA: Diagnosis not present

## 2021-07-08 DIAGNOSIS — E1165 Type 2 diabetes mellitus with hyperglycemia: Secondary | ICD-10-CM | POA: Diagnosis not present

## 2021-07-17 DIAGNOSIS — C220 Liver cell carcinoma: Secondary | ICD-10-CM | POA: Diagnosis not present

## 2021-07-17 DIAGNOSIS — E1122 Type 2 diabetes mellitus with diabetic chronic kidney disease: Secondary | ICD-10-CM | POA: Diagnosis not present

## 2021-07-17 DIAGNOSIS — N189 Chronic kidney disease, unspecified: Secondary | ICD-10-CM | POA: Diagnosis not present

## 2021-07-17 DIAGNOSIS — D631 Anemia in chronic kidney disease: Secondary | ICD-10-CM | POA: Diagnosis not present

## 2021-07-17 DIAGNOSIS — D696 Thrombocytopenia, unspecified: Secondary | ICD-10-CM | POA: Diagnosis not present

## 2021-07-17 DIAGNOSIS — I10 Essential (primary) hypertension: Secondary | ICD-10-CM | POA: Diagnosis not present

## 2021-07-22 ENCOUNTER — Telehealth: Payer: Self-pay | Admitting: Internal Medicine

## 2021-07-22 DIAGNOSIS — C220 Liver cell carcinoma: Secondary | ICD-10-CM | POA: Diagnosis not present

## 2021-07-22 NOTE — Telephone Encounter (Signed)
Recall for mri/mrcp

## 2021-07-22 NOTE — Telephone Encounter (Signed)
Noted. Will disregard recall here for MRI. Per last [facility] note 06/2021 she will f/u in 3 months and have MRI abd also.

## 2021-07-22 NOTE — Telephone Encounter (Signed)
Per OV note 05/2021: Followed closely by [facility].  Enlarging lesion seen in segment 7 of the liver consistent with hepatocellular carcinoma by imaging.  Elevated AFP.  As understanding,, chemoembolization is being contemplated.  Followed by the transplant folks.   Dr. Gala Romney, should we disregard MRI/MRCP recall?

## 2021-08-08 DIAGNOSIS — E1165 Type 2 diabetes mellitus with hyperglycemia: Secondary | ICD-10-CM | POA: Diagnosis not present

## 2021-08-11 DIAGNOSIS — C220 Liver cell carcinoma: Secondary | ICD-10-CM | POA: Diagnosis not present

## 2021-08-12 ENCOUNTER — Other Ambulatory Visit: Payer: Self-pay | Admitting: Gastroenterology

## 2021-08-12 ENCOUNTER — Other Ambulatory Visit: Payer: Self-pay | Admitting: "Endocrinology

## 2021-08-12 DIAGNOSIS — C220 Liver cell carcinoma: Secondary | ICD-10-CM | POA: Diagnosis not present

## 2021-08-22 ENCOUNTER — Other Ambulatory Visit: Payer: Self-pay | Admitting: "Endocrinology

## 2021-08-25 ENCOUNTER — Other Ambulatory Visit: Payer: Self-pay

## 2021-08-25 ENCOUNTER — Encounter: Payer: Self-pay | Admitting: "Endocrinology

## 2021-08-25 ENCOUNTER — Ambulatory Visit (INDEPENDENT_AMBULATORY_CARE_PROVIDER_SITE_OTHER): Payer: Medicare PPO | Admitting: "Endocrinology

## 2021-08-25 VITALS — BP 142/78 | HR 68 | Ht 61.0 in | Wt 196.8 lb

## 2021-08-25 DIAGNOSIS — E1169 Type 2 diabetes mellitus with other specified complication: Secondary | ICD-10-CM | POA: Diagnosis not present

## 2021-08-25 DIAGNOSIS — E559 Vitamin D deficiency, unspecified: Secondary | ICD-10-CM

## 2021-08-25 DIAGNOSIS — Z794 Long term (current) use of insulin: Secondary | ICD-10-CM | POA: Diagnosis not present

## 2021-08-25 DIAGNOSIS — I10 Essential (primary) hypertension: Secondary | ICD-10-CM | POA: Diagnosis not present

## 2021-08-25 LAB — POCT GLYCOSYLATED HEMOGLOBIN (HGB A1C): HbA1c, POC (controlled diabetic range): 7.6 % — AB (ref 0.0–7.0)

## 2021-08-25 NOTE — Patient Instructions (Signed)

## 2021-08-25 NOTE — Telephone Encounter (Signed)
Patient has an appointment with you today at 11am. Sending to you so you can adjust the dosage if needed.

## 2021-08-25 NOTE — Progress Notes (Signed)
08/25/2021, 5:19 PM  Endocrinology follow-up note   Subjective:    Patient ID: Alisha [name], female    DOB: 1951/05/02.  Alisha [name] is being seen in follow-up after she was seen in consultation for management of currently uncontrolled symptomatic diabetes requested by  Glenda Chroman, MD.   Past Medical History:  Diagnosis Date   B12 deficiency 11/03/2016   B12 deficiency 11/03/2016   Barrett's esophagus    Cirrhosis of liver without mention of alcohol    hep B surface antigen and HCV ab negative.pt has not had hepatitis A and B vaccines.U/S on 07/04/13 shows cirrhosis.   Diverticula of colon    pancolonic   Esophageal reflux    Esophageal varices (HCC)    Hiatal hernia    Iron deficiency anemia, unspecified    Other and unspecified hyperlipidemia    Portal hypertensive gastropathy (HCC)    Tubular adenoma    Type II or unspecified type diabetes mellitus without mention of complication, not stated as uncontrolled    Unspecified essential hypertension    Unspecified hemorrhoids without mention of complication     Past Surgical History:  Procedure Laterality Date   ABDOMINAL HYSTERECTOMY     BIOPSY  04/30/2016   Procedure: BIOPSY;  Surgeon: Daneil Dolin, MD;  [facility];  Service: Endoscopy;;  esophagus   BIOPSY  02/24/2019   Procedure: BIOPSY;  Surgeon: Daneil Dolin, MD;  [facility];  Service: Endoscopy;;   CESAREAN SECTION     x 2   COLONOSCOPY  03/2006   left sided diverticula, splenic flexure tublar adenoma   COLONOSCOPY  07/2002   villous tubular adenoma in rectum   COLONOSCOPY  09/23/09   external hemorrhoids/scattered pan colonic diverticula otherwise normal. cecal lipoma bx negative, normal TI. Next TCS 09/2014   COLONOSCOPY N/A 11/12/2014   Procedure: COLONOSCOPY;  Surgeon: Daneil Dolin, MD;  [facility];  Service: Endoscopy;   Laterality: N/A;  1215 - moved to 12:30 - Ginger to notify pt   COLONOSCOPY N/A 01/07/2017   Rourk: Grade 4 internal hemorrhoids, 5 mm ulcer in the sigmoid colon (benign bx), 7 mm polyp removed from the cecum (tubular adenoma), diverticulosis.next tcs in five years   ESOPHAGEAL BANDING N/A 12/20/2016   Procedure: ESOPHAGEAL BANDING;  Surgeon: Danie Binder, MD;  [facility];  Service: Endoscopy;  Laterality: N/A;   ESOPHAGEAL BANDING N/A 03/18/2017   Procedure: ESOPHAGEAL BANDING;  Surgeon: Daneil Dolin, MD;  [facility];  Service: Endoscopy;  Laterality: N/A;   ESOPHAGEAL BANDING  12/17/2017   Procedure: ESOPHAGEAL BANDING;  Surgeon: Daneil Dolin, MD;  [facility];  Service: Endoscopy;;   ESOPHAGEAL BANDING N/A 07/03/2020   Procedure: ESOPHAGEAL BANDING;  Surgeon: Daneil Dolin, MD;  [facility];  Service: Endoscopy;  Laterality: N/A;   ESOPHAGOGASTRODUODENOSCOPY  08/2006   barretts esophagus, no dyplasia   ESOPHAGOGASTRODUODENOSCOPY  03/2006   barretts esophagus,bx focal atypia c/w low grade dysplasia, SB bx negative for celiac   ESOPHAGOGASTRODUODENOSCOPY  09/23/09   3 columns of grade 2 esophageal varices/hiatal  hernia/3-4 cm segment Barrett's esophagus without dysplasia. Next EGD 09/2012   ESOPHAGOGASTRODUODENOSCOPY N/A 11/11/2012   Dr. Gala Romney- Barrett;s esophagus, esophageal varices, portal gastopathy. hiatal hernia   ESOPHAGOGASTRODUODENOSCOPY N/A 04/30/2016   Procedure: ESOPHAGOGASTRODUODENOSCOPY (EGD);  Surgeon: Daneil Dolin, MD;  [facility];  Service: Endoscopy;  Laterality: N/A;  815   ESOPHAGOGASTRODUODENOSCOPY N/A 12/20/2016   Procedure: ESOPHAGOGASTRODUODENOSCOPY (EGD);  Surgeon: Danie Binder, MD;  [facility];  Service: Endoscopy;  Laterality: N/A;   ESOPHAGOGASTRODUODENOSCOPY N/A 01/07/2017   Procedure: ESOPHAGOGASTRODUODENOSCOPY (EGD);  Surgeon: Daneil Dolin, MD;  [facility];  Service:  Endoscopy;  Laterality: N/A;   ESOPHAGOGASTRODUODENOSCOPY N/A 03/18/2017   Procedure: ESOPHAGOGASTRODUODENOSCOPY (EGD);  Surgeon: Daneil Dolin, MD;  [facility];  Service: Endoscopy;  Laterality: N/A;  1030    ESOPHAGOGASTRODUODENOSCOPY N/A 12/17/2017   Procedure: ESOPHAGOGASTRODUODENOSCOPY (EGD);  Surgeon: Daneil Dolin, MD;  [facility];  Service: Endoscopy;  Laterality: N/A;  10:30am   ESOPHAGOGASTRODUODENOSCOPY N/A 02/24/2019   Rourk: No esophageal varices seen, scars noted from prior banding.  5 cm segment of salmon-colored epithelium coming up from the GE junction consistent with Barrett's (bx with no dysplasia).  Portal gastropathy.  Small hiatal hernia.  Recommended repeat EGD in 18 months.   ESOPHAGOGASTRODUODENOSCOPY N/A 07/03/2020   Procedure: ESOPHAGOGASTRODUODENOSCOPY (EGD);  Surgeon: Daneil Dolin, MD;  [facility];  Service: Endoscopy;  Laterality: N/A;  10:15am   HEMORRHOID SURGERY  2008   small bowel capsule endoscopy  10/2009   normal    Social History   Socioeconomic History   Marital status: Married    Spouse name: Not on file   Number of children: Not on file   Years of education: Not on file   Highest education level: Not on file  Occupational History   Not on file  Tobacco Use   Smoking status: Never   Smokeless tobacco: Never  Vaping Use   Vaping Use: Never used  Substance and Sexual Activity   Alcohol use: No   Drug use: No   Sexual activity: Yes  Other Topics Concern   Not on file  Social History Narrative   Not on file   Social Determinants of Health   Financial Resource Strain: Not on file  Food Insecurity: Not on file  Transportation Needs: Not on file  Physical Activity: Not on file  Stress: Not on file  Social Connections: Not on file    Family History  Problem Relation Age of Onset   Alzheimer's disease Mother    CAD Father    Diabetes Mellitus II Father    Alzheimer's disease Father     Diabetes Mellitus II Sister    Colon cancer Sister 26       small, surgical excision; chemo/rad not needed   Cancer - Other Brother    Diabetes Mellitus II Brother    Hypertension Brother    Liver disease Neg Hx     Outpatient Encounter Medications as of 08/25/2021  Medication Sig   Accu-Chek Softclix Lancets lancets USE TO CHECK BLOOD SUGAR FOUR TIMES DAILY. Dx: E11.65   Cholecalciferol (VITAMIN D) 2000 units tablet Take 2,000 Units by mouth every other day.   Continuous Blood Gluc Receiver (FREESTYLE LIBRE 2 READER) DEVI As directed   Continuous Blood Gluc Sensor (FREESTYLE LIBRE 2 SENSOR) MISC 1 Piece by Does not apply route every 14 (fourteen) days.   doxycycline (VIBRAMYCIN) 100 MG capsule Take 100 mg  by mouth daily. (Patient not taking: Reported on 08/25/2021)   insulin aspart (NOVOLOG FLEXPEN) 100 UNIT/ML FlexPen Inject 5-11 Units into the skin 3 (three) times daily before meals.   lisinopril (PRINIVIL,ZESTRIL) 10 MG tablet Take 10 mg by mouth daily.   NOVOFINE 32G X 6 MM MISC USE ONE AS DIRECTED TWICE DAILY.   pantoprazole (PROTONIX) 40 MG tablet Take 1 tablet (40 mg total) by mouth 2 (two) times daily before a meal.   propranolol (INDERAL) 10 MG tablet TAKE THREE (3) TABLETS BY MOUTH EVERY MORNING AND TAKE THREE (3) TABLETS BY MOUTH EVERY EVENING   ULTICARE PEN NEEDLES 31G X 5 MM MISC USE ONE AS DIRECTED TWICE DAILY.   VICTOZA 18 MG/3ML SOLN Inject 1.8 mg into the skin daily.   [DISCONTINUED] TRESIBA FLEXTOUCH 200 UNIT/ML FlexTouch Pen INJECT 70 UNITS INTO THE SKIN AT BEDTIME (Patient taking differently: Inject 60 Units into the skin at bedtime.)   No facility-administered encounter medications on file as of 08/25/2021.    ALLERGIES: Allergies  Allergen Reactions   Codeine Nausea And Vomiting    VACCINATION STATUS:  There is no immunization history on file for this patient.  Diabetes She presents for her follow-up diabetic visit. She has type 2 diabetes mellitus. Onset  time: She was diagnosed at approximate age of 37 years. Her disease course has been improving. There are no hypoglycemic associated symptoms. Pertinent negatives for hypoglycemia include no confusion, headaches, pallor or seizures. Pertinent negatives for diabetes include no chest pain, no fatigue, no polydipsia, no polyphagia and no polyuria. (She did not bring any logs nor meter to review.  Patient verbally reports that she is having fluctuating glycemic profile including hypoglycemia.) There are no hypoglycemic complications. Symptoms are improving. Diabetic complications include nephropathy. Risk factors for coronary artery disease include diabetes mellitus, hypertension, obesity, post-menopausal and family history. Her weight is decreasing steadily. She is following a generally unhealthy diet. When asked about meal planning, she reported none. She has had a previous visit with a dietitian. She participates in exercise intermittently. Her home blood glucose trend is decreasing steadily. Her breakfast blood glucose range is generally 140-180 mg/dl. Her lunch blood glucose range is generally 140-180 mg/dl. Her dinner blood glucose range is generally 140-180 mg/dl. Her bedtime blood glucose range is generally 140-180 mg/dl. Her overall blood glucose range is 140-180 mg/dl. Benjamine Mola presents with significant improvement in her glycemic profile.  Her blood glucose readings are near target, point-of-care A1c 7.6% improving overall from 8.7%.   Her most recent 2 weeks CGM shows average blood glucose of 217, 24% time range,75% above range. She has no sustained hypoglycemia.   ) An ACE inhibitor/angiotensin II receptor blocker is being taken. Eye exam is current.  Hypertension This is a chronic problem. The current episode started more than 1 year ago. The problem is controlled. Pertinent negatives include no chest pain, headaches, palpitations or shortness of breath. Risk factors for coronary artery disease  include diabetes mellitus. Past treatments include ACE inhibitors. Hypertensive end-organ damage includes kidney disease. Identifiable causes of hypertension include chronic renal disease.    Review of Systems  Constitutional:  Negative for chills, fatigue, fever and unexpected weight change.  HENT:  Negative for trouble swallowing and voice change.   Eyes:  Negative for visual disturbance.  Respiratory:  Negative for cough, shortness of breath and wheezing.   Cardiovascular:  Negative for chest pain, palpitations and leg swelling.  Gastrointestinal:  Negative for diarrhea, nausea and vomiting.  Endocrine: Negative  for cold intolerance, heat intolerance, polydipsia, polyphagia and polyuria.  Musculoskeletal:  Negative for arthralgias and myalgias.  Skin:  Negative for color change, pallor, rash and wound.  Neurological:  Negative for seizures and headaches.  Psychiatric/Behavioral:  Negative for confusion and suicidal ideas.    Objective:    Vitals with BMI 08/25/2021 06/03/2021 05/23/2021  Height 5' 1"  5' 1"  5' 1"   Weight 196 lbs 13 oz 192 lbs 192 lbs 13 oz  BMI 37.2 32.4 40.10  Systolic 272 536 644  Diastolic 78 72 66  Pulse 68 84 84    BP (!) 142/78   Pulse 68   Ht 5' 1"  (1.549 m)   Wt 196 lb 12.8 oz (89.3 kg)   BMI 37.19 kg/m   Wt Readings from Last 3 Encounters:  08/25/21 196 lb 12.8 oz (89.3 kg)  06/03/21 192 lb (87.1 kg)  05/23/21 192 lb 12.8 oz (87.5 kg)     Physical Exam Constitutional:      Appearance: She is well-developed.  HENT:     Head: Normocephalic and atraumatic.  Neck:     Thyroid: No thyromegaly.     Trachea: No tracheal deviation.  Cardiovascular:     Rate and Rhythm: Normal rate and regular rhythm.  Pulmonary:     Effort: Pulmonary effort is normal.  Abdominal:     Tenderness: There is no abdominal tenderness. There is no guarding.  Musculoskeletal:        General: Normal range of motion.     Cervical back: Normal range of motion and neck  supple.     Comments: Her foot exam was normal today.  Skin:    General: Skin is warm and dry.     Coloration: Skin is not pale.     Findings: No erythema or rash.  Neurological:     Mental Status: She is alert and oriented to person, place, and time.     Cranial Nerves: No cranial nerve deficit.     Coordination: Coordination normal.     Deep Tendon Reflexes: Reflexes are normal and symmetric.  Psychiatric:        Judgment: Judgment normal.      CMP ( most recent) CMP     Component Value Date/Time   NA 137 06/20/2021 1540   K 4.3 06/20/2021 1540   CL 110 06/20/2021 1540   CO2 23 06/20/2021 1540   GLUCOSE 275 (H) 06/20/2021 1540   BUN 14 06/20/2021 1540   BUN 15 01/25/2020 0000   CREATININE 1.47 (H) 06/20/2021 1540   CREATININE 1.62 (H) 03/28/2018 1146   CALCIUM 8.9 06/20/2021 1540   CALCIUM 9.1 01/25/2020 1059   PROT 6.5 06/20/2021 1540   ALBUMIN 3.5 06/20/2021 1540   AST 35 06/20/2021 1540   ALT 19 06/20/2021 1540   ALKPHOS 102 06/20/2021 1540   BILITOT 2.5 (H) 06/20/2021 1540   GFRNONAA 38 (L) 06/20/2021 1540   GFRAA 36 (L) 05/29/2020 1416     Diabetic Labs (most recent): Lab Results  Component Value Date   HGBA1C 7.6 (A) 08/25/2021   HGBA1C 8.7 (A) 04/29/2021   HGBA1C 8.8 (A) 12/23/2020     Lipid Panel ( most recent) Lipid Panel     Component Value Date/Time   CHOL 157 12/20/2020 1511   TRIG 121 12/20/2020 1511   HDL 42 12/20/2020 1511   CHOLHDL 3.7 12/20/2020 1511   VLDL 24 12/20/2020 1511   LDLCALC 91 12/20/2020 1511      Lab Results  Component Value Date   TSH 2.531 12/20/2020   TSH 2.46 01/25/2020   FREET4 0.97 12/20/2020      Assessment & Plan:   1. Uncontrolled type 2 diabetes mellitus with stage 3 chronic kidney disease ([place])  - Alisha Gobble Radford has currently uncontrolled symptomatic type 2 DM since 70 years of age.  Haydyn presents with significant improvement in her glycemic profile.  Her blood glucose readings are near  target, point-of-care A1c 7.6% improving overall from 8.7%.   Her most recent 2 weeks CGM shows average blood glucose of 217, 24% time range,75% above range. She has no sustained hypoglycemia.    Recent labs reviewed. - I had a long discussion with her about the progressive nature of diabetes and the pathology behind its complications. -her diabetes is complicated by CKD and she remains at a high risk for more acute and chronic complications which include CAD, CVA, CKD, retinopathy, and neuropathy. These are all discussed in detail with her.  - I have counseled her on diet  and weight management  by adopting a carbohydrate restricted/protein rich diet. Patient is encouraged to switch to  unprocessed or minimally processed     complex starch and increased protein intake (animal or plant source), fruits, and vegetables. -  she is advised to stick to a routine mealtimes to eat 3 meals  a day and avoid unnecessary snacks ( to snack only to correct hypoglycemia).    - she acknowledges that there is a room for improvement in her food and drink choices. - Suggestion is made for her to avoid simple carbohydrates  from her diet including Cakes, Sweet Desserts, Ice Cream, Soda (diet and regular), Sweet Tea, Candies, Chips, Cookies, Store Bought Juices, Alcohol , Artificial Sweeteners,  Coffee Creamer, and "Sugar-free" Products, Lemonade. This will help patient to have more stable blood glucose profile and potentially avoid unintended weight gain.  The following Lifestyle Medicine recommendations according to [place]  [facility]) were discussed and and offered to patient and she  agrees to start the journey:  A. Whole Foods, Plant-Based Nutrition comprising of fruits and vegetables, plant-based proteins, whole-grain carbohydrates was discussed in detail with the patient.   A list for source of those nutrients were also provided to the patient.  Patient will use only water or  unsweetened tea for hydration. B.  The need to stay away from risky substances including alcohol, smoking; obtaining 7 to 9 hours of restorative sleep, at least 150 minutes of moderate intensity exercise weekly, the importance of healthy social connections,  and stress management techniques were discussed. C.  A full color page of  Calorie density of various food groups per pound showing examples of each food groups was provided to the patient.    - she will be scheduled with Jearld Fenton, RDN, CDE for diabetes education.  - I have approached her with the following individualized plan to manage  her diabetes and patient agrees:   - she has required multiple daily injections of insulin to control diabetes in the past.   -While she will be working on her lifestyle, she is advised to continue Tresiba 60 units nightly, continue NovoLog   5  units 3 times daily AC for Premeal blood glucose readings above 90 mg per DL, associated with monitoring of blood glucose 4 times a day.  She is benefiting from her CGM.  She is advised to use her freestyle libre device at all times. - she is  warned not to take insulin without proper monitoring per orders. - she is encouraged to call clinic for blood glucose levels less than 70 or above 200 mg /dl. - she is advised to continue Victoza 1.8 mg of 3 CBC, advised to discontinue glipizide at this time.   - she is not a candidate for Metformin, SGLT2 inhibitors due to concurrent renal insufficiency.  - Specific targets for  A1c;  LDL, HDL,  and Triglycerides were discussed with the patient.  2) Blood Pressure /Hypertension:  -Her blood pressure is controlled to  near target. she is advised to continue her current medications including lisinopril 10 mg p.o. daily with breakfast .   3) Lipids/Hyperlipidemia:   Review of her recent lipid panel showed  controlled uncontrolled LDL at 91.    she  is not on statins.  She will be considered for fasting lipid panel and  low-dose statins on subsequent visits.    4)  Weight/Diet:  Body mass index is 37.19 kg/m.  -   clearly complicating her diabetes care.   she is  a candidate for weight loss. I discussed with her the fact that loss of 5 - 10% of her  current body weight will have the most impact on her diabetes management.  Exercise, and detailed carbohydrates information provided  -  detailed on discharge instructions.  5) Chronic Care/Health Maintenance:  -she  is on ACEI Statin medications and  is encouraged to initiate and continue to follow up with Ophthalmology, Dentist,  Podiatrist at least yearly or according to recommendations, and advised to   stay away from smoking. I have recommended yearly flu vaccine and pneumonia vaccine at least every 5 years; moderate intensity exercise for up to 150 minutes weekly; and  sleep for at least 7 hours a day.  6.  Vitamin D deficiency: She is advised to continue cholecalciferol 2000 units daily.   She recently had normal ABI, will be repeated in 5 years.  - she is  advised to maintain close follow up with Glenda Chroman, MD for primary care needs, as well as her other providers for optimal and coordinated care.   I spent 44 minutes in the care of the patient today including review of labs from [facility], Lipids, Thyroid Function, Hematology (current and previous including abstractions from other facilities); face-to-face time discussing  her blood glucose readings/logs, discussing hypoglycemia and hyperglycemia episodes and symptoms, medications doses, her options of short and long term treatment based on the latest standards of care / guidelines;  discussion about incorporating lifestyle medicine;  and documenting the encounter.    Please refer to Patient Instructions for Blood Glucose Monitoring and Insulin/Medications Dosing Guide"  in media tab for additional information. Please  also refer to " Patient Self Inventory" in the Media  tab for reviewed elements of pertinent  patient history.  Alisha Gobble Tasker participated in the discussions, expressed understanding, and voiced agreement with the above plans.  All questions were answered to her satisfaction. she is encouraged to contact clinic should she have any questions or concerns prior to her return visit.   Follow up plan: - Return in about 3 months (around 11/23/2021) for F/U with Pre-visit Labs, Meter, Logs, A1c here.Glade Lloyd, MD [facility] [address] [place], [place] [address] Phone: [phone]  Fax: [phone]    08/25/2021, 5:19 PM  This note was partially dictated with voice recognition software. Similar sounding words can be transcribed  inadequately or may not  be corrected upon review.

## 2021-09-01 DIAGNOSIS — C220 Liver cell carcinoma: Secondary | ICD-10-CM | POA: Diagnosis not present

## 2021-09-07 DIAGNOSIS — E1165 Type 2 diabetes mellitus with hyperglycemia: Secondary | ICD-10-CM | POA: Diagnosis not present

## 2021-09-16 ENCOUNTER — Other Ambulatory Visit: Payer: Self-pay

## 2021-09-16 ENCOUNTER — Inpatient Hospital Stay (HOSPITAL_COMMUNITY): Payer: Medicare PPO | Attending: Hematology

## 2021-09-16 DIAGNOSIS — E1122 Type 2 diabetes mellitus with diabetic chronic kidney disease: Secondary | ICD-10-CM | POA: Insufficient documentation

## 2021-09-16 DIAGNOSIS — I130 Hypertensive heart and chronic kidney disease with heart failure and stage 1 through stage 4 chronic kidney disease, or unspecified chronic kidney disease: Secondary | ICD-10-CM | POA: Diagnosis not present

## 2021-09-16 DIAGNOSIS — D509 Iron deficiency anemia, unspecified: Secondary | ICD-10-CM | POA: Insufficient documentation

## 2021-09-16 DIAGNOSIS — Z79899 Other long term (current) drug therapy: Secondary | ICD-10-CM | POA: Insufficient documentation

## 2021-09-16 DIAGNOSIS — E538 Deficiency of other specified B group vitamins: Secondary | ICD-10-CM | POA: Diagnosis not present

## 2021-09-16 DIAGNOSIS — Z794 Long term (current) use of insulin: Secondary | ICD-10-CM | POA: Diagnosis not present

## 2021-09-16 DIAGNOSIS — Z8719 Personal history of other diseases of the digestive system: Secondary | ICD-10-CM | POA: Diagnosis not present

## 2021-09-16 DIAGNOSIS — N1832 Chronic kidney disease, stage 3b: Secondary | ICD-10-CM | POA: Diagnosis not present

## 2021-09-16 DIAGNOSIS — E785 Hyperlipidemia, unspecified: Secondary | ICD-10-CM | POA: Insufficient documentation

## 2021-09-16 DIAGNOSIS — D696 Thrombocytopenia, unspecified: Secondary | ICD-10-CM | POA: Insufficient documentation

## 2021-09-16 DIAGNOSIS — Z800 Family history of malignant neoplasm of digestive organs: Secondary | ICD-10-CM | POA: Insufficient documentation

## 2021-09-16 DIAGNOSIS — K7581 Nonalcoholic steatohepatitis (NASH): Secondary | ICD-10-CM | POA: Diagnosis not present

## 2021-09-16 DIAGNOSIS — K7460 Unspecified cirrhosis of liver: Secondary | ICD-10-CM | POA: Insufficient documentation

## 2021-09-16 DIAGNOSIS — D500 Iron deficiency anemia secondary to blood loss (chronic): Secondary | ICD-10-CM

## 2021-09-16 LAB — CBC WITH DIFFERENTIAL/PLATELET
Abs Immature Granulocytes: 0.01 10*3/uL (ref 0.00–0.07)
Basophils Absolute: 0 10*3/uL (ref 0.0–0.1)
Basophils Relative: 0 %
Eosinophils Absolute: 0.1 10*3/uL (ref 0.0–0.5)
Eosinophils Relative: 2 %
HCT: 34.6 % — ABNORMAL LOW (ref 36.0–46.0)
Hemoglobin: 11.6 g/dL — ABNORMAL LOW (ref 12.0–15.0)
Immature Granulocytes: 0 %
Lymphocytes Relative: 13 %
Lymphs Abs: 0.5 10*3/uL — ABNORMAL LOW (ref 0.7–4.0)
MCH: 31.1 pg (ref 26.0–34.0)
MCHC: 33.5 g/dL (ref 30.0–36.0)
MCV: 92.8 fL (ref 80.0–100.0)
Monocytes Absolute: 0.4 10*3/uL (ref 0.1–1.0)
Monocytes Relative: 9 %
Neutro Abs: 3.2 10*3/uL (ref 1.7–7.7)
Neutrophils Relative %: 76 %
Platelets: 64 10*3/uL — ABNORMAL LOW (ref 150–400)
RBC: 3.73 MIL/uL — ABNORMAL LOW (ref 3.87–5.11)
RDW: 14.6 % (ref 11.5–15.5)
WBC: 4.2 10*3/uL (ref 4.0–10.5)
nRBC: 0 % (ref 0.0–0.2)

## 2021-09-16 LAB — COMPREHENSIVE METABOLIC PANEL
ALT: 17 U/L (ref 0–44)
AST: 32 U/L (ref 15–41)
Albumin: 3.3 g/dL — ABNORMAL LOW (ref 3.5–5.0)
Alkaline Phosphatase: 114 U/L (ref 38–126)
Anion gap: 4 — ABNORMAL LOW (ref 5–15)
BUN: 18 mg/dL (ref 8–23)
CO2: 24 mmol/L (ref 22–32)
Calcium: 8.8 mg/dL — ABNORMAL LOW (ref 8.9–10.3)
Chloride: 107 mmol/L (ref 98–111)
Creatinine, Ser: 1.64 mg/dL — ABNORMAL HIGH (ref 0.44–1.00)
GFR, Estimated: 33 mL/min — ABNORMAL LOW (ref >60)
Glucose, Bld: 277 mg/dL — ABNORMAL HIGH (ref 70–99)
Potassium: 4.6 mmol/L (ref 3.5–5.1)
Sodium: 135 mmol/L (ref 135–145)
Total Bilirubin: 2.7 mg/dL — ABNORMAL HIGH (ref 0.3–1.2)
Total Protein: 6.6 g/dL (ref 6.5–8.1)

## 2021-09-16 LAB — VITAMIN B12: Vitamin B-12: 977 pg/mL — ABNORMAL HIGH (ref 180–914)

## 2021-09-16 LAB — IRON AND TIBC
Iron: 88 ug/dL (ref 28–170)
Saturation Ratios: 37 % — ABNORMAL HIGH (ref 10.4–31.8)
TIBC: 239 ug/dL — ABNORMAL LOW (ref 250–450)
UIBC: 151 ug/dL

## 2021-09-17 DIAGNOSIS — C220 Liver cell carcinoma: Secondary | ICD-10-CM | POA: Diagnosis not present

## 2021-09-18 LAB — METHYLMALONIC ACID, SERUM: Methylmalonic Acid, Quantitative: 146 nmol/L (ref 0–378)

## 2021-09-22 ENCOUNTER — Other Ambulatory Visit (HOSPITAL_COMMUNITY): Payer: Self-pay | Admitting: Physician Assistant

## 2021-09-22 ENCOUNTER — Other Ambulatory Visit: Payer: Self-pay | Admitting: "Endocrinology

## 2021-09-22 DIAGNOSIS — D500 Iron deficiency anemia secondary to blood loss (chronic): Secondary | ICD-10-CM

## 2021-09-22 NOTE — Progress Notes (Signed)
[facility] [place], [place] [address]   CLINIC:  Medical Oncology/Hematology  PCP:  Glenda Chroman, MD [place] [address] [phone]   REASON FOR VISIT:  Follow-up for iron deficiency anemia (intermittent GI bleeding) and thrombocytopenia (cirrhosis)  CURRENT THERAPY: Intermittent IV iron infusions  INTERVAL HISTORY:  Alisha [name] 71 y.o. 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  She was last evaluated via telemedicine visit by Tarri Abernethy PA-C on 05/15/2021.   Since her last visit, she has undergone radioembolization of her hepatocellular carcinoma liver lesion.  Her [place] and other liver issues are being managed by [facility].  At today's visit, she reports feeling about the same.  Her chronic fatigue is at baseline.  She denies any recent bleeding such as hematemesis, hematochezia, melena, or epistaxis.  She denies any significant bruising or petechial rash.  She has not noticed any pica, restless legs, headaches, chest pain, dyspnea on exertion, lightheadedness, or syncope.  No B symptoms such as fever, chills, night sweats, unintentional weight loss.  She has 50% energy and 25% appetite. She endorses that she is maintaining a stable weight.   REVIEW OF SYSTEMS:  Review of Systems  Constitutional:  Positive for fatigue. Negative for appetite change, chills, diaphoresis, fever and unexpected weight change.  HENT:   Negative for lump/mass and nosebleeds.   Eyes:  Negative for eye problems.  Respiratory:  Negative for cough, hemoptysis and shortness of breath.   Cardiovascular:  Negative for chest pain, leg swelling and palpitations.  Gastrointestinal:  Negative for abdominal pain, blood in stool, constipation, diarrhea, nausea and vomiting.  Genitourinary:  Negative for hematuria.   Skin: Negative.   Neurological:   Negative for dizziness, headaches and light-headedness.  Hematological:  Does not bruise/bleed easily.     PAST MEDICAL/SURGICAL HISTORY:  Past Medical History:  Diagnosis Date   B12 deficiency 11/03/2016   B12 deficiency 11/03/2016   Barrett's esophagus    Cirrhosis of liver without mention of alcohol    hep B surface antigen and HCV ab negative.pt has not had hepatitis A and B vaccines.U/S on 07/04/13 shows cirrhosis.   Diverticula of colon    pancolonic   Esophageal reflux    Esophageal varices (HCC)    Hiatal hernia    Iron deficiency anemia, unspecified    Other and unspecified hyperlipidemia    Portal hypertensive gastropathy (HCC)    Tubular adenoma    Type II or unspecified type diabetes mellitus without mention of complication, not stated as uncontrolled    Unspecified essential hypertension    Unspecified hemorrhoids without mention of complication    Past Surgical History:  Procedure Laterality Date   ABDOMINAL HYSTERECTOMY     BIOPSY  04/30/2016   Procedure: BIOPSY;  Surgeon: Daneil Dolin, MD;  [facility];  Service: Endoscopy;;  esophagus   BIOPSY  02/24/2019   Procedure: BIOPSY;  Surgeon: Daneil Dolin, MD;  [facility];  Service: Endoscopy;;   CESAREAN SECTION     x 2   COLONOSCOPY  03/2006   left sided diverticula, splenic flexure tublar adenoma   COLONOSCOPY  07/2002   villous tubular adenoma in rectum   COLONOSCOPY  09/23/09   external hemorrhoids/scattered pan colonic diverticula otherwise normal. cecal lipoma bx negative, normal TI. Next TCS 09/2014   COLONOSCOPY N/A 11/12/2014   Procedure:  COLONOSCOPY;  Surgeon: Daneil Dolin, MD;  [facility];  Service: Endoscopy;  Laterality: N/A;  1215 - moved to 12:30 - Ginger to notify pt   COLONOSCOPY N/A 01/07/2017   Rourk: Grade 4 internal hemorrhoids, 5 mm ulcer in the sigmoid colon (benign bx), 7 mm polyp removed from the cecum (tubular adenoma), diverticulosis.next tcs in five  years   ESOPHAGEAL BANDING N/A 12/20/2016   Procedure: ESOPHAGEAL BANDING;  Surgeon: Danie Binder, MD;  [facility];  Service: Endoscopy;  Laterality: N/A;   ESOPHAGEAL BANDING N/A 03/18/2017   Procedure: ESOPHAGEAL BANDING;  Surgeon: Daneil Dolin, MD;  [facility];  Service: Endoscopy;  Laterality: N/A;   ESOPHAGEAL BANDING  12/17/2017   Procedure: ESOPHAGEAL BANDING;  Surgeon: Daneil Dolin, MD;  [facility];  Service: Endoscopy;;   ESOPHAGEAL BANDING N/A 07/03/2020   Procedure: ESOPHAGEAL BANDING;  Surgeon: Daneil Dolin, MD;  [facility];  Service: Endoscopy;  Laterality: N/A;   ESOPHAGOGASTRODUODENOSCOPY  08/2006   barretts esophagus, no dyplasia   ESOPHAGOGASTRODUODENOSCOPY  03/2006   barretts esophagus,bx focal atypia c/w low grade dysplasia, SB bx negative for celiac   ESOPHAGOGASTRODUODENOSCOPY  09/23/09   3 columns of grade 2 esophageal varices/hiatal hernia/3-4 cm segment Barrett's esophagus without dysplasia. Next EGD 09/2012   ESOPHAGOGASTRODUODENOSCOPY N/A 11/11/2012   Dr. Gala Romney- Barrett;s esophagus, esophageal varices, portal gastopathy. hiatal hernia   ESOPHAGOGASTRODUODENOSCOPY N/A 04/30/2016   Procedure: ESOPHAGOGASTRODUODENOSCOPY (EGD);  Surgeon: Daneil Dolin, MD;  [facility];  Service: Endoscopy;  Laterality: N/A;  815   ESOPHAGOGASTRODUODENOSCOPY N/A 12/20/2016   Procedure: ESOPHAGOGASTRODUODENOSCOPY (EGD);  Surgeon: Danie Binder, MD;  [facility];  Service: Endoscopy;  Laterality: N/A;   ESOPHAGOGASTRODUODENOSCOPY N/A 01/07/2017   Procedure: ESOPHAGOGASTRODUODENOSCOPY (EGD);  Surgeon: Daneil Dolin, MD;  [facility];  Service: Endoscopy;  Laterality: N/A;   ESOPHAGOGASTRODUODENOSCOPY N/A 03/18/2017   Procedure: ESOPHAGOGASTRODUODENOSCOPY (EGD);  Surgeon: Daneil Dolin, MD;  [facility];  Service: Endoscopy;  Laterality: N/A;  1030    ESOPHAGOGASTRODUODENOSCOPY N/A 12/17/2017    Procedure: ESOPHAGOGASTRODUODENOSCOPY (EGD);  Surgeon: Daneil Dolin, MD;  [facility];  Service: Endoscopy;  Laterality: N/A;  10:30am   ESOPHAGOGASTRODUODENOSCOPY N/A 02/24/2019   Rourk: No esophageal varices seen, scars noted from prior banding.  5 cm segment of salmon-colored epithelium coming up from the GE junction consistent with Barrett's (bx with no dysplasia).  Portal gastropathy.  Small hiatal hernia.  Recommended repeat EGD in 18 months.   ESOPHAGOGASTRODUODENOSCOPY N/A 07/03/2020   Procedure: ESOPHAGOGASTRODUODENOSCOPY (EGD);  Surgeon: Daneil Dolin, MD;  [facility];  Service: Endoscopy;  Laterality: N/A;  10:15am   HEMORRHOID SURGERY  2008   small bowel capsule endoscopy  10/2009   normal     SOCIAL HISTORY:  Social History   Socioeconomic History   Marital status: Married    Spouse name: Not on file   Number of children: Not on file   Years of education: Not on file   Highest education level: Not on file  Occupational History   Not on file  Tobacco Use   Smoking status: Never   Smokeless tobacco: Never  Vaping Use   Vaping Use: Never used  Substance and Sexual Activity   Alcohol use: No   Drug use: No   Sexual activity: Yes  Other Topics Concern   Not on file  Social History Narrative  Not on file   Social Determinants of Health   Financial Resource Strain: Not on file  Food Insecurity: Not on file  Transportation Needs: Not on file  Physical Activity: Not on file  Stress: Not on file  Social Connections: Not on file  Intimate Partner Violence: Not on file    FAMILY HISTORY:  Family History  Problem Relation Age of Onset   Alzheimer's disease Mother    CAD Father    Diabetes Mellitus II Father    Alzheimer's disease Father    Diabetes Mellitus II Sister    Colon cancer Sister 63       small, surgical excision; chemo/rad not needed   Cancer - Other Brother    Diabetes Mellitus II Brother    Hypertension Brother     Liver disease Neg Hx     CURRENT MEDICATIONS:  Outpatient Encounter Medications as of 09/23/2021  Medication Sig   Accu-Chek Softclix Lancets lancets USE TO CHECK BLOOD SUGAR FOUR TIMES DAILY. Dx: E11.65   Cholecalciferol (VITAMIN D) 2000 units tablet Take 2,000 Units by mouth every other day.   Continuous Blood Gluc Receiver (FREESTYLE LIBRE 2 READER) DEVI As directed   Continuous Blood Gluc Sensor (FREESTYLE LIBRE 2 SENSOR) MISC 1 Piece by Does not apply route every 14 (fourteen) days.   doxycycline (VIBRAMYCIN) 100 MG capsule Take 100 mg by mouth daily. (Patient not taking: Reported on 08/25/2021)   insulin aspart (NOVOLOG FLEXPEN) 100 UNIT/ML FlexPen Inject 5-11 Units into the skin 3 (three) times daily before meals.   insulin degludec (TRESIBA FLEXTOUCH) 200 UNIT/ML FlexTouch Pen Inject 60 Units into the skin at bedtime.   lisinopril (PRINIVIL,ZESTRIL) 10 MG tablet Take 10 mg by mouth daily.   NOVOFINE 32G X 6 MM MISC USE ONE AS DIRECTED TWICE DAILY.   pantoprazole (PROTONIX) 40 MG tablet Take 1 tablet (40 mg total) by mouth 2 (two) times daily before a meal.   propranolol (INDERAL) 10 MG tablet TAKE THREE (3) TABLETS BY MOUTH EVERY MORNING AND TAKE THREE (3) TABLETS BY MOUTH EVERY EVENING   ULTICARE PEN NEEDLES 31G X 5 MM MISC USE ONE AS DIRECTED TWICE DAILY.   VICTOZA 18 MG/3ML SOLN Inject 1.8 mg into the skin daily.   No facility-administered encounter medications on file as of 09/23/2021.    ALLERGIES:  Allergies  Allergen Reactions   Codeine Nausea And Vomiting     PHYSICAL EXAM:  ECOG PERFORMANCE STATUS: 1 - Symptomatic but completely ambulatory  There were no vitals filed for this visit. There were no vitals filed for this visit. Physical Exam Constitutional:      Appearance: Normal appearance. She is obese.  HENT:     Head: Normocephalic and atraumatic.     Mouth/Throat:     Mouth: Mucous membranes are moist.  Eyes:     Extraocular Movements: Extraocular  movements intact.     Pupils: Pupils are equal, round, and reactive to light.  Cardiovascular:     Rate and Rhythm: Normal rate and regular rhythm.     Pulses: Normal pulses.     Heart sounds: Normal heart sounds.  Pulmonary:     Effort: Pulmonary effort is normal.     Breath sounds: Normal breath sounds.  Abdominal:     General: Bowel sounds are normal.     Palpations: Abdomen is soft.     Tenderness: There is no abdominal tenderness.  Musculoskeletal:        General: No swelling.  Right lower leg: No edema.     Left lower leg: No edema.  Lymphadenopathy:     Cervical: No cervical adenopathy.  Skin:    General: Skin is warm and dry.  Neurological:     General: No focal deficit present.     Mental Status: She is alert and oriented to person, place, and time.  Psychiatric:        Mood and Affect: Mood normal.        Behavior: Behavior normal.     LABORATORY DATA:  I have reviewed the labs as listed.  CBC    Component Value Date/Time   WBC 4.2 09/16/2021 1241   RBC 3.73 (L) 09/16/2021 1241   HGB 11.6 (L) 09/16/2021 1241   HCT 34.6 (L) 09/16/2021 1241   PLT 64 (L) 09/16/2021 1241   MCV 92.8 09/16/2021 1241   MCH 31.1 09/16/2021 1241   MCHC 33.5 09/16/2021 1241   RDW 14.6 09/16/2021 1241   LYMPHSABS 0.5 (L) 09/16/2021 1241   MONOABS 0.4 09/16/2021 1241   EOSABS 0.1 09/16/2021 1241   BASOSABS 0.0 09/16/2021 1241   CMP Latest Ref Rng & Units 09/16/2021 06/20/2021 05/07/2021  Glucose 70 - 99 mg/dL 277(H) 275(H) 249(H)  BUN 8 - 23 mg/dL 18 14 17   Creatinine 0.44 - 1.00 mg/dL 1.64(H) 1.47(H) 1.54(H)  Sodium 135 - 145 mmol/L 135 137 137  Potassium 3.5 - 5.1 mmol/L 4.6 4.3 4.3  Chloride 98 - 111 mmol/L 107 110 110  CO2 22 - 32 mmol/L 24 23 24   Calcium 8.9 - 10.3 mg/dL 8.8(L) 8.9 8.5(L)  Total Protein 6.5 - 8.1 g/dL 6.6 6.5 6.5  Total Bilirubin 0.3 - 1.2 mg/dL 2.7(H) 2.5(H) 3.4(H)  Alkaline Phos 38 - 126 U/L 114 102 100  AST 15 - 41 U/L 32 35 31  ALT 0 - 44 U/L 17 19  19     DIAGNOSTIC IMAGING:  I have independently reviewed the relevant imaging and discussed with the patient.  ASSESSMENT & PLAN: 1.  Iron deficiency anemia secondary to chronic intermittent GI bleeding - She has a history of GI bleeding related to portal gastropathy and gastroesophageal varices, has required multiple banding procedures in the past - Malabsorption of iron due to gastropathy as well as PPI use twice daily - Patient is not on oral iron supplementation - Requires intermittent IV Feraheme, most recently given in September 2021 - Most recent labs (09/16/2021) with Hgb 11.6.  Iron saturation 37%.  Ferritin 249. - Denies recent bleeding events, no melena or bright red blood per rectum    - PLAN:  No indication for IV iron supplementation at this time. - Repeat labs and RTC in 6 months, or sooner if indicated.   2.  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   3.  Vitamin B12 deficiency -Most recent labs (09/16/2021) show B12 at 977 with normal methylmalonic acid - Patient is currently not taking any vitamin B12 supplements (these were stopped by her endocrinologist) - PLAN: Recheck vitamin B12 and methylmalonic acid in 6 months.   4.  CKD stage IIIb/IV - Creatinine stable at baseline, 1.64 on 09/16/2021 with GFR 33   -  Follows with Dr. Theador Hawthorne   5.  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  Other history - PMH: Cirrhosis, chronic kidney disease, congestive heart failure, type 2 diabetes, hypertension - SOCIAL: Lifelong non-smoker.  No tobacco, alcohol, illicit drug use.   PLAN SUMMARY & DISPOSITION: Labs in 6 months RTC after labs  All questions were answered. The patient knows to call the clinic with any problems, questions or concerns.  Medical decision making: Moderate  Time spent on visit: I spent 20 minutes counseling the patient face to face. The total time spent in the appointment was 30 minutes and more than 50% was on counseling.   [name], PA-C  09/23/2021 1:54 PM

## 2021-09-23 ENCOUNTER — Inpatient Hospital Stay (HOSPITAL_COMMUNITY): Payer: Medicare PPO

## 2021-09-23 ENCOUNTER — Other Ambulatory Visit: Payer: Self-pay

## 2021-09-23 ENCOUNTER — Inpatient Hospital Stay (HOSPITAL_COMMUNITY): Payer: Medicare PPO | Admitting: Physician Assistant

## 2021-09-23 VITALS — BP 131/57 | HR 75 | Temp 97.5°F | Resp 17 | Ht 61.0 in | Wt 191.4 lb

## 2021-09-23 DIAGNOSIS — D500 Iron deficiency anemia secondary to blood loss (chronic): Secondary | ICD-10-CM

## 2021-09-23 DIAGNOSIS — E538 Deficiency of other specified B group vitamins: Secondary | ICD-10-CM | POA: Diagnosis not present

## 2021-09-23 DIAGNOSIS — N1832 Chronic kidney disease, stage 3b: Secondary | ICD-10-CM | POA: Diagnosis not present

## 2021-09-23 DIAGNOSIS — I130 Hypertensive heart and chronic kidney disease with heart failure and stage 1 through stage 4 chronic kidney disease, or unspecified chronic kidney disease: Secondary | ICD-10-CM | POA: Diagnosis not present

## 2021-09-23 DIAGNOSIS — Z8719 Personal history of other diseases of the digestive system: Secondary | ICD-10-CM | POA: Diagnosis not present

## 2021-09-23 DIAGNOSIS — K7581 Nonalcoholic steatohepatitis (NASH): Secondary | ICD-10-CM | POA: Diagnosis not present

## 2021-09-23 DIAGNOSIS — D696 Thrombocytopenia, unspecified: Secondary | ICD-10-CM

## 2021-09-23 DIAGNOSIS — E785 Hyperlipidemia, unspecified: Secondary | ICD-10-CM | POA: Diagnosis not present

## 2021-09-23 DIAGNOSIS — K7460 Unspecified cirrhosis of liver: Secondary | ICD-10-CM | POA: Diagnosis not present

## 2021-09-23 DIAGNOSIS — D509 Iron deficiency anemia, unspecified: Secondary | ICD-10-CM | POA: Diagnosis not present

## 2021-09-23 LAB — FERRITIN: Ferritin: 249 ng/mL (ref 11–307)

## 2021-09-23 NOTE — Progress Notes (Signed)
Patient called and is aware.

## 2021-09-23 NOTE — Patient Instructions (Signed)
[place] at [facility] Discharge Instructions  You were seen today by Tarri Abernethy PA-C for your iron deficiency, B12, and low platelets.  Your levels are stable, and we will re-check in 6 months.    ** We are still waiting for the lab results of your ferritin (iron stores).  We will call you with these results and let you know if we need to schedule any additional treatment based on your levels.  LABS: Return in 6 months for repeat labs   OTHER TESTS: None  MEDICATIONS: No changes.  FOLLOW-UP APPOINTMENT: Continue follow-up at [facility].  Return to [facility] in 6 months.   Thank you for choosing [place] at [facility] to provide your oncology and hematology care.  To afford each patient quality time with our provider, please arrive at least 15 minutes before your scheduled appointment time.   If you have a lab appointment with the [facility] please come in thru the Main Entrance and check in at the main information desk.  You need to re-schedule your appointment should you arrive 10 or more minutes late.  We strive to give you quality time with our providers, and arriving late affects you and other patients whose appointments are after yours.  Also, if you no show three or more times for appointments you may be dismissed from the clinic at the providers discretion.     Again, thank you for choosing [facility].  Our hope is that these requests will decrease the amount of time that you wait before being seen by our physicians.       _____________________________________________________________  Should you have questions after your visit to [facility], please contact our office at [phone] and follow the prompts.  Our office hours are 8:00 a.m. and 4:30 p.m. Monday - Friday.  Please note that voicemails left after 4:00 p.m. may not be returned until the following business day.  We are closed  weekends and major holidays.  You do have access to a nurse 24-7, just call the main number to the clinic [phone] and do not press any options, hold on the line and a nurse will answer the phone.    For prescription refill requests, have your pharmacy contact our office and allow 72 hours.    Due to Covid, you will need to wear a mask upon entering the hospital. If you do not have a mask, a mask will be given to you at the Main Entrance upon arrival. For doctor visits, patients may have 1 support person age 22 or older with them. For treatment visits, patients can not have anyone with them due to social distancing guidelines and our immunocompromised population.

## 2021-10-08 DIAGNOSIS — E1165 Type 2 diabetes mellitus with hyperglycemia: Secondary | ICD-10-CM | POA: Diagnosis not present

## 2021-10-16 ENCOUNTER — Other Ambulatory Visit (HOSPITAL_COMMUNITY)
Admission: RE | Admit: 2021-10-16 | Discharge: 2021-10-16 | Disposition: A | Discharge status: Home / Self Care | Payer: Medicare PPO | Source: Ambulatory Visit | Attending: Nephrology | Admitting: Nephrology

## 2021-10-16 ENCOUNTER — Other Ambulatory Visit: Payer: Self-pay

## 2021-10-16 DIAGNOSIS — N189 Chronic kidney disease, unspecified: Secondary | ICD-10-CM | POA: Insufficient documentation

## 2021-10-16 DIAGNOSIS — E1122 Type 2 diabetes mellitus with diabetic chronic kidney disease: Secondary | ICD-10-CM | POA: Diagnosis not present

## 2021-10-16 DIAGNOSIS — D500 Iron deficiency anemia secondary to blood loss (chronic): Secondary | ICD-10-CM | POA: Diagnosis not present

## 2021-10-16 DIAGNOSIS — D631 Anemia in chronic kidney disease: Secondary | ICD-10-CM | POA: Insufficient documentation

## 2021-10-16 DIAGNOSIS — D696 Thrombocytopenia, unspecified: Secondary | ICD-10-CM | POA: Insufficient documentation

## 2021-10-16 DIAGNOSIS — I129 Hypertensive chronic kidney disease with stage 1 through stage 4 chronic kidney disease, or unspecified chronic kidney disease: Secondary | ICD-10-CM | POA: Insufficient documentation

## 2021-10-16 DIAGNOSIS — I10 Essential (primary) hypertension: Secondary | ICD-10-CM | POA: Insufficient documentation

## 2021-10-16 LAB — CBC
HCT: 34.6 % — ABNORMAL LOW (ref 36.0–46.0)
Hemoglobin: 11.4 g/dL — ABNORMAL LOW (ref 12.0–15.0)
MCH: 29.8 pg (ref 26.0–34.0)
MCHC: 32.9 g/dL (ref 30.0–36.0)
MCV: 90.3 fL (ref 80.0–100.0)
RBC: 3.83 MIL/uL — ABNORMAL LOW (ref 3.87–5.11)
RDW: 14.2 % (ref 11.5–15.5)
WBC: 5.7 10*3/uL (ref 4.0–10.5)
nRBC: 0 % (ref 0.0–0.2)

## 2021-10-16 LAB — RENAL FUNCTION PANEL
Albumin: 3.2 g/dL — ABNORMAL LOW (ref 3.5–5.0)
Anion gap: 4 — ABNORMAL LOW (ref 5–15)
BUN: 15 mg/dL (ref 8–23)
CO2: 26 mmol/L (ref 22–32)
Calcium: 8.9 mg/dL (ref 8.9–10.3)
Chloride: 102 mmol/L (ref 98–111)
Creatinine, Ser: 1.59 mg/dL — ABNORMAL HIGH (ref 0.44–1.00)
GFR, Estimated: 35 mL/min — ABNORMAL LOW (ref >60)
Glucose, Bld: 268 mg/dL — ABNORMAL HIGH (ref 70–99)
Phosphorus: 2.4 mg/dL — ABNORMAL LOW (ref 2.5–4.6)
Potassium: 4.4 mmol/L (ref 3.5–5.1)
Sodium: 132 mmol/L — ABNORMAL LOW (ref 135–145)

## 2021-10-16 LAB — IRON AND TIBC
Iron: 86 ug/dL (ref 28–170)
Saturation Ratios: 40 % — ABNORMAL HIGH (ref 10.4–31.8)
TIBC: 216 ug/dL — ABNORMAL LOW (ref 250–450)
UIBC: 130 ug/dL

## 2021-10-16 LAB — PROTEIN / CREATININE RATIO, URINE
Creatinine, Urine: 577.6 mg/dL
Protein Creatinine Ratio: 0.04 mg/mg{Cre} (ref 0.00–0.15)
Total Protein, Urine: 25 mg/dL

## 2021-10-16 LAB — VITAMIN D 25 HYDROXY (VIT D DEFICIENCY, FRACTURES): Vit D, 25-Hydroxy: 59.62 ng/mL (ref 30–100)

## 2021-10-16 LAB — FERRITIN: Ferritin: 345 ng/mL — ABNORMAL HIGH (ref 11–307)

## 2021-10-17 LAB — PTH, INTACT AND CALCIUM
Calcium, Total (PTH): 8.8 mg/dL (ref 8.7–10.3)
PTH: 20 pg/mL (ref 15–65)

## 2021-10-22 DIAGNOSIS — N189 Chronic kidney disease, unspecified: Secondary | ICD-10-CM | POA: Diagnosis not present

## 2021-10-22 DIAGNOSIS — E871 Hypo-osmolality and hyponatremia: Secondary | ICD-10-CM | POA: Diagnosis not present

## 2021-10-22 DIAGNOSIS — D631 Anemia in chronic kidney disease: Secondary | ICD-10-CM | POA: Diagnosis not present

## 2021-10-22 DIAGNOSIS — E1122 Type 2 diabetes mellitus with diabetic chronic kidney disease: Secondary | ICD-10-CM | POA: Diagnosis not present

## 2021-10-22 DIAGNOSIS — D696 Thrombocytopenia, unspecified: Secondary | ICD-10-CM | POA: Diagnosis not present

## 2021-11-05 DIAGNOSIS — C220 Liver cell carcinoma: Secondary | ICD-10-CM | POA: Diagnosis not present

## 2021-11-05 DIAGNOSIS — K7469 Other cirrhosis of liver: Secondary | ICD-10-CM | POA: Diagnosis not present

## 2021-11-05 DIAGNOSIS — K7581 Nonalcoholic steatohepatitis (NASH): Secondary | ICD-10-CM | POA: Diagnosis not present

## 2021-11-08 DIAGNOSIS — E1165 Type 2 diabetes mellitus with hyperglycemia: Secondary | ICD-10-CM | POA: Diagnosis not present

## 2021-11-24 ENCOUNTER — Other Ambulatory Visit (HOSPITAL_COMMUNITY)
Admission: RE | Admit: 2021-11-24 | Discharge: 2021-11-24 | Disposition: A | Discharge status: Home / Self Care | Payer: Medicare PPO | Source: Ambulatory Visit | Attending: Physician Assistant | Admitting: Physician Assistant

## 2021-11-24 ENCOUNTER — Other Ambulatory Visit (HOSPITAL_COMMUNITY)
Admission: RE | Admit: 2021-11-24 | Discharge: 2021-11-24 | Disposition: A | Discharge status: Home / Self Care | Payer: Medicare PPO | Source: Ambulatory Visit | Attending: "Endocrinology | Admitting: "Endocrinology

## 2021-11-24 DIAGNOSIS — Z794 Long term (current) use of insulin: Secondary | ICD-10-CM | POA: Insufficient documentation

## 2021-11-24 DIAGNOSIS — D500 Iron deficiency anemia secondary to blood loss (chronic): Secondary | ICD-10-CM | POA: Diagnosis not present

## 2021-11-24 DIAGNOSIS — E1169 Type 2 diabetes mellitus with other specified complication: Secondary | ICD-10-CM | POA: Diagnosis not present

## 2021-11-24 DIAGNOSIS — D696 Thrombocytopenia, unspecified: Secondary | ICD-10-CM | POA: Insufficient documentation

## 2021-11-24 DIAGNOSIS — E538 Deficiency of other specified B group vitamins: Secondary | ICD-10-CM | POA: Insufficient documentation

## 2021-11-24 LAB — COMPREHENSIVE METABOLIC PANEL
ALT: 18 U/L (ref 0–44)
AST: 30 U/L (ref 15–41)
Albumin: 3.3 g/dL — ABNORMAL LOW (ref 3.5–5.0)
Alkaline Phosphatase: 105 U/L (ref 38–126)
Anion gap: 9 (ref 5–15)
BUN: 17 mg/dL (ref 8–23)
CO2: 25 mmol/L (ref 22–32)
Calcium: 9.2 mg/dL (ref 8.9–10.3)
Chloride: 107 mmol/L (ref 98–111)
Creatinine, Ser: 1.62 mg/dL — ABNORMAL HIGH (ref 0.44–1.00)
GFR, Estimated: 34 mL/min — ABNORMAL LOW (ref >60)
Glucose, Bld: 138 mg/dL — ABNORMAL HIGH (ref 70–99)
Potassium: 4.2 mmol/L (ref 3.5–5.1)
Sodium: 141 mmol/L (ref 135–145)
Total Bilirubin: 3.9 mg/dL — ABNORMAL HIGH (ref 0.3–1.2)
Total Protein: 6.8 g/dL (ref 6.5–8.1)

## 2021-11-24 LAB — CBC WITH DIFFERENTIAL/PLATELET
Abs Immature Granulocytes: 0.01 10*3/uL (ref 0.00–0.07)
Basophils Absolute: 0 10*3/uL (ref 0.0–0.1)
Basophils Relative: 0 %
Eosinophils Absolute: 0.1 10*3/uL (ref 0.0–0.5)
Eosinophils Relative: 3 %
HCT: 34.3 % — ABNORMAL LOW (ref 36.0–46.0)
Hemoglobin: 11.7 g/dL — ABNORMAL LOW (ref 12.0–15.0)
Immature Granulocytes: 0 %
Lymphocytes Relative: 18 %
Lymphs Abs: 0.8 10*3/uL (ref 0.7–4.0)
MCH: 31 pg (ref 26.0–34.0)
MCHC: 34.1 g/dL (ref 30.0–36.0)
MCV: 91 fL (ref 80.0–100.0)
Monocytes Absolute: 0.3 10*3/uL (ref 0.1–1.0)
Monocytes Relative: 6 %
Neutro Abs: 3.2 10*3/uL (ref 1.7–7.7)
Neutrophils Relative %: 73 %
Platelets: 58 10*3/uL — ABNORMAL LOW (ref 150–400)
RBC: 3.77 MIL/uL — ABNORMAL LOW (ref 3.87–5.11)
RDW: 14.9 % (ref 11.5–15.5)
WBC: 4.5 10*3/uL (ref 4.0–10.5)
nRBC: 0 % (ref 0.0–0.2)

## 2021-11-24 LAB — FERRITIN: Ferritin: 318 ng/mL — ABNORMAL HIGH (ref 11–307)

## 2021-11-24 LAB — LIPID PANEL
Cholesterol: 143 mg/dL (ref 0–200)
HDL: 41 mg/dL (ref >40)
LDL Cholesterol: 82 mg/dL (ref 0–99)
Total CHOL/HDL Ratio: 3.5 RATIO
Triglycerides: 98 mg/dL (ref <150)
VLDL: 20 mg/dL (ref 0–40)

## 2021-11-24 LAB — IRON AND TIBC
Iron: 123 ug/dL (ref 28–170)
Saturation Ratios: 52 % — ABNORMAL HIGH (ref 10.4–31.8)
TIBC: 236 ug/dL — ABNORMAL LOW (ref 250–450)
UIBC: 113 ug/dL

## 2021-11-24 LAB — T4, FREE: Free T4: 0.92 ng/dL (ref 0.61–1.12)

## 2021-11-24 LAB — VITAMIN B12: Vitamin B-12: 832 pg/mL (ref 180–914)

## 2021-11-24 LAB — TSH: TSH: 2.577 u[IU]/mL (ref 0.350–4.500)

## 2021-11-25 ENCOUNTER — Other Ambulatory Visit: Payer: Self-pay | Admitting: "Endocrinology

## 2021-11-26 ENCOUNTER — Encounter: Payer: Self-pay | Admitting: "Endocrinology

## 2021-11-26 ENCOUNTER — Other Ambulatory Visit: Payer: Self-pay

## 2021-11-26 ENCOUNTER — Ambulatory Visit: Payer: Medicare PPO | Admitting: "Endocrinology

## 2021-11-26 VITALS — BP 126/62 | HR 68 | Ht 61.0 in | Wt 186.2 lb

## 2021-11-26 DIAGNOSIS — E1169 Type 2 diabetes mellitus with other specified complication: Secondary | ICD-10-CM | POA: Diagnosis not present

## 2021-11-26 DIAGNOSIS — E559 Vitamin D deficiency, unspecified: Secondary | ICD-10-CM

## 2021-11-26 DIAGNOSIS — Z794 Long term (current) use of insulin: Secondary | ICD-10-CM | POA: Diagnosis not present

## 2021-11-26 DIAGNOSIS — I10 Essential (primary) hypertension: Secondary | ICD-10-CM | POA: Diagnosis not present

## 2021-11-26 LAB — METHYLMALONIC ACID, SERUM: Methylmalonic Acid, Quantitative: 169 nmol/L (ref 0–378)

## 2021-11-26 LAB — POCT GLYCOSYLATED HEMOGLOBIN (HGB A1C): HbA1c, POC (controlled diabetic range): 9.3 % — AB (ref 0.0–7.0)

## 2021-11-26 MED ORDER — TRESIBA FLEXTOUCH 200 UNIT/ML ~~LOC~~ SOPN: 70.0000 [IU] | PEN_INJECTOR | Freq: Every day | SUBCUTANEOUS | 1 refills | Status: DC

## 2021-11-26 MED ORDER — NOVOLOG FLEXPEN 100 UNIT/ML ~~LOC~~ SOPN: 10.0000 [IU] | PEN_INJECTOR | Freq: Three times a day (TID) | SUBCUTANEOUS | 2 refills | Status: DC

## 2021-11-26 NOTE — Progress Notes (Signed)
? ?                                                    ?     11/26/2021, 5:08 PM ? ?Endocrinology follow-up note ? ? ?Subjective:  ? ? Patient ID: Alisha [name], female    DOB: [birth year].  ?Alisha [name] is being seen in follow-up after she was seen in consultation for management of currently uncontrolled symptomatic diabetes requested by  Glenda Chroman, MD. ? ? ?Past Medical History:  ?Diagnosis Date  ? B12 deficiency 11/03/2016  ? B12 deficiency 11/03/2016  ? Barrett's esophagus   ? Cirrhosis of liver without mention of alcohol   ? hep B surface antigen and HCV ab negative.pt has not had hepatitis A and B vaccines.U/S on 07/04/13 shows cirrhosis.  ? Diverticula of colon   ? pancolonic  ? Esophageal reflux   ? Esophageal varices (HCC)   ? Hiatal hernia   ? Iron deficiency anemia, unspecified   ? Other and unspecified hyperlipidemia   ? Portal hypertensive gastropathy (HCC)   ? Tubular adenoma   ? Type II or unspecified type diabetes mellitus without mention of complication, not stated as uncontrolled   ? Unspecified essential hypertension   ? Unspecified hemorrhoids without mention of complication   ? ? ?Past Surgical History:  ?Procedure Laterality Date  ? ABDOMINAL HYSTERECTOMY    ? BIOPSY  04/30/2016  ? Procedure: BIOPSY;  Surgeon: Daneil Dolin, MD;  [facility];  Service: Endoscopy;;  esophagus  ? BIOPSY  02/24/2019  ? Procedure: BIOPSY;  Surgeon: Daneil Dolin, MD;  [facility]: [facility];  Service: Endoscopy;;  ? CESAREAN SECTION    ? x 2  ? COLONOSCOPY  03/2006  ? left sided diverticula, splenic flexure tublar adenoma  ? COLONOSCOPY  07/2002  ? villous tubular adenoma in rectum  ? COLONOSCOPY  09/23/09  ? external hemorrhoids/scattered pan colonic diverticula otherwise normal. cecal lipoma bx negative, normal TI. Next TCS 09/2014  ? COLONOSCOPY N/A 11/12/2014  ? Procedure: COLONOSCOPY;  Surgeon: Daneil Dolin, MD;  [facility]: [facility];  Service: Endoscopy;   Laterality: N/A;  1215 - moved to 12:30 - Ginger to notify pt  ? COLONOSCOPY N/A 01/07/2017  ? Rourk: Grade 4 internal hemorrhoids, 5 mm ulcer in the sigmoid colon (benign bx), 7 mm polyp removed from the cecum (tubular adenoma), diverticulosis.next tcs in five years  ? ESOPHAGEAL BANDING N/A 12/20/2016  ? Procedure: ESOPHAGEAL BANDING;  Surgeon: Danie Binder, MD;  [facility];  Service: Endoscopy;  Laterality: N/A;  ? ESOPHAGEAL BANDING N/A 03/18/2017  ? Procedure: ESOPHAGEAL BANDING;  Surgeon: Daneil Dolin, MD;  [facility];  Service: Endoscopy;  Laterality: N/A;  ? ESOPHAGEAL BANDING  12/17/2017  ? Procedure: ESOPHAGEAL BANDING;  Surgeon: Daneil Dolin, MD;  [facility];  Service: Endoscopy;;  ? ESOPHAGEAL BANDING N/A 07/03/2020  ? Procedure: ESOPHAGEAL BANDING;  Surgeon: Daneil Dolin, MD;  [facility];  Service: Endoscopy;  Laterality: N/A;  ? ESOPHAGOGASTRODUODENOSCOPY  08/2006  ? barretts esophagus, no dyplasia  ? ESOPHAGOGASTRODUODENOSCOPY  03/2006  ? barretts esophagus,bx focal atypia c/w low grade dysplasia, SB bx negative for celiac  ? ESOPHAGOGASTRODUODENOSCOPY  09/23/09  ? 3 columns of grade 2 esophageal varices/hiatal  hernia/3-4 cm segment Barrett's esophagus without dysplasia. Next EGD 09/2012  ? ESOPHAGOGASTRODUODENOSCOPY N/A 11/11/2012  ? Dr. Gala Romney- Barrett;s esophagus, esophageal varices, portal gastopathy. hiatal hernia  ? ESOPHAGOGASTRODUODENOSCOPY N/A 04/30/2016  ? Procedure: ESOPHAGOGASTRODUODENOSCOPY (EGD);  Surgeon: Daneil Dolin, MD;  [facility];  Service: Endoscopy;  Laterality: N/A;  815  ? ESOPHAGOGASTRODUODENOSCOPY N/A 12/20/2016  ? Procedure: ESOPHAGOGASTRODUODENOSCOPY (EGD);  Surgeon: Danie Binder, MD;  [facility];  Service: Endoscopy;  Laterality: N/A;  ? ESOPHAGOGASTRODUODENOSCOPY N/A 01/07/2017  ? Procedure: ESOPHAGOGASTRODUODENOSCOPY (EGD);  Surgeon: Daneil Dolin, MD;  [facility];  Service:  Endoscopy;  Laterality: N/A;  ? ESOPHAGOGASTRODUODENOSCOPY N/A 03/18/2017  ? Procedure: ESOPHAGOGASTRODUODENOSCOPY (EGD);  Surgeon: Daneil Dolin, MD;  [facility];  Service: Endoscopy;  Laterality: N/A;  1030 ?  ? ESOPHAGOGASTRODUODENOSCOPY N/A 12/17/2017  ? Procedure: ESOPHAGOGASTRODUODENOSCOPY (EGD);  Surgeon: Daneil Dolin, MD;  [facility];  Service: Endoscopy;  Laterality: N/A;  10:30am  ? ESOPHAGOGASTRODUODENOSCOPY N/A 02/24/2019  ? Rourk: No esophageal varices seen, scars noted from prior banding.  5 cm segment of salmon-colored epithelium coming up from the GE junction consistent with Barrett's (bx with no dysplasia).  Portal gastropathy.  Small hiatal hernia.  Recommended repeat EGD in 18 months.  ? ESOPHAGOGASTRODUODENOSCOPY N/A 07/03/2020  ? Procedure: ESOPHAGOGASTRODUODENOSCOPY (EGD);  Surgeon: Daneil Dolin, MD;  [facility];  Service: Endoscopy;  Laterality: N/A;  10:15am  ? HEMORRHOID SURGERY  2008  ? small bowel capsule endoscopy  10/2009  ? normal  ? ? ?Social History  ? ?Socioeconomic History  ? Marital status: Married  ?  Spouse name: Not on file  ? Number of children: Not on file  ? Years of education: Not on file  ? Highest education level: Not on file  ?Occupational History  ? Not on file  ?Tobacco Use  ? Smoking status: Never  ? Smokeless tobacco: Never  ?Vaping Use  ? Vaping Use: Never used  ?Substance and Sexual Activity  ? Alcohol use: No  ? Drug use: No  ? Sexual activity: Yes  ?Other Topics Concern  ? Not on file  ?Social History Narrative  ? Not on file  ? ?Social Determinants of Health  ? ?Financial Resource Strain: Not on file  ?Food Insecurity: Not on file  ?Transportation Needs: Not on file  ?Physical Activity: Not on file  ?Stress: Not on file  ?Social Connections: Not on file  ? ? ?Family History  ?Problem Relation Age of Onset  ? Alzheimer's disease Mother   ? CAD Father   ? Diabetes Mellitus II Father   ? Alzheimer's disease Father   ?  Diabetes Mellitus II Sister   ? Colon cancer Sister 66  ?     small, surgical excision; chemo/rad not needed  ? Cancer - Other Brother   ? Diabetes Mellitus II Brother   ? Hypertension Brother   ? Liver disease Neg Hx   ? ? ?Outpatient Encounter Medications as of 11/26/2021  ?Medication Sig  ? Accu-Chek Softclix Lancets lancets USE TO CHECK BLOOD SUGAR FOUR TIMES DAILY. Dx: E11.65  ? Cholecalciferol (VITAMIN D) 2000 units tablet Take 2,000 Units by mouth every other day.  ? Continuous Blood Gluc Receiver (FREESTYLE LIBRE 2 READER) DEVI As directed  ? Continuous Blood Gluc Sensor (FREESTYLE LIBRE 2 SENSOR) MISC 1 Piece by Does not apply route every 14 (fourteen) days.  ? insulin aspart (NOVOLOG FLEXPEN) 100 UNIT/ML FlexPen Inject  10-16 Units into the skin 3 (three) times daily before meals.  ? insulin degludec (TRESIBA FLEXTOUCH) 200 UNIT/ML FlexTouch Pen Inject 70 Units into the skin at bedtime.  ? lisinopril (PRINIVIL,ZESTRIL) 10 MG tablet Take 10 mg by mouth daily.  ? NOVOFINE 32G X 6 MM MISC USE ONE AS DIRECTED TWICE DAILY.  ? pantoprazole (PROTONIX) 40 MG tablet Take 1 tablet (40 mg total) by mouth 2 (two) times daily before a meal.  ? propranolol (INDERAL) 10 MG tablet TAKE THREE (3) TABLETS BY MOUTH EVERY MORNING AND TAKE THREE (3) TABLETS BY MOUTH EVERY EVENING  ? ULTICARE PEN NEEDLES 31G X 5 MM MISC USE ONE AS DIRECTED TWICE DAILY.  ? VICTOZA 18 MG/3ML SOLN Inject 1.8 mg into the skin daily.  ? [DISCONTINUED] doxycycline (VIBRAMYCIN) 100 MG capsule Take 100 mg by mouth daily. (Patient not taking: Reported on 08/25/2021)  ? [DISCONTINUED] insulin aspart (NOVOLOG FLEXPEN) 100 UNIT/ML FlexPen Inject 5-11 Units into the skin 3 (three) times daily before meals.  ? [DISCONTINUED] TRESIBA FLEXTOUCH 200 UNIT/ML FlexTouch Pen INJECT 60 UNITS SUBCUTANEOUSLY AT BEDTIME  ? ?No facility-administered encounter medications on file as of 11/26/2021.  ? ? ?ALLERGIES: ?Allergies  ?Allergen Reactions  ? Codeine Nausea And  Vomiting  ? ? ?VACCINATION STATUS: ? ?There is no immunization history on file for this patient. ? ?Diabetes ?She presents for her follow-up diabetic visit. She has type 2 diabetes mellitus. Onset time: She was diagnosed at

## 2021-11-26 NOTE — Patient Instructions (Signed)

## 2021-12-03 DIAGNOSIS — K7581 Nonalcoholic steatohepatitis (NASH): Secondary | ICD-10-CM | POA: Diagnosis not present

## 2021-12-03 DIAGNOSIS — K7469 Other cirrhosis of liver: Secondary | ICD-10-CM | POA: Diagnosis not present

## 2021-12-03 DIAGNOSIS — K766 Portal hypertension: Secondary | ICD-10-CM | POA: Diagnosis not present

## 2021-12-03 DIAGNOSIS — C220 Liver cell carcinoma: Secondary | ICD-10-CM | POA: Diagnosis not present

## 2021-12-03 DIAGNOSIS — K769 Liver disease, unspecified: Secondary | ICD-10-CM | POA: Diagnosis not present

## 2021-12-03 DIAGNOSIS — K7460 Unspecified cirrhosis of liver: Secondary | ICD-10-CM | POA: Diagnosis not present

## 2021-12-06 DIAGNOSIS — E1165 Type 2 diabetes mellitus with hyperglycemia: Secondary | ICD-10-CM | POA: Diagnosis not present

## 2021-12-09 ENCOUNTER — Ambulatory Visit: Payer: Medicare PPO | Admitting: Internal Medicine

## 2021-12-09 DIAGNOSIS — E1165 Type 2 diabetes mellitus with hyperglycemia: Secondary | ICD-10-CM | POA: Diagnosis not present

## 2021-12-17 ENCOUNTER — Ambulatory Visit: Payer: Medicare PPO | Admitting: Internal Medicine

## 2021-12-17 ENCOUNTER — Encounter: Payer: Self-pay | Admitting: Internal Medicine

## 2021-12-17 VITALS — BP 140/64 | HR 64 | Temp 97.5°F | Ht 60.0 in | Wt 188.4 lb

## 2021-12-17 DIAGNOSIS — K7581 Nonalcoholic steatohepatitis (NASH): Secondary | ICD-10-CM

## 2021-12-17 DIAGNOSIS — K219 Gastro-esophageal reflux disease without esophagitis: Secondary | ICD-10-CM

## 2021-12-17 DIAGNOSIS — I8511 Secondary esophageal varices with bleeding: Secondary | ICD-10-CM | POA: Diagnosis not present

## 2021-12-17 DIAGNOSIS — K2270 Barrett's esophagus without dysplasia: Secondary | ICD-10-CM | POA: Diagnosis not present

## 2021-12-17 DIAGNOSIS — I8501 Esophageal varices with bleeding: Secondary | ICD-10-CM

## 2021-12-17 NOTE — Progress Notes (Signed)
? ? ?Primary Care Physician:  Glenda Chroman, MD ?Primary Gastroenterologist:  Dr.  Marland Kitchen ?Pre-Procedure History & Physical: ?HPI:  Alisha [name] is a 71 y.o. 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  Patient has had local seed implants.  Reportedly she has had a response to therapy.  History of upper GI bleed secondary esophageal varices banded in the past on a nonselective beta-blocker in the way of Inderal 60 mg daily at this time.  Is been a couple years since she had an EGD.  She needs to be surveilled in the near future.  Reflux well controlled on Protonix 40 mg daily sometimes she does take a second evening dose for breakthrough symptoms.  No dysphagia. ?History of small colonic adenoma removed 2019; surveillance colonoscopy due 2 to 5 years from now ? ?Past Medical History:  ?Diagnosis Date  ? B12 deficiency 11/03/2016  ? B12 deficiency 11/03/2016  ? Barrett's esophagus   ? Cirrhosis of liver without mention of alcohol   ? hep B surface antigen and HCV ab negative.pt has not had hepatitis A and B vaccines.U/S on 07/04/13 shows cirrhosis.  ? Diverticula of colon   ? pancolonic  ? Esophageal reflux   ? Esophageal varices (HCC)   ? Hiatal hernia   ? Iron deficiency anemia, unspecified   ? Other and unspecified hyperlipidemia   ? Portal hypertensive gastropathy (HCC)   ? Tubular adenoma   ? Type II or unspecified type diabetes mellitus without mention of complication, not stated as uncontrolled   ? Unspecified essential hypertension   ? Unspecified hemorrhoids without mention of complication   ? ? ?Past Surgical History:  ?Procedure Laterality Date  ? ABDOMINAL HYSTERECTOMY    ? BIOPSY  04/30/2016  ? Procedure: BIOPSY;  Surgeon: Daneil Dolin, MD;  [facility];  Service: Endoscopy;;  esophagus  ? BIOPSY  02/24/2019  ? Procedure: BIOPSY;  Surgeon: Daneil Dolin, MD;  [facility];  Service: Endoscopy;;  ? CESAREAN SECTION     ? x 2  ? COLONOSCOPY  03/2006  ? left sided diverticula, splenic flexure tublar adenoma  ? COLONOSCOPY  07/2002  ? villous tubular adenoma in rectum  ? COLONOSCOPY  09/23/09  ? external hemorrhoids/scattered pan colonic diverticula otherwise normal. cecal lipoma bx negative, normal TI. Next TCS 09/2014  ? COLONOSCOPY N/A 11/12/2014  ? Procedure: COLONOSCOPY;  Surgeon: Daneil Dolin, MD;  [facility];  Service: Endoscopy;  Laterality: N/A;  1215 - moved to 12:30 - Alisha [name] to notify pt  ? COLONOSCOPY N/A 01/07/2017  ? Alisha [name]: Grade 4 internal hemorrhoids, 5 mm ulcer in the sigmoid colon (benign bx), 7 mm polyp removed from the cecum (tubular adenoma), diverticulosis.next tcs in five years  ? ESOPHAGEAL BANDING N/A 12/20/2016  ? Procedure: ESOPHAGEAL BANDING;  Surgeon: Danie Binder, MD;  [facility];  Service: Endoscopy;  Laterality: N/A;  ? ESOPHAGEAL BANDING N/A 03/18/2017  ? Procedure: ESOPHAGEAL BANDING;  Surgeon: Daneil Dolin, MD;  [facility];  Service: Endoscopy;  Laterality: N/A;  ? ESOPHAGEAL BANDING  12/17/2017  ? Procedure: ESOPHAGEAL BANDING;  Surgeon: Daneil Dolin, MD;  [facility];  Service: Endoscopy;;  ? ESOPHAGEAL BANDING N/A 07/03/2020  ? Procedure: ESOPHAGEAL BANDING;  Surgeon: Daneil Dolin, MD;  [facility];  Service: Endoscopy;  Laterality: N/A;  ? ESOPHAGOGASTRODUODENOSCOPY  08/2006  ? barretts esophagus, no  dyplasia  ? ESOPHAGOGASTRODUODENOSCOPY  03/2006  ? barretts esophagus,bx focal atypia c/w low grade dysplasia, SB bx negative for celiac  ? ESOPHAGOGASTRODUODENOSCOPY  09/23/09  ? 3 columns of grade 2 esophageal varices/hiatal hernia/3-4 cm segment Barrett's esophagus without dysplasia. Next EGD 09/2012  ? ESOPHAGOGASTRODUODENOSCOPY N/A 11/11/2012  ? Dr. Gala Romney- Barrett;s esophagus, esophageal varices, portal gastopathy. hiatal hernia  ? ESOPHAGOGASTRODUODENOSCOPY N/A 04/30/2016  ? Procedure: ESOPHAGOGASTRODUODENOSCOPY (EGD);  Surgeon:  Daneil Dolin, MD;  [facility];  Service: Endoscopy;  Laterality: N/A;  815  ? ESOPHAGOGASTRODUODENOSCOPY N/A 12/20/2016  ? Procedure: ESOPHAGOGASTRODUODENOSCOPY (EGD);  Surgeon: Danie Binder, MD;  [facility];  Service: Endoscopy;  Laterality: N/A;  ? ESOPHAGOGASTRODUODENOSCOPY N/A 01/07/2017  ? Procedure: ESOPHAGOGASTRODUODENOSCOPY (EGD);  Surgeon: Daneil Dolin, MD;  [facility];  Service: Endoscopy;  Laterality: N/A;  ? ESOPHAGOGASTRODUODENOSCOPY N/A 03/18/2017  ? Procedure: ESOPHAGOGASTRODUODENOSCOPY (EGD);  Surgeon: Daneil Dolin, MD;  [facility];  Service: Endoscopy;  Laterality: N/A;  1030 ?  ? ESOPHAGOGASTRODUODENOSCOPY N/A 12/17/2017  ? Procedure: ESOPHAGOGASTRODUODENOSCOPY (EGD);  Surgeon: Daneil Dolin, MD;  [facility];  Service: Endoscopy;  Laterality: N/A;  10:30am  ? ESOPHAGOGASTRODUODENOSCOPY N/A 02/24/2019  ? Alisha [name]: No esophageal varices seen, scars noted from prior banding.  5 cm segment of salmon-colored epithelium coming up from the GE junction consistent with Barrett's (bx with no dysplasia).  Portal gastropathy.  Small hiatal hernia.  Recommended repeat EGD in 18 months.  ? ESOPHAGOGASTRODUODENOSCOPY N/A 07/03/2020  ? Procedure: ESOPHAGOGASTRODUODENOSCOPY (EGD);  Surgeon: Daneil Dolin, MD;  [facility];  Service: Endoscopy;  Laterality: N/A;  10:15am  ? HEMORRHOID SURGERY  2008  ? small bowel capsule endoscopy  10/2009  ? normal  ? ? ?Prior to Admission medications   ?Medication Sig Start Date End Date Taking? Authorizing Provider  ?Accu-Chek Softclix Lancets lancets USE TO CHECK BLOOD SUGAR FOUR TIMES DAILY. Dx: E11.65 05/07/21  Yes [name], MD  ?Cholecalciferol (VITAMIN D) 2000 units tablet Take 2,000 Units by mouth every other day.   Yes [provider]  ?Continuous Blood Gluc Receiver (FREESTYLE LIBRE 2 READER) DEVI As directed 04/29/21  Yes Nida, Marella Chimes, MD  ?Continuous Blood Gluc  Sensor (FREESTYLE LIBRE 2 SENSOR) MISC 1 Piece by Does not apply route every 14 (fourteen) days. 04/29/21  Yes Nida, Marella Chimes, MD  ?insulin aspart (NOVOLOG FLEXPEN) 100 UNIT/ML FlexPen Inject 10-16 Units into the skin 3 (three) times daily before meals. 11/26/21  Yes Nida, Marella Chimes, MD  ?insulin degludec (TRESIBA FLEXTOUCH) 200 UNIT/ML FlexTouch Pen Inject 70 Units into the skin at bedtime. 11/26/21  Yes [name], MD  ?lisinopril (PRINIVIL,ZESTRIL) 10 MG tablet Take 10 mg by mouth daily. 04/09/12  Yes [provider]  ?NOVOFINE 32G X 6 MM MISC USE ONE AS DIRECTED TWICE DAILY. 09/15/18  Yes [provider]  ?pantoprazole (PROTONIX) 40 MG tablet Take 1 tablet (40 mg total) by mouth 2 (two) times daily before a meal. 12/24/16  Yes Orvan Falconer, MD  ?propranolol (INDERAL) 10 MG tablet TAKE THREE (3) TABLETS BY MOUTH EVERY MORNING AND TAKE THREE (3) TABLETS BY MOUTH EVERY EVENING 08/13/21  Yes Annitta Needs, NP  ?Donna Bernard 18 MG/3ML SOLN Inject 1.8 mg into the skin daily. 04/15/12  Yes [provider]  ? ? ?Allergies as of 12/17/2021 - Review Complete 12/17/2021  ?Allergen Reaction Noted  ? Codeine Nausea And Vomiting 04/19/2012  ? ? ?Family  History  ?Problem Relation Age of Onset  ? Alzheimer's disease Mother   ? CAD Father   ? Diabetes Mellitus II Father   ? Alzheimer's disease Father   ? Diabetes Mellitus II Sister   ? Colon cancer Sister 57  ?     small, surgical excision; chemo/rad not needed  ? Cancer - Other Brother   ? Diabetes Mellitus II Brother   ? Hypertension Brother   ? Liver disease Neg Hx   ? ? ?Social History  ? ?Socioeconomic History  ? Marital status: Married  ?  Spouse name: Not on file  ? Number of children: Not on file  ? Years of education: Not on file  ? Highest education level: Not on file  ?Occupational History  ? Not on file  ?Tobacco Use  ? Smoking status: Never  ? Smokeless tobacco: Never  ?Vaping Use  ? Vaping Use: Never used  ?Substance and Sexual  Activity  ? Alcohol use: No  ? Drug use: No  ? Sexual activity: Yes  ?Other Topics Concern  ? Not on file  ?Social History Narrative  ? Not on file  ? ?Social Determinants of Health  ? ?Armed forces technical officer

## 2021-12-17 NOTE — Patient Instructions (Signed)
It was good seeing you again today! ? ?No change in her medication regimen today; continue Inderal and Protonix 40 mg daily to twice daily. ? ?As discussed, you should have a repeat EGD to check for recurrence of esophageal varices.  ASA 4 ? ?Office visit with [name] in 4 months ?

## 2021-12-19 ENCOUNTER — Other Ambulatory Visit: Payer: Self-pay | Admitting: "Endocrinology

## 2021-12-23 ENCOUNTER — Other Ambulatory Visit (HOSPITAL_COMMUNITY)
Admission: RE | Admit: 2021-12-23 | Discharge: 2021-12-23 | Disposition: A | Discharge status: Home / Self Care | Payer: Medicare PPO | Source: Ambulatory Visit | Attending: Nephrology | Admitting: Nephrology

## 2021-12-23 DIAGNOSIS — N189 Chronic kidney disease, unspecified: Secondary | ICD-10-CM | POA: Diagnosis not present

## 2021-12-23 LAB — RENAL FUNCTION PANEL
Albumin: 3.4 g/dL — ABNORMAL LOW (ref 3.5–5.0)
Anion gap: 5 (ref 5–15)
BUN: 14 mg/dL (ref 8–23)
CO2: 23 mmol/L (ref 22–32)
Calcium: 9.1 mg/dL (ref 8.9–10.3)
Chloride: 107 mmol/L (ref 98–111)
Creatinine, Ser: 1.66 mg/dL — ABNORMAL HIGH (ref 0.44–1.00)
GFR, Estimated: 33 mL/min — ABNORMAL LOW (ref >60)
Glucose, Bld: 240 mg/dL — ABNORMAL HIGH (ref 70–99)
Phosphorus: 2.8 mg/dL (ref 2.5–4.6)
Potassium: 4 mmol/L (ref 3.5–5.1)
Sodium: 135 mmol/L (ref 135–145)

## 2021-12-23 LAB — CBC
HCT: 33.7 % — ABNORMAL LOW (ref 36.0–46.0)
Hemoglobin: 11.7 g/dL — ABNORMAL LOW (ref 12.0–15.0)
MCH: 31 pg (ref 26.0–34.0)
MCHC: 34.7 g/dL (ref 30.0–36.0)
MCV: 89.2 fL (ref 80.0–100.0)
RBC: 3.78 MIL/uL — ABNORMAL LOW (ref 3.87–5.11)
RDW: 14.9 % (ref 11.5–15.5)
WBC: 4.8 10*3/uL (ref 4.0–10.5)
nRBC: 0 % (ref 0.0–0.2)

## 2021-12-25 DIAGNOSIS — E1122 Type 2 diabetes mellitus with diabetic chronic kidney disease: Secondary | ICD-10-CM | POA: Diagnosis not present

## 2021-12-25 DIAGNOSIS — I129 Hypertensive chronic kidney disease with stage 1 through stage 4 chronic kidney disease, or unspecified chronic kidney disease: Secondary | ICD-10-CM | POA: Diagnosis not present

## 2021-12-25 DIAGNOSIS — D631 Anemia in chronic kidney disease: Secondary | ICD-10-CM | POA: Diagnosis not present

## 2021-12-25 DIAGNOSIS — N189 Chronic kidney disease, unspecified: Secondary | ICD-10-CM | POA: Diagnosis not present

## 2021-12-25 DIAGNOSIS — D696 Thrombocytopenia, unspecified: Secondary | ICD-10-CM | POA: Diagnosis not present

## 2021-12-25 DIAGNOSIS — C220 Liver cell carcinoma: Secondary | ICD-10-CM | POA: Diagnosis not present

## 2021-12-25 LAB — MICROALBUMIN / CREATININE URINE RATIO
Creatinine, Urine: 420.8 mg/dL
Microalb Creat Ratio: 6 mg/g creat (ref 0–29)
Microalb, Ur: 25.8 ug/mL — ABNORMAL HIGH

## 2021-12-30 ENCOUNTER — Encounter: Payer: Self-pay | Admitting: *Deleted

## 2021-12-31 ENCOUNTER — Ambulatory Visit: Payer: Medicare PPO | Admitting: "Endocrinology

## 2022-01-06 DIAGNOSIS — E1165 Type 2 diabetes mellitus with hyperglycemia: Secondary | ICD-10-CM | POA: Diagnosis not present

## 2022-01-07 ENCOUNTER — Ambulatory Visit: Payer: Medicare PPO | Admitting: "Endocrinology

## 2022-01-20 ENCOUNTER — Ambulatory Visit: Payer: Medicare PPO | Admitting: "Endocrinology

## 2022-01-20 ENCOUNTER — Encounter: Payer: Self-pay | Admitting: "Endocrinology

## 2022-01-20 VITALS — BP 122/66 | HR 72 | Ht 60.0 in | Wt 188.8 lb

## 2022-01-20 DIAGNOSIS — Z794 Long term (current) use of insulin: Secondary | ICD-10-CM

## 2022-01-20 DIAGNOSIS — Z91199 Patient's noncompliance with other medical treatment and regimen due to unspecified reason: Secondary | ICD-10-CM | POA: Insufficient documentation

## 2022-01-20 DIAGNOSIS — E559 Vitamin D deficiency, unspecified: Secondary | ICD-10-CM

## 2022-01-20 DIAGNOSIS — E1169 Type 2 diabetes mellitus with other specified complication: Secondary | ICD-10-CM | POA: Diagnosis not present

## 2022-01-20 DIAGNOSIS — I10 Essential (primary) hypertension: Secondary | ICD-10-CM

## 2022-01-20 MED ORDER — TRESIBA FLEXTOUCH 200 UNIT/ML ~~LOC~~ SOPN: 80.0000 [IU] | PEN_INJECTOR | Freq: Every day | SUBCUTANEOUS | 2 refills | Status: DC

## 2022-01-20 NOTE — Progress Notes (Signed)
? ?                                                    ?     01/20/2022, 1:20 PM ? ?Endocrinology follow-up note ? ? ?Subjective:  ? ? Patient ID: Alisha [name], female    DOB: 06/30/1951.  ?Alisha [name] is being seen in follow-up after she was seen in consultation for management of currently uncontrolled symptomatic diabetes requested by  Glenda Chroman, MD. ? ? ?Past Medical History:  ?Diagnosis Date  ? B12 deficiency 11/03/2016  ? B12 deficiency 11/03/2016  ? Barrett's esophagus   ? Cirrhosis of liver without mention of alcohol   ? hep B surface antigen and HCV ab negative.pt has not had hepatitis A and B vaccines.U/S on 07/04/13 shows cirrhosis.  ? Diverticula of colon   ? pancolonic  ? Esophageal reflux   ? Esophageal varices (HCC)   ? Hiatal hernia   ? Iron deficiency anemia, unspecified   ? Other and unspecified hyperlipidemia   ? Portal hypertensive gastropathy (HCC)   ? Tubular adenoma   ? Type II or unspecified type diabetes mellitus without mention of complication, not stated as uncontrolled   ? Unspecified essential hypertension   ? Unspecified hemorrhoids without mention of complication   ? ? ?Past Surgical History:  ?Procedure Laterality Date  ? ABDOMINAL HYSTERECTOMY    ? BIOPSY  04/30/2016  ? Procedure: BIOPSY;  Surgeon: Daneil Dolin, MD;  [facility];  Service: Endoscopy;;  esophagus  ? BIOPSY  02/24/2019  ? Procedure: BIOPSY;  Surgeon: Daneil Dolin, MD;  [facility];  Service: Endoscopy;;  ? CESAREAN SECTION    ? x 2  ? COLONOSCOPY  03/2006  ? left sided diverticula, splenic flexure tublar adenoma  ? COLONOSCOPY  07/2002  ? villous tubular adenoma in rectum  ? COLONOSCOPY  09/23/09  ? external hemorrhoids/scattered pan colonic diverticula otherwise normal. cecal lipoma bx negative, normal TI. Next TCS 09/2014  ? COLONOSCOPY N/A 11/12/2014  ? Procedure: COLONOSCOPY;  Surgeon: Daneil Dolin, MD;  [facility]: [facility];  Service: Endoscopy;   Laterality: N/A;  1215 - moved to 12:30 - Ginger to notify pt  ? COLONOSCOPY N/A 01/07/2017  ? Rourk: Grade 4 internal hemorrhoids, 5 mm ulcer in the sigmoid colon (benign bx), 7 mm polyp removed from the cecum (tubular adenoma), diverticulosis.next tcs in five years  ? ESOPHAGEAL BANDING N/A 12/20/2016  ? Procedure: ESOPHAGEAL BANDING;  Surgeon: Danie Binder, MD;  [facility];  Service: Endoscopy;  Laterality: N/A;  ? ESOPHAGEAL BANDING N/A 03/18/2017  ? Procedure: ESOPHAGEAL BANDING;  Surgeon: Daneil Dolin, MD;  [facility];  Service: Endoscopy;  Laterality: N/A;  ? ESOPHAGEAL BANDING  12/17/2017  ? Procedure: ESOPHAGEAL BANDING;  Surgeon: Daneil Dolin, MD;  [facility];  Service: Endoscopy;;  ? ESOPHAGEAL BANDING N/A 07/03/2020  ? Procedure: ESOPHAGEAL BANDING;  Surgeon: Daneil Dolin, MD;  [facility];  Service: Endoscopy;  Laterality: N/A;  ? ESOPHAGOGASTRODUODENOSCOPY  08/2006  ? barretts esophagus, no dyplasia  ? ESOPHAGOGASTRODUODENOSCOPY  03/2006  ? barretts esophagus,bx focal atypia c/w low grade dysplasia, SB bx negative for celiac  ? ESOPHAGOGASTRODUODENOSCOPY  09/23/09  ? 3 columns of grade 2 esophageal varices/hiatal  hernia/3-4 cm segment Barrett's esophagus without dysplasia. Next EGD 09/2012  ? ESOPHAGOGASTRODUODENOSCOPY N/A 11/11/2012  ? Dr. Gala Romney- Barrett;s esophagus, esophageal varices, portal gastopathy. hiatal hernia  ? ESOPHAGOGASTRODUODENOSCOPY N/A 04/30/2016  ? Procedure: ESOPHAGOGASTRODUODENOSCOPY (EGD);  Surgeon: Daneil Dolin, MD;  [facility];  Service: Endoscopy;  Laterality: N/A;  815  ? ESOPHAGOGASTRODUODENOSCOPY N/A 12/20/2016  ? Procedure: ESOPHAGOGASTRODUODENOSCOPY (EGD);  Surgeon: Danie Binder, MD;  [facility];  Service: Endoscopy;  Laterality: N/A;  ? ESOPHAGOGASTRODUODENOSCOPY N/A 01/07/2017  ? Procedure: ESOPHAGOGASTRODUODENOSCOPY (EGD);  Surgeon: Daneil Dolin, MD;  [facility];  Service:  Endoscopy;  Laterality: N/A;  ? ESOPHAGOGASTRODUODENOSCOPY N/A 03/18/2017  ? Procedure: ESOPHAGOGASTRODUODENOSCOPY (EGD);  Surgeon: Daneil Dolin, MD;  [facility];  Service: Endoscopy;  Laterality: N/A;  1030 ?  ? ESOPHAGOGASTRODUODENOSCOPY N/A 12/17/2017  ? Procedure: ESOPHAGOGASTRODUODENOSCOPY (EGD);  Surgeon: Daneil Dolin, MD;  [facility];  Service: Endoscopy;  Laterality: N/A;  10:30am  ? ESOPHAGOGASTRODUODENOSCOPY N/A 02/24/2019  ? Rourk: No esophageal varices seen, scars noted from prior banding.  5 cm segment of salmon-colored epithelium coming up from the GE junction consistent with Barrett's (bx with no dysplasia).  Portal gastropathy.  Small hiatal hernia.  Recommended repeat EGD in 18 months.  ? ESOPHAGOGASTRODUODENOSCOPY N/A 07/03/2020  ? Procedure: ESOPHAGOGASTRODUODENOSCOPY (EGD);  Surgeon: Daneil Dolin, MD;  [facility];  Service: Endoscopy;  Laterality: N/A;  10:15am  ? HEMORRHOID SURGERY  2008  ? small bowel capsule endoscopy  10/2009  ? normal  ? ? ?Social History  ? ?Socioeconomic History  ? Marital status: Married  ?  Spouse name: Not on file  ? Number of children: Not on file  ? Years of education: Not on file  ? Highest education level: Not on file  ?Occupational History  ? Not on file  ?Tobacco Use  ? Smoking status: Never  ? Smokeless tobacco: Never  ?Vaping Use  ? Vaping Use: Never used  ?Substance and Sexual Activity  ? Alcohol use: No  ? Drug use: No  ? Sexual activity: Yes  ?Other Topics Concern  ? Not on file  ?Social History Narrative  ? Not on file  ? ?Social Determinants of Health  ? ?Financial Resource Strain: Not on file  ?Food Insecurity: Not on file  ?Transportation Needs: Not on file  ?Physical Activity: Not on file  ?Stress: Not on file  ?Social Connections: Not on file  ? ? ?Family History  ?Problem Relation Age of Onset  ? Alzheimer's disease Mother   ? CAD Father   ? Diabetes Mellitus II Father   ? Alzheimer's disease Father   ?  Diabetes Mellitus II Sister   ? Colon cancer Sister 34  ?     small, surgical excision; chemo/rad not needed  ? Cancer - Other Brother   ? Diabetes Mellitus II Brother   ? Hypertension Brother   ? Liver disease Neg Hx   ? ? ?Outpatient Encounter Medications as of 01/20/2022  ?Medication Sig  ? Accu-Chek Softclix Lancets lancets USE TO CHECK BLOOD SUGAR FOUR TIMES DAILY. Dx: E11.65  ? Cholecalciferol (VITAMIN D) 2000 units tablet Take 2,000 Units by mouth every other day.  ? Continuous Blood Gluc Receiver (FREESTYLE LIBRE 2 READER) DEVI As directed  ? Continuous Blood Gluc Sensor (FREESTYLE LIBRE 2 SENSOR) MISC 1 Piece by Does not apply route every 14 (fourteen) days.  ? insulin aspart (NOVOLOG FLEXPEN) 100 UNIT/ML FlexPen Inject  10-16 Units into the skin 3 (three) times daily before meals.  ? insulin degludec (TRESIBA FLEXTOUCH) 200 UNIT/ML FlexTouch Pen Inject 80 Units into the skin at bedtime.  ? lisinopril (PRINIVIL,ZESTRIL) 10 MG tablet Take 10 mg by mouth daily.  ? NOVOFINE 32G X 6 MM MISC USE ONE AS DIRECTED TWICE DAILY.  ? pantoprazole (PROTONIX) 40 MG tablet Take 1 tablet (40 mg total) by mouth 2 (two) times daily before a meal.  ? propranolol (INDERAL) 10 MG tablet TAKE THREE (3) TABLETS BY MOUTH EVERY MORNING AND TAKE THREE (3) TABLETS BY MOUTH EVERY EVENING  ? VICTOZA 18 MG/3ML SOLN Inject 1.8 mg into the skin daily.  ? [DISCONTINUED] insulin degludec (TRESIBA FLEXTOUCH) 200 UNIT/ML FlexTouch Pen Inject 70 Units into the skin at bedtime.  ? ?No facility-administered encounter medications on file as of 01/20/2022.  ? ? ?ALLERGIES: ?Allergies  ?Allergen Reactions  ? Codeine Nausea And Vomiting  ? ? ?VACCINATION STATUS: ? ?There is no immunization history on file for this patient. ? ?Diabetes ?She presents for her follow-up diabetic visit. She has type 2 diabetes mellitus. Onset time: She was diagnosed at approximate age of 3 years. Her disease course has been worsening. There are no hypoglycemic associated  symptoms. Pertinent negatives for hypoglycemia include no confusion, headaches, pallor or seizures. Pertinent negatives for diabetes include no chest pain, no fatigue, no polydipsia, no polyphagia and no polyuria. (She did

## 2022-01-20 NOTE — Patient Instructions (Signed)

## 2022-02-04 ENCOUNTER — Telehealth: Payer: Self-pay | Admitting: *Deleted

## 2022-02-04 NOTE — Telephone Encounter (Signed)
LMOVM to call back to schedule EGD with Dr. Gala Romney, ASA 4

## 2022-02-05 DIAGNOSIS — E1165 Type 2 diabetes mellitus with hyperglycemia: Secondary | ICD-10-CM | POA: Diagnosis not present

## 2022-02-05 NOTE — Telephone Encounter (Signed)
Spoke with pt. She received a recall letter for TCS. She thought she was only due for EGD to f/u varices. Please advise Dr. Gala Romney as too what we should schedule patient for? thanks

## 2022-02-05 NOTE — Telephone Encounter (Signed)
LMOVM to call back to schedule TCS/EGD with Dr. Gala Romney, ASA 4

## 2022-02-06 ENCOUNTER — Encounter: Payer: Self-pay | Admitting: *Deleted

## 2022-02-06 MED ORDER — PEG 3350-KCL-NA BICARB-NACL 420 G PO SOLR: ORAL | 0 refills | Status: DC

## 2022-02-06 NOTE — Telephone Encounter (Signed)
PA approved via [employer]  TCS auth# [id number], DOS: 04/01/2022 - 06/30/2022  EGD auth# [id number], DOS: 04/01/2022 - 06/30/2022

## 2022-02-06 NOTE — Telephone Encounter (Signed)
Spoke with pt. She has been scheduled for 7/19 at [facility] will mail instructions with pre-op appt. Will send rx prep.

## 2022-02-06 NOTE — Telephone Encounter (Signed)
Rourk, Cristopher Estimable, MD  Cheron Every, CMA Decrease Tyler Aas to 50 units at night the 2 nights before procedures;everything else should be OK

## 2022-02-10 ENCOUNTER — Other Ambulatory Visit: Payer: Self-pay | Admitting: Gastroenterology

## 2022-02-23 DIAGNOSIS — Z8616 Personal history of COVID-19: Secondary | ICD-10-CM | POA: Diagnosis not present

## 2022-02-23 DIAGNOSIS — K7460 Unspecified cirrhosis of liver: Secondary | ICD-10-CM | POA: Diagnosis not present

## 2022-02-23 DIAGNOSIS — R918 Other nonspecific abnormal finding of lung field: Secondary | ICD-10-CM | POA: Diagnosis not present

## 2022-02-23 DIAGNOSIS — K766 Portal hypertension: Secondary | ICD-10-CM | POA: Diagnosis not present

## 2022-02-24 DIAGNOSIS — K7469 Other cirrhosis of liver: Secondary | ICD-10-CM | POA: Diagnosis not present

## 2022-02-24 DIAGNOSIS — C220 Liver cell carcinoma: Secondary | ICD-10-CM | POA: Diagnosis not present

## 2022-02-24 DIAGNOSIS — K7581 Nonalcoholic steatohepatitis (NASH): Secondary | ICD-10-CM | POA: Diagnosis not present

## 2022-03-08 DIAGNOSIS — E1165 Type 2 diabetes mellitus with hyperglycemia: Secondary | ICD-10-CM | POA: Diagnosis not present

## 2022-03-24 ENCOUNTER — Other Ambulatory Visit (HOSPITAL_COMMUNITY): Payer: Self-pay

## 2022-03-24 ENCOUNTER — Encounter: Payer: Self-pay | Admitting: "Endocrinology

## 2022-03-24 ENCOUNTER — Inpatient Hospital Stay (HOSPITAL_COMMUNITY): Payer: Medicare PPO | Attending: Hematology

## 2022-03-24 ENCOUNTER — Ambulatory Visit (INDEPENDENT_AMBULATORY_CARE_PROVIDER_SITE_OTHER): Payer: Medicare PPO | Admitting: "Endocrinology

## 2022-03-24 VITALS — BP 122/54 | HR 68 | Ht 60.0 in | Wt 191.8 lb

## 2022-03-24 DIAGNOSIS — E1169 Type 2 diabetes mellitus with other specified complication: Secondary | ICD-10-CM

## 2022-03-24 DIAGNOSIS — K219 Gastro-esophageal reflux disease without esophagitis: Secondary | ICD-10-CM | POA: Insufficient documentation

## 2022-03-24 DIAGNOSIS — Z6835 Body mass index (BMI) 35.0-35.9, adult: Secondary | ICD-10-CM | POA: Diagnosis not present

## 2022-03-24 DIAGNOSIS — K766 Portal hypertension: Secondary | ICD-10-CM | POA: Diagnosis not present

## 2022-03-24 DIAGNOSIS — I129 Hypertensive chronic kidney disease with stage 1 through stage 4 chronic kidney disease, or unspecified chronic kidney disease: Secondary | ICD-10-CM | POA: Insufficient documentation

## 2022-03-24 DIAGNOSIS — Z794 Long term (current) use of insulin: Secondary | ICD-10-CM | POA: Diagnosis not present

## 2022-03-24 DIAGNOSIS — Z800 Family history of malignant neoplasm of digestive organs: Secondary | ICD-10-CM | POA: Diagnosis not present

## 2022-03-24 DIAGNOSIS — I10 Essential (primary) hypertension: Secondary | ICD-10-CM

## 2022-03-24 DIAGNOSIS — D696 Thrombocytopenia, unspecified: Secondary | ICD-10-CM

## 2022-03-24 DIAGNOSIS — E785 Hyperlipidemia, unspecified: Secondary | ICD-10-CM | POA: Insufficient documentation

## 2022-03-24 DIAGNOSIS — D509 Iron deficiency anemia, unspecified: Secondary | ICD-10-CM | POA: Insufficient documentation

## 2022-03-24 DIAGNOSIS — K7460 Unspecified cirrhosis of liver: Secondary | ICD-10-CM | POA: Insufficient documentation

## 2022-03-24 DIAGNOSIS — K2270 Barrett's esophagus without dysplasia: Secondary | ICD-10-CM | POA: Insufficient documentation

## 2022-03-24 DIAGNOSIS — E538 Deficiency of other specified B group vitamins: Secondary | ICD-10-CM | POA: Insufficient documentation

## 2022-03-24 DIAGNOSIS — N184 Chronic kidney disease, stage 4 (severe): Secondary | ICD-10-CM | POA: Insufficient documentation

## 2022-03-24 DIAGNOSIS — E119 Type 2 diabetes mellitus without complications: Secondary | ICD-10-CM | POA: Diagnosis not present

## 2022-03-24 DIAGNOSIS — Z7985 Long-term (current) use of injectable non-insulin antidiabetic drugs: Secondary | ICD-10-CM | POA: Insufficient documentation

## 2022-03-24 DIAGNOSIS — R918 Other nonspecific abnormal finding of lung field: Secondary | ICD-10-CM | POA: Insufficient documentation

## 2022-03-24 DIAGNOSIS — Z79899 Other long term (current) drug therapy: Secondary | ICD-10-CM | POA: Insufficient documentation

## 2022-03-24 DIAGNOSIS — D500 Iron deficiency anemia secondary to blood loss (chronic): Secondary | ICD-10-CM

## 2022-03-24 DIAGNOSIS — Z91199 Patient's noncompliance with other medical treatment and regimen due to unspecified reason: Secondary | ICD-10-CM

## 2022-03-24 DIAGNOSIS — E559 Vitamin D deficiency, unspecified: Secondary | ICD-10-CM

## 2022-03-24 DIAGNOSIS — E678 Other specified hyperalimentation: Secondary | ICD-10-CM

## 2022-03-24 LAB — COMPREHENSIVE METABOLIC PANEL
ALT: 19 U/L (ref 0–44)
AST: 33 U/L (ref 15–41)
Albumin: 3.3 g/dL — ABNORMAL LOW (ref 3.5–5.0)
Alkaline Phosphatase: 101 U/L (ref 38–126)
Anion gap: 1 — ABNORMAL LOW (ref 5–15)
BUN: 14 mg/dL (ref 8–23)
CO2: 26 mmol/L (ref 22–32)
Calcium: 8.8 mg/dL — ABNORMAL LOW (ref 8.9–10.3)
Chloride: 112 mmol/L — ABNORMAL HIGH (ref 98–111)
Creatinine, Ser: 1.62 mg/dL — ABNORMAL HIGH (ref 0.44–1.00)
GFR, Estimated: 34 mL/min — ABNORMAL LOW (ref >60)
Glucose, Bld: 98 mg/dL (ref 70–99)
Potassium: 4 mmol/L (ref 3.5–5.1)
Sodium: 139 mmol/L (ref 135–145)
Total Bilirubin: 2.9 mg/dL — ABNORMAL HIGH (ref 0.3–1.2)
Total Protein: 6.6 g/dL (ref 6.5–8.1)

## 2022-03-24 LAB — CBC WITH DIFFERENTIAL/PLATELET
Abs Immature Granulocytes: 0.01 10*3/uL (ref 0.00–0.07)
Basophils Absolute: 0 10*3/uL (ref 0.0–0.1)
Basophils Relative: 1 %
Eosinophils Absolute: 0.1 10*3/uL (ref 0.0–0.5)
Eosinophils Relative: 3 %
HCT: 34.8 % — ABNORMAL LOW (ref 36.0–46.0)
Hemoglobin: 11.9 g/dL — ABNORMAL LOW (ref 12.0–15.0)
Immature Granulocytes: 0 %
Lymphocytes Relative: 18 %
Lymphs Abs: 0.8 10*3/uL (ref 0.7–4.0)
MCH: 31.2 pg (ref 26.0–34.0)
MCHC: 34.2 g/dL (ref 30.0–36.0)
MCV: 91.3 fL (ref 80.0–100.0)
Monocytes Absolute: 0.3 10*3/uL (ref 0.1–1.0)
Monocytes Relative: 8 %
Neutro Abs: 3 10*3/uL (ref 1.7–7.7)
Neutrophils Relative %: 70 %
Platelets: 58 10*3/uL — ABNORMAL LOW (ref 150–400)
RBC: 3.81 MIL/uL — ABNORMAL LOW (ref 3.87–5.11)
RDW: 14.9 % (ref 11.5–15.5)
WBC: 4.3 10*3/uL (ref 4.0–10.5)
nRBC: 0 % (ref 0.0–0.2)

## 2022-03-24 LAB — FERRITIN: Ferritin: 207 ng/mL (ref 11–307)

## 2022-03-24 LAB — POCT GLYCOSYLATED HEMOGLOBIN (HGB A1C): HbA1c, POC (controlled diabetic range): 9 % — AB (ref 0.0–7.0)

## 2022-03-24 LAB — POCT UA - MICROALBUMIN
Albumin/Creatinine Ratio, Urine, POC: <30
Creatinine, POC: 300 mg/dL
Microalbumin Ur, POC: 30 mg/L

## 2022-03-24 LAB — IRON AND TIBC
Iron: 119 ug/dL (ref 28–170)
Saturation Ratios: 48 % — ABNORMAL HIGH (ref 10.4–31.8)
TIBC: 250 ug/dL (ref 250–450)
UIBC: 131 ug/dL

## 2022-03-24 LAB — VITAMIN B12: Vitamin B-12: 603 pg/mL (ref 180–914)

## 2022-03-24 MED ORDER — TRESIBA FLEXTOUCH 200 UNIT/ML ~~LOC~~ SOPN: 90.0000 [IU] | PEN_INJECTOR | Freq: Every day | SUBCUTANEOUS | 2 refills | Status: DC

## 2022-03-24 MED ORDER — NOVOLOG FLEXPEN 100 UNIT/ML ~~LOC~~ SOPN: 12.0000 [IU] | PEN_INJECTOR | Freq: Three times a day (TID) | SUBCUTANEOUS | 2 refills | Status: DC

## 2022-03-24 NOTE — Patient Instructions (Signed)

## 2022-03-24 NOTE — Patient Instructions (Signed)
Alisha [name]  03/24/2022     @PREFPERIOPPHARMACY @   Your procedure is scheduled on  04/01/2022.   Report to [facility] at  [phone]  A.M.   Call this number if you have problems the morning of surgery:  [phone]   Remember:  Follow the diet and prep instructions given to you by the office.    Take 45 units of your tresiba the night before your procedure.     DO NOT take any medications for diabetes the morning of your procedure.     Bring extra [place] supplies with you.    Take these medicines the morning of surgery with A SIP OF WATER                               protonix, inderal.     Do not wear jewelry, make-up or nail polish.  Do not wear lotions, powders, or perfumes, or deodorant.  Do not shave 48 hours prior to surgery.  Men may shave face and neck.  Do not bring valuables to the hospital.  [facility] is not responsible for any belongings or valuables.  Contacts, dentures or bridgework may not be worn into surgery.  Leave your suitcase in the car.  After surgery it may be brought to your room.  For patients admitted to the hospital, discharge time will be determined by your treatment team.  Patients discharged the day of surgery will not be allowed to drive home and must have someone with them for 24 hours.    Special instructions:   DO NOT smoke tobacco or vape for 24 hours before your procedure.  Please read over the following fact sheets that you were given. Anesthesia Post-op Instructions and Care and Recovery After Surgery      Upper Endoscopy, Adult, Care After This sheet gives you information about how to care for yourself after your procedure. Your health care provider may also give you more specific instructions. If you have problems or questions, contact your health care provider. What can I expect after the procedure? After the procedure, it is common to have: A sore throat. Mild stomach pain or  discomfort. Bloating. Nausea. Follow these instructions at home:  Follow instructions from your health care provider about what to eat or drink after your procedure. Return to your normal activities as told by your health care provider. Ask your health care provider what activities are safe for you. Take over-the-counter and prescription medicines only as told by your health care provider. If you were given a sedative during the procedure, it can affect you for several hours. Do not drive or operate machinery until your health care provider says that it is safe. Keep all follow-up visits as told by your health care provider. This is important. Contact a health care provider if you have: A sore throat that lasts longer than one day. Trouble swallowing. Get help right away if: You vomit blood or your vomit looks like coffee grounds. You have: A fever. Bloody, black, or tarry stools. A severe sore throat or you cannot swallow. Difficulty breathing. Severe pain in your chest or abdomen. Summary After the procedure, it is common to have a sore throat, mild stomach discomfort, bloating, and nausea. If you were given a sedative during the procedure, it can affect you for several hours. Do not drive or operate machinery until your health care provider  says that it is safe. Follow instructions from your health care provider about what to eat or drink after your procedure. Return to your normal activities as told by your health care provider. This information is not intended to replace advice given to you by your health care provider. Make sure you discuss any questions you have with your health care provider. Document Revised: 07/07/2019 Document Reviewed: 01/31/2018 Elsevier Patient Education  [place]. Colonoscopy, Adult, Care After The following information offers guidance on how to care for yourself after your procedure. Your health care provider may also give you more specific  instructions. If you have problems or questions, contact your health care provider. What can I expect after the procedure? After the procedure, it is common to have: A small amount of blood in your stool for 24 hours after the procedure. Some gas. Mild cramping or bloating of your abdomen. Follow these instructions at home: Eating and drinking  Drink enough fluid to keep your urine pale yellow. Follow instructions from your health care provider about eating or drinking restrictions. Resume your normal diet as told by your health care provider. Avoid heavy or fried foods that are hard to digest. Activity Rest as told by your health care provider. Avoid sitting for a long time without moving. Get up to take short walks every 1-2 hours. This is important to improve blood flow and breathing. Ask for help if you feel weak or unsteady. Return to your normal activities as told by your health care provider. Ask your health care provider what activities are safe for you. Managing cramping and bloating  Try walking around when you have cramps or feel bloated. If directed, apply heat to your abdomen as told by your health care provider. Use the heat source that your health care provider recommends, such as a moist heat pack or a heating pad. Place a towel between your skin and the heat source. Leave the heat on for 20-30 minutes. Remove the heat if your skin turns bright red. This is especially important if you are unable to feel pain, heat, or cold. You have a greater risk of getting burned. General instructions If you were given a sedative during the procedure, it can affect you for several hours. Do not drive or operate machinery until your health care provider says that it is safe. For the first 24 hours after the procedure: Do not sign important documents. Do not drink alcohol. Do your regular daily activities at a slower pace than normal. Eat soft foods that are easy to digest. Take  over-the-counter and prescription medicines only as told by your health care provider. Keep all follow-up visits. This is important. Contact a health care provider if: You have blood in your stool 2-3 days after the procedure. Get help right away if: You have more than a small spotting of blood in your stool. You have large blood clots in your stool. You have swelling of your abdomen. You have nausea or vomiting. You have a fever. You have increasing pain in your abdomen that is not relieved with medicine. These symptoms may be an emergency. Get help right away. Call 911. Do not wait to see if the symptoms will go away. Do not drive yourself to the hospital. Summary After the procedure, it is common to have a small amount of blood in your stool. You may also have mild cramping and bloating of your abdomen. If you were given a sedative during the procedure, it can affect  you for several hours. Do not drive or operate machinery until your health care provider says that it is safe. Get help right away if you have a lot of blood in your stool, nausea or vomiting, a fever, or increased pain in your abdomen. This information is not intended to replace advice given to you by your health care provider. Make sure you discuss any questions you have with your health care provider. Document Revised: 04/23/2021 Document Reviewed: 04/23/2021 Elsevier Patient Education  [place] After This sheet gives you information about how to care for yourself after your procedure. Your health care provider may also give you more specific instructions. If you have problems or questions, contact your health care provider. What can I expect after the procedure? After the procedure, it is common to have: Tiredness. Forgetfulness about what happened after the procedure. Impaired judgment for important decisions. Nausea or vomiting. Some difficulty with balance. Follow these  instructions at home: For the time period you were told by your health care provider:     Rest as needed. Do not participate in activities where you could fall or become injured. Do not drive or use machinery. Do not drink alcohol. Do not take sleeping pills or medicines that cause drowsiness. Do not make important decisions or sign legal documents. Do not take care of children on your own. Eating and drinking Follow the diet that is recommended by your health care provider. Drink enough fluid to keep your urine pale yellow. If you vomit: Drink water, juice, or soup when you can drink without vomiting. Make sure you have little or no nausea before eating solid foods. General instructions Have a responsible adult stay with you for the time you are told. It is important to have someone help care for you until you are awake and alert. Take over-the-counter and prescription medicines only as told by your health care provider. If you have sleep apnea, surgery and certain medicines can increase your risk for breathing problems. Follow instructions from your health care provider about wearing your sleep device: Anytime you are sleeping, including during daytime naps. While taking prescription pain medicines, sleeping medicines, or medicines that make you drowsy. Avoid smoking. Keep all follow-up visits as told by your health care provider. This is important. Contact a health care provider if: You keep feeling nauseous or you keep vomiting. You feel light-headed. You are still sleepy or having trouble with balance after 24 hours. You develop a rash. You have a fever. You have redness or swelling around the IV site. Get help right away if: You have trouble breathing. You have new-onset confusion at home. Summary For several hours after your procedure, you may feel tired. You may also be forgetful and have poor judgment. Have a responsible adult stay with you for the time you are told. It  is important to have someone help care for you until you are awake and alert. Rest as told. Do not drive or operate machinery. Do not drink alcohol or take sleeping pills. Get help right away if you have trouble breathing, or if you suddenly become confused. This information is not intended to replace advice given to you by your health care provider. Make sure you discuss any questions you have with your health care provider. Document Revised: 08/05/2021 Document Reviewed: 08/03/2019 Elsevier Patient Education  [place].

## 2022-03-24 NOTE — Progress Notes (Signed)
03/24/2022, 3:55 PM  Endocrinology follow-up note   Subjective:    Patient ID: Alisha [name], female    DOB: [birth year].  Alisha [name] is being seen in follow-up after she was seen in consultation for management of currently uncontrolled symptomatic diabetes requested by  Glenda Chroman, MD.   Past Medical History:  Diagnosis Date   B12 deficiency 11/03/2016   B12 deficiency 11/03/2016   Barrett's esophagus    Cirrhosis of liver without mention of alcohol    hep B surface antigen and HCV ab negative.pt has not had hepatitis A and B vaccines.U/S on 07/04/13 shows cirrhosis.   Diverticula of colon    pancolonic   Esophageal reflux    Esophageal varices (HCC)    Hiatal hernia    Iron deficiency anemia, unspecified    Other and unspecified hyperlipidemia    Portal hypertensive gastropathy (HCC)    Tubular adenoma    Type II or unspecified type diabetes mellitus without mention of complication, not stated as uncontrolled    Unspecified essential hypertension    Unspecified hemorrhoids without mention of complication     Past Surgical History:  Procedure Laterality Date   ABDOMINAL HYSTERECTOMY     BIOPSY  04/30/2016   Procedure: BIOPSY;  Surgeon: Daneil Dolin, MD;  [facility];  Service: Endoscopy;;  esophagus   BIOPSY  02/24/2019   Procedure: BIOPSY;  Surgeon: Daneil Dolin, MD;  [facility];  Service: Endoscopy;;   CESAREAN SECTION     x 2   COLONOSCOPY  03/2006   left sided diverticula, splenic flexure tublar adenoma   COLONOSCOPY  07/2002   villous tubular adenoma in rectum   COLONOSCOPY  09/23/09   external hemorrhoids/scattered pan colonic diverticula otherwise normal. cecal lipoma bx negative, normal TI. Next TCS 09/2014   COLONOSCOPY N/A 11/12/2014   Procedure: COLONOSCOPY;  Surgeon: Daneil Dolin, MD;  [facility];  Service: Endoscopy;   Laterality: N/A;  1215 - moved to 12:30 - Ginger to notify pt   COLONOSCOPY N/A 01/07/2017   Rourk: Grade 4 internal hemorrhoids, 5 mm ulcer in the sigmoid colon (benign bx), 7 mm polyp removed from the cecum (tubular adenoma), diverticulosis.next tcs in five years   ESOPHAGEAL BANDING N/A 12/20/2016   Procedure: ESOPHAGEAL BANDING;  Surgeon: Danie Binder, MD;  [facility];  Service: Endoscopy;  Laterality: N/A;   ESOPHAGEAL BANDING N/A 03/18/2017   Procedure: ESOPHAGEAL BANDING;  Surgeon: Daneil Dolin, MD;  [facility];  Service: Endoscopy;  Laterality: N/A;   ESOPHAGEAL BANDING  12/17/2017   Procedure: ESOPHAGEAL BANDING;  Surgeon: Daneil Dolin, MD;  [facility];  Service: Endoscopy;;   ESOPHAGEAL BANDING N/A 07/03/2020   Procedure: ESOPHAGEAL BANDING;  Surgeon: Daneil Dolin, MD;  [facility];  Service: Endoscopy;  Laterality: N/A;   ESOPHAGOGASTRODUODENOSCOPY  08/2006   barretts esophagus, no dyplasia   ESOPHAGOGASTRODUODENOSCOPY  03/2006   barretts esophagus,bx focal atypia c/w low grade dysplasia, SB bx negative for celiac   ESOPHAGOGASTRODUODENOSCOPY  09/23/09   3 columns of grade 2 esophageal varices/hiatal  hernia/3-4 cm segment Barrett's esophagus without dysplasia. Next EGD 09/2012   ESOPHAGOGASTRODUODENOSCOPY N/A 11/11/2012   Dr. Gala [name]- Barrett;s esophagus, esophageal varices, portal gastopathy. hiatal hernia   ESOPHAGOGASTRODUODENOSCOPY N/A 04/30/2016   Procedure: ESOPHAGOGASTRODUODENOSCOPY (EGD);  Surgeon: Daneil Dolin, MD;  [facility];  Service: Endoscopy;  Laterality: N/A;  815   ESOPHAGOGASTRODUODENOSCOPY N/A 12/20/2016   Procedure: ESOPHAGOGASTRODUODENOSCOPY (EGD);  Surgeon: Danie Binder, MD;  [facility];  Service: Endoscopy;  Laterality: N/A;   ESOPHAGOGASTRODUODENOSCOPY N/A 01/07/2017   Procedure: ESOPHAGOGASTRODUODENOSCOPY (EGD);  Surgeon: Daneil Dolin, MD;  [facility];  Service:  Endoscopy;  Laterality: N/A;   ESOPHAGOGASTRODUODENOSCOPY N/A 03/18/2017   Procedure: ESOPHAGOGASTRODUODENOSCOPY (EGD);  Surgeon: Daneil Dolin, MD;  [facility];  Service: Endoscopy;  Laterality: N/A;  1030    ESOPHAGOGASTRODUODENOSCOPY N/A 12/17/2017   Procedure: ESOPHAGOGASTRODUODENOSCOPY (EGD);  Surgeon: Daneil Dolin, MD;  [facility];  Service: Endoscopy;  Laterality: N/A;  10:30am   ESOPHAGOGASTRODUODENOSCOPY N/A 02/24/2019   Rourk: No esophageal varices seen, scars noted from prior banding.  5 cm segment of salmon-colored epithelium coming up from the GE junction consistent with Barrett's (bx with no dysplasia).  Portal gastropathy.  Small hiatal hernia.  Recommended repeat EGD in 18 months.   ESOPHAGOGASTRODUODENOSCOPY N/A 07/03/2020   Procedure: ESOPHAGOGASTRODUODENOSCOPY (EGD);  Surgeon: Daneil Dolin, MD;  [facility];  Service: Endoscopy;  Laterality: N/A;  10:15am   HEMORRHOID SURGERY  2008   small bowel capsule endoscopy  10/2009   normal    Social History   Socioeconomic History   Marital status: Married    Spouse name: Not on file   Number of children: Not on file   Years of education: Not on file   Highest education level: Not on file  Occupational History   Not on file  Tobacco Use   Smoking status: Never   Smokeless tobacco: Never  Vaping Use   Vaping Use: Never used  Substance and Sexual Activity   Alcohol use: No   Drug use: No   Sexual activity: Yes  Other Topics Concern   Not on file  Social History Narrative   Not on file   Social Determinants of Health   Financial Resource Strain: Not on file  Food Insecurity: Not on file  Transportation Needs: Not on file  Physical Activity: Not on file  Stress: Not on file  Social Connections: Not on file    Family History  Problem Relation Age of Onset   Alzheimer's disease Mother    CAD Father    Diabetes Mellitus II Father    Alzheimer's disease Father     Diabetes Mellitus II Sister    Colon cancer Sister 95       small, surgical excision; chemo/rad not needed   Cancer - Other Brother    Diabetes Mellitus II Brother    Hypertension Brother    Liver disease Neg Hx     Outpatient Encounter Medications as of 03/24/2022  Medication Sig   Accu-Chek Softclix Lancets lancets USE TO CHECK BLOOD SUGAR FOUR TIMES DAILY. Dx: E11.65   Cholecalciferol (VITAMIN D) 2000 units tablet Take 2,000 Units by mouth daily.   Continuous Blood Gluc Receiver (FREESTYLE LIBRE 2 READER) DEVI As directed   Continuous Blood Gluc Sensor (FREESTYLE LIBRE 2 SENSOR) MISC 1 Piece by Does not apply route every 14 (fourteen) days.   insulin aspart (NOVOLOG FLEXPEN) 100 UNIT/ML FlexPen Inject 12-18 Units  into the skin 3 (three) times daily before meals.   insulin degludec (TRESIBA FLEXTOUCH) 200 UNIT/ML FlexTouch Pen Inject 90 Units into the skin at bedtime.   lisinopril (PRINIVIL,ZESTRIL) 10 MG tablet Take 10 mg by mouth daily.   NOVOFINE 32G X 6 MM MISC USE ONE AS DIRECTED TWICE DAILY.   pantoprazole (PROTONIX) 40 MG tablet Take 1 tablet (40 mg total) by mouth 2 (two) times daily before a meal.   polyethylene glycol-electrolytes (NULYTELY) 420 g solution As directed   propranolol (INDERAL) 10 MG tablet TAKE THREE (3) TABLETS BY MOUTH EVERY MORNING AND 3 TABLETS EVERY EVENING   VICTOZA 18 MG/3ML SOLN Inject 1.8 mg into the skin daily.   [DISCONTINUED] insulin aspart (NOVOLOG FLEXPEN) 100 UNIT/ML FlexPen Inject 10-16 Units into the skin 3 (three) times daily before meals.   [DISCONTINUED] insulin degludec (TRESIBA FLEXTOUCH) 200 UNIT/ML FlexTouch Pen Inject 80 Units into the skin at bedtime.   No facility-administered encounter medications on file as of 03/24/2022.    ALLERGIES: Allergies  Allergen Reactions   Codeine Nausea And Vomiting    VACCINATION STATUS:  There is no immunization history on file for this patient.  Diabetes She presents for her follow-up  diabetic visit. She has type 2 diabetes mellitus. Onset time: She was diagnosed at approximate age of 6 years. Her disease course has been worsening. There are no hypoglycemic associated symptoms. Pertinent negatives for hypoglycemia include no confusion, headaches, pallor or seizures. Pertinent negatives for diabetes include no chest pain, no fatigue, no polydipsia, no polyphagia and no polyuria. (She did not bring any logs nor meter to review.  Patient verbally reports that she is having fluctuating glycemic profile including hypoglycemia.) There are no hypoglycemic complications. Symptoms are worsening. Diabetic complications include nephropathy. Risk factors for coronary artery disease include diabetes mellitus, hypertension, obesity, post-menopausal and family history. Her weight is fluctuating minimally. She is following a generally unhealthy diet. When asked about meal planning, she reported none. She has had a previous visit with a dietitian. She participates in exercise intermittently. Her home blood glucose trend is increasing steadily. Her breakfast blood glucose range is generally >200 mg/dl. Her lunch blood glucose range is generally >200 mg/dl. Her dinner blood glucose range is generally >200 mg/dl. Her bedtime blood glucose range is generally >200 mg/dl. Her overall blood glucose range is >200 mg/dl. Mickiewicz presents with continued hyperglycemic burden.  Her CGM device was downloaded and analyzed.  Her AGP report shows 80% time range, 92% above range.  Her average blood glucose is 286 mg per DL.  She has no hypoglycemia.  Her point-of-care A1c is 9% only slightly changed from her last visit A1c.  ) An ACE inhibitor/angiotensin II receptor blocker is being taken. Eye exam is current.  Hypertension This is a chronic problem. The current episode started more than 1 year ago. The problem is controlled. Pertinent negatives include no chest pain, headaches, palpitations or shortness of breath. Risk  factors for coronary artery disease include diabetes mellitus, family history, obesity, dyslipidemia, sedentary lifestyle and post-menopausal state. Past treatments include ACE inhibitors. Hypertensive end-organ damage includes kidney disease. Identifiable causes of hypertension include chronic renal disease.     Review of Systems  Constitutional:  Negative for chills, fatigue, fever and unexpected weight change.  HENT:  Negative for trouble swallowing and voice change.   Eyes:  Negative for visual disturbance.  Respiratory:  Negative for cough, shortness of breath and wheezing.   Cardiovascular:  Negative for chest pain, palpitations  and leg swelling.  Gastrointestinal:  Negative for diarrhea, nausea and vomiting.  Endocrine: Negative for cold intolerance, heat intolerance, polydipsia, polyphagia and polyuria.  Musculoskeletal:  Negative for arthralgias and myalgias.  Skin:  Negative for color change, pallor, rash and wound.  Neurological:  Negative for seizures and headaches.  Psychiatric/Behavioral:  Negative for confusion and suicidal ideas.     Objective:       03/24/2022    1:03 PM 01/20/2022   10:40 AM 12/17/2021   11:54 AM  Vitals with BMI  Height 5' 0"  5' 0"  5' 0"   Weight 191 lbs 13 oz 188 lbs 13 oz 188 lbs 6 oz  BMI 37.46 54.00 86.76  Systolic 195 093 267  Diastolic 54 66 64  Pulse 68 72 64    BP (!) 122/54   Pulse 68   Ht 5' (1.524 m)   Wt 191 lb 12.8 oz (87 kg)   BMI 37.46 kg/m   Wt Readings from Last 3 Encounters:  03/24/22 191 lb 12.8 oz (87 kg)  01/20/22 188 lb 12.8 oz (85.6 kg)  12/17/21 188 lb 6.4 oz (85.5 kg)      CMP ( most recent) CMP     Component Value Date/Time   NA 139 03/24/2022 1141   K 4.0 03/24/2022 1141   CL 112 (H) 03/24/2022 1141   CO2 26 03/24/2022 1141   GLUCOSE 98 03/24/2022 1141   BUN 14 03/24/2022 1141   BUN 15 01/25/2020 0000   CREATININE 1.62 (H) 03/24/2022 1141   CREATININE 1.62 (H) 03/28/2018 1146   CALCIUM 8.8 (L)  03/24/2022 1141   CALCIUM 8.8 10/16/2021 1354   PROT 6.6 03/24/2022 1141   ALBUMIN 3.3 (L) 03/24/2022 1141   AST 33 03/24/2022 1141   ALT 19 03/24/2022 1141   ALKPHOS 101 03/24/2022 1141   BILITOT 2.9 (H) 03/24/2022 1141   GFRNONAA 34 (L) 03/24/2022 1141   GFRAA 36 (L) 05/29/2020 1416     Diabetic Labs (most recent): Lab Results  Component Value Date   HGBA1C 9.0 (A) 03/24/2022   HGBA1C 9.3 (A) 11/26/2021   HGBA1C 7.6 (A) 08/25/2021   MICROALBUR 30 03/24/2022   MICROALBUR 25.8 (H) 12/23/2021   MICROALBUR 9.6 (H) 06/20/2021     Lipid Panel ( most recent) Lipid Panel     Component Value Date/Time   CHOL 143 11/24/2021 1239   TRIG 98 11/24/2021 1239   HDL 41 11/24/2021 1239   CHOLHDL 3.5 11/24/2021 1239   VLDL 20 11/24/2021 1239   LDLCALC 82 11/24/2021 1239      Lab Results  Component Value Date   TSH 2.577 11/24/2021   TSH 2.531 12/20/2020   TSH 2.46 01/25/2020   FREET4 0.92 11/24/2021   FREET4 0.97 12/20/2020      Assessment & Plan:   1. Uncontrolled type 2 diabetes mellitus with stage 3 chronic kidney disease ([place])  - Alisha Gobble Osmundson has currently uncontrolled symptomatic type 2 DM since 71 years of age.  Antara presents with continued hyperglycemic burden.  Her CGM device was downloaded and analyzed.  Her AGP report shows 80% time range, 92% above range.  Her average blood glucose is 286 mg per DL.  She has no hypoglycemia.  Her point-of-care A1c is 9% only slightly changed from her last visit A1c.     Recent labs reviewed. - I had a long discussion with her about the progressive nature of diabetes and the pathology behind its complications. -her diabetes is complicated  by CKD and she remains at a high risk for more acute and chronic complications which include CAD, CVA, CKD, retinopathy, and neuropathy. These are all discussed in detail with her.  - I have counseled her on diet  and weight management  by adopting a carbohydrate restricted/protein  rich diet. Patient is encouraged to switch to  unprocessed or minimally processed     complex starch and increased protein intake (animal or plant source), fruits, and vegetables. -  she is advised to stick to a routine mealtimes to eat 3 meals  a day and avoid unnecessary snacks ( to snack only to correct hypoglycemia).   Evidently, she is not utilizing her prescribed medication optimally.  She is scans  1-2 on average daily which will not allow her to utilize intensive treatment with basal/bolus insulin.   - she acknowledges that there is a room for improvement in her food and drink choices. - Suggestion is made for her to avoid simple carbohydrates  from her diet including Cakes, Sweet Desserts, Ice Cream, Soda (diet and regular), Sweet Tea, Candies, Chips, Cookies, Store Bought Juices, Alcohol , Artificial Sweeteners,  Coffee Creamer, and "Sugar-free" Products, Lemonade. This will help patient to have more stable blood glucose profile and potentially avoid unintended weight gain.  The following Lifestyle Medicine recommendations according to [place]  [facility]) were discussed and and offered to patient and she  agrees to start the journey:  A. Whole Foods, Plant-Based Nutrition comprising of fruits and vegetables, plant-based proteins, whole-grain carbohydrates was discussed in detail with the patient.   A list for source of those nutrients were also provided to the patient.  Patient will use only water or unsweetened tea for hydration. B.  The need to stay away from risky substances including alcohol, smoking; obtaining 7 to 9 hours of restorative sleep, at least 150 minutes of moderate intensity exercise weekly, the importance of healthy social connections,  and stress management techniques were discussed. C.  A full color page of  Calorie density of various food groups per pound showing examples of each food groups was provided to the patient.   - I have approached  her with the following individualized plan to manage  her diabetes and patient agrees:    -She has not engaged optimally for lifestyle medicine nor intensive treatment with basal/bolus insulin.   -Her inability to scan and document glycemic profile optimally is interfering with efforts to change and optimize her insulin treatment.    -I approached her again for monitoring blood glucose at least 4 times a day, increase her Tresiba to 90 units nightly, increase NovoLog to 12 units  3 times daily AC for Premeal blood glucose readings above 90 mg per DL, associated with monitoring of blood glucose 4 times a day.   She is advised to use her CGM at all times. - she is warned not to take insulin without proper monitoring per orders. - she is encouraged to call clinic for blood glucose levels less than 70 or above 200 mg /dl. - she is advised to continue Victoza 1.8 mg subcutaneously daily.    - she is not a candidate for Metformin, due to concurrent renal insufficiency.  - Specific targets for  A1c;  LDL, HDL,  and Triglycerides were discussed with the patient.  2) Blood Pressure /Hypertension:  -Her blood pressure is controlled to target. she is advised to continue her current medications including lisinopril 10 mg p.o. daily with breakfast .  3) Lipids/Hyperlipidemia:   Review of her recent lipid panel showed  controlled uncontrolled LDL at 82 slightly improving from 91.     she  is not on statins.  She reports statin intolerance.  Whole food plant-based diet discussed above will help with dyslipidemia as well.      4)  Weight/Diet:  Body mass index is 37.46 kg/m.  -   clearly complicating her diabetes care.   she is  a candidate for weight loss. I discussed with her the fact that loss of 5 - 10% of her  current body weight will have the most impact on her diabetes management.  Exercise, and detailed carbohydrates information provided  -  detailed on discharge instructions.  5) Chronic  Care/Health Maintenance:  -she  is on ACEI Statin medications and  is encouraged to initiate and continue to follow up with Ophthalmology, Dentist,  Podiatrist at least yearly or according to recommendations, and advised to   stay away from smoking. I have recommended yearly flu vaccine and pneumonia vaccine at least every 5 years; moderate intensity exercise for up to 150 minutes weekly; and  sleep for at least 7 hours a day.  6.  Vitamin D deficiency: She is advised to continue cholecalciferol 2000 units daily.   She recently had normal ABI, will be repeated in 5 years.  - she is  advised to maintain close follow up with Glenda Chroman, MD for primary care needs, as well as her other providers for optimal and coordinated care.    I spent 44 minutes in the care of the patient today including review of labs from [facility], Lipids, Thyroid Function, Hematology (current and previous including abstractions from other facilities); face-to-face time discussing  her blood glucose readings/logs, discussing hypoglycemia and hyperglycemia episodes and symptoms, medications doses, her options of short and long term treatment based on the latest standards of care / guidelines;  discussion about incorporating lifestyle medicine;  and documenting the encounter. Risk reduction counseling performed per USPSTF guidelines to reduce obesity and cardiovascular risk factors.     Please refer to Patient Instructions for Blood Glucose Monitoring and Insulin/Medications Dosing Guide"  in media tab for additional information. Please  also refer to " Patient Self Inventory" in the Media  tab for reviewed elements of pertinent patient history.  Alisha [name] and her husband Patrick Jupiter participated in the discussions, expressed understanding, and voiced agreement with the above plans.  All questions were answered to her satisfaction. she is encouraged to contact clinic should she have any questions or concerns prior to her return  visit.    Follow up plan: - Return in about 2 weeks (around 04/07/2022) for F/U with Meter/CGM [facility] Only - no Labs.  Glade Lloyd, MD [facility] [address] [place], [place] [address] Phone: [phone]  Fax: [phone]    03/24/2022, 3:55 PM  This note was partially dictated with voice recognition software. Similar sounding words can be transcribed inadequately or may not  be corrected upon review.

## 2022-03-26 ENCOUNTER — Other Ambulatory Visit (HOSPITAL_COMMUNITY)
Admission: RE | Admit: 2022-03-26 | Discharge: 2022-03-26 | Disposition: A | Discharge status: Home / Self Care | Payer: Medicare PPO | Source: Ambulatory Visit | Attending: Nephrology | Admitting: Nephrology

## 2022-03-26 ENCOUNTER — Encounter (HOSPITAL_COMMUNITY): Payer: Self-pay

## 2022-03-26 ENCOUNTER — Encounter (HOSPITAL_COMMUNITY)
Admission: RE | Admit: 2022-03-26 | Discharge: 2022-03-26 | Disposition: A | Discharge status: Home / Self Care | Payer: Medicare PPO | Source: Ambulatory Visit | Attending: Internal Medicine | Admitting: Internal Medicine

## 2022-03-26 VITALS — BP 121/54 | HR 73 | Temp 97.8°F | Resp 18 | Ht 60.0 in | Wt 191.8 lb

## 2022-03-26 DIAGNOSIS — I10 Essential (primary) hypertension: Secondary | ICD-10-CM | POA: Insufficient documentation

## 2022-03-26 DIAGNOSIS — Z01818 Encounter for other preprocedural examination: Secondary | ICD-10-CM | POA: Diagnosis not present

## 2022-03-26 LAB — CBC
HCT: 32.8 % — ABNORMAL LOW (ref 36.0–46.0)
Hemoglobin: 11.2 g/dL — ABNORMAL LOW (ref 12.0–15.0)
MCH: 31.2 pg (ref 26.0–34.0)
MCHC: 34.1 g/dL (ref 30.0–36.0)
MCV: 91.4 fL (ref 80.0–100.0)
Platelets: 52 10*3/uL — ABNORMAL LOW (ref 150–400)
RBC: 3.59 MIL/uL — ABNORMAL LOW (ref 3.87–5.11)
RDW: 14.9 % (ref 11.5–15.5)
WBC: 4.1 10*3/uL (ref 4.0–10.5)
nRBC: 0 % (ref 0.0–0.2)

## 2022-03-26 LAB — RENAL FUNCTION PANEL
Albumin: 3.1 g/dL — ABNORMAL LOW (ref 3.5–5.0)
Anion gap: 5 (ref 5–15)
BUN: 14 mg/dL (ref 8–23)
CO2: 22 mmol/L (ref 22–32)
Calcium: 8.7 mg/dL — ABNORMAL LOW (ref 8.9–10.3)
Chloride: 110 mmol/L (ref 98–111)
Creatinine, Ser: 1.65 mg/dL — ABNORMAL HIGH (ref 0.44–1.00)
GFR, Estimated: 33 mL/min — ABNORMAL LOW (ref >60)
Glucose, Bld: 116 mg/dL — ABNORMAL HIGH (ref 70–99)
Phosphorus: 3 mg/dL (ref 2.5–4.6)
Potassium: 4.1 mmol/L (ref 3.5–5.1)
Sodium: 137 mmol/L (ref 135–145)

## 2022-03-26 LAB — PROTEIN / CREATININE RATIO, URINE
Creatinine, Urine: 386.53 mg/dL
Protein Creatinine Ratio: 0.03 mg/mg{Cre} (ref 0.00–0.15)
Total Protein, Urine: 11 mg/dL

## 2022-03-26 LAB — METHYLMALONIC ACID, SERUM: Methylmalonic Acid, Quantitative: 183 nmol/L (ref 0–378)

## 2022-03-28 ENCOUNTER — Other Ambulatory Visit: Payer: Self-pay | Admitting: "Endocrinology

## 2022-03-31 ENCOUNTER — Inpatient Hospital Stay (HOSPITAL_COMMUNITY): Payer: Medicare PPO | Admitting: Physician Assistant

## 2022-03-31 VITALS — BP 126/57 | HR 65 | Temp 97.8°F | Resp 18 | Ht 60.0 in | Wt 187.2 lb

## 2022-03-31 DIAGNOSIS — E538 Deficiency of other specified B group vitamins: Secondary | ICD-10-CM | POA: Diagnosis not present

## 2022-03-31 DIAGNOSIS — D500 Iron deficiency anemia secondary to blood loss (chronic): Secondary | ICD-10-CM

## 2022-03-31 DIAGNOSIS — I129 Hypertensive chronic kidney disease with stage 1 through stage 4 chronic kidney disease, or unspecified chronic kidney disease: Secondary | ICD-10-CM | POA: Diagnosis not present

## 2022-03-31 DIAGNOSIS — R918 Other nonspecific abnormal finding of lung field: Secondary | ICD-10-CM | POA: Diagnosis not present

## 2022-03-31 DIAGNOSIS — K2270 Barrett's esophagus without dysplasia: Secondary | ICD-10-CM | POA: Diagnosis not present

## 2022-03-31 DIAGNOSIS — D696 Thrombocytopenia, unspecified: Secondary | ICD-10-CM | POA: Diagnosis not present

## 2022-03-31 DIAGNOSIS — K219 Gastro-esophageal reflux disease without esophagitis: Secondary | ICD-10-CM | POA: Diagnosis not present

## 2022-03-31 DIAGNOSIS — D509 Iron deficiency anemia, unspecified: Secondary | ICD-10-CM | POA: Diagnosis not present

## 2022-03-31 DIAGNOSIS — E785 Hyperlipidemia, unspecified: Secondary | ICD-10-CM | POA: Diagnosis not present

## 2022-03-31 DIAGNOSIS — N184 Chronic kidney disease, stage 4 (severe): Secondary | ICD-10-CM | POA: Diagnosis not present

## 2022-03-31 DIAGNOSIS — K7460 Unspecified cirrhosis of liver: Secondary | ICD-10-CM | POA: Diagnosis not present

## 2022-03-31 NOTE — Patient Instructions (Signed)
[place] at [facility] Discharge Instructions  You were seen today by Tarri Abernethy PA-C for your iron deficiency, B12, and low platelets.  Your levels are stable, and we will re-check in 6 months.    LABS: Return in 6 months for repeat labs   OTHER TESTS: None  MEDICATIONS: No changes.  FOLLOW-UP APPOINTMENT: Continue follow-up at [facility].  Return to [facility] in 6 months.   Thank you for choosing [place] at [facility] to provide your oncology and hematology care.  To afford each patient quality time with our provider, please arrive at least 15 minutes before your scheduled appointment time.   If you have a lab appointment with the [facility] please come in thru the Main Entrance and check in at the main information desk.  You need to re-schedule your appointment should you arrive 10 or more minutes late.  We strive to give you quality time with our providers, and arriving late affects you and other patients whose appointments are after yours.  Also, if you no show three or more times for appointments you may be dismissed from the clinic at the providers discretion.     Again, thank you for choosing [facility].  Our hope is that these requests will decrease the amount of time that you wait before being seen by our physicians.       _____________________________________________________________  Should you have questions after your visit to [facility], please contact our office at [phone] and follow the prompts.  Our office hours are 8:00 a.m. and 4:30 p.m. Monday - Friday.  Please note that voicemails left after 4:00 p.m. may not be returned until the following business day.  We are closed weekends and major holidays.  You do have access to a nurse 24-7, just call the main number to the clinic [phone] and do not press any options, hold on the line and a nurse will answer the phone.    For  prescription refill requests, have your pharmacy contact our office and allow 72 hours.    Due to Covid, you will need to wear a mask upon entering the hospital. If you do not have a mask, a mask will be given to you at the Main Entrance upon arrival. For doctor visits, patients may have 1 support person age 72 or older with them. For treatment visits, patients can not have anyone with them due to social distancing guidelines and our immunocompromised population.

## 2022-03-31 NOTE — Progress Notes (Signed)
Alisha [name], Alisha [name] [id number]   CLINIC:  Medical Oncology/Hematology  PCP:  Glenda Chroman, MD [place] [id number] [phone]   REASON FOR VISIT:  Follow-up for iron deficiency anemia (intermittent GI bleeding) and thrombocytopenia (cirrhosis)  CURRENT THERAPY: Intermittent IV iron infusions  INTERVAL HISTORY:  Alisha [name] 71 y.o. 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  She was last evaluated seen by Tarri Abernethy PA-C on 09/23/2021.  Since her last visit, she has undergone radioembolization of her hepatocellular carcinoma liver lesion.  Her [place] and other liver issues are being managed by [facility].    At today's visit, she reports feeling about the same.  She had COVID in April 2023.  Her chronic fatigue is at baseline. She has not had any GI bleeding events or esophageal banding since her last visit.  She denies any recent bleeding such as hematemesis, hematochezia, melena, or epistaxis. She denies any significant bruising or petechial rash. She has not noticed any pica, restless legs, headaches, chest pain, dyspnea on exertion, lightheadedness, or syncope.  No B symptoms such as fever, chills, night sweats, unintentional weight loss.  She has 50% energy and 40% appetite. She endorses that she is maintaining a stable weight.   REVIEW OF SYSTEMS:    Review of Systems  Constitutional:  Positive for fatigue. Negative for appetite change, chills, diaphoresis, fever and unexpected weight change.  HENT:   Negative for lump/mass and nosebleeds.   Eyes:  Negative for eye problems.  Respiratory:  Positive for cough (allergies). Negative for hemoptysis and shortness of breath.   Cardiovascular:  Negative for chest pain, leg swelling and palpitations.  Gastrointestinal:  Negative for abdominal pain, blood in stool,  constipation, diarrhea, nausea and vomiting.  Genitourinary:  Negative for hematuria.   Skin: Negative.   Neurological:  Negative for dizziness, headaches and light-headedness.  Hematological:  Does not bruise/bleed easily.     PAST MEDICAL/SURGICAL HISTORY:  Past Medical History:  Diagnosis Date   B12 deficiency 11/03/2016   B12 deficiency 11/03/2016   Barrett's esophagus    Cirrhosis of liver without mention of alcohol    hep B surface antigen and HCV ab negative.pt has not had hepatitis A and B vaccines.U/S on 07/04/13 shows cirrhosis.   Diverticula of colon    pancolonic   Esophageal reflux    Esophageal varices (HCC)    Hiatal hernia    Iron deficiency anemia, unspecified    Other and unspecified hyperlipidemia    Portal hypertensive gastropathy (HCC)    Tubular adenoma    Type II or unspecified type diabetes mellitus without mention of complication, not stated as uncontrolled    Unspecified essential hypertension    Unspecified hemorrhoids without mention of complication    Past Surgical History:  Procedure Laterality Date   ABDOMINAL HYSTERECTOMY     BIOPSY  04/30/2016   Procedure: BIOPSY;  Surgeon: Daneil Dolin, MD;  [facility];  Service: Endoscopy;;  esophagus   BIOPSY  02/24/2019   Procedure: BIOPSY;  Surgeon: Daneil Dolin, MD;  [facility];  Service: Endoscopy;;   CESAREAN SECTION     x 2   COLONOSCOPY  03/2006   left sided diverticula, splenic flexure tublar adenoma   COLONOSCOPY  07/2002   villous tubular adenoma in rectum   COLONOSCOPY  09/23/09  external hemorrhoids/scattered pan colonic diverticula otherwise normal. cecal lipoma bx negative, normal TI. Next TCS 09/2014   COLONOSCOPY N/A 11/12/2014   Procedure: COLONOSCOPY;  Surgeon: Daneil Dolin, MD;  [facility];  Service: Endoscopy;  Laterality: N/A;  1215 - moved to 12:30 - Ginger to notify pt   COLONOSCOPY N/A 01/07/2017   Rourk: Grade 4 internal hemorrhoids, 5 mm  ulcer in the sigmoid colon (benign bx), 7 mm polyp removed from the cecum (tubular adenoma), diverticulosis.next tcs in five years   ESOPHAGEAL BANDING N/A 12/20/2016   Procedure: ESOPHAGEAL BANDING;  Surgeon: Danie Binder, MD;  [facility];  Service: Endoscopy;  Laterality: N/A;   ESOPHAGEAL BANDING N/A 03/18/2017   Procedure: ESOPHAGEAL BANDING;  Surgeon: Daneil Dolin, MD;  [facility];  Service: Endoscopy;  Laterality: N/A;   ESOPHAGEAL BANDING  12/17/2017   Procedure: ESOPHAGEAL BANDING;  Surgeon: Daneil Dolin, MD;  [facility];  Service: Endoscopy;;   ESOPHAGEAL BANDING N/A 07/03/2020   Procedure: ESOPHAGEAL BANDING;  Surgeon: Daneil Dolin, MD;  [facility];  Service: Endoscopy;  Laterality: N/A;   ESOPHAGOGASTRODUODENOSCOPY  08/2006   barretts esophagus, no dyplasia   ESOPHAGOGASTRODUODENOSCOPY  03/2006   barretts esophagus,bx focal atypia c/w low grade dysplasia, SB bx negative for celiac   ESOPHAGOGASTRODUODENOSCOPY  09/23/09   3 columns of grade 2 esophageal varices/hiatal hernia/3-4 cm segment Barrett's esophagus without dysplasia. Next EGD 09/2012   ESOPHAGOGASTRODUODENOSCOPY N/A 11/11/2012   Dr. Gala Romney- Barrett;s esophagus, esophageal varices, portal gastopathy. hiatal hernia   ESOPHAGOGASTRODUODENOSCOPY N/A 04/30/2016   Procedure: ESOPHAGOGASTRODUODENOSCOPY (EGD);  Surgeon: Daneil Dolin, MD;  [facility];  Service: Endoscopy;  Laterality: N/A;  815   ESOPHAGOGASTRODUODENOSCOPY N/A 12/20/2016   Procedure: ESOPHAGOGASTRODUODENOSCOPY (EGD);  Surgeon: Danie Binder, MD;  [facility];  Service: Endoscopy;  Laterality: N/A;   ESOPHAGOGASTRODUODENOSCOPY N/A 01/07/2017   Procedure: ESOPHAGOGASTRODUODENOSCOPY (EGD);  Surgeon: Daneil Dolin, MD;  [facility];  Service: Endoscopy;  Laterality: N/A;   ESOPHAGOGASTRODUODENOSCOPY N/A 03/18/2017   Procedure: ESOPHAGOGASTRODUODENOSCOPY (EGD);  Surgeon: Daneil Dolin, MD;  [facility];  Service: Endoscopy;  Laterality: N/A;  1030    ESOPHAGOGASTRODUODENOSCOPY N/A 12/17/2017   Procedure: ESOPHAGOGASTRODUODENOSCOPY (EGD);  Surgeon: Daneil Dolin, MD;  [facility];  Service: Endoscopy;  Laterality: N/A;  10:30am   ESOPHAGOGASTRODUODENOSCOPY N/A 02/24/2019   Rourk: No esophageal varices seen, scars noted from prior banding.  5 cm segment of salmon-colored epithelium coming up from the GE junction consistent with Barrett's (bx with no dysplasia).  Portal gastropathy.  Small hiatal hernia.  Recommended repeat EGD in 18 months.   ESOPHAGOGASTRODUODENOSCOPY N/A 07/03/2020   Procedure: ESOPHAGOGASTRODUODENOSCOPY (EGD);  Surgeon: Daneil Dolin, MD;  [facility];  Service: Endoscopy;  Laterality: N/A;  10:15am   HEMORRHOID SURGERY  2008   small bowel capsule endoscopy  10/2009   normal     SOCIAL HISTORY:  Social History   Socioeconomic History   Marital status: Married    Spouse name: Not on file   Number of children: Not on file   Years of education: Not on file   Highest education level: Not on file  Occupational History   Not on file  Tobacco Use   Smoking status: Never   Smokeless tobacco: Never  Vaping Use   Vaping Use: Never used  Substance and Sexual Activity   Alcohol use: No  Drug use: No   Sexual activity: Yes  Other Topics Concern   Not on file  Social History Narrative   Not on file   Social Determinants of Health   Financial Resource Strain: Not on file  Food Insecurity: Not on file  Transportation Needs: Not on file  Physical Activity: Not on file  Stress: Not on file  Social Connections: Not on file  Intimate Partner Violence: Not on file    FAMILY HISTORY:  Family History  Problem Relation Age of Onset   Alzheimer's disease Mother    CAD Father    Diabetes Mellitus II Father    Alzheimer's disease Father    Diabetes Mellitus II Sister    Colon cancer Sister 44        small, surgical excision; chemo/rad not needed   Cancer - Other Brother    Diabetes Mellitus II Brother    Hypertension Brother    Liver disease Neg Hx     CURRENT MEDICATIONS:  Outpatient Encounter Medications as of 03/31/2022  Medication Sig Note   Accu-Chek Softclix Lancets lancets USE TO CHECK BLOOD SUGAR FOUR TIMES DAILY. Dx: E11.65    Cholecalciferol (VITAMIN D) 2000 units tablet Take 2,000 Units by mouth daily.    Continuous Blood Gluc Receiver (FREESTYLE LIBRE 2 READER) DEVI As directed    Continuous Blood Gluc Sensor (FREESTYLE LIBRE 2 SENSOR) MISC 1 Piece by Does not apply route every 14 (fourteen) days.    insulin aspart (NOVOLOG FLEXPEN) 100 UNIT/ML FlexPen Inject 12-18 Units into the skin 3 (three) times daily before meals.    insulin degludec (TRESIBA FLEXTOUCH) 200 UNIT/ML FlexTouch Pen Inject 90 Units into the skin at bedtime.    lisinopril (PRINIVIL,ZESTRIL) 10 MG tablet Take 10 mg by mouth daily.    NOVOFINE 32G X 6 MM MISC USE ONE AS DIRECTED TWICE DAILY.    pantoprazole (PROTONIX) 40 MG tablet Take 1 tablet (40 mg total) by mouth 2 (two) times daily before a meal.    polyethylene glycol-electrolytes (NULYTELY) 420 g solution As directed 03/19/2022: For procedure 04/01/22   propranolol (INDERAL) 10 MG tablet TAKE THREE (3) TABLETS BY MOUTH EVERY MORNING AND 3 TABLETS EVERY EVENING    VICTOZA 18 MG/3ML SOLN Inject 1.8 mg into the skin daily.    No facility-administered encounter medications on file as of 03/31/2022.    ALLERGIES:  Allergies  Allergen Reactions   Codeine Nausea And Vomiting    PHYSICAL EXAM:    ECOG PERFORMANCE STATUS: 1 - Symptomatic but completely ambulatory  There were no vitals filed for this visit. There were no vitals filed for this visit. Physical Exam Constitutional:      Appearance: Normal appearance. She is obese.  HENT:     Head: Normocephalic and atraumatic.     Mouth/Throat:     Mouth: Mucous membranes are moist.  Eyes:      Extraocular Movements: Extraocular movements intact.     Pupils: Pupils are equal, round, and reactive to light.  Cardiovascular:     Rate and Rhythm: Normal rate and regular rhythm.     Pulses: Normal pulses.     Heart sounds: Normal heart sounds.  Pulmonary:     Effort: Pulmonary effort is normal.     Breath sounds: Normal breath sounds.  Abdominal:     General: Bowel sounds are normal.     Palpations: Abdomen is soft.     Tenderness: There is no abdominal tenderness.  Musculoskeletal:  General: No swelling.     Right lower leg: No edema.     Left lower leg: No edema.  Lymphadenopathy:     Cervical: No cervical adenopathy.  Skin:    General: Skin is warm and dry.  Neurological:     General: No focal deficit present.     Mental Status: She is alert and oriented to person, place, and time.  Psychiatric:        Mood and Affect: Mood normal.        Behavior: Behavior normal.    LABORATORY DATA:  I have reviewed the labs as listed.  CBC    Component Value Date/Time   WBC 4.1 03/26/2022 1517   RBC 3.59 (L) 03/26/2022 1517   HGB 11.2 (L) 03/26/2022 1517   HCT 32.8 (L) 03/26/2022 1517   PLT 52 (L) 03/26/2022 1517   MCV 91.4 03/26/2022 1517   MCH 31.2 03/26/2022 1517   MCHC 34.1 03/26/2022 1517   RDW 14.9 03/26/2022 1517   LYMPHSABS 0.8 03/24/2022 1141   MONOABS 0.3 03/24/2022 1141   EOSABS 0.1 03/24/2022 1141   BASOSABS 0.0 03/24/2022 1141      Latest Ref Rng & Units 03/26/2022    3:18 PM 03/24/2022   11:41 AM 12/23/2021    3:15 PM  CMP  Glucose 70 - 99 mg/dL 116  98  240   BUN 8 - 23 mg/dL 14  14  14    Creatinine 0.44 - 1.00 mg/dL 1.65  1.62  1.66   Sodium 135 - 145 mmol/L 137  139  135   Potassium 3.5 - 5.1 mmol/L 4.1  4.0  4.0   Chloride 98 - 111 mmol/L 110  112  107   CO2 22 - 32 mmol/L 22  26  23    Calcium 8.9 - 10.3 mg/dL 8.7  8.8  9.1   Total Protein 6.5 - 8.1 g/dL  6.6    Total Bilirubin 0.3 - 1.2 mg/dL  2.9    Alkaline Phos 38 - 126 U/L  101     AST 15 - 41 U/L  33    ALT 0 - 44 U/L  19      DIAGNOSTIC IMAGING:  I have independently reviewed the relevant imaging and discussed with the patient.  ASSESSMENT & PLAN: 1.  Iron deficiency anemia secondary to chronic intermittent GI bleeding - She has a history of GI bleeding related to portal gastropathy and gastroesophageal varices, has required multiple banding procedures in the past - Malabsorption of iron due to gastropathy as well as PPI use twice daily - Patient is not on oral iron supplementation - Requires intermittent IV Feraheme, most recently given in September 2021 - Most recent labs (03/24/2022) with Hgb 11.9.  Iron saturation 48 %.  Ferritin 207. - Denies recent bleeding events, no melena or bright red blood per rectum      - PLAN:  No indication for IV iron supplementation at this time. - Repeat labs and RTC in 6 months, or sooner if indicated.   2.  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   3.  Vitamin B12 deficiency -  Most recent labs (03/24/2022) show B12 at 603 with normal methylmalonic acid - Patient is currently not taking any vitamin B12 supplements (these were stopped by her endocrinologist) - PLAN: Recheck vitamin B12 and methylmalonic acid in 6 months.   4.  CKD stage IIIb/IV - Creatinine stable at baseline, 1.6 to on 03/24/2022 with GFR 34 - Follows with Dr. Theador Hawthorne   5.  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 Gala Romney, who she follows with for local/general GI care   6.  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  Other history - PMH: Cirrhosis, chronic kidney disease, congestive heart failure, type 2 diabetes, hypertension - SOCIAL: Lifelong non-smoker.  No tobacco, alcohol, illicit drug use.   PLAN SUMMARY & DISPOSITION:   Labs in 6 months RTC after labs  All questions were answered. The patient knows to call the clinic with any problems, questions or concerns.  Medical decision making: Moderate    Time spent on visit: I spent 20 minutes counseling the patient face to face. The total time spent in the appointment was 30 minutes and more than 50% was on counseling.   [name], PA-C   03/31/22 10:42 AM

## 2022-04-01 ENCOUNTER — Ambulatory Visit (HOSPITAL_BASED_OUTPATIENT_CLINIC_OR_DEPARTMENT_OTHER): Payer: Medicare PPO | Admitting: Anesthesiology

## 2022-04-01 ENCOUNTER — Other Ambulatory Visit: Payer: Self-pay

## 2022-04-01 ENCOUNTER — Encounter (HOSPITAL_COMMUNITY)
Admission: RE | Disposition: A | Discharge status: Home / Self Care | Payer: Self-pay | Source: Home / Self Care | Attending: Internal Medicine

## 2022-04-01 ENCOUNTER — Encounter: Payer: Self-pay | Admitting: Internal Medicine

## 2022-04-01 ENCOUNTER — Encounter (HOSPITAL_COMMUNITY): Payer: Self-pay | Admitting: Internal Medicine

## 2022-04-01 ENCOUNTER — Ambulatory Visit (HOSPITAL_COMMUNITY)
Admission: RE | Admit: 2022-04-01 | Discharge: 2022-04-01 | Disposition: A | Discharge status: Home / Self Care | Payer: Medicare PPO | Attending: Internal Medicine | Admitting: Internal Medicine

## 2022-04-01 ENCOUNTER — Ambulatory Visit (HOSPITAL_COMMUNITY): Payer: Medicare PPO | Admitting: Anesthesiology

## 2022-04-01 DIAGNOSIS — G709 Myoneural disorder, unspecified: Secondary | ICD-10-CM | POA: Insufficient documentation

## 2022-04-01 DIAGNOSIS — I8500 Esophageal varices without bleeding: Secondary | ICD-10-CM

## 2022-04-01 DIAGNOSIS — I10 Essential (primary) hypertension: Secondary | ICD-10-CM | POA: Diagnosis not present

## 2022-04-01 DIAGNOSIS — K219 Gastro-esophageal reflux disease without esophagitis: Secondary | ICD-10-CM | POA: Insufficient documentation

## 2022-04-01 DIAGNOSIS — E119 Type 2 diabetes mellitus without complications: Secondary | ICD-10-CM | POA: Insufficient documentation

## 2022-04-01 DIAGNOSIS — K766 Portal hypertension: Secondary | ICD-10-CM | POA: Insufficient documentation

## 2022-04-01 DIAGNOSIS — D509 Iron deficiency anemia, unspecified: Secondary | ICD-10-CM | POA: Insufficient documentation

## 2022-04-01 DIAGNOSIS — Z79899 Other long term (current) drug therapy: Secondary | ICD-10-CM | POA: Insufficient documentation

## 2022-04-01 DIAGNOSIS — K5730 Diverticulosis of large intestine without perforation or abscess without bleeding: Secondary | ICD-10-CM | POA: Insufficient documentation

## 2022-04-01 DIAGNOSIS — K449 Diaphragmatic hernia without obstruction or gangrene: Secondary | ICD-10-CM

## 2022-04-01 DIAGNOSIS — K7460 Unspecified cirrhosis of liver: Secondary | ICD-10-CM | POA: Diagnosis not present

## 2022-04-01 DIAGNOSIS — D124 Benign neoplasm of descending colon: Secondary | ICD-10-CM | POA: Insufficient documentation

## 2022-04-01 DIAGNOSIS — Z794 Long term (current) use of insulin: Secondary | ICD-10-CM | POA: Diagnosis not present

## 2022-04-01 DIAGNOSIS — D122 Benign neoplasm of ascending colon: Secondary | ICD-10-CM | POA: Diagnosis not present

## 2022-04-01 DIAGNOSIS — K649 Unspecified hemorrhoids: Secondary | ICD-10-CM | POA: Insufficient documentation

## 2022-04-01 DIAGNOSIS — I8510 Secondary esophageal varices without bleeding: Secondary | ICD-10-CM

## 2022-04-01 DIAGNOSIS — K3189 Other diseases of stomach and duodenum: Secondary | ICD-10-CM | POA: Diagnosis not present

## 2022-04-01 DIAGNOSIS — Z86010 Personal history of colonic polyps: Secondary | ICD-10-CM

## 2022-04-01 HISTORY — PX: ESOPHAGEAL BANDING: SHX5518

## 2022-04-01 HISTORY — PX: ESOPHAGOGASTRODUODENOSCOPY (EGD) WITH PROPOFOL: SHX5813

## 2022-04-01 HISTORY — PX: POLYPECTOMY: SHX5525

## 2022-04-01 HISTORY — PX: COLONOSCOPY WITH PROPOFOL: SHX5780

## 2022-04-01 LAB — GLUCOSE, CAPILLARY: Glucose-Capillary: 93 mg/dL (ref 70–99)

## 2022-04-01 SURGERY — COLONOSCOPY WITH PROPOFOL
Anesthesia: General

## 2022-04-01 MED ORDER — PROPOFOL 500 MG/50ML IV EMUL
INTRAVENOUS | Status: DC | PRN
  Administered 2022-04-01: 180 ug/kg/min via INTRAVENOUS

## 2022-04-01 MED ORDER — PROPOFOL 10 MG/ML IV BOLUS
INTRAVENOUS | Status: DC | PRN
  Administered 2022-04-01: 80 mg via INTRAVENOUS

## 2022-04-01 MED ORDER — PROPOFOL 500 MG/50ML IV EMUL
INTRAVENOUS | Status: AC
  Filled 2022-04-01: qty 50

## 2022-04-01 MED ORDER — LACTATED RINGERS IV SOLN: INTRAVENOUS | Status: DC | PRN

## 2022-04-01 NOTE — H&P (Signed)
@LOGO @   Primary Care Physician:  Glenda Chroman, MD Primary Gastroenterologist:  Dr. Gala Romney  Pre-Procedure History & Physical: HPI:  Alisha [name] is a 71 y.o. female here for surveillance EGD.  History of esophageal variceal bleeding and band ligation on Inderal.  History of iron deficiency anemia multiple colonic polyps some advanced over time; colonoscopy needed at this time for IDA and history of polyps. Past Medical History:  Diagnosis Date   B12 deficiency 11/03/2016   B12 deficiency 11/03/2016   Barrett's esophagus    Cirrhosis of liver without mention of alcohol    hep B surface antigen and HCV ab negative.pt has not had hepatitis A and B vaccines.U/S on 07/04/13 shows cirrhosis.   Diverticula of colon    pancolonic   Esophageal reflux    Esophageal varices (HCC)    Hiatal hernia    Iron deficiency anemia, unspecified    Other and unspecified hyperlipidemia    Portal hypertensive gastropathy (HCC)    Tubular adenoma    Type II or unspecified type diabetes mellitus without mention of complication, not stated as uncontrolled    Unspecified essential hypertension    Unspecified hemorrhoids without mention of complication     Past Surgical History:  Procedure Laterality Date   ABDOMINAL HYSTERECTOMY     BIOPSY  04/30/2016   Procedure: BIOPSY;  Surgeon: Daneil Dolin, MD;  [facility];  Service: Endoscopy;;  esophagus   BIOPSY  02/24/2019   Procedure: BIOPSY;  Surgeon: Daneil Dolin, MD;  [facility];  Service: Endoscopy;;   CESAREAN SECTION     x 2   COLONOSCOPY  03/2006   left sided diverticula, splenic flexure tublar adenoma   COLONOSCOPY  07/2002   villous tubular adenoma in rectum   COLONOSCOPY  09/23/09   external hemorrhoids/scattered pan colonic diverticula otherwise normal. cecal lipoma bx negative, normal TI. Next TCS 09/2014   COLONOSCOPY N/A 11/12/2014   Procedure: COLONOSCOPY;  Surgeon: Daneil Dolin, MD;  [facility];   Service: Endoscopy;  Laterality: N/A;  1215 - moved to 12:30 - Ginger to notify pt   COLONOSCOPY N/A 01/07/2017   Hawley Michel: Grade 4 internal hemorrhoids, 5 mm ulcer in the sigmoid colon (benign bx), 7 mm polyp removed from the cecum (tubular adenoma), diverticulosis.next tcs in five years   ESOPHAGEAL BANDING N/A 12/20/2016   Procedure: ESOPHAGEAL BANDING;  Surgeon: Danie Binder, MD;  [facility];  Service: Endoscopy;  Laterality: N/A;   ESOPHAGEAL BANDING N/A 03/18/2017   Procedure: ESOPHAGEAL BANDING;  Surgeon: Daneil Dolin, MD;  [facility];  Service: Endoscopy;  Laterality: N/A;   ESOPHAGEAL BANDING  12/17/2017   Procedure: ESOPHAGEAL BANDING;  Surgeon: Daneil Dolin, MD;  [facility];  Service: Endoscopy;;   ESOPHAGEAL BANDING N/A 07/03/2020   Procedure: ESOPHAGEAL BANDING;  Surgeon: Daneil Dolin, MD;  [facility];  Service: Endoscopy;  Laterality: N/A;   ESOPHAGOGASTRODUODENOSCOPY  08/2006   barretts esophagus, no dyplasia   ESOPHAGOGASTRODUODENOSCOPY  03/2006   barretts esophagus,bx focal atypia c/w low grade dysplasia, SB bx negative for celiac   ESOPHAGOGASTRODUODENOSCOPY  09/23/09   3 columns of grade 2 esophageal varices/hiatal hernia/3-4 cm segment Barrett's esophagus without dysplasia. Next EGD 09/2012   ESOPHAGOGASTRODUODENOSCOPY N/A 11/11/2012   Dr. Gala Romney- Barrett;s esophagus, esophageal varices, portal gastopathy. hiatal hernia   ESOPHAGOGASTRODUODENOSCOPY N/A 04/30/2016   Procedure: ESOPHAGOGASTRODUODENOSCOPY (EGD);  Surgeon: Daneil Dolin, MD;  [facility];  Service: Endoscopy;  Laterality: N/A;  815   ESOPHAGOGASTRODUODENOSCOPY N/A 12/20/2016   Procedure: ESOPHAGOGASTRODUODENOSCOPY (EGD);  Surgeon: Danie Binder, MD;  [facility];  Service: Endoscopy;  Laterality: N/A;   ESOPHAGOGASTRODUODENOSCOPY N/A 01/07/2017   Procedure: ESOPHAGOGASTRODUODENOSCOPY (EGD);  Surgeon: Daneil Dolin, MD;  [facility];  Service: Endoscopy;  Laterality: N/A;   ESOPHAGOGASTRODUODENOSCOPY N/A 03/18/2017   Procedure: ESOPHAGOGASTRODUODENOSCOPY (EGD);  Surgeon: Daneil Dolin, MD;  [facility];  Service: Endoscopy;  Laterality: N/A;  1030    ESOPHAGOGASTRODUODENOSCOPY N/A 12/17/2017   Procedure: ESOPHAGOGASTRODUODENOSCOPY (EGD);  Surgeon: Daneil Dolin, MD;  [facility];  Service: Endoscopy;  Laterality: N/A;  10:30am   ESOPHAGOGASTRODUODENOSCOPY N/A 02/24/2019   Zafiro Routson: No esophageal varices seen, scars noted from prior banding.  5 cm segment of salmon-colored epithelium coming up from the GE junction consistent with Barrett's (bx with no dysplasia).  Portal gastropathy.  Small hiatal hernia.  Recommended repeat EGD in 18 months.   ESOPHAGOGASTRODUODENOSCOPY N/A 07/03/2020   Procedure: ESOPHAGOGASTRODUODENOSCOPY (EGD);  Surgeon: Daneil Dolin, MD;  [facility];  Service: Endoscopy;  Laterality: N/A;  10:15am   HEMORRHOID SURGERY  2008   small bowel capsule endoscopy  10/2009   normal    Prior to Admission medications   Medication Sig Start Date End Date Taking? Authorizing Provider  Accu-Chek Softclix Lancets lancets USE TO CHECK BLOOD SUGAR FOUR TIMES DAILY. Dx: E11.65 05/07/21  Yes [name], MD  Cholecalciferol (VITAMIN D) 2000 units tablet Take 2,000 Units by mouth daily.   Yes [provider]  Continuous Blood Gluc Receiver (FREESTYLE LIBRE 2 READER) DEVI As directed 04/29/21  Yes Nida, Marella Chimes, MD  Continuous Blood Gluc Sensor (FREESTYLE LIBRE 2 SENSOR) MISC 1 Piece by Does not apply route every 14 (fourteen) days. 04/29/21  Yes Nida, Marella Chimes, MD  insulin aspart (NOVOLOG FLEXPEN) 100 UNIT/ML FlexPen Inject 12-18 Units into the skin 3 (three) times daily before meals. 03/24/22  Yes Nida, Marella Chimes, MD  insulin degludec (TRESIBA FLEXTOUCH) 200 UNIT/ML FlexTouch Pen Inject 90 Units into the skin at bedtime. 03/30/22  Yes Nida,  Marella Chimes, MD  lisinopril (PRINIVIL,ZESTRIL) 10 MG tablet Take 10 mg by mouth daily. 04/09/12  Yes [provider]  NOVOFINE 32G X 6 MM MISC USE ONE AS DIRECTED TWICE DAILY. 09/15/18  Yes [provider]  pantoprazole (PROTONIX) 40 MG tablet Take 1 tablet (40 mg total) by mouth 2 (two) times daily before a meal. 12/24/16  Yes Orvan Falconer, MD  polyethylene glycol-electrolytes (NULYTELY) 420 g solution As directed 02/06/22  Yes Shadrack Brummitt, Cristopher Estimable, MD  propranolol (INDERAL) 10 MG tablet TAKE THREE (3) TABLETS BY MOUTH EVERY MORNING AND 3 TABLETS EVERY EVENING 02/11/22  Yes Breanna Shorkey, Cristopher Estimable, MD  VICTOZA 18 MG/3ML SOLN Inject 1.8 mg into the skin daily. 04/15/12  Yes [provider]    Allergies as of 02/06/2022 - Review Complete 01/20/2022  Allergen Reaction Noted   Codeine Nausea And Vomiting 04/19/2012    Family History  Problem Relation Age of Onset   Alzheimer's disease Mother    CAD Father    Diabetes Mellitus II Father    Alzheimer's disease Father    Diabetes Mellitus II Sister    Colon cancer Sister 15       small, surgical excision; chemo/rad not needed   Cancer - Other Brother    Diabetes Mellitus II Brother  Hypertension Brother    Liver disease Neg Hx     Social History   Socioeconomic History   Marital status: Married    Spouse name: Not on file   Number of children: Not on file   Years of education: Not on file   Highest education level: Not on file  Occupational History   Not on file  Tobacco Use   Smoking status: Never   Smokeless tobacco: Never  Vaping Use   Vaping Use: Never used  Substance and Sexual Activity   Alcohol use: No   Drug use: No   Sexual activity: Yes  Other Topics Concern   Not on file  Social History Narrative   Not on file   Social Determinants of Health   Financial Resource Strain: Not on file  Food Insecurity: Not on file  Transportation Needs: Not on file  Physical Activity: Not on file  Stress: Not on  file  Social Connections: Not on file  Intimate Partner Violence: Not on file    Review of Systems: See HPI, otherwise negative ROS  Physical Exam: BP (!) 119/57 (BP Location: Left Arm)   Pulse 64   Temp 98.2 F (36.8 C)   Resp 17   SpO2 99%  General:   Alert,  Well-developed, well-nourished, pleasant and cooperative in NAD Lungs:  Clear throughout to auscultation.   No wheezes, crackles, or rhonchi. No acute distress. Heart:  Regular rate and rhythm; no murmurs, clicks, rubs,  or gallops. Abdomen: Non-distended, normal bowel sounds.  Soft and nontender without appreciable mass or hepatosplenomegaly.  Pulses:  Normal pulses noted. Extremities:  Without clubbing or edema.  Impression/Plan: 71 year old lady with [name] cirrhosis here for follow-up EGD.  History of variceal hemorrhage previously.  History of EBL previously on Inderal.  History of IDA ongoing and multiple colonic polyps some advanced removed over time here for updated colonoscopy.  I have offered the patient both an EGD and colonoscopy today.  The risks, benefits, limitations, imponderables and alternatives regarding both EGD and colonoscopy have been reviewed with the patient. Questions have been answered. All parties agreeable.       Notice: This dictation was prepared with Dragon dictation along with smaller phrase technology. Any transcriptional errors that result from this process are unintentional and may not be corrected upon review.

## 2022-04-01 NOTE — Op Note (Signed)
[facility] Patient Name: Alisha [name] Procedure Date: 04/01/2022 7:56 AM MRN: [id number] Date of Birth: 03-23-1951 Attending MD: Norvel Richards , MD CSN: [id number] Age: 71 Admit Type: Outpatient Procedure:                Upper GI endoscopy Indications:              Screening procedure, Esophageal varices Providers:                Norvel Richards, MD, Everardo Pacific, Aram Candela, Lurline Del, RN Referring MD:              Medicines:                Propofol per Anesthesia Complications:            No immediate complications. Estimated Blood Loss:     Estimated blood loss: none. Procedure:                After obtaining informed consent, the endoscope was                            passed under direct vision. Throughout the                            procedure, the patient's blood pressure, pulse, and                            oxygen saturations were monitored continuously. The                            [id number]-H190 ([id number]) scope was introduced through the                            mouth, and advanced to the third part of duodenum.                            The upper GI endoscopy was accomplished without                            difficulty. The patient tolerated the procedure                            well. Scope In: 8:29:55 AM Scope Out: 9:07:08 AM Scope Withdrawal Time: 0 hours 12 minutes 10 seconds  Total Procedure Duration: 0 hours 37 minutes 13 seconds  Findings:      4 columns grade 2-3 esophageal varices. 1 column underlying tongue of       Barrett's epithelium distally. 1 cherry red spot present. Portal       hypertensive gastropathy. No gastric varices. No ulcer. Patent pylorus.       Normal-appearing first second third portion of duodenum. Scope       withdrawn. Microvasive 7 shot bander loaded up scope reintroduced in the       esophagus 1 band placed on each of the 4 columns. Good hemostasis  maintained. Impression:               - Esophageal varices. Bleeding stigmata. Status                            post esophageal band ligation                           -Portal hypertensive gastropathy; small hiatal                            hernia Moderate Sedation:      Moderate (conscious) sedation was personally administered by an       anesthesia professional. The following parameters were monitored: oxygen       saturation, heart rate, blood pressure, respiratory rate, EKG, adequacy       of pulmonary ventilation, and response to care. Recommendation:           - Patient has a contact number available for                            emergencies. The signs and symptoms of potential                            delayed complications were discussed with the                            patient. Return to normal activities tomorrow.                            Written discharge instructions were provided to the                            patient.                           - Advance diet as tolerated. Continue Inderal.                            Repeat EGD in 3 months. See colonoscopy report. Procedure Code(s):        --- Professional ---                           [phone], Esophagogastroduodenoscopy, flexible,                            transoral; diagnostic, including collection of                            specimen(s) by brushing or washing, when performed                            (separate procedure) Diagnosis Code(s):        --- Professional ---                           I85.00, Esophageal varices without bleeding  Z13.810, Encounter for screening for upper                            gastrointestinal disorder CPT copyright 2019 American Medical Association. All rights reserved. The codes documented in this report are preliminary and upon coder review may  be revised to meet current compliance requirements. Cristopher Estimable. [name], MD Norvel Richards,  MD 04/01/2022 9:10:52 AM This report has been signed electronically. Number of Addenda: 0

## 2022-04-01 NOTE — Discharge Instructions (Signed)
EGD Discharge instructions Please read the instructions outlined below and refer to this sheet in the next few weeks. These discharge instructions provide you with general information on caring for yourself after you leave the hospital. Your doctor may also give you specific instructions. While your treatment has been planned according to the most current medical practices available, unavoidable complications occasionally occur. If you have any problems or questions after discharge, please call your doctor. ACTIVITY You may resume your regular activity but move at a slower pace for the next 24 hours.  Take frequent rest periods for the next 24 hours.  Walking will help expel (get rid of) the air and reduce the bloated feeling in your abdomen.  No driving for 24 hours (because of the anesthesia (medicine) used during the test).  You may shower.  Do not sign any important legal documents or operate any machinery for 24 hours (because of the anesthesia used during the test).  NUTRITION Drink plenty of fluids.  You may resume your normal diet.  Begin with a light meal and progress to your normal diet.  Avoid alcoholic beverages for 24 hours or as instructed by your caregiver.  MEDICATIONS You may resume your normal medications unless your caregiver tells you otherwise.  WHAT YOU CAN EXPECT TODAY You may experience abdominal discomfort such as a feeling of fullness or "gas" pains.  FOLLOW-UP Your doctor will discuss the results of your test with you.  SEEK IMMEDIATE MEDICAL ATTENTION IF ANY OF THE FOLLOWING OCCUR: Excessive nausea (feeling sick to your stomach) and/or vomiting.  Severe abdominal pain and distention (swelling).  Trouble swallowing.  Temperature over 101 F (37.8 C).  Rectal bleeding or vomiting of blood.    Colonoscopy Discharge Instructions  Read the instructions outlined below and refer to this sheet in the next few weeks. These discharge instructions provide you with  general information on caring for yourself after you leave the hospital. Your doctor may also give you specific instructions. While your treatment has been planned according to the most current medical practices available, unavoidable complications occasionally occur. If you have any problems or questions after discharge, call Dr. Gala Romney at [phone]. ACTIVITY You may resume your regular activity, but move at a slower pace for the next 24 hours.  Take frequent rest periods for the next 24 hours.  Walking will help get rid of the air and reduce the bloated feeling in your belly (abdomen).  No driving for 24 hours (because of the medicine (anesthesia) used during the test).   Do not sign any important legal documents or operate any machinery for 24 hours (because of the anesthesia used during the test).  NUTRITION Drink plenty of fluids.  You may resume your normal diet as instructed by your doctor.  Begin with a light meal and progress to your normal diet. Heavy or fried foods are harder to digest and may make you feel sick to your stomach (nauseated).  Avoid alcoholic beverages for 24 hours or as instructed.  MEDICATIONS You may resume your normal medications unless your doctor tells you otherwise.  WHAT YOU CAN EXPECT TODAY Some feelings of bloating in the abdomen.  Passage of more gas than usual.  Spotting of blood in your stool or on the toilet paper.  IF YOU HAD POLYPS REMOVED DURING THE COLONOSCOPY: No aspirin products for 7 days or as instructed.  No alcohol for 7 days or as instructed.  Eat a soft diet for the next 24 hours.  FINDING  OUT THE RESULTS OF YOUR TEST Not all test results are available during your visit. If your test results are not back during the visit, make an appointment with your caregiver to find out the results. Do not assume everything is normal if you have not heard from your caregiver or the medical facility. It is important for you to follow up on all of your test  results.  SEEK IMMEDIATE MEDICAL ATTENTION IF: You have more than a spotting of blood in your stool.  Your belly is swollen (abdominal distention).  You are nauseated or vomiting.  You have a temperature over 101.  You have abdominal pain or discomfort that is severe or gets worse throughout the day.     Varicose veins had plumped up a bit.  4 bands placed  2 polyps removed from your colon and 1 clip placed  Diverticulosis and colon polyp information provided   continue Inderal.    If you experience any mild sore throat or chest discomfort use Chloraseptic and/or acetaminophen/Tylenol elixir (not to exceed 2 g of Tylenol in a 24-hour period)   office visit with [name] in 3 months   no MRI until clip gone.    At patient request, I called [facility] at [phone] findings and recommendations

## 2022-04-01 NOTE — Transfer of Care (Signed)
Immediate Anesthesia Transfer of Care Note  Patient: Alisha [name]  Procedure(s) Performed: COLONOSCOPY WITH PROPOFOL ESOPHAGOGASTRODUODENOSCOPY (EGD) WITH PROPOFOL ESOPHAGEAL BANDING  Patient Location: PACU  Anesthesia Type:General  Level of Consciousness: awake, alert  and oriented  Airway & Oxygen Therapy: Patient Spontanous Breathing  Post-op Assessment: Report given to RN, Post -op Vital signs reviewed and stable, Patient moving all extremities X 4 and Patient able to stick tongue midline  Post vital signs: Reviewed  Last Vitals:  Vitals Value Taken Time  BP 119/55   Temp 36.7   Pulse 83   Resp 21   SpO2 95     Last Pain:  Vitals:   04/01/22 0707  PainSc: 0-No pain         Complications: No notable events documented.

## 2022-04-01 NOTE — Op Note (Signed)
[facility] Patient Name: Alisha [name] Procedure Date: 04/01/2022 7:59 AM MRN: [id number] Date of Birth: Nov 17, 1950 Attending MD: Norvel Richards , MD CSN: [id number] Age: 71 Admit Type: Outpatient Procedure:                Colonoscopy Indications:              Iron deficiency anemia Providers:                Norvel Richards, MD, Lurline Del, RN, Everardo Pacific, Aram Candela Referring MD:              Medicines:                Propofol per Anesthesia Complications:            No immediate complications. Estimated Blood Loss:     Estimated blood loss was minimal. Procedure:                Pre-Anesthesia Assessment:                           - Prior to the procedure, a History and Physical                            was performed, and patient medications and                            allergies were reviewed. The patient's tolerance of                            previous anesthesia was also reviewed. The risks                            and benefits of the procedure and the sedation                            options and risks were discussed with the patient.                            All questions were answered, and informed consent                            was obtained. Prior Anticoagulants: The patient has                            taken no previous anticoagulant or antiplatelet                            agents. ASA Grade Assessment: III - A patient with                            severe systemic disease. After reviewing the risks  and benefits, the patient was deemed in                            satisfactory condition to undergo the procedure.                           After obtaining informed consent, the colonoscope                            was passed under direct vision. Throughout the                            procedure, the patient's blood pressure, pulse, and                            oxygen  saturations were monitored continuously. The                            [id number]-HQ190L ([id number]) scope was introduced through                            the anus and advanced to the the cecum, identified                            by appendiceal orifice and ileocecal valve. The                            colonoscopy was performed without difficulty. The                            patient tolerated the procedure well. The quality                            of the bowel preparation was adequate. Scope In: Scope Out: Findings:      Hemorrhoids were found on perianal exam.      Two semi-pedunculated polyps were found in the descending colon and       ascending colon. The polyps were 4 to 8 mm in size. These polyps were       removed with a cold snare. Resection and retrieval were complete.       Estimated blood loss was minimal.      Scattered medium-mouthed diverticula were found in the sigmoid colon and       descending colon. The larger polypectomy site in the descending segment       wanted to ooze a bit. 1 resolution clip placed for hemostasis      The exam was otherwise without abnormality on direct and retroflexion       views. Impression:               - Hemorrhoids found on perianal exam.                           - Two 4 to 8 mm polyps in the descending colon and  in the ascending colon, removed with a cold snare.                            Resected and retrieved. Hemostasis clip x1.                           - Diverticulosis in the sigmoid colon and in the                            descending colon.                           - The examination was otherwise normal on direct                            and retroflexion views. Moderate Sedation:      Moderate (conscious) sedation was personally administered by an       anesthesia professional. The following parameters were monitored: oxygen       saturation, heart rate, blood pressure, respiratory rate, EKG,  adequacy       of pulmonary ventilation, and response to care. Recommendation:           - Patient has a contact number available for                            emergencies. The signs and symptoms of potential                            delayed complications were discussed with the                            patient. Return to normal activities tomorrow.                            Written discharge instructions were provided to the                            patient.                           - Advance diet as tolerated.                           - Continue present medications.                           - Repeat colonoscopy date to be determined after                            pending pathology results are reviewed for                            surveillance.                           - Return to GI office in 3 months. No  MRI until                            clip gone. See EGD report. Procedure Code(s):        --- Professional ---                           [phone], Colonoscopy, flexible; with removal of                            tumor(s), polyp(s), or other lesion(s) by snare                            technique Diagnosis Code(s):        --- Professional ---                           K63.5, Polyp of colon                           K64.9, Unspecified hemorrhoids                           D50.9, Iron deficiency anemia, unspecified                           K57.30, Diverticulosis of large intestine without                            perforation or abscess without bleeding CPT copyright 2019 American Medical Association. All rights reserved. The codes documented in this report are preliminary and upon coder review may  be revised to meet current compliance requirements. Cristopher Estimable. Tillie Viverette, MD Norvel Richards, MD 04/01/2022 9:21:20 AM This report has been signed electronically. Number of Addenda: 0

## 2022-04-01 NOTE — Anesthesia Postprocedure Evaluation (Signed)
Anesthesia Post Note  Patient: AKIBA MELFI  Procedure(s) Performed: COLONOSCOPY WITH PROPOFOL ESOPHAGOGASTRODUODENOSCOPY (EGD) WITH PROPOFOL ESOPHAGEAL BANDING  Patient location during evaluation: Phase II Anesthesia Type: General Level of consciousness: awake and alert and oriented Pain management: pain level controlled Vital Signs Assessment: post-procedure vital signs reviewed and stable Respiratory status: spontaneous breathing, nonlabored ventilation and respiratory function stable Cardiovascular status: blood pressure returned to baseline and stable Postop Assessment: no apparent nausea or vomiting Anesthetic complications: no   No notable events documented.   Last Vitals:  Vitals:   04/01/22 0731 04/01/22 0910  BP: (!) 119/57 (!) 119/55  Pulse: 64 83  Resp: 17 18  Temp: 36.8 C 36.7 C  SpO2: 99% 95%    Last Pain:  Vitals:   04/01/22 0910  TempSrc: Oral  PainSc: 0-No pain                 Nakema Fake C Leatha Rohner

## 2022-04-01 NOTE — Anesthesia Preprocedure Evaluation (Signed)
Anesthesia Evaluation  Patient identified by MRN, date of birth, ID band Patient awake    Reviewed: Allergy & Precautions, NPO status , Patient's Chart, lab work & pertinent test results, reviewed documented beta blocker date and time   Airway Mallampati: II  TM Distance: >3 FB Neck ROM: Full    Dental  (+) Dental Advisory Given No notable dental injury:   Pulmonary neg pulmonary ROS,    Pulmonary exam normal breath sounds clear to auscultation       Cardiovascular hypertension, Pt. on medications and Pt. on home beta blockers  Rhythm:Regular Rate:Normal     Neuro/Psych  Neuromuscular disease negative psych ROS   GI/Hepatic hiatal hernia, GERD  Medicated and Controlled,(+) Cirrhosis   Esophageal Varices    , Hepatitis -  Endo/Other  diabetes, Well Controlled, Type 2, Insulin Dependent  Renal/GU negative Renal ROS  negative genitourinary   Musculoskeletal negative musculoskeletal ROS (+)   Abdominal   Peds negative pediatric ROS (+)  Hematology  (+) Blood dyscrasia (thrombocytopenia platelets - 52), anemia ,   Anesthesia Other Findings   Reproductive/Obstetrics negative OB ROS                             Anesthesia Physical Anesthesia Plan  ASA: 3  Anesthesia Plan: General   Post-op Pain Management: Minimal or no pain anticipated   Induction: Intravenous  PONV Risk Score and Plan: Propofol infusion  Airway Management Planned: Nasal Cannula and Natural Airway  Additional Equipment:   Intra-op Plan:   Post-operative Plan:   Informed Consent: I have reviewed the patients History and Physical, chart, labs and discussed the procedure including the risks, benefits and alternatives for the proposed anesthesia with the patient or authorized representative who has indicated his/her understanding and acceptance.     Dental advisory given  Plan Discussed with: CRNA and  Surgeon  Anesthesia Plan Comments:         Anesthesia Quick Evaluation

## 2022-04-02 ENCOUNTER — Encounter: Payer: Self-pay | Admitting: Internal Medicine

## 2022-04-02 DIAGNOSIS — H43813 Vitreous degeneration, bilateral: Secondary | ICD-10-CM | POA: Diagnosis not present

## 2022-04-02 DIAGNOSIS — E1122 Type 2 diabetes mellitus with diabetic chronic kidney disease: Secondary | ICD-10-CM | POA: Diagnosis not present

## 2022-04-02 DIAGNOSIS — D631 Anemia in chronic kidney disease: Secondary | ICD-10-CM | POA: Diagnosis not present

## 2022-04-02 DIAGNOSIS — D696 Thrombocytopenia, unspecified: Secondary | ICD-10-CM | POA: Diagnosis not present

## 2022-04-02 DIAGNOSIS — I129 Hypertensive chronic kidney disease with stage 1 through stage 4 chronic kidney disease, or unspecified chronic kidney disease: Secondary | ICD-10-CM | POA: Diagnosis not present

## 2022-04-02 DIAGNOSIS — N189 Chronic kidney disease, unspecified: Secondary | ICD-10-CM | POA: Diagnosis not present

## 2022-04-02 LAB — HM DIABETES EYE EXAM

## 2022-04-02 LAB — SURGICAL PATHOLOGY

## 2022-04-03 DIAGNOSIS — Z1331 Encounter for screening for depression: Secondary | ICD-10-CM | POA: Diagnosis not present

## 2022-04-03 DIAGNOSIS — Z6834 Body mass index (BMI) 34.0-34.9, adult: Secondary | ICD-10-CM | POA: Diagnosis not present

## 2022-04-03 DIAGNOSIS — D696 Thrombocytopenia, unspecified: Secondary | ICD-10-CM | POA: Diagnosis not present

## 2022-04-03 DIAGNOSIS — Z299 Encounter for prophylactic measures, unspecified: Secondary | ICD-10-CM | POA: Diagnosis not present

## 2022-04-03 DIAGNOSIS — Z0000 Encounter for general adult medical examination without abnormal findings: Secondary | ICD-10-CM | POA: Diagnosis not present

## 2022-04-03 DIAGNOSIS — E6609 Other obesity due to excess calories: Secondary | ICD-10-CM | POA: Diagnosis not present

## 2022-04-03 DIAGNOSIS — E7800 Pure hypercholesterolemia, unspecified: Secondary | ICD-10-CM | POA: Diagnosis not present

## 2022-04-03 DIAGNOSIS — R5383 Other fatigue: Secondary | ICD-10-CM | POA: Diagnosis not present

## 2022-04-03 DIAGNOSIS — I10 Essential (primary) hypertension: Secondary | ICD-10-CM | POA: Diagnosis not present

## 2022-04-03 DIAGNOSIS — Z1339 Encounter for screening examination for other mental health and behavioral disorders: Secondary | ICD-10-CM | POA: Diagnosis not present

## 2022-04-03 DIAGNOSIS — Z7189 Other specified counseling: Secondary | ICD-10-CM | POA: Diagnosis not present

## 2022-04-07 ENCOUNTER — Encounter (HOSPITAL_COMMUNITY): Payer: Self-pay | Admitting: Internal Medicine

## 2022-04-07 DIAGNOSIS — E1165 Type 2 diabetes mellitus with hyperglycemia: Secondary | ICD-10-CM | POA: Diagnosis not present

## 2022-04-08 DIAGNOSIS — Z299 Encounter for prophylactic measures, unspecified: Secondary | ICD-10-CM | POA: Diagnosis not present

## 2022-04-08 DIAGNOSIS — Z6834 Body mass index (BMI) 34.0-34.9, adult: Secondary | ICD-10-CM | POA: Diagnosis not present

## 2022-04-08 DIAGNOSIS — Z789 Other specified health status: Secondary | ICD-10-CM | POA: Diagnosis not present

## 2022-04-08 DIAGNOSIS — E6609 Other obesity due to excess calories: Secondary | ICD-10-CM | POA: Diagnosis not present

## 2022-04-08 DIAGNOSIS — I10 Essential (primary) hypertension: Secondary | ICD-10-CM | POA: Diagnosis not present

## 2022-04-09 ENCOUNTER — Ambulatory Visit: Payer: Medicare PPO | Admitting: "Endocrinology

## 2022-04-14 ENCOUNTER — Encounter: Payer: Self-pay | Admitting: "Endocrinology

## 2022-04-14 ENCOUNTER — Ambulatory Visit: Payer: Medicare PPO | Admitting: "Endocrinology

## 2022-04-14 VITALS — BP 114/58 | HR 72 | Ht 60.0 in | Wt 190.8 lb

## 2022-04-14 DIAGNOSIS — I10 Essential (primary) hypertension: Secondary | ICD-10-CM | POA: Diagnosis not present

## 2022-04-14 DIAGNOSIS — Z794 Long term (current) use of insulin: Secondary | ICD-10-CM

## 2022-04-14 DIAGNOSIS — Z789 Other specified health status: Secondary | ICD-10-CM | POA: Diagnosis not present

## 2022-04-14 DIAGNOSIS — E1169 Type 2 diabetes mellitus with other specified complication: Secondary | ICD-10-CM

## 2022-04-14 DIAGNOSIS — E782 Mixed hyperlipidemia: Secondary | ICD-10-CM | POA: Diagnosis not present

## 2022-04-14 DIAGNOSIS — E559 Vitamin D deficiency, unspecified: Secondary | ICD-10-CM | POA: Diagnosis not present

## 2022-04-14 MED ORDER — TRESIBA FLEXTOUCH 200 UNIT/ML ~~LOC~~ SOPN
100.0000 [IU] | PEN_INJECTOR | Freq: Every day | SUBCUTANEOUS | 1 refills | Status: DC
Start: 2022-04-14 — End: 2022-04-20

## 2022-04-14 MED ORDER — OZEMPIC (2 MG/DOSE) 8 MG/3ML ~~LOC~~ SOPN: 2.0000 mg | PEN_INJECTOR | SUBCUTANEOUS | 2 refills | Status: DC

## 2022-04-14 MED ORDER — NOVOLOG FLEXPEN 100 UNIT/ML ~~LOC~~ SOPN: 14.0000 [IU] | PEN_INJECTOR | Freq: Three times a day (TID) | SUBCUTANEOUS | 2 refills | Status: DC

## 2022-04-14 NOTE — Patient Instructions (Signed)
                                     Advice for Weight Management  -For most of us the best way to lose weight is by diet management. Generally speaking, diet management means consuming less calories intentionally which over time brings about progressive weight loss.  This can be achieved more effectively by avoiding ultra processed carbohydrates, processed meats, unhealthy fats.    It is critically important to know your numbers: how much calorie you are consuming and how much calorie you need. More importantly, our carbohydrates sources should be unprocessed naturally occurring  complex starch food items.  It is always important to balance nutrition also by  appropriate intake of proteins (mainly plant-based), healthy fats/oils, plenty of fruits and vegetables.   -The American College of Lifestyle Medicine (ACL M) recommends nutrition derived mostly from Whole Food, Plant Predominant Sources example an apple instead of applesauce or apple pie. Eat Plenty of vegetables, Mushrooms, fruits, Legumes, Whole Grains, Nuts, seeds in lieu of processed meats, processed snacks/pastries red meat, poultry, eggs.  Use only water or unsweetened tea for hydration.  The College also recommends the need to stay away from risky substances including alcohol, smoking; obtaining 7-9 hours of restorative sleep, at least 150 minutes of moderate intensity exercise weekly, importance of healthy social connections, and being mindful of stress and seek help when it is overwhelming.    -Sticking to a routine mealtime to eat 3 meals a day and avoiding unnecessary snacks is shown to have a big role in weight control. Under normal circumstances, the only time we burn stored energy is when we are hungry, so allow  some hunger to take place- hunger means no food between appropriate meal times, only water.  It is not advisable to starve.   -It is better to avoid simple carbohydrates including:  Cakes, Sweet Desserts, Ice Cream, Soda (diet and regular), Sweet Tea, Candies, Chips, Cookies, Store Bought Juices, Alcohol in Excess of  1-2 drinks a day, Lemonade,  Artificial Sweeteners, Doughnuts, Coffee Creamers, "Sugar-free" Products, etc, etc.  This is not a complete list.....    -Consulting with certified diabetes educators is proven to provide you with the most accurate and current information on diet.  Also, you may be  interested in discussing diet options/exchanges , we can schedule a visit with Alisha [name], RDN, CDE for individualized nutrition education.  -Exercise: If you are able: 30 -60 minutes a day ,4 days a week, or 150 minutes of moderate intensity exercise weekly.    The longer the better if tolerated.  Combine stretch, strength, and aerobic activities.  If you were told in the past that you have high risk for cardiovascular diseases, or if you are currently symptomatic, you may seek evaluation by your heart doctor prior to initiating moderate to intense exercise programs.                                  Additional Care Considerations for Diabetes/Prediabetes   -Diabetes  is a chronic disease.  The most important care consideration is regular follow-up with your diabetes care provider with the goal being avoiding or delaying its complications and to take advantage of advances in medications and technology.  If appropriate actions are taken early enough, type 2 diabetes can even be   reversed.  Seek information from the right source.  - Whole Food, Plant Predominant Nutrition is highly recommended: Eat Plenty of vegetables, Mushrooms, fruits, Legumes, Whole Grains, Nuts, seeds in lieu of processed meats, processed snacks/pastries red meat, poultry, eggs as recommended by American College of  Lifestyle Medicine (ACLM).  -Type 2 diabetes is known to coexist with other important comorbidities such as high blood pressure and high cholesterol.  It is critical to control not only the  diabetes but also the high blood pressure and high cholesterol to minimize and delay the risk of complications including coronary artery disease, stroke, amputations, blindness, etc.  The good news is that this diet recommendation for type 2 diabetes is also very helpful for managing high cholesterol and high blood blood pressure.  - Studies showed that people with diabetes will benefit from a class of medications known as ACE inhibitors and statins.  Unless there are specific reasons not to be on these medications, the standard of care is to consider getting one from these groups of medications at an optimal doses.  These medications are generally considered safe and proven to help protect the heart and the kidneys.    - People with diabetes are encouraged to initiate and maintain regular follow-up with eye doctors, foot doctors, dentists , and if necessary heart and kidney doctors.     - It is highly recommended that people with diabetes quit smoking or stay away from smoking, and get yearly  flu vaccine and pneumonia vaccine at least every 5 years.  See above for additional recommendations on exercise, sleep, stress management , and healthy social connections.      

## 2022-04-14 NOTE — Progress Notes (Signed)
04/14/2022, 6:14 PM  Endocrinology follow-up note   Subjective:    Patient ID: Alisha [name], female    DOB: 1950/11/06.  Alisha [name] is being seen in follow-up after she was seen in consultation for management of currently uncontrolled symptomatic diabetes requested by  Glenda Chroman, MD.   Past Medical History:  Diagnosis Date   B12 deficiency 11/03/2016   B12 deficiency 11/03/2016   Barrett's esophagus    Cirrhosis of liver without mention of alcohol    hep B surface antigen and HCV ab negative.pt has not had hepatitis A and B vaccines.U/S on 07/04/13 shows cirrhosis.   Diverticula of colon    pancolonic   Esophageal reflux    Esophageal varices (HCC)    Hiatal hernia    Iron deficiency anemia, unspecified    Other and unspecified hyperlipidemia    Portal hypertensive gastropathy (HCC)    Tubular adenoma    Type II or unspecified type diabetes mellitus without mention of complication, not stated as uncontrolled    Unspecified essential hypertension    Unspecified hemorrhoids without mention of complication     Past Surgical History:  Procedure Laterality Date   ABDOMINAL HYSTERECTOMY     BIOPSY  04/30/2016   Procedure: BIOPSY;  Surgeon: Daneil Dolin, MD;  [facility];  Service: Endoscopy;;  esophagus   BIOPSY  02/24/2019   Procedure: BIOPSY;  Surgeon: Daneil Dolin, MD;  [facility];  Service: Endoscopy;;   CESAREAN SECTION     x 2   COLONOSCOPY  03/2006   left sided diverticula, splenic flexure tublar adenoma   COLONOSCOPY  07/2002   villous tubular adenoma in rectum   COLONOSCOPY  09/23/09   external hemorrhoids/scattered pan colonic diverticula otherwise normal. cecal lipoma bx negative, normal TI. Next TCS 09/2014   COLONOSCOPY N/A 11/12/2014   Procedure: COLONOSCOPY;  Surgeon: Daneil Dolin, MD;  [facility];  Service: Endoscopy;   Laterality: N/A;  1215 - moved to 12:30 - Ginger to notify pt   COLONOSCOPY N/A 01/07/2017   Rourk: Grade 4 internal hemorrhoids, 5 mm ulcer in the sigmoid colon (benign bx), 7 mm polyp removed from the cecum (tubular adenoma), diverticulosis.next tcs in five years   COLONOSCOPY WITH PROPOFOL N/A 04/01/2022   Procedure: COLONOSCOPY WITH PROPOFOL;  Surgeon: Daneil Dolin, MD;  [facility];  Service: Endoscopy;  Laterality: N/A;  8:00am   ESOPHAGEAL BANDING N/A 12/20/2016   Procedure: ESOPHAGEAL BANDING;  Surgeon: Danie Binder, MD;  [facility];  Service: Endoscopy;  Laterality: N/A;   ESOPHAGEAL BANDING N/A 03/18/2017   Procedure: ESOPHAGEAL BANDING;  Surgeon: Daneil Dolin, MD;  [facility];  Service: Endoscopy;  Laterality: N/A;   ESOPHAGEAL BANDING  12/17/2017   Procedure: ESOPHAGEAL BANDING;  Surgeon: Daneil Dolin, MD;  [facility];  Service: Endoscopy;;   ESOPHAGEAL BANDING N/A 07/03/2020   Procedure: ESOPHAGEAL BANDING;  Surgeon: Daneil Dolin, MD;  [facility];  Service: Endoscopy;  Laterality: N/A;   ESOPHAGEAL BANDING  04/01/2022   Procedure: ESOPHAGEAL BANDING;  Surgeon:  Daneil Dolin, MD;  [facility];  Service: Endoscopy;;   ESOPHAGOGASTRODUODENOSCOPY  08/2006   barretts esophagus, no dyplasia   ESOPHAGOGASTRODUODENOSCOPY  03/2006   barretts esophagus,bx focal atypia c/w low grade dysplasia, SB bx negative for celiac   ESOPHAGOGASTRODUODENOSCOPY  09/23/09   3 columns of grade 2 esophageal varices/hiatal hernia/3-4 cm segment Barrett's esophagus without dysplasia. Next EGD 09/2012   ESOPHAGOGASTRODUODENOSCOPY N/A 11/11/2012   Dr. Gala Romney- Barrett;s esophagus, esophageal varices, portal gastopathy. hiatal hernia   ESOPHAGOGASTRODUODENOSCOPY N/A 04/30/2016   Procedure: ESOPHAGOGASTRODUODENOSCOPY (EGD);  Surgeon: Daneil Dolin, MD;  [facility];  Service: Endoscopy;  Laterality: N/A;  815    ESOPHAGOGASTRODUODENOSCOPY N/A 12/20/2016   Procedure: ESOPHAGOGASTRODUODENOSCOPY (EGD);  Surgeon: Danie Binder, MD;  [facility];  Service: Endoscopy;  Laterality: N/A;   ESOPHAGOGASTRODUODENOSCOPY N/A 01/07/2017   Procedure: ESOPHAGOGASTRODUODENOSCOPY (EGD);  Surgeon: Daneil Dolin, MD;  [facility];  Service: Endoscopy;  Laterality: N/A;   ESOPHAGOGASTRODUODENOSCOPY N/A 03/18/2017   Procedure: ESOPHAGOGASTRODUODENOSCOPY (EGD);  Surgeon: Daneil Dolin, MD;  [facility];  Service: Endoscopy;  Laterality: N/A;  1030    ESOPHAGOGASTRODUODENOSCOPY N/A 12/17/2017   Procedure: ESOPHAGOGASTRODUODENOSCOPY (EGD);  Surgeon: Daneil Dolin, MD;  [facility];  Service: Endoscopy;  Laterality: N/A;  10:30am   ESOPHAGOGASTRODUODENOSCOPY N/A 02/24/2019   Rourk: No esophageal varices seen, scars noted from prior banding.  5 cm segment of salmon-colored epithelium coming up from the GE junction consistent with Barrett's (bx with no dysplasia).  Portal gastropathy.  Small hiatal hernia.  Recommended repeat EGD in 18 months.   ESOPHAGOGASTRODUODENOSCOPY N/A 07/03/2020   Procedure: ESOPHAGOGASTRODUODENOSCOPY (EGD);  Surgeon: Daneil Dolin, MD;  [facility];  Service: Endoscopy;  Laterality: N/A;  10:15am   ESOPHAGOGASTRODUODENOSCOPY (EGD) WITH PROPOFOL N/A 04/01/2022   Procedure: ESOPHAGOGASTRODUODENOSCOPY (EGD) WITH PROPOFOL;  Surgeon: Daneil Dolin, MD;  [facility];  Service: Endoscopy;  Laterality: N/A;   [place]  2008   POLYPECTOMY  04/01/2022   Procedure: POLYPECTOMY;  Surgeon: Daneil Dolin, MD;  [facility];  Service: Endoscopy;;  ascending and descending   small bowel capsule endoscopy  10/2009   normal    Social History   Socioeconomic History   Marital status: Married    Spouse name: Not on file   Number of children: Not on file   Years of education: Not on file   Highest education level: Not on  file  Occupational History   Not on file  Tobacco Use   Smoking status: Never   Smokeless tobacco: Never  Vaping Use   Vaping Use: Never used  Substance and Sexual Activity   Alcohol use: No   Drug use: No   Sexual activity: Yes  Other Topics Concern   Not on file  Social History Narrative   Not on file   Social Determinants of Health   Financial Resource Strain: Not on file  Food Insecurity: Not on file  Transportation Needs: Not on file  Physical Activity: Not on file  Stress: Not on file  Social Connections: Not on file    Family History  Problem Relation Age of Onset   Alzheimer's disease Mother    CAD Father    Diabetes Mellitus II Father    Alzheimer's disease Father    Diabetes Mellitus II Sister    Colon cancer Sister 13       small, surgical excision; chemo/rad not  needed   Cancer - Other Brother    Diabetes Mellitus II Brother    Hypertension Brother    Liver disease Neg Hx     Outpatient Encounter Medications as of 04/14/2022  Medication Sig   Semaglutide, 2 MG/DOSE, (OZEMPIC, 2 MG/DOSE,) 8 MG/3ML SOPN Inject 2 mg into the skin once a week.   Accu-Chek Softclix Lancets lancets USE TO CHECK BLOOD SUGAR FOUR TIMES DAILY. Dx: E11.65   Cholecalciferol (VITAMIN D) 2000 units tablet Take 2,000 Units by mouth daily.   Continuous Blood Gluc Receiver (FREESTYLE LIBRE 2 READER) DEVI As directed   Continuous Blood Gluc Sensor (FREESTYLE LIBRE 2 SENSOR) MISC 1 Piece by Does not apply route every 14 (fourteen) days.   insulin aspart (NOVOLOG FLEXPEN) 100 UNIT/ML FlexPen Inject 14-20 Units into the skin 3 (three) times daily before meals.   insulin degludec (TRESIBA FLEXTOUCH) 200 UNIT/ML FlexTouch Pen Inject 100 Units into the skin at bedtime.   lisinopril (PRINIVIL,ZESTRIL) 10 MG tablet Take 10 mg by mouth daily.   NOVOFINE 32G X 6 MM MISC USE ONE AS DIRECTED TWICE DAILY.   pantoprazole (PROTONIX) 40 MG tablet Take 1 tablet (40 mg total) by mouth 2 (two) times daily  before a meal.   propranolol (INDERAL) 10 MG tablet TAKE THREE (3) TABLETS BY MOUTH EVERY MORNING AND 3 TABLETS EVERY EVENING   [DISCONTINUED] insulin aspart (NOVOLOG FLEXPEN) 100 UNIT/ML FlexPen Inject 12-18 Units into the skin 3 (three) times daily before meals.   [DISCONTINUED] insulin degludec (TRESIBA FLEXTOUCH) 200 UNIT/ML FlexTouch Pen Inject 90 Units into the skin at bedtime.   [DISCONTINUED] polyethylene glycol-electrolytes (NULYTELY) 420 g solution As directed   [DISCONTINUED] VICTOZA 18 MG/3ML SOLN Inject 1.8 mg into the skin daily.   No facility-administered encounter medications on file as of 04/14/2022.    ALLERGIES: Allergies  Allergen Reactions   Codeine Nausea And Vomiting    VACCINATION STATUS:  There is no immunization history on file for this patient.  Diabetes She presents for her follow-up diabetic visit. She has type 2 diabetes mellitus. Onset time: She was diagnosed at approximate age of 4 years. Her disease course has been improving. There are no hypoglycemic associated symptoms. Pertinent negatives for hypoglycemia include no confusion, headaches, pallor or seizures. Pertinent negatives for diabetes include no chest pain, no fatigue, no polydipsia, no polyphagia and no polyuria. (She did not bring any logs nor meter to review.  Patient verbally reports that she is having fluctuating glycemic profile including hypoglycemia.) There are no hypoglycemic complications. Symptoms are worsening. Diabetic complications include nephropathy. Risk factors for coronary artery disease include diabetes mellitus, hypertension, obesity, post-menopausal and family history. Her weight is fluctuating minimally. She is following a generally unhealthy diet. When asked about meal planning, she reported none. She has had a previous visit with a dietitian. She participates in exercise intermittently. Her home blood glucose trend is decreasing steadily. Her breakfast blood glucose range is  generally >200 mg/dl. Her lunch blood glucose range is generally 180-200 mg/dl. Her dinner blood glucose range is generally >200 mg/dl. Her bedtime blood glucose range is generally >200 mg/dl. Her overall blood glucose range is >200 mg/dl. Alisha [name] presents with significantly better glycemic profile.  Her CGM was analyzed and downloaded.  Her AGP report shows 31% time in range improving from 8% during her last visit, 66% above range, improving from 92% during her last visit.  She did not document any hypoglycemia.   Over the last 14 days, her average blood  glucose is 203.  Her point-of-care A1c is 9%, improving.) An ACE inhibitor/angiotensin II receptor blocker is being taken. Eye exam is current.  Hypertension This is a chronic problem. The current episode started more than 1 year ago. The problem is controlled. Pertinent negatives include no chest pain, headaches, palpitations or shortness of breath. Risk factors for coronary artery disease include diabetes mellitus, family history, obesity, dyslipidemia, sedentary lifestyle and post-menopausal state. Past treatments include ACE inhibitors. Hypertensive end-organ damage includes kidney disease. Identifiable causes of hypertension include chronic renal disease.     Review of Systems  Constitutional:  Negative for chills, fatigue, fever and unexpected weight change.  HENT:  Negative for trouble swallowing and voice change.   Eyes:  Negative for visual disturbance.  Respiratory:  Negative for cough, shortness of breath and wheezing.   Cardiovascular:  Negative for chest pain, palpitations and leg swelling.  Gastrointestinal:  Negative for diarrhea, nausea and vomiting.  Endocrine: Negative for cold intolerance, heat intolerance, polydipsia, polyphagia and polyuria.  Musculoskeletal:  Negative for arthralgias and myalgias.  Skin:  Negative for color change, pallor, rash and wound.  Neurological:  Negative for seizures and headaches.   Psychiatric/Behavioral:  Negative for confusion and suicidal ideas.     Objective:       04/14/2022   11:09 AM 04/01/2022    9:10 AM 04/01/2022    7:31 AM  Vitals with BMI  Height 5' 0"     Weight 190 lbs 13 oz    BMI 98.92    Systolic 119 417 408  Diastolic 58 55 57  Pulse 72 83 64    BP (!) 114/58   Pulse 72   Ht 5' (1.524 m)   Wt 190 lb 12.8 oz (86.5 kg)   BMI 37.26 kg/m   Wt Readings from Last 3 Encounters:  04/14/22 190 lb 12.8 oz (86.5 kg)  03/31/22 187 lb 2.7 oz (84.9 kg)  03/26/22 191 lb 12.8 oz (87 kg)      CMP ( most recent) CMP     Component Value Date/Time   NA 137 03/26/2022 1518   K 4.1 03/26/2022 1518   CL 110 03/26/2022 1518   CO2 22 03/26/2022 1518   GLUCOSE 116 (H) 03/26/2022 1518   BUN 14 03/26/2022 1518   BUN 15 01/25/2020 0000   CREATININE 1.65 (H) 03/26/2022 1518   CREATININE 1.62 (H) 03/28/2018 1146   CALCIUM 8.7 (L) 03/26/2022 1518   CALCIUM 8.8 10/16/2021 1354   PROT 6.6 03/24/2022 1141   ALBUMIN 3.1 (L) 03/26/2022 1518   AST 33 03/24/2022 1141   ALT 19 03/24/2022 1141   ALKPHOS 101 03/24/2022 1141   BILITOT 2.9 (H) 03/24/2022 1141   GFRNONAA 33 (L) 03/26/2022 1518   GFRAA 36 (L) 05/29/2020 1416     Diabetic Labs (most recent): Lab Results  Component Value Date   HGBA1C 9.0 (A) 03/24/2022   HGBA1C 9.3 (A) 11/26/2021   HGBA1C 7.6 (A) 08/25/2021   MICROALBUR 30 03/24/2022   MICROALBUR 25.8 (H) 12/23/2021   MICROALBUR 9.6 (H) 06/20/2021     Lipid Panel ( most recent) Lipid Panel     Component Value Date/Time   CHOL 143 11/24/2021 1239   TRIG 98 11/24/2021 1239   HDL 41 11/24/2021 1239   CHOLHDL 3.5 11/24/2021 1239   VLDL 20 11/24/2021 1239   LDLCALC 82 11/24/2021 1239      Lab Results  Component Value Date   TSH 2.577 11/24/2021   TSH 2.531  12/20/2020   TSH 2.46 01/25/2020   FREET4 0.92 11/24/2021   FREET4 0.97 12/20/2020      Assessment & Plan:   1. Uncontrolled type 2 diabetes mellitus with stage 3  chronic kidney disease ([place])  - Alisha [name] has currently uncontrolled symptomatic type 2 DM since 71 years of age.  Alisha [name] presents with significantly better glycemic profile.  Her CGM was analyzed and downloaded.  Her AGP report shows 31% time in range improving from 8% during her last visit, 66% above range, improving from 92% during her last visit.  She did not document any hypoglycemia.   Over the last 14 days, her average blood glucose is 203.  Her point-of-care A1c is 9%, improving.    Recent labs reviewed. - I had a long discussion with her about the progressive nature of diabetes and the pathology behind its complications. -her diabetes is complicated by CKD and she remains at a high risk for more acute and chronic complications which include CAD, CVA, CKD, retinopathy, and neuropathy. These are all discussed in detail with her.  - I have counseled her on diet  and weight management  by adopting a carbohydrate restricted/protein rich diet. Patient is encouraged to switch to  unprocessed or minimally processed     complex starch and increased protein intake (animal or plant source), fruits, and vegetables. -  she is advised to stick to a routine mealtimes to eat 3 meals  a day and avoid unnecessary snacks ( to snack only to correct hypoglycemia).   Evidently, she is not utilizing her prescribed medication optimally.  She is scans  1-2 on average daily which will not allow her to utilize intensive treatment with basal/bolus insulin.   - she acknowledges that there is a room for improvement in her food and drink choices. - Suggestion is made for her to avoid simple carbohydrates  from her diet including Cakes, Sweet Desserts, Ice Cream, Soda (diet and regular), Sweet Tea, Candies, Chips, Cookies, Store Bought Juices, Alcohol , Artificial Sweeteners,  Coffee Creamer, and "Sugar-free" Products, Lemonade. This will help patient to have more stable blood glucose profile and potentially  avoid unintended weight gain.  The following Lifestyle Medicine recommendations according to [place]  [facility]) were discussed and and offered to patient and she  agrees to start the journey:  A. Whole Foods, Plant-Based Nutrition comprising of fruits and vegetables, plant-based proteins, whole-grain carbohydrates was discussed in detail with the patient.   A list for source of those nutrients were also provided to the patient.  Patient will use only water or unsweetened tea for hydration. B.  The need to stay away from risky substances including alcohol, smoking; obtaining 7 to 9 hours of restorative sleep, at least 150 minutes of moderate intensity exercise weekly, the importance of healthy social connections,  and stress management techniques were discussed. C.  A full color page of  Calorie density of various food groups per pound showing examples of each food groups was provided to the patient.   - I have approached her with the following individualized plan to manage  her diabetes and patient agrees:    -She presents with better engagement with significant improvement in her glycemic profile will continue to benefit from lifestyle medicine.     -She is advised to increase her Tresiba to 100 units nightly, increase NovoLog to 14    units  3 times daily AC for P[place] blood glucose readings above 90  mg per DL, associated with monitoring of blood glucose 4 times a day.   She is advised to use her CGM to monitor blood glucose continuously.   - she is warned not to take insulin without proper monitoring per orders. - she is encouraged to call clinic for blood glucose levels less than 70 or above 200 mg /dl. - she is advised to finish her current supplies of Victoza 1.8 mg subcutaneously daily, and switch to Ozempic 2 mg subcutaneously weekly.    - she is not a candidate for Metformin, due to concurrent renal insufficiency.  - Specific targets for  A1c;  LDL, HDL,   and Triglycerides were discussed with the patient.  2) Blood Pressure /Hypertension:  -Her blood pressure is controlled to target. she is advised to continue her current medications including lisinopril 10 mg p.o. daily with breakfast .   3) Lipids/Hyperlipidemia:   Review of her recent lipid panel showed  controlled uncontrolled LDL at 82 slightly improving from 91.     she  is not on statins.  She reports statin intolerance.  Whole food plant-based diet discussed and recommended above will help with dyslipidemia as well.      4)  Weight/Diet:  Body mass index is 37.26 kg/m.  -   clearly complicating her diabetes care.   she is  a candidate for weight loss. I discussed with her the fact that loss of 5 - 10% of her  current body weight will have the most impact on her diabetes management.  Exercise, and detailed carbohydrates information provided  -  detailed on discharge instructions.  5) Chronic Care/Health Maintenance:  -she  is on ACEI Statin medications and  is encouraged to initiate and continue to follow up with Ophthalmology, Dentist,  Podiatrist at least yearly or according to recommendations, and advised to   stay away from smoking. I have recommended yearly flu vaccine and pneumonia vaccine at least every 5 years; moderate intensity exercise for up to 150 minutes weekly; and  sleep for at least 7 hours a day.  6.  Vitamin D deficiency: She is advised to continue cholecalciferol 2000 units daily.   She recently had normal ABI, will be repeated in 5 years.  - she is  advised to maintain close follow up with Glenda Chroman, MD for primary care needs, as well as her other providers for optimal and coordinated care.   I spent 41 minutes in the care of the patient today including review of labs from [facility], Lipids, Thyroid Function, Hematology (current and previous including abstractions from other facilities); face-to-face time discussing  her blood glucose readings/logs, discussing  hypoglycemia and hyperglycemia episodes and symptoms, medications doses, her options of short and long term treatment based on the latest standards of care / guidelines;  discussion about incorporating lifestyle medicine;  and documenting the encounter. Risk reduction counseling performed per USPSTF guidelines to reduce obesity and cardiovascular risk factors.     Please refer to Patient Instructions for Blood Glucose Monitoring and Insulin/Medications Dosing Guide"  in media tab for additional information. Please  also refer to " Patient Self Inventory" in the Media  tab for reviewed elements of pertinent patient history.  Alisha [name] participated in the discussions, expressed understanding, and voiced agreement with the above plans.  All questions were answered to her satisfaction. she is encouraged to contact clinic should she have any questions or concerns prior to her return visit.    Follow up plan: -  Return in about 3 months (around 07/15/2022) for Bring Meter/CGM Device/Logs- A1c in Office.  Glade Lloyd, MD [facility] [address] [place], [place] [address] Phone: [phone]  Fax: [phone]    04/14/2022, 6:14 PM  This note was partially dictated with voice recognition software. Similar sounding words can be transcribed inadequately or may not  be corrected upon review.

## 2022-04-16 ENCOUNTER — Encounter: Payer: Self-pay | Admitting: *Deleted

## 2022-04-16 ENCOUNTER — Other Ambulatory Visit: Payer: Self-pay | Admitting: *Deleted

## 2022-04-16 NOTE — Patient Instructions (Signed)
Visit Information  Thank you for taking time to visit with me today. Please don't hesitate to contact me if I can be of assistance to you before our next scheduled telephone appointment.  Following are the goals we discussed today:  Start Ozempic as soon as available.  Monitor blood sugars several times a day, notify provider with hypo/hyperglycemic events.  Maintain well balanced diet.  Our next appointment is by telephone on 11/2   Patient verbalizes understanding of instructions and care plan provided today and agrees to view in [place]. Active MyChart status and patient understanding of how to access instructions and care plan via MyChart confirmed with patient.     The patient has been provided with contact information for the care management team and has been advised to call with any health related questions or concerns.   Valente David, RN, MSN, [facility] Care Coordinator [phone]

## 2022-04-16 NOTE — Patient Outreach (Addendum)
  Care Coordination   Initial Visit Note   04/16/2022 Name: Alisha [name] MRN: [id number] DOB: September 04, 1951  Alisha [name] is a 71 y.o. year old female who sees Vyas, Costella Hatcher, MD for primary care. I spoke with  Alisha [name] by phone today  What matters to the patients health and wellness today?  Keep all medical conditions managed, attending provider appointments, decrease A1C.   Ozempic ordered during visit on 8/1, waiting for delivery from pharmacy.  She has completed both her eye and foot exam for the year as well as mammogram and vaccinations (Covid, pneumonia).   Goals Addressed               This Visit's Progress     DM management, decrease A1C (pt-stated)        Care Coordination Interventions: Provided education to patient about basic DM disease process Reviewed medications with patient and discussed importance of medication adherence Reviewed scheduled/upcoming provider appointments including: Endocrinology on 11/1         SDOH assessments and interventions completed:  Yes     Care Coordination Interventions Activated:  Yes  Care Coordination Interventions:  Yes, provided   Follow up plan: Follow up call scheduled for within the next 3 months    Encounter Outcome:  Pt. Visit Completed   Valente David, RN, MSN, [place] Coordinator [phone]

## 2022-04-17 ENCOUNTER — Other Ambulatory Visit: Payer: Self-pay | Admitting: "Endocrinology

## 2022-05-08 DIAGNOSIS — E1165 Type 2 diabetes mellitus with hyperglycemia: Secondary | ICD-10-CM | POA: Diagnosis not present

## 2022-05-26 ENCOUNTER — Telehealth: Payer: Self-pay | Admitting: "Endocrinology

## 2022-05-26 ENCOUNTER — Other Ambulatory Visit: Payer: Self-pay | Admitting: "Endocrinology

## 2022-05-26 NOTE — Telephone Encounter (Signed)
F/u   Recheck blood sugar 100.   Patient ate peanut butter sandwich - no lunch

## 2022-05-26 NOTE — Telephone Encounter (Signed)
Left a message requesting pt return call to the office. ?

## 2022-05-26 NOTE — Telephone Encounter (Signed)
New message    The patient blood sugar this morning 47 - given apple juice / orange juice blood sugar is 70.   Has not eaten yet.

## 2022-05-27 NOTE — Telephone Encounter (Signed)
Tried to call pt but did not receive an answer and was unable to leave a message.

## 2022-05-27 NOTE — Telephone Encounter (Signed)
Pt states her glucose was 47 yesterday morning and in the 70s around lunch time. She had taken her tresiba 100 units the night before. She did not take any tresiba last night because she has ran out her glucose this a.m. was 127 and before lunch 137. Pt is prescribed to take tresiba 100 units qhs, humalog SS and ozempic 62m weekly.

## 2022-05-27 NOTE — Telephone Encounter (Signed)
F/u   Patient returning call back to the nurse.

## 2022-05-27 NOTE — Telephone Encounter (Signed)
Discussed with pt, understanding voiced. 

## 2022-06-01 DIAGNOSIS — K7581 Nonalcoholic steatohepatitis (NASH): Secondary | ICD-10-CM | POA: Diagnosis not present

## 2022-06-01 DIAGNOSIS — K7469 Other cirrhosis of liver: Secondary | ICD-10-CM | POA: Diagnosis not present

## 2022-06-01 DIAGNOSIS — C220 Liver cell carcinoma: Secondary | ICD-10-CM | POA: Diagnosis not present

## 2022-06-08 DIAGNOSIS — E1165 Type 2 diabetes mellitus with hyperglycemia: Secondary | ICD-10-CM | POA: Diagnosis not present

## 2022-07-06 ENCOUNTER — Other Ambulatory Visit: Payer: Self-pay | Admitting: "Endocrinology

## 2022-07-08 DIAGNOSIS — E1165 Type 2 diabetes mellitus with hyperglycemia: Secondary | ICD-10-CM | POA: Diagnosis not present

## 2022-07-15 ENCOUNTER — Ambulatory Visit: Payer: Medicare PPO | Admitting: "Endocrinology

## 2022-07-16 ENCOUNTER — Encounter: Payer: Self-pay | Admitting: *Deleted

## 2022-07-16 ENCOUNTER — Ambulatory Visit: Payer: Medicare PPO | Admitting: "Endocrinology

## 2022-07-17 ENCOUNTER — Encounter: Payer: Self-pay | Admitting: *Deleted

## 2022-07-17 ENCOUNTER — Ambulatory Visit: Payer: Self-pay | Admitting: *Deleted

## 2022-07-17 NOTE — Patient Outreach (Signed)
  Care Coordination   Follow Up Visit Note   07/17/2022 Name: ELDORA NAPP MRN: [id number] DOB: September 17, 1950  [name] is a 71 y.o. year old female who sees Vyas, Costella Hatcher, MD for primary care. I spoke with  [name] by phone today.  What matters to the patients health and wellness today? Managing hypoglycemia    Goals Addressed               This Visit's Progress     Patient Stated     DM Management: Hypoglycemia (pt-stated)        Care Coordination Interventions: Provided education to patient about basic DM disease process Counseled on importance of regular laboratory monitoring as prescribed Discussed plans with patient for ongoing care management follow up and provided patient with direct contact information for care management team Review of patient status, including review of consultants reports, relevant laboratory and other test results, and medications completed Assessed social determinant of health barriers Encouraged to reschedule missed office visit with Dr Dorris Fetch, endocrinologist Reviewed and discussed medications. Not taking Novolog due to low blood sugar. Tresiba 80 units at bedtime and ozempic weekly Discussed home blood sugar readings. Uses Freestyle Libre to monitor continuously. It alerts her when too high or too low. She is able to identify symptoms of hypoglycemia. Reports frequent episodes since starting ozempic. Averaging around 60 fasting and highest during the day has been 146. Will need to take CGM to endo visit for review. Had one episode of syncope with blood sugar in the 40s. Dr Dorris Fetch did decrease Tyler Aas from 100u to 80u after this incident.  Outreached Dr Dorris Fetch regarding hypoglycemic episodes and need for f/u appt Encouraged patient to eat multiple small meals a day since her appetite has decreased on ozempic. Reviewed appropriate diet. Advised that managing hypoglycemia is very important and that low blood sugar can serious  longterm consequences. Advised to call 911 or seek emergency medical attention if needed Assessed family/social support Assessed mobility and ability to perform ADLs Reviewed appointment with [facility] for liver disease Provided with [facility] contact number and encouraged to reach out as needed Telephone F/u scheduled with [facility] for 07/22/22        SDOH assessments and interventions completed:  Yes  SDOH Interventions Today    Flowsheet Row Most Recent Value  SDOH Interventions   Housing Interventions Intervention Not Indicated  Transportation Interventions Intervention Not Indicated  Financial Strain Interventions Intervention Not Indicated        Care Coordination Interventions Activated:  Yes  Care Coordination Interventions:  Yes, provided   Follow up plan: Follow up call scheduled for 07/22/22    Encounter Outcome:  Pt. Visit Completed   Chong Sicilian, BSN, RN-BC RN Care Coordinator [place]: [phone] Main #: [phone]

## 2022-07-21 NOTE — Telephone Encounter (Signed)
Attempted to call pt. No answer. Sent my chart message

## 2022-07-21 NOTE — Telephone Encounter (Signed)
-----   Message from [name], MD sent at 07/17/2022 11:48 AM EDT ----- Regarding: FW: Hypoglycemia Kim, Can you advise her to decrease her Tresiba to 80 units nightly, decrease NovoLog to 10   units  3 times daily AC for Premeal blood glucose readings above 90 mg per DL, associated with monitoring of blood glucose 4 times a day?    And keep her appointment. Thank you.  ----- Message ----- From: [name], RN Sent: 07/17/2022  11:10 AM EDT To: [name], MD Subject: Hypoglycemia                                   Dr Alisha [name],   Patient missed her visit with you yesterday due to nausea. She would like to reschedule if someone can reach out to her. She's uses the [place] to monitor blood sugar and reports that her fasting numbers are in the 50s and 60s and that the highest she's had during the day was 146. Since starting ozempic her appetite has decreased and she feels full after a few bites. Discussed intended effect of ozempic and encouraged to eat multiple small meals instead of trying to eat 3 larger meals. Discussed hypoglycemia management as well.   Can someone reach out to her to reschedule that appointment and discuss any recommendations you have prior to that appt?  Please let me know if I can be of any assistance.   Thank you,  Chong Sicilian, BSN, RN-BC [place]: [phone] Main #: [phone]

## 2022-07-22 ENCOUNTER — Ambulatory Visit: Payer: Self-pay | Admitting: *Deleted

## 2022-07-22 ENCOUNTER — Encounter: Payer: Self-pay | Admitting: *Deleted

## 2022-07-22 ENCOUNTER — Telehealth: Payer: Self-pay | Admitting: "Endocrinology

## 2022-07-22 MED ORDER — TRESIBA FLEXTOUCH 200 UNIT/ML ~~LOC~~ SOPN: 60.0000 [IU] | PEN_INJECTOR | Freq: Every day | SUBCUTANEOUS | 0 refills | Status: DC

## 2022-07-22 MED ORDER — NOVOLOG FLEXPEN 100 UNIT/ML ~~LOC~~ SOPN: 5.0000 [IU] | PEN_INJECTOR | Freq: Three times a day (TID) | SUBCUTANEOUS | 2 refills | Status: DC

## 2022-07-22 NOTE — Telephone Encounter (Signed)
Pt notified and agrees. She will call back if BG continues to be low.

## 2022-07-22 NOTE — Telephone Encounter (Signed)
Pt did not have her readings in front of her but she states that the morning readings are between 58-62 and all other readings are 90-100. She states that her BG does not get above 114 after meals. This is after changing the Toujeo to 80 units and Novolog 10, Ozempic 2 mg.  She made appt for 11/20

## 2022-07-22 NOTE — Patient Outreach (Signed)
  Care Coordination   Follow Up Visit Note   07/22/2022 Name: Alisha [name] MRN: [id number] DOB: 04/08/1951  Alisha [name] is a 71 y.o. year old female who sees Vyas, Costella Hatcher, MD for primary care. I spoke with  Alisha [name] by phone today.  What matters to the patients health and wellness today?  Managing hypoglycemia    Goals Addressed               This Visit's Progress     Patient Stated     DM Management: Hypoglycemia (pt-stated)        Care Coordination Interventions: Counseled on importance of regular laboratory monitoring as prescribed Discussed plans with patient for ongoing care management follow up and provided patient with direct contact information for care management team Advised patient, providing education and rationale, to check cbg 4 times a day and PRN with CGM and record, calling Dr Dorris Fetch for findings outside established parameters Review of patient status, including review of consultants reports, relevant laboratory and other test results, and medications completed Advised that I consulted with Dr Dorris Fetch regarding hypoglycemia. Provided patient with Dr Liliane Channel recommendations for medication adjustment.  Advised patient that Dr Liliane Channel office has attempted to reach her but was unsuccessful. Encouraged to call them today for an appointment and to let me know if she is unable to reach the.  Attempted to reach Dr Liliane Channel nurse via Teams and [place] but she was unavailable at the time Sent a staff message to Lavell Luster, LPN with Dr Liliane Channel office advising that patient will call to schedule an appt today and requested that office staff f/u with her within the next couple of days if she does not cal to schedule Reviewed and discussed medications. Not taking Novolog due to low blood sugar. Tresiba 80 units at bedtime and ozempic weekly. Advised that Dr Dorris Fetch recommended 10 units of novolog if blood sugar is >90 before meals Discussed home blood sugar  readings. Uses Freestyle Libre to monitor continuously. It alerts her when too high or too low. She is able to identify symptoms of hypoglycemia. Reports frequent episodes since starting ozempic. Averaging around 60 fasting and highest during the day has been 146. Will need to take CGM to endo visit for review. Pt states understanding.  Reminded patient to eat multiple small meals a day since her appetite has decreased on ozempic. Reviewed appropriate diet. Advised that managing hypoglycemia is very important and that low blood sugar can serious longterm consequences. Advised to call 911 or seek emergency medical attention if needed Provided with [facility] contact number and encouraged to reach out as needed Telephone F/u scheduled with [facility] for 08/05/22        SDOH assessments and interventions completed:  No   Care Coordination Interventions Activated:  Yes  Care Coordination Interventions:  Yes, provided   Follow up plan: Follow up call scheduled for 08/05/22    Encounter Outcome:  Pt. Visit Completed   Chong Sicilian, BSN, RN-BC RN Care Coordinator [place]: [phone] Main #: [phone]

## 2022-08-03 ENCOUNTER — Ambulatory Visit: Payer: Medicare PPO | Admitting: "Endocrinology

## 2022-08-03 ENCOUNTER — Encounter: Payer: Self-pay | Admitting: "Endocrinology

## 2022-08-03 VITALS — BP 104/56 | HR 72 | Ht 60.0 in | Wt 174.0 lb

## 2022-08-03 DIAGNOSIS — Z794 Long term (current) use of insulin: Secondary | ICD-10-CM

## 2022-08-03 DIAGNOSIS — Z789 Other specified health status: Secondary | ICD-10-CM | POA: Diagnosis not present

## 2022-08-03 DIAGNOSIS — E1169 Type 2 diabetes mellitus with other specified complication: Secondary | ICD-10-CM | POA: Diagnosis not present

## 2022-08-03 DIAGNOSIS — E782 Mixed hyperlipidemia: Secondary | ICD-10-CM

## 2022-08-03 DIAGNOSIS — I10 Essential (primary) hypertension: Secondary | ICD-10-CM | POA: Diagnosis not present

## 2022-08-03 DIAGNOSIS — E559 Vitamin D deficiency, unspecified: Secondary | ICD-10-CM | POA: Diagnosis not present

## 2022-08-03 LAB — POCT GLYCOSYLATED HEMOGLOBIN (HGB A1C): HbA1c, POC (controlled diabetic range): 5 % (ref 0.0–7.0)

## 2022-08-03 MED ORDER — TRESIBA FLEXTOUCH 200 UNIT/ML ~~LOC~~ SOPN
40.0000 [IU] | PEN_INJECTOR | Freq: Every day | SUBCUTANEOUS | 0 refills | Status: DC
Start: 2022-08-03 — End: 2022-09-02

## 2022-08-03 NOTE — Patient Instructions (Signed)
                                     Advice for Weight Management  -For most of us the best way to lose weight is by diet management. Generally speaking, diet management means consuming less calories intentionally which over time brings about progressive weight loss.  This can be achieved more effectively by avoiding ultra processed carbohydrates, processed meats, unhealthy fats.    It is critically important to know your numbers: how much calorie you are consuming and how much calorie you need. More importantly, our carbohydrates sources should be unprocessed naturally occurring  complex starch food items.  It is always important to balance nutrition also by  appropriate intake of proteins (mainly plant-based), healthy fats/oils, plenty of fruits and vegetables.   -The American College of Lifestyle Medicine (ACL M) recommends nutrition derived mostly from Whole Food, Plant Predominant Sources example an apple instead of applesauce or apple pie. Eat Plenty of vegetables, Mushrooms, fruits, Legumes, Whole Grains, Nuts, seeds in lieu of processed meats, processed snacks/pastries red meat, poultry, eggs.  Use only water or unsweetened tea for hydration.  The College also recommends the need to stay away from risky substances including alcohol, smoking; obtaining 7-9 hours of restorative sleep, at least 150 minutes of moderate intensity exercise weekly, importance of healthy social connections, and being mindful of stress and seek help when it is overwhelming.    -Sticking to a routine mealtime to eat 3 meals a day and avoiding unnecessary snacks is shown to have a big role in weight control. Under normal circumstances, the only time we burn stored energy is when we are hungry, so allow  some hunger to take place- hunger means no food between appropriate meal times, only water.  It is not advisable to starve.   -It is better to avoid simple carbohydrates including:  Cakes, Sweet Desserts, Ice Cream, Soda (diet and regular), Sweet Tea, Candies, Chips, Cookies, Store Bought Juices, Alcohol in Excess of  1-2 drinks a day, Lemonade,  Artificial Sweeteners, Doughnuts, Coffee Creamers, "Sugar-free" Products, etc, etc.  This is not a complete list.....    -Consulting with certified diabetes educators is proven to provide you with the most accurate and current information on diet.  Also, you may be  interested in discussing diet options/exchanges , we can schedule a visit with Alisha [name], RDN, CDE for individualized nutrition education.  -Exercise: If you are able: 30 -60 minutes a day ,4 days a week, or 150 minutes of moderate intensity exercise weekly.    The longer the better if tolerated.  Combine stretch, strength, and aerobic activities.  If you were told in the past that you have high risk for cardiovascular diseases, or if you are currently symptomatic, you may seek evaluation by your heart doctor prior to initiating moderate to intense exercise programs.                                  Additional Care Considerations for Diabetes/Prediabetes   -Diabetes  is a chronic disease.  The most important care consideration is regular follow-up with your diabetes care provider with the goal being avoiding or delaying its complications and to take advantage of advances in medications and technology.  If appropriate actions are taken early enough, type 2 diabetes can even be   reversed.  Seek information from the right source.  - Whole Food, Plant Predominant Nutrition is highly recommended: Eat Plenty of vegetables, Mushrooms, fruits, Legumes, Whole Grains, Nuts, seeds in lieu of processed meats, processed snacks/pastries red meat, poultry, eggs as recommended by American College of  Lifestyle Medicine (ACLM).  -Type 2 diabetes is known to coexist with other important comorbidities such as high blood pressure and high cholesterol.  It is critical to control not only the  diabetes but also the high blood pressure and high cholesterol to minimize and delay the risk of complications including coronary artery disease, stroke, amputations, blindness, etc.  The good news is that this diet recommendation for type 2 diabetes is also very helpful for managing high cholesterol and high blood blood pressure.  - Studies showed that people with diabetes will benefit from a class of medications known as ACE inhibitors and statins.  Unless there are specific reasons not to be on these medications, the standard of care is to consider getting one from these groups of medications at an optimal doses.  These medications are generally considered safe and proven to help protect the heart and the kidneys.    - People with diabetes are encouraged to initiate and maintain regular follow-up with eye doctors, foot doctors, dentists , and if necessary heart and kidney doctors.     - It is highly recommended that people with diabetes quit smoking or stay away from smoking, and get yearly  flu vaccine and pneumonia vaccine at least every 5 years.  See above for additional recommendations on exercise, sleep, stress management , and healthy social connections.      

## 2022-08-03 NOTE — Progress Notes (Signed)
08/03/2022, 12:58 PM  Endocrinology follow-up note   Subjective:    Patient ID: Alisha [name], female    DOB: 09-26-1950.  Alisha [name] is being seen in follow-up after she was seen in consultation for management of currently uncontrolled symptomatic diabetes requested by  Glenda Chroman, MD.   Past Medical History:  Diagnosis Date   B12 deficiency 11/03/2016   B12 deficiency 11/03/2016   Barrett's esophagus    Cirrhosis of liver without mention of alcohol    hep B surface antigen and HCV ab negative.pt has not had hepatitis A and B vaccines.U/S on 07/04/13 shows cirrhosis.   Diverticula of colon    pancolonic   Esophageal reflux    Esophageal varices (HCC)    Hiatal hernia    Iron deficiency anemia, unspecified    Other and unspecified hyperlipidemia    Portal hypertensive gastropathy (HCC)    Tubular adenoma    Type II or unspecified type diabetes mellitus without mention of complication, not stated as uncontrolled    Unspecified essential hypertension    Unspecified hemorrhoids without mention of complication     Past Surgical History:  Procedure Laterality Date   ABDOMINAL HYSTERECTOMY     BIOPSY  04/30/2016   Procedure: BIOPSY;  Surgeon: Daneil Dolin, MD;  [facility];  Service: Endoscopy;;  esophagus   BIOPSY  02/24/2019   Procedure: BIOPSY;  Surgeon: Daneil Dolin, MD;  [facility];  Service: Endoscopy;;   CESAREAN SECTION     x 2   COLONOSCOPY  03/2006   left sided diverticula, splenic flexure tublar adenoma   COLONOSCOPY  07/2002   villous tubular adenoma in rectum   COLONOSCOPY  09/23/09   external hemorrhoids/scattered pan colonic diverticula otherwise normal. cecal lipoma bx negative, normal TI. Next TCS 09/2014   COLONOSCOPY N/A 11/12/2014   Procedure: COLONOSCOPY;  Surgeon: Daneil Dolin, MD;  [facility];  Service: Endoscopy;   Laterality: N/A;  1215 - moved to 12:30 - Ginger to notify pt   COLONOSCOPY N/A 01/07/2017   Rourk: Grade 4 internal hemorrhoids, 5 mm ulcer in the sigmoid colon (benign bx), 7 mm polyp removed from the cecum (tubular adenoma), diverticulosis.next tcs in five years   COLONOSCOPY WITH PROPOFOL N/A 04/01/2022   Procedure: COLONOSCOPY WITH PROPOFOL;  Surgeon: Daneil Dolin, MD;  [facility];  Service: Endoscopy;  Laterality: N/A;  8:00am   ESOPHAGEAL BANDING N/A 12/20/2016   Procedure: ESOPHAGEAL BANDING;  Surgeon: Danie Binder, MD;  [facility];  Service: Endoscopy;  Laterality: N/A;   ESOPHAGEAL BANDING N/A 03/18/2017   Procedure: ESOPHAGEAL BANDING;  Surgeon: Daneil Dolin, MD;  [facility];  Service: Endoscopy;  Laterality: N/A;   ESOPHAGEAL BANDING  12/17/2017   Procedure: ESOPHAGEAL BANDING;  Surgeon: Daneil Dolin, MD;  [facility];  Service: Endoscopy;;   ESOPHAGEAL BANDING N/A 07/03/2020   Procedure: ESOPHAGEAL BANDING;  Surgeon: Daneil Dolin, MD;  [facility];  Service: Endoscopy;  Laterality: N/A;   ESOPHAGEAL BANDING  04/01/2022   Procedure: ESOPHAGEAL BANDING;  Surgeon:  Daneil Dolin, MD;  [facility];  Service: Endoscopy;;   ESOPHAGOGASTRODUODENOSCOPY  08/2006   barretts esophagus, no dyplasia   ESOPHAGOGASTRODUODENOSCOPY  03/2006   barretts esophagus,bx focal atypia c/w low grade dysplasia, SB bx negative for celiac   ESOPHAGOGASTRODUODENOSCOPY  09/23/09   3 columns of grade 2 esophageal varices/hiatal hernia/3-4 cm segment Barrett's esophagus without dysplasia. Next EGD 09/2012   ESOPHAGOGASTRODUODENOSCOPY N/A 11/11/2012   Dr. Gala Romney- Barrett;s esophagus, esophageal varices, portal gastopathy. hiatal hernia   ESOPHAGOGASTRODUODENOSCOPY N/A 04/30/2016   Procedure: ESOPHAGOGASTRODUODENOSCOPY (EGD);  Surgeon: Daneil Dolin, MD;  [facility];  Service: Endoscopy;  Laterality: N/A;  815    ESOPHAGOGASTRODUODENOSCOPY N/A 12/20/2016   Procedure: ESOPHAGOGASTRODUODENOSCOPY (EGD);  Surgeon: Danie Binder, MD;  [facility];  Service: Endoscopy;  Laterality: N/A;   ESOPHAGOGASTRODUODENOSCOPY N/A 01/07/2017   Procedure: ESOPHAGOGASTRODUODENOSCOPY (EGD);  Surgeon: Daneil Dolin, MD;  [facility];  Service: Endoscopy;  Laterality: N/A;   ESOPHAGOGASTRODUODENOSCOPY N/A 03/18/2017   Procedure: ESOPHAGOGASTRODUODENOSCOPY (EGD);  Surgeon: Daneil Dolin, MD;  [facility];  Service: Endoscopy;  Laterality: N/A;  1030    ESOPHAGOGASTRODUODENOSCOPY N/A 12/17/2017   Procedure: ESOPHAGOGASTRODUODENOSCOPY (EGD);  Surgeon: Daneil Dolin, MD;  [facility];  Service: Endoscopy;  Laterality: N/A;  10:30am   ESOPHAGOGASTRODUODENOSCOPY N/A 02/24/2019   Rourk: No esophageal varices seen, scars noted from prior banding.  5 cm segment of salmon-colored epithelium coming up from the GE junction consistent with Barrett's (bx with no dysplasia).  Portal gastropathy.  Small hiatal hernia.  Recommended repeat EGD in 18 months.   ESOPHAGOGASTRODUODENOSCOPY N/A 07/03/2020   Procedure: ESOPHAGOGASTRODUODENOSCOPY (EGD);  Surgeon: Daneil Dolin, MD;  [facility];  Service: Endoscopy;  Laterality: N/A;  10:15am   ESOPHAGOGASTRODUODENOSCOPY (EGD) WITH PROPOFOL N/A 04/01/2022   Procedure: ESOPHAGOGASTRODUODENOSCOPY (EGD) WITH PROPOFOL;  Surgeon: Daneil Dolin, MD;  [facility];  Service: Endoscopy;  Laterality: N/A;   [place]  2008   POLYPECTOMY  04/01/2022   Procedure: POLYPECTOMY;  Surgeon: Daneil Dolin, MD;  [facility];  Service: Endoscopy;;  ascending and descending   small bowel capsule endoscopy  10/2009   normal    Social History   Socioeconomic History   Marital status: Married    Spouse name: Not on file   Number of children: Not on file   Years of education: Not on file   Highest education level: Not on  file  Occupational History   Not on file  Tobacco Use   Smoking status: Never   Smokeless tobacco: Never  Vaping Use   Vaping Use: Never used  Substance and Sexual Activity   Alcohol use: No   Drug use: No   Sexual activity: Yes  Other Topics Concern   Not on file  Social History Narrative   Not on file   Social Determinants of Health   Financial Resource Strain: Low Risk  (07/17/2022)   Overall Financial Resource Strain (CARDIA)    Difficulty of Paying Living Expenses: Not hard at all  Food Insecurity: No Food Insecurity (04/16/2022)   Hunger Vital Sign    Worried About Running Out of Food in the Last Year: Never true    [place] in the Last Year: Never true  Transportation Needs: No Transportation Needs (07/17/2022)   PRAPARE - Hydrologist (Medical): No    Lack of Transportation (Non-Medical): No  Physical Activity: Not on file  Stress: Not on file  Social Connections: Not on file    Family History  Problem Relation Age of Onset   Alzheimer's disease Mother    CAD Father    Diabetes Mellitus II Father    Alzheimer's disease Father    Diabetes Mellitus II Sister    Colon cancer Sister 74       small, surgical excision; chemo/rad not needed   Cancer - Other Brother    Diabetes Mellitus II Brother    Hypertension Brother    Liver disease Neg Hx     Outpatient Encounter Medications as of 08/03/2022  Medication Sig   Accu-Chek Softclix Lancets lancets USE TO CHECK BLOOD SUGAR FOUR TIMES DAILY. Dx: E11.65   Cholecalciferol (VITAMIN D) 2000 units tablet Take 2,000 Units by mouth daily.   Continuous Blood Gluc Receiver (FREESTYLE LIBRE 2 READER) DEVI As directed   Continuous Blood Gluc Sensor (FREESTYLE LIBRE 2 SENSOR) MISC 1 Piece by Does not apply route every 14 (fourteen) days.   insulin degludec (TRESIBA FLEXTOUCH) 200 UNIT/ML FlexTouch Pen Inject 40 Units into the skin at bedtime.   lisinopril (PRINIVIL,ZESTRIL) 10 MG tablet Take  10 mg by mouth daily.   NOVOFINE 32G X 6 MM MISC USE ONE AS DIRECTED TWICE DAILY.   OZEMPIC, 2 MG/DOSE, 8 MG/3ML SOPN INJECT 2MG UNDER THE SKIN EVERY WEEK   pantoprazole (PROTONIX) 40 MG tablet Take 1 tablet (40 mg total) by mouth 2 (two) times daily before a meal.   propranolol (INDERAL) 10 MG tablet TAKE THREE (3) TABLETS BY MOUTH EVERY MORNING AND 3 TABLETS EVERY EVENING   [DISCONTINUED] insulin aspart (NOVOLOG FLEXPEN) 100 UNIT/ML FlexPen Inject 5-11 Units into the skin 3 (three) times daily before meals.   [DISCONTINUED] insulin degludec (TRESIBA FLEXTOUCH) 200 UNIT/ML FlexTouch Pen Inject 60 Units into the skin at bedtime.   No facility-administered encounter medications on file as of 08/03/2022.    ALLERGIES: Allergies  Allergen Reactions   Codeine Nausea And Vomiting    VACCINATION STATUS:  There is no immunization history on file for this patient.  Diabetes She presents for her follow-up diabetic visit. She has type 2 diabetes mellitus. Onset time: She was diagnosed at approximate age of 71 years. Her disease course has been improving. There are no hypoglycemic associated symptoms. Pertinent negatives for hypoglycemia include no confusion, headaches, pallor or seizures. Pertinent negatives for diabetes include no chest pain, no fatigue, no polydipsia, no polyphagia and no polyuria. (She did not bring any logs nor meter to review.  Patient verbally reports that she is having fluctuating glycemic profile including hypoglycemia.) There are no hypoglycemic complications. Symptoms are improving. Diabetic complications include nephropathy. Risk factors for coronary artery disease include diabetes mellitus, hypertension, obesity, post-menopausal and family history. Her weight is decreasing steadily. She is following a generally unhealthy diet. When asked about meal planning, she reported none. She has had a previous visit with a dietitian. She participates in exercise intermittently. Her home  blood glucose trend is decreasing steadily. Her breakfast blood glucose range is generally 110-130 mg/dl. Her lunch blood glucose range is generally 110-130 mg/dl. Her dinner blood glucose range is generally 110-130 mg/dl. Her bedtime blood glucose range is generally 110-130 mg/dl. Her overall blood glucose range is 110-130 mg/dl. Alisha [name] presents with significantly improved glycemic profile.  Her CGM was downloaded and analyzed, 89% time in range, 1% level 1 hypoglycemia.  He has 9% mild hypoglycemia.  Her average blood glucose  is 112 mg per DL.  Her point-of-care A1c is 5% improving from 9% during her last visit.   ) An ACE inhibitor/angiotensin II receptor blocker is being taken. Eye exam is current.  Hypertension This is a chronic problem. The current episode started more than 1 year ago. The problem is controlled. Pertinent negatives include no chest pain, headaches, palpitations or shortness of breath. Risk factors for coronary artery disease include diabetes mellitus, family history, obesity, dyslipidemia, sedentary lifestyle and post-menopausal state. Past treatments include ACE inhibitors. Hypertensive end-organ damage includes kidney disease. Identifiable causes of hypertension include chronic renal disease.     Review of Systems  Constitutional:  Negative for chills, fatigue, fever and unexpected weight change.  HENT:  Negative for trouble swallowing and voice change.   Eyes:  Negative for visual disturbance.  Respiratory:  Negative for cough, shortness of breath and wheezing.   Cardiovascular:  Negative for chest pain, palpitations and leg swelling.  Gastrointestinal:  Negative for diarrhea, nausea and vomiting.  Endocrine: Negative for cold intolerance, heat intolerance, polydipsia, polyphagia and polyuria.  Musculoskeletal:  Negative for arthralgias and myalgias.  Skin:  Negative for color change, pallor, rash and wound.  Neurological:  Negative for seizures and headaches.   Psychiatric/Behavioral:  Negative for confusion and suicidal ideas.     Objective:       08/03/2022   10:23 AM 04/14/2022   11:09 AM 04/01/2022    9:10 AM  Vitals with BMI  Height 5' 0"  5' 0"    Weight 174 lbs 190 lbs 13 oz   BMI 91.50 56.97   Systolic 948 016 553  Diastolic 56 58 55  Pulse 72 72 83    BP (!) 104/56   Pulse 72   Ht 5' (1.524 m)   Wt 174 lb (78.9 kg)   BMI 33.98 kg/m   Wt Readings from Last 3 Encounters:  08/03/22 174 lb (78.9 kg)  04/14/22 190 lb 12.8 oz (86.5 kg)  03/31/22 187 lb 2.7 oz (84.9 kg)      CMP ( most recent) CMP     Component Value Date/Time   NA 137 03/26/2022 1518   K 4.1 03/26/2022 1518   CL 110 03/26/2022 1518   CO2 22 03/26/2022 1518   GLUCOSE 116 (H) 03/26/2022 1518   BUN 14 03/26/2022 1518   BUN 15 01/25/2020 0000   CREATININE 1.65 (H) 03/26/2022 1518   CREATININE 1.62 (H) 03/28/2018 1146   CALCIUM 8.7 (L) 03/26/2022 1518   CALCIUM 8.8 10/16/2021 1354   PROT 6.6 03/24/2022 1141   ALBUMIN 3.1 (L) 03/26/2022 1518   AST 33 03/24/2022 1141   ALT 19 03/24/2022 1141   ALKPHOS 101 03/24/2022 1141   BILITOT 2.9 (H) 03/24/2022 1141   GFRNONAA 33 (L) 03/26/2022 1518   GFRAA 36 (L) 05/29/2020 1416     Diabetic Labs (most recent): Lab Results  Component Value Date   HGBA1C 5.0 08/03/2022   HGBA1C 9.0 (A) 03/24/2022   HGBA1C 9.3 (A) 11/26/2021   MICROALBUR 30 03/24/2022   MICROALBUR 25.8 (H) 12/23/2021   MICROALBUR 9.6 (H) 06/20/2021     Lipid Panel ( most recent) Lipid Panel     Component Value Date/Time   CHOL 143 11/24/2021 1239   TRIG 98 11/24/2021 1239   HDL 41 11/24/2021 1239   CHOLHDL 3.5 11/24/2021 1239   VLDL 20 11/24/2021 1239   LDLCALC 82 11/24/2021 1239      Lab Results  Component Value Date  TSH 2.577 11/24/2021   TSH 2.531 12/20/2020   TSH 2.46 01/25/2020   FREET4 0.92 11/24/2021   FREET4 0.97 12/20/2020      Assessment & Plan:   1. Uncontrolled type 2 diabetes mellitus with stage 3  chronic kidney disease ([place])  - Alisha [name] has currently uncontrolled symptomatic type 2 DM since 71 years of age.   Alisha [name] presents with significantly improved glycemic profile.  Her CGM was downloaded and analyzed, 89% time in range, 1% level 1 hypoglycemia.  He has 9% mild hypoglycemia.  Her average blood glucose is 112 mg per DL.  Her point-of-care A1c is 5% improving from 9% during her last visit.    Recent labs reviewed. - I had a long discussion with her about the progressive nature of diabetes and the pathology behind its complications. -her diabetes is complicated by CKD and she remains at a high risk for more acute and chronic complications which include CAD, CVA, CKD, retinopathy, and neuropathy. These are all discussed in detail with her.  - I have counseled her on diet  and weight management  by adopting a carbohydrate restricted/protein rich diet. Patient is encouraged to switch to  unprocessed or minimally processed     complex starch and increased protein intake (animal or plant source), fruits, and vegetables. -  she is advised to stick to a routine mealtimes to eat 3 meals  a day and avoid unnecessary snacks ( to snack only to correct hypoglycemia).   Evidently, she is not utilizing her prescribed medication optimally.  She is scans  1-2 on average daily which will not allow her to utilize intensive treatment with basal/bolus insulin.   - she acknowledges that there is a room for improvement in her food and drink choices. - Suggestion is made for her to avoid simple carbohydrates  from her diet including Cakes, Sweet Desserts, Ice Cream, Soda (diet and regular), Sweet Tea, Candies, Chips, Cookies, Store Bought Juices, Alcohol , Artificial Sweeteners,  Coffee Creamer, and "Sugar-free" Products, Lemonade. This will help patient to have more stable blood glucose profile and potentially avoid unintended weight gain.  The following Lifestyle Medicine recommendations  according to [place]  [facility]) were discussed and and offered to patient and she  agrees to start the journey:  A. Whole Foods, Plant-Based Nutrition comprising of fruits and vegetables, plant-based proteins, whole-grain carbohydrates was discussed in detail with the patient.   A list for source of those nutrients were also provided to the patient.  Patient will use only water or unsweetened tea for hydration. B.  The need to stay away from risky substances including alcohol, smoking; obtaining 7 to 9 hours of restorative sleep, at least 150 minutes of moderate intensity exercise weekly, the importance of healthy social connections,  and stress management techniques were discussed. C.  A full color page of  Calorie density of various food groups per pound showing examples of each food groups was provided to the patient.    - I have approached her with the following individualized plan to manage  her diabetes and patient agrees:    -She presents with continued engagement and improvement in her glycemic profile.   -Due to tight glycemic profile, she is advised to discontinue NovoLog for now.  She is advised to lower her Tresiba to 40 units nightly, advised to continue to use her CGM continuously. -She is benefiting from her GLP-1 receptor agonist, advised to continue Ozempic 2 mg subcutaneously  weekly. - she is not a candidate for Metformin, due to concurrent renal insufficiency.  - Specific targets for  A1c;  LDL, HDL,  and Triglycerides were discussed with the patient.  2) Blood Pressure /Hypertension:  -Her blood pressure is controlled slightly. she is advised to continue her current medications including lisinopril 10 mg p.o. daily with breakfast .   3) Lipids/Hyperlipidemia:   Review of her recent lipid panel showed  controlled uncontrolled LDL at 82 slightly improving from 91.     she  is not on statins.  She reports statin intolerance.  Whole food plant-based  diet discussed and recommended above will help with dyslipidemia as well.  We considered for fasting lipid panel before her next visit.    4)  Weight/Diet:  Body mass index is 33.98 kg/m.  -Presents with 15 pounds weight loss,    she is  a candidate for weight loss. I discussed with her the fact that loss of 5 - 10% of her  current body weight will have the most impact on her diabetes management.  Exercise, and detailed carbohydrates information provided  -  detailed on discharge instructions.  5) Chronic Care/Health Maintenance:  -she  is on ACEI Statin medications and  is encouraged to initiate and continue to follow up with Ophthalmology, Dentist,  Podiatrist at least yearly or according to recommendations, and advised to   stay away from smoking. I have recommended yearly flu vaccine and pneumonia vaccine at least every 5 years; moderate intensity exercise for up to 150 minutes weekly; and  sleep for at least 7 hours a day.  6.  Vitamin D deficiency: She is advised to continue cholecalciferol 2000 units daily.   She recently had normal ABI, will be repeated in 5 years.  - she is  advised to maintain close follow up with Glenda Chroman, MD for primary care needs, as well as her other providers for optimal and coordinated care.   I spent 41 minutes in the care of the patient today including review of labs from [place], Lipids, Thyroid Function, Hematology (current and previous including abstractions from other facilities); face-to-face time discussing  her blood glucose readings/logs, discussing hypoglycemia and hyperglycemia episodes and symptoms, medications doses, her options of short and long term treatment based on the latest standards of care / guidelines;  discussion about incorporating lifestyle medicine;  and documenting the encounter. Risk reduction counseling performed per USPSTF guidelines to reduce  obesity and cardiovascular risk factors.     Please refer to Patient Instructions for  Blood Glucose Monitoring and Insulin/Medications Dosing Guide"  in media tab for additional information. Please  also refer to " Patient Self Inventory" in the Media  tab for reviewed elements of pertinent patient history.  Alisha [name] participated in the discussions, expressed understanding, and voiced agreement with the above plans.  All questions were answered to her satisfaction. she is encouraged to contact clinic should she have any questions or concerns prior to her return visit.    Follow up plan: - No follow-ups on file.  Glade Lloyd, MD [facility] [address] [place], [place] [address] Phone: [phone]  Fax: [phone]    08/03/2022, 12:58 PM  This note was partially dictated with voice recognition software. Similar sounding words can be transcribed inadequately or may not  be corrected upon review.

## 2022-08-05 ENCOUNTER — Encounter: Payer: Self-pay | Admitting: *Deleted

## 2022-08-05 ENCOUNTER — Ambulatory Visit: Payer: Self-pay | Admitting: *Deleted

## 2022-08-05 NOTE — Patient Outreach (Signed)
  Care Coordination   Follow Up Visit Note   08/05/2022 Name: Alisha [name] MRN: [id number] DOB: 01-Sep-1951  Alisha [name] is a 71 y.o. year old female who sees Vyas, Costella Hatcher, MD for primary care. I spoke with  Alisha [name] by phone today.  What matters to the patients health and wellness today?  Managing blood sugar    Goals Addressed               This Visit's Progress     Patient Stated     DM Management: Hypoglycemia (pt-stated)        Care Coordination Interventions: Counseled on importance of regular laboratory monitoring as prescribed Discussed plans with patient for ongoing care management follow up and provided patient with direct contact information for care management team Advised patient, providing education and rationale, to check cbg 4 times a day and PRN with CGM and record, calling Dr Dorris Fetch for findings outside established parameters Review of patient status, including review of consultants reports, relevant laboratory and other test results, and medications completed Discussed visit with Dr Dorris Fetch on 11/20 and A1C of 5 Reviewed and discussed medication changes. Discontinued mealtime Novolog and decreased Tresiba to 40 units nightly. No change in ozempic. Advised patient that Dr Liliane Channel office has attempted to reach her but was unsuccessful. Encouraged to call them today for an appointment and to let me know if she is unable to reach the.  Discussed home blood sugar readings. Uses Freestyle Libre to monitor continuously. It alerts her when too high or too low. She is able to identify symptoms of hypoglycemia. Lowest reading since adjustment has been 95. No readings over 200.  Reminded patient to eat multiple small meals a day since her appetite has decreased on ozempic. Reviewed appropriate diet. Reminded that managing hypoglycemia is very important and that low blood sugar can serious longterm consequences. Advised to call 911 or seek emergency medical  attention if needed Reviewed next appt with Dr Dorris Fetch: 11/03/22 Provided with [facility] contact number and encouraged to reach out as needed Provided patient with [place] 24-hour Nurse/Concierge line [phone]          SDOH assessments and interventions completed:  No     Care Coordination Interventions Activated:  Yes  Care Coordination Interventions:  Yes, provided   Follow up plan: Follow up call scheduled for 08/13/22    Encounter Outcome:  Pt. Visit Completed   Chong Sicilian, BSN, RN-BC RN Care Coordinator [place]: [phone] Main #: [phone]

## 2022-08-08 DIAGNOSIS — E1165 Type 2 diabetes mellitus with hyperglycemia: Secondary | ICD-10-CM | POA: Diagnosis not present

## 2022-08-10 ENCOUNTER — Other Ambulatory Visit (HOSPITAL_COMMUNITY)
Admission: RE | Admit: 2022-08-10 | Discharge: 2022-08-10 | Disposition: A | Discharge status: Home / Self Care | Payer: Medicare PPO | Source: Ambulatory Visit | Attending: Nephrology | Admitting: Nephrology

## 2022-08-10 DIAGNOSIS — H35013 Changes in retinal vascular appearance, bilateral: Secondary | ICD-10-CM | POA: Diagnosis not present

## 2022-08-10 DIAGNOSIS — N189 Chronic kidney disease, unspecified: Secondary | ICD-10-CM | POA: Diagnosis not present

## 2022-08-10 DIAGNOSIS — H26492 Other secondary cataract, left eye: Secondary | ICD-10-CM | POA: Diagnosis not present

## 2022-08-10 DIAGNOSIS — H524 Presbyopia: Secondary | ICD-10-CM | POA: Diagnosis not present

## 2022-08-10 DIAGNOSIS — E113293 Type 2 diabetes mellitus with mild nonproliferative diabetic retinopathy without macular edema, bilateral: Secondary | ICD-10-CM | POA: Diagnosis not present

## 2022-08-10 DIAGNOSIS — H26493 Other secondary cataract, bilateral: Secondary | ICD-10-CM | POA: Diagnosis not present

## 2022-08-10 DIAGNOSIS — H35362 Drusen (degenerative) of macula, left eye: Secondary | ICD-10-CM | POA: Diagnosis not present

## 2022-08-10 LAB — CBC
HCT: 30.3 % — ABNORMAL LOW (ref 36.0–46.0)
Hemoglobin: 10.5 g/dL — ABNORMAL LOW (ref 12.0–15.0)
MCH: 32 pg (ref 26.0–34.0)
MCHC: 34.7 g/dL (ref 30.0–36.0)
MCV: 92.4 fL (ref 80.0–100.0)
Platelets: 56 10*3/uL — ABNORMAL LOW (ref 150–400)
RBC: 3.28 MIL/uL — ABNORMAL LOW (ref 3.87–5.11)
RDW: 15.8 % — ABNORMAL HIGH (ref 11.5–15.5)
WBC: 4.2 10*3/uL (ref 4.0–10.5)
nRBC: 0 % (ref 0.0–0.2)

## 2022-08-10 LAB — RENAL FUNCTION PANEL
Albumin: 3 g/dL — ABNORMAL LOW (ref 3.5–5.0)
Anion gap: 4 — ABNORMAL LOW (ref 5–15)
BUN: 19 mg/dL (ref 8–23)
CO2: 21 mmol/L — ABNORMAL LOW (ref 22–32)
Calcium: 8.8 mg/dL — ABNORMAL LOW (ref 8.9–10.3)
Chloride: 111 mmol/L (ref 98–111)
Creatinine, Ser: 1.76 mg/dL — ABNORMAL HIGH (ref 0.44–1.00)
GFR, Estimated: 30 mL/min — ABNORMAL LOW (ref >60)
Glucose, Bld: 160 mg/dL — ABNORMAL HIGH (ref 70–99)
Phosphorus: 2.9 mg/dL (ref 2.5–4.6)
Potassium: 4.5 mmol/L (ref 3.5–5.1)
Sodium: 136 mmol/L (ref 135–145)

## 2022-08-10 LAB — PROTEIN / CREATININE RATIO, URINE
Creatinine, Urine: 336.77 mg/dL
Protein Creatinine Ratio: 0.02 mg/mg{Cre} (ref 0.00–0.15)
Total Protein, Urine: 8 mg/dL

## 2022-08-13 ENCOUNTER — Ambulatory Visit: Payer: Self-pay | Admitting: *Deleted

## 2022-08-13 ENCOUNTER — Encounter: Payer: Self-pay | Admitting: *Deleted

## 2022-08-13 NOTE — Patient Outreach (Signed)
  Care Coordination   Follow Up Visit Note   08/13/2022 Name: Alisha [name] MRN: [id number] DOB: December 29, 1950  Alisha [name] is a 71 y.o. year old female who sees Vyas, Costella Hatcher, MD for primary care. I spoke with  Alisha [name] by phone today.  What matters to the patients health and wellness today?  Blood sugar management    Goals Addressed               This Visit's Progress     Patient Stated     DM Management: Hypoglycemia (pt-stated)        Care Coordination Interventions: Counseled on importance of regular laboratory monitoring as prescribed Discussed plans with patient for ongoing care management follow up and provided patient with direct contact information for care management team Advised patient, providing education and rationale, to check cbg 4 times a day and PRN with CGM and record, calling Dr Dorris Fetch for findings outside established parameters Review of patient status, including review of consultants reports, relevant laboratory and other test results, and medications completed Discussed that home blood sugar readings have stabilized.  Encouraged to reach out to Dr Dorris Fetch with any readings outside of recommended range Reviewed next appt with Dr Dorris Fetch: 11/03/22 Provided with [facility] contact number and encouraged to reach out as needed Follow-up call scheduled for 09/11/22 Provided patient with [place] 24-hour Nurse/Concierge line [phone]        SDOH assessments and interventions completed:  No     Care Coordination Interventions:  Yes, provided   Follow up plan: Follow up call scheduled for 09/11/22    Encounter Outcome:  Pt. Visit Completed   Chong Sicilian, BSN, RN-BC RN Care Coordinator [place]: [phone] Main #: [phone]

## 2022-08-14 ENCOUNTER — Encounter: Payer: Self-pay | Admitting: Internal Medicine

## 2022-08-20 DIAGNOSIS — H26491 Other secondary cataract, right eye: Secondary | ICD-10-CM | POA: Diagnosis not present

## 2022-08-29 DIAGNOSIS — E1122 Type 2 diabetes mellitus with diabetic chronic kidney disease: Secondary | ICD-10-CM | POA: Diagnosis not present

## 2022-08-29 DIAGNOSIS — D696 Thrombocytopenia, unspecified: Secondary | ICD-10-CM | POA: Diagnosis not present

## 2022-08-29 DIAGNOSIS — N189 Chronic kidney disease, unspecified: Secondary | ICD-10-CM | POA: Diagnosis not present

## 2022-08-29 DIAGNOSIS — D631 Anemia in chronic kidney disease: Secondary | ICD-10-CM | POA: Diagnosis not present

## 2022-08-29 DIAGNOSIS — I129 Hypertensive chronic kidney disease with stage 1 through stage 4 chronic kidney disease, or unspecified chronic kidney disease: Secondary | ICD-10-CM | POA: Diagnosis not present

## 2022-08-29 DIAGNOSIS — C220 Liver cell carcinoma: Secondary | ICD-10-CM | POA: Diagnosis not present

## 2022-09-02 ENCOUNTER — Other Ambulatory Visit: Payer: Self-pay | Admitting: "Endocrinology

## 2022-09-02 ENCOUNTER — Other Ambulatory Visit: Payer: Self-pay | Admitting: Gastroenterology

## 2022-09-07 DIAGNOSIS — E1165 Type 2 diabetes mellitus with hyperglycemia: Secondary | ICD-10-CM | POA: Diagnosis not present

## 2022-09-11 ENCOUNTER — Encounter: Payer: Self-pay | Admitting: *Deleted

## 2022-09-11 ENCOUNTER — Ambulatory Visit: Payer: Self-pay | Admitting: *Deleted

## 2022-09-11 NOTE — Patient Outreach (Signed)
  Care Coordination   Follow Up Visit Note   09/11/2022 Name: Alisha [name] MRN: [id number] DOB: 06-23-1951  Alisha [name] is a 71 y.o. year old female who sees Vyas, Angelina Pih, MD for primary care. I spoke with  Alisha [name] by phone today.  What matters to the patients health and wellness today?  Blood sugar management    Goals Addressed               This Visit's Progress     Patient Stated     DM Management: Hypoglycemia (pt-stated)        Care Coordination Interventions: Counseled on importance of regular laboratory monitoring as prescribed Discussed plans with patient for ongoing care management follow up and provided patient with direct contact information for care management team Advised patient, providing education and rationale, to check cbg 4 times a day and PRN with CGM and record, calling Dr Fransico Him for findings outside established parameters Review of patient status, including review of consultants reports, relevant laboratory and other test results, and medications completed Reviewed and discussed medications Discussed that home blood sugar readings have stabilized but she's still having at least one reading between 60 and 70 a day. Usually between supper and bedtime.   Encouraged to talk with Dr Fransico Him about possibly adjusting the Evaristo Bury down a little more Reviewed next appt with Dr Fransico Him: 11/03/22 Provided with [facility] contact number and encouraged to reach out as needed Follow-up call scheduled for 11/16/22 Assessed for any additional Care Coordination or resource needs. None at this time.  Provided patient with [facility] 24-hour Nurse/Concierge line [phone]        SDOH assessments and interventions completed:  Yes SDOH Interventions Today    Flowsheet Row Most Recent Value  SDOH Interventions   Transportation Interventions Intervention Not Indicated  Financial Strain Interventions Intervention Not Indicated        Care  Coordination Interventions:  Yes, provided   Follow up plan: Follow up call scheduled for 11/16/22    Encounter Outcome:  Pt. Visit Completed   Demetrios Loll, BSN, RN-BC RN Care Coordinator [facility]  [facility] Direct Dial: [phone] Main #: [phone]  *Late entry for visit on 09/11/22

## 2022-09-15 NOTE — Patient Outreach (Deleted)
  Care Coordination   Follow Up Visit Note   09/11/2022 Name: Alisha [name] MRN: [id number] DOB: 16-Mar-1951  Alisha [name] is a 72 y.o. year old female who sees Vyas, Costella Hatcher, MD for primary care. I spoke with  Alisha [name] by phone today.  What matters to the patients health and wellness today?  Blood sugar management    Goals Addressed               This Visit's Progress     Patient Stated     DM Management: Hypoglycemia (pt-stated)        Care Coordination Interventions: Counseled on importance of regular laboratory monitoring as prescribed Discussed plans with patient for ongoing care management follow up and provided patient with direct contact information for care management team Advised patient, providing education and rationale, to check cbg 4 times a day and PRN with CGM and record, calling Dr Dorris Fetch for findings outside established parameters Review of patient status, including review of consultants reports, relevant laboratory and other test results, and medications completed Reviewed and discussed medications Discussed that home blood sugar readings have stabilized but she's still having at least one reading between 60 and 70 a day. Usually between supper and bedtime.   Encouraged to talk with Dr Dorris Fetch about possibly adjusting the Tyler Aas down a little more Reviewed next appt with Dr Dorris Fetch: 11/03/22 Provided with [facility] contact number and encouraged to reach out as needed Follow-up call scheduled for 11/16/22 Assessed for any additional Care Coordination or resource needs. None at this time.  Provided patient with [place] 24-hour Nurse/Concierge line [phone]        SDOH assessments and interventions completed:  {yes/no:20286}{THN Tip this will not be part of the note when signed-REQUIRED REPORT FIELD DO NOT DELETE (Optional):27901}  SDOH Interventions Today    Flowsheet Row Most Recent Value  SDOH Interventions    Transportation Interventions Intervention Not Indicated  Financial Strain Interventions Intervention Not Indicated        Care Coordination Interventions:  Yes, provided {THN Tip this will not be part of the note when signed-REQUIRED REPORT FIELD DO NOT DELETE (Optional):27901}  Follow up plan: Follow up call scheduled for ***   Encounter Outcome:  {ENCOUTCOME:27770} {THN Tip this will not be part of the note when signed-REQUIRED REPORT FIELD DO NOT DELETE (Optional):27901}

## 2022-09-16 DIAGNOSIS — R918 Other nonspecific abnormal finding of lung field: Secondary | ICD-10-CM | POA: Diagnosis not present

## 2022-09-16 DIAGNOSIS — K766 Portal hypertension: Secondary | ICD-10-CM | POA: Diagnosis not present

## 2022-09-16 DIAGNOSIS — C220 Liver cell carcinoma: Secondary | ICD-10-CM | POA: Diagnosis not present

## 2022-09-16 DIAGNOSIS — K7469 Other cirrhosis of liver: Secondary | ICD-10-CM | POA: Diagnosis not present

## 2022-09-16 DIAGNOSIS — K7581 Nonalcoholic steatohepatitis (NASH): Secondary | ICD-10-CM | POA: Diagnosis not present

## 2022-09-22 DIAGNOSIS — C220 Liver cell carcinoma: Secondary | ICD-10-CM | POA: Diagnosis not present

## 2022-09-22 DIAGNOSIS — K7469 Other cirrhosis of liver: Secondary | ICD-10-CM | POA: Diagnosis not present

## 2022-09-22 DIAGNOSIS — K7581 Nonalcoholic steatohepatitis (NASH): Secondary | ICD-10-CM | POA: Diagnosis not present

## 2022-10-02 ENCOUNTER — Inpatient Hospital Stay: Payer: Medicare PPO | Attending: Hematology

## 2022-10-02 DIAGNOSIS — D509 Iron deficiency anemia, unspecified: Secondary | ICD-10-CM | POA: Insufficient documentation

## 2022-10-02 DIAGNOSIS — E538 Deficiency of other specified B group vitamins: Secondary | ICD-10-CM | POA: Diagnosis not present

## 2022-10-02 DIAGNOSIS — N1832 Chronic kidney disease, stage 3b: Secondary | ICD-10-CM | POA: Diagnosis not present

## 2022-10-02 DIAGNOSIS — D500 Iron deficiency anemia secondary to blood loss (chronic): Secondary | ICD-10-CM

## 2022-10-02 DIAGNOSIS — K7581 Nonalcoholic steatohepatitis (NASH): Secondary | ICD-10-CM | POA: Insufficient documentation

## 2022-10-02 DIAGNOSIS — R918 Other nonspecific abnormal finding of lung field: Secondary | ICD-10-CM | POA: Insufficient documentation

## 2022-10-02 DIAGNOSIS — D696 Thrombocytopenia, unspecified: Secondary | ICD-10-CM | POA: Diagnosis not present

## 2022-10-02 LAB — COMPREHENSIVE METABOLIC PANEL
ALT: 15 U/L (ref 0–44)
AST: 36 U/L (ref 15–41)
Albumin: 3.2 g/dL — ABNORMAL LOW (ref 3.5–5.0)
Alkaline Phosphatase: 80 U/L (ref 38–126)
Anion gap: 6 (ref 5–15)
BUN: 17 mg/dL (ref 8–23)
CO2: 27 mmol/L (ref 22–32)
Calcium: 8.8 mg/dL — ABNORMAL LOW (ref 8.9–10.3)
Chloride: 104 mmol/L (ref 98–111)
Creatinine, Ser: 1.73 mg/dL — ABNORMAL HIGH (ref 0.44–1.00)
GFR, Estimated: 31 mL/min — ABNORMAL LOW (ref >60)
Glucose, Bld: 80 mg/dL (ref 70–99)
Potassium: 4 mmol/L (ref 3.5–5.1)
Sodium: 137 mmol/L (ref 135–145)
Total Bilirubin: 3.1 mg/dL — ABNORMAL HIGH (ref 0.3–1.2)
Total Protein: 6.5 g/dL (ref 6.5–8.1)

## 2022-10-02 LAB — CBC WITH DIFFERENTIAL/PLATELET
Abs Immature Granulocytes: 0.01 10*3/uL (ref 0.00–0.07)
Basophils Absolute: 0 10*3/uL (ref 0.0–0.1)
Basophils Relative: 1 %
Eosinophils Absolute: 0.2 10*3/uL (ref 0.0–0.5)
Eosinophils Relative: 3 %
HCT: 35.3 % — ABNORMAL LOW (ref 36.0–46.0)
Hemoglobin: 11.9 g/dL — ABNORMAL LOW (ref 12.0–15.0)
Immature Granulocytes: 0 %
Lymphocytes Relative: 20 %
Lymphs Abs: 0.9 10*3/uL (ref 0.7–4.0)
MCH: 32 pg (ref 26.0–34.0)
MCHC: 33.7 g/dL (ref 30.0–36.0)
MCV: 94.9 fL (ref 80.0–100.0)
Monocytes Absolute: 0.4 10*3/uL (ref 0.1–1.0)
Monocytes Relative: 8 %
Neutro Abs: 3.1 10*3/uL (ref 1.7–7.7)
Neutrophils Relative %: 68 %
Platelets: 65 10*3/uL — ABNORMAL LOW (ref 150–400)
RBC: 3.72 MIL/uL — ABNORMAL LOW (ref 3.87–5.11)
RDW: 14.7 % (ref 11.5–15.5)
WBC: 4.7 10*3/uL (ref 4.0–10.5)
nRBC: 0 % (ref 0.0–0.2)

## 2022-10-02 LAB — IRON AND TIBC
Iron: 106 ug/dL (ref 28–170)
Saturation Ratios: 45 % — ABNORMAL HIGH (ref 10.4–31.8)
TIBC: 235 ug/dL — ABNORMAL LOW (ref 250–450)
UIBC: 129 ug/dL

## 2022-10-02 LAB — FERRITIN: Ferritin: 218 ng/mL (ref 11–307)

## 2022-10-02 LAB — VITAMIN B12: Vitamin B-12: 587 pg/mL (ref 180–914)

## 2022-10-08 DIAGNOSIS — E1165 Type 2 diabetes mellitus with hyperglycemia: Secondary | ICD-10-CM | POA: Diagnosis not present

## 2022-10-08 LAB — METHYLMALONIC ACID, SERUM: Methylmalonic Acid, Quantitative: 182 nmol/L (ref 0–378)

## 2022-10-08 NOTE — Progress Notes (Deleted)
[facility] [address], [place] [address]   CLINIC:  Medical Oncology/Hematology  PCP:  Glenda Chroman, MD [place] [address] [phone]   REASON FOR VISIT:  Follow-up for iron deficiency anemia (intermittent GI bleeding) and thrombocytopenia (cirrhosis)  CURRENT THERAPY: Intermittent IV iron infusions  INTERVAL HISTORY:   Ms. Mcglathery 72 y.o. female returns for routine follow-up of her iron deficiency anemia (intermittent GI bleeding) and thrombocytopenia (cirrhosis/splenomegaly).  She was last seen by Tarri Abernethy PA-C on 03/31/2022  At today's visit, she reports feeling ***.  No recent hospitalizations, surgeries, or changes in baseline health status.  *** She continues to follow at [facility] for her liver cirrhosis, HCC lesion, and lung mass.   *** Her chronic fatigue is at baseline. *** She has not had any GI bleeding events or esophageal banding since her last visit. *** She denies any recent signs of bleeding such as hematemesis, hematochezia, melena, or epistaxis. *** She denies any significant bruising or petechial rash. *** She has not noticed any pica, restless legs, headache, chest pain, dyspnea on exertion, lightheadedness, or syncope. *** No B symptoms such as fever, chills, night sweats, unintentional weight loss.  She has ***% energy and ***% appetite. She endorses that she is maintaining a stable weight.   ASSESSMENT & PLAN:  1.  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  Iron saturation 45%.  Ferritin 218. - Denies  recent bleeding events, no melena or bright red blood per rectum***    - PLAN:  No indication for IV iron supplementation at this time. - Repeat labs and RTC in 6 months, or sooner if indicated.   2.  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   3.  Vitamin B12 deficiency - Most recent labs (10/02/2022) show B12 at 587 with normal methylmalonic acid - Patient is currently not taking any vitamin B12 supplements (these were stopped by her endocrinologist) - PLAN: Recheck vitamin B12 and methylmalonic acid in 6 months.   4.  CKD stage IIIb/IV - Creatinine stable at baseline - Follows with Dr. Theador Hawthorne   5.  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 Gala Romney, who she follows with for local/general GI care   6.  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  Other history - PMH: Cirrhosis, chronic kidney disease, congestive heart failure, type 2 diabetes, hypertension - SOCIAL: Lifelong non-smoker.  No tobacco,  alcohol, illicit drug use.  PLAN SUMMARY: >> *** >> *** >> ***   [facility] [place] **   You were seen today by Tarri Abernethy PA-C for your ***.    *** ***  *** ***  LABS: Return in ***   OTHER TESTS: ***  MEDICATIONS: ***  FOLLOW-UP APPOINTMENT: ***     REVIEW OF SYSTEMS: ***  Review of Systems - Oncology   PHYSICAL EXAM:  ECOG PERFORMANCE STATUS: {CHL ONC ECOG WU:[id number] *** There were no vitals filed for this visit. There were no vitals filed for this visit. Physical Exam  PAST MEDICAL/SURGICAL HISTORY:  Past Medical History:  Diagnosis Date   B12 deficiency 11/03/2016   B12 deficiency 11/03/2016   Barrett's esophagus    Cirrhosis of liver without mention of alcohol    hep B surface antigen and HCV ab negative.pt has not had hepatitis A and B vaccines.U/S on 07/04/13 shows cirrhosis.   Diverticula of colon    pancolonic   Esophageal reflux    Esophageal varices (HCC)    Hiatal hernia    Iron deficiency anemia, unspecified    Other and unspecified hyperlipidemia    Portal hypertensive gastropathy (HCC)    Tubular adenoma    Type II or unspecified type diabetes mellitus without mention of complication, not stated as uncontrolled    Unspecified essential hypertension    Unspecified hemorrhoids without mention of complication    Past Surgical History:  Procedure Laterality Date   ABDOMINAL HYSTERECTOMY     BIOPSY  04/30/2016   Procedure: BIOPSY;  Surgeon: Daneil Dolin, MD;  [facility];  Service: Endoscopy;;  esophagus   BIOPSY  02/24/2019   Procedure: BIOPSY;  Surgeon: Daneil Dolin, MD;  [facility];  Service: Endoscopy;;   CESAREAN SECTION     x 2   COLONOSCOPY  03/2006   left sided diverticula, splenic flexure tublar adenoma   COLONOSCOPY  07/2002   villous tubular adenoma in rectum   COLONOSCOPY  09/23/09   external hemorrhoids/scattered pan  colonic diverticula otherwise normal. cecal lipoma bx negative, normal TI. Next TCS 09/2014   COLONOSCOPY N/A 11/12/2014   Procedure: COLONOSCOPY;  Surgeon: Daneil Dolin, MD;  [facility];  Service: Endoscopy;  Laterality: N/A;  1215 - moved to 12:30 - Ginger to notify pt   COLONOSCOPY N/A 01/07/2017   Rourk: Grade 4 internal hemorrhoids, 5 mm ulcer in the sigmoid colon (benign bx), 7 mm polyp removed from the cecum (tubular adenoma), diverticulosis.next tcs in five years   COLONOSCOPY WITH PROPOFOL N/A 04/01/2022   Procedure: COLONOSCOPY WITH PROPOFOL;  Surgeon: Daneil Dolin, MD;  [facility];  Service: Endoscopy;  Laterality: N/A;  8:00am   ESOPHAGEAL BANDING N/A 12/20/2016   Procedure: ESOPHAGEAL BANDING;  Surgeon: Danie Binder, MD;  [facility];  Service: Endoscopy;  Laterality: N/A;   ESOPHAGEAL BANDING N/A 03/18/2017   Procedure: ESOPHAGEAL BANDING;  Surgeon: Daneil Dolin, MD;  [facility];  Service: Endoscopy;  Laterality: N/A;   ESOPHAGEAL BANDING  12/17/2017  Procedure: ESOPHAGEAL BANDING;  Surgeon: Daneil Dolin, MD;  [facility];  Service: Endoscopy;;   ESOPHAGEAL BANDING N/A 07/03/2020   Procedure: ESOPHAGEAL BANDING;  Surgeon: Daneil Dolin, MD;  [facility];  Service: Endoscopy;  Laterality: N/A;   ESOPHAGEAL BANDING  04/01/2022   Procedure: ESOPHAGEAL BANDING;  Surgeon: Daneil Dolin, MD;  [facility];  Service: Endoscopy;;   ESOPHAGOGASTRODUODENOSCOPY  08/2006   barretts esophagus, no dyplasia   ESOPHAGOGASTRODUODENOSCOPY  03/2006   barretts esophagus,bx focal atypia c/w low grade dysplasia, SB bx negative for celiac   ESOPHAGOGASTRODUODENOSCOPY  09/23/09   3 columns of grade 2 esophageal varices/hiatal hernia/3-4 cm segment Barrett's esophagus without dysplasia. Next EGD 09/2012   ESOPHAGOGASTRODUODENOSCOPY N/A 11/11/2012   Dr. Gala Romney- Barrett;s esophagus, esophageal varices, portal  gastopathy. hiatal hernia   ESOPHAGOGASTRODUODENOSCOPY N/A 04/30/2016   Procedure: ESOPHAGOGASTRODUODENOSCOPY (EGD);  Surgeon: Daneil Dolin, MD;  [facility];  Service: Endoscopy;  Laterality: N/A;  815   ESOPHAGOGASTRODUODENOSCOPY N/A 12/20/2016   Procedure: ESOPHAGOGASTRODUODENOSCOPY (EGD);  Surgeon: Danie Binder, MD;  [facility];  Service: Endoscopy;  Laterality: N/A;   ESOPHAGOGASTRODUODENOSCOPY N/A 01/07/2017   Procedure: ESOPHAGOGASTRODUODENOSCOPY (EGD);  Surgeon: Daneil Dolin, MD;  [facility];  Service: Endoscopy;  Laterality: N/A;   ESOPHAGOGASTRODUODENOSCOPY N/A 03/18/2017   Procedure: ESOPHAGOGASTRODUODENOSCOPY (EGD);  Surgeon: Daneil Dolin, MD;  [facility];  Service: Endoscopy;  Laterality: N/A;  1030    ESOPHAGOGASTRODUODENOSCOPY N/A 12/17/2017   Procedure: ESOPHAGOGASTRODUODENOSCOPY (EGD);  Surgeon: Daneil Dolin, MD;  [facility];  Service: Endoscopy;  Laterality: N/A;  10:30am   ESOPHAGOGASTRODUODENOSCOPY N/A 02/24/2019   Rourk: No esophageal varices seen, scars noted from prior banding.  5 cm segment of salmon-colored epithelium coming up from the GE junction consistent with Barrett's (bx with no dysplasia).  Portal gastropathy.  Small hiatal hernia.  Recommended repeat EGD in 18 months.   ESOPHAGOGASTRODUODENOSCOPY N/A 07/03/2020   Procedure: ESOPHAGOGASTRODUODENOSCOPY (EGD);  Surgeon: Daneil Dolin, MD;  [facility];  Service: Endoscopy;  Laterality: N/A;  10:15am   ESOPHAGOGASTRODUODENOSCOPY (EGD) WITH PROPOFOL N/A 04/01/2022   Procedure: ESOPHAGOGASTRODUODENOSCOPY (EGD) WITH PROPOFOL;  Surgeon: Daneil Dolin, MD;  [facility];  Service: Endoscopy;  Laterality: N/A;   [place]  2008   POLYPECTOMY  04/01/2022   Procedure: POLYPECTOMY;  Surgeon: Daneil Dolin, MD;  [facility];  Service: Endoscopy;;  ascending and descending   small bowel capsule endoscopy  10/2009    normal    SOCIAL HISTORY:  Social History   Socioeconomic History   Marital status: Married    Spouse name: Not on file   Number of children: Not on file   Years of education: Not on file   Highest education level: Not on file  Occupational History   Not on file  Tobacco Use   Smoking status: Never   Smokeless tobacco: Never  Vaping Use   Vaping Use: Never used  Substance and Sexual Activity   Alcohol use: No   Drug use: No   Sexual activity: Yes  Other Topics Concern   Not on file  Social History Narrative   Not on file   Social Determinants of Health   Financial Resource Strain: Low Risk  (09/11/2022)   Overall Financial Resource Strain (CARDIA)    Difficulty of Paying Living Expenses: Not hard at all  Food Insecurity: No Food Insecurity (04/16/2022)  Hunger Vital Sign    Worried About Running Out of Food in the Last Year: Never true    Ran Out of Food in the Last Year: Never true  Transportation Needs: No Transportation Needs (09/11/2022)   PRAPARE - Hydrologist (Medical): No    Lack of Transportation (Non-Medical): No  Physical Activity: Not on file  Stress: Not on file  Social Connections: Not on file  Intimate Partner Violence: Not on file    FAMILY HISTORY:  Family History  Problem Relation Age of Onset   Alzheimer's disease Mother    CAD Father    Diabetes Mellitus II Father    Alzheimer's disease Father    Diabetes Mellitus II Sister    Colon cancer Sister 83       small, surgical excision; chemo/rad not needed   Cancer - Other Brother    Diabetes Mellitus II Brother    Hypertension Brother    Liver disease Neg Hx     CURRENT MEDICATIONS:  Outpatient Encounter Medications as of 10/09/2022  Medication Sig   Accu-Chek Softclix Lancets lancets USE TO CHECK BLOOD SUGAR FOUR TIMES DAILY. Dx: E11.65   Cholecalciferol (VITAMIN D) 2000 units tablet Take 2,000 Units by mouth daily.   Continuous Blood Gluc Receiver  (FREESTYLE LIBRE 2 READER) DEVI As directed   Continuous Blood Gluc Sensor (FREESTYLE LIBRE 2 SENSOR) MISC 1 Piece by Does not apply route every 14 (fourteen) days.   insulin degludec (TRESIBA FLEXTOUCH) 200 UNIT/ML FlexTouch Pen Inject 40 Units into the skin at bedtime.   lisinopril (PRINIVIL,ZESTRIL) 10 MG tablet Take 10 mg by mouth daily.   NOVOFINE 32G X 6 MM MISC USE ONE AS DIRECTED TWICE DAILY.   OZEMPIC, 2 MG/DOSE, 8 MG/3ML SOPN INJECT '2MG'$  UNDER THE SKIN EVERY WEEK   pantoprazole (PROTONIX) 40 MG tablet Take 1 tablet (40 mg total) by mouth 2 (two) times daily before a meal.   propranolol (INDERAL) 10 MG tablet TAKE THREE (3) TABLETS BY MOUTH EVERY MORNING AND 3 TABLETS EVERY EVENING   No facility-administered encounter medications on file as of 10/09/2022.    ALLERGIES:  Allergies  Allergen Reactions   Codeine Nausea And Vomiting    LABORATORY DATA:  I have reviewed the labs as listed.  CBC    Component Value Date/Time   WBC 4.7 10/02/2022 1053   RBC 3.72 (L) 10/02/2022 1053   HGB 11.9 (L) 10/02/2022 1053   HCT 35.3 (L) 10/02/2022 1053   PLT 65 (L) 10/02/2022 1053   MCV 94.9 10/02/2022 1053   MCH 32.0 10/02/2022 1053   MCHC 33.7 10/02/2022 1053   RDW 14.7 10/02/2022 1053   LYMPHSABS 0.9 10/02/2022 1053   MONOABS 0.4 10/02/2022 1053   EOSABS 0.2 10/02/2022 1053   BASOSABS 0.0 10/02/2022 1053      Latest Ref Rng & Units 10/02/2022   10:53 AM 08/10/2022   11:51 AM 03/26/2022    3:18 PM  CMP  Glucose 70 - 99 mg/dL 80  160  116   BUN 8 - 23 mg/dL '17  19  14   '$ Creatinine 0.44 - 1.00 mg/dL 1.73  1.76  1.65   Sodium 135 - 145 mmol/L 137  136  137   Potassium 3.5 - 5.1 mmol/L 4.0  4.5  4.1   Chloride 98 - 111 mmol/L 104  111  110   CO2 22 - 32 mmol/L '27  21  22   '$ Calcium 8.9 -  10.3 mg/dL 8.8  8.8  8.7   Total Protein 6.5 - 8.1 g/dL 6.5     Total Bilirubin 0.3 - 1.2 mg/dL 3.1     Alkaline Phos 38 - 126 U/L 80     AST 15 - 41 U/L 36     ALT 0 - 44 U/L 15        DIAGNOSTIC IMAGING:  I have independently reviewed the relevant imaging and discussed with the patient.   WRAP UP:  All questions were answered. The patient knows to call the clinic with any problems, questions or concerns.  Medical decision making: ***  Time spent on visit: I spent *** minutes counseling the patient face to face. The total time spent in the appointment was *** minutes and more than 50% was on counseling.  [name], PA-C  ***

## 2022-10-09 ENCOUNTER — Inpatient Hospital Stay: Payer: Medicare PPO | Admitting: Physician Assistant

## 2022-10-13 ENCOUNTER — Ambulatory Visit: Payer: Medicare PPO | Admitting: Internal Medicine

## 2022-10-16 DIAGNOSIS — R918 Other nonspecific abnormal finding of lung field: Secondary | ICD-10-CM | POA: Diagnosis not present

## 2022-10-19 DIAGNOSIS — K7469 Other cirrhosis of liver: Secondary | ICD-10-CM | POA: Diagnosis not present

## 2022-10-19 DIAGNOSIS — R918 Other nonspecific abnormal finding of lung field: Secondary | ICD-10-CM | POA: Diagnosis not present

## 2022-10-19 DIAGNOSIS — C220 Liver cell carcinoma: Secondary | ICD-10-CM | POA: Diagnosis not present

## 2022-10-19 DIAGNOSIS — R911 Solitary pulmonary nodule: Secondary | ICD-10-CM | POA: Diagnosis not present

## 2022-10-27 ENCOUNTER — Other Ambulatory Visit (HOSPITAL_COMMUNITY)
Admission: RE | Admit: 2022-10-27 | Discharge: 2022-10-27 | Disposition: A | Discharge status: Home / Self Care | Payer: Medicare PPO | Source: Ambulatory Visit | Attending: "Endocrinology | Admitting: "Endocrinology

## 2022-10-27 DIAGNOSIS — E559 Vitamin D deficiency, unspecified: Secondary | ICD-10-CM | POA: Insufficient documentation

## 2022-10-27 DIAGNOSIS — Z794 Long term (current) use of insulin: Secondary | ICD-10-CM | POA: Diagnosis not present

## 2022-10-27 DIAGNOSIS — E782 Mixed hyperlipidemia: Secondary | ICD-10-CM | POA: Diagnosis not present

## 2022-10-27 DIAGNOSIS — E1159 Type 2 diabetes mellitus with other circulatory complications: Secondary | ICD-10-CM | POA: Insufficient documentation

## 2022-10-27 LAB — LIPID PANEL
Cholesterol: 157 mg/dL (ref 0–200)
HDL: 43 mg/dL (ref >40)
LDL Cholesterol: 94 mg/dL (ref 0–99)
Total CHOL/HDL Ratio: 3.7 RATIO
Triglycerides: 98 mg/dL (ref <150)
VLDL: 20 mg/dL (ref 0–40)

## 2022-10-27 LAB — COMPREHENSIVE METABOLIC PANEL
ALT: 16 U/L (ref 0–44)
AST: 35 U/L (ref 15–41)
Albumin: 3.1 g/dL — ABNORMAL LOW (ref 3.5–5.0)
Alkaline Phosphatase: 78 U/L (ref 38–126)
Anion gap: 7 (ref 5–15)
BUN: 14 mg/dL (ref 8–23)
CO2: 23 mmol/L (ref 22–32)
Calcium: 8.7 mg/dL — ABNORMAL LOW (ref 8.9–10.3)
Chloride: 108 mmol/L (ref 98–111)
Creatinine, Ser: 1.6 mg/dL — ABNORMAL HIGH (ref 0.44–1.00)
GFR, Estimated: 34 mL/min — ABNORMAL LOW (ref >60)
Glucose, Bld: 97 mg/dL (ref 70–99)
Potassium: 3.9 mmol/L (ref 3.5–5.1)
Sodium: 138 mmol/L (ref 135–145)
Total Bilirubin: 3.1 mg/dL — ABNORMAL HIGH (ref 0.3–1.2)
Total Protein: 6.2 g/dL — ABNORMAL LOW (ref 6.5–8.1)

## 2022-10-27 LAB — T4, FREE: Free T4: 1.12 ng/dL (ref 0.61–1.12)

## 2022-10-27 LAB — TSH: TSH: 2.448 u[IU]/mL (ref 0.350–4.500)

## 2022-10-28 LAB — MISC LABCORP TEST (SEND OUT): Labcorp test code: 81950

## 2022-10-28 NOTE — Progress Notes (Unsigned)
[place], [place] [address]   CLINIC:  Medical Oncology/Hematology  PCP:  Glenda Chroman, MD [place] [address] [phone]   REASON FOR VISIT:  Follow-up for iron deficiency anemia (intermittent GI bleeding) and thrombocytopenia (cirrhosis)   CURRENT THERAPY: Intermittent IV iron infusions   INTERVAL HISTORY:   Ms. Poirrier 72 y.o. female returns for routine follow-up of her iron deficiency anemia (intermittent GI bleeding) and thrombocytopenia (cirrhosis/splenomegaly).  She was last seen by Tarri Abernethy PA-C on 03/31/2022   At today's visit, she reports feeling ***.  No recent hospitalizations, surgeries, or changes in baseline health status.   *** She continues to follow at [facility] for her liver cirrhosis, HCC lesion, and lung mass.   *** Her chronic fatigue is at baseline. *** She has not had any GI bleeding events or esophageal banding since her last visit. *** She denies any recent signs of bleeding such as hematemesis, hematochezia, melena, or epistaxis. *** She denies any significant bruising or petechial rash. *** She has not noticed any pica, restless legs, headache, chest pain, dyspnea on exertion, lightheadedness, or syncope. *** No B symptoms such as fever, chills, night sweats, unintentional weight loss.   She has ***% energy and ***% appetite. She endorses that she is maintaining a stable weight.     ASSESSMENT & PLAN:  1.  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  Iron saturation 45%.  Ferritin 218. -  Denies recent bleeding events, no melena or bright red blood per rectum***    - PLAN:  No indication for IV iron supplementation at this time. - Repeat labs and RTC in 6 months, or sooner if indicated.   2.  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   3.  Vitamin B12 deficiency - Most recent labs (10/02/2022) show B12 at 587 with normal methylmalonic acid - Patient is currently not taking any vitamin B12 supplements (these were stopped by her endocrinologist) - PLAN: Recheck vitamin B12 and methylmalonic acid in 6 months.   4.  CKD stage IIIb/IV - Creatinine stable at baseline - Follows with Dr. Theador Hawthorne   5.  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 Gala Romney, who she follows with for local/general GI care   6.  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  Other history - PMH: Cirrhosis, chronic kidney disease, congestive heart failure, type 2 diabetes, hypertension - SOCIAL: Lifelong non-smoker.  No  tobacco, alcohol, illicit drug use.  PLAN SUMMARY: >> *** >> *** >> ***   [facility] **   You were seen today by Tarri Abernethy PA-C for your ***.    *** ***  *** ***  LABS: Return in ***   OTHER TESTS: ***  MEDICATIONS: ***  FOLLOW-UP APPOINTMENT: ***     REVIEW OF SYSTEMS: ***  Review of Systems - Oncology   PHYSICAL EXAM:  ECOG PERFORMANCE STATUS: {CHL ONC ECOG [id number] *** There were no vitals filed for this visit. There were no vitals filed for this visit. Physical Exam  PAST MEDICAL/SURGICAL HISTORY:  Past Medical History:  Diagnosis Date   B12 deficiency 11/03/2016   B12 deficiency 11/03/2016   Barrett's esophagus    Cirrhosis of liver without mention of alcohol    hep B surface antigen and HCV ab negative.pt has not had hepatitis A and B vaccines.U/S on 07/04/13 shows cirrhosis.   Diverticula of colon    pancolonic   Esophageal reflux    Esophageal varices (HCC)    Hiatal hernia    Iron deficiency anemia, unspecified    Other and unspecified hyperlipidemia    Portal hypertensive gastropathy (HCC)    Tubular adenoma    Type II or unspecified type diabetes mellitus without mention of complication, not stated as uncontrolled    Unspecified essential hypertension    Unspecified hemorrhoids without mention of complication    Past Surgical History:  Procedure Laterality Date   ABDOMINAL HYSTERECTOMY     BIOPSY  04/30/2016   Procedure: BIOPSY;  Surgeon: Daneil Dolin, MD;  [facility];  Service: Endoscopy;;  esophagus   BIOPSY  02/24/2019   Procedure: BIOPSY;  Surgeon: Daneil Dolin, MD;  [facility];  Service: Endoscopy;;   CESAREAN SECTION     x 2   COLONOSCOPY  03/2006   left sided diverticula, splenic flexure tublar adenoma   COLONOSCOPY  07/2002   villous tubular adenoma in rectum   COLONOSCOPY  09/23/09   external  hemorrhoids/scattered pan colonic diverticula otherwise normal. cecal lipoma bx negative, normal TI. Next TCS 09/2014   COLONOSCOPY N/A 11/12/2014   Procedure: COLONOSCOPY;  Surgeon: Daneil Dolin, MD;  [facility];  Service: Endoscopy;  Laterality: N/A;  1215 - moved to 12:30 - Ginger to notify pt   COLONOSCOPY N/A 01/07/2017   Rourk: Grade 4 internal hemorrhoids, 5 mm ulcer in the sigmoid colon (benign bx), 7 mm polyp removed from the cecum (tubular adenoma), diverticulosis.next tcs in five years   COLONOSCOPY WITH PROPOFOL N/A 04/01/2022   Procedure: COLONOSCOPY WITH PROPOFOL;  Surgeon: Daneil Dolin, MD;  [facility];  Service: Endoscopy;  Laterality: N/A;  8:00am   ESOPHAGEAL BANDING N/A 12/20/2016   Procedure: ESOPHAGEAL BANDING;  Surgeon: Danie Binder, MD;  [facility];  Service: Endoscopy;  Laterality: N/A;   ESOPHAGEAL BANDING N/A 03/18/2017   Procedure: ESOPHAGEAL BANDING;  Surgeon: Daneil Dolin, MD;  [facility];  Service: Endoscopy;  Laterality: N/A;  ESOPHAGEAL BANDING  12/17/2017   Procedure: ESOPHAGEAL BANDING;  Surgeon: Daneil Dolin, MD;  [facility];  Service: Endoscopy;;   ESOPHAGEAL BANDING N/A 07/03/2020   Procedure: ESOPHAGEAL BANDING;  Surgeon: Daneil Dolin, MD;  [facility];  Service: Endoscopy;  Laterality: N/A;   ESOPHAGEAL BANDING  04/01/2022   Procedure: ESOPHAGEAL BANDING;  Surgeon: Daneil Dolin, MD;  [facility];  Service: Endoscopy;;   ESOPHAGOGASTRODUODENOSCOPY  08/2006   barretts esophagus, no dyplasia   ESOPHAGOGASTRODUODENOSCOPY  03/2006   barretts esophagus,bx focal atypia c/w low grade dysplasia, SB bx negative for celiac   ESOPHAGOGASTRODUODENOSCOPY  09/23/09   3 columns of grade 2 esophageal varices/hiatal hernia/3-4 cm segment Barrett's esophagus without dysplasia. Next EGD 09/2012   ESOPHAGOGASTRODUODENOSCOPY N/A 11/11/2012   Dr. Gala Romney- Barrett;s esophagus, esophageal  varices, portal gastopathy. hiatal hernia   ESOPHAGOGASTRODUODENOSCOPY N/A 04/30/2016   Procedure: ESOPHAGOGASTRODUODENOSCOPY (EGD);  Surgeon: Daneil Dolin, MD;  [facility];  Service: Endoscopy;  Laterality: N/A;  815   ESOPHAGOGASTRODUODENOSCOPY N/A 12/20/2016   Procedure: ESOPHAGOGASTRODUODENOSCOPY (EGD);  Surgeon: Danie Binder, MD;  [facility];  Service: Endoscopy;  Laterality: N/A;   ESOPHAGOGASTRODUODENOSCOPY N/A 01/07/2017   Procedure: ESOPHAGOGASTRODUODENOSCOPY (EGD);  Surgeon: Daneil Dolin, MD;  [facility];  Service: Endoscopy;  Laterality: N/A;   ESOPHAGOGASTRODUODENOSCOPY N/A 03/18/2017   Procedure: ESOPHAGOGASTRODUODENOSCOPY (EGD);  Surgeon: Daneil Dolin, MD;  [facility];  Service: Endoscopy;  Laterality: N/A;  1030    ESOPHAGOGASTRODUODENOSCOPY N/A 12/17/2017   Procedure: ESOPHAGOGASTRODUODENOSCOPY (EGD);  Surgeon: Daneil Dolin, MD;  [facility];  Service: Endoscopy;  Laterality: N/A;  10:30am   ESOPHAGOGASTRODUODENOSCOPY N/A 02/24/2019   Rourk: No esophageal varices seen, scars noted from prior banding.  5 cm segment of salmon-colored epithelium coming up from the GE junction consistent with Barrett's (bx with no dysplasia).  Portal gastropathy.  Small hiatal hernia.  Recommended repeat EGD in 18 months.   ESOPHAGOGASTRODUODENOSCOPY N/A 07/03/2020   Procedure: ESOPHAGOGASTRODUODENOSCOPY (EGD);  Surgeon: Daneil Dolin, MD;  [facility];  Service: Endoscopy;  Laterality: N/A;  10:15am   ESOPHAGOGASTRODUODENOSCOPY (EGD) WITH PROPOFOL N/A 04/01/2022   Procedure: ESOPHAGOGASTRODUODENOSCOPY (EGD) WITH PROPOFOL;  Surgeon: Daneil Dolin, MD;  [facility];  Service: Endoscopy;  Laterality: N/A;   [place]  2008   POLYPECTOMY  04/01/2022   Procedure: POLYPECTOMY;  Surgeon: Daneil Dolin, MD;  [facility];  Service: Endoscopy;;  ascending and descending   small bowel capsule  endoscopy  10/2009   normal    SOCIAL HISTORY:  Social History   Socioeconomic History   Marital status: Married    Spouse name: Not on file   Number of children: Not on file   Years of education: Not on file   Highest education level: Not on file  Occupational History   Not on file  Tobacco Use   Smoking status: Never   Smokeless tobacco: Never  Vaping Use   Vaping Use: Never used  Substance and Sexual Activity   Alcohol use: No   Drug use: No   Sexual activity: Yes  Other Topics Concern   Not on file  Social History Narrative   Not on file   Social Determinants of Health   Financial Resource Strain: Low Risk  (09/11/2022)   Overall Financial Resource Strain (CARDIA)    Difficulty of Paying Living Expenses: Not hard at all  Food Insecurity: No Food Insecurity (04/16/2022)   Hunger Vital Sign    Worried About Running Out of Food in the Last Year: Never true    Ran Out of Food in the Last Year: Never true  Transportation Needs: No Transportation Needs (09/11/2022)   PRAPARE - Hydrologist (Medical): No    Lack of Transportation (Non-Medical): No  Physical Activity: Not on file  Stress: Not on file  Social Connections: Not on file  Intimate Partner Violence: Not on file    FAMILY HISTORY:  Family History  Problem Relation Age of Onset   Alzheimer's disease Mother    CAD Father    Diabetes Mellitus II Father    Alzheimer's disease Father    Diabetes Mellitus II Sister    Colon cancer Sister 34       small, surgical excision; chemo/rad not needed   Cancer - Other Brother    Diabetes Mellitus II Brother    Hypertension Brother    Liver disease Neg Hx     CURRENT MEDICATIONS:  Outpatient Encounter Medications as of 10/29/2022  Medication Sig   Accu-Chek Softclix Lancets lancets USE TO CHECK BLOOD SUGAR FOUR TIMES DAILY. Dx: E11.65   Cholecalciferol (VITAMIN D) 2000 units tablet Take 2,000 Units by mouth daily.   Continuous Blood  Gluc Receiver (FREESTYLE LIBRE 2 READER) DEVI As directed   Continuous Blood Gluc Sensor (FREESTYLE LIBRE 2 SENSOR) MISC 1 Piece by Does not apply route every 14 (fourteen) days.   insulin degludec (TRESIBA FLEXTOUCH) 200 UNIT/ML FlexTouch Pen Inject 40 Units into the skin at bedtime.   lisinopril (PRINIVIL,ZESTRIL) 10 MG tablet Take 10 mg by mouth daily.   NOVOFINE 32G X 6 MM MISC USE ONE AS DIRECTED TWICE DAILY.   OZEMPIC, 2 MG/DOSE, 8 MG/3ML SOPN INJECT 2MG UNDER THE SKIN EVERY WEEK   pantoprazole (PROTONIX) 40 MG tablet Take 1 tablet (40 mg total) by mouth 2 (two) times daily before a meal.   propranolol (INDERAL) 10 MG tablet TAKE THREE (3) TABLETS BY MOUTH EVERY MORNING AND 3 TABLETS EVERY EVENING   No facility-administered encounter medications on file as of 10/29/2022.    ALLERGIES:  Allergies  Allergen Reactions   Codeine Nausea And Vomiting    LABORATORY DATA:  I have reviewed the labs as listed.  CBC    Component Value Date/Time   WBC 4.7 10/02/2022 1053   RBC 3.72 (L) 10/02/2022 1053   HGB 11.9 (L) 10/02/2022 1053   HCT 35.3 (L) 10/02/2022 1053   PLT 65 (L) 10/02/2022 1053   MCV 94.9 10/02/2022 1053   MCH 32.0 10/02/2022 1053   MCHC 33.7 10/02/2022 1053   RDW 14.7 10/02/2022 1053   LYMPHSABS 0.9 10/02/2022 1053   MONOABS 0.4 10/02/2022 1053   EOSABS 0.2 10/02/2022 1053   BASOSABS 0.0 10/02/2022 1053      Latest Ref Rng & Units 10/27/2022   11:52 AM 10/02/2022   10:53 AM 08/10/2022   11:51 AM  CMP  Glucose 70 - 99 mg/dL 97  80  160   BUN 8 - 23 mg/dL 14  17  19   $ Creatinine 0.44 - 1.00 mg/dL 1.60  1.73  1.76   Sodium 135 - 145 mmol/L 138  137  136   Potassium 3.5 - 5.1 mmol/L 3.9  4.0  4.5   Chloride 98 - 111 mmol/L 108  104  111   CO2 22 - 32 mmol/L 23  27  21   Calcium 8.9 - 10.3 mg/dL 8.7  8.8  8.8   Total Protein 6.5 - 8.1 g/dL 6.2  6.5    Total Bilirubin 0.3 - 1.2 mg/dL 3.1  3.1    Alkaline Phos 38 - 126 U/L 78  80    AST 15 - 41 U/L 35  36    ALT 0  - 44 U/L 16  15      DIAGNOSTIC IMAGING:  I have independently reviewed the relevant imaging and discussed with the patient.   WRAP UP:  All questions were answered. The patient knows to call the clinic with any problems, questions or concerns.  Medical decision making: ***  Time spent on visit: I spent *** minutes counseling the patient face to face. The total time spent in the appointment was *** minutes and more than 50% was on counseling.  [name], PA-C  ***

## 2022-10-29 ENCOUNTER — Inpatient Hospital Stay: Payer: Medicare PPO | Admitting: Physician Assistant

## 2022-11-03 ENCOUNTER — Encounter: Payer: Self-pay | Admitting: "Endocrinology

## 2022-11-03 ENCOUNTER — Ambulatory Visit: Payer: Medicare PPO | Admitting: "Endocrinology

## 2022-11-03 VITALS — BP 110/56 | HR 68 | Ht 60.0 in | Wt 172.8 lb

## 2022-11-03 DIAGNOSIS — I10 Essential (primary) hypertension: Secondary | ICD-10-CM

## 2022-11-03 DIAGNOSIS — E782 Mixed hyperlipidemia: Secondary | ICD-10-CM | POA: Diagnosis not present

## 2022-11-03 DIAGNOSIS — Z789 Other specified health status: Secondary | ICD-10-CM

## 2022-11-03 DIAGNOSIS — E1169 Type 2 diabetes mellitus with other specified complication: Secondary | ICD-10-CM

## 2022-11-03 DIAGNOSIS — Z794 Long term (current) use of insulin: Secondary | ICD-10-CM | POA: Diagnosis not present

## 2022-11-03 LAB — POCT GLYCOSYLATED HEMOGLOBIN (HGB A1C): HbA1c, POC (controlled diabetic range): 5 % (ref 0.0–7.0)

## 2022-11-03 MED ORDER — TRESIBA FLEXTOUCH 200 UNIT/ML ~~LOC~~ SOPN: 20.0000 [IU] | PEN_INJECTOR | Freq: Every day | SUBCUTANEOUS | 0 refills | Status: DC

## 2022-11-03 NOTE — Patient Instructions (Signed)

## 2022-11-03 NOTE — Progress Notes (Signed)
11/03/2022, 11:52 AM  Endocrinology follow-up note   Subjective:    Patient ID: Alisha [name], female    DOB: 05-04-1951.  Alisha [name] is being seen in follow-up after she was seen in consultation for management of currently uncontrolled symptomatic diabetes requested by  Glenda Chroman, MD.   Past Medical History:  Diagnosis Date   B12 deficiency 11/03/2016   B12 deficiency 11/03/2016   Barrett's esophagus    Cirrhosis of liver without mention of alcohol    hep B surface antigen and HCV ab negative.pt has not had hepatitis A and B vaccines.U/S on 07/04/13 shows cirrhosis.   Diverticula of colon    pancolonic   Esophageal reflux    Esophageal varices (HCC)    Hiatal hernia    Iron deficiency anemia, unspecified    Other and unspecified hyperlipidemia    Portal hypertensive gastropathy (HCC)    Tubular adenoma    Type II or unspecified type diabetes mellitus without mention of complication, not stated as uncontrolled    Unspecified essential hypertension    Unspecified hemorrhoids without mention of complication     Past Surgical History:  Procedure Laterality Date   ABDOMINAL HYSTERECTOMY     BIOPSY  04/30/2016   Procedure: BIOPSY;  Surgeon: Daneil Dolin, MD;  [facility];  Service: Endoscopy;;  esophagus   BIOPSY  02/24/2019   Procedure: BIOPSY;  Surgeon: Daneil Dolin, MD;  [facility];  Service: Endoscopy;;   CESAREAN SECTION     x 2   COLONOSCOPY  03/2006   left sided diverticula, splenic flexure tublar adenoma   COLONOSCOPY  07/2002   villous tubular adenoma in rectum   COLONOSCOPY  09/23/09   external hemorrhoids/scattered pan colonic diverticula otherwise normal. cecal lipoma bx negative, normal TI. Next TCS 09/2014   COLONOSCOPY N/A 11/12/2014   Procedure: COLONOSCOPY;  Surgeon: Daneil Dolin, MD;  [facility];  Service: Endoscopy;   Laterality: N/A;  1215 - moved to 12:30 - Ginger to notify pt   COLONOSCOPY N/A 01/07/2017   Rourk: Grade 4 internal hemorrhoids, 5 mm ulcer in the sigmoid colon (benign bx), 7 mm polyp removed from the cecum (tubular adenoma), diverticulosis.next tcs in five years   COLONOSCOPY WITH PROPOFOL N/A 04/01/2022   Procedure: COLONOSCOPY WITH PROPOFOL;  Surgeon: Daneil Dolin, MD;  [facility];  Service: Endoscopy;  Laterality: N/A;  8:00am   ESOPHAGEAL BANDING N/A 12/20/2016   Procedure: ESOPHAGEAL BANDING;  Surgeon: Danie Binder, MD;  [facility];  Service: Endoscopy;  Laterality: N/A;   ESOPHAGEAL BANDING N/A 03/18/2017   Procedure: ESOPHAGEAL BANDING;  Surgeon: Daneil Dolin, MD;  [facility];  Service: Endoscopy;  Laterality: N/A;   ESOPHAGEAL BANDING  12/17/2017   Procedure: ESOPHAGEAL BANDING;  Surgeon: Daneil Dolin, MD;  [facility];  Service: Endoscopy;;   ESOPHAGEAL BANDING N/A 07/03/2020   Procedure: ESOPHAGEAL BANDING;  Surgeon: Daneil Dolin, MD;  [facility];  Service: Endoscopy;  Laterality: N/A;   ESOPHAGEAL BANDING  04/01/2022   Procedure: ESOPHAGEAL BANDING;  Surgeon:  Daneil Dolin, MD;  [facility];  Service: Endoscopy;;   ESOPHAGOGASTRODUODENOSCOPY  08/2006   barretts esophagus, no dyplasia   ESOPHAGOGASTRODUODENOSCOPY  03/2006   barretts esophagus,bx focal atypia c/w low grade dysplasia, SB bx negative for celiac   ESOPHAGOGASTRODUODENOSCOPY  09/23/09   3 columns of grade 2 esophageal varices/hiatal hernia/3-4 cm segment Barrett's esophagus without dysplasia. Next EGD 09/2012   ESOPHAGOGASTRODUODENOSCOPY N/A 11/11/2012   Dr. Gala Romney- Barrett;s esophagus, esophageal varices, portal gastopathy. hiatal hernia   ESOPHAGOGASTRODUODENOSCOPY N/A 04/30/2016   Procedure: ESOPHAGOGASTRODUODENOSCOPY (EGD);  Surgeon: Daneil Dolin, MD;  [facility];  Service: Endoscopy;  Laterality: N/A;  815    ESOPHAGOGASTRODUODENOSCOPY N/A 12/20/2016   Procedure: ESOPHAGOGASTRODUODENOSCOPY (EGD);  Surgeon: Danie Binder, MD;  [facility];  Service: Endoscopy;  Laterality: N/A;   ESOPHAGOGASTRODUODENOSCOPY N/A 01/07/2017   Procedure: ESOPHAGOGASTRODUODENOSCOPY (EGD);  Surgeon: Daneil Dolin, MD;  [facility];  Service: Endoscopy;  Laterality: N/A;   ESOPHAGOGASTRODUODENOSCOPY N/A 03/18/2017   Procedure: ESOPHAGOGASTRODUODENOSCOPY (EGD);  Surgeon: Daneil Dolin, MD;  [facility];  Service: Endoscopy;  Laterality: N/A;  1030    ESOPHAGOGASTRODUODENOSCOPY N/A 12/17/2017   Procedure: ESOPHAGOGASTRODUODENOSCOPY (EGD);  Surgeon: Daneil Dolin, MD;  [facility];  Service: Endoscopy;  Laterality: N/A;  10:30am   ESOPHAGOGASTRODUODENOSCOPY N/A 02/24/2019   Rourk: No esophageal varices seen, scars noted from prior banding.  5 cm segment of salmon-colored epithelium coming up from the GE junction consistent with Barrett's (bx with no dysplasia).  Portal gastropathy.  Small hiatal hernia.  Recommended repeat EGD in 18 months.   ESOPHAGOGASTRODUODENOSCOPY N/A 07/03/2020   Procedure: ESOPHAGOGASTRODUODENOSCOPY (EGD);  Surgeon: Daneil Dolin, MD;  [facility];  Service: Endoscopy;  Laterality: N/A;  10:15am   ESOPHAGOGASTRODUODENOSCOPY (EGD) WITH PROPOFOL N/A 04/01/2022   Procedure: ESOPHAGOGASTRODUODENOSCOPY (EGD) WITH PROPOFOL;  Surgeon: Daneil Dolin, MD;  [facility];  Service: Endoscopy;  Laterality: N/A;   [place]  2008   POLYPECTOMY  04/01/2022   Procedure: POLYPECTOMY;  Surgeon: Daneil Dolin, MD;  [facility];  Service: Endoscopy;;  ascending and descending   small bowel capsule endoscopy  10/2009   normal    Social History   Socioeconomic History   Marital status: Married    Spouse name: Not on file   Number of children: Not on file   Years of education: Not on file   Highest education level: Not on  file  Occupational History   Not on file  Tobacco Use   Smoking status: Never   Smokeless tobacco: Never  Vaping Use   Vaping Use: Never used  Substance and Sexual Activity   Alcohol use: No   Drug use: No   Sexual activity: Yes  Other Topics Concern   Not on file  Social History Narrative   Not on file   Social Determinants of Health   Financial Resource Strain: Low Risk  (09/11/2022)   Overall Financial Resource Strain (CARDIA)    Difficulty of Paying Living Expenses: Not hard at all  Food Insecurity: No Food Insecurity (04/16/2022)   Hunger Vital Sign    Worried About Running Out of Food in the Last Year: Never true    Ran Out of Food in the Last Year: Never true  Transportation Needs: No Transportation Needs (09/11/2022)   PRAPARE - Hydrologist (Medical): No    Lack of Transportation (Non-Medical): No  Physical Activity: Not on file  Stress: Not on file  Social Connections: Not on file    Family History  Problem Relation Age of Onset   Alzheimer's disease Mother    CAD Father    Diabetes Mellitus II Father    Alzheimer's disease Father    Diabetes Mellitus II Sister    Colon cancer Sister 15       small, surgical excision; chemo/rad not needed   Cancer - Other Brother    Diabetes Mellitus II Brother    Hypertension Brother    Liver disease Neg Hx     Outpatient Encounter Medications as of 11/03/2022  Medication Sig   Accu-Chek Softclix Lancets lancets USE TO CHECK BLOOD SUGAR FOUR TIMES DAILY. Dx: E11.65   Cholecalciferol (VITAMIN D) 2000 units tablet Take 2,000 Units by mouth daily.   Continuous Blood Gluc Receiver (FREESTYLE LIBRE 2 READER) DEVI As directed   Continuous Blood Gluc Sensor (FREESTYLE LIBRE 2 SENSOR) MISC 1 Piece by Does not apply route every 14 (fourteen) days.   insulin degludec (TRESIBA FLEXTOUCH) 200 UNIT/ML FlexTouch Pen Inject 20 Units into the skin at bedtime.   lisinopril (PRINIVIL,ZESTRIL) 10 MG tablet  Take 10 mg by mouth daily.   NOVOFINE 32G X 6 MM MISC USE ONE AS DIRECTED TWICE DAILY.   OZEMPIC, 2 MG/DOSE, 8 MG/3ML SOPN INJECT 2MG UNDER THE SKIN EVERY WEEK   pantoprazole (PROTONIX) 40 MG tablet Take 1 tablet (40 mg total) by mouth 2 (two) times daily before a meal.   propranolol (INDERAL) 10 MG tablet TAKE THREE (3) TABLETS BY MOUTH EVERY MORNING AND 3 TABLETS EVERY EVENING   [DISCONTINUED] insulin degludec (TRESIBA FLEXTOUCH) 200 UNIT/ML FlexTouch Pen Inject 40 Units into the skin at bedtime.   No facility-administered encounter medications on file as of 11/03/2022.    ALLERGIES: Allergies  Allergen Reactions   Codeine Nausea And Vomiting    VACCINATION STATUS:  There is no immunization history on file for this patient.  Diabetes She presents for her follow-up diabetic visit. She has type 2 diabetes mellitus. Onset time: She was diagnosed at approximate age of 67 years. Her disease course has been improving. There are no hypoglycemic associated symptoms. Pertinent negatives for hypoglycemia include no confusion, headaches, pallor or seizures. Pertinent negatives for diabetes include no chest pain, no fatigue, no polydipsia, no polyphagia and no polyuria. (She did not bring any logs nor meter to review.  Patient verbally reports that she is having fluctuating glycemic profile including hypoglycemia.) There are no hypoglycemic complications. Symptoms are improving. Diabetic complications include nephropathy. Risk factors for coronary artery disease include diabetes mellitus, hypertension, obesity, post-menopausal and family history. Her weight is decreasing steadily. She is following a generally unhealthy diet. When asked about meal planning, she reported none. She has had a previous visit with a dietitian. She participates in exercise intermittently. Her home blood glucose trend is decreasing steadily. Her breakfast blood glucose range is generally 110-130 mg/dl. Her lunch blood glucose  range is generally 110-130 mg/dl. Her dinner blood glucose range is generally 110-130 mg/dl. Her bedtime blood glucose range is generally 110-130 mg/dl. Her overall blood glucose range is 110-130 mg/dl. Alisha [name] presents with continued significant improvement in her glycemic profile.  Her CGM was downloaded and analyzed.  Her AGP report shows 93% time in range, 6% level 1 hypoglycemia.  Her point-of-care A1c remains 5%.   ) An ACE inhibitor/angiotensin II receptor blocker is being taken. Eye exam is current.  Hypertension This  is a chronic problem. The current episode started more than 1 year ago. The problem is controlled. Pertinent negatives include no chest pain, headaches, palpitations or shortness of breath. Risk factors for coronary artery disease include diabetes mellitus, family history, obesity, dyslipidemia, sedentary lifestyle and post-menopausal state. Past treatments include ACE inhibitors. Hypertensive end-organ damage includes kidney disease. Identifiable causes of hypertension include chronic renal disease.     Review of Systems  Constitutional:  Negative for chills, fatigue, fever and unexpected weight change.  HENT:  Negative for trouble swallowing and voice change.   Eyes:  Negative for visual disturbance.  Respiratory:  Negative for cough, shortness of breath and wheezing.   Cardiovascular:  Negative for chest pain, palpitations and leg swelling.  Gastrointestinal:  Negative for diarrhea, nausea and vomiting.  Endocrine: Negative for cold intolerance, heat intolerance, polydipsia, polyphagia and polyuria.  Musculoskeletal:  Negative for arthralgias and myalgias.  Skin:  Negative for color change, pallor, rash and wound.  Neurological:  Negative for seizures and headaches.  Psychiatric/Behavioral:  Negative for confusion and suicidal ideas.     Objective:       11/03/2022   11:08 AM 08/03/2022   10:23 AM 04/14/2022   11:09 AM  Vitals with BMI  Height 5' 0"$  5' 0"$  5' 0"$    Weight 172 lbs 13 oz 174 lbs 190 lbs 13 oz  BMI 33.75 [id number]  Systolic [id number]  Diastolic 56 56 58  Pulse 68 72 72    BP (!) 110/56   Pulse 68   Ht 5' (1.524 m)   Wt 172 lb 12.8 oz (78.4 kg)   BMI 33.75 kg/m   Wt Readings from Last 3 Encounters:  11/03/22 172 lb 12.8 oz (78.4 kg)  08/03/22 174 lb (78.9 kg)  04/14/22 190 lb 12.8 oz (86.5 kg)      CMP ( most recent) CMP     Component Value Date/Time   NA 138 10/27/2022 1152   K 3.9 10/27/2022 1152   CL 108 10/27/2022 1152   CO2 23 10/27/2022 1152   GLUCOSE 97 10/27/2022 1152   BUN 14 10/27/2022 1152   BUN 15 01/25/2020 0000   CREATININE 1.60 (H) 10/27/2022 1152   CREATININE 1.62 (H) 03/28/2018 1146   CALCIUM 8.7 (L) 10/27/2022 1152   CALCIUM 8.8 10/16/2021 1354   PROT 6.2 (L) 10/27/2022 1152   ALBUMIN 3.1 (L) 10/27/2022 1152   AST 35 10/27/2022 1152   ALT 16 10/27/2022 1152   ALKPHOS 78 10/27/2022 1152   BILITOT 3.1 (H) 10/27/2022 1152   GFRNONAA 34 (L) 10/27/2022 1152   GFRAA 36 (L) 05/29/2020 1416     Diabetic Labs (most recent): Lab Results  Component Value Date   HGBA1C 5.0 11/03/2022   HGBA1C 5.0 08/03/2022   HGBA1C 9.0 (A) 03/24/2022   MICROALBUR 30 03/24/2022   MICROALBUR 25.8 (H) 12/23/2021   MICROALBUR 9.6 (H) 06/20/2021     Lipid Panel ( most recent) Lipid Panel     Component Value Date/Time   CHOL 157 10/27/2022 1152   TRIG 98 10/27/2022 1152   HDL 43 10/27/2022 1152   CHOLHDL 3.7 10/27/2022 1152   VLDL 20 10/27/2022 1152   LDLCALC 94 10/27/2022 1152      Lab Results  Component Value Date   TSH 2.448 10/27/2022   TSH 2.577 11/24/2021   TSH 2.531 12/20/2020   TSH 2.46 01/25/2020   FREET4 1.12 10/27/2022   FREET4 0.92 11/24/2021   FREET4 0.97 12/20/2020  Assessment & Plan:   1. Uncontrolled type 2 diabetes mellitus with stage 3 chronic kidney disease ([place])  - Alisha [name] has currently uncontrolled symptomatic type 2 DM since 72 years of  age.  Alisha [name] presents with continued significant improvement in her glycemic profile.  Her CGM was downloaded and analyzed.  Her AGP report shows 93% time in range, 6% level 1 hypoglycemia.  Her point-of-care A1c remains 5%.    Recent labs reviewed.  - I had a long discussion with her about the progressive nature of diabetes and the pathology behind its complications. -her diabetes is complicated by CKD and she remains at a high risk for more acute and chronic complications which include CAD, CVA, CKD, retinopathy, and neuropathy. These are all discussed in detail with her.  - I have counseled her on diet  and weight management  by adopting a carbohydrate restricted/protein rich diet. Patient is encouraged to switch to  unprocessed or minimally processed     complex starch and increased protein intake (animal or plant source), fruits, and vegetables. -  she is advised to stick to a routine mealtimes to eat 3 meals  a day and avoid unnecessary snacks ( to snack only to correct hypoglycemia).   Evidently, she is not utilizing her prescribed medication optimally.  She is scans  1-2 on average daily which will not allow her to utilize intensive treatment with basal/bolus insulin.   - she acknowledges that there is a room for improvement in her food and drink choices. - Suggestion is made for her to avoid simple carbohydrates  from her diet including Cakes, Sweet Desserts, Ice Cream, Soda (diet and regular), Sweet Tea, Candies, Chips, Cookies, Store Bought Juices, Alcohol , Artificial Sweeteners,  Coffee Creamer, and "Sugar-free" Products, Lemonade. This will help patient to have more stable blood glucose profile and potentially avoid unintended weight gain.  The following Lifestyle Medicine recommendations according to [facility]) were discussed and and offered to patient and she  agrees to start the journey:  A. Whole Foods, Plant-Based Nutrition comprising of  fruits and vegetables, plant-based proteins, whole-grain carbohydrates was discussed in detail with the patient.   A list for source of those nutrients were also provided to the patient.  Patient will use only water or unsweetened tea for hydration. B.  The need to stay away from risky substances including alcohol, smoking; obtaining 7 to 9 hours of restorative sleep, at least 150 minutes of moderate intensity exercise weekly, the importance of healthy social connections,  and stress management techniques were discussed. C.  A full color page of  Calorie density of various food groups per pound showing examples of each food groups was provided to the patient.   - I have approached her with the following individualized plan to manage  her diabetes and patient agrees:    -She presents with continued engagement and improvement in her glycemic profile.   -Due to tight glycemic profile, she is advised to lower her Tresiba to 20 units nightly, advised to continue to use her CGM continuously.    -She is benefiting from her GLP-1 receptor agonist, advised to continue Ozempic 2 mg subcutaneously weekly.  Side effects and precautions discussed with her. - she is not a candidate for Metformin, due to concurrent renal insufficiency.  - Specific targets for  A1c;  LDL, HDL,  and Triglycerides were discussed with the patient.  2) Blood Pressure /Hypertension:  Her blood pressure is  controlled to target. she is advised to continue her current medications including lisinopril 10 mg p.o. daily with breakfast .   3) Lipids/Hyperlipidemia:   Review of her recent lipid panel showed uncontrolled LDL at 94.     she  is not on statins.  She reports statin intolerance.  Whole food plant-based diet discussed and recommended above will help with dyslipidemia as well.  We considered for fasting lipid panel before her next visit.    4)  Weight/Diet:  Body mass index is 33.75 kg/m.  -Presents kept off the  15 pounds  weight loss,    she is  a candidate for weight loss. I discussed with her the fact that loss of 5 - 10% of her  current body weight will have the most impact on her diabetes management.  Exercise, and detailed carbohydrates information provided  -  detailed on discharge instructions.  5) Chronic Care/Health Maintenance:  -she  is on ACEI Statin medications and  is encouraged to initiate and continue to follow up with Ophthalmology, Dentist,  Podiatrist at least yearly or according to recommendations, and advised to   stay away from smoking. I have recommended yearly flu vaccine and pneumonia vaccine at least every 5 years; moderate intensity exercise for up to 150 minutes weekly; and  sleep for at least 7 hours a day.  6.  Vitamin D deficiency: She is advised to continue cholecalciferol 2000 units daily.   She recently had normal ABI, will be repeated in 5 years.  - she is  advised to maintain close follow up with Glenda Chroman, MD for primary care needs, as well as her other providers for optimal and coordinated care.   I spent  26  minutes in the care of the patient today including review of labs from [place], Lipids, Thyroid Function, Hematology (current and previous including abstractions from other facilities); face-to-face time discussing  her blood glucose readings/logs, discussing hypoglycemia and hyperglycemia episodes and symptoms, medications doses, her options of short and long term treatment based on the latest standards of care / guidelines;  discussion about incorporating lifestyle medicine;  and documenting the encounter. Risk reduction counseling performed per USPSTF guidelines to reduce  obesity and cardiovascular risk factors.     Please refer to Patient Instructions for Blood Glucose Monitoring and Insulin/Medications Dosing Guide"  in media tab for additional information. Please  also refer to " Patient Self Inventory" in the Media  tab for reviewed elements of pertinent patient  history.  Alisha [name] participated in the discussions, expressed understanding, and voiced agreement with the above plans.  All questions were answered to her satisfaction. she is encouraged to contact clinic should she have any questions or concerns prior to her return visit.    Follow up plan: - Return in about 6 months (around 05/04/2023) for Bring Meter/CGM Device/Logs- A1c in Office.  Glade Lloyd, MD [facility] [address] [place], [place] [address] Phone: [phone]  Fax: [phone]    11/03/2022, 11:52 AM  This note was partially dictated with voice recognition software. Similar sounding words can be transcribed inadequately or may not  be corrected upon review.

## 2022-11-08 DIAGNOSIS — E1165 Type 2 diabetes mellitus with hyperglycemia: Secondary | ICD-10-CM | POA: Diagnosis not present

## 2022-11-16 ENCOUNTER — Ambulatory Visit: Payer: Self-pay | Admitting: *Deleted

## 2022-11-16 NOTE — Patient Outreach (Signed)
  Care Coordination   11/16/2022 Name: Alisha [name] MRN: [id number] DOB: 03-05-1951   Care Coordination Outreach Attempts:  An unsuccessful telephone outreach was attempted for a scheduled appointment today.  Follow Up Plan:  Additional outreach attempts will be made to offer the patient care coordination information and services.   Encounter Outcome:  No Answer   Care Coordination Interventions:  No, not indicated    Chong Sicilian, BSN, RN-BC RN Care Coordinator [place]: [phone] Main #: [phone]

## 2022-11-20 DIAGNOSIS — E8940 Asymptomatic postprocedural ovarian failure: Secondary | ICD-10-CM | POA: Diagnosis not present

## 2022-11-23 NOTE — Progress Notes (Unsigned)
[address] [place], [place] [address]   CLINIC:  Medical Oncology/Hematology  PCP:  Glenda Chroman, MD [place] [address] [phone]   REASON FOR VISIT:  Follow-up for iron deficiency anemia (intermittent GI bleeding) and thrombocytopenia (cirrhosis)   CURRENT THERAPY: Intermittent IV iron infusions   INTERVAL HISTORY:   Alisha [name] 72 y.o. female returns for routine follow-up of her iron deficiency anemia (intermittent GI bleeding) and thrombocytopenia (cirrhosis/splenomegaly).  She was last seen by Tarri Abernethy PA-C on 03/31/2022   She continues to follow at [facility] for her liver cirrhosis, HCC lesion, and lung mass.  Since her last visit, she has been seen by interventional pulmonologist (Dr. Ronn Melena and Dr. Williemae Natter) at [facility] for evaluation of her slowly enlarging pulmonary mass in the RML as well as rising AFP suspicious for [facility] (s/p radioembolization with IR).  She was recommended for biopsy via bronchoscopy with mediastinal staging, which patient has for the time being declined while she "gives it some more thought."  Her chronic fatigue is at baseline. She has not had any GI bleeding events or esophageal banding since her last visit.  She denies any recent signs of bleeding such as hematemesis, hematochezia, melena, or epistaxis. She denies any significant bruising or petechial rash.  She has not noticed any pica, restless legs, headache, chest pain, dyspnea on exertion, lightheadedness, or syncope. No B symptoms such as fever, chills, night sweats, unintentional weight loss.   She has 60% energy and 30% appetite. She endorses that she is maintaining a stable weight.     ASSESSMENT & PLAN:  1.  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  Iron saturation 45%.  Ferritin 218. - Denies recent bleeding events, no melena or bright red blood per rectum    - PLAN:  No indication for IV iron supplementation at this time. - Repeat labs and RTC in 6 months, or sooner if indicated.   2.  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 - Will consider bone marrow biopsy if any platelets <50 in order to rule out coexistent bone marrow disorder such as MDS   3.  Vitamin B12 deficiency - Most recent labs (10/02/2022) show B12 at 587 with normal methylmalonic acid - Patient is currently not taking any vitamin B12 supplements (these were stopped by her endocrinologist) - PLAN: Recheck vitamin B12 and methylmalonic acid in 6 months.   4.  CKD stage IIIb/IV - Creatinine stable at baseline - Follows with Dr. Theador Hawthorne   5.  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 Gala Romney, who she follows with for local/general GI care   6.  Lung mass, followed at [facility] - Right middle lobe consolidative mass noted to be enlarging on CT scan done on 08/24/2020 - PET CT scan showed minimal FDG avid RML consolidative opacity, progressively increased in size compared to  2011 - CT-guided biopsy on 11/09/2020 with pathology showing benign lung parenchyma, but without explanation of the opacity seen on imaging - Seen by interventional pulmonologist (Dr. Ronn Melena and Dr. Williemae Natter) at [facility] on 10/19/2022 for evaluation of her slowly enlarging pulmonary mass in the RML as well as rising AFP suspicious for [facility] (s/p radioembolization with IR).  She was recommended for biopsy via bronchoscopy with mediastinal staging, which patient has for the time being declined while she "gives it some more thought."   7.  Other history - PMH: Cirrhosis, chronic kidney disease, congestive heart failure, type 2 diabetes, hypertension - SOCIAL: Lifelong non-smoker.  No tobacco, alcohol, illicit drug use.  PLAN SUMMARY: >> Labs in 6 months = CBC/D, CMP, ferritin, iron/TIBC, B12, MMA >> PHONE visit 1 week after labs      REVIEW OF SYSTEMS:   Review of Systems  Constitutional:  Positive for fatigue. Negative for appetite change, chills, diaphoresis, fever and unexpected weight change.  HENT:   Negative for lump/mass and nosebleeds.   Eyes:  Negative for eye problems.  Respiratory:  Positive for shortness of breath (allergies). Negative for cough and hemoptysis.   Cardiovascular:  Negative for chest pain, leg swelling and palpitations.  Gastrointestinal:  Positive for vomiting. Negative for abdominal pain, blood in stool, constipation, diarrhea and nausea.  Genitourinary:  Negative for hematuria.   Skin: Negative.   Neurological:  Negative for dizziness, headaches and light-headedness.  Hematological:  Does not bruise/bleed easily.     PHYSICAL EXAM:  ECOG PERFORMANCE STATUS: 1 - Symptomatic but completely ambulatory  There were no vitals filed for this visit. There were no vitals filed for this visit. Physical Exam Constitutional:      Appearance: Normal appearance. She is obese.  Cardiovascular:     Heart sounds: Normal heart sounds.  Pulmonary:      Breath sounds: Normal breath sounds.  Neurological:     General: No focal deficit present.     Mental Status: Mental status is at baseline.  Psychiatric:        Behavior: Behavior normal. Behavior is cooperative.     PAST MEDICAL/SURGICAL HISTORY:  Past Medical History:  Diagnosis Date   B12 deficiency 11/03/2016   B12 deficiency 11/03/2016   Barrett's esophagus    Cirrhosis of liver without mention of alcohol    hep B surface antigen and HCV ab negative.pt has not had hepatitis A and B vaccines.U/S on 07/04/13 shows cirrhosis.   Diverticula of colon    pancolonic   Esophageal reflux    Esophageal varices (HCC)    Hiatal hernia    Iron deficiency anemia, unspecified    Other and unspecified hyperlipidemia    Portal hypertensive gastropathy (HCC)    Tubular adenoma    Type II or unspecified type diabetes mellitus without mention of complication, not stated as uncontrolled    Unspecified essential hypertension    Unspecified hemorrhoids without mention of complication    Past Surgical History:  Procedure Laterality Date   ABDOMINAL HYSTERECTOMY     BIOPSY  04/30/2016  Procedure: BIOPSY;  Surgeon: Daneil Dolin, MD;  [facility];  Service: Endoscopy;;  esophagus   BIOPSY  02/24/2019   Procedure: BIOPSY;  Surgeon: Daneil Dolin, MD;  [facility];  Service: Endoscopy;;   CESAREAN SECTION     x 2   COLONOSCOPY  03/2006   left sided diverticula, splenic flexure tublar adenoma   COLONOSCOPY  07/2002   villous tubular adenoma in rectum   COLONOSCOPY  09/23/09   external hemorrhoids/scattered pan colonic diverticula otherwise normal. cecal lipoma bx negative, normal TI. Next TCS 09/2014   COLONOSCOPY N/A 11/12/2014   Procedure: COLONOSCOPY;  Surgeon: Daneil Dolin, MD;  [facility];  Service: Endoscopy;  Laterality: N/A;  1215 - moved to 12:30 - Ginger to notify pt   COLONOSCOPY N/A 01/07/2017   Rourk: Grade 4 internal hemorrhoids, 5 mm ulcer in  the sigmoid colon (benign bx), 7 mm polyp removed from the cecum (tubular adenoma), diverticulosis.next tcs in five years   COLONOSCOPY WITH PROPOFOL N/A 04/01/2022   Procedure: COLONOSCOPY WITH PROPOFOL;  Surgeon: Daneil Dolin, MD;  [facility];  Service: Endoscopy;  Laterality: N/A;  8:00am   ESOPHAGEAL BANDING N/A 12/20/2016   Procedure: ESOPHAGEAL BANDING;  Surgeon: Danie Binder, MD;  [facility];  Service: Endoscopy;  Laterality: N/A;   ESOPHAGEAL BANDING N/A 03/18/2017   Procedure: ESOPHAGEAL BANDING;  Surgeon: Daneil Dolin, MD;  [facility];  Service: Endoscopy;  Laterality: N/A;   ESOPHAGEAL BANDING  12/17/2017   Procedure: ESOPHAGEAL BANDING;  Surgeon: Daneil Dolin, MD;  [facility];  Service: Endoscopy;;   ESOPHAGEAL BANDING N/A 07/03/2020   Procedure: ESOPHAGEAL BANDING;  Surgeon: Daneil Dolin, MD;  [facility];  Service: Endoscopy;  Laterality: N/A;   ESOPHAGEAL BANDING  04/01/2022   Procedure: ESOPHAGEAL BANDING;  Surgeon: Daneil Dolin, MD;  [facility];  Service: Endoscopy;;   ESOPHAGOGASTRODUODENOSCOPY  08/2006   barretts esophagus, no dyplasia   ESOPHAGOGASTRODUODENOSCOPY  03/2006   barretts esophagus,bx focal atypia c/w low grade dysplasia, SB bx negative for celiac   ESOPHAGOGASTRODUODENOSCOPY  09/23/09   3 columns of grade 2 esophageal varices/hiatal hernia/3-4 cm segment Barrett's esophagus without dysplasia. Next EGD 09/2012   ESOPHAGOGASTRODUODENOSCOPY N/A 11/11/2012   Dr. Gala Romney- Barrett;s esophagus, esophageal varices, portal gastopathy. hiatal hernia   ESOPHAGOGASTRODUODENOSCOPY N/A 04/30/2016   Procedure: ESOPHAGOGASTRODUODENOSCOPY (EGD);  Surgeon: Daneil Dolin, MD;  [facility];  Service: Endoscopy;  Laterality: N/A;  815   ESOPHAGOGASTRODUODENOSCOPY N/A 12/20/2016   Procedure: ESOPHAGOGASTRODUODENOSCOPY (EGD);  Surgeon: Danie Binder, MD;  [facility];  Service:  Endoscopy;  Laterality: N/A;   ESOPHAGOGASTRODUODENOSCOPY N/A 01/07/2017   Procedure: ESOPHAGOGASTRODUODENOSCOPY (EGD);  Surgeon: Daneil Dolin, MD;  [facility];  Service: Endoscopy;  Laterality: N/A;   ESOPHAGOGASTRODUODENOSCOPY N/A 03/18/2017   Procedure: ESOPHAGOGASTRODUODENOSCOPY (EGD);  Surgeon: Daneil Dolin, MD;  [facility];  Service: Endoscopy;  Laterality: N/A;  1030    ESOPHAGOGASTRODUODENOSCOPY N/A 12/17/2017   Procedure: ESOPHAGOGASTRODUODENOSCOPY (EGD);  Surgeon: Daneil Dolin, MD;  [facility];  Service: Endoscopy;  Laterality: N/A;  10:30am   ESOPHAGOGASTRODUODENOSCOPY N/A 02/24/2019   Rourk: No esophageal varices seen, scars noted from prior banding.  5 cm segment of salmon-colored epithelium coming up from the GE junction consistent with Barrett's (bx with no dysplasia).  Portal gastropathy.  Small hiatal hernia.  Recommended  repeat EGD in 18 months.   ESOPHAGOGASTRODUODENOSCOPY N/A 07/03/2020   Procedure: ESOPHAGOGASTRODUODENOSCOPY (EGD);  Surgeon: Daneil Dolin, MD;  [facility];  Service: Endoscopy;  Laterality: N/A;  10:15am   ESOPHAGOGASTRODUODENOSCOPY (EGD) WITH PROPOFOL N/A 04/01/2022   Procedure: ESOPHAGOGASTRODUODENOSCOPY (EGD) WITH PROPOFOL;  Surgeon: Daneil Dolin, MD;  [facility];  Service: Endoscopy;  Laterality: N/A;   [place]  2008   POLYPECTOMY  04/01/2022   Procedure: POLYPECTOMY;  Surgeon: Daneil Dolin, MD;  [facility];  Service: Endoscopy;;  ascending and descending   small bowel capsule endoscopy  10/2009   normal    SOCIAL HISTORY:  Social History   Socioeconomic History   Marital status: Married    Spouse name: Not on file   Number of children: Not on file   Years of education: Not on file   Highest education level: Not on file  Occupational History   Not on file  Tobacco Use   Smoking status: Never   Smokeless tobacco: Never  Vaping Use   Vaping Use:  Never used  Substance and Sexual Activity   Alcohol use: No   Drug use: No   Sexual activity: Yes  Other Topics Concern   Not on file  Social History Narrative   Not on file   Social Determinants of Health   Financial Resource Strain: Low Risk  (09/11/2022)   Overall Financial Resource Strain (CARDIA)    Difficulty of Paying Living Expenses: Not hard at all  Food Insecurity: No Food Insecurity (04/16/2022)   Hunger Vital Sign    Worried About Running Out of Food in the Last Year: Never true    Ran Out of Food in the Last Year: Never true  Transportation Needs: No Transportation Needs (09/11/2022)   PRAPARE - Hydrologist (Medical): No    Lack of Transportation (Non-Medical): No  Physical Activity: Not on file  Stress: Not on file  Social Connections: Not on file  Intimate Partner Violence: Not on file    FAMILY HISTORY:  Family History  Problem Relation Age of Onset   Alzheimer's disease Mother    CAD Father    Diabetes Mellitus II Father    Alzheimer's disease Father    Diabetes Mellitus II Sister    Colon cancer Sister 75       small, surgical excision; chemo/rad not needed   Cancer - Other Brother    Diabetes Mellitus II Brother    Hypertension Brother    Liver disease Neg Hx     CURRENT MEDICATIONS:  Outpatient Encounter Medications as of 11/24/2022  Medication Sig   Accu-Chek Softclix Lancets lancets USE TO CHECK BLOOD SUGAR FOUR TIMES DAILY. Dx: E11.65   Cholecalciferol (VITAMIN D) 2000 units tablet Take 2,000 Units by mouth daily.   Continuous Blood Gluc Receiver (FREESTYLE LIBRE 2 READER) DEVI As directed   Continuous Blood Gluc Sensor (FREESTYLE LIBRE 2 SENSOR) MISC 1 Piece by Does not apply route every 14 (fourteen) days.   insulin degludec (TRESIBA FLEXTOUCH) 200 UNIT/ML FlexTouch Pen Inject 20 Units into the skin at bedtime.   lisinopril (PRINIVIL,ZESTRIL) 10 MG tablet Take 10 mg by mouth daily.   NOVOFINE 32G X 6 MM MISC USE  ONE AS DIRECTED TWICE DAILY.   OZEMPIC, 2 MG/DOSE, 8 MG/3ML SOPN INJECT '2MG'$  UNDER THE SKIN EVERY WEEK   pantoprazole (PROTONIX) 40 MG tablet Take 1 tablet (40 mg total) by mouth 2 (  two) times daily before a meal.   propranolol (INDERAL) 10 MG tablet TAKE THREE (3) TABLETS BY MOUTH EVERY MORNING AND 3 TABLETS EVERY EVENING   No facility-administered encounter medications on file as of 11/24/2022.    ALLERGIES:  Allergies  Allergen Reactions   Codeine Nausea And Vomiting    LABORATORY DATA:  I have reviewed the labs as listed.  CBC    Component Value Date/Time   WBC 4.7 10/02/2022 1053   RBC 3.72 (L) 10/02/2022 1053   HGB 11.9 (L) 10/02/2022 1053   HCT 35.3 (L) 10/02/2022 1053   PLT 65 (L) 10/02/2022 1053   MCV 94.9 10/02/2022 1053   MCH 32.0 10/02/2022 1053   MCHC 33.7 10/02/2022 1053   RDW 14.7 10/02/2022 1053   LYMPHSABS 0.9 10/02/2022 1053   MONOABS 0.4 10/02/2022 1053   EOSABS 0.2 10/02/2022 1053   BASOSABS 0.0 10/02/2022 1053      Latest Ref Rng & Units 10/27/2022   11:52 AM 10/02/2022   10:53 AM 08/10/2022   11:51 AM  CMP  Glucose 70 - 99 mg/dL 97  80  160   BUN 8 - 23 mg/dL '14  17  19   '$ Creatinine 0.44 - 1.00 mg/dL 1.60  1.73  1.76   Sodium 135 - 145 mmol/L 138  137  136   Potassium 3.5 - 5.1 mmol/L 3.9  4.0  4.5   Chloride 98 - 111 mmol/L 108  104  111   CO2 22 - 32 mmol/L '23  27  21   '$ Calcium 8.9 - 10.3 mg/dL 8.7  8.8  8.8   Total Protein 6.5 - 8.1 g/dL 6.2  6.5    Total Bilirubin 0.3 - 1.2 mg/dL 3.1  3.1    Alkaline Phos 38 - 126 U/L 78  80    AST 15 - 41 U/L 35  36    ALT 0 - 44 U/L 16  15      DIAGNOSTIC IMAGING:  I have independently reviewed the relevant imaging and discussed with the patient.   WRAP UP:  All questions were answered. The patient knows to call the clinic with any problems, questions or concerns.  Medical decision making: Moderate  Time spent on visit: I spent 20 minutes counseling the patient face to face. The total time spent  in the appointment was 30 minutes and more than 50% was on counseling.  [name], PA-C  11/24/22 1:25 PM

## 2022-11-24 ENCOUNTER — Ambulatory Visit: Payer: Medicare PPO | Admitting: Physician Assistant

## 2022-11-24 ENCOUNTER — Inpatient Hospital Stay: Payer: Medicare PPO | Attending: Hematology | Admitting: Physician Assistant

## 2022-11-24 ENCOUNTER — Other Ambulatory Visit: Payer: Self-pay

## 2022-11-24 ENCOUNTER — Other Ambulatory Visit (HOSPITAL_COMMUNITY)
Admission: RE | Admit: 2022-11-24 | Discharge: 2022-11-24 | Disposition: A | Discharge status: Home / Self Care | Payer: Medicare PPO | Source: Ambulatory Visit | Attending: Nephrology | Admitting: Nephrology

## 2022-11-24 VITALS — BP 123/57 | HR 70 | Temp 97.6°F | Resp 18 | Ht 60.0 in | Wt 173.0 lb

## 2022-11-24 DIAGNOSIS — E538 Deficiency of other specified B group vitamins: Secondary | ICD-10-CM | POA: Insufficient documentation

## 2022-11-24 DIAGNOSIS — D500 Iron deficiency anemia secondary to blood loss (chronic): Secondary | ICD-10-CM | POA: Insufficient documentation

## 2022-11-24 DIAGNOSIS — I130 Hypertensive heart and chronic kidney disease with heart failure and stage 1 through stage 4 chronic kidney disease, or unspecified chronic kidney disease: Secondary | ICD-10-CM | POA: Insufficient documentation

## 2022-11-24 DIAGNOSIS — Z90710 Acquired absence of both cervix and uterus: Secondary | ICD-10-CM | POA: Insufficient documentation

## 2022-11-24 DIAGNOSIS — E1122 Type 2 diabetes mellitus with diabetic chronic kidney disease: Secondary | ICD-10-CM | POA: Insufficient documentation

## 2022-11-24 DIAGNOSIS — K7581 Nonalcoholic steatohepatitis (NASH): Secondary | ICD-10-CM | POA: Diagnosis not present

## 2022-11-24 DIAGNOSIS — I509 Heart failure, unspecified: Secondary | ICD-10-CM | POA: Insufficient documentation

## 2022-11-24 DIAGNOSIS — D696 Thrombocytopenia, unspecified: Secondary | ICD-10-CM | POA: Insufficient documentation

## 2022-11-24 DIAGNOSIS — N189 Chronic kidney disease, unspecified: Secondary | ICD-10-CM | POA: Insufficient documentation

## 2022-11-24 DIAGNOSIS — C220 Liver cell carcinoma: Secondary | ICD-10-CM | POA: Insufficient documentation

## 2022-11-24 DIAGNOSIS — D631 Anemia in chronic kidney disease: Secondary | ICD-10-CM | POA: Diagnosis not present

## 2022-11-24 DIAGNOSIS — Z800 Family history of malignant neoplasm of digestive organs: Secondary | ICD-10-CM | POA: Insufficient documentation

## 2022-11-24 DIAGNOSIS — I129 Hypertensive chronic kidney disease with stage 1 through stage 4 chronic kidney disease, or unspecified chronic kidney disease: Secondary | ICD-10-CM | POA: Insufficient documentation

## 2022-11-24 DIAGNOSIS — K7460 Unspecified cirrhosis of liver: Secondary | ICD-10-CM | POA: Insufficient documentation

## 2022-11-24 DIAGNOSIS — R918 Other nonspecific abnormal finding of lung field: Secondary | ICD-10-CM | POA: Diagnosis not present

## 2022-11-24 DIAGNOSIS — N1832 Chronic kidney disease, stage 3b: Secondary | ICD-10-CM | POA: Diagnosis not present

## 2022-11-24 DIAGNOSIS — R161 Splenomegaly, not elsewhere classified: Secondary | ICD-10-CM | POA: Insufficient documentation

## 2022-11-24 LAB — RENAL FUNCTION PANEL
Albumin: 3.1 g/dL — ABNORMAL LOW (ref 3.5–5.0)
Anion gap: 4 — ABNORMAL LOW (ref 5–15)
BUN: 13 mg/dL (ref 8–23)
CO2: 25 mmol/L (ref 22–32)
Calcium: 8.9 mg/dL (ref 8.9–10.3)
Chloride: 106 mmol/L (ref 98–111)
Creatinine, Ser: 1.44 mg/dL — ABNORMAL HIGH (ref 0.44–1.00)
GFR, Estimated: 39 mL/min — ABNORMAL LOW (ref >60)
Glucose, Bld: 135 mg/dL — ABNORMAL HIGH (ref 70–99)
Phosphorus: 3 mg/dL (ref 2.5–4.6)
Potassium: 4.1 mmol/L (ref 3.5–5.1)
Sodium: 135 mmol/L (ref 135–145)

## 2022-11-24 LAB — CBC
HCT: 33.4 % — ABNORMAL LOW (ref 36.0–46.0)
Hemoglobin: 11.5 g/dL — ABNORMAL LOW (ref 12.0–15.0)
MCH: 31.6 pg (ref 26.0–34.0)
MCHC: 34.4 g/dL (ref 30.0–36.0)
MCV: 91.8 fL (ref 80.0–100.0)
Platelets: 52 10*3/uL — ABNORMAL LOW (ref 150–400)
RBC: 3.64 MIL/uL — ABNORMAL LOW (ref 3.87–5.11)
RDW: 14.4 % (ref 11.5–15.5)
WBC: 4.1 10*3/uL (ref 4.0–10.5)
nRBC: 0 % (ref 0.0–0.2)

## 2022-11-24 LAB — PROTEIN / CREATININE RATIO, URINE
Creatinine, Urine: 337 mg/dL
Protein Creatinine Ratio: 0.02 mg/mg{Cre} (ref 0.00–0.15)
Total Protein, Urine: 6 mg/dL

## 2022-11-24 NOTE — Patient Instructions (Signed)
[facility] at [facility] Discharge Instructions  You were seen today by Torrence Hammack PA-C for your iron deficiency, B12, and low platelets.  Your levels are stable, and we will re-check in 6 months.    LABS: Return in 6 months for repeat labs   OTHER TESTS: None  MEDICATIONS: No changes.  FOLLOW-UP APPOINTMENT: Continue follow-up at [facility].  Return to [facility] in 6 months.   Thank you for choosing [facility] at [facility] to provide your oncology and hematology care.  To afford each patient quality time with our provider, please arrive at least 15 minutes before your scheduled appointment time.   If you have a lab appointment with the Cancer Center please come in thru the Main Entrance and check in at the main information desk.  You need to re-schedule your appointment should you arrive 10 or more minutes late.  We strive to give you quality time with our providers, and arriving late affects you and other patients whose appointments are after yours.  Also, if you no show three or more times for appointments you may be dismissed from the clinic at the providers discretion.     Again, thank you for choosing [facility].  Our hope is that these requests will decrease the amount of time that you wait before being seen by our physicians.       _____________________________________________________________  Should you have questions after your visit to [facility], please contact our office at [phone] and follow the prompts.  Our office hours are 8:00 a.m. and 4:30 p.m. Monday - Friday.  Please note that voicemails left after 4:00 p.m. may not be returned until the following business day.  We are closed weekends and major holidays.  You do have access to a nurse 24-7, just call the main number to the clinic [phone] and do not press any options, hold on the line and a nurse will answer the phone.    For  prescription refill requests, have your pharmacy contact our office and allow 72 hours.    Due to Covid, you will need to wear a mask upon entering the hospital. If you do not have a mask, a mask will be given to you at the Main Entrance upon arrival. For doctor visits, patients may have 1 support person age 18 or older with them. For treatment visits, patients can not have anyone with them due to social distancing guidelines and our immunocompromised population.     

## 2022-12-01 ENCOUNTER — Encounter: Payer: Self-pay | Admitting: *Deleted

## 2022-12-02 DIAGNOSIS — N189 Chronic kidney disease, unspecified: Secondary | ICD-10-CM | POA: Diagnosis not present

## 2022-12-02 DIAGNOSIS — C220 Liver cell carcinoma: Secondary | ICD-10-CM | POA: Diagnosis not present

## 2022-12-02 DIAGNOSIS — I129 Hypertensive chronic kidney disease with stage 1 through stage 4 chronic kidney disease, or unspecified chronic kidney disease: Secondary | ICD-10-CM | POA: Diagnosis not present

## 2022-12-02 DIAGNOSIS — R918 Other nonspecific abnormal finding of lung field: Secondary | ICD-10-CM | POA: Diagnosis not present

## 2022-12-02 DIAGNOSIS — D696 Thrombocytopenia, unspecified: Secondary | ICD-10-CM | POA: Diagnosis not present

## 2022-12-02 DIAGNOSIS — E1122 Type 2 diabetes mellitus with diabetic chronic kidney disease: Secondary | ICD-10-CM | POA: Diagnosis not present

## 2022-12-02 DIAGNOSIS — D631 Anemia in chronic kidney disease: Secondary | ICD-10-CM | POA: Diagnosis not present

## 2022-12-02 DIAGNOSIS — K7460 Unspecified cirrhosis of liver: Secondary | ICD-10-CM | POA: Diagnosis not present

## 2022-12-07 DIAGNOSIS — E1165 Type 2 diabetes mellitus with hyperglycemia: Secondary | ICD-10-CM | POA: Diagnosis not present

## 2022-12-08 ENCOUNTER — Other Ambulatory Visit: Payer: Self-pay | Admitting: Gastroenterology

## 2022-12-09 ENCOUNTER — Other Ambulatory Visit: Payer: Self-pay | Admitting: Internal Medicine

## 2022-12-09 DIAGNOSIS — Z1231 Encounter for screening mammogram for malignant neoplasm of breast: Secondary | ICD-10-CM

## 2022-12-22 ENCOUNTER — Ambulatory Visit
Admission: RE | Admit: 2022-12-22 | Discharge: 2022-12-22 | Disposition: A | Discharge status: Home / Self Care | Payer: Medicare PPO | Source: Ambulatory Visit | Attending: Internal Medicine | Admitting: Internal Medicine

## 2022-12-22 DIAGNOSIS — Z1231 Encounter for screening mammogram for malignant neoplasm of breast: Secondary | ICD-10-CM | POA: Diagnosis not present

## 2023-01-01 ENCOUNTER — Ambulatory Visit: Payer: Self-pay | Admitting: *Deleted

## 2023-01-01 NOTE — Patient Outreach (Signed)
  Care Coordination   01/01/2023 Name: Alisha [name] MRN: [id number] DOB: [birth year]   Care Coordination Outreach Attempts:  An unsuccessful telephone outreach was attempted for a scheduled appointment today. #2  Follow Up Plan:  Additional outreach attempts will be made to offer the patient care coordination information and services.   Encounter Outcome:  No Answer   Care Coordination Interventions:  No, not indicated    Demetrios Loll, BSN, RN-BC RN Care Coordinator [facility]  [facility] Direct Dial: [phone] Main #: [phone]

## 2023-01-04 ENCOUNTER — Telehealth: Payer: Self-pay | Admitting: *Deleted

## 2023-01-04 NOTE — Progress Notes (Signed)
  Care Coordination Note  01/04/2023 Name: KAIRI HARSHBARGER MRN: [id number] DOB: 1950/10/09  [name] is a 72 y.o. year old female who is a primary care patient of Doreen Beam B, MD and is actively engaged with the care management team. I reached out to [name] by phone today to assist with re-scheduling a follow up visit with the RN Case Manager  Follow up plan: Unsuccessful telephone outreach attempt made.   [facility]  Care Coordination Care Guide  Direct Dial: [phone]

## 2023-01-07 DIAGNOSIS — E1165 Type 2 diabetes mellitus with hyperglycemia: Secondary | ICD-10-CM | POA: Diagnosis not present

## 2023-01-11 NOTE — Progress Notes (Signed)
  Care Coordination Note  01/11/2023 Name: [name] MRN: [id number] DOB: 06/23/1951  Alisha [name] is a 72 y.o. year old female who is a primary care patient of Doreen Beam B, MD and is actively engaged with the care management team. I reached out to Alisha [name] by phone today to assist with re-scheduling a follow up visit with the RN Case Manager  Follow up plan: Unsuccessful telephone outreach attempt made. We have been unable to make contact with the patient for follow up.  [facility]  Care Coordination Care Guide  Direct Dial: [phone]

## 2023-01-25 ENCOUNTER — Telehealth: Payer: Self-pay | Admitting: *Deleted

## 2023-01-25 NOTE — Progress Notes (Signed)
  Care Coordination Note  01/25/2023 Name: [name] MRN: [id number] DOB: 05-16-1951  Alisha [name] is a 72 y.o. year old female who is a primary care patient of Doreen Beam B, MD and is actively engaged with the care management team. I reached out to Alisha [name] by phone today to assist with re-scheduling a follow up visit with the RN Case Manager  Follow up plan: Unsuccessful telephone outreach attempt made. A HIPAA compliant phone message was left for the patient providing contact information and requesting a return call.   [facility]  Care Coordination Care Guide  Direct Dial: [phone]

## 2023-01-27 DIAGNOSIS — K7581 Nonalcoholic steatohepatitis (NASH): Secondary | ICD-10-CM | POA: Diagnosis not present

## 2023-01-27 DIAGNOSIS — C220 Liver cell carcinoma: Secondary | ICD-10-CM | POA: Diagnosis not present

## 2023-01-27 DIAGNOSIS — K7469 Other cirrhosis of liver: Secondary | ICD-10-CM | POA: Diagnosis not present

## 2023-01-27 DIAGNOSIS — K766 Portal hypertension: Secondary | ICD-10-CM | POA: Diagnosis not present

## 2023-01-27 DIAGNOSIS — R161 Splenomegaly, not elsewhere classified: Secondary | ICD-10-CM | POA: Diagnosis not present

## 2023-01-27 DIAGNOSIS — K7460 Unspecified cirrhosis of liver: Secondary | ICD-10-CM | POA: Diagnosis not present

## 2023-02-01 NOTE — Progress Notes (Signed)
  Care Coordination Note  02/01/2023 Name: [name] MRN: [id number] DOB: 24-Jul-1951  Alisha [name] is a 73 y.o. year old female who is a primary care patient of Vyas, Dhruv B, MD and is actively engaged with the care management team. I reached out to Alisha [name] by phone today to assist with re-scheduling a follow up visit with the RN Case Manager  Follow up plan: Telephone appointment with care management team member scheduled for:02/11/23  [facility] Coordination Care Guide  Direct Dial: [phone]

## 2023-02-04 ENCOUNTER — Other Ambulatory Visit: Payer: Self-pay | Admitting: "Endocrinology

## 2023-02-05 ENCOUNTER — Telehealth: Payer: Self-pay | Admitting: "Endocrinology

## 2023-02-05 NOTE — Telephone Encounter (Signed)
Rx refill sent.

## 2023-02-05 NOTE — Telephone Encounter (Signed)
[facility] will be sending in a request for Tresiba for a refill for pt

## 2023-02-06 DIAGNOSIS — E1165 Type 2 diabetes mellitus with hyperglycemia: Secondary | ICD-10-CM | POA: Diagnosis not present

## 2023-02-11 ENCOUNTER — Ambulatory Visit: Payer: Self-pay | Admitting: *Deleted

## 2023-02-11 ENCOUNTER — Encounter: Payer: Self-pay | Admitting: *Deleted

## 2023-02-11 NOTE — Patient Outreach (Signed)
  Care Coordination   Follow Up Visit Note   02/11/2023 Name: LEXXI ZACHRY MRN: [id number] DOB: 22-Apr-1951  [name] is a 72 y.o. year old female who sees Vyas, Angelina Pih, MD for primary care. I spoke with  [name] by phone today.  What matters to the patients health and wellness today?  Manage blood sugar    Goals Addressed               This Visit's Progress     Other     Manage Blood Sugar        Care Coordination Goals: Patient will follow-up with PCP and/or endocrinologist every 3 months or as recommended Patient will take medication as prescribed and reach out to provider with any negative side effects Patient will continue to monitor and record blood sugar 4 times per day and as needed with glucometer, and will call endocrinologist with any readings outside of recommended range Patient will take blood sugar log and meter to provider visits for review Patient will follow a modified carbohydrate diet and decrease simple carbohydrates and sugars Patient will increase activity level as tolerated with an ultimate goal of at least 150 minutes of exercise per week Patient will check feet daily for sores, wounds, calluses, etc and will notify provider of any abnormal findings Patient will have yearly eye exams to check for or monitor diabetic retinopathy Patient will reach out to RN Care Coordinator [phone] with any care coordination or resource needs         SDOH assessments and interventions completed:  Yes  SDOH Interventions Today    Flowsheet Row Most Recent Value  SDOH Interventions   Transportation Interventions Intervention Not Indicated  Financial Strain Interventions Intervention Not Indicated        Care Coordination Interventions:  Yes, provided  Interventions Today    Flowsheet Row Most Recent Value  Chronic Disease   Chronic disease during today's visit Diabetes, Other  [Liver Disease]  General Interventions    General Interventions Discussed/Reviewed General Interventions Discussed, General Interventions Reviewed, Durable Medical Equipment (DME), Labs, Doctor Visits  Labs Hgb A1c every 3 months, Kidney Function  [LFTs]  Doctor Visits Discussed/Reviewed Doctor Visits Discussed, Specialist, Doctor Visits Reviewed, PCP  Durable Medical Equipment (DME) Glucomoter  PCP/Specialist Visits Compliance with follow-up visit  Exercise Interventions   Exercise Discussed/Reviewed Physical Activity  Physical Activity Discussed/Reviewed Physical Activity Discussed, Physical Activity Reviewed  Education Interventions   Education Provided Provided Education  Provided Verbal Education On Mental Health/Coping with Illness, When to see the doctor, Blood Sugar Monitoring, Labs, Medication  Labs Reviewed Hgb A1c  [LFTs]  Nutrition Interventions   Nutrition Discussed/Reviewed Nutrition Discussed, Nutrition Reviewed  Pharmacy Interventions   Pharmacy Dicussed/Reviewed Pharmacy Topics Discussed, Pharmacy Topics Reviewed, Medications and their functions  Safety Interventions   Safety Discussed/Reviewed Safety Discussed, Safety Reviewed       Follow up plan: Follow up call scheduled for 03/11/23    Encounter Outcome:  Pt. Visit Completed   Demetrios Loll, BSN, RN-BC RN Care Coordinator [facility]  [facility] Direct Dial: [phone] Main #: [phone]

## 2023-02-18 DIAGNOSIS — H3561 Retinal hemorrhage, right eye: Secondary | ICD-10-CM | POA: Diagnosis not present

## 2023-02-28 NOTE — Progress Notes (Deleted)
Referring Provider: Ignatius Specking, MD Primary Care Physician:  Ignatius Specking, MD Primary GI Physician: Dr. Jena Gauss  No chief complaint on file.   HPI:   Alisha [name] is a 72 y.o. 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 Patient has had local seed implants. MRI 01/27/23 with nonviable HCC, but AFP continues to rise with plans to repeat MRI in August 2024.Marland Kitchen History of upper GI bleed secondary esophageal varices banded in the past on a nonselective beta-blocker in the way of Inderal 30 mg twice daily at this time. ***.   Last seen in the office 12/17/2021. She was scheduled for surveillance EGD and colonoscopy.   Procedures completed 04/01/2022: EGD with 4 columns of grade 2-3 esophageal varices with bleeding stigmata s/p esophageal banding.  1, underlying tongue of Barrett's epithelium distally.  Portal hypertensive gastropathy.  Small hiatal hernia.  Recommended repeat EGD in 3 months.  Colonoscopy with perianal hemorrhoids, 2 4-8 mm polyps resected and retrieved with 1 hemostasis clip placed, sigmoid and descending colon diverticulosis.  Pathology with tubular adenomas.  Recommended 5-year surveillance.   Most recent labs 01/27/2023 at [facility]. aFP 794.5, LFTs within normal limits, bilirubin elevated at 3.8, albumin low at 2.7, creatinine elevated at 2.2, INR elevated at 1.6, hemoglobin low at 10.7, platelets low at 44.  MELD 3.0 is 28  No evidence of iron deficiency on labs in January 2024.  Today:             HCC surveillance: MRI 01/2023 with LR-TR nonviable. Repeat MRI 04/2023 Hepatitis A serology reactive Hepatitis B vaccinated    Past Medical History:  Diagnosis Date   B12 deficiency 11/03/2016   B12 deficiency 11/03/2016   Barrett's esophagus    Cirrhosis of liver without mention of alcohol    hep B surface antigen and HCV ab negative.pt has not had hepatitis A and B  vaccines.U/S on 07/04/13 shows cirrhosis.   Diverticula of colon    pancolonic   Esophageal reflux    Esophageal varices (HCC)    Hiatal hernia    Iron deficiency anemia, unspecified    Other and unspecified hyperlipidemia    Portal hypertensive gastropathy (HCC)    Tubular adenoma    Type II or unspecified type diabetes mellitus without mention of complication, not stated as uncontrolled    Unspecified essential hypertension    Unspecified hemorrhoids without mention of complication     Past Surgical History:  Procedure Laterality Date   ABDOMINAL HYSTERECTOMY     BIOPSY  04/30/2016   Procedure: BIOPSY;  Surgeon: Corbin Ade, MD;  [facility];  Service: Endoscopy;;  esophagus   BIOPSY  02/24/2019   Procedure: BIOPSY;  Surgeon: Corbin Ade, MD;  [facility];  Service: Endoscopy;;   CESAREAN SECTION     x 2   COLONOSCOPY  03/2006   left sided diverticula, splenic flexure tublar adenoma   COLONOSCOPY  07/2002   villous tubular adenoma in rectum   COLONOSCOPY  09/23/09   external hemorrhoids/scattered pan colonic diverticula otherwise normal. cecal lipoma bx negative, normal TI. Next TCS 09/2014   COLONOSCOPY N/A 11/12/2014   Procedure: COLONOSCOPY;  Surgeon: Corbin Ade, MD;  [facility];  Service: Endoscopy;  Laterality: N/A;  1215 - moved to 12:30 - Ginger to notify pt   COLONOSCOPY N/A 01/07/2017   Rourk: Grade 4 internal hemorrhoids, 5 mm ulcer  in the sigmoid colon (benign bx), 7 mm polyp removed from the cecum (tubular adenoma), diverticulosis.next tcs in five years   COLONOSCOPY WITH PROPOFOL N/A 04/01/2022   Procedure: COLONOSCOPY WITH PROPOFOL;  Surgeon: Corbin Ade, MD;  [facility];  Service: Endoscopy;  Laterality: N/A;  8:00am   ESOPHAGEAL BANDING N/A 12/20/2016   Procedure: ESOPHAGEAL BANDING;  Surgeon: West Bali, MD;  [facility];  Service: Endoscopy;  Laterality: N/A;   ESOPHAGEAL BANDING N/A  03/18/2017   Procedure: ESOPHAGEAL BANDING;  Surgeon: Corbin Ade, MD;  [facility];  Service: Endoscopy;  Laterality: N/A;   ESOPHAGEAL BANDING  12/17/2017   Procedure: ESOPHAGEAL BANDING;  Surgeon: Corbin Ade, MD;  [facility];  Service: Endoscopy;;   ESOPHAGEAL BANDING N/A 07/03/2020   Procedure: ESOPHAGEAL BANDING;  Surgeon: Corbin Ade, MD;  [facility];  Service: Endoscopy;  Laterality: N/A;   ESOPHAGEAL BANDING  04/01/2022   Procedure: ESOPHAGEAL BANDING;  Surgeon: Corbin Ade, MD;  [facility];  Service: Endoscopy;;   ESOPHAGOGASTRODUODENOSCOPY  08/2006   barretts esophagus, no dyplasia   ESOPHAGOGASTRODUODENOSCOPY  03/2006   barretts esophagus,bx focal atypia c/w low grade dysplasia, SB bx negative for celiac   ESOPHAGOGASTRODUODENOSCOPY  09/23/09   3 columns of grade 2 esophageal varices/hiatal hernia/3-4 cm segment Barrett's esophagus without dysplasia. Next EGD 09/2012   ESOPHAGOGASTRODUODENOSCOPY N/A 11/11/2012   Dr. Jena Gauss- Barrett;s esophagus, esophageal varices, portal gastopathy. hiatal hernia   ESOPHAGOGASTRODUODENOSCOPY N/A 04/30/2016   Procedure: ESOPHAGOGASTRODUODENOSCOPY (EGD);  Surgeon: Corbin Ade, MD;  [facility];  Service: Endoscopy;  Laterality: N/A;  815   ESOPHAGOGASTRODUODENOSCOPY N/A 12/20/2016   Procedure: ESOPHAGOGASTRODUODENOSCOPY (EGD);  Surgeon: West Bali, MD;  [facility];  Service: Endoscopy;  Laterality: N/A;   ESOPHAGOGASTRODUODENOSCOPY N/A 01/07/2017   Procedure: ESOPHAGOGASTRODUODENOSCOPY (EGD);  Surgeon: Corbin Ade, MD;  [facility];  Service: Endoscopy;  Laterality: N/A;   ESOPHAGOGASTRODUODENOSCOPY N/A 03/18/2017   Procedure: ESOPHAGOGASTRODUODENOSCOPY (EGD);  Surgeon: Corbin Ade, MD;  [facility];  Service: Endoscopy;  Laterality: N/A;  1030    ESOPHAGOGASTRODUODENOSCOPY N/A 12/17/2017   Procedure: ESOPHAGOGASTRODUODENOSCOPY (EGD);   Surgeon: Corbin Ade, MD;  [facility];  Service: Endoscopy;  Laterality: N/A;  10:30am   ESOPHAGOGASTRODUODENOSCOPY N/A 02/24/2019   Rourk: No esophageal varices seen, scars noted from prior banding.  5 cm segment of salmon-colored epithelium coming up from the GE junction consistent with Barrett's (bx with no dysplasia).  Portal gastropathy.  Small hiatal hernia.  Recommended repeat EGD in 18 months.   ESOPHAGOGASTRODUODENOSCOPY N/A 07/03/2020   Procedure: ESOPHAGOGASTRODUODENOSCOPY (EGD);  Surgeon: Corbin Ade, MD;  [facility];  Service: Endoscopy;  Laterality: N/A;  10:15am   ESOPHAGOGASTRODUODENOSCOPY (EGD) WITH PROPOFOL N/A 04/01/2022   Procedure: ESOPHAGOGASTRODUODENOSCOPY (EGD) WITH PROPOFOL;  Surgeon: Corbin Ade, MD;  [facility];  Service: Endoscopy;  Laterality: N/A;   HEMORRHOID SURGERY  2008   POLYPECTOMY  04/01/2022   Procedure: POLYPECTOMY;  Surgeon: Corbin Ade, MD;  [facility];  Service: Endoscopy;;  ascending and descending   small bowel capsule endoscopy  10/2009   normal    Current Outpatient Medications  Medication Sig Dispense Refill   Accu-Chek Softclix Lancets lancets USE TO CHECK BLOOD SUGAR FOUR TIMES DAILY. Dx: E11.65 200 each 2   Cholecalciferol (VITAMIN D) 2000 units tablet Take 2,000 Units by  mouth daily.     Continuous Blood Gluc Receiver (FREESTYLE LIBRE 2 READER) DEVI As directed 1 each 0   Continuous Blood Gluc Sensor (FREESTYLE LIBRE 2 SENSOR) MISC 1 Piece by Does not apply route every 14 (fourteen) days. 2 each 3   insulin degludec (TRESIBA FLEXTOUCH) 200 UNIT/ML FlexTouch Pen Inject 20 Units into the skin at bedtime. 9 mL 0   lisinopril (PRINIVIL,ZESTRIL) 10 MG tablet Take 10 mg by mouth daily.     NOVOFINE 32G X 6 MM MISC USE ONE AS DIRECTED TWICE DAILY.     OZEMPIC, 2 MG/DOSE, 8 MG/3ML SOPN INJECT 2MG  UNDER THE SKIN EVERY WEEK 3 mL 10   pantoprazole (PROTONIX) 40 MG tablet Take 1 tablet (40  mg total) by mouth 2 (two) times daily before a meal. 60 tablet 1   propranolol (INDERAL) 10 MG tablet TAKE THREE (3) TABLETS BY MOUTH EVERY MORNING AND 3 TABLETS EVERY EVENING 180 tablet 3   No current facility-administered medications for this visit.    Allergies as of 03/03/2023 - Review Complete 02/11/2023  Allergen Reaction Noted   Codeine Nausea And Vomiting 04/19/2012    Family History  Problem Relation Age of Onset   Alzheimer's disease Mother    CAD Father    Diabetes Mellitus II Father    Alzheimer's disease Father    Diabetes Mellitus II Sister    Colon cancer Sister 57       small, surgical excision; chemo/rad not needed   Cancer - Other Brother    Diabetes Mellitus II Brother    Hypertension Brother    Liver disease Neg Hx    Breast cancer Neg Hx     Social History   Socioeconomic History   Marital status: Married    Spouse name: Not on file   Number of children: Not on file   Years of education: Not on file   Highest education level: Not on file  Occupational History   Not on file  Tobacco Use   Smoking status: Never   Smokeless tobacco: Never  Vaping Use   Vaping Use: Never used  Substance and Sexual Activity   Alcohol use: No   Drug use: No   Sexual activity: Yes  Other Topics Concern   Not on file  Social History Narrative   Not on file   Social Determinants of Health   Financial Resource Strain: Low Risk  (02/11/2023)   Overall Financial Resource Strain (CARDIA)    Difficulty of Paying Living Expenses: Not hard at all  Food Insecurity: No Food Insecurity (04/16/2022)   Hunger Vital Sign    Worried About Running Out of Food in the Last Year: Never true    Ran Out of Food in the Last Year: Never true  Transportation Needs: No Transportation Needs (02/11/2023)   PRAPARE - Administrator, Civil Service (Medical): No    Lack of Transportation (Non-Medical): No  Physical Activity: Not on file  Stress: Not on file  Social  Connections: Not on file    Review of Systems: Gen: Denies fever, chills, cold or flu like symptoms, pre-syncope, or syncope,  CV: Denies chest pain, palpitation. Resp: Denies dyspnea at rest, cough. GI: See HPI Heme:See HPI  Physical Exam: There were no vitals taken for this visit. General:   Alert and oriented. No distress noted. Pleasant and cooperative.  Head:  Normocephalic and atraumatic. Eyes:  Conjuctiva clear without scleral icterus. Heart:  S1, S2 present without murmurs  appreciated. Lungs:  Clear to auscultation bilaterally. No wheezes, rales, or rhonchi. No distress.  Abdomen:  +BS, soft, non-tender and non-distended. No rebound or guarding. No HSM or masses noted. Msk:  Symmetrical without gross deformities. Normal posture. Extremities:  Without edema. Neurologic:  Alert and  oriented x4 Psych:  Normal mood and affect.    Assessment:     Plan:  ***   Ermalinda Memos, PA-C [facility] 03/03/2023

## 2023-03-01 DIAGNOSIS — I10 Essential (primary) hypertension: Secondary | ICD-10-CM | POA: Diagnosis not present

## 2023-03-01 DIAGNOSIS — R918 Other nonspecific abnormal finding of lung field: Secondary | ICD-10-CM | POA: Diagnosis not present

## 2023-03-01 DIAGNOSIS — K7460 Unspecified cirrhosis of liver: Secondary | ICD-10-CM | POA: Diagnosis not present

## 2023-03-01 DIAGNOSIS — K766 Portal hypertension: Secondary | ICD-10-CM | POA: Diagnosis not present

## 2023-03-01 DIAGNOSIS — I8500 Esophageal varices without bleeding: Secondary | ICD-10-CM | POA: Diagnosis not present

## 2023-03-03 ENCOUNTER — Ambulatory Visit: Payer: Medicare PPO | Admitting: Gastroenterology

## 2023-03-09 ENCOUNTER — Other Ambulatory Visit (HOSPITAL_COMMUNITY)
Admission: RE | Admit: 2023-03-09 | Discharge: 2023-03-09 | Disposition: A | Discharge status: Home / Self Care | Payer: Medicare PPO | Source: Ambulatory Visit | Attending: Nephrology | Admitting: Nephrology

## 2023-03-09 ENCOUNTER — Encounter: Payer: Self-pay | Admitting: Gastroenterology

## 2023-03-09 ENCOUNTER — Ambulatory Visit (INDEPENDENT_AMBULATORY_CARE_PROVIDER_SITE_OTHER): Payer: Medicare PPO | Admitting: Gastroenterology

## 2023-03-09 VITALS — BP 104/48 | HR 73 | Temp 98.3°F | Ht 60.0 in | Wt 165.3 lb

## 2023-03-09 DIAGNOSIS — R918 Other nonspecific abnormal finding of lung field: Secondary | ICD-10-CM | POA: Insufficient documentation

## 2023-03-09 DIAGNOSIS — I129 Hypertensive chronic kidney disease with stage 1 through stage 4 chronic kidney disease, or unspecified chronic kidney disease: Secondary | ICD-10-CM | POA: Insufficient documentation

## 2023-03-09 DIAGNOSIS — E1122 Type 2 diabetes mellitus with diabetic chronic kidney disease: Secondary | ICD-10-CM | POA: Diagnosis not present

## 2023-03-09 DIAGNOSIS — I8510 Secondary esophageal varices without bleeding: Secondary | ICD-10-CM | POA: Diagnosis not present

## 2023-03-09 DIAGNOSIS — K219 Gastro-esophageal reflux disease without esophagitis: Secondary | ICD-10-CM | POA: Diagnosis not present

## 2023-03-09 DIAGNOSIS — N189 Chronic kidney disease, unspecified: Secondary | ICD-10-CM | POA: Insufficient documentation

## 2023-03-09 DIAGNOSIS — C220 Liver cell carcinoma: Secondary | ICD-10-CM

## 2023-03-09 DIAGNOSIS — D696 Thrombocytopenia, unspecified: Secondary | ICD-10-CM | POA: Insufficient documentation

## 2023-03-09 DIAGNOSIS — D631 Anemia in chronic kidney disease: Secondary | ICD-10-CM | POA: Insufficient documentation

## 2023-03-09 DIAGNOSIS — K7460 Unspecified cirrhosis of liver: Secondary | ICD-10-CM | POA: Insufficient documentation

## 2023-03-09 DIAGNOSIS — K2270 Barrett's esophagus without dysplasia: Secondary | ICD-10-CM

## 2023-03-09 DIAGNOSIS — K7581 Nonalcoholic steatohepatitis (NASH): Secondary | ICD-10-CM | POA: Diagnosis not present

## 2023-03-09 DIAGNOSIS — E1165 Type 2 diabetes mellitus with hyperglycemia: Secondary | ICD-10-CM | POA: Diagnosis not present

## 2023-03-09 LAB — RENAL FUNCTION PANEL
Albumin: 3.3 g/dL — ABNORMAL LOW (ref 3.5–5.0)
Anion gap: 7 (ref 5–15)
BUN: 15 mg/dL (ref 8–23)
CO2: 23 mmol/L (ref 22–32)
Calcium: 9.2 mg/dL (ref 8.9–10.3)
Chloride: 106 mmol/L (ref 98–111)
Creatinine, Ser: 1.68 mg/dL — ABNORMAL HIGH (ref 0.44–1.00)
GFR, Estimated: 32 mL/min — ABNORMAL LOW (ref >60)
Glucose, Bld: 134 mg/dL — ABNORMAL HIGH (ref 70–99)
Phosphorus: 3.4 mg/dL (ref 2.5–4.6)
Potassium: 3.9 mmol/L (ref 3.5–5.1)
Sodium: 136 mmol/L (ref 135–145)

## 2023-03-09 LAB — CBC
HCT: 34.3 % — ABNORMAL LOW (ref 36.0–46.0)
Hemoglobin: 11.6 g/dL — ABNORMAL LOW (ref 12.0–15.0)
MCH: 31.7 pg (ref 26.0–34.0)
MCHC: 33.8 g/dL (ref 30.0–36.0)
MCV: 93.7 fL (ref 80.0–100.0)
Platelets: 64 10*3/uL — ABNORMAL LOW (ref 150–400)
RBC: 3.66 MIL/uL — ABNORMAL LOW (ref 3.87–5.11)
RDW: 14.8 % (ref 11.5–15.5)
WBC: 5.9 10*3/uL (ref 4.0–10.5)
nRBC: 0 % (ref 0.0–0.2)

## 2023-03-09 LAB — PROTEIN / CREATININE RATIO, URINE
Creatinine, Urine: 226 mg/dL
Total Protein, Urine: <6 mg/dL

## 2023-03-09 NOTE — Patient Instructions (Addendum)
Please keep your follow-up with [facility] liver/transplant and the plans for MRI in August.  Continue propranolol 30 mg twice daily and your pantoprazole 40 mg twice daily.  Cirrhosis Lifestyle Recommendations:  High-protein diet from a primarily plant-based diet. Avoid red meat.  No raw or undercooked meat, seafood, or shellfish. Low-fat/cholesterol/carbohydrate diet. Limit sodium to no more than 2000 mg/day including everything that you eat and drink. Recommend at least 30 minutes of aerobic and resistance exercise 3 days/week. Limit Tylenol to 2000 mg daily.   In order to make sure you are getting adequate amount of protein I would recommend a daily protein supplement/shake nightly before bed.  We will plan to see you in the office in 6 months, sooner if needed.  Please do not hesitate to reach out with any questions or concerns.  It was a pleasure to see you today. I want to create trusting relationships with patients. If you receive a survey regarding your visit,  I greatly appreciate you taking time to fill this out on paper or through your MyChart. I value your feedback.  Brooke Bonito, MSN, FNP-BC, AGACNP-BC [facility]

## 2023-03-09 NOTE — Progress Notes (Signed)
GI Office Note    Referring Provider: Ignatius Specking, MD Primary Care Physician:  Ignatius Specking, MD Primary Gastroenterologist: Gerrit Friends.Rourk, MD  Date:  03/09/2023  ID:  Alisha [name], DOB 1951/08/18, MRN [id number]   Chief Complaint   Chief Complaint  Patient presents with   Follow-up    Patient here today for a follow up on [place]. Patient denies any current gi issues. Pantoprazole 40 mg bid controls her GERD symptoms.   History of Present Illness  Alisha [name] is a 72 y.o. 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  EGD June 2020: -No active esophageal varices, scarring present -Barrett's esophagus s/p biopsy -Portal hypertensive gastropathy -Repeat EGD in 1.5 years  EGD October 2021: -No esophageal varices, scarring from prior banding -5 cm irregular tongues of Barrett's epithelium -Portal hypertensive gastropathy -Normal duodenum -Repeat EGD in 1.5 years for surveillance  Last office visit 12/17/2021.  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  Reflux well-controlled on pantoprazole 40 mg daily and occasionally occasionally needing this for breakthrough.  Denied any dysphagia.  Noted to not be a liver transplant candidate.  Liver disease well compensated.  Maintain on NSBB.  Advise repeat EGD for surveillance of varices.  Did not recommend any further upper GI biopsies given underlying portal hypertension.  No change in medications.  Will schedule for EGD.  EGD July 2023: -4 columns of grade 2 gastric varices s/p banding -1 column of underlying tongues of Barrett's esophagus recently -Portal hypertensive gastropathy -Small hiatal hernia  Colonoscopy July 2023: -External hemorrhoids -2 polyps in the descending colon s/p hemostatic clip x 1 -Left-sided diverticulosis -Pathology revealed tubular  adenomas -Repeat colonoscopy in 5 years  Has been following with [facility] with last office visit 01/27/23. Was previously evaluated in 2008 and not placed on the wait list as she was doing too well for transplant.  She has been following closely with transplant regarding management of cirrhosis and HCC in the setting of elevated AFP.  At the time of her office visit she was feeling well and have been working closely with her endocrinologist for controlling her blood sugar and she had lost 8 pounds since January 2024 on Ozempic 2 mg weekly.  She denied any icterus, jaundice, confusion, abdominal swelling, peripheral edema, or tremors.  She has a history of lung nodule and she opted for 1 year surveillance over bronchoscopy and biopsy and is being followed with pulmonology/oncology.  Her case was discussed with Dr. Guido Sander and she was advised to have an MRI of her abdomen 01/27/2023 with LR-TR nonviable and repeat MRI in August 2024 with CT chest at the same time.  Noted to be on nonselective beta-blocker.  Recent EGD in July 2023.  Noted to be vaccinated for hepatitis B and history of immunity to hepatitis A.  Plan to follow-up with them in 3 months.  MRI abdomen 01/27/23: Impression:  Hepatic findings:  1.  Observation 1: Stable posttreatment changes within segment 7 without  suspicious arterial hyperenhancement washout. LR-TR, nonviable.  2.  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 No new suspicious lesion.   Reviewed from last note with [facility]: Underwent a IVC guided biopsy on June 06, 2020 was negative for malignancy, but did not demonstrate findings consistent with the segment 7 lesion, raising concern that mass was not sampled.  RML consolidative mass noted to be enlarging on CT scan done 08/24/2020. PET CT scan showed minimal FDG avid RML consolidative opacity which is progressively increased in size compared to 2011 remains worrisome  for malignancy. She underwent a CT-guided biopsy 11/09/2020. Pathology showed benign lung parenchyma but did not explain the opacity seen on imaging. Of the options reviewed with her, she has opted for short interval surveillance with imaging.  July 2022 she was found to have an L4-5 mass on MRI with rising AFP.  Marginal transplant candidate due to comorbidities.   Today: Cirrhosis history Hematemesis/coffee ground emesis: no History of variceal bleeding: none Abdominal pain: stomach ache from time to time but nothing constant.  Abdominal distention/worsening ascites: none Fever/chills: none Episodes of confusion/disorientation: none Number of daily bowel movements: Has a little trouble with constipation but usually has 1 BM daily.  Taking diuretics?: none Date of last EGD: July 2023 Prior history of banding?: no Prior episodes of SBP: no Last time liver imaging was performed:May   Hepatitis A and B vaccination status: Hep B vaccinated. Hep A Ab +  MELD 3.0 score: 28 (as of 01/27/23 labs)  GERD controlled with PPI BID.   Does not have much appetite. Has been on the ozempic for a few months. She states  it has been like that for that last several years. Reports about a 30lb weight loss over the last several months since being on Ozempic. Has at least 1 meal per day (combination of breakfast/lunch) handful of carrots or fruit. Sometimes has meats. Does not use protein shakes.   Denis melena or brbpr. Occasionally she has some random vomiting or nausea. Family denies any jaundice. No pruritus. States she had seen implantation on a spot on her liver years ago.   Wt Readings from Last 3 Encounters:  03/09/23 165 lb 4.8 oz (75 kg)  11/24/22 173 lb (78.5 kg)  11/03/22 172 lb 12.8 oz (78.4 kg)    Current Outpatient Medications  Medication Sig Dispense Refill   Accu-Chek Softclix Lancets lancets USE TO CHECK BLOOD SUGAR FOUR TIMES DAILY. Dx: E11.65 200 each 2   Cholecalciferol (VITAMIN D)  2000 units tablet Take 2,000 Units by mouth daily.     Continuous Blood Gluc Receiver (FREESTYLE LIBRE 2 READER) DEVI As directed 1 each 0   Continuous Blood Gluc Sensor (FREESTYLE LIBRE 2 SENSOR) MISC 1 Piece by Does not apply route every 14 (fourteen) days. 2 each 3   insulin degludec (TRESIBA FLEXTOUCH) 200 UNIT/ML FlexTouch Pen Inject 20 Units into the skin at bedtime. 9 mL 0   lisinopril (PRINIVIL,ZESTRIL) 10 MG tablet Take 10 mg by mouth daily.     NOVOFINE 32G X 6 MM MISC USE ONE AS DIRECTED TWICE DAILY.     OZEMPIC, 2 MG/DOSE, 8 MG/3ML SOPN INJECT 2MG  UNDER THE SKIN EVERY WEEK 3 mL 10   pantoprazole (PROTONIX) 40 MG tablet Take 1 tablet (40 mg total) by mouth 2 (two) times daily before a meal. 60 tablet 1   propranolol (INDERAL) 10 MG tablet TAKE THREE (3) TABLETS BY MOUTH EVERY MORNING AND 3 TABLETS EVERY EVENING 180 tablet 3   No current facility-administered medications for this visit.    Past Medical History:  Diagnosis Date   B12 deficiency 11/03/2016   B12 deficiency 11/03/2016   Barrett's esophagus    Cirrhosis of liver without mention of alcohol    hep B surface antigen and HCV ab negative.pt has not had hepatitis A and B vaccines.U/S  on 07/04/13 shows cirrhosis.   Diverticula of colon    pancolonic   Esophageal reflux    Esophageal varices (HCC)    Hiatal hernia    Iron deficiency anemia, unspecified    Other and unspecified hyperlipidemia    Portal hypertensive gastropathy (HCC)    Tubular adenoma    Type II or unspecified type diabetes mellitus without mention of complication, not stated as uncontrolled    Unspecified essential hypertension    Unspecified hemorrhoids without mention of complication     Past Surgical History:  Procedure Laterality Date   ABDOMINAL HYSTERECTOMY     BIOPSY  04/30/2016   Procedure: BIOPSY;  Surgeon: Corbin Ade, MD;  [facility];  Service: Endoscopy;;  esophagus   BIOPSY  02/24/2019   Procedure: BIOPSY;  Surgeon:  Corbin Ade, MD;  [facility];  Service: Endoscopy;;   CESAREAN SECTION     x 2   COLONOSCOPY  03/2006   left sided diverticula, splenic flexure tublar adenoma   COLONOSCOPY  07/2002   villous tubular adenoma in rectum   COLONOSCOPY  09/23/09   external hemorrhoids/scattered pan colonic diverticula otherwise normal. cecal lipoma bx negative, normal TI. Next TCS 09/2014   COLONOSCOPY N/A 11/12/2014   Procedure: COLONOSCOPY;  Surgeon: Corbin Ade, MD;  [facility];  Service: Endoscopy;  Laterality: N/A;  1215 - moved to 12:30 - Ginger to notify pt   COLONOSCOPY N/A 01/07/2017   Rourk: Grade 4 internal hemorrhoids, 5 mm ulcer in the sigmoid colon (benign bx), 7 mm polyp removed from the cecum (tubular adenoma), diverticulosis.next tcs in five years   COLONOSCOPY WITH PROPOFOL N/A 04/01/2022   Procedure: COLONOSCOPY WITH PROPOFOL;  Surgeon: Corbin Ade, MD;  [facility];  Service: Endoscopy;  Laterality: N/A;  8:00am   ESOPHAGEAL BANDING N/A 12/20/2016   Procedure: ESOPHAGEAL BANDING;  Surgeon: West Bali, MD;  [facility];  Service: Endoscopy;  Laterality: N/A;   ESOPHAGEAL BANDING N/A 03/18/2017   Procedure: ESOPHAGEAL BANDING;  Surgeon: Corbin Ade, MD;  [facility];  Service: Endoscopy;  Laterality: N/A;   ESOPHAGEAL BANDING  12/17/2017   Procedure: ESOPHAGEAL BANDING;  Surgeon: Corbin Ade, MD;  [facility];  Service: Endoscopy;;   ESOPHAGEAL BANDING N/A 07/03/2020   Procedure: ESOPHAGEAL BANDING;  Surgeon: Corbin Ade, MD;  [facility];  Service: Endoscopy;  Laterality: N/A;   ESOPHAGEAL BANDING  04/01/2022   Procedure: ESOPHAGEAL BANDING;  Surgeon: Corbin Ade, MD;  [facility];  Service: Endoscopy;;   ESOPHAGOGASTRODUODENOSCOPY  08/2006   barretts esophagus, no dyplasia   ESOPHAGOGASTRODUODENOSCOPY  03/2006   barretts esophagus,bx focal atypia c/w low grade dysplasia, SB bx  negative for celiac   ESOPHAGOGASTRODUODENOSCOPY  09/23/09   3 columns of grade 2 esophageal varices/hiatal hernia/3-4 cm segment Barrett's esophagus without dysplasia. Next EGD 09/2012   ESOPHAGOGASTRODUODENOSCOPY N/A 11/11/2012   Dr. Jena Gauss- Barrett;s esophagus, esophageal varices, portal gastopathy. hiatal hernia   ESOPHAGOGASTRODUODENOSCOPY N/A 04/30/2016   Procedure: ESOPHAGOGASTRODUODENOSCOPY (EGD);  Surgeon: Corbin Ade, MD;  [facility];  Service: Endoscopy;  Laterality: N/A;  815   ESOPHAGOGASTRODUODENOSCOPY N/A 12/20/2016   Procedure: ESOPHAGOGASTRODUODENOSCOPY (EGD);  Surgeon: West Bali, MD;  [facility];  Service: Endoscopy;  Laterality: N/A;   ESOPHAGOGASTRODUODENOSCOPY N/A 01/07/2017   Procedure: ESOPHAGOGASTRODUODENOSCOPY (EGD);  Surgeon: Corbin Ade, MD;  [facility];  Service: Endoscopy;  Laterality: N/A;   ESOPHAGOGASTRODUODENOSCOPY N/A 03/18/2017   Procedure: ESOPHAGOGASTRODUODENOSCOPY (EGD);  Surgeon: Corbin Ade, MD;  [facility];  Service: Endoscopy;  Laterality: N/A;  1030    ESOPHAGOGASTRODUODENOSCOPY N/A 12/17/2017   Procedure: ESOPHAGOGASTRODUODENOSCOPY (EGD);  Surgeon: Corbin Ade, MD;  [facility];  Service: Endoscopy;  Laterality: N/A;  10:30am   ESOPHAGOGASTRODUODENOSCOPY N/A 02/24/2019   Rourk: No esophageal varices seen, scars noted from prior banding.  5 cm segment of salmon-colored epithelium coming up from the GE junction consistent with Barrett's (bx with no dysplasia).  Portal gastropathy.  Small hiatal hernia.  Recommended repeat EGD in 18 months.   ESOPHAGOGASTRODUODENOSCOPY N/A 07/03/2020   Procedure: ESOPHAGOGASTRODUODENOSCOPY (EGD);  Surgeon: Corbin Ade, MD;  [facility];  Service: Endoscopy;  Laterality: N/A;  10:15am   ESOPHAGOGASTRODUODENOSCOPY (EGD) WITH PROPOFOL N/A 04/01/2022   Procedure: ESOPHAGOGASTRODUODENOSCOPY (EGD) WITH PROPOFOL;  Surgeon: Corbin Ade, MD;   [facility];  Service: Endoscopy;  Laterality: N/A;   HEMORRHOID SURGERY  2008   POLYPECTOMY  04/01/2022   Procedure: POLYPECTOMY;  Surgeon: Corbin Ade, MD;  [facility];  Service: Endoscopy;;  ascending and descending   small bowel capsule endoscopy  10/2009   normal    Family History  Problem Relation Age of Onset   Alzheimer's disease Mother    CAD Father    Diabetes Mellitus II Father    Alzheimer's disease Father    Diabetes Mellitus II Sister    Colon cancer Sister 39       small, surgical excision; chemo/rad not needed   Cancer - Other Brother    Diabetes Mellitus II Brother    Hypertension Brother    Liver disease Neg Hx    Breast cancer Neg Hx     Allergies as of 03/09/2023 - Review Complete 03/09/2023  Allergen Reaction Noted   Codeine Nausea And Vomiting 04/19/2012    Social History   Socioeconomic History   Marital status: Married    Spouse name: Not on file   Number of children: Not on file   Years of education: Not on file   Highest education level: Not on file  Occupational History   Not on file  Tobacco Use   Smoking status: Never   Smokeless tobacco: Never  Vaping Use   Vaping Use: Never used  Substance and Sexual Activity   Alcohol use: No   Drug use: No   Sexual activity: Yes  Other Topics Concern   Not on file  Social History Narrative   Not on file   Social Determinants of Health   Financial Resource Strain: Low Risk  (02/11/2023)   Overall Financial Resource Strain (CARDIA)    Difficulty of Paying Living Expenses: Not hard at all  Food Insecurity: No Food Insecurity (04/16/2022)   Hunger Vital Sign    Worried About Running Out of Food in the Last Year: Never true    Ran Out of Food in the Last Year: Never true  Transportation Needs: No Transportation Needs (02/11/2023)   PRAPARE - Administrator, Civil Service (Medical): No    Lack of Transportation (Non-Medical): No  Physical Activity: Not on  file  Stress: Not on file  Social Connections: Not on file     Review of Systems   Gen: Denies fever, chills, anorexia. Denies fatigue, weakness, weight loss.  CV: Denies chest pain, palpitations, syncope, peripheral edema, and claudication.  Resp: Denies dyspnea at rest, cough, wheezing, coughing up blood, and pleurisy. GI: See HPI Derm: Denies rash, itching, dry skin Psych: Denies depression, anxiety, memory loss, confusion. No homicidal or suicidal ideation.  Heme: Denies bruising, bleeding, and enlarged lymph nodes.   Physical Exam   BP (!) 104/48 (BP Location: Left Arm, Patient Position: Sitting, Cuff Size: Large)   Pulse 73   Temp 98.3 F (36.8 C) (Temporal)   Ht 5' (1.524 m)   Wt 165 lb 4.8 oz (75 kg)   BMI 32.28 kg/m   General:   Alert and oriented. No distress noted. Pleasant and cooperative.  Head:  Normocephalic and atraumatic. Eyes:  Conjuctiva clear without scleral icterus. Lungs:  Clear to auscultation bilaterally. No wheezes, rales, or rhonchi. No distress.  Heart:  S1, S2 present without murmurs appreciated.  Abdomen:  +BS, soft, non-tender and non-distended. No rebound or guarding. No HSM or masses noted. Rectal: Deferred Msk:  Symmetrical without gross deformities. Normal posture. Extremities:  Without edema. Neurologic:  Alert and  oriented x4 Psych:  Alert and cooperative. Normal mood and affect.   Assessment  Alisha [name] is a 72 y.o. 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  NASH with HCC: Has been followed with [facility] and was not placed on the wait list in 2018 due to low MELD score and poor control diabetes.  She currently has had good improvement in her blood sugars however she has had a lung nodule concerning for malignancy therefore she has been deemed to not be a candidate for  liver transplantation.  Her case has been discussed with interventional radiology as well as transplant surgery given concern for [facility] as she has had an LR for mass in the past treated with Y90 radioembolization December 2022 and has regular MRIs performed.  Her AFP has continued to rise and have been concerning for advancing [facility] therefore they have plans to repeat her MRI along with a CT of her chest in 3 months to follow-up on lung nodule.  She has reported multiple biopsies in the past in regards to lung nodule that were negative but she continues to follow with Dr. Glenna Durand.  EGD up-to-date from July 2023 s/p banding of varices and has been maintained on propranolol twice daily.  She is achieving better control of her blood sugars as of recently.  Overall she currently appears to be well compensated despite elevated MELD score although this is likely in the setting of HCC.  Agree with continuing to follow with [facility] transplant.  We did discuss the importance of protein intake in diet today and given her chronic low appetite I advised for her to have at least daily/nightly protein shakes. (MELD 3.0: 28)   GERD, Barrett's esophagus: Well-controlled with pantoprazole 40 mg twice daily.  PLAN   Continue propranalol 30 mg BID Continue PPI BID Cirrhosis diet lifestyle recommendations reinforced (2 g sodium diet) Encouraged at least 1 protein shake daily Plans for MRI of the abdomen and labs with [facility] transplant in August. Follow up in 6 months     Brooke Bonito, MSN, FNP-BC, AGACNP-BC [facility]

## 2023-03-11 ENCOUNTER — Ambulatory Visit: Payer: Self-pay | Admitting: *Deleted

## 2023-03-11 DIAGNOSIS — K7460 Unspecified cirrhosis of liver: Secondary | ICD-10-CM | POA: Diagnosis not present

## 2023-03-11 DIAGNOSIS — N1832 Chronic kidney disease, stage 3b: Secondary | ICD-10-CM | POA: Diagnosis not present

## 2023-03-11 DIAGNOSIS — R918 Other nonspecific abnormal finding of lung field: Secondary | ICD-10-CM | POA: Diagnosis not present

## 2023-03-11 DIAGNOSIS — D638 Anemia in other chronic diseases classified elsewhere: Secondary | ICD-10-CM | POA: Diagnosis not present

## 2023-03-11 NOTE — Patient Outreach (Signed)
  Care Coordination   03/11/2023 Name: Alisha [name] MRN: [id number] DOB: Jul 01, 1951   Care Coordination Outreach Attempts:  An unsuccessful telephone outreach was attempted for a scheduled appointment today.  Follow Up Plan:  Additional outreach attempts will be made to offer the patient care coordination information and services.   Encounter Outcome:  No Answer. Left HIPAA compliant VM   Care Coordination Interventions:  No, not indicated    Demetrios Loll, BSN, RN-BC RN Care Coordinator [facility]  [facility] Direct Dial: [phone] Main #: [phone]

## 2023-03-31 ENCOUNTER — Encounter: Payer: Self-pay | Admitting: *Deleted

## 2023-04-02 ENCOUNTER — Encounter: Payer: Self-pay | Admitting: *Deleted

## 2023-04-08 DIAGNOSIS — E1165 Type 2 diabetes mellitus with hyperglycemia: Secondary | ICD-10-CM | POA: Diagnosis not present

## 2023-04-09 ENCOUNTER — Ambulatory Visit: Payer: Self-pay | Admitting: *Deleted

## 2023-04-09 ENCOUNTER — Encounter: Payer: Self-pay | Admitting: *Deleted

## 2023-04-09 NOTE — Patient Outreach (Signed)
Care Coordination   Follow Up Visit Note   04/09/2023 Name: Alisha [name] MRN: [id number] DOB: 25-Sep-1950  Alisha [name] is a 72 y.o. year old female who sees Vyas, Alisha Pih, MD for primary care. I spoke with  Alisha [name] by phone today.  What matters to the patients health and wellness today?  Managing blood sugar    Goals Addressed             This Visit's Progress    Manage Blood Sugar   Not on track    Care Coordination Goals: Patient will follow-up with PCP and/or endocrinologist every 3 months or as recommended Dr Fransico Him 04/2023 Patient will take medication as prescribed and reach out to provider with any negative side effects Patient will continue to monitor and record blood sugar 4 times per day and as needed with glucometer, and will call endocrinologist with any readings outside of recommended range Patient will take blood sugar log and meter to provider visits for review Patient will feat 3 meals a day at regular intervals with about the same amount of carbohydrates.  30 grams of carbs per meal and <15 grams of carbs in 1 or 2 snacks a day if needed Patient will verbalize understanding of importance to eat meals at regular intervals while taking a long acting insulin to avoid hypoglycemia Patient will reach out to RN Care Coordinator [phone] with any care coordination or resource needs         SDOH assessments and interventions completed:  Yes  SDOH Interventions Today    Flowsheet Row Most Recent Value  SDOH Interventions   Transportation Interventions Intervention Not Indicated  Financial Strain Interventions Intervention Not Indicated  Physical Activity Interventions Patient Declined        Care Coordination Interventions:  Yes, provided  Interventions Today    Flowsheet Row Most Recent Value  Chronic Disease   Chronic disease during today's visit Diabetes  General Interventions   General Interventions Discussed/Reviewed  General Interventions Discussed, General Interventions Reviewed, Labs, Durable Medical Equipment (DME), Doctor Visits  [Home Readings Blood Sugar this morning 117. No readings >200 and < 70 a few times over the past 2 week. Lowest this week was 78.]  Labs Hgb A1c every 3 months  Doctor Visits Discussed/Reviewed Doctor Visits Discussed, Doctor Visits Reviewed, PCP, Specialist  PCP/Specialist Visits Compliance with follow-up visit  [Dr Nida on 05/04/23. Follow-up with PCP as recommended]  Exercise Interventions   Exercise Discussed/Reviewed Physical Activity  Physical Activity Discussed/Reviewed Physical Activity Discussed, Physical Activity Reviewed  Education Interventions   Education Provided Provided Education  Provided Verbal Education On Nutrition, Blood Sugar Monitoring, Labs, When to see the doctor, Medication  Labs Reviewed Hgb A1c  [facility] 11/03/22 5%]  Nutrition Interventions   Nutrition Discussed/Reviewed Nutrition Discussed, Nutrition Reviewed, Carbohydrate meal planning, Supplemental nutrition  [Eat 3 meals a day with 30 grams of carbs and can have up to 2 snacks per day with <15 grams of carbs. Stressed importance of eating breakfast to prevent hypoglycemia between breakfast and lunch due to long acting insulin. Drinks Ensure.]  Pharmacy Interventions   Pharmacy Dicussed/Reviewed Pharmacy Topics Discussed, Pharmacy Topics Reviewed, Medications and their functions  [explained that [place] can lower glucose for up to 42 hours and it is very important to eat meals at regular intervals with about the same amount of carbohydrates]  Safety Interventions   Safety Discussed/Reviewed Safety Discussed, Safety Reviewed, Home Safety, Fall Risk  [safety concerns due to potential  for hypoglycemia expalined]       Follow up plan: Follow up call scheduled for 04/23/23    Encounter Outcome:  Pt. Visit Completed   Demetrios Loll, BSN, RN-BC RN Care Coordinator [facility]  [facility] Direct Dial: [phone] Main #: [phone]

## 2023-04-14 DIAGNOSIS — Z79899 Other long term (current) drug therapy: Secondary | ICD-10-CM | POA: Diagnosis not present

## 2023-04-14 DIAGNOSIS — E440 Moderate protein-calorie malnutrition: Secondary | ICD-10-CM | POA: Diagnosis not present

## 2023-04-14 DIAGNOSIS — R5383 Other fatigue: Secondary | ICD-10-CM | POA: Diagnosis not present

## 2023-04-14 DIAGNOSIS — E7800 Pure hypercholesterolemia, unspecified: Secondary | ICD-10-CM | POA: Diagnosis not present

## 2023-04-14 DIAGNOSIS — Z6830 Body mass index (BMI) 30.0-30.9, adult: Secondary | ICD-10-CM | POA: Diagnosis not present

## 2023-04-14 DIAGNOSIS — Z0000 Encounter for general adult medical examination without abnormal findings: Secondary | ICD-10-CM | POA: Diagnosis not present

## 2023-04-14 DIAGNOSIS — Z1331 Encounter for screening for depression: Secondary | ICD-10-CM | POA: Diagnosis not present

## 2023-04-14 DIAGNOSIS — Z1339 Encounter for screening examination for other mental health and behavioral disorders: Secondary | ICD-10-CM | POA: Diagnosis not present

## 2023-04-14 DIAGNOSIS — I10 Essential (primary) hypertension: Secondary | ICD-10-CM | POA: Diagnosis not present

## 2023-04-14 DIAGNOSIS — E6609 Other obesity due to excess calories: Secondary | ICD-10-CM | POA: Diagnosis not present

## 2023-04-14 DIAGNOSIS — Z299 Encounter for prophylactic measures, unspecified: Secondary | ICD-10-CM | POA: Diagnosis not present

## 2023-04-14 DIAGNOSIS — Z7189 Other specified counseling: Secondary | ICD-10-CM | POA: Diagnosis not present

## 2023-04-15 ENCOUNTER — Other Ambulatory Visit: Payer: Self-pay | Admitting: "Endocrinology

## 2023-04-19 ENCOUNTER — Other Ambulatory Visit: Payer: Self-pay | Admitting: "Endocrinology

## 2023-04-23 ENCOUNTER — Encounter: Payer: Self-pay | Admitting: *Deleted

## 2023-04-23 ENCOUNTER — Ambulatory Visit: Payer: Self-pay | Admitting: *Deleted

## 2023-04-23 NOTE — Patient Outreach (Addendum)
Care Coordination   Follow Up Visit Note   04/23/2023 Name: AVELYN BLUNCK MRN: [id number] DOB: 1951-02-16  [name] is a 72 y.o. year old female who sees Vyas, Angelina Pih, MD for primary care. I spoke with  [name] by phone today.  What matters to the patients health and wellness today?  Managing blood sugar    Goals Addressed             This Visit's Progress    Manage Blood Sugar   On track    Care Coordination Goals: Patient will follow-up with endocrinologist every 3 months or as recommended Dr Fransico Him 05/04/23 Patient will take medication as prescribed and reach out to provider with any negative side effects Patient will continue to monitor and record blood sugar 4 times per day and as needed with glucometer, and will call endocrinologist with any readings outside of recommended range Patient will take blood sugar log and meter to provider visits for review Patient will feat 3 meals a day at regular intervals with about the same amount of carbohydrates.  30 grams of carbohydrates per meal and less than 5 grams of carbohydrates in 1 or 2 snacks a day if needed Drink a supplement like Glucerna or boost if she's unable to eat a meal Patient will verbalize understanding of importance to eat meals at regular intervals while taking a long acting insulin to avoid hypoglycemia Patient will reach out to RN Care Coordinator [phone] with any care coordination or resource needs         SDOH assessments and interventions completed:  Yes  SDOH Interventions Today    Flowsheet Row Most Recent Value  SDOH Interventions   Transportation Interventions Intervention Not Indicated  Financial Strain Interventions Intervention Not Indicated        Care Coordination Interventions:  Yes, provided  Interventions Today    Flowsheet Row Most Recent Value  Chronic Disease   Chronic disease during today's visit Diabetes  General Interventions   General  Interventions Discussed/Reviewed General Interventions Discussed, General Interventions Reviewed, Labs, Durable Medical Equipment (DME), Doctor Visits  Labs Hgb A1c every 3 months  Doctor Visits Discussed/Reviewed Doctor Visits Discussed, Doctor Visits Reviewed, PCP, Specialist, Annual Wellness Visits  Durable Medical Equipment (DME) Glucomoter  [90 yesterday and this morning 117. None below 70 or above 200.]  PCP/Specialist Visits Compliance with follow-up visit  [Dr Nida on 05/04/23. Follow-up with PCP as recommended]  Exercise Interventions   Exercise Discussed/Reviewed Physical Activity  [Patient is able to perform ADLs. Encouraged increased acitivty as tolerated with a goal of 150 minutes of moderate physical activity per week]  Physical Activity Discussed/Reviewed Physical Activity Discussed, Physical Activity Reviewed  Education Interventions   Education Provided Provided Education, Provided Printed Education  [printed education on nutrition and diabetes and preventing hypoglycemia]  Provided Verbal Education On Nutrition, Blood Sugar Monitoring, Labs, When to see the doctor, Exercise, Medication  Labs Reviewed Hgb A1c  Nutrition Interventions   Nutrition Discussed/Reviewed Nutrition Discussed, Nutrition Reviewed, Carbohydrate meal planning, Portion sizes, Adding fruits and vegetables, Increasing proteins, Supplemental nutrition, Fluid intake  [Eat 3 meals a day with 30GM of CHO and can have up to 2 snacks per day with less than 15GM of CHO. Stressed importance of eating breakfast to prevent hypoglycemia between breakfast and lunch due to long acting insulin. Drinks Ensure.]  Pharmacy Interventions   Pharmacy Dicussed/Reviewed Pharmacy Topics Discussed, Pharmacy Topics Reviewed, Medications and their functions  [patient is going to  talk with endocrinologist at visit on 05/04/23 about blood medication dosage and blood sugar fluctuations]  Safety Interventions   Safety Discussed/Reviewed Safety  Discussed, Safety Reviewed, Fall Risk, Home Safety  [safety concerns due to potential for hypoglycemia stressed]        Follow up plan: Follow up call scheduled for 05/25/23    Encounter Outcome:  Pt. Visit Completed   Demetrios Loll, BSN, RN-BC RN Care Coordinator [facility]  [facility] Direct Dial: [phone] Main #: [phone]  *Encounter addended on 04/30/23 to reprint educational material on hypoglycemia per care guides request so that it can be mailed to the patient.

## 2023-05-04 ENCOUNTER — Encounter: Payer: Self-pay | Admitting: "Endocrinology

## 2023-05-04 ENCOUNTER — Ambulatory Visit: Payer: Medicare PPO | Admitting: "Endocrinology

## 2023-05-04 VITALS — BP 100/50 | HR 92 | Ht 60.0 in | Wt 165.2 lb

## 2023-05-04 DIAGNOSIS — E1169 Type 2 diabetes mellitus with other specified complication: Secondary | ICD-10-CM

## 2023-05-04 DIAGNOSIS — I10 Essential (primary) hypertension: Secondary | ICD-10-CM

## 2023-05-04 DIAGNOSIS — E782 Mixed hyperlipidemia: Secondary | ICD-10-CM

## 2023-05-04 DIAGNOSIS — Z794 Long term (current) use of insulin: Secondary | ICD-10-CM | POA: Diagnosis not present

## 2023-05-04 LAB — POCT GLYCOSYLATED HEMOGLOBIN (HGB A1C): HbA1c, POC (controlled diabetic range): 5.7 % (ref 0.0–7.0)

## 2023-05-04 MED ORDER — FREESTYLE LIBRE 3 SENSOR MISC: 1.0000 | 2 refills | Status: DC

## 2023-05-04 MED ORDER — FREESTYLE LIBRE 3 READER DEVI: 1.0000 | Freq: Once | 0 refills | Status: DC | PRN

## 2023-05-04 NOTE — Progress Notes (Signed)
05/04/2023, 1:18 PM  Endocrinology follow-up note   Subjective:    Patient ID: Alisha [name], female    DOB: March 17, 1951.  Alisha [name] is being seen in follow-up after she was seen in consultation for management of currently uncontrolled symptomatic diabetes requested by  Ignatius Specking, MD.   Past Medical History:  Diagnosis Date   B12 deficiency 11/03/2016   B12 deficiency 11/03/2016   Barrett's esophagus    Cirrhosis of liver without mention of alcohol    hep B surface antigen and HCV ab negative.pt has not had hepatitis A and B vaccines.U/S on 07/04/13 shows cirrhosis.   Diverticula of colon    pancolonic   Esophageal reflux    Esophageal varices (HCC)    Hiatal hernia    Iron deficiency anemia, unspecified    Other and unspecified hyperlipidemia    Portal hypertensive gastropathy (HCC)    Tubular adenoma    Type II or unspecified type diabetes mellitus without mention of complication, not stated as uncontrolled    Unspecified essential hypertension    Unspecified hemorrhoids without mention of complication     Past Surgical History:  Procedure Laterality Date   ABDOMINAL HYSTERECTOMY     BIOPSY  04/30/2016   Procedure: BIOPSY;  Surgeon: Corbin Ade, MD;  [facility];  Service: Endoscopy;;  esophagus   BIOPSY  02/24/2019   Procedure: BIOPSY;  Surgeon: Corbin Ade, MD;  [facility];  Service: Endoscopy;;   CESAREAN SECTION     x 2   COLONOSCOPY  03/2006   left sided diverticula, splenic flexure tublar adenoma   COLONOSCOPY  07/2002   villous tubular adenoma in rectum   COLONOSCOPY  09/23/09   external hemorrhoids/scattered pan colonic diverticula otherwise normal. cecal lipoma bx negative, normal TI. Next TCS 09/2014   COLONOSCOPY N/A 11/12/2014   Procedure: COLONOSCOPY;  Surgeon: Corbin Ade, MD;  [facility];  Service: Endoscopy;   Laterality: N/A;  1215 - moved to 12:30 - Ginger to notify pt   COLONOSCOPY N/A 01/07/2017   Rourk: Grade 4 internal hemorrhoids, 5 mm ulcer in the sigmoid colon (benign bx), 7 mm polyp removed from the cecum (tubular adenoma), diverticulosis.next tcs in five years   COLONOSCOPY WITH PROPOFOL N/A 04/01/2022   Procedure: COLONOSCOPY WITH PROPOFOL;  Surgeon: Corbin Ade, MD;  [facility];  Service: Endoscopy;  Laterality: N/A;  8:00am   ESOPHAGEAL BANDING N/A 12/20/2016   Procedure: ESOPHAGEAL BANDING;  Surgeon: West Bali, MD;  [facility];  Service: Endoscopy;  Laterality: N/A;   ESOPHAGEAL BANDING N/A 03/18/2017   Procedure: ESOPHAGEAL BANDING;  Surgeon: Corbin Ade, MD;  [facility];  Service: Endoscopy;  Laterality: N/A;   ESOPHAGEAL BANDING  12/17/2017   Procedure: ESOPHAGEAL BANDING;  Surgeon: Corbin Ade, MD;  [facility];  Service: Endoscopy;;   ESOPHAGEAL BANDING N/A 07/03/2020   Procedure: ESOPHAGEAL BANDING;  Surgeon: Corbin Ade, MD;  [facility];  Service: Endoscopy;  Laterality: N/A;   ESOPHAGEAL BANDING  04/01/2022   Procedure: ESOPHAGEAL BANDING;  Surgeon:  Corbin Ade, MD;  [facility];  Service: Endoscopy;;   ESOPHAGOGASTRODUODENOSCOPY  08/2006   barretts esophagus, no dyplasia   ESOPHAGOGASTRODUODENOSCOPY  03/2006   barretts esophagus,bx focal atypia c/w low grade dysplasia, SB bx negative for celiac   ESOPHAGOGASTRODUODENOSCOPY  09/23/09   3 columns of grade 2 esophageal varices/hiatal hernia/3-4 cm segment Barrett's esophagus without dysplasia. Next EGD 09/2012   ESOPHAGOGASTRODUODENOSCOPY N/A 11/11/2012   Dr. Jena Gauss- Barrett;s esophagus, esophageal varices, portal gastopathy. hiatal hernia   ESOPHAGOGASTRODUODENOSCOPY N/A 04/30/2016   Procedure: ESOPHAGOGASTRODUODENOSCOPY (EGD);  Surgeon: Corbin Ade, MD;  [facility];  Service: Endoscopy;  Laterality: N/A;  815    ESOPHAGOGASTRODUODENOSCOPY N/A 12/20/2016   Procedure: ESOPHAGOGASTRODUODENOSCOPY (EGD);  Surgeon: West Bali, MD;  [facility];  Service: Endoscopy;  Laterality: N/A;   ESOPHAGOGASTRODUODENOSCOPY N/A 01/07/2017   Procedure: ESOPHAGOGASTRODUODENOSCOPY (EGD);  Surgeon: Corbin Ade, MD;  [facility];  Service: Endoscopy;  Laterality: N/A;   ESOPHAGOGASTRODUODENOSCOPY N/A 03/18/2017   Procedure: ESOPHAGOGASTRODUODENOSCOPY (EGD);  Surgeon: Corbin Ade, MD;  [facility];  Service: Endoscopy;  Laterality: N/A;  1030    ESOPHAGOGASTRODUODENOSCOPY N/A 12/17/2017   Procedure: ESOPHAGOGASTRODUODENOSCOPY (EGD);  Surgeon: Corbin Ade, MD;  [facility];  Service: Endoscopy;  Laterality: N/A;  10:30am   ESOPHAGOGASTRODUODENOSCOPY N/A 02/24/2019   Rourk: No esophageal varices seen, scars noted from prior banding.  5 cm segment of salmon-colored epithelium coming up from the GE junction consistent with Barrett's (bx with no dysplasia).  Portal gastropathy.  Small hiatal hernia.  Recommended repeat EGD in 18 months.   ESOPHAGOGASTRODUODENOSCOPY N/A 07/03/2020   Procedure: ESOPHAGOGASTRODUODENOSCOPY (EGD);  Surgeon: Corbin Ade, MD;  [facility];  Service: Endoscopy;  Laterality: N/A;  10:15am   ESOPHAGOGASTRODUODENOSCOPY (EGD) WITH PROPOFOL N/A 04/01/2022   Procedure: ESOPHAGOGASTRODUODENOSCOPY (EGD) WITH PROPOFOL;  Surgeon: Corbin Ade, MD;  [facility];  Service: Endoscopy;  Laterality: N/A;   HEMORRHOID SURGERY  2008   POLYPECTOMY  04/01/2022   Procedure: POLYPECTOMY;  Surgeon: Corbin Ade, MD;  [facility];  Service: Endoscopy;;  ascending and descending   small bowel capsule endoscopy  10/2009   normal    Social History   Socioeconomic History   Marital status: Married    Spouse name: Not on file   Number of children: Not on file   Years of education: Not on file   Highest education level: Not on  file  Occupational History   Not on file  Tobacco Use   Smoking status: Never   Smokeless tobacco: Never  Vaping Use   Vaping status: Never Used  Substance and Sexual Activity   Alcohol use: No   Drug use: No   Sexual activity: Yes  Other Topics Concern   Not on file  Social History Narrative   Not on file   Social Determinants of Health   Financial Resource Strain: Low Risk  (04/23/2023)   Overall Financial Resource Strain (CARDIA)    Difficulty of Paying Living Expenses: Not hard at all  Food Insecurity: No Food Insecurity (04/16/2022)   Hunger Vital Sign    Worried About Running Out of Food in the Last Year: Never true    Ran Out of Food in the Last Year: Never true  Transportation Needs: No Transportation Needs (04/23/2023)   PRAPARE - Administrator, Civil Service (Medical): No    Lack of Transportation (Non-Medical): No  Physical Activity: Inactive (04/09/2023)   Exercise Vital Sign    Days of Exercise per Week: 0 days    Minutes of Exercise per Session: 0 min  Stress: Not on file  Social Connections: Not on file    Family History  Problem Relation Age of Onset   Alzheimer's disease Mother    CAD Father    Diabetes Mellitus II Father    Alzheimer's disease Father    Diabetes Mellitus II Sister    Colon cancer Sister 5       small, surgical excision; chemo/rad not needed   Cancer - Other Brother    Diabetes Mellitus II Brother    Hypertension Brother    Liver disease Neg Hx    Breast cancer Neg Hx     Outpatient Encounter Medications as of 05/04/2023  Medication Sig   Continuous Glucose Receiver (FREESTYLE LIBRE 3 READER) DEVI 1 Piece by Does not apply route once as needed for up to 1 dose.   Continuous Glucose Sensor (FREESTYLE LIBRE 3 SENSOR) MISC 1 Piece by Does not apply route every 14 (fourteen) days. Place 1 sensor on the skin every 14 days. Use to check glucose continuously   Accu-Chek Softclix Lancets lancets USE TO CHECK BLOOD SUGAR FOUR  TIMES DAILY. Dx: E11.65   Cholecalciferol (VITAMIN D) 2000 units tablet Take 2,000 Units by mouth daily.   insulin degludec (TRESIBA FLEXTOUCH) 200 UNIT/ML FlexTouch Pen INJECT 20 UNITS UNDER THE SKIN AT BEDTIME   lisinopril (PRINIVIL,ZESTRIL) 10 MG tablet Take 10 mg by mouth daily.   NOVOFINE 32G X 6 MM MISC USE ONE AS DIRECTED TWICE DAILY.   pantoprazole (PROTONIX) 40 MG tablet Take 1 tablet (40 mg total) by mouth 2 (two) times daily before a meal.   propranolol (INDERAL) 10 MG tablet TAKE THREE (3) TABLETS BY MOUTH EVERY MORNING AND 3 TABLETS EVERY EVENING   Semaglutide, 2 MG/DOSE, (OZEMPIC, 2 MG/DOSE,) 8 MG/3ML SOPN INJECT 2MG  UNDER THE SKIN EVERY WEEK   [DISCONTINUED] Continuous Blood Gluc Receiver (FREESTYLE LIBRE 2 READER) DEVI As directed   [DISCONTINUED] Continuous Blood Gluc Sensor (FREESTYLE LIBRE 2 SENSOR) MISC 1 Piece by Does not apply route every 14 (fourteen) days.   No facility-administered encounter medications on file as of 05/04/2023.    ALLERGIES: Allergies  Allergen Reactions   Codeine Nausea And Vomiting    VACCINATION STATUS: Immunization History  Administered Date(s) Administered   Hepatitis B, ADULT 07/21/2017, 12/14/2017, 08/16/2018   Influenza Split 07/07/2007   Influenza, High Dose Seasonal PF 07/04/2017, 06/19/2018, 05/23/2019, 05/23/2019, 07/29/2022   Influenza-Unspecified 07/04/2017   Moderna Sars-Covid-2 Vaccination 11/14/2019, 12/12/2019, 07/17/2020   Pneumococcal Conjugate-13 03/21/2019   Pneumococcal Polysaccharide-23 07/07/2007, 07/25/2019    Diabetes She presents for her follow-up diabetic visit. She has type 2 diabetes mellitus. Onset time: She was diagnosed at approximate age of 39 years. Her disease course has been stable. There are no hypoglycemic associated symptoms. Pertinent negatives for hypoglycemia include no confusion, headaches, pallor or seizures. Pertinent negatives for diabetes include no chest pain, no fatigue, no polydipsia, no  polyphagia and no polyuria. (She did not bring any logs nor meter to review.  Patient verbally reports that she is having fluctuating glycemic profile including hypoglycemia.) There are no hypoglycemic complications. Symptoms are stable. Diabetic complications include nephropathy. Risk factors for coronary artery disease include diabetes mellitus, hypertension, obesity, post-menopausal and family history. Her weight is decreasing steadily. She is following a generally unhealthy diet. When asked about meal planning, she reported  none. She has had a previous visit with a dietitian. She participates in exercise intermittently. Her home blood glucose trend is fluctuating minimally. Her breakfast blood glucose range is generally 130-140 mg/dl. Her lunch blood glucose range is generally 130-140 mg/dl. Her dinner blood glucose range is generally 130-140 mg/dl. Her bedtime blood glucose range is generally 130-140 mg/dl. Her overall blood glucose range is 130-140 mg/dl. Alisha [name] presents with continued improvement in her glycemic profile.  Her AGP report shows 81% time in range, 18% level 1 hyperglycemia.  Her average blood glucose is 149 mg per DL for the last 14 days.  Her point-of-care A1c is 5.7%.  She did not document significant hypoglycemia.    ) An ACE inhibitor/angiotensin II receptor blocker is being taken. Eye exam is current.  Hypertension This is a chronic problem. The current episode started more than 1 year ago. The problem is controlled. Pertinent negatives include no chest pain, headaches, palpitations or shortness of breath. Risk factors for coronary artery disease include diabetes mellitus, family history, obesity, dyslipidemia, sedentary lifestyle and post-menopausal state. Past treatments include ACE inhibitors. Hypertensive end-organ damage includes kidney disease. Identifiable causes of hypertension include chronic renal disease.     Review of Systems  Constitutional:  Negative for chills,  fatigue, fever and unexpected weight change.  HENT:  Negative for trouble swallowing and voice change.   Eyes:  Negative for visual disturbance.  Respiratory:  Negative for cough, shortness of breath and wheezing.   Cardiovascular:  Negative for chest pain, palpitations and leg swelling.  Gastrointestinal:  Negative for diarrhea, nausea and vomiting.  Endocrine: Negative for cold intolerance, heat intolerance, polydipsia, polyphagia and polyuria.  Musculoskeletal:  Negative for arthralgias and myalgias.  Skin:  Negative for color change, pallor, rash and wound.  Neurological:  Negative for seizures and headaches.  Psychiatric/Behavioral:  Negative for confusion and suicidal ideas.     Objective:       05/04/2023   11:13 AM 03/09/2023    2:23 PM 11/24/2022   12:57 PM  Vitals with BMI  Height 5\' 0"  5\' 0"  5\' 0"   Weight 165 lbs 3 oz 165 lbs 5 oz 173 lbs  BMI 32.26 32.28 33.79  Systolic 100 104 960  Diastolic 50 48 57  Pulse 92 73 70    BP (!) 100/50   Pulse 92   Ht 5' (1.524 m)   Wt 165 lb 3.2 oz (74.9 kg)   BMI 32.26 kg/m   Wt Readings from Last 3 Encounters:  05/04/23 165 lb 3.2 oz (74.9 kg)  03/09/23 165 lb 4.8 oz (75 kg)  11/24/22 173 lb (78.5 kg)      CMP ( most recent) CMP     Component Value Date/Time   NA 136 03/09/2023 1607   K 3.9 03/09/2023 1607   CL 106 03/09/2023 1607   CO2 23 03/09/2023 1607   GLUCOSE 134 (H) 03/09/2023 1607   BUN 15 03/09/2023 1607   BUN 15 01/25/2020 0000   CREATININE 1.68 (H) 03/09/2023 1607   CREATININE 1.62 (H) 03/28/2018 1146   CALCIUM 9.2 03/09/2023 1607   CALCIUM 8.8 10/16/2021 1354   PROT 6.2 (L) 10/27/2022 1152   ALBUMIN 3.3 (L) 03/09/2023 1607   AST 35 10/27/2022 1152   ALT 16 10/27/2022 1152   ALKPHOS 78 10/27/2022 1152   BILITOT 3.1 (H) 10/27/2022 1152   GFRNONAA 32 (L) 03/09/2023 1607   GFRAA 36 (L) 05/29/2020 1416     Diabetic Labs (most recent): Lab  Results  Component Value Date   HGBA1C 5.7 05/04/2023    HGBA1C 5.0 11/03/2022   HGBA1C 5.0 08/03/2022   MICROALBUR 30 03/24/2022   MICROALBUR 25.8 (H) 12/23/2021   MICROALBUR 9.6 (H) 06/20/2021     Lipid Panel ( most recent) Lipid Panel     Component Value Date/Time   CHOL 157 10/27/2022 1152   TRIG 98 10/27/2022 1152   HDL 43 10/27/2022 1152   CHOLHDL 3.7 10/27/2022 1152   VLDL 20 10/27/2022 1152   LDLCALC 94 10/27/2022 1152      Lab Results  Component Value Date   TSH 2.448 10/27/2022   TSH 2.577 11/24/2021   TSH 2.531 12/20/2020   TSH 2.46 01/25/2020   FREET4 1.12 10/27/2022   FREET4 0.92 11/24/2021   FREET4 0.97 12/20/2020      Assessment & Plan:   1. Uncontrolled type 2 diabetes mellitus with stage 3 chronic kidney disease (HCC)  - Alisha [name] has currently uncontrolled symptomatic type 2 DM since 72 years of age.  Alisha [name] presents with continued improvement in her glycemic profile.  Her AGP report shows 81% time in range, 18% level 1 hyperglycemia.  Her average blood glucose is 149 mg per DL for the last 14 days.  Her point-of-care A1c is 5.7%.  She did not document significant hypoglycemia.    - I had a long discussion with her about the progressive nature of diabetes and the pathology behind its complications. -her diabetes is complicated by CKD and she remains at a high risk for more acute and chronic complications which include CAD, CVA, CKD, retinopathy, and neuropathy. These are all discussed in detail with her.  - I have counseled her on diet  and weight management  by adopting a carbohydrate restricted/protein rich diet. Patient is encouraged to switch to  unprocessed or minimally processed     complex starch and increased protein intake (animal or plant source), fruits, and vegetables. -  she is advised to stick to a routine mealtimes to eat 3 meals  a day and avoid unnecessary snacks ( to snack only to correct hypoglycemia).   Evidently, she is not utilizing her prescribed medication optimally.  She  is scans  1-2 on average daily which will not allow her to utilize intensive treatment with basal/bolus insulin.   - she acknowledges that there is a room for improvement in her food and drink choices. - Suggestion is made for her to avoid simple carbohydrates  from her diet including Cakes, Sweet Desserts, Ice Cream, Soda (diet and regular), Sweet Tea, Candies, Chips, Cookies, Store Bought Juices, Alcohol , Artificial Sweeteners,  Coffee Creamer, and "Sugar-free" Products, Lemonade. This will help patient to have more stable blood glucose profile and potentially avoid unintended weight gain.  The following Lifestyle Medicine recommendations according to American College of Lifestyle Medicine  [facility]) were discussed and and offered to patient and she  agrees to start the journey:  A. Whole Foods, Plant-Based Nutrition comprising of fruits and vegetables, plant-based proteins, whole-grain carbohydrates was discussed in detail with the patient.   A list for source of those nutrients were also provided to the patient.  Patient will use only water or unsweetened tea for hydration. B.  The need to stay away from risky substances including alcohol, smoking; obtaining 7 to 9 hours of restorative sleep, at least 150 minutes of moderate intensity exercise weekly, the importance of healthy social connections,  and stress management techniques were discussed. C.  A  full color page of  Calorie density of various food groups per pound showing examples of each food groups was provided to the patient.   - I have approached her with the following individualized plan to manage  her diabetes and patient agrees:    -She presents with continued engagement and improvement in her heart, she will not need prandial insulin in order.  She is advised to continue Tresiba 20 units nightly associated with proper utility of her CGM device. She would benefit from an upgraded freestyle libre, discussed and prescribed freestyle  libre 3 device for her.  -She is benefiting from her GLP-1 receptor agonist, advised to continue Ozempic 2 mg subcutaneously weekly.  Side effects and precautions discussed with her. - she is not a candidate for Metformin, due to concurrent renal insufficiency.  - Specific targets for  A1c;  LDL, HDL,  and Triglycerides were discussed with the patient.  2) Blood Pressure /Hypertension:  -Her blood pressure is controlled to target. she is advised to continue her current medications including lisinopril 10 mg p.o. daily with breakfast .   3) Lipids/Hyperlipidemia:   Review of her recent lipid panel showed uncontrolled LDL at 94.     she  is not on statins.  She reports statin intolerance.  Whole food plant-based diet discussed and recommended above will help with dyslipidemia as well.  We considered for fasting lipid panel before her next visit.    4)  Weight/Diet:  Body mass index is 32.26 kg/m.  -Presents kept off the  15 pounds weight loss,    she is  a candidate for weight loss. I discussed with her the fact that loss of 5 - 10% of her  current body weight will have the most impact on her diabetes management.  Exercise, and detailed carbohydrates information provided  -  detailed on discharge instructions.  5) Chronic Care/Health Maintenance:  -she  is on ACEI Statin medications and  is encouraged to initiate and continue to follow up with Ophthalmology, Dentist,  Podiatrist at least yearly or according to recommendations, and advised to   stay away from smoking. I have recommended yearly flu vaccine and pneumonia vaccine at least every 5 years; moderate intensity exercise for up to 150 minutes weekly; and  sleep for at least 7 hours a day.  6.  Vitamin D deficiency: She is advised to continue cholecalciferol 2000 units daily.   She recently had normal ABI, will be repeated in 5 years.  - she is  advised to maintain close follow up with Ignatius Specking, MD for primary care needs, as well as  her other providers for optimal and coordinated care.   I spent  26  minutes in the care of the patient today including review of labs from CMP, Lipids, Thyroid Function, Hematology (current and previous including abstractions from other facilities); face-to-face time discussing  her blood glucose readings/logs, discussing hypoglycemia and hyperglycemia episodes and symptoms, medications doses, her options of short and long term treatment based on the latest standards of care / guidelines;  discussion about incorporating lifestyle medicine;  and documenting the encounter. Risk reduction counseling performed per USPSTF guidelines to reduce besity and cardiovascular risk factors.     Please refer to Patient Instructions for Blood Glucose Monitoring and Insulin/Medications Dosing Guide"  in media tab for additional information. Please  also refer to " Patient Self Inventory" in the Media  tab for reviewed elements of pertinent patient history.  Alisha [name] participated  in the discussions, expressed understanding, and voiced agreement with the above plans.  All questions were answered to her satisfaction. she is encouraged to contact clinic should she have any questions or concerns prior to her return visit.   Follow up plan: - Return in about 4 months (around 09/03/2023) for Bring Meter/CGM Device/Logs- A1c in Office.  Marquis Lunch, MD [facility] [address] [place], [place] [address] Phone: [phone]  Fax: [phone]    05/04/2023, 1:18 PM  This note was partially dictated with voice recognition software. Similar sounding words can be transcribed inadequately or may not  be corrected upon review.

## 2023-05-09 DIAGNOSIS — E1165 Type 2 diabetes mellitus with hyperglycemia: Secondary | ICD-10-CM | POA: Diagnosis not present

## 2023-05-12 DIAGNOSIS — K7469 Other cirrhosis of liver: Secondary | ICD-10-CM | POA: Diagnosis not present

## 2023-05-12 DIAGNOSIS — C220 Liver cell carcinoma: Secondary | ICD-10-CM | POA: Diagnosis not present

## 2023-05-25 ENCOUNTER — Ambulatory Visit: Payer: Self-pay | Admitting: *Deleted

## 2023-05-25 DIAGNOSIS — H43813 Vitreous degeneration, bilateral: Secondary | ICD-10-CM | POA: Diagnosis not present

## 2023-05-25 LAB — HM DIABETES EYE EXAM

## 2023-05-25 NOTE — Patient Outreach (Signed)
  Care Coordination   05/25/2023 Name: Alisha [name] MRN: [id number] DOB: Nov 12, 1950   Care Coordination Outreach Attempts:  An unsuccessful telephone outreach was attempted for a scheduled appointment today.  Follow Up Plan:  Additional outreach attempts will be made to offer the patient care coordination information and services.   Encounter Outcome:  No Answer. Left HIPAA compliant VM.    Care Coordination Interventions:  No, not indicated. Staff message to scheduling care guide to reach out and reschedule.    Demetrios Loll, RN, BSN Care Management Coordinator [facility]  [facility] Direct Dial: [phone] Main #: [phone]

## 2023-05-27 DIAGNOSIS — Z8505 Personal history of malignant neoplasm of liver: Secondary | ICD-10-CM | POA: Diagnosis not present

## 2023-05-27 DIAGNOSIS — K7469 Other cirrhosis of liver: Secondary | ICD-10-CM | POA: Diagnosis not present

## 2023-05-28 ENCOUNTER — Inpatient Hospital Stay: Payer: Medicare PPO | Attending: Hematology

## 2023-05-28 DIAGNOSIS — D509 Iron deficiency anemia, unspecified: Secondary | ICD-10-CM | POA: Diagnosis not present

## 2023-05-28 DIAGNOSIS — E538 Deficiency of other specified B group vitamins: Secondary | ICD-10-CM

## 2023-05-28 DIAGNOSIS — D696 Thrombocytopenia, unspecified: Secondary | ICD-10-CM | POA: Diagnosis not present

## 2023-05-28 DIAGNOSIS — D500 Iron deficiency anemia secondary to blood loss (chronic): Secondary | ICD-10-CM

## 2023-05-28 LAB — IRON AND TIBC
Iron: 174 ug/dL — ABNORMAL HIGH (ref 28–170)
Saturation Ratios: 74 % — ABNORMAL HIGH (ref 10.4–31.8)
TIBC: 235 ug/dL — ABNORMAL LOW (ref 250–450)
UIBC: 61 ug/dL

## 2023-05-28 LAB — COMPREHENSIVE METABOLIC PANEL
ALT: 17 U/L (ref 0–44)
AST: 33 U/L (ref 15–41)
Albumin: 3.2 g/dL — ABNORMAL LOW (ref 3.5–5.0)
Alkaline Phosphatase: 71 U/L (ref 38–126)
Anion gap: 4 — ABNORMAL LOW (ref 5–15)
BUN: 21 mg/dL (ref 8–23)
CO2: 23 mmol/L (ref 22–32)
Calcium: 9 mg/dL (ref 8.9–10.3)
Chloride: 106 mmol/L (ref 98–111)
Creatinine, Ser: 1.77 mg/dL — ABNORMAL HIGH (ref 0.44–1.00)
GFR, Estimated: 30 mL/min — ABNORMAL LOW (ref >60)
Glucose, Bld: 171 mg/dL — ABNORMAL HIGH (ref 70–99)
Potassium: 4.5 mmol/L (ref 3.5–5.1)
Sodium: 133 mmol/L — ABNORMAL LOW (ref 135–145)
Total Bilirubin: 3.9 mg/dL — ABNORMAL HIGH (ref 0.3–1.2)
Total Protein: 6.4 g/dL — ABNORMAL LOW (ref 6.5–8.1)

## 2023-05-28 LAB — CBC WITH DIFFERENTIAL/PLATELET
Abs Immature Granulocytes: 0.02 10*3/uL (ref 0.00–0.07)
Basophils Absolute: 0 10*3/uL (ref 0.0–0.1)
Basophils Relative: 0 %
Eosinophils Absolute: 0.1 10*3/uL (ref 0.0–0.5)
Eosinophils Relative: 2 %
HCT: 33.2 % — ABNORMAL LOW (ref 36.0–46.0)
Hemoglobin: 11.2 g/dL — ABNORMAL LOW (ref 12.0–15.0)
Immature Granulocytes: 0 %
Lymphocytes Relative: 24 %
Lymphs Abs: 1.1 10*3/uL (ref 0.7–4.0)
MCH: 31.5 pg (ref 26.0–34.0)
MCHC: 33.7 g/dL (ref 30.0–36.0)
MCV: 93.3 fL (ref 80.0–100.0)
Monocytes Absolute: 0.3 10*3/uL (ref 0.1–1.0)
Monocytes Relative: 7 %
Neutro Abs: 3 10*3/uL (ref 1.7–7.7)
Neutrophils Relative %: 67 %
Platelets: 61 10*3/uL — ABNORMAL LOW (ref 150–400)
RBC: 3.56 MIL/uL — ABNORMAL LOW (ref 3.87–5.11)
RDW: 15 % (ref 11.5–15.5)
WBC: 4.6 10*3/uL (ref 4.0–10.5)
nRBC: 0 % (ref 0.0–0.2)

## 2023-05-28 LAB — VITAMIN B12: Vitamin B-12: 513 pg/mL (ref 180–914)

## 2023-05-28 LAB — FERRITIN: Ferritin: 212 ng/mL (ref 11–307)

## 2023-05-31 ENCOUNTER — Telehealth: Payer: Self-pay | Admitting: *Deleted

## 2023-05-31 LAB — METHYLMALONIC ACID, SERUM: Methylmalonic Acid, Quantitative: 205 nmol/L (ref 0–378)

## 2023-05-31 NOTE — Progress Notes (Signed)
Care Coordination Note  05/31/2023 Name: Alisha [name] MRN: [id number] DOB: 1951-07-28  Alisha [name] is a 72 y.o. year old female who is a primary care patient of Doreen Beam B, MD and is actively engaged with the care management team. I reached out to Alisha [name] by phone today to assist with re-scheduling a follow up visit with the RN Case Manager  Follow up plan: Unsuccessful telephone outreach attempt made. A HIPAA compliant phone message was left for the patient providing contact information and requesting a return call.   [facility]  Care Coordination Care Guide  Direct Dial: [phone]

## 2023-06-04 ENCOUNTER — Inpatient Hospital Stay (HOSPITAL_BASED_OUTPATIENT_CLINIC_OR_DEPARTMENT_OTHER): Payer: Medicare PPO | Admitting: Oncology

## 2023-06-04 ENCOUNTER — Encounter: Payer: Self-pay | Admitting: Oncology

## 2023-06-04 ENCOUNTER — Other Ambulatory Visit: Payer: Self-pay

## 2023-06-04 DIAGNOSIS — D696 Thrombocytopenia, unspecified: Secondary | ICD-10-CM

## 2023-06-04 DIAGNOSIS — E538 Deficiency of other specified B group vitamins: Secondary | ICD-10-CM | POA: Diagnosis not present

## 2023-06-04 DIAGNOSIS — D500 Iron deficiency anemia secondary to blood loss (chronic): Secondary | ICD-10-CM | POA: Diagnosis not present

## 2023-06-04 NOTE — Progress Notes (Signed)
[facility] [address], [place] [address]   CLINIC:  Medical Oncology/Hematology  PCP:  Ignatius Specking, MD [address] [place] [address] [phone]  I connected with Alisha [name] on 06/04/23 at  2:00 PM EDT by telephone visit and verified that I am speaking with the correct person using two identifiers.   I discussed the limitations, risks, security and privacy concerns of performing an evaluation and management service by telemedicine and the availability of in-person appointments. I also discussed with the patient that there may be a patient responsible charge related to this service. The patient expressed understanding and agreed to proceed.   Other persons participating in the visit and their role in the encounter: NP, Patient    Patient's location: Home  Provider's location: Clinic    REASON FOR VISIT:  Follow-up for iron deficiency anemia (intermittent GI bleeding) and thrombocytopenia (cirrhosis)   CURRENT THERAPY: Intermittent IV iron infusions   INTERVAL HISTORY:   Alisha [name] 72 y.o. female returns for routine follow-up of her iron deficiency anemia (intermittent GI bleeding) and thrombocytopenia (cirrhosis/splenomegaly).  She was last seen by Rojelio Brenner PA-C on 11/24/22.    Has been followed with [facility] transplant and was not placed on the wait list in 2018 due to low MELD score and poor control diabetes.  She currently has had good improvement in her blood sugars however she has had a lung nodule concerning for malignancy therefore she has been deemed to not be a candidate for liver transplantation.  Her case has been discussed with interventional radiology as well as transplant surgery given concern for [facility] as she has had an LR for mass in the past treated with Y90 radioembolization December 2022 and has regular MRIs performed.  Her AFP has continued to rise and have been concerning for advancing [facility] therefore they have plans to repeat  her MRI along with a CT of her chest in October 2024 to follow-up on lung nodule.  Scan from 03/01/2023 showed stable lung mass. She has reported multiple biopsies in the past in regards to lung nodule that were negative but she continues to follow with Dr. Glenna Durand.  EGD up-to-date from July 2023 s/p banding of varices and has been maintained on propranolol twice daily.  She is achieving better control of her blood sugars as of recently.    Her chronic fatigue is at baseline. She has not had any GI bleeding events or esophageal banding since her last visit.  She denies any recent signs of bleeding such as hematemesis, hematochezia, melena, or epistaxis. She denies any significant bruising or petechial rash.  She has not noticed any pica, restless legs, headache, chest pain, dyspnea on exertion, lightheadedness, or syncope. No B symptoms such as fever, chills, night sweats, unintentional weight loss.   She has 75% energy and 25% appetite. She endorses weight loss secondary to Ozempic.  Reports she is trying to incorporate high-protein shakes and food into her diet.   ASSESSMENT & PLAN:  1.  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  Creatinine 1.77.  Hemoglobin 11.2. -No indication for IV or p.o. iron replacement at this time.   2.  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  -Denies any bleeding or signs of blood loss. -Recommend surveillance every 6 months or so with CBC.  Would consider a bone marrow biopsy if platelets drop below 50 consistently.  3.  Vitamin B12 deficiency - Most recent B12 level from 05/28/2023 is 513 with normal methylmalonic acid. -She is currently not taking B12 supplements but levels are maintaining.  4.  CKD stage IIIb/IV - Creatinine stable at baseline 1.77 (1.68).  - Follows with Dr. Wolfgang Phoenix.   5.  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 -Recently had MRI abdomen on 01/27/2023 which showed stable posttreatment changes within segment 7 without suspicious arterial hyperenhancement washout.  Morphological cirrhosis and portal hypertension noting splenomegaly and abdominal varices.  No new suspicious lesion. -Repeat MRI abdomen from 05/14/2023 shows stable posttreatment changes within segment 7 without suspicious arterial  hyperenhancement or washout (LR TR nonprogressing). -MELD score 28 as of 01/27/2023.   6.  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 Nada Boozer and Dr. Glenna Durand) at [facility] on 10/19/2022 for evaluation of her slowly enlarging pulmonary mass in the RML as well as rising AFP suspicious for [facility] (s/p radioembolization with IR).  She was recommended for biopsy via bronchoscopy with mediastinal staging, which patient has for the time being declined while  she "gives it some more thought." -Had repeat CT chest on 03/01/2023 which showed unchanged masslike consolidation with peripheral groundglass in the  right middle lobe, slightly increased as compared to remote prior exams, remains suspicious for adenocarcinoma. Unchanged 9 mm ground glass nodule in the right lower lobe, which may represent an indolent adenocarcinoma.  -She has follow-up in 3 months.  7.  Other history - PMH: Cirrhosis, chronic kidney disease, congestive heart failure, type 2 diabetes, hypertension - SOCIAL: Lifelong non-smoker.  No tobacco, alcohol, illicit drug use.  PLAN SUMMARY: >> RTC in 6 months (CBC/D, CMP, ferritin, iron/TIBC, B12, MMA). >> PHONE visit 1 week after labs      REVIEW OF SYSTEMS:   Review of Systems  Constitutional:  Positive for fatigue. Negative for appetite change, fever and unexpected weight change.  HENT:   Negative for nosebleeds, sore throat and trouble swallowing.   Eyes: Negative.   Respiratory: Negative.  Negative for cough, shortness of breath and wheezing.   Cardiovascular: Negative.  Negative for chest pain and leg swelling.  Gastrointestinal:  Negative for abdominal pain, blood in stool, constipation, diarrhea, nausea and vomiting.  Endocrine: Negative.   Genitourinary: Negative.  Negative for bladder incontinence, hematuria and nocturia.   Musculoskeletal: Negative.  Negative for back pain and flank pain.  Skin: Negative.   Neurological:  Negative.  Negative for dizziness, headaches, light-headedness and numbness.  Hematological: Negative.   Psychiatric/Behavioral: Negative.  Negative for confusion. The patient is not nervous/anxious.      PHYSICAL EXAM:  ECOG PERFORMANCE STATUS: 1 - Symptomatic but completely ambulatory  There were no vitals filed for this visit. There were no vitals filed for this visit. Physical Exam Neurological:     Mental Status: She is alert and oriented to person, place, and time.     PAST  MEDICAL/SURGICAL HISTORY:  Past Medical History:  Diagnosis Date   B12 deficiency 11/03/2016   B12 deficiency 11/03/2016   Barrett's esophagus    Cirrhosis of liver without mention of alcohol    hep B surface antigen and HCV ab negative.pt has not had hepatitis A and B vaccines.U/S on 07/04/13 shows cirrhosis.   Diverticula of colon    pancolonic   Esophageal reflux    Esophageal varices (HCC)    Hiatal hernia    Iron deficiency anemia, unspecified    Other and unspecified hyperlipidemia    Portal hypertensive gastropathy (HCC)    Tubular adenoma    Type II or unspecified type diabetes mellitus without mention of complication, not stated as uncontrolled    Unspecified essential hypertension    Unspecified hemorrhoids without mention of complication    Past Surgical History:  Procedure Laterality Date   ABDOMINAL HYSTERECTOMY     BIOPSY  04/30/2016   Procedure: BIOPSY;  Surgeon: Corbin Ade, MD;  [facility];  Service: Endoscopy;;  esophagus   BIOPSY  02/24/2019   Procedure: BIOPSY;  Surgeon: Corbin Ade, MD;  [facility];  Service: Endoscopy;;   CESAREAN SECTION     x 2   COLONOSCOPY  03/2006   left sided diverticula, splenic flexure tublar adenoma   COLONOSCOPY  07/2002   villous tubular adenoma in rectum   COLONOSCOPY  09/23/09   external hemorrhoids/scattered pan colonic diverticula otherwise normal. cecal lipoma bx negative, normal TI. Next TCS 09/2014   COLONOSCOPY N/A 11/12/2014   Procedure: COLONOSCOPY;  Surgeon: Corbin Ade, MD;  [facility];  Service: Endoscopy;  Laterality: N/A;  1215 - moved to 12:30 - Ginger to notify pt   COLONOSCOPY N/A 01/07/2017   Rourk: Grade 4 internal hemorrhoids, 5 mm ulcer in the sigmoid colon (benign bx), 7 mm polyp removed from the cecum (tubular adenoma), diverticulosis.next tcs in five years   COLONOSCOPY WITH PROPOFOL N/A 04/01/2022   Procedure: COLONOSCOPY WITH PROPOFOL;  Surgeon: Corbin Ade,  MD;  [facility];  Service: Endoscopy;  Laterality: N/A;  8:00am   ESOPHAGEAL BANDING N/A 12/20/2016   Procedure: ESOPHAGEAL BANDING;  Surgeon: West Bali, MD;  [facility];  Service: Endoscopy;  Laterality: N/A;   ESOPHAGEAL BANDING N/A 03/18/2017   Procedure: ESOPHAGEAL BANDING;  Surgeon: Corbin Ade, MD;  [facility];  Service: Endoscopy;  Laterality: N/A;   ESOPHAGEAL BANDING  12/17/2017   Procedure: ESOPHAGEAL BANDING;  Surgeon: Corbin Ade, MD;  [facility];  Service: Endoscopy;;   ESOPHAGEAL BANDING N/A 07/03/2020   Procedure: ESOPHAGEAL BANDING;  Surgeon: Corbin Ade, MD;  [facility];  Service: Endoscopy;  Laterality: N/A;   ESOPHAGEAL BANDING  04/01/2022   Procedure: ESOPHAGEAL BANDING;  Surgeon: Corbin Ade, MD;  [facility];  Service: Endoscopy;;   ESOPHAGOGASTRODUODENOSCOPY  08/2006   barretts esophagus, no dyplasia   ESOPHAGOGASTRODUODENOSCOPY  03/2006   barretts esophagus,bx focal atypia c/w low grade dysplasia, SB bx negative for celiac   ESOPHAGOGASTRODUODENOSCOPY  09/23/09   3 columns of grade 2 esophageal varices/hiatal hernia/3-4 cm segment Barrett's esophagus without dysplasia. Next EGD 09/2012   ESOPHAGOGASTRODUODENOSCOPY N/A 11/11/2012   Dr. Jena Gauss- Barrett;s esophagus, esophageal varices, portal gastopathy. hiatal hernia   ESOPHAGOGASTRODUODENOSCOPY N/A 04/30/2016   Procedure: ESOPHAGOGASTRODUODENOSCOPY (EGD);  Surgeon: Corbin Ade, MD;  [facility];  Service: Endoscopy;  Laterality: N/A;  815   ESOPHAGOGASTRODUODENOSCOPY N/A 12/20/2016   Procedure: ESOPHAGOGASTRODUODENOSCOPY (EGD);  Surgeon: West Bali, MD;  [facility];  Service: Endoscopy;  Laterality: N/A;   ESOPHAGOGASTRODUODENOSCOPY N/A 01/07/2017   Procedure: ESOPHAGOGASTRODUODENOSCOPY (EGD);  Surgeon: Corbin Ade, MD;  [facility];  Service: Endoscopy;  Laterality: N/A;    ESOPHAGOGASTRODUODENOSCOPY N/A 03/18/2017   Procedure: ESOPHAGOGASTRODUODENOSCOPY (EGD);  Surgeon: Corbin Ade, MD;  [facility];  Service: Endoscopy;  Laterality: N/A;  1030    ESOPHAGOGASTRODUODENOSCOPY N/A 12/17/2017   Procedure: ESOPHAGOGASTRODUODENOSCOPY (EGD);  Surgeon: Corbin Ade, MD;  [facility];  Service: Endoscopy;  Laterality: N/A;  10:30am   ESOPHAGOGASTRODUODENOSCOPY N/A 02/24/2019   Rourk: No esophageal varices seen, scars noted from prior banding.  5 cm segment of salmon-colored epithelium coming up from the GE junction consistent with Barrett's (bx with no dysplasia).  Portal gastropathy.  Small hiatal hernia.  Recommended repeat EGD in 18 months.   ESOPHAGOGASTRODUODENOSCOPY N/A 07/03/2020   Procedure: ESOPHAGOGASTRODUODENOSCOPY (EGD);  Surgeon: Corbin Ade, MD;  [facility];  Service: Endoscopy;  Laterality: N/A;  10:15am   ESOPHAGOGASTRODUODENOSCOPY (EGD) WITH PROPOFOL N/A 04/01/2022   Procedure: ESOPHAGOGASTRODUODENOSCOPY (EGD) WITH PROPOFOL;  Surgeon: Corbin Ade, MD;  [facility];  Service: Endoscopy;  Laterality: N/A;   HEMORRHOID SURGERY  2008   POLYPECTOMY  04/01/2022   Procedure: POLYPECTOMY;  Surgeon: Corbin Ade, MD;  [facility];  Service: Endoscopy;;  ascending and descending   small bowel capsule endoscopy  10/2009   normal    SOCIAL HISTORY:  Social History   Socioeconomic History   Marital status: Married    Spouse name: Not on file   Number of children: Not on file   Years of education: Not on file   Highest education level: Not on file  Occupational History   Not on file  Tobacco Use   Smoking status: Never   Smokeless tobacco: Never  Vaping Use   Vaping status: Never Used  Substance and Sexual Activity   Alcohol use: No   Drug use: No   Sexual activity: Yes  Other Topics Concern   Not on file  Social History Narrative   Not on file   Social Determinants of Health    Financial Resource Strain: Low Risk  (04/23/2023)   Overall Financial Resource Strain (CARDIA)    Difficulty of Paying Living Expenses: Not hard at all  Food Insecurity: No Food Insecurity (04/16/2022)   Hunger Vital Sign    Worried About Running Out of Food in the Last Year: Never true    Ran Out of Food in the Last Year: Never true  Transportation Needs: No Transportation Needs (04/23/2023)   PRAPARE - Administrator, Civil Service (Medical): No    Lack of Transportation (Non-Medical): No  Physical Activity: Inactive (04/09/2023)   Exercise Vital Sign    Days of Exercise per Week: 0 days    Minutes  of Exercise per Session: 0 min  Stress: Not on file  Social Connections: Not on file  Intimate Partner Violence: Not on file    FAMILY HISTORY:  Family History  Problem Relation Age of Onset   Alzheimer's disease Mother    CAD Father    Diabetes Mellitus II Father    Alzheimer's disease Father    Diabetes Mellitus II Sister    Colon cancer Sister 76       small, surgical excision; chemo/rad not needed   Cancer - Other Brother    Diabetes Mellitus II Brother    Hypertension Brother    Liver disease Neg Hx    Breast cancer Neg Hx     CURRENT MEDICATIONS:  Outpatient Encounter Medications as of 06/04/2023  Medication Sig   Accu-Chek Softclix Lancets lancets USE TO CHECK BLOOD SUGAR FOUR TIMES DAILY. Dx: E11.65   Cholecalciferol (VITAMIN D) 2000 units tablet Take 2,000 Units by mouth daily.   Continuous Glucose Receiver (FREESTYLE LIBRE 3 READER) DEVI 1 Piece by Does not apply route once as needed for up to 1 dose.   Continuous Glucose Sensor (FREESTYLE LIBRE 3 SENSOR) MISC 1 Piece by Does not apply route every 14 (fourteen) days. Place 1 sensor on the skin every 14 days. Use to check glucose continuously   insulin degludec (TRESIBA FLEXTOUCH) 200 UNIT/ML FlexTouch Pen INJECT 20 UNITS UNDER THE SKIN AT BEDTIME   lisinopril (PRINIVIL,ZESTRIL) 10 MG tablet Take 10 mg by  mouth daily.   NOVOFINE 32G X 6 MM MISC USE ONE AS DIRECTED TWICE DAILY.   pantoprazole (PROTONIX) 40 MG tablet Take 1 tablet (40 mg total) by mouth 2 (two) times daily before a meal.   propranolol (INDERAL) 10 MG tablet TAKE THREE (3) TABLETS BY MOUTH EVERY MORNING AND 3 TABLETS EVERY EVENING   Semaglutide, 2 MG/DOSE, (OZEMPIC, 2 MG/DOSE,) 8 MG/3ML SOPN INJECT 2MG  UNDER THE SKIN EVERY WEEK   No facility-administered encounter medications on file as of 06/04/2023.    ALLERGIES:  Allergies  Allergen Reactions   Codeine Nausea And Vomiting    LABORATORY DATA:  I have reviewed the labs as listed.  CBC    Component Value Date/Time   WBC 4.6 05/28/2023 1255   RBC 3.56 (L) 05/28/2023 1255   HGB 11.2 (L) 05/28/2023 1255   HCT 33.2 (L) 05/28/2023 1255   PLT 61 (L) 05/28/2023 1255   MCV 93.3 05/28/2023 1255   MCH 31.5 05/28/2023 1255   MCHC 33.7 05/28/2023 1255   RDW 15.0 05/28/2023 1255   LYMPHSABS 1.1 05/28/2023 1255   MONOABS 0.3 05/28/2023 1255   EOSABS 0.1 05/28/2023 1255   BASOSABS 0.0 05/28/2023 1255      Latest Ref Rng & Units 05/28/2023   12:55 PM 03/09/2023    4:07 PM 11/24/2022    1:42 PM  CMP  Glucose 70 - 99 mg/dL 161  096  045   BUN 8 - 23 mg/dL 21  15  13    Creatinine 0.44 - 1.00 mg/dL 4.09  8.11  9.14   Sodium 135 - 145 mmol/L 133  136  135   Potassium 3.5 - 5.1 mmol/L 4.5  3.9  4.1   Chloride 98 - 111 mmol/L 106  106  106   CO2 22 - 32 mmol/L 23  23  25    Calcium 8.9 - 10.3 mg/dL 9.0  9.2  8.9   Total Protein 6.5 - 8.1 g/dL 6.4     Total Bilirubin 0.3 -  1.2 mg/dL 3.9     Alkaline Phos 38 - 126 U/L 71     AST 15 - 41 U/L 33     ALT 0 - 44 U/L 17       DIAGNOSTIC IMAGING:  I have independently reviewed the relevant imaging and discussed with the patient.   WRAP UP:  All questions were answered. The patient knows to call the clinic with any problems, questions or concerns.  Medical decision making: Moderate  Time spent on visit: I provided 25 minutes  of non face-to-face telephone visit time during this encounter, and > 50% was spent counseling as documented under my assessment & plan.   Mauro Kaufmann, NP  06/04/23 1:50 PM

## 2023-06-09 DIAGNOSIS — E1165 Type 2 diabetes mellitus with hyperglycemia: Secondary | ICD-10-CM | POA: Diagnosis not present

## 2023-06-09 NOTE — Progress Notes (Signed)
Care Coordination Note  06/09/2023 Name: Alisha [name] MRN: [id number] DOB: [birth year]  Alisha [name] is a 72 y.o. year old female who is a primary care patient of Doreen Beam B, MD and is actively engaged with the care management team. I reached out to Alisha [name] by phone today to assist with re-scheduling a follow up visit with the RN Case Manager  Follow up plan: Unsuccessful telephone outreach attempt made. A HIPAA compliant phone message was left for the patient providing contact information and requesting a return call.  We have been unable to make contact with the patient for follow up. The care management team is available to follow up with the patient after provider conversation with the patient regarding recommendation for care management engagement and subsequent re-referral to the care management team.   [facility] Coordination Care Guide  Direct Dial: [phone]

## 2023-06-10 DIAGNOSIS — K7469 Other cirrhosis of liver: Secondary | ICD-10-CM | POA: Diagnosis not present

## 2023-06-27 ENCOUNTER — Other Ambulatory Visit: Payer: Self-pay | Admitting: "Endocrinology

## 2023-06-29 DIAGNOSIS — R809 Proteinuria, unspecified: Secondary | ICD-10-CM | POA: Diagnosis not present

## 2023-06-29 DIAGNOSIS — K7460 Unspecified cirrhosis of liver: Secondary | ICD-10-CM | POA: Diagnosis not present

## 2023-06-29 DIAGNOSIS — N1832 Chronic kidney disease, stage 3b: Secondary | ICD-10-CM | POA: Diagnosis not present

## 2023-06-29 DIAGNOSIS — E211 Secondary hyperparathyroidism, not elsewhere classified: Secondary | ICD-10-CM | POA: Diagnosis not present

## 2023-06-29 DIAGNOSIS — D638 Anemia in other chronic diseases classified elsewhere: Secondary | ICD-10-CM | POA: Diagnosis not present

## 2023-06-29 DIAGNOSIS — D6959 Other secondary thrombocytopenia: Secondary | ICD-10-CM | POA: Diagnosis not present

## 2023-06-29 DIAGNOSIS — D631 Anemia in chronic kidney disease: Secondary | ICD-10-CM | POA: Diagnosis not present

## 2023-07-19 DIAGNOSIS — I10 Essential (primary) hypertension: Secondary | ICD-10-CM | POA: Diagnosis not present

## 2023-07-19 DIAGNOSIS — N1832 Chronic kidney disease, stage 3b: Secondary | ICD-10-CM | POA: Diagnosis not present

## 2023-07-19 DIAGNOSIS — D696 Thrombocytopenia, unspecified: Secondary | ICD-10-CM | POA: Diagnosis not present

## 2023-07-19 DIAGNOSIS — Z299 Encounter for prophylactic measures, unspecified: Secondary | ICD-10-CM | POA: Diagnosis not present

## 2023-07-19 DIAGNOSIS — N2581 Secondary hyperparathyroidism of renal origin: Secondary | ICD-10-CM | POA: Diagnosis not present

## 2023-07-19 DIAGNOSIS — E1165 Type 2 diabetes mellitus with hyperglycemia: Secondary | ICD-10-CM | POA: Diagnosis not present

## 2023-08-04 ENCOUNTER — Other Ambulatory Visit: Payer: Self-pay | Admitting: "Endocrinology

## 2023-08-11 ENCOUNTER — Other Ambulatory Visit: Payer: Self-pay | Admitting: "Endocrinology

## 2023-08-19 ENCOUNTER — Other Ambulatory Visit: Payer: Self-pay | Admitting: Internal Medicine

## 2023-08-26 DIAGNOSIS — H3563 Retinal hemorrhage, bilateral: Secondary | ICD-10-CM | POA: Diagnosis not present

## 2023-09-06 ENCOUNTER — Encounter: Payer: Self-pay | Admitting: "Endocrinology

## 2023-09-06 ENCOUNTER — Ambulatory Visit: Payer: Medicare PPO | Admitting: "Endocrinology

## 2023-09-06 ENCOUNTER — Ambulatory Visit: Payer: Medicare PPO | Admitting: Gastroenterology

## 2023-09-06 VITALS — BP 122/64 | HR 68 | Ht 60.0 in | Wt 164.2 lb

## 2023-09-06 DIAGNOSIS — I10 Essential (primary) hypertension: Secondary | ICD-10-CM | POA: Diagnosis not present

## 2023-09-06 DIAGNOSIS — E782 Mixed hyperlipidemia: Secondary | ICD-10-CM

## 2023-09-06 DIAGNOSIS — E66811 Obesity, class 1: Secondary | ICD-10-CM

## 2023-09-06 DIAGNOSIS — E1169 Type 2 diabetes mellitus with other specified complication: Secondary | ICD-10-CM | POA: Diagnosis not present

## 2023-09-06 DIAGNOSIS — Z6832 Body mass index (BMI) 32.0-32.9, adult: Secondary | ICD-10-CM | POA: Diagnosis not present

## 2023-09-06 DIAGNOSIS — Z794 Long term (current) use of insulin: Secondary | ICD-10-CM

## 2023-09-06 DIAGNOSIS — E6609 Other obesity due to excess calories: Secondary | ICD-10-CM | POA: Diagnosis not present

## 2023-09-06 LAB — POCT GLYCOSYLATED HEMOGLOBIN (HGB A1C): HbA1c, POC (controlled diabetic range): 5.5 % (ref 0.0–7.0)

## 2023-09-06 MED ORDER — TRESIBA FLEXTOUCH 200 UNIT/ML ~~LOC~~ SOPN: 10.0000 [IU] | PEN_INJECTOR | Freq: Every evening | SUBCUTANEOUS | 0 refills | Status: DC

## 2023-09-06 NOTE — Progress Notes (Deleted)
GI Office Note    Referring Provider: Ignatius Specking, MD Primary Care Physician:  Ignatius Specking, MD Primary Gastroenterologist: Gerrit Friends.Rourk, MD  Date:  09/06/2023  ID:  Alisha [name], DOB 08-18-1951, MRN [id number]   Chief Complaint   No chief complaint on file.   History of Present Illness  Alisha [name] is a 72 y.o. female with a history of *** presenting today with complaint of   EGD June 2020: -No active esophageal varices, scarring present -Barrett's esophagus s/p biopsy -Portal hypertensive gastropathy -Repeat EGD in 1.5 years   EGD October 2021: -No esophageal varices, scarring from prior banding -5 cm irregular tongues of Barrett's epithelium -Portal hypertensive gastropathy -Normal duodenum -Repeat EGD in 1.5 years for surveillance   OV 12/17/2021.  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  Reflux well-controlled on pantoprazole 40 mg daily and occasionally occasionally needing this for breakthrough.  Denied any dysphagia.  Noted to not be a liver transplant candidate.  Liver disease well compensated.  Maintain on NSBB.  Advised repeat EGD for surveillance of varices.  Did not recommend any further upper GI biopsies given underlying portal hypertension.  No change in medications.  Will schedule for EGD.   EGD July 2023: -4 columns of grade 2 gastric varices s/p banding -1 column of underlying tongues of Barrett's esophagus recently -Portal hypertensive gastropathy -Small hiatal hernia   Colonoscopy July 2023: -External hemorrhoids -2 polyps in the descending colon s/p hemostatic clip x 1 -Left-sided diverticulosis -Pathology revealed tubular adenomas -Repeat colonoscopy in 5 years   Has been following with [facility] with last office visit 01/27/23. Was previously evaluated in 2008 and not placed on the wait list as she was doing too well for transplant.  She has been following closely with transplant regarding management of  cirrhosis and HCC in the setting of elevated AFP.  At the time of her office visit she was feeling well and have been working closely with her endocrinologist for controlling her blood sugar and she had lost 8 pounds since January 2024 on Ozempic 2 mg weekly.  She denied any icterus, jaundice, confusion, abdominal swelling, peripheral edema, or tremors.  She has a history of lung nodule and she opted for 1 year surveillance over bronchoscopy and biopsy and is being followed with pulmonology/oncology.  Her case was discussed with Dr. Guido Sander and she was advised to have an MRI of her abdomen 01/27/2023 with LR-TR nonviable and repeat MRI in August 2024 with CT chest at the same time.  Noted to be on nonselective beta-blocker.  Recent EGD in July 2023.  Noted to be vaccinated for hepatitis B and history of immunity to hepatitis A.  Plan to follow-up with them in 3 months.   MRI abdomen 01/27/23: Impression:  Hepatic findings:  1.  Observation 1: Stable posttreatment changes within segment 7 without  suspicious arterial hyperenhancement washout. LR-TR, nonviable.  2.  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 No new suspicious lesion.    Reviewed from last note with [facility]: Underwent a IVC guided biopsy on June 06, 2020 was negative for malignancy, but did not demonstrate findings consistent with the segment 7 lesion, raising concern that mass was not sampled. RML consolidative mass noted to be enlarging on CT scan done 08/24/2020. PET CT scan showed minimal FDG avid RML consolidative opacity which is progressively increased in size compared to 2011 remains worrisome for malignancy. She underwent a CT-guided biopsy  11/09/2020. Pathology showed benign lung parenchyma but did not explain the opacity seen on imaging. Of the options reviewed with her, she has opted for short interval surveillance with imaging.  July 2022 she was found to have an L4-5 mass on MRI  with rising AFP.  Marginal transplant candidate due to comorbidities.    Last office visit 03/09/23.  Patient reported no coffee-ground emesis or hematemesis, occasional abdominal pain been constant, no signs of hepatic encephalopathy.  Mild constipation but usually BM 1/day.  GERD controlled with PPI.  Continues to have nausea and appetite but has been on Ozempic for several months.  Does eat at least 1 meal per day.  Not using any protein shakes.  Denies any pruritus or jaundice.  Advised continue propranolol 30 mg twice daily, PPI twice daily.  GERD diet lifestyle modifications reinforced.  Had plans for MRI of the abdomen with [facility] in August for [facility] surveillance.   MRI abdomen 05/12/2023: -Stable posttreatment changes within segment 7 without suspicious arterial hyperenhancement or washout (LR TR nonprogressing. -Nodular hepatic contour -Posttreatment changes within segment 7 focal hepatic lesions without arterial enhancement, area of hyperenhancement similar to prior imaging -Splenomegaly 16 cm -Evidence of portal hypertension -Sludge and gallstones present within the gallbladder -Multiple cystic lesions throughout the pancreas, not significantly changed compared to prior with largest measuring up to 1 cm  Per her last note with [facility], plan for repeat MRI in 3 months as well as updated MELD with AFP.  They made her a follow-up visit for 3 months as well.  Today:  Cirrhosis history Hematemesis/coffee ground emesis: *** History of variceal bleeding: *** Abdominal pain: *** Abdominal distention/worsening ascites: *** Fever/chills: *** Episodes of confusion/disorientation: *** Number of daily bowel movements: *** Taking diuretics?: *** Date of last EGD: *** Prior history of banding?: *** Prior episodes of SBP: *** Last time liver imaging was performed: August 2024 [facility] surveillance), due December 2024  MELD Score ( / / ): ***  Computed MELD 3.0 unavailable. One or more  values for this score either were not found within the given timeframe or did not fit some other criterion. Computed MELD-Na unavailable. One or more values for this score either were not found within the given timeframe or did not fit some other criterion.     Wt Readings from Last 3 Encounters:  09/06/23 164 lb 3.2 oz (74.5 kg)  05/04/23 165 lb 3.2 oz (74.9 kg)  03/09/23 165 lb 4.8 oz (75 kg)    Current Outpatient Medications  Medication Sig Dispense Refill   Accu-Chek Softclix Lancets lancets USE TO CHECK BLOOD SUGAR FOUR TIMES DAILY. Dx: E11.65 200 each 2   Cholecalciferol (VITAMIN D) 2000 units tablet Take 2,000 Units by mouth daily.     Continuous Glucose Receiver (FREESTYLE LIBRE 3 READER) DEVI USE AS DIRECTED 1 each 0   Continuous Glucose Sensor (FREESTYLE LIBRE 3 SENSOR) MISC CHANGE SENSOR EVERY 14 DAYS AS DIRECTED 6 each 3   insulin degludec (TRESIBA FLEXTOUCH) 200 UNIT/ML FlexTouch Pen Inject 10 Units into the skin at bedtime. 9 mL 0   lisinopril (PRINIVIL,ZESTRIL) 10 MG tablet Take 10 mg by mouth daily.     NOVOFINE 32G X 6 MM MISC USE ONE AS DIRECTED TWICE DAILY.     pantoprazole (PROTONIX) 40 MG tablet Take 1 tablet (40 mg total) by mouth 2 (two) times daily before a meal. 60 tablet 1   propranolol (INDERAL) 10 MG tablet TAKE 3 TABLETS BY MOUTH AM AND TAKE  3 TABLETS BY MOUTH IN THE EVENING 540 tablet 1   Semaglutide, 2 MG/DOSE, (OZEMPIC, 2 MG/DOSE,) 8 MG/3ML SOPN INJECT 2MG  UNDER THE SKIN EVERY WEEK 8 mL 3   No current facility-administered medications for this visit.    Past Medical History:  Diagnosis Date   B12 deficiency 11/03/2016   B12 deficiency 11/03/2016   Barrett's esophagus    Cirrhosis of liver without mention of alcohol    hep B surface antigen and HCV ab negative.pt has not had hepatitis A and B vaccines.U/S on 07/04/13 shows cirrhosis.   Diverticula of colon    pancolonic   Esophageal reflux    Esophageal varices (HCC)    Hiatal hernia    Iron  deficiency anemia, unspecified    Other and unspecified hyperlipidemia    Portal hypertensive gastropathy (HCC)    Tubular adenoma    Type II or unspecified type diabetes mellitus without mention of complication, not stated as uncontrolled    Unspecified essential hypertension    Unspecified hemorrhoids without mention of complication     Past Surgical History:  Procedure Laterality Date   ABDOMINAL HYSTERECTOMY     BIOPSY  04/30/2016   Procedure: BIOPSY;  Surgeon: Corbin Ade, MD;  [facility];  Service: Endoscopy;;  esophagus   BIOPSY  02/24/2019   Procedure: BIOPSY;  Surgeon: Corbin Ade, MD;  [facility];  Service: Endoscopy;;   CESAREAN SECTION     x 2   COLONOSCOPY  03/2006   left sided diverticula, splenic flexure tublar adenoma   COLONOSCOPY  07/2002   villous tubular adenoma in rectum   COLONOSCOPY  09/23/09   external hemorrhoids/scattered pan colonic diverticula otherwise normal. cecal lipoma bx negative, normal TI. Next TCS 09/2014   COLONOSCOPY N/A 11/12/2014   Procedure: COLONOSCOPY;  Surgeon: Corbin Ade, MD;  [facility];  Service: Endoscopy;  Laterality: N/A;  1215 - moved to 12:30 - Ginger to notify pt   COLONOSCOPY N/A 01/07/2017   Rourk: Grade 4 internal hemorrhoids, 5 mm ulcer in the sigmoid colon (benign bx), 7 mm polyp removed from the cecum (tubular adenoma), diverticulosis.next tcs in five years   COLONOSCOPY WITH PROPOFOL N/A 04/01/2022   Procedure: COLONOSCOPY WITH PROPOFOL;  Surgeon: Corbin Ade, MD;  [facility];  Service: Endoscopy;  Laterality: N/A;  8:00am   ESOPHAGEAL BANDING N/A 12/20/2016   Procedure: ESOPHAGEAL BANDING;  Surgeon: West Bali, MD;  [facility];  Service: Endoscopy;  Laterality: N/A;   ESOPHAGEAL BANDING N/A 03/18/2017   Procedure: ESOPHAGEAL BANDING;  Surgeon: Corbin Ade, MD;  [facility];  Service: Endoscopy;  Laterality: N/A;   ESOPHAGEAL BANDING   12/17/2017   Procedure: ESOPHAGEAL BANDING;  Surgeon: Corbin Ade, MD;  [facility];  Service: Endoscopy;;   ESOPHAGEAL BANDING N/A 07/03/2020   Procedure: ESOPHAGEAL BANDING;  Surgeon: Corbin Ade, MD;  [facility];  Service: Endoscopy;  Laterality: N/A;   ESOPHAGEAL BANDING  04/01/2022   Procedure: ESOPHAGEAL BANDING;  Surgeon: Corbin Ade, MD;  [facility];  Service: Endoscopy;;   ESOPHAGOGASTRODUODENOSCOPY  08/2006   barretts esophagus, no dyplasia   ESOPHAGOGASTRODUODENOSCOPY  03/2006   barretts esophagus,bx focal atypia c/w low grade dysplasia, SB bx negative for celiac   ESOPHAGOGASTRODUODENOSCOPY  09/23/09   3 columns of grade 2 esophageal varices/hiatal hernia/3-4 cm segment Barrett's esophagus without dysplasia. Next EGD 09/2012  ESOPHAGOGASTRODUODENOSCOPY N/A 11/11/2012   Dr. Jena Gauss- Barrett;s esophagus, esophageal varices, portal gastopathy. hiatal hernia   ESOPHAGOGASTRODUODENOSCOPY N/A 04/30/2016   Procedure: ESOPHAGOGASTRODUODENOSCOPY (EGD);  Surgeon: Corbin Ade, MD;  [facility];  Service: Endoscopy;  Laterality: N/A;  815   ESOPHAGOGASTRODUODENOSCOPY N/A 12/20/2016   Procedure: ESOPHAGOGASTRODUODENOSCOPY (EGD);  Surgeon: West Bali, MD;  [facility];  Service: Endoscopy;  Laterality: N/A;   ESOPHAGOGASTRODUODENOSCOPY N/A 01/07/2017   Procedure: ESOPHAGOGASTRODUODENOSCOPY (EGD);  Surgeon: Corbin Ade, MD;  [facility];  Service: Endoscopy;  Laterality: N/A;   ESOPHAGOGASTRODUODENOSCOPY N/A 03/18/2017   Procedure: ESOPHAGOGASTRODUODENOSCOPY (EGD);  Surgeon: Corbin Ade, MD;  [facility];  Service: Endoscopy;  Laterality: N/A;  1030    ESOPHAGOGASTRODUODENOSCOPY N/A 12/17/2017   Procedure: ESOPHAGOGASTRODUODENOSCOPY (EGD);  Surgeon: Corbin Ade, MD;  [facility];  Service: Endoscopy;  Laterality: N/A;  10:30am   ESOPHAGOGASTRODUODENOSCOPY N/A 02/24/2019   Rourk: No  esophageal varices seen, scars noted from prior banding.  5 cm segment of salmon-colored epithelium coming up from the GE junction consistent with Barrett's (bx with no dysplasia).  Portal gastropathy.  Small hiatal hernia.  Recommended repeat EGD in 18 months.   ESOPHAGOGASTRODUODENOSCOPY N/A 07/03/2020   Procedure: ESOPHAGOGASTRODUODENOSCOPY (EGD);  Surgeon: Corbin Ade, MD;  [facility];  Service: Endoscopy;  Laterality: N/A;  10:15am   ESOPHAGOGASTRODUODENOSCOPY (EGD) WITH PROPOFOL N/A 04/01/2022   Procedure: ESOPHAGOGASTRODUODENOSCOPY (EGD) WITH PROPOFOL;  Surgeon: Corbin Ade, MD;  [facility];  Service: Endoscopy;  Laterality: N/A;   HEMORRHOID SURGERY  2008   POLYPECTOMY  04/01/2022   Procedure: POLYPECTOMY;  Surgeon: Corbin Ade, MD;  [facility];  Service: Endoscopy;;  ascending and descending   small bowel capsule endoscopy  10/2009   normal    Family History  Problem Relation Age of Onset   Alzheimer's disease Mother    CAD Father    Diabetes Mellitus II Father    Alzheimer's disease Father    Diabetes Mellitus II Sister    Colon cancer Sister 45       small, surgical excision; chemo/rad not needed   Cancer - Other Brother    Diabetes Mellitus II Brother    Hypertension Brother    Liver disease Neg Hx    Breast cancer Neg Hx     Allergies as of 09/06/2023 - Review Complete 09/06/2023  Allergen Reaction Noted   Codeine Nausea And Vomiting 04/19/2012    Social History   Socioeconomic History   Marital status: Married    Spouse name: Not on file   Number of children: Not on file   Years of education: Not on file   Highest education level: Not on file  Occupational History   Not on file  Tobacco Use   Smoking status: Never   Smokeless tobacco: Never  Vaping Use   Vaping status: Never Used  Substance and Sexual Activity   Alcohol use: No   Drug use: No   Sexual activity: Yes  Other Topics Concern   Not on file   Social History Narrative   Not on file   Social Drivers of Health   Financial Resource Strain: Low Risk  (04/23/2023)   Overall Financial Resource Strain (CARDIA)    Difficulty of Paying Living Expenses: Not hard at all  Food Insecurity: No Food Insecurity (04/16/2022)   Hunger Vital Sign    Worried About Running Out of Food in  the Last Year: Never true    Ran Out of Food in the Last Year: Never true  Transportation Needs: No Transportation Needs (04/23/2023)   PRAPARE - Administrator, Civil Service (Medical): No    Lack of Transportation (Non-Medical): No  Physical Activity: Inactive (04/09/2023)   Exercise Vital Sign    Days of Exercise per Week: 0 days    Minutes of Exercise per Session: 0 min  Stress: Not on file  Social Connections: Not on file     Review of Systems   Gen: Denies fever, chills, anorexia. Denies fatigue, weakness, weight loss.  CV: Denies chest pain, palpitations, syncope, peripheral edema, and claudication. Resp: Denies dyspnea at rest, cough, wheezing, coughing up blood, and pleurisy. GI: See HPI Derm: Denies rash, itching, dry skin Psych: Denies depression, anxiety, memory loss, confusion. No homicidal or suicidal ideation.  Heme: Denies bruising, bleeding, and enlarged lymph nodes.  Physical Exam   There were no vitals taken for this visit.  General:   Alert and oriented. No distress noted. Pleasant and cooperative.  Head:  Normocephalic and atraumatic. Eyes:  Conjuctiva clear without scleral icterus. Mouth:  Oral mucosa pink and moist. Good dentition. No lesions. Lungs:  Clear to auscultation bilaterally. No wheezes, rales, or rhonchi. No distress.  Heart:  S1, S2 present without murmurs appreciated.  Abdomen:  +BS, soft, non-tender and non-distended. No rebound or guarding. No HSM or masses noted. Rectal: *** Msk:  Symmetrical without gross deformities. Normal posture. Extremities:  Without edema. Neurologic:  Alert and  oriented  x4 Psych:  Alert and cooperative. Normal mood and affect.  Assessment  Alisha [name] is a 72 y.o. female with a history of *** presenting today with   MASH with HCC:  GERD, Barrett's esophagus:   PLAN   ***     Brooke Bonito, MSN, FNP-BC, AGACNP-BC [facility]

## 2023-09-06 NOTE — Patient Instructions (Signed)

## 2023-09-06 NOTE — Progress Notes (Signed)
09/06/2023, 1:06 PM  Endocrinology follow-up note   Subjective:    Patient ID: Alisha [name], female    DOB: 04-18-1951.  Alisha [name] is being seen in follow-up after she was seen in consultation for management of currently uncontrolled symptomatic diabetes requested by  Ignatius Specking, MD.   Past Medical History:  Diagnosis Date   B12 deficiency 11/03/2016   B12 deficiency 11/03/2016   Barrett's esophagus    Cirrhosis of liver without mention of alcohol    hep B surface antigen and HCV ab negative.pt has not had hepatitis A and B vaccines.U/S on 07/04/13 shows cirrhosis.   Diverticula of colon    pancolonic   Esophageal reflux    Esophageal varices (HCC)    Hiatal hernia    Iron deficiency anemia, unspecified    Other and unspecified hyperlipidemia    Portal hypertensive gastropathy (HCC)    Tubular adenoma    Type II or unspecified type diabetes mellitus without mention of complication, not stated as uncontrolled    Unspecified essential hypertension    Unspecified hemorrhoids without mention of complication     Past Surgical History:  Procedure Laterality Date   ABDOMINAL HYSTERECTOMY     BIOPSY  04/30/2016   Procedure: BIOPSY;  Surgeon: Corbin Ade, MD;  [facility];  Service: Endoscopy;;  esophagus   BIOPSY  02/24/2019   Procedure: BIOPSY;  Surgeon: Corbin Ade, MD;  [facility];  Service: Endoscopy;;   CESAREAN SECTION     x 2   COLONOSCOPY  03/2006   left sided diverticula, splenic flexure tublar adenoma   COLONOSCOPY  07/2002   villous tubular adenoma in rectum   COLONOSCOPY  09/23/09   external hemorrhoids/scattered pan colonic diverticula otherwise normal. cecal lipoma bx negative, normal TI. Next TCS 09/2014   COLONOSCOPY N/A 11/12/2014   Procedure: COLONOSCOPY;  Surgeon: Corbin Ade, MD;  [facility];  Service: Endoscopy;   Laterality: N/A;  1215 - moved to 12:30 - Ginger to notify pt   COLONOSCOPY N/A 01/07/2017   Rourk: Grade 4 internal hemorrhoids, 5 mm ulcer in the sigmoid colon (benign bx), 7 mm polyp removed from the cecum (tubular adenoma), diverticulosis.next tcs in five years   COLONOSCOPY WITH PROPOFOL N/A 04/01/2022   Procedure: COLONOSCOPY WITH PROPOFOL;  Surgeon: Corbin Ade, MD;  [facility];  Service: Endoscopy;  Laterality: N/A;  8:00am   ESOPHAGEAL BANDING N/A 12/20/2016   Procedure: ESOPHAGEAL BANDING;  Surgeon: West Bali, MD;  [facility];  Service: Endoscopy;  Laterality: N/A;   ESOPHAGEAL BANDING N/A 03/18/2017   Procedure: ESOPHAGEAL BANDING;  Surgeon: Corbin Ade, MD;  [facility];  Service: Endoscopy;  Laterality: N/A;   ESOPHAGEAL BANDING  12/17/2017   Procedure: ESOPHAGEAL BANDING;  Surgeon: Corbin Ade, MD;  [facility];  Service: Endoscopy;;   ESOPHAGEAL BANDING N/A 07/03/2020   Procedure: ESOPHAGEAL BANDING;  Surgeon: Corbin Ade, MD;  [facility];  Service: Endoscopy;  Laterality: N/A;   ESOPHAGEAL BANDING  04/01/2022   Procedure: ESOPHAGEAL BANDING;  Surgeon:  Corbin Ade, MD;  [facility];  Service: Endoscopy;;   ESOPHAGOGASTRODUODENOSCOPY  08/2006   barretts esophagus, no dyplasia   ESOPHAGOGASTRODUODENOSCOPY  03/2006   barretts esophagus,bx focal atypia c/w low grade dysplasia, SB bx negative for celiac   ESOPHAGOGASTRODUODENOSCOPY  09/23/09   3 columns of grade 2 esophageal varices/hiatal hernia/3-4 cm segment Barrett's esophagus without dysplasia. Next EGD 09/2012   ESOPHAGOGASTRODUODENOSCOPY N/A 11/11/2012   Dr. Jena Gauss- Barrett;s esophagus, esophageal varices, portal gastopathy. hiatal hernia   ESOPHAGOGASTRODUODENOSCOPY N/A 04/30/2016   Procedure: ESOPHAGOGASTRODUODENOSCOPY (EGD);  Surgeon: Corbin Ade, MD;  [facility];  Service: Endoscopy;  Laterality: N/A;  815    ESOPHAGOGASTRODUODENOSCOPY N/A 12/20/2016   Procedure: ESOPHAGOGASTRODUODENOSCOPY (EGD);  Surgeon: West Bali, MD;  [facility];  Service: Endoscopy;  Laterality: N/A;   ESOPHAGOGASTRODUODENOSCOPY N/A 01/07/2017   Procedure: ESOPHAGOGASTRODUODENOSCOPY (EGD);  Surgeon: Corbin Ade, MD;  [facility];  Service: Endoscopy;  Laterality: N/A;   ESOPHAGOGASTRODUODENOSCOPY N/A 03/18/2017   Procedure: ESOPHAGOGASTRODUODENOSCOPY (EGD);  Surgeon: Corbin Ade, MD;  [facility];  Service: Endoscopy;  Laterality: N/A;  1030    ESOPHAGOGASTRODUODENOSCOPY N/A 12/17/2017   Procedure: ESOPHAGOGASTRODUODENOSCOPY (EGD);  Surgeon: Corbin Ade, MD;  [facility];  Service: Endoscopy;  Laterality: N/A;  10:30am   ESOPHAGOGASTRODUODENOSCOPY N/A 02/24/2019   Rourk: No esophageal varices seen, scars noted from prior banding.  5 cm segment of salmon-colored epithelium coming up from the GE junction consistent with Barrett's (bx with no dysplasia).  Portal gastropathy.  Small hiatal hernia.  Recommended repeat EGD in 18 months.   ESOPHAGOGASTRODUODENOSCOPY N/A 07/03/2020   Procedure: ESOPHAGOGASTRODUODENOSCOPY (EGD);  Surgeon: Corbin Ade, MD;  [facility];  Service: Endoscopy;  Laterality: N/A;  10:15am   ESOPHAGOGASTRODUODENOSCOPY (EGD) WITH PROPOFOL N/A 04/01/2022   Procedure: ESOPHAGOGASTRODUODENOSCOPY (EGD) WITH PROPOFOL;  Surgeon: Corbin Ade, MD;  [facility];  Service: Endoscopy;  Laterality: N/A;   HEMORRHOID SURGERY  2008   POLYPECTOMY  04/01/2022   Procedure: POLYPECTOMY;  Surgeon: Corbin Ade, MD;  [facility];  Service: Endoscopy;;  ascending and descending   small bowel capsule endoscopy  10/2009   normal    Social History   Socioeconomic History   Marital status: Married    Spouse name: Not on file   Number of children: Not on file   Years of education: Not on file   Highest education level: Not on  file  Occupational History   Not on file  Tobacco Use   Smoking status: Never   Smokeless tobacco: Never  Vaping Use   Vaping status: Never Used  Substance and Sexual Activity   Alcohol use: No   Drug use: No   Sexual activity: Yes  Other Topics Concern   Not on file  Social History Narrative   Not on file   Social Drivers of Health   Financial Resource Strain: Low Risk  (04/23/2023)   Overall Financial Resource Strain (CARDIA)    Difficulty of Paying Living Expenses: Not hard at all  Food Insecurity: No Food Insecurity (04/16/2022)   Hunger Vital Sign    Worried About Running Out of Food in the Last Year: Never true    Ran Out of Food in the Last Year: Never true  Transportation Needs: No Transportation Needs (04/23/2023)   PRAPARE - Administrator, Civil Service (Medical): No    Lack of Transportation (Non-Medical): No  Physical Activity: Inactive (04/09/2023)   Exercise Vital Sign    Days of Exercise per Week: 0 days    Minutes of Exercise per Session: 0 min  Stress: Not on file  Social Connections: Not on file    Family History  Problem Relation Age of Onset   Alzheimer's disease Mother    CAD Father    Diabetes Mellitus II Father    Alzheimer's disease Father    Diabetes Mellitus II Sister    Colon cancer Sister 47       small, surgical excision; chemo/rad not needed   Cancer - Other Brother    Diabetes Mellitus II Brother    Hypertension Brother    Liver disease Neg Hx    Breast cancer Neg Hx     Outpatient Encounter Medications as of 09/06/2023  Medication Sig   Accu-Chek Softclix Lancets lancets USE TO CHECK BLOOD SUGAR FOUR TIMES DAILY. Dx: E11.65   Cholecalciferol (VITAMIN D) 2000 units tablet Take 2,000 Units by mouth daily.   Continuous Glucose Receiver (FREESTYLE LIBRE 3 READER) DEVI USE AS DIRECTED   Continuous Glucose Sensor (FREESTYLE LIBRE 3 SENSOR) MISC CHANGE SENSOR EVERY 14 DAYS AS DIRECTED   insulin degludec (TRESIBA FLEXTOUCH) 200  UNIT/ML FlexTouch Pen Inject 10 Units into the skin at bedtime.   lisinopril (PRINIVIL,ZESTRIL) 10 MG tablet Take 10 mg by mouth daily.   NOVOFINE 32G X 6 MM MISC USE ONE AS DIRECTED TWICE DAILY.   pantoprazole (PROTONIX) 40 MG tablet Take 1 tablet (40 mg total) by mouth 2 (two) times daily before a meal.   propranolol (INDERAL) 10 MG tablet TAKE 3 TABLETS BY MOUTH AM AND TAKE 3 TABLETS BY MOUTH IN THE EVENING   Semaglutide, 2 MG/DOSE, (OZEMPIC, 2 MG/DOSE,) 8 MG/3ML SOPN INJECT 2MG  UNDER THE SKIN EVERY WEEK   [DISCONTINUED] TRESIBA FLEXTOUCH 200 UNIT/ML FlexTouch Pen INJECT 20 UNITS UNDER THE SKIN AT BEDTIME   No facility-administered encounter medications on file as of 09/06/2023.    ALLERGIES: Allergies  Allergen Reactions   Codeine Nausea And Vomiting    VACCINATION STATUS: Immunization History  Administered Date(s) Administered   Hepatitis B, ADULT 07/21/2017, 12/14/2017, 08/16/2018   Influenza Split 07/07/2007   Influenza, High Dose Seasonal PF 07/04/2017, 06/19/2018, 05/23/2019, 05/23/2019, 07/29/2022   Influenza-Unspecified 07/04/2017   Moderna Sars-Covid-2 Vaccination 11/14/2019, 12/12/2019, 07/17/2020   Pneumococcal Conjugate-13 03/21/2019   Pneumococcal Polysaccharide-23 07/07/2007, 07/25/2019    Diabetes She presents for her follow-up diabetic visit. She has type 2 diabetes mellitus. Onset time: She was diagnosed at approximate age of 39 years. Her disease course has been improving. There are no hypoglycemic associated symptoms. Pertinent negatives for hypoglycemia include no confusion, headaches, pallor or seizures. Pertinent negatives for diabetes include no chest pain, no fatigue, no polydipsia, no polyphagia and no polyuria. (She did not bring any logs nor meter to review.  Patient verbally reports that she is having fluctuating glycemic profile including hypoglycemia.) There are no hypoglycemic complications. Symptoms are stable. Diabetic complications include  nephropathy. Risk factors for coronary artery disease include diabetes mellitus, hypertension, obesity, post-menopausal and family history. Her weight is decreasing steadily. She is following a generally unhealthy diet. When asked about meal planning, she reported none. She has had a previous visit with a dietitian. She participates in exercise intermittently. Her home blood glucose trend is decreasing steadily. Her breakfast blood glucose range is generally 130-140 mg/dl. Her lunch blood glucose range is generally 130-140 mg/dl. Her dinner blood glucose range is generally  130-140 mg/dl. Her bedtime blood glucose range is generally 130-140 mg/dl. Her overall blood glucose range is 130-140 mg/dl. Alisha [name] presents with continued improvement in her glycemic profile.  Her AGP report shows average blood glucose of 136 mg per DL.  She has 91% time in range, 8% level 1 hyperglycemia.  She has 1% Level One hypoglycemia.  Her point-of-care A1c is 5.5%, overall improving from double-digit A1c.    ) An ACE inhibitor/angiotensin II receptor blocker is being taken. Eye exam is current.  Hypertension This is a chronic problem. The current episode started more than 1 year ago. The problem is controlled. Pertinent negatives include no chest pain, headaches, palpitations or shortness of breath. Risk factors for coronary artery disease include diabetes mellitus, family history, obesity, dyslipidemia, sedentary lifestyle and post-menopausal state. Past treatments include ACE inhibitors. Hypertensive end-organ damage includes kidney disease. Identifiable causes of hypertension include chronic renal disease.     Review of Systems  Constitutional:  Negative for chills, fatigue, fever and unexpected weight change.  HENT:  Negative for trouble swallowing and voice change.   Eyes:  Negative for visual disturbance.  Respiratory:  Negative for cough, shortness of breath and wheezing.   Cardiovascular:  Negative for chest pain,  palpitations and leg swelling.  Gastrointestinal:  Negative for diarrhea, nausea and vomiting.  Endocrine: Negative for cold intolerance, heat intolerance, polydipsia, polyphagia and polyuria.  Musculoskeletal:  Negative for arthralgias and myalgias.  Skin:  Negative for color change, pallor, rash and wound.  Neurological:  Negative for seizures and headaches.  Psychiatric/Behavioral:  Negative for confusion and suicidal ideas.     Objective:       09/06/2023   11:08 AM 05/04/2023   11:13 AM 03/09/2023    2:23 PM  Vitals with BMI  Height 5\' 0"  5\' 0"  5\' 0"   Weight 164 lbs 3 oz 165 lbs 3 oz 165 lbs 5 oz  BMI 32.07 32.26 32.28  Systolic 122 100 010  Diastolic 64 50 48  Pulse 68 92 73    BP 122/64   Pulse 68   Ht 5' (1.524 m)   Wt 164 lb 3.2 oz (74.5 kg)   BMI 32.07 kg/m   Wt Readings from Last 3 Encounters:  09/06/23 164 lb 3.2 oz (74.5 kg)  05/04/23 165 lb 3.2 oz (74.9 kg)  03/09/23 165 lb 4.8 oz (75 kg)      CMP ( most recent) CMP     Component Value Date/Time   NA 133 (L) 05/28/2023 1255   K 4.5 05/28/2023 1255   CL 106 05/28/2023 1255   CO2 23 05/28/2023 1255   GLUCOSE 171 (H) 05/28/2023 1255   BUN 21 05/28/2023 1255   BUN 15 01/25/2020 0000   CREATININE 1.77 (H) 05/28/2023 1255   CREATININE 1.62 (H) 03/28/2018 1146   CALCIUM 9.0 05/28/2023 1255   CALCIUM 8.8 10/16/2021 1354   PROT 6.4 (L) 05/28/2023 1255   ALBUMIN 3.2 (L) 05/28/2023 1255   AST 33 05/28/2023 1255   ALT 17 05/28/2023 1255   ALKPHOS 71 05/28/2023 1255   BILITOT 3.9 (H) 05/28/2023 1255   GFRNONAA 30 (L) 05/28/2023 1255   GFRAA 36 (L) 05/29/2020 1416     Diabetic Labs (most recent): Lab Results  Component Value Date   HGBA1C 5.5 09/06/2023   HGBA1C 5.7 05/04/2023   HGBA1C 5.0 11/03/2022   MICROALBUR 30 03/24/2022   MICROALBUR 25.8 (H) 12/23/2021   MICROALBUR 9.6 (H) 06/20/2021     Lipid Panel (  most recent) Lipid Panel     Component Value Date/Time   CHOL 157 10/27/2022 1152    TRIG 98 10/27/2022 1152   HDL 43 10/27/2022 1152   CHOLHDL 3.7 10/27/2022 1152   VLDL 20 10/27/2022 1152   LDLCALC 94 10/27/2022 1152      Lab Results  Component Value Date   TSH 2.448 10/27/2022   TSH 2.577 11/24/2021   TSH 2.531 12/20/2020   TSH 2.46 01/25/2020   FREET4 1.12 10/27/2022   FREET4 0.92 11/24/2021   FREET4 0.97 12/20/2020      Assessment & Plan:   1. Uncontrolled type 2 diabetes mellitus with stage 3 chronic kidney disease (HCC)  - Alisha [name] has currently uncontrolled symptomatic type 2 DM since 72 years of age.  Alisha [name] presents with continued improvement in her glycemic profile.  Her AGP report shows average blood glucose of 136 mg per DL.  She has 91% time in range, 8% level 1 hyperglycemia.  She has 1% Level One hypoglycemia.  Her point-of-care A1c is 5.5%, overall improving from double-digit A1c.    - I had a long discussion with her about the progressive nature of diabetes and the pathology behind its complications. -her diabetes is complicated by CKD and she remains at a high risk for more acute and chronic complications which include CAD, CVA, CKD, retinopathy, and neuropathy. These are all discussed in detail with her.  - I have counseled her on diet  and weight management  by adopting a carbohydrate restricted/protein rich diet. Patient is encouraged to switch to  unprocessed or minimally processed     complex starch and increased protein intake (animal or plant source), fruits, and vegetables. -  she is advised to stick to a routine mealtimes to eat 3 meals  a day and avoid unnecessary snacks ( to snack only to correct hypoglycemia).   Evidently, she is not utilizing her prescribed medication optimally.  She is scans  1-2 on average daily which will not allow her to utilize intensive treatment with basal/bolus insulin.   - she acknowledges that there is a room for improvement in her food and drink choices. - Suggestion is made for her to  avoid simple carbohydrates  from her diet including Cakes, Sweet Desserts, Ice Cream, Soda (diet and regular), Sweet Tea, Candies, Chips, Cookies, Store Bought Juices, Alcohol , Artificial Sweeteners,  Coffee Creamer, and "Sugar-free" Products, Lemonade. This will help patient to have more stable blood glucose profile and potentially avoid unintended weight gain.  The following Lifestyle Medicine recommendations according to American College of Lifestyle Medicine  [facility]) were discussed and and offered to patient and she  agrees to start the journey:  A. Whole Foods, Plant-Based Nutrition comprising of fruits and vegetables, plant-based proteins, whole-grain carbohydrates was discussed in detail with the patient.   A list for source of those nutrients were also provided to the patient.  Patient will use only water or unsweetened tea for hydration. B.  The need to stay away from risky substances including alcohol, smoking; obtaining 7 to 9 hours of restorative sleep, at least 150 minutes of moderate intensity exercise weekly, the importance of healthy social connections,  and stress management techniques were discussed. C.  A full color page of  Calorie density of various food groups per pound showing examples of each food groups was provided to the patient.   - I have approached her with the following individualized plan to manage  her diabetes and patient  agrees:    -She presents with continued engagement and improvement in her glycemic profile.  She will not need prandial insulin at this point.  She is advised to lower her Tresiba to 10 units nightly.  She is advised to continue Ozempic 2 mg subcutaneously weekly.  She continues to tolerate this medication.  Side effects and precautions discussed with her.   - she is not a candidate for Metformin, due to concurrent renal insufficiency. -She may requalify for low-dose SGLT2 inhibitor if needed on subsequent visits.  - Specific targets for  A1c;   LDL, HDL,  and Triglycerides were discussed with the patient.  2) Blood Pressure /Hypertension:  Her blood pressure is controlled to target. she is advised to continue her current medications including lisinopril 10 mg p.o. daily with breakfast .   3) Lipids/Hyperlipidemia:   Review of her recent lipid panel showed uncontrolled LDL at 94.  She reports statin intolerance and not on statins.    She reports statin intolerance.  Whole food plant-based diet discussed and recommended above will help with dyslipidemia as well.  We considered for fasting lipid panel before her next visit.   4)  Weight/Diet:  Body mass index is 32.07 kg/m.  -Presents kept off the  15 pounds weight loss,    she is  a candidate for weight loss. I discussed with her the fact that loss of 5 - 10% of her  current body weight will have the most impact on her diabetes management.  Exercise, and detailed carbohydrates information provided  -  detailed on discharge instructions.  5) Chronic Care/Health Maintenance:  -she  is on ACEI Statin medications and  is encouraged to initiate and continue to follow up with Ophthalmology, Dentist,  Podiatrist at least yearly or according to recommendations, and advised to   stay away from smoking. I have recommended yearly flu vaccine and pneumonia vaccine at least every 5 years; moderate intensity exercise for up to 150 minutes weekly; and  sleep for at least 7 hours a day.  6.  Vitamin D deficiency: She is advised to continue cholecalciferol 2000 units daily.   She recently had normal ABI, will be repeated in 5 years.  - she is  advised to maintain close follow up with Ignatius Specking, MD for primary care needs, as well as her other providers for optimal and coordinated care.   I spent  26  minutes in the care of the patient today including review of labs from CMP, Lipids, Thyroid Function, Hematology (current and previous including abstractions from other facilities); face-to-face time  discussing  her blood glucose readings/logs, discussing hypoglycemia and hyperglycemia episodes and symptoms, medications doses, her options of short and long term treatment based on the latest standards of care / guidelines;  discussion about incorporating lifestyle medicine;  and documenting the encounter. Risk reduction counseling performed per USPSTF guidelines to reduce  obesity and cardiovascular risk factors.     Please refer to Patient Instructions for Blood Glucose Monitoring and Insulin/Medications Dosing Guide"  in media tab for additional information. Please  also refer to " Patient Self Inventory" in the Media  tab for reviewed elements of pertinent patient history.  Alisha [name] participated in the discussions, expressed understanding, and voiced agreement with the above plans.  All questions were answered to her satisfaction. she is encouraged to contact clinic should she have any questions or concerns prior to her return visit.    Follow up plan: - Return in  about 4 months (around 01/05/2024) for Bring Meter/CGM Device/Logs- A1c in Office.  Marquis Lunch, MD [facility] [address] [place], [place] [address] Phone: [phone]  Fax: [phone]    09/06/2023, 1:06 PM  This note was partially dictated with voice recognition software. Similar sounding words can be transcribed inadequately or may not  be corrected upon review.

## 2023-09-09 ENCOUNTER — Encounter: Payer: Self-pay | Admitting: Gastroenterology

## 2023-09-27 ENCOUNTER — Encounter (INDEPENDENT_AMBULATORY_CARE_PROVIDER_SITE_OTHER): Payer: Medicare PPO | Admitting: Ophthalmology

## 2023-09-27 DIAGNOSIS — H3581 Retinal edema: Secondary | ICD-10-CM

## 2023-10-05 ENCOUNTER — Encounter (INDEPENDENT_AMBULATORY_CARE_PROVIDER_SITE_OTHER): Payer: Medicare PPO | Admitting: Ophthalmology

## 2023-10-05 DIAGNOSIS — H3581 Retinal edema: Secondary | ICD-10-CM

## 2023-10-07 DIAGNOSIS — Z8505 Personal history of malignant neoplasm of liver: Secondary | ICD-10-CM | POA: Diagnosis not present

## 2023-10-07 DIAGNOSIS — K7689 Other specified diseases of liver: Secondary | ICD-10-CM | POA: Diagnosis not present

## 2023-10-15 DIAGNOSIS — N189 Chronic kidney disease, unspecified: Secondary | ICD-10-CM | POA: Diagnosis not present

## 2023-10-15 DIAGNOSIS — D631 Anemia in chronic kidney disease: Secondary | ICD-10-CM | POA: Diagnosis not present

## 2023-10-15 DIAGNOSIS — R809 Proteinuria, unspecified: Secondary | ICD-10-CM | POA: Diagnosis not present

## 2023-10-18 DIAGNOSIS — D638 Anemia in other chronic diseases classified elsewhere: Secondary | ICD-10-CM | POA: Diagnosis not present

## 2023-10-18 DIAGNOSIS — N1832 Chronic kidney disease, stage 3b: Secondary | ICD-10-CM | POA: Diagnosis not present

## 2023-10-18 DIAGNOSIS — R918 Other nonspecific abnormal finding of lung field: Secondary | ICD-10-CM | POA: Diagnosis not present

## 2023-10-18 DIAGNOSIS — K7460 Unspecified cirrhosis of liver: Secondary | ICD-10-CM | POA: Diagnosis not present

## 2023-10-19 NOTE — Progress Notes (Shared)
[facility] - Clinic Note  10/25/2023   CHIEF COMPLAINT Patient presents for No chief complaint on file.  HISTORY OF PRESENT ILLNESS: Alisha [name] is a 73 y.o. female who presents to the clinic today for:   Referring physician: Ignatius Specking, MD [address] [place],  [place] [address]  HISTORICAL INFORMATION:  Selected notes from the MEDICAL RECORD NUMBER Referred by Dr. Earlene Plater for concern of diabetic retinopathy LEE:  Ocular Hx- PMH-   CURRENT MEDICATIONS: No current outpatient medications on file. (Ophthalmic Drugs)   No current facility-administered medications for this visit. (Ophthalmic Drugs)   Current Outpatient Medications (Other)  Medication Sig   Accu-Chek Softclix Lancets lancets USE TO CHECK BLOOD SUGAR FOUR TIMES DAILY. Dx: E11.65   Cholecalciferol (VITAMIN D) 2000 units tablet Take 2,000 Units by mouth daily.   Continuous Glucose Receiver (FREESTYLE LIBRE 3 READER) DEVI USE AS DIRECTED   Continuous Glucose Sensor (FREESTYLE LIBRE 3 SENSOR) MISC CHANGE SENSOR EVERY 14 DAYS AS DIRECTED   insulin degludec (TRESIBA FLEXTOUCH) 200 UNIT/ML FlexTouch Pen Inject 10 Units into the skin at bedtime.   lisinopril (PRINIVIL,ZESTRIL) 10 MG tablet Take 10 mg by mouth daily.   NOVOFINE 32G X 6 MM MISC USE ONE AS DIRECTED TWICE DAILY.   pantoprazole (PROTONIX) 40 MG tablet Take 1 tablet (40 mg total) by mouth 2 (two) times daily before a meal.   propranolol (INDERAL) 10 MG tablet TAKE 3 TABLETS BY MOUTH AM AND TAKE 3 TABLETS BY MOUTH IN THE EVENING   Semaglutide, 2 MG/DOSE, (OZEMPIC, 2 MG/DOSE,) 8 MG/3ML SOPN INJECT 2MG  UNDER THE SKIN EVERY WEEK   No current facility-administered medications for this visit. (Other)   REVIEW OF SYSTEMS:  ALLERGIES Allergies  Allergen Reactions   Codeine Nausea And Vomiting   PAST MEDICAL HISTORY Past Medical History:  Diagnosis Date   B12 deficiency 11/03/2016   B12 deficiency 11/03/2016   Barrett's esophagus     Cirrhosis of liver without mention of alcohol    hep B surface antigen and HCV ab negative.pt has not had hepatitis A and B vaccines.U/S on 07/04/13 shows cirrhosis.   Diverticula of colon    pancolonic   Esophageal reflux    Esophageal varices (HCC)    Hiatal hernia    Iron deficiency anemia, unspecified    Other and unspecified hyperlipidemia    Portal hypertensive gastropathy (HCC)    Tubular adenoma    Type II or unspecified type diabetes mellitus without mention of complication, not stated as uncontrolled    Unspecified essential hypertension    Unspecified hemorrhoids without mention of complication    Past Surgical History:  Procedure Laterality Date   ABDOMINAL HYSTERECTOMY     BIOPSY  04/30/2016   Procedure: BIOPSY;  Surgeon: Corbin Ade, MD;  [facility];  Service: Endoscopy;;  esophagus   BIOPSY  02/24/2019   Procedure: BIOPSY;  Surgeon: Corbin Ade, MD;  [facility]: [facility];  Service: Endoscopy;;   CESAREAN SECTION     x 2   COLONOSCOPY  03/2006   left sided diverticula, splenic flexure tublar adenoma   COLONOSCOPY  07/2002   villous tubular adenoma in rectum   COLONOSCOPY  09/23/09   external hemorrhoids/scattered pan colonic diverticula otherwise normal. cecal lipoma bx negative, normal TI. Next TCS 09/2014   COLONOSCOPY N/A 11/12/2014   Procedure: COLONOSCOPY;  Surgeon: Corbin Ade, MD;  [facility]: [facility];  Service: Endoscopy;  Laterality: N/A;  1215 - moved to 12:30 - Ginger to notify pt   COLONOSCOPY N/A 01/07/2017   Rourk: Grade 4 internal hemorrhoids, 5 mm ulcer in the sigmoid colon (benign bx), 7 mm polyp removed from the cecum (tubular adenoma), diverticulosis.next tcs in five years   COLONOSCOPY WITH PROPOFOL N/A 04/01/2022   Procedure: COLONOSCOPY WITH PROPOFOL;  Surgeon: Corbin Ade, MD;  [facility]: [facility];  Service: Endoscopy;  Laterality: N/A;  8:00am   ESOPHAGEAL BANDING N/A 12/20/2016   Procedure: ESOPHAGEAL BANDING;   Surgeon: West Bali, MD;  [facility];  Service: Endoscopy;  Laterality: N/A;   ESOPHAGEAL BANDING N/A 03/18/2017   Procedure: ESOPHAGEAL BANDING;  Surgeon: Corbin Ade, MD;  [facility];  Service: Endoscopy;  Laterality: N/A;   ESOPHAGEAL BANDING  12/17/2017   Procedure: ESOPHAGEAL BANDING;  Surgeon: Corbin Ade, MD;  [facility];  Service: Endoscopy;;   ESOPHAGEAL BANDING N/A 07/03/2020   Procedure: ESOPHAGEAL BANDING;  Surgeon: Corbin Ade, MD;  [facility];  Service: Endoscopy;  Laterality: N/A;   ESOPHAGEAL BANDING  04/01/2022   Procedure: ESOPHAGEAL BANDING;  Surgeon: Corbin Ade, MD;  [facility];  Service: Endoscopy;;   ESOPHAGOGASTRODUODENOSCOPY  08/2006   barretts esophagus, no dyplasia   ESOPHAGOGASTRODUODENOSCOPY  03/2006   barretts esophagus,bx focal atypia c/w low grade dysplasia, SB bx negative for celiac   ESOPHAGOGASTRODUODENOSCOPY  09/23/09   3 columns of grade 2 esophageal varices/hiatal hernia/3-4 cm segment Barrett's esophagus without dysplasia. Next EGD 09/2012   ESOPHAGOGASTRODUODENOSCOPY N/A 11/11/2012   Dr. Jena Gauss- Barrett;s esophagus, esophageal varices, portal gastopathy. hiatal hernia   ESOPHAGOGASTRODUODENOSCOPY N/A 04/30/2016   Procedure: ESOPHAGOGASTRODUODENOSCOPY (EGD);  Surgeon: Corbin Ade, MD;  [facility];  Service: Endoscopy;  Laterality: N/A;  815   ESOPHAGOGASTRODUODENOSCOPY N/A 12/20/2016   Procedure: ESOPHAGOGASTRODUODENOSCOPY (EGD);  Surgeon: West Bali, MD;  [facility];  Service: Endoscopy;  Laterality: N/A;   ESOPHAGOGASTRODUODENOSCOPY N/A 01/07/2017   Procedure: ESOPHAGOGASTRODUODENOSCOPY (EGD);  Surgeon: Corbin Ade, MD;  [facility];  Service: Endoscopy;  Laterality: N/A;   ESOPHAGOGASTRODUODENOSCOPY N/A 03/18/2017   Procedure: ESOPHAGOGASTRODUODENOSCOPY (EGD);  Surgeon: Corbin Ade, MD;  [facility];  Service:  Endoscopy;  Laterality: N/A;  1030    ESOPHAGOGASTRODUODENOSCOPY N/A 12/17/2017   Procedure: ESOPHAGOGASTRODUODENOSCOPY (EGD);  Surgeon: Corbin Ade, MD;  [facility];  Service: Endoscopy;  Laterality: N/A;  10:30am   ESOPHAGOGASTRODUODENOSCOPY N/A 02/24/2019   Rourk: No esophageal varices seen, scars noted from prior banding.  5 cm segment of salmon-colored epithelium coming up from the GE junction consistent with Barrett's (bx with no dysplasia).  Portal gastropathy.  Small hiatal hernia.  Recommended repeat EGD in 18 months.   ESOPHAGOGASTRODUODENOSCOPY N/A 07/03/2020   Procedure: ESOPHAGOGASTRODUODENOSCOPY (EGD);  Surgeon: Corbin Ade, MD;  [facility];  Service: Endoscopy;  Laterality: N/A;  10:15am   ESOPHAGOGASTRODUODENOSCOPY (EGD) WITH PROPOFOL N/A 04/01/2022   Procedure: ESOPHAGOGASTRODUODENOSCOPY (EGD) WITH PROPOFOL;  Surgeon: Corbin Ade, MD;  [facility];  Service: Endoscopy;  Laterality: N/A;   HEMORRHOID SURGERY  2008   POLYPECTOMY  04/01/2022   Procedure: POLYPECTOMY;  Surgeon: Corbin Ade, MD;  [facility];  Service: Endoscopy;;  ascending and descending   small bowel capsule endoscopy  10/2009   normal   FAMILY HISTORY Family History  Problem Relation Age of Onset   Alzheimer's disease Mother  CAD Father    Diabetes Mellitus II Father    Alzheimer's disease Father    Diabetes Mellitus II Sister    Colon cancer Sister 4       small, surgical excision; chemo/rad not needed   Cancer - Other Brother    Diabetes Mellitus II Brother    Hypertension Brother    Liver disease Neg Hx    Breast cancer Neg Hx    SOCIAL HISTORY Social History   Tobacco Use   Smoking status: Never   Smokeless tobacco: Never  Vaping Use   Vaping status: Never Used  Substance Use Topics   Alcohol use: No   Drug use: No       OPHTHALMIC EXAM:  Not recorded    IMAGING AND PROCEDURES  Imaging and Procedures for  10/25/2023        ASSESSMENT/PLAN:   ICD-10-CM   1. Retinal edema  H35.81       1.  2.  3.  Ophthalmic Meds Ordered this visit:  No orders of the defined types were placed in this encounter.    No follow-ups on file.  There are no Patient Instructions on file for this visit.  Explained the diagnoses, plan, and follow up with the patient and they expressed understanding.  Patient expressed understanding of the importance of proper follow up care.  This document serves as a record of services personally performed by Karie Chimera, MD, PhD. It was created on their behalf by Glee Arvin. Manson Passey, OA an ophthalmic technician. The creation of this record is the provider's dictation and/or activities during the visit.    Electronically signed by: Glee Arvin. Manson Passey, OA 10/19/23 12:24 PM     Karie Chimera, M.D., Ph.D. Diseases & Surgery of the Retina and Vitreous [facility] 10/25/2023  Abbreviations: M myopia (nearsighted); A astigmatism; H hyperopia (farsighted); P presbyopia; Mrx spectacle prescription;  CTL contact lenses; OD right eye; OS left eye; OU both eyes  XT exotropia; ET esotropia; PEK punctate epithelial keratitis; PEE punctate epithelial erosions; DES dry eye syndrome; MGD meibomian gland dysfunction; ATs artificial tears; PFAT's preservative free artificial tears; NSC nuclear sclerotic cataract; PSC posterior subcapsular cataract; ERM epi-retinal membrane; PVD posterior vitreous detachment; RD retinal detachment; DM diabetes mellitus; DR diabetic retinopathy; NPDR non-proliferative diabetic retinopathy; PDR proliferative diabetic retinopathy; CSME clinically significant macular edema; DME diabetic macular edema; dbh dot blot hemorrhages; CWS cotton wool spot; POAG primary open angle glaucoma; C/D cup-to-disc ratio; HVF humphrey visual field; GVF goldmann visual field; OCT optical coherence tomography; IOP intraocular pressure; BRVO Branch retinal vein  occlusion; CRVO central retinal vein occlusion; CRAO central retinal artery occlusion; BRAO branch retinal artery occlusion; RT retinal tear; SB scleral buckle; PPV pars plana vitrectomy; VH Vitreous hemorrhage; PRP panretinal laser photocoagulation; IVK intravitreal kenalog; VMT vitreomacular traction; MH Macular hole;  NVD neovascularization of the disc; NVE neovascularization elsewhere; AREDS age related eye disease study; ARMD age related macular degeneration; POAG primary open angle glaucoma; EBMD epithelial/anterior basement membrane dystrophy; ACIOL anterior chamber intraocular lens; IOL intraocular lens; PCIOL posterior chamber intraocular lens; Phaco/IOL phacoemulsification with intraocular lens placement; PRK photorefractive keratectomy; LASIK laser assisted in situ keratomileusis; HTN hypertension; DM diabetes mellitus; COPD chronic obstructive pulmonary disease

## 2023-10-25 ENCOUNTER — Encounter (INDEPENDENT_AMBULATORY_CARE_PROVIDER_SITE_OTHER): Payer: Medicare PPO | Admitting: Ophthalmology

## 2023-10-25 DIAGNOSIS — H3581 Retinal edema: Secondary | ICD-10-CM

## 2023-11-03 ENCOUNTER — Other Ambulatory Visit: Payer: Self-pay | Admitting: "Endocrinology

## 2023-11-19 DIAGNOSIS — C220 Liver cell carcinoma: Secondary | ICD-10-CM | POA: Diagnosis not present

## 2023-11-19 DIAGNOSIS — K7460 Unspecified cirrhosis of liver: Secondary | ICD-10-CM | POA: Diagnosis not present

## 2023-11-19 DIAGNOSIS — K766 Portal hypertension: Secondary | ICD-10-CM | POA: Diagnosis not present

## 2023-11-19 DIAGNOSIS — K7689 Other specified diseases of liver: Secondary | ICD-10-CM | POA: Diagnosis not present

## 2023-11-19 DIAGNOSIS — R188 Other ascites: Secondary | ICD-10-CM | POA: Diagnosis not present

## 2023-12-01 ENCOUNTER — Other Ambulatory Visit: Payer: Self-pay

## 2023-12-01 DIAGNOSIS — E538 Deficiency of other specified B group vitamins: Secondary | ICD-10-CM

## 2023-12-01 DIAGNOSIS — D696 Thrombocytopenia, unspecified: Secondary | ICD-10-CM

## 2023-12-01 DIAGNOSIS — D500 Iron deficiency anemia secondary to blood loss (chronic): Secondary | ICD-10-CM

## 2023-12-02 ENCOUNTER — Inpatient Hospital Stay: Payer: Medicare PPO | Attending: Hematology

## 2023-12-02 DIAGNOSIS — D500 Iron deficiency anemia secondary to blood loss (chronic): Secondary | ICD-10-CM | POA: Insufficient documentation

## 2023-12-02 DIAGNOSIS — E538 Deficiency of other specified B group vitamins: Secondary | ICD-10-CM

## 2023-12-02 DIAGNOSIS — K922 Gastrointestinal hemorrhage, unspecified: Secondary | ICD-10-CM | POA: Insufficient documentation

## 2023-12-02 DIAGNOSIS — D696 Thrombocytopenia, unspecified: Secondary | ICD-10-CM | POA: Diagnosis not present

## 2023-12-02 DIAGNOSIS — K7460 Unspecified cirrhosis of liver: Secondary | ICD-10-CM | POA: Diagnosis not present

## 2023-12-02 LAB — CBC WITH DIFFERENTIAL/PLATELET
Abs Immature Granulocytes: 0.03 10*3/uL (ref 0.00–0.07)
Basophils Absolute: 0 10*3/uL (ref 0.0–0.1)
Basophils Relative: 0 %
Eosinophils Absolute: 0.2 10*3/uL (ref 0.0–0.5)
Eosinophils Relative: 3 %
HCT: 29.4 % — ABNORMAL LOW (ref 36.0–46.0)
Hemoglobin: 9.9 g/dL — ABNORMAL LOW (ref 12.0–15.0)
Immature Granulocytes: 1 %
Lymphocytes Relative: 16 %
Lymphs Abs: 1 10*3/uL (ref 0.7–4.0)
MCH: 31.5 pg (ref 26.0–34.0)
MCHC: 33.7 g/dL (ref 30.0–36.0)
MCV: 93.6 fL (ref 80.0–100.0)
Monocytes Absolute: 0.5 10*3/uL (ref 0.1–1.0)
Monocytes Relative: 8 %
Neutro Abs: 4.5 10*3/uL (ref 1.7–7.7)
Neutrophils Relative %: 72 %
Platelets: 76 10*3/uL — ABNORMAL LOW (ref 150–400)
RBC: 3.14 MIL/uL — ABNORMAL LOW (ref 3.87–5.11)
RDW: 15.3 % (ref 11.5–15.5)
WBC: 6.2 10*3/uL (ref 4.0–10.5)
nRBC: 0 % (ref 0.0–0.2)

## 2023-12-02 LAB — COMPREHENSIVE METABOLIC PANEL
ALT: 11 U/L (ref 0–44)
AST: 28 U/L (ref 15–41)
Albumin: 2.8 g/dL — ABNORMAL LOW (ref 3.5–5.0)
Alkaline Phosphatase: 60 U/L (ref 38–126)
Anion gap: 8 (ref 5–15)
BUN: 16 mg/dL (ref 8–23)
CO2: 22 mmol/L (ref 22–32)
Calcium: 8.7 mg/dL — ABNORMAL LOW (ref 8.9–10.3)
Chloride: 104 mmol/L (ref 98–111)
Creatinine, Ser: 1.7 mg/dL — ABNORMAL HIGH (ref 0.44–1.00)
GFR, Estimated: 31 mL/min — ABNORMAL LOW (ref >60)
Glucose, Bld: 161 mg/dL — ABNORMAL HIGH (ref 70–99)
Potassium: 4.2 mmol/L (ref 3.5–5.1)
Sodium: 134 mmol/L — ABNORMAL LOW (ref 135–145)
Total Bilirubin: 2.5 mg/dL — ABNORMAL HIGH (ref 0.0–1.2)
Total Protein: 6.1 g/dL — ABNORMAL LOW (ref 6.5–8.1)

## 2023-12-02 LAB — FERRITIN: Ferritin: 196 ng/mL (ref 11–307)

## 2023-12-02 LAB — IRON AND TIBC
Iron: 79 ug/dL (ref 28–170)
Saturation Ratios: 46 % — ABNORMAL HIGH (ref 10.4–31.8)
TIBC: 173 ug/dL — ABNORMAL LOW (ref 250–450)
UIBC: 94 ug/dL

## 2023-12-02 LAB — VITAMIN B12: Vitamin B-12: 646 pg/mL (ref 180–914)

## 2023-12-06 LAB — METHYLMALONIC ACID, SERUM: Methylmalonic Acid, Quantitative: 248 nmol/L (ref 0–378)

## 2023-12-08 DIAGNOSIS — K766 Portal hypertension: Secondary | ICD-10-CM | POA: Diagnosis not present

## 2023-12-08 DIAGNOSIS — Z9889 Other specified postprocedural states: Secondary | ICD-10-CM | POA: Diagnosis not present

## 2023-12-08 DIAGNOSIS — K7469 Other cirrhosis of liver: Secondary | ICD-10-CM | POA: Diagnosis not present

## 2023-12-08 DIAGNOSIS — Z8505 Personal history of malignant neoplasm of liver: Secondary | ICD-10-CM | POA: Diagnosis not present

## 2023-12-10 ENCOUNTER — Inpatient Hospital Stay: Payer: Medicare PPO | Admitting: Oncology

## 2023-12-10 DIAGNOSIS — K7460 Unspecified cirrhosis of liver: Secondary | ICD-10-CM

## 2023-12-10 DIAGNOSIS — E538 Deficiency of other specified B group vitamins: Secondary | ICD-10-CM | POA: Diagnosis not present

## 2023-12-10 DIAGNOSIS — D500 Iron deficiency anemia secondary to blood loss (chronic): Secondary | ICD-10-CM

## 2023-12-10 DIAGNOSIS — D696 Thrombocytopenia, unspecified: Secondary | ICD-10-CM | POA: Diagnosis not present

## 2023-12-10 NOTE — Progress Notes (Signed)
 Virtual Visit via Telephone Note  I connected with Alisha [name] on 12/10/23 at  2:00 PM EDT by telephone and verified that I am speaking with the correct person using two identifiers.  Location: Patient: Home Provider: Clinic    I discussed the limitations, risks, security and privacy concerns of performing an evaluation and management service by telephone and the availability of in person appointments. I also discussed with the patient that there may be a patient responsible charge related to this service. The patient expressed understanding and agreed to proceed.   History of Present Illness: Patient is a 73 year old who is followed by hematology for iron deficiency anemia secondary to intermittent GI bleeding and thrombocytopenia (cirrhosis/splenomegaly).  She was last evaluated virtually by me on 06/04/2023.  She is followed by [facility] transplant and was not placed on the wait list due to low MELD score and poor diabetes control.  She has had great improvement of her blood sugars however has found to have a lung nodule concerning for malignancy therefore is not a candidate for liver transplant.  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  She had regular MRIs performed.  AFP has continued to rise and has been concerning for advancing HCC.  Most recent MRI from 12/08/2023 showed cirrhotic liver morphology hypertension, posttreatment changes in liver without suspicious arterial enhancement and increased size of arterially hyperenhancing and diffusion restricting lesion.  Similar right middle lobe consolidation in the lungs.  She is followed by Dr. Wolfgang Phoenix and last seen on 10/18/2023.  Reports overall doing well.  No hospitalizations, surgeries or changes to her baseline health.  Reports she has very little appetite and no energy.  Denies any pain.  Has a chronic cough.  Will    Observations/Objective: Review of Systems  Constitutional:  Positive for malaise/fatigue.  Respiratory:  Positive for cough.     Physical Exam Neurological:     Mental Status: She is alert and oriented to person, place, and time.      Assessment and Plan: 1. B12 deficiency (Primary) -Labs from 12/02/2023 show vitamin B12 level 646 (513) within normal MMA. -She is currently not on B12 supplements and levels are maintaining.  We will continue to monitor.  2. Iron deficiency anemia due to chronic blood loss -Etiology felt to be secondary to history of GI bleeding related to portal gastropathy and gastroesophageal varices.  Has required multiple banding procedures.  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 -She is currently not on oral iron supplements. -Has required intermittent IV Feraheme last given in September 2021. -Most recent labs from 12/02/2023 show a ferritin of 196, iron saturations 46% and TIBC 173.  Hemoglobin is down and is 9.9 (11.2). -We discussed continuing to monitor.  She denies any bleeding.  Kidneys likely playing a part and might benefit from EPO treatment should iron levels remain stable.  If no improvement after next lab draw or worsening numbers, would recommend further evaluation with additional labs, GI consult or initiation of EPO.  3. Cirrhosis of liver without ascites, unspecified hepatic cirrhosis type (HCC) -She is followed closely by [facility] liver transplant team. -She is currently not on the transplant list due to comorbidities and concern for possible lung cancer.   4. Thrombocytopenia (HCC) -Platelets have been stable between high 40s to mid 70's since 2021.  -Platelets from 12/02/2023 are 76,000. -No  evidence of bleeding. -Continue active surveillance.  Follow Up Instructions: Return to clinic in 6 weeks with labs a few days before and telephone visit to review labs.    I discussed the assessment and treatment plan with the  patient. The patient was provided an opportunity to ask questions and all were answered. The patient agreed with the plan and demonstrated an understanding of the instructions.   The patient was advised to call back or seek an in-person evaluation if the symptoms worsen or if the condition fails to improve as anticipated.  I provided 20 minutes of non-face-to-face time during this encounter.   Mauro Kaufmann, NP

## 2023-12-27 ENCOUNTER — Ambulatory Visit: Admitting: Gastroenterology

## 2023-12-27 ENCOUNTER — Encounter: Payer: Self-pay | Admitting: Gastroenterology

## 2023-12-27 VITALS — BP 130/69 | HR 72 | Temp 97.7°F | Ht 60.0 in | Wt 167.2 lb

## 2023-12-27 DIAGNOSIS — R222 Localized swelling, mass and lump, trunk: Secondary | ICD-10-CM | POA: Diagnosis not present

## 2023-12-27 DIAGNOSIS — K7581 Nonalcoholic steatohepatitis (NASH): Secondary | ICD-10-CM | POA: Diagnosis not present

## 2023-12-27 DIAGNOSIS — K219 Gastro-esophageal reflux disease without esophagitis: Secondary | ICD-10-CM | POA: Diagnosis not present

## 2023-12-27 DIAGNOSIS — K2270 Barrett's esophagus without dysplasia: Secondary | ICD-10-CM | POA: Diagnosis not present

## 2023-12-27 DIAGNOSIS — C220 Liver cell carcinoma: Secondary | ICD-10-CM

## 2023-12-27 NOTE — Progress Notes (Signed)
 GI Office Note    Referring Provider: Ignatius Specking, MD Primary Care Physician:  Ignatius Specking, MD Primary Gastroenterologist: Gerrit Friends.Rourk, MD  Date:  12/27/2023  ID:  Alisha [name], DOB [birth year], MRN [id number]   Chief Complaint   Chief Complaint  Patient presents with   Follow-up   History of Present Illness  Alisha [name] is a 73 y.o. 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   EGD June 2020: -No active esophageal varices, scarring present -Barrett's esophagus s/p biopsy -Portal hypertensive gastropathy -Repeat EGD in 1.5 years   EGD October 2021: -No esophageal varices, scarring from prior banding -5 cm irregular tongues of Barrett's epithelium -Portal hypertensive gastropathy -Normal duodenum -Repeat EGD in 1.5 years for surveillance   Last office visit 12/17/2021.  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  Reflux well-controlled on pantoprazole 40 mg daily and occasionally occasionally needing this for breakthrough.  Denied any dysphagia.  Noted to not be a liver transplant candidate.  Liver disease well compensated.  Maintain on NSBB.  Advise repeat EGD for surveillance of varices.  Did not recommend any further upper GI biopsies given underlying portal hypertension.  No change in medications.  Will schedule for EGD.   EGD July 2023: -4 columns of grade 2 gastric varices s/p banding -1 column of underlying tongues of Barrett's esophagus recently -Portal hypertensive gastropathy -Small hiatal hernia   Colonoscopy July 2023: -External hemorrhoids -2 polyps in the descending colon s/p hemostatic clip x 1 -Left-sided diverticulosis -Pathology revealed tubular adenomas -Repeat colonoscopy in 5 years   Had been following with [facility] with last office visit 01/27/23. Was  previously evaluated in 2008 and not placed on the wait list as she was doing too well for transplant.  She has been following closely with transplant regarding management of cirrhosis and HCC in the setting of elevated AFP.  At the time of her office visit she was feeling well and have been working closely with her endocrinologist for controlling her blood sugar and she had lost 8 pounds since January 2024 on Ozempic 2 mg weekly.  She denied any icterus, jaundice, confusion, abdominal swelling, peripheral edema, or tremors.  She has a history of lung nodule and she opted for 1 year surveillance over bronchoscopy and biopsy and is being followed with pulmonology/oncology.  Her case was discussed with Dr. Guido Sander and she was advised to have an MRI of her abdomen  in August 2024 with CT chest at the same time.  Last office visit 03/09/23. Not having much of an appetite however had been on Ozempic.  Reported 30 pound weight loss.  Usually eating at least 1 meal per day and small snacks throughout the day.  Denied melena or BRBPR.  Did have some random vomiting.  Denied jaundice, pruritus.  GERD controlled PPI twice daily.  Had some intermittent constipation but usually at least 1 bowel movement per day.  Cirrhosis diet and lifestyle modifications reinforced.  Continue PPI.  Continue propranolol 30 mg twice daily.  Encouraged to protein shake daily.  Encouraged ongoing follow-up with [facility] and MRI of the abdomen upcoming in August.  Has had televisit with Marin Olp, NP and Lynford Humphrey in September 2024 and January 2025 in regards to cirrhosis follow-up.  She reported she was doing well overall without any major issues.  She reportedly  is maintained on Ozempic and lost about 30 pounds total.  Denied melena, BRBPR, ascites, edema, episodes of HE, or jaundice.  aFP noted to be steadily rising prior to September.  In January she also reported be doing well overall without any major issues to report.  MRI had been  scheduled however she had not completed the one scheduled for November.  Weight stable since being on Ozempic, following with endocrinology.  Reports reduced appetite.  No signs or symptoms to suggest decompensation.  Seen by hematology/oncology 3/28.  Reportedly doing well overall although very little appetite and no energy.  Also with chronic cough.  Due to IDA she has required IV iron in the past.  Labs stable as of 3/20 in regards to ferritin and iron saturation however hemoglobin down to 9.9 from 11.2.  Suspect kidney is playing a role, may benefit from EPO treatment should iron levels remain stable.  Advised if no improvement after next lab draw would recommend further evaluation with additional labs and/or initiation of EPO.  Today:  Cirrhosis history Hematemesis/coffee ground emesis: none History of variceal bleeding: none Abdominal pain: none Abdominal distention/worsening ascites: reports some mild swelling in her abdomen but nothing extreme, no peripheral swelling. No chest pain or SOB.  Fever/chills:  Episodes of confusion/disorientation: none Number of daily bowel movements: Sometimes good and sometimes not =- goes at least once a day. Sometime straining but mostly not. Only about 2-3 min on commode  Taking diuretics?:  Date of last EGD: July 2023 Prior history of banding?:  Yes banding occurred with EGD July 2023 Prior episodes of SBP: never Last time liver imaging was performed: MRI March 2025 -cirrhotic liver with sequelae of portal hypertension.  Posttreatment changes to hepatic segment 7 without suspicious arterial enhancement LR-TR nonviable.  Increased size of arterially hyperenhancing and diffusion restricting lesion in segment 6 LI-RADS 4.  similar right middle lobe consolidation. (Labs and CT chest ordered by Dr. Shelda Destine, CT scheduled for 4/28)  Hepatitis A and B vaccination status: Hep A reactive, vaccinated for hep B.  MELD score: needs updated labs and AFP  Has good  energy when she gets up and moving. Can "shop well". Will have labs done at [facility] - planning to go when she leaves here.   Reflux controlled with PPI BID. Still taking ozempic. Reports she recently gained a little wait. She feels like she maybe has looked jaundice but husband has not noticed anything.   Does not eat a lot at baseline. Does not use ensure very often. Does not adhere to a low sodium diet.   Wt Readings from Last 3 Encounters:  12/27/23 167 lb 3.2 oz (75.8 kg)  09/06/23 164 lb 3.2 oz (74.5 kg)  05/04/23 165 lb 3.2 oz (74.9 kg)    Current Outpatient Medications  Medication Sig Dispense Refill   Accu-Chek Softclix Lancets lancets USE TO CHECK BLOOD SUGAR FOUR TIMES DAILY. Dx: E11.65 200 each 2   Cholecalciferol (VITAMIN D) 2000 units tablet Take 2,000 Units by mouth daily.     Continuous Glucose Receiver (FREESTYLE LIBRE 3 READER) DEVI USE AS DIRECTED 2 each 3   Continuous Glucose Sensor (FREESTYLE LIBRE 3 SENSOR) MISC CHANGE SENSOR EVERY 14 DAYS AS DIRECTED 6 each 3   insulin degludec (TRESIBA FLEXTOUCH) 200 UNIT/ML FlexTouch Pen Inject 10 Units into the skin at bedtime. 9 mL 0   lisinopril (PRINIVIL,ZESTRIL) 10 MG tablet Take 10 mg by mouth daily.     NOVOFINE 32G X 6 MM MISC  USE ONE AS DIRECTED TWICE DAILY.     pantoprazole (PROTONIX) 40 MG tablet Take 1 tablet (40 mg total) by mouth 2 (two) times daily before a meal. 60 tablet 1   propranolol (INDERAL) 10 MG tablet TAKE 3 TABLETS BY MOUTH AM AND TAKE 3 TABLETS BY MOUTH IN THE EVENING 540 tablet 1   Semaglutide, 2 MG/DOSE, (OZEMPIC, 2 MG/DOSE,) 8 MG/3ML SOPN INJECT 2MG  UNDER THE SKIN EVERY WEEK 8 mL 3   No current facility-administered medications for this visit.    Past Medical History:  Diagnosis Date   B12 deficiency 11/03/2016   B12 deficiency 11/03/2016   Barrett's esophagus    Cirrhosis of liver without mention of alcohol    hep B surface antigen and HCV ab negative.pt has not had hepatitis A and B vaccines.U/S  on 07/04/13 shows cirrhosis.   Diverticula of colon    pancolonic   Esophageal reflux    Esophageal varices (HCC)    Hiatal hernia    Iron deficiency anemia, unspecified    Other and unspecified hyperlipidemia    Portal hypertensive gastropathy (HCC)    Tubular adenoma    Type II or unspecified type diabetes mellitus without mention of complication, not stated as uncontrolled    Unspecified essential hypertension    Unspecified hemorrhoids without mention of complication     Past Surgical History:  Procedure Laterality Date   ABDOMINAL HYSTERECTOMY     BIOPSY  04/30/2016   Procedure: BIOPSY;  Surgeon: Corbin Ade, MD;  [facility];  Service: Endoscopy;;  esophagus   BIOPSY  02/24/2019   Procedure: BIOPSY;  Surgeon: Corbin Ade, MD;  [facility];  Service: Endoscopy;;   CESAREAN SECTION     x 2   COLONOSCOPY  03/2006   left sided diverticula, splenic flexure tublar adenoma   COLONOSCOPY  07/2002   villous tubular adenoma in rectum   COLONOSCOPY  09/23/09   external hemorrhoids/scattered pan colonic diverticula otherwise normal. cecal lipoma bx negative, normal TI. Next TCS 09/2014   COLONOSCOPY N/A 11/12/2014   Procedure: COLONOSCOPY;  Surgeon: Corbin Ade, MD;  [facility];  Service: Endoscopy;  Laterality: N/A;  1215 - moved to 12:30 - Ginger to notify pt   COLONOSCOPY N/A 01/07/2017   Rourk: Grade 4 internal hemorrhoids, 5 mm ulcer in the sigmoid colon (benign bx), 7 mm polyp removed from the cecum (tubular adenoma), diverticulosis.next tcs in five years   COLONOSCOPY WITH PROPOFOL N/A 04/01/2022   Procedure: COLONOSCOPY WITH PROPOFOL;  Surgeon: Corbin Ade, MD;  [facility];  Service: Endoscopy;  Laterality: N/A;  8:00am   ESOPHAGEAL BANDING N/A 12/20/2016   Procedure: ESOPHAGEAL BANDING;  Surgeon: West Bali, MD;  [facility];  Service: Endoscopy;  Laterality: N/A;   ESOPHAGEAL BANDING N/A 03/18/2017    Procedure: ESOPHAGEAL BANDING;  Surgeon: Corbin Ade, MD;  [facility];  Service: Endoscopy;  Laterality: N/A;   ESOPHAGEAL BANDING  12/17/2017   Procedure: ESOPHAGEAL BANDING;  Surgeon: Corbin Ade, MD;  [facility];  Service: Endoscopy;;   ESOPHAGEAL BANDING N/A 07/03/2020   Procedure: ESOPHAGEAL BANDING;  Surgeon: Corbin Ade, MD;  [facility];  Service: Endoscopy;  Laterality: N/A;   ESOPHAGEAL BANDING  04/01/2022   Procedure: ESOPHAGEAL BANDING;  Surgeon: Corbin Ade, MD;  [facility];  Service: Endoscopy;;   ESOPHAGOGASTRODUODENOSCOPY  08/2006   barretts esophagus,  no dyplasia   ESOPHAGOGASTRODUODENOSCOPY  03/2006   barretts esophagus,bx focal atypia c/w low grade dysplasia, SB bx negative for celiac   ESOPHAGOGASTRODUODENOSCOPY  09/23/09   3 columns of grade 2 esophageal varices/hiatal hernia/3-4 cm segment Barrett's esophagus without dysplasia. Next EGD 09/2012   ESOPHAGOGASTRODUODENOSCOPY N/A 11/11/2012   Dr. Jena Gauss- Barrett;s esophagus, esophageal varices, portal gastopathy. hiatal hernia   ESOPHAGOGASTRODUODENOSCOPY N/A 04/30/2016   Procedure: ESOPHAGOGASTRODUODENOSCOPY (EGD);  Surgeon: Corbin Ade, MD;  [facility];  Service: Endoscopy;  Laterality: N/A;  815   ESOPHAGOGASTRODUODENOSCOPY N/A 12/20/2016   Procedure: ESOPHAGOGASTRODUODENOSCOPY (EGD);  Surgeon: West Bali, MD;  [facility];  Service: Endoscopy;  Laterality: N/A;   ESOPHAGOGASTRODUODENOSCOPY N/A 01/07/2017   Procedure: ESOPHAGOGASTRODUODENOSCOPY (EGD);  Surgeon: Corbin Ade, MD;  [facility];  Service: Endoscopy;  Laterality: N/A;   ESOPHAGOGASTRODUODENOSCOPY N/A 03/18/2017   Procedure: ESOPHAGOGASTRODUODENOSCOPY (EGD);  Surgeon: Corbin Ade, MD;  [facility];  Service: Endoscopy;  Laterality: N/A;  1030    ESOPHAGOGASTRODUODENOSCOPY N/A 12/17/2017   Procedure: ESOPHAGOGASTRODUODENOSCOPY (EGD);  Surgeon:  Corbin Ade, MD;  [facility];  Service: Endoscopy;  Laterality: N/A;  10:30am   ESOPHAGOGASTRODUODENOSCOPY N/A 02/24/2019   Rourk: No esophageal varices seen, scars noted from prior banding.  5 cm segment of salmon-colored epithelium coming up from the GE junction consistent with Barrett's (bx with no dysplasia).  Portal gastropathy.  Small hiatal hernia.  Recommended repeat EGD in 18 months.   ESOPHAGOGASTRODUODENOSCOPY N/A 07/03/2020   Procedure: ESOPHAGOGASTRODUODENOSCOPY (EGD);  Surgeon: Corbin Ade, MD;  [facility];  Service: Endoscopy;  Laterality: N/A;  10:15am   ESOPHAGOGASTRODUODENOSCOPY (EGD) WITH PROPOFOL N/A 04/01/2022   Procedure: ESOPHAGOGASTRODUODENOSCOPY (EGD) WITH PROPOFOL;  Surgeon: Corbin Ade, MD;  [facility];  Service: Endoscopy;  Laterality: N/A;   HEMORRHOID SURGERY  2008   POLYPECTOMY  04/01/2022   Procedure: POLYPECTOMY;  Surgeon: Corbin Ade, MD;  [facility];  Service: Endoscopy;;  ascending and descending   small bowel capsule endoscopy  10/2009   normal    Family History  Problem Relation Age of Onset   Alzheimer's disease Mother    CAD Father    Diabetes Mellitus II Father    Alzheimer's disease Father    Diabetes Mellitus II Sister    Colon cancer Sister 13       small, surgical excision; chemo/rad not needed   Cancer - Other Brother    Diabetes Mellitus II Brother    Hypertension Brother    Liver disease Neg Hx    Breast cancer Neg Hx     Allergies as of 12/27/2023 - Review Complete 12/27/2023  Allergen Reaction Noted   Codeine Nausea And Vomiting 04/19/2012    Social History   Socioeconomic History   Marital status: Married    Spouse name: Not on file   Number of children: Not on file   Years of education: Not on file   Highest education level: Not on file  Occupational History   Not on file  Tobacco Use   Smoking status: Never   Smokeless tobacco: Never  Vaping Use    Vaping status: Never Used  Substance and Sexual Activity   Alcohol use: No   Drug use: No   Sexual activity: Yes  Other Topics Concern   Not on file  Social History Narrative   Not on file   Social Drivers of Health  Financial Resource Strain: Low Risk  (04/23/2023)   Overall Financial Resource Strain (CARDIA)    Difficulty of Paying Living Expenses: Not hard at all  Food Insecurity: No Food Insecurity (04/16/2022)   Hunger Vital Sign    Worried About Running Out of Food in the Last Year: Never true    Ran Out of Food in the Last Year: Never true  Transportation Needs: No Transportation Needs (04/23/2023)   PRAPARE - Administrator, Civil Service (Medical): No    Lack of Transportation (Non-Medical): No  Physical Activity: Inactive (04/09/2023)   Exercise Vital Sign    Days of Exercise per Week: 0 days    Minutes of Exercise per Session: 0 min  Stress: Not on file  Social Connections: Not on file     Review of Systems   Gen: + weight gain. Denies fever, chills, anorexia. Denies fatigue, weakness, weight loss.  CV: Denies chest pain, palpitations, syncope, peripheral edema, and claudication. Resp: Denies dyspnea at rest, cough, wheezing, coughing up blood, and pleurisy. GI: See HPI Derm: Denies rash, itching, dry skin Psych: Denies depression, anxiety, memory loss, confusion. No homicidal or suicidal ideation.  Heme: Denies bruising, bleeding, and enlarged lymph nodes.  Physical Exam   BP 130/69 (BP Location: Right Arm, Patient Position: Sitting, Cuff Size: Normal)   Pulse 72   Temp 97.7 F (36.5 C) (Oral)   Ht 5' (1.524 m)   Wt 167 lb 3.2 oz (75.8 kg)   SpO2 97%   BMI 32.65 kg/m   General:   Alert and oriented. No distress noted. Pleasant and cooperative.  Head:  Normocephalic and atraumatic. Eyes:  Conjuctiva clear without scleral icterus. Mouth:  Oral mucosa pink and moist. Good dentition. No lesions. Abdomen:  +BS, soft, non-tender. Mild distention,  not taut. No rebound or guarding. Hepatomegaly present. No masses noted. Rectal: deferred Msk:  Symmetrical without gross deformities. Normal posture. Extremities:  Without edema. Neurologic:  Alert and  oriented x4 Psych:  Alert and cooperative. Normal mood and affect.  Assessment  Alisha [name] is a 73 y.o. 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  NASH with HCC:  - Follows with [facility] transplant regularly, scheduled for CT of the chest upcoming in a couple of weeks - Declined liver transplant given poor control of diabetes and low MELD score in 2018. - Also declined liver transplant condition given lung nodule concerning for malignancy - Has had LR for mass in the past treated with Y90 radioembolization in December 2022 - Most recent surveillance MRI with posttreatment changes to his segment 7 without suspicious arterial enhancement LR-TR nonviable but with increased size of arterially hyperenhancing and diffusion restricting lesion in segment 6 LI-RADS 4. -Upcoming chest CT for evaluation of nodule - Continues to have high AFP, last checked in September. - EGD up-to-date in July 2023 with banding of varices, maintained on propranolol twice daily - No reports of pruritus or HE -Patient has concerns about jaundice however she does not appear jaundiced today on physical exam, has been also states no concerns about jaundice at home. - Her abdomen is mildly distended but very soft, not taut.  Evidence of moderate volume ascites on recent MRI.  No peripheral swelling.  We discussed potential for diuretics in the near future and also discussed importance of protein intake - Encouraged Ensure nightly.  If weight  increases further we will start diuretics.  GERD, Barrett's esophagus:  - Well-controlled with pantoprazole 40 mg BID -  Denies nausea, vomiting, dysphagia. - EGD July 2023 with evidence of Barrett's esophagus  PLAN   INR and AFP to calculate MELD - due now with Dr. Shelda Destine Continue with CT as ordered by Dr. Shelda Destine.  Continue follow-up with [facility] for [facility] and chest mass surveillance Continue pantoprazole 40 mg once daily.  GERD diet Consider diurectics for asictes if weight climbs Monitor weight. Low sodium diet reinforced.  Separate written education provided Nightly Ensure Follow up in 6 months.     Julian Obey, MSN, FNP-BC, AGACNP-BC [facility]

## 2023-12-27 NOTE — Patient Instructions (Addendum)
 Cirrhosis Lifestyle Recommendations:  High-protein diet from a primarily plant-based diet. Avoid red meat.  No raw or undercooked meat, seafood, or shellfish. Low-fat/cholesterol/carbohydrate diet. Limit sodium to no more than 2000 mg/day including everything that you eat and drink. Recommend at least 30 minutes of aerobic and resistance exercise 3 days/week. Limit Tylenol to 2000 mg daily.   Continue pantoprazole 40 mg twice daily.   Monitor your weight a few times a week.  If you have more than a 5 pound weight gain in a week or within a month, please let me know.  Also if you begin to develop swelling in your ankles or feet please also notify me.  We may need to consider starting you on some fluid pills given fluid in your belly.  I will follow-up your lab results that you will have done today for [facility].  Please also keep your CT appointment.  Continue their follow-up and recommendations.  I am attaching some information for you today regarding a low-sodium diet.  This is important to help reduce the amount of fluid that develops in your belly related to your liver.  It is important that you get an adequate amount of protein in your diet with liver disease, the more protein you have this can reduce fluid in your belly as well.  I would encourage you to try to drink at least 1 of those ensures nightly.  Follow up in 6 months, sooner if needed.   It was a pleasure to see you today. I want to create trusting relationships with patients. If you receive a survey regarding your visit,  I greatly appreciate you taking time to fill this out on paper or through your MyChart. I value your feedback.  Julian Obey, MSN, FNP-BC, AGACNP-BC [facility]

## 2024-01-10 DIAGNOSIS — R222 Localized swelling, mass and lump, trunk: Secondary | ICD-10-CM | POA: Diagnosis not present

## 2024-01-10 DIAGNOSIS — C220 Liver cell carcinoma: Secondary | ICD-10-CM | POA: Diagnosis not present

## 2024-01-10 DIAGNOSIS — R918 Other nonspecific abnormal finding of lung field: Secondary | ICD-10-CM | POA: Diagnosis not present

## 2024-01-17 ENCOUNTER — Inpatient Hospital Stay: Attending: Oncology

## 2024-01-17 DIAGNOSIS — D500 Iron deficiency anemia secondary to blood loss (chronic): Secondary | ICD-10-CM | POA: Diagnosis not present

## 2024-01-17 DIAGNOSIS — R161 Splenomegaly, not elsewhere classified: Secondary | ICD-10-CM | POA: Insufficient documentation

## 2024-01-17 DIAGNOSIS — K7460 Unspecified cirrhosis of liver: Secondary | ICD-10-CM | POA: Diagnosis not present

## 2024-01-17 DIAGNOSIS — E538 Deficiency of other specified B group vitamins: Secondary | ICD-10-CM

## 2024-01-17 DIAGNOSIS — K922 Gastrointestinal hemorrhage, unspecified: Secondary | ICD-10-CM | POA: Insufficient documentation

## 2024-01-17 DIAGNOSIS — K909 Intestinal malabsorption, unspecified: Secondary | ICD-10-CM | POA: Diagnosis not present

## 2024-01-17 DIAGNOSIS — D696 Thrombocytopenia, unspecified: Secondary | ICD-10-CM | POA: Diagnosis not present

## 2024-01-17 LAB — CBC WITH DIFFERENTIAL/PLATELET
Abs Immature Granulocytes: 0.01 10*3/uL (ref 0.00–0.07)
Basophils Absolute: 0 10*3/uL (ref 0.0–0.1)
Basophils Relative: 1 %
Eosinophils Absolute: 0.2 10*3/uL (ref 0.0–0.5)
Eosinophils Relative: 4 %
HCT: 30.8 % — ABNORMAL LOW (ref 36.0–46.0)
Hemoglobin: 10.1 g/dL — ABNORMAL LOW (ref 12.0–15.0)
Immature Granulocytes: 0 %
Lymphocytes Relative: 20 %
Lymphs Abs: 0.8 10*3/uL (ref 0.7–4.0)
MCH: 31.2 pg (ref 26.0–34.0)
MCHC: 32.8 g/dL (ref 30.0–36.0)
MCV: 95.1 fL (ref 80.0–100.0)
Monocytes Absolute: 0.3 10*3/uL (ref 0.1–1.0)
Monocytes Relative: 8 %
Neutro Abs: 2.7 10*3/uL (ref 1.7–7.7)
Neutrophils Relative %: 67 %
Platelets: 69 10*3/uL — ABNORMAL LOW (ref 150–400)
RBC: 3.24 MIL/uL — ABNORMAL LOW (ref 3.87–5.11)
RDW: 15.5 % (ref 11.5–15.5)
Smear Review: DECREASED
WBC: 4.1 10*3/uL (ref 4.0–10.5)
nRBC: 0 % (ref 0.0–0.2)

## 2024-01-17 LAB — COMPREHENSIVE METABOLIC PANEL WITH GFR
ALT: 12 U/L (ref 0–44)
AST: 29 U/L (ref 15–41)
Albumin: 2.8 g/dL — ABNORMAL LOW (ref 3.5–5.0)
Alkaline Phosphatase: 61 U/L (ref 38–126)
Anion gap: 7 (ref 5–15)
BUN: 14 mg/dL (ref 8–23)
CO2: 23 mmol/L (ref 22–32)
Calcium: 8.9 mg/dL (ref 8.9–10.3)
Chloride: 103 mmol/L (ref 98–111)
Creatinine, Ser: 1.62 mg/dL — ABNORMAL HIGH (ref 0.44–1.00)
GFR, Estimated: 33 mL/min — ABNORMAL LOW (ref >60)
Glucose, Bld: 86 mg/dL (ref 70–99)
Potassium: 4.5 mmol/L (ref 3.5–5.1)
Sodium: 133 mmol/L — ABNORMAL LOW (ref 135–145)
Total Bilirubin: 2.8 mg/dL — ABNORMAL HIGH (ref 0.0–1.2)
Total Protein: 6 g/dL — ABNORMAL LOW (ref 6.5–8.1)

## 2024-01-19 ENCOUNTER — Ambulatory Visit: Admitting: "Endocrinology

## 2024-01-19 ENCOUNTER — Encounter: Payer: Self-pay | Admitting: "Endocrinology

## 2024-01-19 VITALS — BP 120/58 | HR 72 | Ht 60.0 in | Wt 165.4 lb

## 2024-01-19 DIAGNOSIS — E559 Vitamin D deficiency, unspecified: Secondary | ICD-10-CM | POA: Diagnosis not present

## 2024-01-19 DIAGNOSIS — Z789 Other specified health status: Secondary | ICD-10-CM

## 2024-01-19 DIAGNOSIS — I10 Essential (primary) hypertension: Secondary | ICD-10-CM

## 2024-01-19 DIAGNOSIS — Z6832 Body mass index (BMI) 32.0-32.9, adult: Secondary | ICD-10-CM | POA: Diagnosis not present

## 2024-01-19 DIAGNOSIS — Z794 Long term (current) use of insulin: Secondary | ICD-10-CM | POA: Diagnosis not present

## 2024-01-19 DIAGNOSIS — E1169 Type 2 diabetes mellitus with other specified complication: Secondary | ICD-10-CM

## 2024-01-19 DIAGNOSIS — E6609 Other obesity due to excess calories: Secondary | ICD-10-CM | POA: Diagnosis not present

## 2024-01-19 DIAGNOSIS — E66811 Obesity, class 1: Secondary | ICD-10-CM | POA: Diagnosis not present

## 2024-01-19 DIAGNOSIS — E782 Mixed hyperlipidemia: Secondary | ICD-10-CM | POA: Diagnosis not present

## 2024-01-19 LAB — POCT GLYCOSYLATED HEMOGLOBIN (HGB A1C): HbA1c, POC (controlled diabetic range): 5.1 % (ref 0.0–7.0)

## 2024-01-19 NOTE — Patient Instructions (Signed)

## 2024-01-19 NOTE — Progress Notes (Signed)
 01/19/2024, 12:57 PM  Endocrinology follow-up note   Subjective:    Patient ID: Alisha [name], female    DOB: 08-28-1951.  Alisha [name] is being seen in follow-up after she was seen in consultation for management of currently uncontrolled symptomatic diabetes requested by  Orlena Bitters, MD.   Past Medical History:  Diagnosis Date   B12 deficiency 11/03/2016   B12 deficiency 11/03/2016   Barrett's esophagus    Cirrhosis of liver without mention of alcohol     hep B surface antigen and HCV ab negative.pt has not had hepatitis A and B vaccines.U/S on 07/04/13 shows cirrhosis.   Diverticula of colon    pancolonic   Esophageal reflux    Esophageal varices (HCC)    Hiatal hernia    Iron deficiency anemia, unspecified    Other and unspecified hyperlipidemia    Portal hypertensive gastropathy (HCC)    Tubular adenoma    Type II or unspecified type diabetes mellitus without mention of complication, not stated as uncontrolled    Unspecified essential hypertension    Unspecified hemorrhoids without mention of complication     Past Surgical History:  Procedure Laterality Date   ABDOMINAL HYSTERECTOMY     BIOPSY  04/30/2016   Procedure: BIOPSY;  Surgeon: Suzette Espy, MD;  [facility];  Service: Endoscopy;;  esophagus   BIOPSY  02/24/2019   Procedure: BIOPSY;  Surgeon: Suzette Espy, MD;  [facility];  Service: Endoscopy;;   CESAREAN SECTION     x 2   COLONOSCOPY  03/2006   left sided diverticula, splenic flexure tublar adenoma   COLONOSCOPY  07/2002   villous tubular adenoma in rectum   COLONOSCOPY  09/23/09   external hemorrhoids/scattered pan colonic diverticula otherwise normal. cecal lipoma bx negative, normal TI. Next TCS 09/2014   COLONOSCOPY N/A 11/12/2014   Procedure: COLONOSCOPY;  Surgeon: Suzette Espy, MD;  [facility];  Service: Endoscopy;   Laterality: N/A;  1215 - moved to 12:30 - Ginger to notify pt   COLONOSCOPY N/A 01/07/2017   Rourk: Grade 4 internal hemorrhoids, 5 mm ulcer in the sigmoid colon (benign bx), 7 mm polyp removed from the cecum (tubular adenoma), diverticulosis.next tcs in five years   COLONOSCOPY WITH PROPOFOL  N/A 04/01/2022   Procedure: COLONOSCOPY WITH PROPOFOL ;  Surgeon: Suzette Espy, MD;  [facility];  Service: Endoscopy;  Laterality: N/A;  8:00am   ESOPHAGEAL BANDING N/A 12/20/2016   Procedure: ESOPHAGEAL BANDING;  Surgeon: Alyce Jubilee, MD;  [facility];  Service: Endoscopy;  Laterality: N/A;   ESOPHAGEAL BANDING N/A 03/18/2017   Procedure: ESOPHAGEAL BANDING;  Surgeon: Suzette Espy, MD;  [facility];  Service: Endoscopy;  Laterality: N/A;   ESOPHAGEAL BANDING  12/17/2017   Procedure: ESOPHAGEAL BANDING;  Surgeon: Suzette Espy, MD;  [facility];  Service: Endoscopy;;   ESOPHAGEAL BANDING N/A 07/03/2020   Procedure: ESOPHAGEAL BANDING;  Surgeon: Suzette Espy, MD;  [facility];  Service: Endoscopy;  Laterality: N/A;   ESOPHAGEAL BANDING  04/01/2022   Procedure: ESOPHAGEAL BANDING;  Surgeon:  Suzette Espy, MD;  [facility];  Service: Endoscopy;;   ESOPHAGOGASTRODUODENOSCOPY  08/2006   barretts esophagus, no dyplasia   ESOPHAGOGASTRODUODENOSCOPY  03/2006   barretts esophagus,bx focal atypia c/w low grade dysplasia, SB bx negative for celiac   ESOPHAGOGASTRODUODENOSCOPY  09/23/09   3 columns of grade 2 esophageal varices/hiatal hernia/3-4 cm segment Barrett's esophagus without dysplasia. Next EGD 09/2012   ESOPHAGOGASTRODUODENOSCOPY N/A 11/11/2012   Dr. Riley Cheadle- Barrett;s esophagus, esophageal varices, portal gastopathy. hiatal hernia   ESOPHAGOGASTRODUODENOSCOPY N/A 04/30/2016   Procedure: ESOPHAGOGASTRODUODENOSCOPY (EGD);  Surgeon: Suzette Espy, MD;  [facility];  Service: Endoscopy;  Laterality: N/A;  815    ESOPHAGOGASTRODUODENOSCOPY N/A 12/20/2016   Procedure: ESOPHAGOGASTRODUODENOSCOPY (EGD);  Surgeon: Alyce Jubilee, MD;  [facility];  Service: Endoscopy;  Laterality: N/A;   ESOPHAGOGASTRODUODENOSCOPY N/A 01/07/2017   Procedure: ESOPHAGOGASTRODUODENOSCOPY (EGD);  Surgeon: Suzette Espy, MD;  [facility];  Service: Endoscopy;  Laterality: N/A;   ESOPHAGOGASTRODUODENOSCOPY N/A 03/18/2017   Procedure: ESOPHAGOGASTRODUODENOSCOPY (EGD);  Surgeon: Suzette Espy, MD;  [facility];  Service: Endoscopy;  Laterality: N/A;  1030    ESOPHAGOGASTRODUODENOSCOPY N/A 12/17/2017   Procedure: ESOPHAGOGASTRODUODENOSCOPY (EGD);  Surgeon: Suzette Espy, MD;  [facility];  Service: Endoscopy;  Laterality: N/A;  10:30am   ESOPHAGOGASTRODUODENOSCOPY N/A 02/24/2019   Rourk: No esophageal varices seen, scars noted from prior banding.  5 cm segment of salmon-colored epithelium coming up from the GE junction consistent with Barrett's (bx with no dysplasia).  Portal gastropathy.  Small hiatal hernia.  Recommended repeat EGD in 18 months.   ESOPHAGOGASTRODUODENOSCOPY N/A 07/03/2020   Procedure: ESOPHAGOGASTRODUODENOSCOPY (EGD);  Surgeon: Suzette Espy, MD;  [facility];  Service: Endoscopy;  Laterality: N/A;  10:15am   ESOPHAGOGASTRODUODENOSCOPY (EGD) WITH PROPOFOL  N/A 04/01/2022   Procedure: ESOPHAGOGASTRODUODENOSCOPY (EGD) WITH PROPOFOL ;  Surgeon: Suzette Espy, MD;  [facility];  Service: Endoscopy;  Laterality: N/A;   HEMORRHOID SURGERY  2008   POLYPECTOMY  04/01/2022   Procedure: POLYPECTOMY;  Surgeon: Suzette Espy, MD;  [facility];  Service: Endoscopy;;  ascending and descending   small bowel capsule endoscopy  10/2009   normal    Social History   Socioeconomic History   Marital status: Married    Spouse name: Not on file   Number of children: Not on file   Years of education: Not on file   Highest education level: Not on  file  Occupational History   Not on file  Tobacco Use   Smoking status: Never   Smokeless tobacco: Never  Vaping Use   Vaping status: Never Used  Substance and Sexual Activity   Alcohol  use: No   Drug use: No   Sexual activity: Yes  Other Topics Concern   Not on file  Social History Narrative   Not on file   Social Drivers of Health   Financial Resource Strain: Low Risk  (04/23/2023)   Overall Financial Resource Strain (CARDIA)    Difficulty of Paying Living Expenses: Not hard at all  Food Insecurity: No Food Insecurity (04/16/2022)   Hunger Vital Sign    Worried About Running Out of Food in the Last Year: Never true    Ran Out of Food in the Last Year: Never true  Transportation Needs: No Transportation Needs (04/23/2023)   PRAPARE - Administrator, Civil Service (Medical): No    Lack of Transportation (Non-Medical): No  Physical Activity: Inactive (04/09/2023)   Exercise Vital Sign    Days of Exercise per Week: 0 days    Minutes of Exercise per Session: 0 min  Stress: Not on file  Social Connections: Not on file    Family History  Problem Relation Age of Onset   Alzheimer's disease Mother    CAD Father    Diabetes Mellitus II Father    Alzheimer's disease Father    Diabetes Mellitus II Sister    Colon cancer Sister 56       small, surgical excision; chemo/rad not needed   Cancer - Other Brother    Diabetes Mellitus II Brother    Hypertension Brother    Liver disease Neg Hx    Breast cancer Neg Hx     Outpatient Encounter Medications as of 01/19/2024  Medication Sig   Accu-Chek Softclix Lancets lancets USE TO CHECK BLOOD SUGAR FOUR TIMES DAILY. Dx: E11.65   Cholecalciferol (VITAMIN D ) 2000 units tablet Take 2,000 Units by mouth daily.   Continuous Glucose Receiver (FREESTYLE LIBRE 3 READER) DEVI USE AS DIRECTED   Continuous Glucose Sensor (FREESTYLE LIBRE 3 SENSOR) MISC CHANGE SENSOR EVERY 14 DAYS AS DIRECTED   lisinopril (PRINIVIL,ZESTRIL) 10 MG tablet  Take 10 mg by mouth daily.   NOVOFINE 32G X 6 MM MISC USE ONE AS DIRECTED TWICE DAILY.   pantoprazole  (PROTONIX ) 40 MG tablet Take 1 tablet (40 mg total) by mouth 2 (two) times daily before a meal.   propranolol  (INDERAL ) 10 MG tablet TAKE 3 TABLETS BY MOUTH AM AND TAKE 3 TABLETS BY MOUTH IN THE EVENING   Semaglutide , 2 MG/DOSE, (OZEMPIC , 2 MG/DOSE,) 8 MG/3ML SOPN INJECT 2MG  UNDER THE SKIN EVERY WEEK   [DISCONTINUED] insulin  degludec (TRESIBA  FLEXTOUCH) 200 UNIT/ML FlexTouch Pen Inject 10 Units into the skin at bedtime.   No facility-administered encounter medications on file as of 01/19/2024.    ALLERGIES: Allergies  Allergen Reactions   Codeine Nausea And Vomiting    VACCINATION STATUS: Immunization History  Administered Date(s) Administered   Hepatitis B, ADULT 07/21/2017, 12/14/2017, 08/16/2018   Influenza Split 07/07/2007   Influenza, High Dose Seasonal PF 07/04/2017, 06/19/2018, 05/23/2019, 05/23/2019, 07/29/2022   Influenza-Unspecified 07/04/2017   Moderna Sars-Covid-2 Vaccination 11/14/2019, 12/12/2019, 07/17/2020   Pneumococcal Conjugate-13 03/21/2019   Pneumococcal Polysaccharide-23 07/07/2007, 07/25/2019    Diabetes She presents for her follow-up diabetic visit. She has type 2 diabetes mellitus. Onset time: She was diagnosed at approximate age of 39 years. Her disease course has been improving. There are no hypoglycemic associated symptoms. Pertinent negatives for hypoglycemia include no confusion, headaches, pallor or seizures. Pertinent negatives for diabetes include no chest pain, no fatigue, no polydipsia, no polyphagia and no polyuria. There are no hypoglycemic complications. Symptoms are stable. Diabetic complications include nephropathy. Risk factors for coronary artery disease include diabetes mellitus, hypertension, obesity, post-menopausal and family history. She is compliant with treatment most of the time. Her weight is fluctuating minimally. She is following a  generally unhealthy diet. When asked about meal planning, she reported none. She has had a previous visit with a dietitian. She participates in exercise intermittently. Her home blood glucose trend is decreasing steadily. Her breakfast blood glucose range is generally 110-130 mg/dl. Her lunch blood glucose range is generally 110-130 mg/dl. Her dinner blood glucose range is generally 110-130 mg/dl. Her bedtime blood glucose range is generally 110-130 mg/dl. Her overall blood glucose range is 110-130 mg/dl. Alisha [name] presents with continued control of her glycemia.  Her AGP  shows 98% time range, 1% 1 hyperglycemia.  She has 1% Level One hypoglycemia.  Her point-of-care A1c is 5.1% progressively improving.    ) An ACE inhibitor/angiotensin II receptor blocker is being taken. Eye exam is current.  Hypertension This is a chronic problem. The current episode started more than 1 year ago. The problem is controlled. Pertinent negatives include no chest pain, headaches, palpitations or shortness of breath. Risk factors for coronary artery disease include diabetes mellitus, family history, obesity, dyslipidemia, sedentary lifestyle and post-menopausal state. Past treatments include ACE inhibitors. Hypertensive end-organ damage includes kidney disease. Identifiable causes of hypertension include chronic renal disease.     Review of Systems  Constitutional:  Negative for chills, fatigue, fever and unexpected weight change.  HENT:  Negative for trouble swallowing and voice change.   Eyes:  Negative for visual disturbance.  Respiratory:  Negative for cough, shortness of breath and wheezing.   Cardiovascular:  Negative for chest pain, palpitations and leg swelling.  Gastrointestinal:  Negative for diarrhea, nausea and vomiting.  Endocrine: Negative for cold intolerance, heat intolerance, polydipsia, polyphagia and polyuria.  Musculoskeletal:  Negative for arthralgias and myalgias.  Skin:  Negative for color  change, pallor, rash and wound.  Neurological:  Negative for seizures and headaches.  Psychiatric/Behavioral:  Negative for confusion and suicidal ideas.     Objective:       01/19/2024   11:39 AM 12/27/2023    1:49 PM 09/06/2023   11:08 AM  Vitals with BMI  Height 5\' 0"  5\' 0"  5\' 0"   Weight 165 lbs 6 oz 167 lbs 3 oz 164 lbs 3 oz  BMI 32.3 32.65 32.07  Systolic 120 130 161  Diastolic 58 69 64  Pulse 72 72 68    BP (!) 120/58   Pulse 72   Ht 5' (1.524 m)   Wt 165 lb 6.4 oz (75 kg)   BMI 32.30 kg/m   Wt Readings from Last 3 Encounters:  01/19/24 165 lb 6.4 oz (75 kg)  12/27/23 167 lb 3.2 oz (75.8 kg)  09/06/23 164 lb 3.2 oz (74.5 kg)      CMP ( most recent) CMP     Component Value Date/Time   NA 133 (L) 01/17/2024 1132   K 4.5 01/17/2024 1132   CL 103 01/17/2024 1132   CO2 23 01/17/2024 1132   GLUCOSE 86 01/17/2024 1132   BUN 14 01/17/2024 1132   BUN 15 01/25/2020 0000   CREATININE 1.62 (H) 01/17/2024 1132   CREATININE 1.62 (H) 03/28/2018 1146   CALCIUM 8.9 01/17/2024 1132   CALCIUM 8.8 10/16/2021 1354   PROT 6.0 (L) 01/17/2024 1132   ALBUMIN 2.8 (L) 01/17/2024 1132   AST 29 01/17/2024 1132   ALT 12 01/17/2024 1132   ALKPHOS 61 01/17/2024 1132   BILITOT 2.8 (H) 01/17/2024 1132   GFRNONAA 33 (L) 01/17/2024 1132   GFRAA 36 (L) 05/29/2020 1416     Diabetic Labs (most recent): Lab Results  Component Value Date   HGBA1C 5.1 01/19/2024   HGBA1C 5.5 09/06/2023   HGBA1C 5.7 05/04/2023   MICROALBUR 30 03/24/2022   MICROALBUR 25.8 (H) 12/23/2021   MICROALBUR 9.6 (H) 06/20/2021     Lipid Panel ( most recent) Lipid Panel     Component Value Date/Time   CHOL 157 10/27/2022 1152   TRIG 98 10/27/2022 1152   HDL 43 10/27/2022 1152   CHOLHDL 3.7 10/27/2022 1152   VLDL 20 10/27/2022 1152   LDLCALC 94 10/27/2022 1152  Lab Results  Component Value Date   TSH 2.448 10/27/2022   TSH 2.577 11/24/2021   TSH 2.531 12/20/2020   TSH 2.46 01/25/2020    FREET4 1.12 10/27/2022   FREET4 0.92 11/24/2021   FREET4 0.97 12/20/2020      Assessment & Plan:   1. Uncontrolled type 2 diabetes mellitus with stage 3 chronic kidney disease (HCC)  - Alisha [name] has currently uncontrolled symptomatic type 2 DM since 73 years of age.  Alisha [name] presents with continued control of her glycemia.  Her AGP shows 98% time range, 1% 1 hyperglycemia.  She has 1% Level One hypoglycemia.  Her point-of-care A1c is 5.1% progressively improving.   - I had a long discussion with her about the progressive nature of diabetes and the pathology behind its complications. -her diabetes is complicated by CKD and she remains at a high risk for more acute and chronic complications which include CAD, CVA, CKD, retinopathy, and neuropathy. These are all discussed in detail with her.  - I have counseled her on diet  and weight management  by adopting a carbohydrate restricted/protein rich diet. Patient is encouraged to switch to  unprocessed or minimally processed     complex starch and increased protein intake (animal or plant source), fruits, and vegetables. -  she is advised to stick to a routine mealtimes to eat 3 meals  a day and avoid unnecessary snacks ( to snack only to correct hypoglycemia).   Evidently, she is not utilizing her prescribed medication optimally.  She is scans  1-2 on average daily which will not allow her to utilize intensive treatment with basal/bolus insulin .   - she acknowledges that there is a room for improvement in her food and drink choices. - Suggestion is made for her to avoid simple carbohydrates  from her diet including Cakes, Sweet Desserts, Ice Cream, Soda (diet and regular), Sweet Tea, Candies, Chips, Cookies, Store Bought Juices, Alcohol  , Artificial Sweeteners,  Coffee Creamer, and "Sugar-free" Products, Lemonade. This will help patient to have more stable blood glucose profile and potentially avoid unintended weight gain.   The  following Lifestyle Medicine recommendations according to American College of Lifestyle Medicine  [facility]) were discussed and and offered to patient and she  agrees to start the journey:  A. Whole Foods, Plant-Based Nutrition comprising of fruits and vegetables, plant-based proteins, whole-grain carbohydrates was discussed in detail with the patient.   A list for source of those nutrients were also provided to the patient.  Patient will use only water  or unsweetened tea for hydration. B.  The need to stay away from risky substances including alcohol , smoking; obtaining 7 to 9 hours of restorative sleep, at least 150 minutes of moderate intensity exercise weekly, the importance of healthy social connections,  and stress management techniques were discussed. C.  A full color page of  Calorie density of various food groups per pound showing examples of each food groups was provided to the patient.  - I have approached her with the following individualized plan to manage  her diabetes and patient agrees:    -She presents with continued engagement and improvement in her glycemic profile.  She would be tried off of insulin  for now.  She is advised to discontinue her Tresiba  10 units at this time.    She is advised to continue Ozempic  2 mg subcutaneously weekly.  She continues to tolerate this medication.  Side effects and precautions discussed with her.   - she is  not a candidate for Metformin, due to concurrent renal insufficiency. -She may requalify for low-dose SGLT2 inhibitor if needed on subsequent visits.  - Specific targets for  A1c;  LDL, HDL,  and Triglycerides were discussed with the patient.  2) Blood Pressure /Hypertension:  Her blood pressure is controlled to target. she is advised to continue her current medications including lisinopril 10 mg p.o. daily with breakfast .   3) Lipids/Hyperlipidemia:   Review of her recent lipid panel showed uncontrolled LDL at 94.  She reports statin  intolerance.    Whole food plant-based diet discussed and recommended above will help with dyslipidemia as well.  We considered for fasting lipid panel before her next visit.   4)  Weight/Diet:  Body mass index is 32.3 kg/m.  She Of 15 pounds of weight loss.    she is  a candidate for modest weight loss. I discussed with her the fact that loss of 5 - 10% of her  current body weight will have the most impact on her diabetes management.  Exercise, and detailed carbohydrates information provided  -  detailed on discharge instructions.  5) Chronic Care/Health Maintenance:  -she  is on ACEI Statin medications and  is encouraged to initiate and continue to follow up with Ophthalmology, Dentist,  Podiatrist at least yearly or according to recommendations, and advised to   stay away from smoking. I have recommended yearly flu vaccine and pneumonia vaccine at least every 5 years; moderate intensity exercise for up to 150 minutes weekly; and  sleep for at least 7 hours a day.  6.  Vitamin D  deficiency: She is advised to continue cholecalciferol 2000 units daily.   She recently had normal ABI, will be repeated in 5 years.  - she is  advised to maintain close follow up with Orlena Bitters, MD for primary care needs, as well as her other providers for optimal and coordinated care.  I spent  42 minutes in the care of the patient today including review of labs from CMP, Lipids, Thyroid  Function, Hematology (current and previous including abstractions from other facilities); face-to-face time discussing  her blood glucose readings/logs, discussing hypoglycemia and hyperglycemia episodes and symptoms, medications doses, her options of short and long term treatment based on the latest standards of care / guidelines;  discussion about incorporating lifestyle medicine;  and documenting the encounter. Risk reduction counseling performed per USPSTF guidelines to reduce  obesity and cardiovascular risk factors.     Please  refer to Patient Instructions for Blood Glucose Monitoring and Insulin /Medications Dosing Guide"  in media tab for additional information. Please  also refer to " Patient Self Inventory" in the Media  tab for reviewed elements of pertinent patient history.  Alisha [name] participated in the discussions, expressed understanding, and voiced agreement with the above plans.  All questions were answered to her satisfaction. she is encouraged to contact clinic should she have any questions or concerns prior to her return visit.     Follow up plan: - Return in about 4 months (around 05/21/2024) for Bring Meter/CGM Device/Logs- A1c in Office.  Kalvin Orf, MD [facility] [address] [place], [place] [address] Phone: [phone]  Fax: [phone]    01/19/2024, 12:57 PM  This note was partially dictated with voice recognition software. Similar sounding words can be transcribed inadequately or may not  be corrected upon review.

## 2024-01-20 ENCOUNTER — Ambulatory Visit: Payer: Medicare PPO | Admitting: "Endocrinology

## 2024-01-21 ENCOUNTER — Other Ambulatory Visit: Payer: Self-pay | Admitting: "Endocrinology

## 2024-01-21 ENCOUNTER — Inpatient Hospital Stay: Admitting: Oncology

## 2024-01-21 DIAGNOSIS — D696 Thrombocytopenia, unspecified: Secondary | ICD-10-CM

## 2024-01-21 DIAGNOSIS — E538 Deficiency of other specified B group vitamins: Secondary | ICD-10-CM

## 2024-01-21 DIAGNOSIS — D500 Iron deficiency anemia secondary to blood loss (chronic): Secondary | ICD-10-CM

## 2024-01-21 NOTE — Progress Notes (Signed)
 Virtual Visit via Telephone Note  I connected with Alisha [name] on 01/21/24 at  1:30 PM EDT by telephone and verified that I am speaking with the correct person using two identifiers.  Location: Patient: Home Provider: Clinic    I discussed the limitations, risks, security and privacy concerns of performing an evaluation and management service by telephone and the availability of in person appointments. I also discussed with the patient that there may be a patient responsible charge related to this service. The patient expressed understanding and agreed to proceed.   History of Present Illness: Patient is a 73 year old who is followed by hematology for iron deficiency anemia secondary to intermittent GI bleeding and thrombocytopenia (cirrhosis/splenomegaly).  She was last evaluated virtually by me on 12/10/2023.  At her last visit, her hemoglobin dropped and she returns today for repeat lab and evaluation to make sure labs are stable if not improved.  She states today, she is feeling pretty good.  Nothing new to report.  No bleeding.  No recent hospitalizations, surgeries or changes to her baseline health.  She continues to follow with all of her specialty doctors including Dr. Carrolyn Clan, GI and Dr. Monte Antonio.  Reports she is not taking B12 supplements.   Observations/Objective: Review of Systems  Constitutional:  Positive for malaise/fatigue.  Respiratory:  Positive for cough (allergies).     Physical Exam Neurological:     Mental Status: She is alert and oriented to person, place, and time.      Assessment and Plan:  Iron deficiency anemia due to chronic blood loss -Etiology felt to be secondary to history of GI bleeding related to portal gastropathy and gastroesophageal varices.  Has required multiple banding procedures.  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 -She is currently not on oral iron supplements. -Has required intermittent IV Feraheme  last  given in September 2021. -Most recent labs from 01/17/24 shows her  hemoglobin has improved some from previous and is 10.1 (9.9).  CMP shows stable CKD and thrombocytopenia. -We discussed continuing to monitor.  She denies any bleeding.  Kidneys likely playing a part and might benefit from EPO treatment should iron levels remain stable.   -Given stability of her hemoglobin and slight improvement, we will continue to monitor for now. -Patient knows to call us  with any significant changes.  Follow Up Instructions: Return to clinic in 4 months with labs a few days before and an office visit.    I discussed the assessment and treatment plan with the patient. The patient was provided an opportunity to ask questions and all were answered. The patient agreed with the plan and demonstrated an understanding of the instructions.   The patient was advised to call back or seek an in-person evaluation if the symptoms worsen or if the condition fails to improve as anticipated.  I provided 20 minutes of non-face-to-face time during this encounter.   Aurther Blue, NP

## 2024-01-25 DIAGNOSIS — D631 Anemia in chronic kidney disease: Secondary | ICD-10-CM | POA: Diagnosis not present

## 2024-01-25 DIAGNOSIS — N189 Chronic kidney disease, unspecified: Secondary | ICD-10-CM | POA: Diagnosis not present

## 2024-01-25 DIAGNOSIS — R809 Proteinuria, unspecified: Secondary | ICD-10-CM | POA: Diagnosis not present

## 2024-01-25 DIAGNOSIS — E211 Secondary hyperparathyroidism, not elsewhere classified: Secondary | ICD-10-CM | POA: Diagnosis not present

## 2024-01-27 DIAGNOSIS — D6959 Other secondary thrombocytopenia: Secondary | ICD-10-CM | POA: Diagnosis not present

## 2024-01-27 DIAGNOSIS — N1832 Chronic kidney disease, stage 3b: Secondary | ICD-10-CM | POA: Diagnosis not present

## 2024-01-27 DIAGNOSIS — D638 Anemia in other chronic diseases classified elsewhere: Secondary | ICD-10-CM | POA: Diagnosis not present

## 2024-01-27 DIAGNOSIS — K7460 Unspecified cirrhosis of liver: Secondary | ICD-10-CM | POA: Diagnosis not present

## 2024-02-03 ENCOUNTER — Other Ambulatory Visit: Payer: Self-pay | Admitting: "Endocrinology

## 2024-02-09 ENCOUNTER — Other Ambulatory Visit: Payer: Self-pay | Admitting: Gastroenterology

## 2024-02-16 DIAGNOSIS — R9389 Abnormal findings on diagnostic imaging of other specified body structures: Secondary | ICD-10-CM | POA: Diagnosis not present

## 2024-02-16 DIAGNOSIS — N183 Chronic kidney disease, stage 3 unspecified: Secondary | ICD-10-CM | POA: Diagnosis not present

## 2024-02-16 DIAGNOSIS — I129 Hypertensive chronic kidney disease with stage 1 through stage 4 chronic kidney disease, or unspecified chronic kidney disease: Secondary | ICD-10-CM | POA: Diagnosis not present

## 2024-02-16 DIAGNOSIS — C220 Liver cell carcinoma: Secondary | ICD-10-CM | POA: Diagnosis not present

## 2024-02-16 DIAGNOSIS — R846 Abnormal cytological findings in specimens from respiratory organs and thorax: Secondary | ICD-10-CM | POA: Diagnosis not present

## 2024-02-16 DIAGNOSIS — E875 Hyperkalemia: Secondary | ICD-10-CM | POA: Diagnosis not present

## 2024-02-16 DIAGNOSIS — R911 Solitary pulmonary nodule: Secondary | ICD-10-CM | POA: Diagnosis not present

## 2024-02-16 DIAGNOSIS — E1122 Type 2 diabetes mellitus with diabetic chronic kidney disease: Secondary | ICD-10-CM | POA: Diagnosis not present

## 2024-02-16 DIAGNOSIS — E1169 Type 2 diabetes mellitus with other specified complication: Secondary | ICD-10-CM | POA: Diagnosis not present

## 2024-02-16 DIAGNOSIS — R918 Other nonspecific abnormal finding of lung field: Secondary | ICD-10-CM | POA: Diagnosis not present

## 2024-03-30 ENCOUNTER — Other Ambulatory Visit: Payer: Self-pay | Admitting: "Endocrinology

## 2024-03-30 DIAGNOSIS — E1169 Type 2 diabetes mellitus with other specified complication: Secondary | ICD-10-CM

## 2024-04-21 DIAGNOSIS — Z0000 Encounter for general adult medical examination without abnormal findings: Secondary | ICD-10-CM | POA: Diagnosis not present

## 2024-04-21 DIAGNOSIS — I10 Essential (primary) hypertension: Secondary | ICD-10-CM | POA: Diagnosis not present

## 2024-04-21 DIAGNOSIS — R5383 Other fatigue: Secondary | ICD-10-CM | POA: Diagnosis not present

## 2024-04-21 DIAGNOSIS — I8500 Esophageal varices without bleeding: Secondary | ICD-10-CM | POA: Diagnosis not present

## 2024-04-21 DIAGNOSIS — N1832 Chronic kidney disease, stage 3b: Secondary | ICD-10-CM | POA: Diagnosis not present

## 2024-04-21 DIAGNOSIS — Z1331 Encounter for screening for depression: Secondary | ICD-10-CM | POA: Diagnosis not present

## 2024-04-21 DIAGNOSIS — Z1339 Encounter for screening examination for other mental health and behavioral disorders: Secondary | ICD-10-CM | POA: Diagnosis not present

## 2024-04-21 DIAGNOSIS — Z7189 Other specified counseling: Secondary | ICD-10-CM | POA: Diagnosis not present

## 2024-04-21 DIAGNOSIS — Z79899 Other long term (current) drug therapy: Secondary | ICD-10-CM | POA: Diagnosis not present

## 2024-04-21 DIAGNOSIS — E7800 Pure hypercholesterolemia, unspecified: Secondary | ICD-10-CM | POA: Diagnosis not present

## 2024-04-21 DIAGNOSIS — Z299 Encounter for prophylactic measures, unspecified: Secondary | ICD-10-CM | POA: Diagnosis not present

## 2024-04-27 ENCOUNTER — Other Ambulatory Visit: Payer: Self-pay | Admitting: *Deleted

## 2024-04-27 DIAGNOSIS — Z8505 Personal history of malignant neoplasm of liver: Secondary | ICD-10-CM | POA: Diagnosis not present

## 2024-04-27 DIAGNOSIS — R945 Abnormal results of liver function studies: Secondary | ICD-10-CM | POA: Diagnosis not present

## 2024-05-03 ENCOUNTER — Other Ambulatory Visit: Payer: Self-pay | Admitting: "Endocrinology

## 2024-05-05 DIAGNOSIS — C220 Liver cell carcinoma: Secondary | ICD-10-CM | POA: Diagnosis not present

## 2024-05-05 DIAGNOSIS — R911 Solitary pulmonary nodule: Secondary | ICD-10-CM | POA: Diagnosis not present

## 2024-05-05 DIAGNOSIS — K7460 Unspecified cirrhosis of liver: Secondary | ICD-10-CM | POA: Diagnosis not present

## 2024-05-05 DIAGNOSIS — R918 Other nonspecific abnormal finding of lung field: Secondary | ICD-10-CM | POA: Diagnosis not present

## 2024-05-05 DIAGNOSIS — K7689 Other specified diseases of liver: Secondary | ICD-10-CM | POA: Diagnosis not present

## 2024-05-05 DIAGNOSIS — R160 Hepatomegaly, not elsewhere classified: Secondary | ICD-10-CM | POA: Diagnosis not present

## 2024-05-05 DIAGNOSIS — K766 Portal hypertension: Secondary | ICD-10-CM | POA: Diagnosis not present

## 2024-05-05 DIAGNOSIS — R945 Abnormal results of liver function studies: Secondary | ICD-10-CM | POA: Diagnosis not present

## 2024-05-05 DIAGNOSIS — Z8505 Personal history of malignant neoplasm of liver: Secondary | ICD-10-CM | POA: Diagnosis not present

## 2024-05-09 DIAGNOSIS — H47093 Other disorders of optic nerve, not elsewhere classified, bilateral: Secondary | ICD-10-CM | POA: Diagnosis not present

## 2024-05-10 DIAGNOSIS — N189 Chronic kidney disease, unspecified: Secondary | ICD-10-CM | POA: Diagnosis not present

## 2024-05-10 DIAGNOSIS — I517 Cardiomegaly: Secondary | ICD-10-CM | POA: Diagnosis not present

## 2024-05-10 DIAGNOSIS — L03115 Cellulitis of right lower limb: Secondary | ICD-10-CM | POA: Diagnosis not present

## 2024-05-10 DIAGNOSIS — R601 Generalized edema: Secondary | ICD-10-CM | POA: Diagnosis not present

## 2024-05-10 DIAGNOSIS — Z6838 Body mass index (BMI) 38.0-38.9, adult: Secondary | ICD-10-CM | POA: Diagnosis not present

## 2024-05-10 DIAGNOSIS — S90821A Blister (nonthermal), right foot, initial encounter: Secondary | ICD-10-CM | POA: Diagnosis not present

## 2024-05-10 DIAGNOSIS — L089 Local infection of the skin and subcutaneous tissue, unspecified: Secondary | ICD-10-CM | POA: Diagnosis not present

## 2024-05-10 DIAGNOSIS — K7581 Nonalcoholic steatohepatitis (NASH): Secondary | ICD-10-CM | POA: Diagnosis not present

## 2024-05-10 DIAGNOSIS — K7469 Other cirrhosis of liver: Secondary | ICD-10-CM | POA: Diagnosis not present

## 2024-05-10 DIAGNOSIS — X58XXXA Exposure to other specified factors, initial encounter: Secondary | ICD-10-CM | POA: Diagnosis not present

## 2024-05-10 DIAGNOSIS — D696 Thrombocytopenia, unspecified: Secondary | ICD-10-CM | POA: Diagnosis not present

## 2024-05-10 DIAGNOSIS — N179 Acute kidney failure, unspecified: Secondary | ICD-10-CM | POA: Diagnosis not present

## 2024-05-10 DIAGNOSIS — R918 Other nonspecific abnormal finding of lung field: Secondary | ICD-10-CM | POA: Diagnosis not present

## 2024-05-10 DIAGNOSIS — E8779 Other fluid overload: Secondary | ICD-10-CM | POA: Diagnosis not present

## 2024-05-10 DIAGNOSIS — Y929 Unspecified place or not applicable: Secondary | ICD-10-CM | POA: Diagnosis not present

## 2024-05-10 DIAGNOSIS — R600 Localized edema: Secondary | ICD-10-CM | POA: Diagnosis not present

## 2024-05-10 DIAGNOSIS — I10 Essential (primary) hypertension: Secondary | ICD-10-CM | POA: Diagnosis not present

## 2024-05-10 DIAGNOSIS — C220 Liver cell carcinoma: Secondary | ICD-10-CM | POA: Diagnosis not present

## 2024-05-10 DIAGNOSIS — D849 Immunodeficiency, unspecified: Secondary | ICD-10-CM | POA: Diagnosis not present

## 2024-05-10 DIAGNOSIS — R161 Splenomegaly, not elsewhere classified: Secondary | ICD-10-CM | POA: Diagnosis not present

## 2024-05-10 DIAGNOSIS — M7989 Other specified soft tissue disorders: Secondary | ICD-10-CM | POA: Diagnosis not present

## 2024-05-10 DIAGNOSIS — E440 Moderate protein-calorie malnutrition: Secondary | ICD-10-CM | POA: Diagnosis not present

## 2024-05-10 DIAGNOSIS — R110 Nausea: Secondary | ICD-10-CM | POA: Diagnosis not present

## 2024-05-10 DIAGNOSIS — K766 Portal hypertension: Secondary | ICD-10-CM | POA: Diagnosis not present

## 2024-05-10 DIAGNOSIS — D649 Anemia, unspecified: Secondary | ICD-10-CM | POA: Diagnosis not present

## 2024-05-10 DIAGNOSIS — K5900 Constipation, unspecified: Secondary | ICD-10-CM | POA: Diagnosis not present

## 2024-05-10 DIAGNOSIS — Z792 Long term (current) use of antibiotics: Secondary | ICD-10-CM | POA: Diagnosis not present

## 2024-05-10 DIAGNOSIS — K7460 Unspecified cirrhosis of liver: Secondary | ICD-10-CM | POA: Diagnosis not present

## 2024-05-10 DIAGNOSIS — K7290 Hepatic failure, unspecified without coma: Secondary | ICD-10-CM | POA: Diagnosis not present

## 2024-05-10 DIAGNOSIS — R188 Other ascites: Secondary | ICD-10-CM | POA: Diagnosis not present

## 2024-05-10 DIAGNOSIS — N170 Acute kidney failure with tubular necrosis: Secondary | ICD-10-CM | POA: Diagnosis not present

## 2024-05-10 DIAGNOSIS — K8020 Calculus of gallbladder without cholecystitis without obstruction: Secondary | ICD-10-CM | POA: Diagnosis not present

## 2024-05-10 DIAGNOSIS — D684 Acquired coagulation factor deficiency: Secondary | ICD-10-CM | POA: Diagnosis not present

## 2024-05-10 DIAGNOSIS — K22719 Barrett's esophagus with dysplasia, unspecified: Secondary | ICD-10-CM | POA: Diagnosis not present

## 2024-05-11 DIAGNOSIS — K7290 Hepatic failure, unspecified without coma: Secondary | ICD-10-CM | POA: Diagnosis not present

## 2024-05-11 DIAGNOSIS — D649 Anemia, unspecified: Secondary | ICD-10-CM | POA: Diagnosis not present

## 2024-05-11 DIAGNOSIS — I10 Essential (primary) hypertension: Secondary | ICD-10-CM | POA: Diagnosis not present

## 2024-05-11 DIAGNOSIS — K766 Portal hypertension: Secondary | ICD-10-CM | POA: Diagnosis not present

## 2024-05-11 DIAGNOSIS — R161 Splenomegaly, not elsewhere classified: Secondary | ICD-10-CM | POA: Diagnosis not present

## 2024-05-11 DIAGNOSIS — I517 Cardiomegaly: Secondary | ICD-10-CM | POA: Diagnosis not present

## 2024-05-11 DIAGNOSIS — R918 Other nonspecific abnormal finding of lung field: Secondary | ICD-10-CM | POA: Diagnosis not present

## 2024-05-11 DIAGNOSIS — K7460 Unspecified cirrhosis of liver: Secondary | ICD-10-CM | POA: Diagnosis not present

## 2024-05-11 DIAGNOSIS — K7581 Nonalcoholic steatohepatitis (NASH): Secondary | ICD-10-CM | POA: Diagnosis not present

## 2024-05-11 DIAGNOSIS — D696 Thrombocytopenia, unspecified: Secondary | ICD-10-CM | POA: Diagnosis not present

## 2024-05-11 DIAGNOSIS — K22719 Barrett's esophagus with dysplasia, unspecified: Secondary | ICD-10-CM | POA: Diagnosis not present

## 2024-05-11 DIAGNOSIS — M7989 Other specified soft tissue disorders: Secondary | ICD-10-CM | POA: Diagnosis not present

## 2024-05-11 DIAGNOSIS — C220 Liver cell carcinoma: Secondary | ICD-10-CM | POA: Diagnosis not present

## 2024-05-11 DIAGNOSIS — R188 Other ascites: Secondary | ICD-10-CM | POA: Diagnosis not present

## 2024-05-11 DIAGNOSIS — L089 Local infection of the skin and subcutaneous tissue, unspecified: Secondary | ICD-10-CM | POA: Diagnosis not present

## 2024-05-11 DIAGNOSIS — N179 Acute kidney failure, unspecified: Secondary | ICD-10-CM | POA: Diagnosis not present

## 2024-05-11 DIAGNOSIS — K8020 Calculus of gallbladder without cholecystitis without obstruction: Secondary | ICD-10-CM | POA: Diagnosis not present

## 2024-05-12 DIAGNOSIS — D649 Anemia, unspecified: Secondary | ICD-10-CM | POA: Diagnosis not present

## 2024-05-12 DIAGNOSIS — C220 Liver cell carcinoma: Secondary | ICD-10-CM | POA: Diagnosis not present

## 2024-05-12 DIAGNOSIS — R188 Other ascites: Secondary | ICD-10-CM | POA: Diagnosis not present

## 2024-05-12 DIAGNOSIS — K7581 Nonalcoholic steatohepatitis (NASH): Secondary | ICD-10-CM | POA: Diagnosis not present

## 2024-05-12 DIAGNOSIS — K7290 Hepatic failure, unspecified without coma: Secondary | ICD-10-CM | POA: Diagnosis not present

## 2024-05-12 DIAGNOSIS — N179 Acute kidney failure, unspecified: Secondary | ICD-10-CM | POA: Diagnosis not present

## 2024-05-12 DIAGNOSIS — L089 Local infection of the skin and subcutaneous tissue, unspecified: Secondary | ICD-10-CM | POA: Diagnosis not present

## 2024-05-12 DIAGNOSIS — R601 Generalized edema: Secondary | ICD-10-CM | POA: Diagnosis not present

## 2024-05-12 DIAGNOSIS — K766 Portal hypertension: Secondary | ICD-10-CM | POA: Diagnosis not present

## 2024-05-12 DIAGNOSIS — D696 Thrombocytopenia, unspecified: Secondary | ICD-10-CM | POA: Diagnosis not present

## 2024-05-12 DIAGNOSIS — R918 Other nonspecific abnormal finding of lung field: Secondary | ICD-10-CM | POA: Diagnosis not present

## 2024-05-13 DIAGNOSIS — C220 Liver cell carcinoma: Secondary | ICD-10-CM | POA: Diagnosis not present

## 2024-05-14 DIAGNOSIS — K7581 Nonalcoholic steatohepatitis (NASH): Secondary | ICD-10-CM | POA: Diagnosis not present

## 2024-05-14 DIAGNOSIS — N189 Chronic kidney disease, unspecified: Secondary | ICD-10-CM | POA: Diagnosis not present

## 2024-05-14 DIAGNOSIS — C220 Liver cell carcinoma: Secondary | ICD-10-CM | POA: Diagnosis not present

## 2024-05-14 DIAGNOSIS — K7460 Unspecified cirrhosis of liver: Secondary | ICD-10-CM | POA: Diagnosis not present

## 2024-05-14 DIAGNOSIS — R601 Generalized edema: Secondary | ICD-10-CM | POA: Diagnosis not present

## 2024-05-14 DIAGNOSIS — L03115 Cellulitis of right lower limb: Secondary | ICD-10-CM | POA: Diagnosis not present

## 2024-05-14 DIAGNOSIS — M7989 Other specified soft tissue disorders: Secondary | ICD-10-CM | POA: Diagnosis not present

## 2024-05-14 DIAGNOSIS — N179 Acute kidney failure, unspecified: Secondary | ICD-10-CM | POA: Diagnosis not present

## 2024-05-14 DIAGNOSIS — Z792 Long term (current) use of antibiotics: Secondary | ICD-10-CM | POA: Diagnosis not present

## 2024-05-14 DIAGNOSIS — R600 Localized edema: Secondary | ICD-10-CM | POA: Diagnosis not present

## 2024-05-14 NOTE — Consults (Signed)
 Nephrology Consult Note: Acute Kidney Injury  History of Present Illness   Alisha [name] is a 73 y.o. 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 Nephrology consulted for AKI on CKD3.   She feels well since coming in the hospital, she has been able to get some more mobility with getting diuresis. When she arrived, she was diuresed with IV lasix 40x2 and then albumin challenge on 8/28. She has been treated for infection with zosyn and linezolid and one dose of vanc. No LVP performed this admission and has no SBP.   She has been voiding spontaneously. Denied NSAIDs, no contrasted studies. EF normal.   ROS   The review of systems was negative unless otherwise stated in the HPI.   Past Medical History   Patient Active Problem List   Diagnosis Date Noted  . Soft tissue infection of foot 05/11/2024  . Portal hypertension with esophageal varices (CMS/HHS-HCC) 05/11/2024  . Acute kidney injury superimposed on CKD () 05/11/2024  . Morbid obesity (CMS/HHS-HCC) 05/11/2024  . Chronic anemia 05/11/2024  . Hypomagnesemia 05/11/2024  . Decompensated cirrhosis (CMS/HHS-HCC) 05/11/2024  . Mass of middle lobe of right lung 05/11/2024  . Abnormal CT of the chest 02/09/2024  . Lung nodule seen on imaging study 10/26/2022  . Statin intolerance 04/14/2022  . Hepatocellular carcinoma (CMS/HHS-HCC) 07/01/2021  . Opacity of lung on imaging study 11/07/2020  . Stage 3 chronic kidney disease (CMS/HHS-HCC) 06/28/2019  . Pre-transplant evaluation for liver transplant 07/22/2017  . Pre-transplant evaluation for chronic liver disease 07/21/2017  . NASH (nonalcoholic steatohepatitis)   . Liver cirrhosis secondary to NASH (CMS/HHS-HCC)   . Hypertension   . Diabetes mellitus type 2 in obese (CMS/HHS-HCC)   . Hyperlipidemia associated with type 2 diabetes mellitus  (CMS/HHS-HCC)   . Central obesity   . Iron deficiency   . Thrombocytopenia () 12/19/2016  . Diabetes mellitus (CMS/HHS-HCC) 08/30/2009  . Esophageal reflux 08/30/2009  . Barretts esophagus 08/30/2009    Social History   Socioeconomic History  . Marital status: Married  Tobacco Use  . Smoking status: Never    Passive exposure: Past  . Smokeless tobacco: Never  Vaping Use  . Vaping status: Never Used  Substance and Sexual Activity  . Alcohol  use: Never  . Drug use: Never  . Sexual activity: Not Currently  Social History Narrative   Retired for health insurance in the hospital   Accompanied by [employer] (husband)   Two children, adult age in [place] and [place]   Social Drivers of Health   Financial Resource Strain: Low Risk  (05/12/2024)   Overall Financial Resource Strain (CARDIA)   . Difficulty of Paying Living Expenses: Not hard at all  Food Insecurity: No Food Insecurity (05/12/2024)   Hunger Vital Sign   . Worried About Programme researcher, broadcasting/film/video in the Last Year: Never true   . Ran Out of Food in the Last Year: Never true  Transportation Needs: No Transportation Needs (05/12/2024)   PRAPARE - Transportation   . Lack of Transportation (Medical): No   . Lack of Transportation (Non-Medical): No  Physical Activity: Inactive (04/09/2023)   Received from [facility]   Exercise Vital Sign   . On average, how many days per week do you engage in moderate to strenuous exercise (like a brisk walk)?: 0 days   . On average, how  many minutes do you engage in exercise at this level?: 0 min  Housing Stability: Low Risk  (05/12/2024)   Housing Stability Vital Sign   . Unable to Pay for Housing in the Last Year: No   . Number of Times Moved in the Last Year: 1   . Homeless in the Last Year: No    Family History  Problem Relation Name Age of Onset  . Alzheimer's disease Mother    . Alzheimer's disease Father    . Coronary Artery Disease (Blocked arteries around heart) Father    .  Diabetes Sister    . Diabetes Sister    . Diabetes Sister    . Diabetes Brother    . Diabetes Brother    . Diabetes Brother    . Liver disease Neg Hx    . HCC Neg Hx    . Colorectal neoplasia Neg Hx    . Anesthesia problems Neg Hx       Allergies   Allergies  Allergen Reactions  . Codeine Vomiting and Nausea And Vomiting    Other reaction(s): Other (see comments)     Home Medications   Current Outpatient Medications  Medication Instructions  . blood-glucose meter,continuous (FREESTYLE LIBRE 3 READER) Misc   . blood-glucose sensor (FREESTYLE LIBRE 3 SENSOR) 1 each  . cholecalciferol (VITAMIN D3) 2,000 Units, Daily  . lisinopriL (ZESTRIL) 10 mg, Daily  . NOVOTWIST 32 gauge x 1/5 Ndle No dose, route, or frequency recorded.  . OZEMPIC  2 mg, Weekly  . pantoprazole  (PROTONIX ) 40 mg, 2 times Daily  . propranoloL  (INDERAL ) 30 mg, 2 times Daily     Active Medications   . ertapenem (INVANZ) IV  0.5 g Intravenous Q24H  . [Held by provider] heparin  (porcine)  5,000 Units Subcutaneous Q8H SCH  . lactulose  30 mL Oral BID  . [Held by provider] lisinopriL  10 mg Oral Daily  . pantoprazole   40 mg Oral BID  . [Held by provider] propranoloL   30 mg Oral BID  . [START ON 05/15/2024] vancomycin (VANCOCIN) adult IV (peripheral)  1 g Intravenous Q48H    Objective    Current Vital Signs 24h Vital Sign Ranges  T 36.5 C (97.7 F) Temp  Avg: 36.4 C (97.6 F)  Min: 36.4 C (97.5 F)  Max: 36.6 C (97.9 F)  BP 113/50 BP  Min: 100/50  Max: 121/49  HR 72 Pulse  Avg: 75  Min: 71  Max: 78  RR 18 Resp  Avg: 18.5  Min: 17  Max: 22  SaO2 100 %   SpO2  Avg: 99.9 %  Min: 99 %  Max: 100 %          Ins & Outs I/O last 2 completed shifts: In: 730 [P.O.:720; I.V.:10] Out: 0  Current Shift:  No intake/output data recorded.    Physical Examination   Physical Exam Gen:   NAD CV:   RRR, no extra heart sounds, JVP flat Pulm:   Normal WOB, CTAB Abd:   Soft, NT, fluid wave,  MSK:   4+ pitting  edema to the hips   Access: none    Laboratory Studies   Recent Labs  Lab 05/12/24 0620 05/13/24 0558 05/14/24 0501  WBC 3.0* 3.3 2.3*  HGB 7.5* 7.3* 6.9*  PLT 47* 46* 39*   Recent Labs  Lab 05/12/24 0620 05/13/24 0558 05/14/24 0501  NA 135 134* 138  K 4.5 4.3 4.6  CL 108 106 108  CO2 19*  20* 19*  BUN 19 20 21*  CREATININE 2.4* 2.6* 2.8*  CALCIUM 9.1 9.0 9.0  ALKPHOS 37 34 32  GLUCOSE 142* 130 127   Lab Results  Component Value Date   CALCIUM 9.0 05/14/2024   Lab Results  Component Value Date   IRON 88 05/14/2024   TIBC <98 (L) 05/14/2024   FERRITIN 259 (H) 05/14/2024   PCTSAT >90 (H) 05/14/2024    Recent Labs  Lab 05/11/24 0832 05/14/24 0906  COLORU Yellow Light Yellow  CLARITYU Clear Clear  SPECGRAV 1.020 1.013  LABPH 5.5 5.0  PROTEINUA Negative Negative  GLUCOSEU Negative Negative  KETONESU Negative Negative  BLOODU Negative Negative  NITRITE Positive* Negative  LEUKOCYTESUR Negative Negative  BILIRUBINUR Negative Negative  UROBILINOGEN 0.2 0.2  RBCUA 3  --   WBCUA 3  --   SQUAMEPI 7  --   HYALINE 36  --     No results found for: OSMORAND No results found for: NAUR  Dialysis Access            Invasive Urinary Catheters      Assessment and Recommendations   73 y.o. 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 Nephrology consulted for AKI on CKD3.   Nonoliguric AKI  on CKD3 Decompensated liver disease Volume overload Baseline creatinine: 1.6 ; admission creatinine 2.5. Urine output: 9 unmeasured voids in 24 hours. Potential kidney insults: no recent contrast exposure, Currently on antibiotics, borderline MAPs of 65, heart function: EF 55% this admisson. Suspect AKI most likely related to progression of kidney disease in the setting of cirrhosis, rule out HRS, or ATN as a contributing factor.  Will  review urine independently Repeat urine sodium tomorrow AM No indication for renal replacement therapy Per review, not a candidate for OLT We discussed the implications of this with regard to her kidney function. We discussed the modalities of dialysis. I expressed that her GFR is around the 20s now and while she doesn't have an acute indication for iHD, she may develop one in the future. She and her husband will discuss further.  Sees Dr. Hassie in [place] Agree with 1 u pRBCs today Avoid hypotension Hold lisinopril propranolol  and diuretics No need for additional albumin today Consider starting midodrine if developing MAPs < 60 Minimal improvement in volume overload with diuresis of 40 of lasix, though she feels well Daily renal function panel  Strict I/Os  Standard Recommendations Daily standing weights when feasible Daily renal function panel  Please dose all medications for reduced eGFR <20  Avoid NSAIDs, morphine , and baclofen. Avoid magnesium-containing laxatives (milk of magnesia, magnesium hydroxide) and enemas (SMOG). Avoid phosphate-containing laxative (sodium phosphate) enemas (Fleet phospho-soda). Use caution when administering iodinated contrast with eGFR < 30 Please monitor intake and urine output. Primary team should consider removing Foley if appropriate and present. If removed, it is recommended to utilize non invasive means of urine quantification and to follow the [facility] Urinary Retention Algorithm (attachment 3)  Thank you for this consult. We will continue to follow the patient with you.  Please feel free to call with questions or concerns.    NORLEEN PATSY KINGSLEY, MD  Floor service pager [phone]   The Nephrology Consult team is available in-house from 7A-5P. If urgent assistance is needed outside of these hours, please page the Nephrology on-call pager [phone] for assistance. The Nephrology consult pagers  are available after business hours for emergencies  only.   Attestation Statement:   I personally saw and evaluated the patient, and participated in the management and treatment plan as documented in the resident/fellow note.  SHERWOOD JAYSON NEEDY, MD

## 2024-05-15 DIAGNOSIS — L03115 Cellulitis of right lower limb: Secondary | ICD-10-CM | POA: Diagnosis not present

## 2024-05-15 DIAGNOSIS — N189 Chronic kidney disease, unspecified: Secondary | ICD-10-CM | POA: Diagnosis not present

## 2024-05-15 DIAGNOSIS — R110 Nausea: Secondary | ICD-10-CM | POA: Diagnosis not present

## 2024-05-15 DIAGNOSIS — C220 Liver cell carcinoma: Secondary | ICD-10-CM | POA: Diagnosis not present

## 2024-05-15 DIAGNOSIS — K5900 Constipation, unspecified: Secondary | ICD-10-CM | POA: Diagnosis not present

## 2024-05-15 DIAGNOSIS — N179 Acute kidney failure, unspecified: Secondary | ICD-10-CM | POA: Diagnosis not present

## 2024-05-16 DIAGNOSIS — C220 Liver cell carcinoma: Secondary | ICD-10-CM | POA: Diagnosis not present

## 2024-05-16 DIAGNOSIS — N179 Acute kidney failure, unspecified: Secondary | ICD-10-CM | POA: Diagnosis not present

## 2024-05-16 DIAGNOSIS — L03115 Cellulitis of right lower limb: Secondary | ICD-10-CM | POA: Diagnosis not present

## 2024-05-16 DIAGNOSIS — N189 Chronic kidney disease, unspecified: Secondary | ICD-10-CM | POA: Diagnosis not present

## 2024-05-16 NOTE — Discharge Summary (Signed)
 [facility] Medicine Discharge Summary  Admit Date: 05/10/2024 Discharge Date: 05/17/2024  Admitting Physician: No admitting provider for patient encounter. Discharge Physician: No att. providers found  Primary Care Provider: [name], MD, Phone [phone]  Discharge Destination: Home  Admission Diagnoses:  Wound infection [T14.8XXA, L08.9]  Discharge Diagnoses:  Principal Problem:   Soft tissue infection of foot Active Problems:   Liver cirrhosis secondary to NASH (CMS/HHS-HCC)   Hypertension   Hyperlipidemia associated with type 2 diabetes mellitus (CMS/HHS-HCC)   Hepatocellular carcinoma (CMS/HHS-HCC)   Esophageal reflux   Thrombocytopenia ()   Portal hypertension with esophageal varices (CMS/HHS-HCC)   Acute kidney injury superimposed on CKD ()   Barretts esophagus   Statin intolerance   Morbid obesity (CMS/HHS-HCC)   Chronic anemia   Hypomagnesemia   Decompensated cirrhosis (CMS/HHS-HCC)   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 *  Primary Diagnosis: Admitted for decompensated cirrhosis, volume overload, R foot cellulitis.  Changes Made (with rationale):  Started on doxy/cipro  for 14 day course for cellulitis (through 9/10) Started on lasix 80mg  daily for volume overload Started lactulose for mild HE Held lisinopril for AKI Started spironolactone 25mg  per hepatology  To-Do List (incidental findings, follow-up studies, etc.): Repeat BMP in 1 week  Anticipatory Guidance for Outpatient Care:  If Cr stable on repeat, would benefit from resuming lisinopril Anticipate lasix can be downtitrated once euvolemic to a lower maintenance dose External referral provided for wound clinic for ongoing care of R foot cellulitis  Patient is high risk for readmission    Results Pending at Discharge:  None Please see phone numbers at end of this summary for lab contact information.   Follow-up/Care  Transition Plan: Kidney follow up 05/29/2024, Dr. Gaynell Rachele Pierce. appts: Future Appointments  Date Time Provider Department Center  05/31/2024  3:00 PM Hadassah Lauraine Bonito, NP [facility]    Follow-up info: No follow-up provider specified.     Allergies/Intolerances:  Allergies  Allergen Reactions  . Codeine Vomiting and Nausea And Vomiting    Other reaction(s): Other (see comments)     New Adverse Drug Events: none  Medications:     Current Discharge Medication List     PAUSE taking these medications      Instructions  lisinopriL 10 MG tablet Wait to take this until your doctor or other care provider tells you to start again. Refills: 0  Commonly known as: ZESTRIL Take 10 mg by mouth once daily       START taking these medications      Instructions  ciprofloxacin  HCl 500 MG tablet Quantity: 8 tablet Refills: 0 Stop taking on: May 25, 2024  Commonly known as: CIPRO  Take 1 tablet (500 mg total) by mouth once daily for 8 days   doxycycline 100 MG capsule Quantity: 16 capsule Refills: 0 Stop taking on: May 25, 2024  Commonly known as: MONODOX Take 1 capsule (100 mg total) by mouth 2 (two) times daily for 8 days   FUROsemide 80 MG tablet Quantity: 30 tablet Refills: 0  Commonly known as: LASIX Take 1 tablet (80 mg total) by mouth once daily Last time this was given: Ask your nurse or doctor   lactulose 10 gram/15 mL oral solution Quantity: 600 mL Refills: 0  Commonly known as: ENULOSE Take 30 mLs by mouth 2 (two) times daily for 10 days Last time this was given: 30 mLs on May 17, 2024  9:10  AM   spironolactone 25 MG tablet Quantity: 90 tablet Refills: 0  Commonly known as: ALDACTONE Take 1 tablet (25 mg total) by mouth once daily Doctor's comments: Check BMP 1-2 weeks after starting.       CONTINUE taking these medications      Instructions  cholecalciferol 2,000 unit tablet Refills: 0  Commonly known  as: VITAMIN D3 Take 2,000 Units by mouth once daily Last time this was given: 2,000 Units on May 17, 2024  9:07 AM   FREESTYLE LIBRE 3 READER Misc Refills: 0    FREESTYLE LIBRE 3 SENSOR Refills: 0  1 each   OZEMPIC  2 mg/dose (8 mg/3 mL) pen injector Refills: 0 Generic drug: semaglutide   Inject 2 mg subcutaneously once a week   pantoprazole  40 MG DR tablet Refills: 0  Commonly known as: PROTONIX  Take 40 mg by mouth 2 (two) times daily Last time this was given: 40 mg on May 17, 2024  9:10 AM   propranoloL  10 MG tablet Refills: 11  Commonly known as: INDERAL  Take 30 mg by mouth 2 (two) times daily Last time this was given: 30 mg on May 17, 2024  9:11 AM         Brief History of Present Illness: As per HPI  Alisha [name] is a 73 y.o. female with PMHx MASH Cirrhosis with Portal Hypertension with Grade 2 Varcies s/p Banding, HCC, RML Mass, HTN, T2DM, HLD, CKD3b, Barrett's Esophagus, GERD and Morbid Obesity who was sent from [facility] with concern for soft tissue infection of right foot and decompensated cirrhosis. Ms. Sweatt reports weight gain with worsening abdominal distension and fluid build up on dependent areas over the last 1-1.5 weeks. She also reports development of blisters on the right foot over the last week or so that have also been worsening with some having ruptured with underlying erythematous base. She was seen in Liver Clinic 8/27 and ultimately instructed to come to the ED for further evaluation of right foot and management of decompensated cirrhosis. Denied fevers, chills, chest pain, shortness of breath, abdominal pain, nausea, vomiting, changes in urinary/bowel patterns and other skin changes.   In the ED, T97.3, HR 73, BP 110/83, RR 18 and SpO2 90s on RA. Workup notable for WBC 5.4, Hb 9.4, PLT 87, Cr 2.5 (baseline 1.6-1.7), Sodium 133, CO2 19, Mg 1.5, Tbili 4.4, PT 18.7, INR 1.6, Lactate 1.6, CRP 0.44, ESR 4, Ammonia 69, BNP 2185 ->  1882, CK 85. Blood Culture x2 sent. CXR with redemonstrated right middle lobe mass. Right Foot XR with no definite osseous erosive changes although MRI would be more sensitive in the acute setting if there is concern for osteomyelitis. Vascular Surgery consulted who have low suspicion for necrotizing infection. Started on IV Linezolid/Zosyn. Admitted to Hospital Medicine for further management.  _____________________   Hospital Course by Problem:  # Foot infection, non-purulent cellulitis of R dorsal foot with bullae Reports development of blisters on the right foot over the week prior to presentation. Evaluated by vascular surgery who had low suspicion for necrotizing infection. Started on linezolid/zosyn, then transitioned to vanc/zosyn for non-purulent SSTI with bullae in an immunocompromised host. ID consulted, recommended vanc/erta. CT foot without discrete fluid collection or soft tissue gas. Likely skin infection 2/2 LE edema.  Per ID, can transition to PO doxy/cipro  to complete 14 day course (through 9/10). External referral provided for wound care. Patient and family declined referral for home health nursing for wound care.    #  Acute kidney injury, cardiorenal vs. pre-renal vs. HRS # CKD Cr 2.5 on arrival from baseline 1.6.  Renal US  without hydronephrosis. Received albumin challenge without significant improvement however low suspicion for hepatorenal. Cr initially uptrending with diuresis. Nephrology consulted, felt Cr could represent new baseline. Resumed diureis with improving Cr, 2.1 on discharge   # Nausea and vomiting New onset nausea with small volume emesis on 8/31. Believes 2/2 lactulose. Has not been having frequent BMs on lactulose. Other new medication is ertapenem. AXR showed no acute findings, possible increased stool burden. Improved with increased Bms.   # Decompensated MASH Cirrhosis # History of Portal Hypertension with Grade 2 Varices s/p Banding # History of HCC #  Generalized edema # Ascites # Coagulopathy 2/2 liver disease Initially evaluated for Liver Transplant 07/2017 but ultimately not listed due to low MELD score and poor DM control. A1c improved however then was discovered to have lung nodule concerning for malignancy and for this reason has not been deemed candidate for OLT. She was found to have LR 5 mass c/f HCC and underwent Y90 radioembolization 08/2021. MRI 11/2023 showed LR-TR nonviable in segment 7, LR-4 lesion in segment 6. More recent evaluation with uptrending AFP c/f worsening HCC. Admitted with generalized edema and ascites consistent with acute decompensated cirrhosis. Diag para without concern for SBP. Liver US  without PVT. TTE with normal BiV function. Admitted with decompensation possibly 2/2 infection vs. worsening HCC. Edema improving with diuretics. Received a few days IV diuresis with good urine output. Recommended for continue inpatient IV diuresis however family strongly preferred dc home with follow-up. Started on PO lasix but anticipate will need to titrate down once euvolemic. Held propranolol  initially then resumed. Started on spironolactone at discharge per hepatology recommendations.    # Acute on chronic Anemia # Abdominal wall hematoma # Acute on chronic thrombocytopenia Baseline Hb ~9-10. Has been downtrending now requiring pRBC transfusion x1. Noted to have large ecchymosis on L abdominal wall in the setting of attempted paracentesis with POCUS concerning for hematoma. Anemia workup with replete iron, B12 and folate. Hematoma stable on exam. Hemoglobin stable at time of discharge   # Asterixis # Confusion # Concern for HE Patient was confused on exam with asterixis 8/29. Started lactulose and sennakot, last BM  05/17/24. Asterixis now resolved.    # Right Middle Lobe Mass with concern for Malignancy  RML mass identified 2022 currently under surveillance. CT-guided Biopsy with negative pathology 10/2020. CT Chest 12/2023  with increasing mass in RML, underwent bronchoscopy which was non-diagnostic. Repeat CT Chest 04/2024 with slight decrease in size of irregular right middle lobe soft tissue mass, stable right lower lobe groundglass nodule. - follow-up outpatient for ongoing management   # HTN  - hold home lisinopril 10mg  daily given AKI, can resume at outpatient follow-up   # T2DM  Last A1c 5.1 01/2024.  - held home ozempic , can resume on discharge - no issues with hyperglycemia   # Barrett's Esophagus  # GERD - continued home pantoprazole  40mg  BID  Malnutrition: She has been diagnosed with moderate protein-calorie malnutrition in the context of acute illness, based on energy intake meeting < 75% of estimated requirement for > 7 days, mild subcutaneous fat loss, moderate muscle mass loss.  - Recommend oral nutrition, Recommend trend weight status, Recommend vitamin/mineral supplementation, Recommend oral nutrition supplements.    Surgeries and Procedures Performed:  None  _____________________  Discharge Exam:  BP 114/51 (BP Location: Left forearm, Patient Position: Sitting)   Pulse 81  Temp 36.6 C (97.9 F) (Oral)   Resp 18   Ht 152.4 cm (5')   Wt 92.4 kg (203 lb 11.3 oz)   SpO2 100%   BMI 39.78 kg/m    Physical Exam: BP 114/51 (BP Location: Left forearm, Patient Position: Sitting)   Pulse 81   Temp 36.6 C (97.9 F) (Oral)   Resp 18   Ht 152.4 cm (5')   Wt 92.4 kg (203 lb 11.3 oz)   SpO2 100%   BMI 39.78 kg/m  Constitutional: chronically ill-appearing, jaundiced HEENT: [place]/AT, PERRL, OP-moist without lesion Neck: supple  Cardiovascular: Heart sounds regular rate and rhythm, S1 & S2 without S3 or S4 gallops, rubs, or murmurs Respiratory: Lungs clear to auscultation bilaterally. No rales, wheezes, or rhonchi. No respiratory distress. Abdomen: Soft, nontenderness, BS present. Extremities: No cyanosis, 2+ pitting edema to thighs. R foot bandaged.   Pertinent Lab Testing: Recent  Labs  Lab 05/15/24 0249 05/16/24 0459 05/17/24 0623  NA 136 134* 134*  K 4.5 4.0 3.9  CL 108 105 105  CO2 20* 19* 20*  BUN 19 21* 21*  CREATININE 2.8* 2.5* 2.1*  GLUCOSE 158* 157* 155*  CALCIUM 9.0 9.2 9.3   Recent Labs  Lab 05/15/24 0249 05/16/24 0459 05/17/24 0623  AST 27 30 27   ALT 11 11 11   ALKPHOS 34 36 35  TBILI 6.6* 4.1* 3.5*    Recent Labs  Lab 05/15/24 1040 05/16/24 0459 05/17/24 0623  WBC 3.9 4.4 2.9*  HGB 9.1* 8.8* 7.9*  HCT 27.3* 26.0* 23.6*  PLT 37* 37* 26*   Recent Labs  Lab 05/11/24 1636 05/12/24 0620 05/15/24 0249 05/16/24 0459 05/17/24 0623  APTT 42.7*  --   --   --   --   INR 1.9*   < > 1.8* 1.8* 1.9*   < > = values in this interval not displayed.     Other Pertinent Labs:  CPK 85 ProBNP 1882  Iron 88 TIBC <98 Tsat >90 Ferritin 259 Folate 10.9 B12 485  Ammonia 68 CRP 0.44  Fibrinogen 97  Micro:  Lab Results  Component Value Date   BLDCULT No growth detected. 05/10/2024   BLDCULT No growth detected. 05/10/2024    No results found for: SARSCOV2 No results found for: BARD GOODNESS, RSVRNA    Pertinent Imaging:   No results found.  _____________________  Code Status: Full Code Goals of care were not addressed during this admission.   Status on Discharge:  Current activity: Walks occasionally (05/17/24 0905) Current mobility: Slightly limited (05/17/24 0905)  Activity Recommendation: activity as tolerated  Other Discharge Instructions: Services setup at discharge: None Tubes/lines at discharge: None  Diet: Diet 2 GM NA Oral Supplements - Adult All Supplements; Ensure High Protein-Assorted; Breakfast and Dinner  Wound Care Order Instructions             Wound Care  As directed       Comments: 1. Moisten gauze with hypochlorous acid (Vashe- 118 ml DJE#[id number]; SAP# [id number]), place in wound bed to soak for 3-5 minutes, then remove.  2. Apply No Sting Skin Barrier Film [phone]; SAP#  [id number]-lollipop) to the periwound skin and allow to dry completely.   3. Dress the wound with silver foam (Mepilex AG non-bordered 4x8 SAP# [id number]) and secure with roll gauze and tape. 4. Change every 2-3 days and prn if soiled.  Question:  Body Site  Answer:  right foot      Wound Care  As directed  Comments: Follow the Pressure Injury (Ulcer) and/or Risk for Development Policy (turn patient every 2 hours in bed, use a waffle cushion in the chair, offload heels, pad bony prominences and medical devices with foam per policy, rotate medical devices per policy, educate patient and family on importance of repositioning, etc).              _____________________  Time spent on discharge process: 35 minutes    JULIA EARNIE EMERALD, MD [facility]  05/17/2024   Hospital Contact Information:  [facility]) [facility]) [facility])  Pending tests:  Laboratory: [phone] Microbiology: [phone] Pathology: [phone] Radiology: [phone]  General questions: [phone] Pending tests: Laboratory: [phone] Microbiology: [phone] Pathology: [phone] Radiology: [phone]  General questions:  [phone] Pending tests:  Laboratory: [phone] Microbiology: [phone] Pathology: [phone] Radiology: [phone]  General questions:  [phone]

## 2024-05-17 DIAGNOSIS — K7290 Hepatic failure, unspecified without coma: Secondary | ICD-10-CM | POA: Diagnosis not present

## 2024-05-17 DIAGNOSIS — R601 Generalized edema: Secondary | ICD-10-CM | POA: Diagnosis not present

## 2024-05-17 DIAGNOSIS — N189 Chronic kidney disease, unspecified: Secondary | ICD-10-CM | POA: Diagnosis not present

## 2024-05-17 DIAGNOSIS — K7581 Nonalcoholic steatohepatitis (NASH): Secondary | ICD-10-CM | POA: Diagnosis not present

## 2024-05-17 DIAGNOSIS — N179 Acute kidney failure, unspecified: Secondary | ICD-10-CM | POA: Diagnosis not present

## 2024-05-17 DIAGNOSIS — C220 Liver cell carcinoma: Secondary | ICD-10-CM | POA: Diagnosis not present

## 2024-05-17 DIAGNOSIS — L03115 Cellulitis of right lower limb: Secondary | ICD-10-CM | POA: Diagnosis not present

## 2024-05-17 DIAGNOSIS — K7460 Unspecified cirrhosis of liver: Secondary | ICD-10-CM | POA: Diagnosis not present

## 2024-05-17 NOTE — Progress Notes (Signed)
 Nephrology Consult Progress Note  Interval History    - NAEON - Cr 2.1 g/dL today, down from 2.5. Lower than most recent prior baseline - UOP 3.2 L - No acute complaints, planning on discharge later today  Medications   . cholecalciferol  2,000 Units Oral Daily  . ertapenem (INVANZ) IV  0.5 g Intravenous Q24H  . [Held by provider] heparin  (porcine)  5,000 Units Subcutaneous Q8H SCH  . lactulose  30 mL Oral BID  . [Held by provider] lisinopriL  10 mg Oral Daily  . pantoprazole   40 mg Oral BID  . propranoloL   30 mg Oral BID  . sennosides-docusate  2 tablet Oral BID  . TORsemide  80 mg Oral Daily  . vancomycin (VANCOCIN) adult IV (peripheral)  1 g Intravenous Q48H    Objective     Current Vital Signs 24h Vital Sign Ranges  T 36.6 C (97.9 F) Temp  Avg: 36.6 C (97.9 F)  Min: 36.5 C (97.7 F)  Max: 36.7 C (98 F)  BP 114/51 BP  Min: 114/51  Max: 149/64  HR 81 Pulse  Avg: 95.7  Min: 81  Max: 104  RR 18 Resp  Avg: 18  Min: 18  Max: 18  SaO2 100 %   SpO2  Avg: 97.4 %  Min: 95 %  Max: 100 %          Ins & Outs I/O last 2 completed shifts: In: 840 [P.O.:840] Out: 3200 [Urine:3200] Current Shift:  09/03 0701 - 09/03 1900 In: 130 [P.O.:120; I.V.:10] Out: 450 [Urine:450]   Physical Exam Gen:                NAD CV:                  RRR, no extra heart sounds Pulm:               Normal WOB, CTAB Abd:                 Soft, NTND MSK:               pitting edema to the hips is better  Access:           none    Labs    Recent Labs  Lab 05/15/24 1040 05/16/24 0459 05/17/24 0623  WBC 3.9 4.4 2.9*  HGB 9.1* 8.8* 7.9*  PLT 37* 37* 26*   Recent Labs  Lab 05/15/24 0249 05/16/24 0459 05/17/24 0623  NA 136 134* 134*  K 4.5 4.0 3.9  CL 108 105 105  CO2 20* 19* 20*  BUN 19 21* 21*  CREATININE 2.8* 2.5* 2.1*  CALCIUM 9.0 9.2 9.3  ALKPHOS 34 36 35  GLUCOSE 158* 157* 155*   Lab Results  Component Value Date   CALCIUM 9.3 05/17/2024   Lab Results  Component  Value Date   IRON 88 05/14/2024   TIBC <98 (L) 05/14/2024   FERRITIN 259 (H) 05/14/2024     Assessment and Recommendations   73 y.o. 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 Nephrology consulted for AKI on CKD3.   Nonoliguric AKI  on CKD3 Decompensated liver disease Volume overload Baseline creatinine: ~2.5 ; admission creatinine 2.5. Urine output: 9 unmeasured voids in 24 hours on day of consult. Potential kidney insults: no recent contrast exposure, Currently  on antibiotics, borderline MAPs of 65, heart function: EF 55% this admisson. Suspect AKI most likely related to progression of kidney disease in the setting of cirrhosis with maybe ATN as a contributing factor. No indication for renal replacement therapy Per review, not a candidate for OLT We discussed the implications of this with regard to her kidney function. We discussed the modalities of dialysis. I expressed that her GFR is around the 20s now and while she doesn't have an acute indication for iHD, she may develop one in the future. She and her husband will discuss further.  Sees Dr. Hassie in [place] Discharge diuretic regimen: furosemide 80 mg PO daily. Outpatient Nephrology can adjust as necessary on follow-up soon. Minimal improvement in volume overload with diuresis of 40 of lasix, though she feels well Daily renal function panel  Strict I/Os  Standard Recommendations   Please dose all medications for reduced eGFR. Avoid NSAIDs, morphine , and baclofen. Avoid magnesium-containing laxatives (milk of magnesia, magnesium hydroxide) and enemas (SMOG). Avoid phosphate-containing laxative (sodium phosphate) enemas (Fleet phospho-soda). Use caution when administering iodinated contrast with eGFR < 30  Thank you for this consult. We will continue to follow the patient with you.  Please feel free  to call with questions or concerns.   Garrel Dellen, MD Nephrology Fellow  Floor service pager [phone]    Attestation Statement:   I personally saw and evaluated the patient, and participated in the management and treatment plan as documented in the resident/fellow note.  Ruediger Naaman Cinnamon, MD   The Nephrology Consult team is available in-house from 7A-5P. If urgent assistance is needed outside of these hours, please page the Nephrology on-call pager [phone] for assistance. The Nephrology consult pagers are available after business hours for emergencies only.

## 2024-05-23 ENCOUNTER — Ambulatory Visit: Admitting: "Endocrinology

## 2024-05-23 ENCOUNTER — Encounter: Payer: Self-pay | Admitting: "Endocrinology

## 2024-05-23 VITALS — BP 110/52 | HR 84 | Ht 60.0 in | Wt 175.0 lb

## 2024-05-23 DIAGNOSIS — E1169 Type 2 diabetes mellitus with other specified complication: Secondary | ICD-10-CM

## 2024-05-23 DIAGNOSIS — N189 Chronic kidney disease, unspecified: Secondary | ICD-10-CM | POA: Diagnosis not present

## 2024-05-23 DIAGNOSIS — I10 Essential (primary) hypertension: Secondary | ICD-10-CM

## 2024-05-23 DIAGNOSIS — E782 Mixed hyperlipidemia: Secondary | ICD-10-CM

## 2024-05-23 DIAGNOSIS — R809 Proteinuria, unspecified: Secondary | ICD-10-CM | POA: Diagnosis not present

## 2024-05-23 DIAGNOSIS — Z794 Long term (current) use of insulin: Secondary | ICD-10-CM | POA: Diagnosis not present

## 2024-05-23 DIAGNOSIS — D631 Anemia in chronic kidney disease: Secondary | ICD-10-CM | POA: Diagnosis not present

## 2024-05-23 LAB — POCT GLYCOSYLATED HEMOGLOBIN (HGB A1C): HbA1c, POC (controlled diabetic range): 5.6 % (ref 0.0–7.0)

## 2024-05-23 NOTE — Progress Notes (Signed)
 05/23/2024, 12:36 PM  Endocrinology follow-up note   Subjective:    Patient ID: Alisha [name], female    DOB: 74/03/1951.  Alisha KATHEE Mersman is being seen in follow-up after she was seen in consultation for management of currently uncontrolled symptomatic diabetes requested by  Rosamond Leta KATHEE, MD.   Past Medical History:  Diagnosis Date   B12 deficiency 11/03/2016   B12 deficiency 11/03/2016   Barrett's esophagus    Cirrhosis of liver without mention of alcohol     hep B surface antigen and HCV ab negative.pt has not had hepatitis A and B vaccines.U/S on 07/04/13 shows cirrhosis.   Diverticula of colon    pancolonic   Esophageal reflux    Esophageal varices (HCC)    Hiatal hernia    Iron deficiency anemia, unspecified    Other and unspecified hyperlipidemia    Portal hypertensive gastropathy (HCC)    Tubular adenoma    Type II or unspecified type diabetes mellitus without mention of complication, not stated as uncontrolled    Unspecified essential hypertension    Unspecified hemorrhoids without mention of complication     Past Surgical History:  Procedure Laterality Date   ABDOMINAL HYSTERECTOMY     BIOPSY  04/30/2016   Procedure: BIOPSY;  Surgeon: Lamar CHRISTELLA Hollingshead, MD;  [facility];  Service: Endoscopy;;  esophagus   BIOPSY  02/24/2019   Procedure: BIOPSY;  Surgeon: Hollingshead Lamar CHRISTELLA, MD;  [facility];  Service: Endoscopy;;   CESAREAN SECTION     x 2   COLONOSCOPY  03/2006   left sided diverticula, splenic flexure tublar adenoma   COLONOSCOPY  07/2002   villous tubular adenoma in rectum   COLONOSCOPY  09/23/09   external hemorrhoids/scattered pan colonic diverticula otherwise normal. cecal lipoma bx negative, normal TI. Next TCS 09/2014   COLONOSCOPY N/A 11/12/2014   Procedure: COLONOSCOPY;  Surgeon: Lamar CHRISTELLA Hollingshead, MD;  [facility];  Service: Endoscopy;   Laterality: N/A;  1215 - moved to 12:30 - Ginger to notify pt   COLONOSCOPY N/A 01/07/2017   Rourk: Grade 4 internal hemorrhoids, 5 mm ulcer in the sigmoid colon (benign bx), 7 mm polyp removed from the cecum (tubular adenoma), diverticulosis.next tcs in five years   COLONOSCOPY WITH PROPOFOL  N/A 04/01/2022   Procedure: COLONOSCOPY WITH PROPOFOL ;  Surgeon: Hollingshead Lamar CHRISTELLA, MD;  [facility];  Service: Endoscopy;  Laterality: N/A;  8:00am   ESOPHAGEAL BANDING N/A 12/20/2016   Procedure: ESOPHAGEAL BANDING;  Surgeon: Margo LITTIE Haddock, MD;  [facility];  Service: Endoscopy;  Laterality: N/A;   ESOPHAGEAL BANDING N/A 03/18/2017   Procedure: ESOPHAGEAL BANDING;  Surgeon: Hollingshead Lamar CHRISTELLA, MD;  [facility];  Service: Endoscopy;  Laterality: N/A;   ESOPHAGEAL BANDING  12/17/2017   Procedure: ESOPHAGEAL BANDING;  Surgeon: Hollingshead Lamar CHRISTELLA, MD;  [facility];  Service: Endoscopy;;   ESOPHAGEAL BANDING N/A 07/03/2020   Procedure: ESOPHAGEAL BANDING;  Surgeon: Hollingshead Lamar CHRISTELLA, MD;  [facility];  Service: Endoscopy;  Laterality: N/A;   ESOPHAGEAL BANDING  04/01/2022   Procedure: ESOPHAGEAL BANDING;  Surgeon:  Shaaron Lamar HERO, MD;  [facility];  Service: Endoscopy;;   ESOPHAGOGASTRODUODENOSCOPY  08/2006   barretts esophagus, no dyplasia   ESOPHAGOGASTRODUODENOSCOPY  03/2006   barretts esophagus,bx focal atypia c/w low grade dysplasia, SB bx negative for celiac   ESOPHAGOGASTRODUODENOSCOPY  09/23/09   3 columns of grade 2 esophageal varices/hiatal hernia/3-4 cm segment Barrett's esophagus without dysplasia. Next EGD 09/2012   ESOPHAGOGASTRODUODENOSCOPY N/A 11/11/2012   Dr. Shaaron- Barrett;s esophagus, esophageal varices, portal gastopathy. hiatal hernia   ESOPHAGOGASTRODUODENOSCOPY N/A 04/30/2016   Procedure: ESOPHAGOGASTRODUODENOSCOPY (EGD);  Surgeon: Lamar HERO Shaaron, MD;  [facility];  Service: Endoscopy;  Laterality: N/A;  815    ESOPHAGOGASTRODUODENOSCOPY N/A 12/20/2016   Procedure: ESOPHAGOGASTRODUODENOSCOPY (EGD);  Surgeon: Margo LITTIE Haddock, MD;  [facility];  Service: Endoscopy;  Laterality: N/A;   ESOPHAGOGASTRODUODENOSCOPY N/A 01/07/2017   Procedure: ESOPHAGOGASTRODUODENOSCOPY (EGD);  Surgeon: Shaaron Lamar HERO, MD;  [facility];  Service: Endoscopy;  Laterality: N/A;   ESOPHAGOGASTRODUODENOSCOPY N/A 03/18/2017   Procedure: ESOPHAGOGASTRODUODENOSCOPY (EGD);  Surgeon: Shaaron Lamar HERO, MD;  [facility];  Service: Endoscopy;  Laterality: N/A;  1030    ESOPHAGOGASTRODUODENOSCOPY N/A 12/17/2017   Procedure: ESOPHAGOGASTRODUODENOSCOPY (EGD);  Surgeon: Shaaron Lamar HERO, MD;  [facility];  Service: Endoscopy;  Laterality: N/A;  10:30am   ESOPHAGOGASTRODUODENOSCOPY N/A 02/24/2019   Rourk: No esophageal varices seen, scars noted from prior banding.  5 cm segment of salmon-colored epithelium coming up from the GE junction consistent with Barrett's (bx with no dysplasia).  Portal gastropathy.  Small hiatal hernia.  Recommended repeat EGD in 18 months.   ESOPHAGOGASTRODUODENOSCOPY N/A 07/03/2020   Procedure: ESOPHAGOGASTRODUODENOSCOPY (EGD);  Surgeon: Shaaron Lamar HERO, MD;  [facility];  Service: Endoscopy;  Laterality: N/A;  10:15am   ESOPHAGOGASTRODUODENOSCOPY (EGD) WITH PROPOFOL  N/A 04/01/2022   Procedure: ESOPHAGOGASTRODUODENOSCOPY (EGD) WITH PROPOFOL ;  Surgeon: Shaaron Lamar HERO, MD;  [facility];  Service: Endoscopy;  Laterality: N/A;   HEMORRHOID SURGERY  2008   POLYPECTOMY  04/01/2022   Procedure: POLYPECTOMY;  Surgeon: Shaaron Lamar HERO, MD;  [facility];  Service: Endoscopy;;  ascending and descending   small bowel capsule endoscopy  10/2009   normal    Social History   Socioeconomic History   Marital status: Married    Spouse name: Not on file   Number of children: Not on file   Years of education: Not on file   Highest education level: Not on  file  Occupational History   Not on file  Tobacco Use   Smoking status: Never   Smokeless tobacco: Never  Vaping Use   Vaping status: Never Used  Substance and Sexual Activity   Alcohol  use: No   Drug use: No   Sexual activity: Yes  Other Topics Concern   Not on file  Social History Narrative   Not on file   Social Drivers of Health   Financial Resource Strain: Low Risk  (05/12/2024)   Received [facility]   Overall Financial Resource Strain (CARDIA)    Difficulty of Paying Living Expenses: Not hard at all  Food Insecurity: No Food Insecurity (05/12/2024)   Received from [facility]   Hunger Vital Sign    Within the past 12 months, you worried that your food would run out before you got the money to buy more.: Never true    Within the past 12 months, the food you bought just didn't  last and you didn't have money to get more.: Never true  Transportation Needs: No Transportation Needs (05/12/2024)   Received from [facility] - [facility]    In the past 12 months, has lack of transportation kept you from medical appointments or from getting medications?: No    Lack of Transportation (Non-Medical): No  Physical Activity: Inactive (04/09/2023)   Exercise Vital Sign    Days of Exercise per Week: 0 days    Minutes of Exercise per Session: 0 min  Stress: Not on file  Social Connections: Not on file    Family History  Problem Relation Age of Onset   Alzheimer's disease Mother    CAD Father    Diabetes Mellitus II Father    Alzheimer's disease Father    Diabetes Mellitus II Sister    Colon cancer Sister 38       small, surgical excision; chemo/rad not needed   Cancer - Other Brother    Diabetes Mellitus II Brother    Hypertension Brother    Liver disease Neg Hx    Breast cancer Neg Hx     Outpatient Encounter Medications as of 05/23/2024  Medication Sig   ciprofloxacin  (CIPRO ) 500 MG tablet Take 500 mg  by mouth.   doxycycline (MONODOX) 100 MG capsule Take 100 mg by mouth 2 (two) times daily.   furosemide (LASIX) 80 MG tablet Take 80 mg by mouth daily.   lactulose, encephalopathy, (CHRONULAC) 10 GM/15ML SOLN Take 30 mLs by mouth 2 (two) times daily.   spironolactone (ALDACTONE) 25 MG tablet Take 25 mg by mouth daily.   Accu-Chek Softclix Lancets lancets USE TO CHECK BLOOD SUGAR FOUR TIMES DAILY. Dx: E11.65   Cholecalciferol (VITAMIN D ) 2000 units tablet Take 2,000 Units by mouth daily.   Continuous Glucose Receiver (FREESTYLE LIBRE 3 READER) DEVI USE AS DIRECTED   Continuous Glucose Sensor (FREESTYLE LIBRE 3 SENSOR) MISC CHANGE SENSOR EVERY 14 DAYS AS DIRECTED   lisinopril (PRINIVIL,ZESTRIL) 10 MG tablet Take 10 mg by mouth daily. (Patient not taking: Reported on 05/23/2024)   NOVOFINE 32G X 6 MM MISC USE ONE AS DIRECTED TWICE DAILY.   pantoprazole  (PROTONIX ) 40 MG tablet Take 1 tablet (40 mg total) by mouth 2 (two) times daily before a meal.   propranolol  (INDERAL ) 10 MG tablet TAKE 3 TABLETS BY MOUTH IN THE MORNING AND TAKE 3 TABLETS BY MOUTH IN THE EVENING   Semaglutide , 2 MG/DOSE, (OZEMPIC , 2 MG/DOSE,) 8 MG/3ML SOPN INJECT 2MG  UNDER THE SKIN ONE TIME WEEKLY   No facility-administered encounter medications on file as of 05/23/2024.    ALLERGIES: Allergies  Allergen Reactions   Codeine Nausea And Vomiting    VACCINATION STATUS: Immunization History  Administered Date(s) Administered   Hepatitis B, ADULT 07/21/2017, 12/14/2017, 08/16/2018   INFLUENZA, HIGH DOSE SEASONAL PF 07/04/2017, 06/19/2018, 05/23/2019, 05/23/2019, 07/29/2022   Influenza Split 07/07/2007   Influenza-Unspecified 07/04/2017   Moderna Sars-Covid-2 Vaccination 11/14/2019, 12/12/2019, 07/17/2020   Pneumococcal Conjugate-13 03/21/2019   Pneumococcal Polysaccharide-23 07/07/2007, 07/25/2019    Diabetes She presents for her follow-up diabetic visit. She has type 2 diabetes mellitus. Onset time: She was diagnosed at  approximate age of 39 years. Her disease course has been stable. There are no hypoglycemic associated symptoms. Pertinent negatives for hypoglycemia include no confusion, headaches, pallor or seizures. Pertinent negatives for diabetes include no chest pain, no fatigue, no polydipsia, no polyphagia and no polyuria. There are no hypoglycemic complications. Symptoms are stable. Diabetic complications  include nephropathy. Risk factors for coronary artery disease include diabetes mellitus, hypertension, obesity, post-menopausal and family history. She is compliant with treatment most of the time. Her weight is fluctuating minimally. She is following a generally unhealthy diet. When asked about meal planning, she reported none. She has had a previous visit with a dietitian. She participates in exercise intermittently. Her home blood glucose trend is fluctuating minimally. Her breakfast blood glucose range is generally 130-140 mg/dl. Her lunch blood glucose range is generally 130-140 mg/dl. Her dinner blood glucose range is generally 130-140 mg/dl. Her bedtime blood glucose range is generally 130-140 mg/dl. Her overall blood glucose range is 130-140 mg/dl. Croft presents with continued controlled glycemic profile despite her recent hospitalization for a soft tissue infection on her right lower extremity, currently finishing her antibiotic course.  Her AGP report shows 83% time range, 17% level 1 hyperglycemia.  She was not given the Ozempic  while she was in the hospital.  Her point-of-care A1c remains at 5.6% today.  She has no hypoglycemia.      ) An ACE inhibitor/angiotensin II receptor blocker is being taken. Eye exam is current.  Hypertension This is a chronic problem. The current episode started more than 1 year ago. The problem is controlled. Pertinent negatives include no chest pain, headaches, palpitations or shortness of breath. Risk factors for coronary artery disease include diabetes mellitus, family  history, obesity, dyslipidemia, sedentary lifestyle and post-menopausal state. Past treatments include ACE inhibitors. Hypertensive end-organ damage includes kidney disease. Identifiable causes of hypertension include chronic renal disease.     Objective:       05/23/2024   10:35 AM 01/19/2024   11:39 AM 12/27/2023    1:49 PM  Vitals with BMI  Height 5' 0 5' 0 5' 0  Weight 175 lbs 165 lbs 6 oz 167 lbs 3 oz  BMI 34.18 32.3 32.65  Systolic 110 120 869  Diastolic 52 58 69  Pulse 84 72 72    BP (!) 110/52   Pulse 84   Ht 5' (1.524 m)   Wt 175 lb (79.4 kg)   BMI 34.18 kg/m   Wt Readings from Last 3 Encounters:  05/23/24 175 lb (79.4 kg)  01/19/24 165 lb 6.4 oz (75 kg)  12/27/23 167 lb 3.2 oz (75.8 kg)      CMP ( most recent) CMP     Component Value Date/Time   NA 133 (L) 01/17/2024 1132   K 4.5 01/17/2024 1132   CL 103 01/17/2024 1132   CO2 23 01/17/2024 1132   GLUCOSE 86 01/17/2024 1132   BUN 14 01/17/2024 1132   BUN 15 01/25/2020 0000   CREATININE 1.62 (H) 01/17/2024 1132   CREATININE 1.62 (H) 03/28/2018 1146   CALCIUM 8.9 01/17/2024 1132   CALCIUM 8.8 10/16/2021 1354   PROT 6.0 (L) 01/17/2024 1132   ALBUMIN 2.8 (L) 01/17/2024 1132   AST 29 01/17/2024 1132   ALT 12 01/17/2024 1132   ALKPHOS 61 01/17/2024 1132   BILITOT 2.8 (H) 01/17/2024 1132   GFRNONAA 33 (L) 01/17/2024 1132   GFRAA 36 (L) 05/29/2020 1416     Diabetic Labs (most recent): Lab Results  Component Value Date   HGBA1C 5.6 05/23/2024   HGBA1C 5.1 01/19/2024   HGBA1C 5.5 09/06/2023   MICROALBUR 30 03/24/2022   MICROALBUR 25.8 (H) 12/23/2021   MICROALBUR 9.6 (H) 06/20/2021     Lipid Panel ( most recent) Lipid Panel     Component Value Date/Time   CHOL  157 10/27/2022 1152   TRIG 98 10/27/2022 1152   HDL 43 10/27/2022 1152   CHOLHDL 3.7 10/27/2022 1152   VLDL 20 10/27/2022 1152   LDLCALC 94 10/27/2022 1152      Lab Results  Component Value Date   TSH 2.448 10/27/2022   TSH 2.577  11/24/2021   TSH 2.531 12/20/2020   TSH 2.46 01/25/2020   FREET4 1.12 10/27/2022   FREET4 0.92 11/24/2021   FREET4 0.97 12/20/2020      Assessment & Plan:   1. Uncontrolled type 2 diabetes mellitus with stage 3 chronic kidney disease (HCC)  - Alisha NOVAK Rabadi has currently uncontrolled symptomatic type 2 DM since 73 years of age.  Deniese presents with continued controlled glycemic profile despite her recent hospitalization for a soft tissue infection on her right lower extremity, currently finishing her antibiotic course.  Her AGP report shows 83% time range, 17% level 1 hyperglycemia.  She was not given the Ozempic  while she was in the hospital.  Her point-of-care A1c remains at 5.6% today.  She has no hypoglycemia.    - I had a long discussion with her about the progressive nature of diabetes and the pathology behind its complications. -her diabetes is complicated by CKD and she remains at a high risk for more acute and chronic complications which include CAD, CVA, CKD, retinopathy, and neuropathy. These are all discussed in detail with her.  - I have counseled her on diet  and weight management  by adopting a carbohydrate restricted/protein rich diet. Patient is encouraged to switch to  unprocessed or minimally processed     complex starch and increased protein intake (animal or plant source), fruits, and vegetables. -  she is advised to stick to a routine mealtimes to eat 3 meals  a day and avoid unnecessary snacks ( to snack only to correct hypoglycemia).   Evidently, she is not utilizing her prescribed medication optimally.  She is scans  1-2 on average daily which will not allow her to utilize intensive treatment with basal/bolus insulin .   - she acknowledges that there is a room for improvement in her food and drink choices. - Suggestion is made for her to avoid simple carbohydrates  from her diet including Cakes, Sweet Desserts, Ice Cream, Soda (diet and regular), Sweet Tea,  Candies, Chips, Cookies, Store Bought Juices, Alcohol  , Artificial Sweeteners,  Coffee Creamer, and Sugar-free Products, Lemonade. This will help patient to have more stable blood glucose profile and potentially avoid unintended weight gain.  - I have approached her with the following individualized plan to manage  her diabetes and patient agrees:    -She presents with continued engagement and improvement in her glycemic profile.  Based on her presentation with tightly controlled glycemic profile with point-of-care A1c of 5.6%, she will not need insulin  treatment for now.  She is advised to resume and continue Ozempic  2 mg subcutaneously weekly.   She continues to tolerate this medication.  Side effects and precautions discussed with her.   - she is not a candidate for Metformin, due to concurrent renal insufficiency. -She may requalify for low-dose SGLT2 inhibitor if needed on subsequent visits.  - Specific targets for  A1c;  LDL, HDL,  and Triglycerides were discussed with the patient.  2) Blood Pressure /Hypertension:  Her blood pressure is controlled to target. she is advised to continue her current medications including lisinopril 10 mg p.o. daily with breakfast .   3) Lipids/Hyperlipidemia:   Review of her  recent lipid panel showed uncontrolled LDL at 94.  She reports statin intolerance.    Whole food plant-based diet discussed and recommended above will help with dyslipidemia as well.  We considered for fasting lipid panel before her next visit.   4)  Weight/Diet:  Body mass index is 34.18 kg/m.     she is  a candidate for modest weight loss. I discussed with her the fact that loss of 5 - 10% of her  current body weight will have the most impact on her diabetes management.  Exercise, and detailed carbohydrates information provided  -  detailed on discharge instructions.  5) Chronic Care/Health Maintenance:  -she  is on ACEI  medications and  is encouraged to initiate and continue to  follow up with Ophthalmology, Dentist,  Podiatrist at least yearly or according to recommendations, and advised to   stay away from smoking. I have recommended yearly flu vaccine and pneumonia vaccine at least every 5 years; moderate intensity exercise for up to 150 minutes weekly; and  sleep for at least 7 hours a day.  She does not tolerate statins.  She will be considered for ezetimibe next visit.  6.  Vitamin D  deficiency: She is advised to continue cholecalciferol 2000 units daily.   She recently had normal ABI, will be repeated in 5 years.  - she is  advised to maintain close follow up with [name], MD for primary care needs, as well as her other providers for optimal and coordinated care.   I spent  26  minutes in the care of the patient today including review of labs from CMP, Lipids, Thyroid  Function, Hematology (current and previous including abstractions from other facilities); face-to-face time discussing  her blood glucose readings/logs, discussing hypoglycemia and hyperglycemia episodes and symptoms, medications doses, her options of short and long term treatment based on the latest standards of care / guidelines;  discussion about incorporating lifestyle medicine;  and documenting the encounter. Risk reduction counseling performed per USPSTF guidelines to reduce  obesity and cardiovascular risk factors.     Please refer to Patient Instructions for Blood Glucose Monitoring and Insulin /Medications Dosing Guide  in media tab for additional information. Please  also refer to  Patient Self Inventory in the Media  tab for reviewed elements of pertinent patient history.  Alisha NOVAK Coate participated in the discussions, expressed understanding, and voiced agreement with the above plans.  All questions were answered to her satisfaction. she is encouraged to contact clinic should she have any questions or concerns prior to her return visit.   Follow up plan: - Return in about 4  months (around 09/22/2024) for Bring Meter/CGM Device/Logs- A1c in Office.  Ranny Earl, MD [facility] [address] [place], [place] [address] Phone: [phone]  Fax: [phone]    05/23/2024, 12:36 PM  This note was partially dictated with voice recognition software. Similar sounding words can be transcribed inadequately or may not  be corrected upon review.

## 2024-05-26 DIAGNOSIS — Z299 Encounter for prophylactic measures, unspecified: Secondary | ICD-10-CM | POA: Diagnosis not present

## 2024-05-26 DIAGNOSIS — E1165 Type 2 diabetes mellitus with hyperglycemia: Secondary | ICD-10-CM | POA: Diagnosis not present

## 2024-05-26 DIAGNOSIS — I10 Essential (primary) hypertension: Secondary | ICD-10-CM | POA: Diagnosis not present

## 2024-05-29 DIAGNOSIS — N179 Acute kidney failure, unspecified: Secondary | ICD-10-CM | POA: Diagnosis not present

## 2024-05-29 DIAGNOSIS — D6959 Other secondary thrombocytopenia: Secondary | ICD-10-CM | POA: Diagnosis not present

## 2024-05-29 DIAGNOSIS — D638 Anemia in other chronic diseases classified elsewhere: Secondary | ICD-10-CM | POA: Diagnosis not present

## 2024-05-29 DIAGNOSIS — K7460 Unspecified cirrhosis of liver: Secondary | ICD-10-CM | POA: Diagnosis not present

## 2024-05-31 DIAGNOSIS — R222 Localized swelling, mass and lump, trunk: Secondary | ICD-10-CM | POA: Diagnosis not present

## 2024-05-31 DIAGNOSIS — C220 Liver cell carcinoma: Secondary | ICD-10-CM | POA: Diagnosis not present

## 2024-06-01 ENCOUNTER — Inpatient Hospital Stay

## 2024-06-02 ENCOUNTER — Inpatient Hospital Stay: Attending: Oncology

## 2024-06-02 DIAGNOSIS — D696 Thrombocytopenia, unspecified: Secondary | ICD-10-CM | POA: Insufficient documentation

## 2024-06-02 DIAGNOSIS — E538 Deficiency of other specified B group vitamins: Secondary | ICD-10-CM | POA: Insufficient documentation

## 2024-06-02 DIAGNOSIS — R059 Cough, unspecified: Secondary | ICD-10-CM | POA: Diagnosis not present

## 2024-06-02 DIAGNOSIS — L0390 Cellulitis, unspecified: Secondary | ICD-10-CM | POA: Diagnosis not present

## 2024-06-02 DIAGNOSIS — K319 Disease of stomach and duodenum, unspecified: Secondary | ICD-10-CM | POA: Insufficient documentation

## 2024-06-02 DIAGNOSIS — K909 Intestinal malabsorption, unspecified: Secondary | ICD-10-CM | POA: Insufficient documentation

## 2024-06-02 DIAGNOSIS — K7682 Hepatic encephalopathy: Secondary | ICD-10-CM | POA: Insufficient documentation

## 2024-06-02 DIAGNOSIS — E8770 Fluid overload, unspecified: Secondary | ICD-10-CM | POA: Diagnosis not present

## 2024-06-02 DIAGNOSIS — K7460 Unspecified cirrhosis of liver: Secondary | ICD-10-CM | POA: Diagnosis not present

## 2024-06-02 DIAGNOSIS — Z79899 Other long term (current) drug therapy: Secondary | ICD-10-CM | POA: Diagnosis not present

## 2024-06-02 DIAGNOSIS — D500 Iron deficiency anemia secondary to blood loss (chronic): Secondary | ICD-10-CM | POA: Diagnosis not present

## 2024-06-02 LAB — FERRITIN: Ferritin: 387 ng/mL — ABNORMAL HIGH (ref 11–307)

## 2024-06-02 LAB — IRON AND TIBC
Iron: 92 ug/dL (ref 28–170)
Saturation Ratios: 88 % — ABNORMAL HIGH (ref 10.4–31.8)
TIBC: 104 ug/dL — ABNORMAL LOW (ref 250–450)
UIBC: 12 ug/dL

## 2024-06-02 LAB — CBC WITH DIFFERENTIAL/PLATELET
Abs Immature Granulocytes: 0.01 K/uL (ref 0.00–0.07)
Basophils Absolute: 0 K/uL (ref 0.0–0.1)
Basophils Relative: 0 %
Eosinophils Absolute: 0.1 K/uL (ref 0.0–0.5)
Eosinophils Relative: 2 %
HCT: 26 % — ABNORMAL LOW (ref 36.0–46.0)
Hemoglobin: 8.9 g/dL — ABNORMAL LOW (ref 12.0–15.0)
Immature Granulocytes: 0 %
Lymphocytes Relative: 14 %
Lymphs Abs: 0.7 K/uL (ref 0.7–4.0)
MCH: 32.8 pg (ref 26.0–34.0)
MCHC: 34.2 g/dL (ref 30.0–36.0)
MCV: 95.9 fL (ref 80.0–100.0)
Monocytes Absolute: 0.5 K/uL (ref 0.1–1.0)
Monocytes Relative: 10 %
Neutro Abs: 3.8 K/uL (ref 1.7–7.7)
Neutrophils Relative %: 74 %
Platelets: 56 K/uL — ABNORMAL LOW (ref 150–400)
RBC: 2.71 MIL/uL — ABNORMAL LOW (ref 3.87–5.11)
RDW: 20.3 % — ABNORMAL HIGH (ref 11.5–15.5)
WBC: 5.1 K/uL (ref 4.0–10.5)
nRBC: 0 % (ref 0.0–0.2)

## 2024-06-02 LAB — COMPREHENSIVE METABOLIC PANEL WITH GFR
ALT: 17 U/L (ref 0–44)
AST: 40 U/L (ref 15–41)
Albumin: 2.7 g/dL — ABNORMAL LOW (ref 3.5–5.0)
Alkaline Phosphatase: 73 U/L (ref 38–126)
Anion gap: 12 (ref 5–15)
BUN: 37 mg/dL — ABNORMAL HIGH (ref 8–23)
CO2: 23 mmol/L (ref 22–32)
Calcium: 7.9 mg/dL — ABNORMAL LOW (ref 8.9–10.3)
Chloride: 97 mmol/L — ABNORMAL LOW (ref 98–111)
Creatinine, Ser: 2.65 mg/dL — ABNORMAL HIGH (ref 0.44–1.00)
GFR, Estimated: 18 mL/min — ABNORMAL LOW (ref >60)
Glucose, Bld: 203 mg/dL — ABNORMAL HIGH (ref 70–99)
Potassium: 3.6 mmol/L (ref 3.5–5.1)
Sodium: 132 mmol/L — ABNORMAL LOW (ref 135–145)
Total Bilirubin: 4.4 mg/dL — ABNORMAL HIGH (ref 0.0–1.2)
Total Protein: 5.7 g/dL — ABNORMAL LOW (ref 6.5–8.1)

## 2024-06-02 LAB — VITAMIN B12: Vitamin B-12: 669 pg/mL (ref 180–914)

## 2024-06-07 DIAGNOSIS — H35432 Paving stone degeneration of retina, left eye: Secondary | ICD-10-CM | POA: Diagnosis not present

## 2024-06-07 DIAGNOSIS — E113393 Type 2 diabetes mellitus with moderate nonproliferative diabetic retinopathy without macular edema, bilateral: Secondary | ICD-10-CM | POA: Diagnosis not present

## 2024-06-07 DIAGNOSIS — H43813 Vitreous degeneration, bilateral: Secondary | ICD-10-CM | POA: Diagnosis not present

## 2024-06-07 LAB — METHYLMALONIC ACID, SERUM: Methylmalonic Acid, Quantitative: 258 nmol/L (ref 0–378)

## 2024-06-08 ENCOUNTER — Other Ambulatory Visit: Payer: Self-pay | Admitting: "Endocrinology

## 2024-06-08 ENCOUNTER — Inpatient Hospital Stay: Admitting: Oncology

## 2024-06-08 VITALS — BP 100/50 | HR 71 | Temp 97.5°F | Resp 20 | Wt 170.0 lb

## 2024-06-08 DIAGNOSIS — E538 Deficiency of other specified B group vitamins: Secondary | ICD-10-CM | POA: Diagnosis not present

## 2024-06-08 DIAGNOSIS — K909 Intestinal malabsorption, unspecified: Secondary | ICD-10-CM | POA: Diagnosis not present

## 2024-06-08 DIAGNOSIS — K7460 Unspecified cirrhosis of liver: Secondary | ICD-10-CM | POA: Diagnosis not present

## 2024-06-08 DIAGNOSIS — K319 Disease of stomach and duodenum, unspecified: Secondary | ICD-10-CM | POA: Diagnosis not present

## 2024-06-08 DIAGNOSIS — D500 Iron deficiency anemia secondary to blood loss (chronic): Secondary | ICD-10-CM | POA: Diagnosis not present

## 2024-06-08 DIAGNOSIS — Z79899 Other long term (current) drug therapy: Secondary | ICD-10-CM | POA: Diagnosis not present

## 2024-06-08 DIAGNOSIS — D696 Thrombocytopenia, unspecified: Secondary | ICD-10-CM

## 2024-06-08 DIAGNOSIS — E8770 Fluid overload, unspecified: Secondary | ICD-10-CM | POA: Diagnosis not present

## 2024-06-08 DIAGNOSIS — L0390 Cellulitis, unspecified: Secondary | ICD-10-CM | POA: Diagnosis not present

## 2024-06-08 DIAGNOSIS — C220 Liver cell carcinoma: Secondary | ICD-10-CM | POA: Diagnosis not present

## 2024-06-08 DIAGNOSIS — E1169 Type 2 diabetes mellitus with other specified complication: Secondary | ICD-10-CM

## 2024-06-08 NOTE — Assessment & Plan Note (Addendum)
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  Has required multiple banding procedures.  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 -She is currently not on oral iron supplements. -Has required intermittent IV Feraheme  last given in September 2021. -Most recent labs from 06/02/2024 shows a hemoglobin of 8.9, hematocrit 26.0 and platelet count of 56,000. -Reports she received a unit of blood while in the hospital.  She had several paracentesis and was given fluid along with many diuretics. -Overall, she feels well.  Denies any significant fatigue or weakness.  We discussed monitoring labs for now to see if they return to normal following her hospital stay. -She has slightly worsening kidney function but she is followed Dr. Rachele who is monitoring this.  We discussed if this continues and her hemoglobin continues to decline we could potentially start her on EPO like Retacrit 10,000 units to help stimulate her red blood cells. -Return to clinic in 8 weeks with labs and further discussion.

## 2024-06-08 NOTE — Progress Notes (Signed)
 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  Has required multiple banding procedures.  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 -She is currently not on oral iron supplements. -Has required intermittent IV Feraheme  last given in September 2021. -Most recent labs from 06/02/2024 shows a hemoglobin of 8.9, hematocrit 26.0 and platelet count of 56,000. -Reports she received a unit of blood while in the hospital.  She had several paracentesis and was given fluid along with many diuretics. -Overall, she feels well.  Denies any significant fatigue or weakness.  We discussed monitoring labs for now to see if they return to normal following her hospital stay. -She has slightly worsening kidney function but she is followed Dr. Rachele who is monitoring this.  We discussed if this continues and her hemoglobin continues to decline we could potentially start her on EPO like Retacrit 10,000 units to help stimulate her red blood cells. -Return to clinic in 8 weeks with labs and further discussion. B12 deficiency B12 level is 669 within normal MMA. Monitor. Thrombocytopenia Etiology secondary to underlying cirrhosis. Platelets are chronically between 50 and 75 Most recent lab drawl from 06/02/2024 shows a platelet count of 56,000. No bleeding per patient.  Continue to monitor.  Orders Placed This Encounter  Procedures   CBC with Differential    Standing Status:   Future    Expected Date:   08/06/2024    Expiration Date:   11/04/2024   Comprehensive metabolic panel    Standing Status:   Future    Expected Date:   08/06/2024    Expiration Date:   11/04/2024   Ferritin    Standing Status:   Future    Expected Date:   08/06/2024    Expiration Date:    11/04/2024   Vitamin B12    Standing Status:   Future    Expected Date:   08/06/2024    Expiration Date:   11/04/2024   Iron and TIBC ([facility] ONLY)    Standing Status:   Future    Expected Date:   08/06/2024    Expiration Date:   11/04/2024   Methylmalonic acid, serum    Standing Status:   Future    Expected Date:   08/06/2024    Expiration Date:   11/04/2024    INTERVAL HISTORY: Patient returns for follow-up.  Patient was recently hospitalized at [facility] for decompensated liver cirrhosis, volume overload and right foot cellulitis.  She was started on antibiotics for cellulitis, Lasix for volume overload and lactulose for hepatic encephalopathy.  Lisinopril was held for AKI and she was started on spironolactone 25 mg per hepatology.  Reports overall doing well since her hospitalization.  She denies any bright red blood per rectum, melena or hematochezia.  She is concerned about her platelets as they have significantly dropped since her hospital stay.  Reports she is followed by Dr. Rachele and sees him soon.  Reports a chronic but stable cough.  Overall appetite is fair energy levels are 75%.  We reviewed cbc, cmp, ferritin, b12, iron panel and MMA.  SUMMARY OF HEMATOLOGIC HISTORY: Oncology History   No history exists.     CBC    Component Value Date/Time   WBC 5.1 06/02/2024 1253  RBC 2.71 (L) 06/02/2024 1253   HGB 8.9 (L) 06/02/2024 1253   HCT 26.0 (L) 06/02/2024 1253   PLT 56 (L) 06/02/2024 1253   MCV 95.9 06/02/2024 1253   MCH 32.8 06/02/2024 1253   MCHC 34.2 06/02/2024 1253   RDW 20.3 (H) 06/02/2024 1253   LYMPHSABS 0.7 06/02/2024 1253   MONOABS 0.5 06/02/2024 1253   EOSABS 0.1 06/02/2024 1253   BASOSABS 0.0 06/02/2024 1253       Latest Ref Rng & Units 06/02/2024   12:53 PM 01/17/2024   11:32 AM 12/02/2023    9:44 AM  CMP  Glucose 70 - 99 mg/dL 796  86  838   BUN 8 - 23 mg/dL 37  14  16   Creatinine 0.44 - 1.00 mg/dL 7.34  8.37  8.29    Sodium 135 - 145 mmol/L 132  133  134   Potassium 3.5 - 5.1 mmol/L 3.6  4.5  4.2   Chloride 98 - 111 mmol/L 97  103  104   CO2 22 - 32 mmol/L 23  23  22    Calcium 8.9 - 10.3 mg/dL 7.9  8.9  8.7   Total Protein 6.5 - 8.1 g/dL 5.7  6.0  6.1   Total Bilirubin 0.0 - 1.2 mg/dL 4.4  2.8  2.5   Alkaline Phos 38 - 126 U/L 73  61  60   AST 15 - 41 U/L 40  29  28   ALT 0 - 44 U/L 17  12  11       Lab Results  Component Value Date   FERRITIN 387 (H) 06/02/2024   VITAMINB12 669 06/02/2024    Vitals:   06/08/24 1314  BP: (!) 100/50  Pulse: 71  Resp: 20  Temp: (!) 97.5 F (36.4 C)  SpO2: 100%    Review of System:  Review of Systems  Constitutional:  Negative for malaise/fatigue.  Respiratory:  Positive for cough.   Gastrointestinal:  Negative for abdominal pain, blood in stool, constipation and diarrhea.  Neurological:  Negative for dizziness and headaches.  Psychiatric/Behavioral:  The patient is not nervous/anxious.     Physical Exam: Physical Exam Constitutional:      Appearance: Normal appearance.  HENT:     Head: Normocephalic and atraumatic.  Eyes:     Pupils: Pupils are equal, round, and reactive to light.  Cardiovascular:     Rate and Rhythm: Normal rate and regular rhythm.     Heart sounds: Normal heart sounds. No murmur heard. Pulmonary:     Effort: Pulmonary effort is normal.     Breath sounds: Normal breath sounds. No wheezing.  Abdominal:     General: Bowel sounds are normal. There is no distension.     Palpations: Abdomen is soft.     Tenderness: There is no abdominal tenderness.  Musculoskeletal:        General: Normal range of motion.     Cervical back: Normal range of motion.  Skin:    General: Skin is warm and dry.     Findings: No rash.  Neurological:     Mental Status: She is alert and oriented to person, place, and time.     Gait: Gait is intact.  Psychiatric:        Mood and Affect: Mood and affect normal.        Cognition and Memory: Memory  normal.        Judgment: Judgment normal.      I  spent 20 minutes dedicated to the care of this patient (face-to-face and non-face-to-face) on the date of the encounter to include what is described in the assessment and plan.,  Delon Hope, NP 06/11/2024 12:08 PM

## 2024-06-09 ENCOUNTER — Other Ambulatory Visit: Payer: Self-pay | Admitting: "Endocrinology

## 2024-06-11 NOTE — Assessment & Plan Note (Signed)
 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 Platelets are chronically between 50 and 75 Most recent lab drawl from 06/02/2024 shows a platelet count of 56,000. No bleeding per patient.  Continue to monitor.

## 2024-06-11 NOTE — Assessment & Plan Note (Signed)
 B12 level is 669 within normal MMA. Monitor.

## 2024-06-26 ENCOUNTER — Ambulatory Visit: Admitting: Gastroenterology

## 2024-06-26 DIAGNOSIS — R4182 Altered mental status, unspecified: Secondary | ICD-10-CM | POA: Diagnosis not present

## 2024-06-26 DIAGNOSIS — K7682 Hepatic encephalopathy: Secondary | ICD-10-CM | POA: Diagnosis not present

## 2024-06-26 DIAGNOSIS — R531 Weakness: Secondary | ICD-10-CM | POA: Diagnosis not present

## 2024-06-26 DIAGNOSIS — N184 Chronic kidney disease, stage 4 (severe): Secondary | ICD-10-CM | POA: Diagnosis not present

## 2024-06-26 DIAGNOSIS — R0989 Other specified symptoms and signs involving the circulatory and respiratory systems: Secondary | ICD-10-CM | POA: Diagnosis not present

## 2024-06-26 DIAGNOSIS — K7460 Unspecified cirrhosis of liver: Secondary | ICD-10-CM | POA: Diagnosis not present

## 2024-06-26 DIAGNOSIS — D631 Anemia in chronic kidney disease: Secondary | ICD-10-CM | POA: Diagnosis not present

## 2024-06-26 DIAGNOSIS — I509 Heart failure, unspecified: Secondary | ICD-10-CM | POA: Diagnosis not present

## 2024-06-26 DIAGNOSIS — Z79899 Other long term (current) drug therapy: Secondary | ICD-10-CM | POA: Diagnosis not present

## 2024-06-26 DIAGNOSIS — G9389 Other specified disorders of brain: Secondary | ICD-10-CM | POA: Diagnosis not present

## 2024-06-26 DIAGNOSIS — Z7901 Long term (current) use of anticoagulants: Secondary | ICD-10-CM | POA: Diagnosis not present

## 2024-06-26 DIAGNOSIS — E1122 Type 2 diabetes mellitus with diabetic chronic kidney disease: Secondary | ICD-10-CM | POA: Diagnosis not present

## 2024-06-26 DIAGNOSIS — R918 Other nonspecific abnormal finding of lung field: Secondary | ICD-10-CM | POA: Diagnosis not present

## 2024-06-26 DIAGNOSIS — D500 Iron deficiency anemia secondary to blood loss (chronic): Secondary | ICD-10-CM | POA: Diagnosis not present

## 2024-06-26 DIAGNOSIS — R911 Solitary pulmonary nodule: Secondary | ICD-10-CM | POA: Diagnosis not present

## 2024-06-26 DIAGNOSIS — F4489 Other dissociative and conversion disorders: Secondary | ICD-10-CM | POA: Diagnosis not present

## 2024-06-26 DIAGNOSIS — D696 Thrombocytopenia, unspecified: Secondary | ICD-10-CM | POA: Diagnosis not present

## 2024-06-26 DIAGNOSIS — I6523 Occlusion and stenosis of bilateral carotid arteries: Secondary | ICD-10-CM | POA: Diagnosis not present

## 2024-06-26 DIAGNOSIS — K7200 Acute and subacute hepatic failure without coma: Secondary | ICD-10-CM | POA: Diagnosis not present

## 2024-06-30 DIAGNOSIS — R911 Solitary pulmonary nodule: Secondary | ICD-10-CM | POA: Diagnosis not present

## 2024-06-30 DIAGNOSIS — R772 Abnormality of alphafetoprotein: Secondary | ICD-10-CM | POA: Diagnosis not present

## 2024-06-30 DIAGNOSIS — K7460 Unspecified cirrhosis of liver: Secondary | ICD-10-CM | POA: Diagnosis not present

## 2024-06-30 DIAGNOSIS — K7689 Other specified diseases of liver: Secondary | ICD-10-CM | POA: Diagnosis not present

## 2024-06-30 DIAGNOSIS — R188 Other ascites: Secondary | ICD-10-CM | POA: Diagnosis not present

## 2024-07-06 DIAGNOSIS — R918 Other nonspecific abnormal finding of lung field: Secondary | ICD-10-CM | POA: Diagnosis not present

## 2024-07-06 DIAGNOSIS — Z8505 Personal history of malignant neoplasm of liver: Secondary | ICD-10-CM | POA: Diagnosis not present

## 2024-07-06 NOTE — Progress Notes (Signed)
 This video encounter was conducted with the patient's (or proxy's) verbal consent via secure, interactive audio and video telecommunications.    The patient (or proxy) was instructed by the virtual care center staff to have this encounter in a suitably private space and to only have persons present to whom they give permission to participate. In addition, patient identity was confirmed by use of name and date of birth.   Telehealth visit was conducted with Almarie Later (spouse) via HIPAA compliant video platform.  Patient educated on the ability to self-schedule follow-up labs and imaging and agrees to schedule their own follow-up labs and imaging. Patient understands to schedule as soon as possible.  Patient understands to MyChart message or call for assistance, if needed.

## 2024-07-06 NOTE — Progress Notes (Signed)
 Liver Clinic - Return Video Visit  Primary Care Provider Alisha [name], MD  [address] [name] [address]     Chief complaint: Chief Complaint  Patient presents with  . Hepatocellular carcinoma    History of Present Illness:  [name] is a 73 y.o. female who presents for a return visit for management of cirrhosis HCC in setting of elevated AFP.   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  She was evaluated for liver transplantation at our [name] in 2018 and not added to wait list as patient was too well for transplant.  She has been followed more closely recently due to rising AFP level with stable LR-4 lesion seen on imaging. Her 10/2019 MRI was discussed at [name] conference and lesion not felt in a location amenable for biopsy.  She ultimately underwent a IVC guided biopsy on June 06, 2020 was negative for malignancy, but did not demonstrate findings consistent with the segment 7 lesion, raising concern that mass was not sampled.  She has been further evaluated for a RML consolidative mass noted to be enlarging on CT scan done 08/24/2020.  PET CT scan showed minimal FDG avid RML consolidative opacity which is progressively increased in size compared to 2011 remains worrisome for malignancy.  She underwent a CT-guided biopsy 11/09/2020.  Pathology showed benign lung parenchyma but did not explain the opacity seen on imaging.  Of the options reviewed with her, she has opted for short interval surveillance with imaging.  July 2022 MRI with LR 5 mass and rising AFP. She is a marginal transplant candidate due to her comorbidities, uncontrolled T2DM and a lung mass (currently under surveillance). She underwent Y90 radioembolization on September 01, 2021. MRI has demonstrated LR-TR nonprogressing tumor, though rising AFP is concerning for HCC.  MRI done December 08, 2023 showed LR-TR nonviable in segment 7, LR-4  lesion in segment 6. AFP 2483. MRI was reviewed at [name] on 12/13/2023. Recommended Chest CT with rising AFP and h/o chest mass.  CT chest 01/10/2024 wit increasing mass in RML. Underwent bronchoscopy in June 2025 which was nondiagnostic. MRI 05/05/2024 with LR-4 lesion, CT with slight decrease in RML soft tissue mass. AFP tumor marker has increased from 1,095 in September 2024 to 4,830 in August 2025.   Interval Events: Last seen in clinic 05/31/2024.Today, she presents to clinic with her husband.   Since last appointment she was hospitalized locally 06/26/2024-06/28/2024 at [name] for AMS which improved on lactulose.   At last appointment shew as referred to medical oncology at [name], but she preferred to see local oncology, so referral was placed in ED and she was seen by Dr. Letty on 06/30/2024.  History of Present Illness She was recently hospitalized due to confusion, attributed to elevated ammonia levels. She is currently taking lactulose at home to manage her ammonia levels, with a dosage of 30 mL at 9 AM and 9 PM. She has a couple of bowel movements per day, but the goal is to have three to four bowel movements daily to help eliminate ammonia from her bloodstream. She has not experienced confusion since leaving the [name].  She is also taking Lasix, three 20 mg tablets per day, and spironolactone, one  25 mg tablet per day, to manage her swelling, which she reports is better. Her weight is stable at around 164 pounds, similar to her last clinic visit. She adheres to a low-salt diet as part  of her management plan.  She is under the care of a local oncologist for a lung spot, with a follow-up appointment scheduled for November 20th. A CT chest has been ordered but not scheduled.  No blood in stool or black stools.   She received her flu shot through a church drive.   Health Maintenance Variceal surveillance: July 2023 EGD with banding with Dr. Shaaron. On Propranolol . CRC screening:  July 2023 no polyps removed with Dr. Shaaron  [name] surveillance: MRI 04/2024 with LR-4 lesion. Repeat 07/2024. Hepatitis A serology reactive Hepatitis B vaccinated Influenza: Received Fall 2025 Pneumonia: Has received her pneumovax 2008, Prevnar 03/21/2019  General Review of Systems:  General ROS: negative for - chills, fever, night sweats and weight loss Psychological ROS: negative for - anxiety and depression Allergy and Immunology ROS: negative for - severe allergic reactions Hematological and Lymphatic ROS: negative for - bleeding problems and swollen lymph nodes Endocrine ROS: negative for - skin changes and temperature intolerance Respiratory ROS: negative for - cough and shortness of breath Cardiovascular ROS: negative for - chest pain and palpitations Musculoskeletal ROS: negative for - joint pain and joint swelling Neurological ROS: negative for - gait disturbance and headaches Dermatological ROS: negative for rash  Past Medical History: Past Medical History:  Diagnosis Date  . Barretts esophagus 08/30/2009   Qualifier: Diagnosis of   By: Burnis Pulling    . Central obesity   . Chronic anemia 05/11/2024  . Cirrhosis (CMS/HHS-HCC)   . Diabetes mellitus type 2 in obese (CMS/HHS-HCC)   . Esophageal reflux 08/30/2009   Qualifier: Diagnosis of   By: Ezzard [name]    . Hepatocellular carcinoma (CMS/HHS-HCC) 07/01/2021  . Hyperlipidemia associated with type 2 diabetes mellitus (CMS/HHS-HCC)   . Hypertension   . Iron deficiency   . Mass of middle lobe of right lung 05/11/2024  . NASH (nonalcoholic steatohepatitis)   . Opacity of lung on imaging study 11/07/2020  . Portal hypertension with esophageal varices (CMS/HHS-HCC) 05/11/2024  . Statin intolerance 04/14/2022  . Thrombocytopenia () 12/19/2016    Past Surgical History: Past Surgical History:  Procedure Laterality Date  . HEMORRHOID SURGERY  2008  . COLONOSCOPY  03/2017   multiple  . POLYPECTOMY  2023  . ROBOT  ASSIST BRONCHOSCOPY Bilateral 02/16/2024   Procedure: ROBOT ASSISTED BRONCHOSCOPY,RIGID OR FLEXIBLE,INCLUDING FLUOROSCOPIC GUIDANCE,WHEN PERFORMED; WITH COMPUTER-ASSISTED,IMAGE-GUIDED NAVIGATION (LIST IN ADDITION TO PRIMARY PROCEDURE);  Surgeon: Pearlene Sharper, MD;  Location[facility];  Service: Cardiothoracic;  Laterality: Bilateral;  . BRONCHOSCOPY  02/16/2024   Procedure: BRONCHOSCOPY, RIGID OR FLEXIBLE, INCL FLUORO GUIDE; W/ EBUS DURING [name] DIAGNOSTIC/THERAPEUTIC INTERVENTION FOR PERIPHERAL LESION (LIST SEPARATELY IN ADDITION TO CODE FOR PRIMARY PROCEDURE);  Surgeon: Pearlene Sharper, MD;  Location: [facility];  Service: Cardiothoracic;;  . BRONCHOSCOPY W/TRANSBRONCHIAL BIOPSY FLEXIBLE N/A 02/16/2024   Procedure: BRONCHOSCOPY, RIGID OR FLEXIBLE, INCLUDING FLUOROSCOPIC GUIDANCE, WHEN PERFORMED; WITH TRANSBRONCHIAL NEEDLE ASPIRATION BIOPSY(S), TRACHEA, MAIN STEM AND/OR LOBAR BRONCHUS(I);  Surgeon: Pearlene Sharper, MD;  Location: [facility];  Service: Cardiothoracic;  Laterality: N/A;  . BRONCHOSCOPY N/A 02/16/2024   Procedure: BRONCHOSCOPY, RIGID OR FLEXIBLE, INCLUDING FLUOROSCOPIC GUIDANCE, WHEN PERFORMED; WITH BRONCHIAL ALVEOLAR LAVAGE;  Surgeon: Pearlene Sharper, MD;  Location: [facility];  Service: Cardiothoracic;  Laterality: N/A;  . CESAREAN DELIVERY    . EGD     with banding; multiple  . HYSTERECTOMY VAGINAL      Allergies: Codeine  Medications:  Current Outpatient Medications  Medication Sig Dispense Refill  . blood-glucose meter,continuous (FREESTYLE LIBRE 3 READER) Misc     . blood-glucose sensor (FREESTYLE LIBRE 3 SENSOR) 1 each    . FUROsemide (LASIX) 20 MG tablet Take 3 tablets (60 mg total) by mouth once daily 90 tablet 4  . lactulose (ENULOSE) 10 gram/15 mL oral solution Take 30 mLs by mouth 2 (two) times daily for 10 days 600 mL 0  . OZEMPIC  2 mg/dose (8  mg/3 mL) pen injector Inject 2 mg subcutaneously once a week    . pantoprazole  (PROTONIX ) 40 MG DR tablet Take 40 mg by mouth 2 (two) times daily    . propranolol  (INDERAL ) 10 MG tablet Take 30 mg by mouth 2 (two) times daily  11  . spironolactone (ALDACTONE) 25 MG tablet Take 1 tablet (25 mg total) by mouth once daily 90 tablet 0  . cholecalciferol (VITAMIN D3) 2,000 unit tablet Take 2,000 Units by mouth once daily (Patient not taking: Reported on 07/06/2024)    . [Paused] lisinopril (PRINIVIL,ZESTRIL) 10 MG tablet Take 10 mg by mouth once daily (Patient not taking: Reported on 07/06/2024)     No current facility-administered medications for this visit.    Social History: Social History   Socioeconomic History  . Marital status: Married  Tobacco Use  . Smoking status: Never    Passive exposure: Past  . Smokeless tobacco: Never  Vaping Use  . Vaping status: Never Used  Substance and Sexual Activity  . Alcohol  use: Never  . Drug use: Never  . 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 How hard is it for you to pay for the very basics like food, housing, medical care, and heating?: Not very hard  Food Insecurity: No Food Insecurity (06/30/2024)   Received from [name]   Hunger Vital Sign   . Within the past 12 months, you worried that your food would run out before you got the money to buy more.: Never true   . Within the past 12 months, the food you bought just didn't last and you didn't have money to get more.: Never true  Transportation Needs: No Transportation Needs (06/30/2024)   Received from [name] - Transportation   . Lack of  Transportation (Medical): No   . Lack of Transportation (Non-Medical): No  Physical Activity: Inactive (06/26/2024)   Received from [name]   Exercise Vital Sign   . On average, how many days per week do you engage in moderate to strenuous exercise (like a brisk walk)?: 0 days   . On average, how many minutes do you engage in exercise at this level?: 0 min  Stress: Patient Unable To Answer (06/26/2024)   Received from [name] - Occupational Stress Questionnaire   . Do you feel stress - tense, restless, nervous, or anxious, or unable to sleep at night because your mind is troubled all the time - these days?: Patient unable to answer  Social Connections: Socially Integrated (06/26/2024)   Received from [name]   Social Connection and Isolation Panel   .  In a typical week, how many times do you talk on the phone with family, friends, or neighbors?: More than three times a week   . How often do you get together with friends or relatives?: Three times a week   . How often do you attend church or religious services?: More than 4 times per year   . Do you belong to any clubs or organizations such as church groups, unions, fraternal or athletic groups, or school groups?: No   . How often do you attend meetings of the clubs or organizations you belong to?: 1 to 4 times per year   . Are you married, widowed, divorced, separated, never married, or living with a partner?: Married  Housing Stability: Low Risk  (06/30/2024)   Received from [facility]   . Within the past 12 months, have you ever stayed: outside, in a car, in a tent, in an overnight shelter, or temporarily in someone else's home(i.e.couch-surfing)?: No   . Are you worried about losing your housing?: No    Family History: Family History  Problem Relation Age of Onset  . Alzheimer's disease Mother   . Alzheimer's disease Father   . Coronary Artery Disease  (Blocked arteries around heart) Father   . Diabetes Sister   . Diabetes Sister   . Diabetes Sister   . Diabetes Brother   . Diabetes Brother   . Diabetes Brother   . Liver disease Neg Hx   . HCC Neg Hx   . Colorectal neoplasia Neg Hx   . Anesthesia problems Neg Hx     Physical Examination: Vitals:   07/06/24 0931  BP: 111/61  Pulse: 86  Temp: 36.5 C (97.7 F)     Body mass index is 32.03 kg/m. Weight: 74.4 kg (164 lb)    General:  The patient is in no acute distress.    VIDEO VISIT   Labs and Studies: Recent Labs    05/13/24 0558 05/14/24 0501 05/15/24 0249 05/16/24 0459 05/17/24 0623  K 4.3 4.6 4.5 4.0 3.9  CL 106 108 108 105 105  CO2 20* 19* 20* 19* 20*  BUN 20 21* 19 21* 21*  CREATININE 2.6* 2.8* 2.8* 2.5* 2.1*  GLUCOSE 130 127 158* 157* 155*  CALCIUM 9.0 9.0 9.0 9.2 9.3  AST 27 26 27 30 27   ALT 11 10 11 11 11   TBILI 4.2* 3.4* 6.6* 4.1* 3.5*  ALKPHOS 34 32 34 36 35  ALB 2.7* 2.5* 2.7* 2.6* 2.6*  TOTALPROTEIN 5.3* 5.3* 5.7* 5.7* 5.4*   Recent Labs    05/13/24 0558 05/14/24 0501 05/14/24 1547 05/15/24 1040 05/16/24 0459 05/17/24 0623  WBC 3.3 2.3*  --  3.9 4.4 2.9*  HGB 7.3* 6.9* 9.8* 9.1* 8.8* 7.9*  HCT 21.6* 21.2* 29.9* 27.3* 26.0* 23.6*  PLT 46* 39*  --  37* 37* 26*   Recent Labs    05/13/24 0558 05/15/24 0249 05/16/24 0459 05/17/24 0623 06/08/24 1416  INR 1.8* 1.8* 1.8* 1.9* 1.5*   Recent Labs    05/14/24 0501  IRON 88  TIBC <98*  FERRITIN 259*      05/05/2024 MRI Abdomen Impression:   Hepatic findings: 1.  Cirrhotic liver morphology with sequela of portal hypertension. 2.  Observation 1: Posttreatment changes to hepatic segment 7 without suspicious arterial hyperenhancement or washout. LR-TR nonviable. 3.  Observation 2: Stable size of arterially hyperenhancing  lesion in segment 6. This lesion was previously characterized as LR-4 based  on the presence of diffusion 4. Observation 3: 1.1 cm hyperenhancing lesion  segment 6 (series 15 image 27) without clear washout or capsule, LR-3     Extrahepatic findings: Similar right middle lobe consolidation.   MELD 3.0: 29 at 05/17/2024  6:23 AM MELD-Na: 26 at 05/17/2024  6:23 AM Calculated from: Serum Creatinine: 2.1 mg/dL at 0/02/7973  3:76 AM Serum Sodium: 134 mmol/L at 05/17/2024  6:23 AM Total Bilirubin: 3.5 mg/dL at 0/02/7973  3:76 AM Serum Albumin: 2.6 g/dL at 0/02/7973  3:76 AM INR(ratio): 1.9 at 05/17/2024  6:23 AM Age at listing (hypothetical): 11 years Sex: Female at 05/17/2024  6:23 AM  Assessment & Plan: [name] is a 73 y.o. 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   Encounter Diagnosis  Name Primary?  . History of hepatocellular carcinoma Yes    [place] was evaluated for liver transplantation at our [name] in November 2018 and ultimately not added to the list given low MELD score and poor control of her diabetes. HgbA1c improved after starting ozempic  and working with endocrinology, however, lung nodule concerning for malignancy. For these reasons, she has not been deemed candidate for OLT.  She decompensated in August 2025 with ascites and LE edema requiring hospitalization. She has CKD, followed by local nephrology. She has preferred as much care locally as possible. She was referred to [name] for her history of HCC, LR-4 lesion, rising AFP and concerning pulmonary nodule. She did not present to the appointment with Dr. Markham. She was then hospitalized locally for AMS related to HE (improved with lactulose). During this admission she was referred to a local oncologist, Dr. Letty whom she saw last week. She is scheduling for CT chest locally.   HE: Improved on lactulose. Denies confusion since [name] discharge - Continue lactulose. We discussed appropriate way to titrate lactulose for 3-4 BM/day - Contact me with  worsening confusion, would add rifaximin  LE Edema: - Reports this has resolved - Continue current diuretics, appreciate assistance from local nephrologist. Diuresis limited by CKD  HCC: She has an LR 5 mass and was recommended for locoregional treatment. She underwent Y90 radioembolization in December 2022. MRI done December 08, 2023 showed LR-TR nonviable in segment 7, LR-4 lesion in segment 6. AFP 2483. MRI was reviewed at [name] on 12/13/2023. Recommended Chest CT with rising AFP and h/o chest mass as below. AFP now >4000. MRI 05/05/2024 with stable LR-4 lesion segment 6.  - Imaging due 07/2024, referral placed  Lung Nodule: minimal FDG activity in RML nodule on 09/2020 PET CT. CT-guided biopsy with negative pathology February 2022. CT chest 01/10/2024 wit increasing mass in RML, underwent bronchoscopy 02/16/2024 which was nondiagnostic. Planning to repeat CT chest 3 months (scheduled 05/05/2024). Malignancy is suspected, and after reviewing with oncology, a definitive diagnosis is needed to move forward with systemic therapy. Given rising AFP and presence of lung nodule, we have recommended that she see medical oncology for further guidance. She declined this at her previous appointment given her decompensated state. We reviewed that it was preferable to stay at [name] given her imaging, pathology has been completed at [name], but she did not present for medical oncology appointment with Dr. Markham. She is now followed by local oncologist at [name] and prefers local follow up. - Appreciate recommendations from local medical oncology  Renal Dysfunction: Followed by local nephrologist  Variceal Screening:  -  EGD completed with Dr. Shaaron in 04/01/2022. On NSBB  T2DM: HgbA1c controlled - Continue close follow up with Dr. Lenis, local endocrinology, on Ozempic    Health Maintenance: - Prevnar 03/21/2019, Pneumovax 07/2019 - HAV reactive - HBV vaccinated   RTC: 1 month in person with MRI  This note has been created  using automated tools and reviewed for accuracy by [name] HOLBROO[facility] DAWKINS.      Lauraine Ladora Lento, FNP-C Gastroenterology and Transplant Hepatology [phone]    Attestation Statement:   I personally performed the service, non-incident to. [name])   SARAH LADORA DAWKINS, NP  This video encounter was conducted with the patient's (or proxy's) verbal consent via secure, interactive audio and video telecommunications while away from clinic/office/[name].  The patient (or proxy) was instructed to have this encounter in a suitably private space and to only have persons present to whom they give permission to participate. In addition, patient identity was confirmed by use of name plus an additional identifier.  This visit was coded based on medical decision making (MDM).

## 2024-07-07 DIAGNOSIS — K7682 Hepatic encephalopathy: Secondary | ICD-10-CM | POA: Diagnosis not present

## 2024-07-07 DIAGNOSIS — N1832 Chronic kidney disease, stage 3b: Secondary | ICD-10-CM | POA: Diagnosis not present

## 2024-07-07 DIAGNOSIS — D649 Anemia, unspecified: Secondary | ICD-10-CM | POA: Diagnosis not present

## 2024-07-12 ENCOUNTER — Encounter: Payer: Self-pay | Admitting: Gastroenterology

## 2024-07-12 ENCOUNTER — Ambulatory Visit: Admitting: Gastroenterology

## 2024-07-12 VITALS — BP 115/51 | HR 96 | Temp 97.6°F | Ht 60.0 in | Wt 166.0 lb

## 2024-07-12 DIAGNOSIS — R918 Other nonspecific abnormal finding of lung field: Secondary | ICD-10-CM

## 2024-07-12 DIAGNOSIS — R600 Localized edema: Secondary | ICD-10-CM

## 2024-07-12 DIAGNOSIS — K7682 Hepatic encephalopathy: Secondary | ICD-10-CM

## 2024-07-12 DIAGNOSIS — C220 Liver cell carcinoma: Secondary | ICD-10-CM | POA: Diagnosis not present

## 2024-07-12 DIAGNOSIS — K7460 Unspecified cirrhosis of liver: Secondary | ICD-10-CM | POA: Diagnosis not present

## 2024-07-12 DIAGNOSIS — K2270 Barrett's esophagus without dysplasia: Secondary | ICD-10-CM

## 2024-07-12 DIAGNOSIS — R188 Other ascites: Secondary | ICD-10-CM | POA: Diagnosis not present

## 2024-07-12 DIAGNOSIS — K219 Gastro-esophageal reflux disease without esophagitis: Secondary | ICD-10-CM

## 2024-07-12 DIAGNOSIS — I8510 Secondary esophageal varices without bleeding: Secondary | ICD-10-CM

## 2024-07-12 MED ORDER — FUROSEMIDE 20 MG PO TABS: 60.0000 mg | ORAL_TABLET | Freq: Every day | ORAL | 3 refills | Status: DC

## 2024-07-12 MED ORDER — SPIRONOLACTONE 25 MG PO TABS: 25.0000 mg | ORAL_TABLET | Freq: Every day | ORAL | 3 refills | Status: DC

## 2024-07-12 MED ORDER — SPIRONOLACTONE 25 MG PO TABS: 25.0000 mg | ORAL_TABLET | Freq: Every day | ORAL | 3 refills | Status: AC

## 2024-07-12 MED ORDER — FUROSEMIDE 20 MG PO TABS: 60.0000 mg | ORAL_TABLET | Freq: Every day | ORAL | 3 refills | Status: AC

## 2024-07-12 NOTE — Progress Notes (Addendum)
 GI Office Note    Referring Provider: [name], MD Primary Care Physician:  Alisha [name], MD Primary Gastroenterologist: Alisha HERO.Rourk, MD  Date:  07/12/2024  ID:  Alisha Alisha [name], DOB [birth year], MRN [id number]   Chief Complaint   Chief Complaint  Patient presents with   Follow-up    Hospital follow up   History of Present Illness  Alisha Alisha [name] a 73 y.o. 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  Reflux well-controlled on pantoprazole  40 mg daily and occasionally occasionally needing this for breakthrough.  Denied any dysphagia.  Noted to not be a liver transplant candidate.  Liver disease well compensated.  Maintain on NSBB.  Advise repeat EGD for surveillance of varices.  Did not recommend any further upper GI biopsies given underlying portal hypertension.  No change in medications.  Will schedule for EGD.   EGD July 2023: -4 columns of grade 2 gastric varices s/p banding -1 column of underlying tongues of Barrett's esophagus recently -Portal hypertensive gastropathy -Small hiatal hernia   Colonoscopy July 2023: -External hemorrhoids -2 polyps in the descending colon s/p hemostatic clip x 1 -Left-sided diverticulosis -Pathology revealed tubular adenomas -Repeat colonoscopy in 5 years   Had been following with [facility] with last office visit 01/27/23. Was previously evaluated in 2008 and not placed on the wait list as she was doing too well for transplant.  She has been following closely with transplant regarding management of cirrhosis and HCC in the setting of elevated AFP.  At the time of her office visit she was feeling well and have been working closely with her endocrinologist for controlling her blood sugar and she had lost 8 pounds since January 2024 on Ozempic  2 mg weekly.  She denied any icterus, jaundice, confusion, abdominal swelling, peripheral edema, or tremors.  She has a history of lung nodule and she opted for 1 year surveillance over bronchoscopy and biopsy and Alisha [name] being followed with pulmonology/oncology.  Her case was discussed with Dr. Umberto and she was advised to have an MRI of her abdomen  in August 2024 with CT chest at the same time.   OV 03/09/23. Not having much of an appetite however had been on Ozempic .  Reported 30 pound weight loss.  Usually eating at least 1 meal per day and small snacks throughout the day.  Denied melena or BRBPR.  Did have some random vomiting.  Denied jaundice, pruritus.  GERD controlled PPI twice daily.  Had some intermittent constipation but usually at least 1 bowel movement per day.  Cirrhosis diet and lifestyle modifications reinforced.  Continue PPI.  Continue propranolol  30 mg twice daily.  Encouraged to protein shake daily.  Encouraged ongoing follow-up with [facility] and MRI of the abdomen upcoming in August.   Has had televisit with Alisha Lento, NP and Alisha Alisha [name] in September 2024 and January 2025 in regards to cirrhosis follow-up.  She reported  she was doing well overall without any major issues.  She reportedly Alisha [name] maintained on Ozempic  and lost about 30 pounds total.  Denied melena, BRBPR, ascites, edema, episodes of HE, or jaundice.  aFP noted to be steadily rising prior to September.  In January she also reported be doing well overall without any  major issues to report.  MRI had been scheduled however she had not completed the one scheduled for November.  Weight stable since being on Ozempic , following with endocrinology.  Reports reduced appetite.  No signs or symptoms to suggest decompensation.   Seen by hematology/oncology 3/28.  Reportedly doing well overall although very little appetite and no energy.  Also with chronic cough.  Due to IDA she has required IV iron in the past.  Labs stable as of 3/20 in regards to ferritin and iron saturation however hemoglobin down to 9.9 from 11.2.  Suspect kidney Alisha [name] playing a role, may benefit from EPO treatment should iron levels remain stable.  Advised if no improvement after next lab draw would recommend further evaluation with additional labs and/or initiation of EPO.  Last office visit 12/27/2023. Decreased appetite at baseline. BM frequency varies with bowel movement usually at least once daily but sometimes having to strain.  Only spending about 2 to 3 minutes on the commode at a time.  No unusual chest pain or shortness of breath.  Did report some mild swelling in her abdomen but nothing excessive and no concerns of peripheral edema.  Having good energy for the most part.  Reflux well-controlled with PPI twice daily and remains on her Ozempic  for control of her diabetes.  Patient felt as though maybe she was living alone jaundice however her husband stated he has not noticed any icterus.  Advised INR and AFP to calculate MELD score and continue with the CT ordered by Alisha Alisha [name] and follow-up in the [facility] liver clinic.  Continue PPI daily, reinforced low-sodium diet.  Continue to monitor weights regularly and drink nightly Ensure.  Advise follow-up in 6 months.  CT chest 01/10/2024 with increasing mass in RML. Underwent bronchoscopy in June 2025 which was nondiagnostic. MRI 05/05/2024 with LR-4 lesion, CT with slight decrease in RML soft tissue mass. AFP tumor marker has increased from 1,095 in September 2024 to  4,830 in August 2025.   Admitted to [facility] in August 2025 for 8 days in regards to her hepatocellular carcinoma, cirrhosis, and anasarca with AKI on CKD and excessive peripheral edema.  Admitted to [facility] 10/13 - 06/28/2024 for altered mental status secondary to elevated ammonia levels which improved on lactulose.  Last seen 07/06/2024 virtually with [facility] hepatology.  She was noted to have seen medical oncology, Dr. Letty 06/30/2024.  She reportedly was taking her lactulose at home to manage ammonia levels with residual of 30 mL twice daily, having a couple bowel movements daily but advised a goal Alisha [name] to 3-4 daily to help eliminate ammonia from her bloodstream.  No confusion noted since leaving the hospital.  Also taking Lasix 320 mg tablets daily as well as Aldactone 25 mg daily to manage swelling which she reports as well.  She does report adhering to a low-sodium diet.  She reportedly has follow-up with local oncologist 11/20 for the lung lesion, she states she has CT has been ordered but not yet scheduled.  Denied any melena or BRBPR.  Recently received her flu shot.  She was advised to titrate her lactulose from 3-4 bowel movements daily.  In regards to her  HCC she was recommended to have follow-up MRI in May 2025.  In regards to her lung nodule she was advised that it was preferred for her to stay at [facility] given her imaging and pathology has been completed at [facility] but she did not present for her medical oncology appointment with Dr. Letty and following with local oncologist at [facility] which she prefers.  Advised continue to follow with nephrology for her renal dysfunction.  Advise close follow-up with endocrine, A1c completely controlled currently.  Advised to return in 1 month for MRI and office visit.  Not a candidate for OLT.  They are advised if confusion became a subsequent issue would recommend starting rifaximin.  Today:  US /hepatoma screening: MRI August 2025 with LR-4 lesion.  Repeat November  2025. AFP: 4830 (04/2024) 794 (01/2023) Hep A/B vaccination: Hep A immune, vaccinated for hep B.  EGD:  July 2023 with banding BB: Propranolol  Ascites/peripheral edema: Reportedly improved on her diuretics. Diuretics: Lasix 60 mg daily and Aldactone 25 mg daily Encephalopathy: Dr. Rosamond stopped her lactulose and started her on Xifaxin.  Flu: Fall 2025 Pneumonia: Pneumovax in 2008, Prevnar July 2020  MELD 3.0: 29 (September 2025)  Discussed the use of AI scribe software for clinical note transcription with the patient, who gave verbal consent to proceed.  She experienced significant swelling during her recent hospitalization, which has since decreased. Her weight had increased significantly but has since decreased to around 162 pounds. She Alisha [name] currently taking spironolactone 25 mg once daily and Lasix 60 mg daily, which was reduced from 80 mg due to concerns about kidney function.  Historically, her left leg has been more swollen than the right.  Earlier this month, she was hospitalized due to confusion, which was a one-time event. She describes an episode of disorientation where she found herself standing fully clothed in the bathtub. She Alisha [name] currently taking Xifaxan twice daily, PCP switched her from lactulose to this.   She Alisha [name] following up with oncology for a right middle lobe soft tissue mass and has an upcoming MRI scheduled at [facility] for [facility] surveillance. She Alisha [name] also awaiting a chest CT and follow-up with Dr. Dodson. She has a history of hepatocellular carcinoma with a previous LR-5 mass treated with Y90 radioembolization in December 2022. Recent imaging in March 2025 showed a nonviable segment 7 and LR-4 lesion in segment 6 with rising AFP levels.  Her appetite has improved since hospitalization, and she reports eating better. She avoids adding salt to her food and consumes a mix of fresh and canned vegetables, opting for no added salt options when possible. She drinks one soft drink per day  and Alisha [name] encouraged to drink more water  despite her dislike for it.  She continues to take propranolol , three tablets twice daily, and her heart rate has been around 56 bpm at home. Her blood pressure Alisha [name] stable, and her blood sugars are well-controlled with an A1c of 5, managed with Ozempic . She Alisha [name] also on pantoprazole  for reflux, which Alisha [name] well-controlled.  She had a paracentesis during her hospitalization at [facility], which showed no infection. Her abdomen remains distended since the hospitalization, and she notes that it has not reduced in size. She has not experienced any jaundice and her foot blisters have healed well.  No chest pain, shortness of breath, blood in stool, or black stool. No new jaundice and her appetite Alisha [name] better. No coffee ground emesis or hematemesis.       Wt Readings from Last 6 Encounters:  07/12/24 166 lb (75.3 kg)  06/08/24 169 lb 15.6 oz (77.1 kg)  05/23/24 175 lb (79.4 kg)  01/19/24 165 lb 6.4 oz (75 kg)  12/27/23 167 lb 3.2 oz (75.8 kg)  09/06/23 164 lb 3.2 oz (74.5 kg)    Body mass index Alisha [name] 32.42 kg/m.  Current Outpatient Medications  Medication Sig Dispense Refill   Accu-Chek Softclix Lancets lancets USE TO CHECK BLOOD SUGAR FOUR TIMES DAILY. Dx: E11.65 200 each 2   Continuous Glucose Receiver (FREESTYLE LIBRE 3 READER) DEVI USE AS DIRECTED 2 each 3   Continuous Glucose Sensor (FREESTYLE LIBRE 3 SENSOR) MISC CHANGE SENSOR EVERY 14 DAYS AS DIRECTED 2 each 2   furosemide (LASIX) 80 MG tablet Take 80 mg by mouth daily.     NOVOFINE 32G X 6 MM MISC USE ONE AS DIRECTED TWICE DAILY.     pantoprazole  (PROTONIX ) 40 MG tablet Take 1 tablet (40 mg total) by mouth 2 (two) times daily before a meal. 60 tablet 1   propranolol  (INDERAL ) 10 MG tablet TAKE 3 TABLETS BY MOUTH IN THE MORNING AND TAKE 3 TABLETS BY MOUTH IN THE EVENING 540 tablet 1   Semaglutide , 2 MG/DOSE, (OZEMPIC , 2 MG/DOSE,) 8 MG/3ML SOPN INJECT 2MG  UNDER THE SKIN ONE TIME WEEKLY 9 mL 3   spironolactone  (ALDACTONE) 25 MG tablet Take 25 mg by mouth daily.     XIFAXAN 550 MG TABS tablet Take 550 mg by mouth 2 (two) times daily.     Cholecalciferol (VITAMIN D ) 2000 units tablet Take 2,000 Units by mouth daily. (Patient not taking: Reported on 07/12/2024)     lisinopril (PRINIVIL,ZESTRIL) 10 MG tablet Take 10 mg by mouth daily. (Patient not taking: Reported on 07/12/2024)     No current facility-administered medications for this visit.    Past Medical History:  Diagnosis Date   B12 deficiency 11/03/2016   B12 deficiency 11/03/2016   Barrett's esophagus    Cirrhosis of liver without mention of alcohol     hep B surface antigen and HCV ab negative.pt has not had hepatitis A and B vaccines.U/S on 07/04/13 shows cirrhosis.   Diverticula of colon    pancolonic   Esophageal reflux    Esophageal varices (HCC)    Hiatal hernia    Iron deficiency anemia, unspecified    Other and unspecified hyperlipidemia    Portal hypertensive gastropathy (HCC)    Tubular adenoma    Type II or unspecified type diabetes mellitus without mention of complication, not stated as uncontrolled    Unspecified essential hypertension    Unspecified hemorrhoids without mention of complication     Past Surgical History:  Procedure Laterality Date   ABDOMINAL HYSTERECTOMY     BIOPSY  04/30/2016   Procedure: BIOPSY;  Surgeon: Alisha CHRISTELLA Hollingshead, MD;  [facility];  Service: Endoscopy;;  esophagus   BIOPSY  02/24/2019   Procedure: BIOPSY;  Surgeon: Hollingshead Alisha CHRISTELLA, MD;  [facility];  Service: Endoscopy;;   CESAREAN SECTION     x 2   COLONOSCOPY  03/2006   left sided diverticula, splenic flexure tublar adenoma   COLONOSCOPY  07/2002   villous tubular adenoma in rectum   COLONOSCOPY  09/23/09   external hemorrhoids/scattered pan colonic diverticula otherwise normal. cecal lipoma bx negative, normal TI. Next TCS 09/2014   COLONOSCOPY N/A 11/12/2014   Procedure: COLONOSCOPY;  Surgeon: Alisha CHRISTELLA Hollingshead, MD;   [facility];  Service: Endoscopy;  Laterality: N/A;  1215 -  moved to 12:30 - Ginger to notify pt   COLONOSCOPY N/A 01/07/2017   Rourk: Grade 4 internal hemorrhoids, 5 mm ulcer in the sigmoid colon (benign bx), 7 mm polyp removed from the cecum (tubular adenoma), diverticulosis.next tcs in five years   COLONOSCOPY WITH PROPOFOL  N/A 04/01/2022   Procedure: COLONOSCOPY WITH PROPOFOL ;  Surgeon: Shaaron Alisha HERO, MD;  [facility];  Service: Endoscopy;  Laterality: N/A;  8:00am   ESOPHAGEAL BANDING N/A 12/20/2016   Procedure: ESOPHAGEAL BANDING;  Surgeon: Margo LITTIE Haddock, MD;  [facility];  Service: Endoscopy;  Laterality: N/A;   ESOPHAGEAL BANDING N/A 03/18/2017   Procedure: ESOPHAGEAL BANDING;  Surgeon: Shaaron Alisha HERO, MD;  [facility];  Service: Endoscopy;  Laterality: N/A;   ESOPHAGEAL BANDING  12/17/2017   Procedure: ESOPHAGEAL BANDING;  Surgeon: Shaaron Alisha HERO, MD;  [facility];  Service: Endoscopy;;   ESOPHAGEAL BANDING N/A 07/03/2020   Procedure: ESOPHAGEAL BANDING;  Surgeon: Shaaron Alisha HERO, MD;  [facility];  Service: Endoscopy;  Laterality: N/A;   ESOPHAGEAL BANDING  04/01/2022   Procedure: ESOPHAGEAL BANDING;  Surgeon: Shaaron Alisha HERO, MD;  [facility];  Service: Endoscopy;;   ESOPHAGOGASTRODUODENOSCOPY  08/2006   barretts esophagus, no dyplasia   ESOPHAGOGASTRODUODENOSCOPY  03/2006   barretts esophagus,bx focal atypia c/w low grade dysplasia, SB bx negative for celiac   ESOPHAGOGASTRODUODENOSCOPY  09/23/09   3 columns of grade 2 esophageal varices/hiatal hernia/3-4 cm segment Barrett's esophagus without dysplasia. Next EGD 09/2012   ESOPHAGOGASTRODUODENOSCOPY N/A 11/11/2012   Dr. Shaaron- Barrett;s esophagus, esophageal varices, portal gastopathy. hiatal hernia   ESOPHAGOGASTRODUODENOSCOPY N/A 04/30/2016   Procedure: ESOPHAGOGASTRODUODENOSCOPY (EGD);  Surgeon: Alisha HERO Shaaron, MD;  [facility];  Service:  Endoscopy;  Laterality: N/A;  815   ESOPHAGOGASTRODUODENOSCOPY N/A 12/20/2016   Procedure: ESOPHAGOGASTRODUODENOSCOPY (EGD);  Surgeon: Margo LITTIE Haddock, MD;  [facility];  Service: Endoscopy;  Laterality: N/A;   ESOPHAGOGASTRODUODENOSCOPY N/A 01/07/2017   Procedure: ESOPHAGOGASTRODUODENOSCOPY (EGD);  Surgeon: Shaaron Alisha HERO, MD;  [facility];  Service: Endoscopy;  Laterality: N/A;   ESOPHAGOGASTRODUODENOSCOPY N/A 03/18/2017   Procedure: ESOPHAGOGASTRODUODENOSCOPY (EGD);  Surgeon: Shaaron Alisha HERO, MD;  [facility];  Service: Endoscopy;  Laterality: N/A;  1030    ESOPHAGOGASTRODUODENOSCOPY N/A 12/17/2017   Procedure: ESOPHAGOGASTRODUODENOSCOPY (EGD);  Surgeon: Shaaron Alisha HERO, MD;  [facility];  Service: Endoscopy;  Laterality: N/A;  10:30am   ESOPHAGOGASTRODUODENOSCOPY N/A 02/24/2019   Rourk: No esophageal varices seen, scars noted from prior banding.  5 cm segment of salmon-colored epithelium coming up from the GE junction consistent with Barrett's (bx with no dysplasia).  Portal gastropathy.  Small hiatal hernia.  Recommended repeat EGD in 18 months.   ESOPHAGOGASTRODUODENOSCOPY N/A 07/03/2020   Procedure: ESOPHAGOGASTRODUODENOSCOPY (EGD);  Surgeon: Shaaron Alisha HERO, MD;  [facility];  Service: Endoscopy;  Laterality: N/A;  10:15am   ESOPHAGOGASTRODUODENOSCOPY (EGD) WITH PROPOFOL  N/A 04/01/2022   Procedure: ESOPHAGOGASTRODUODENOSCOPY (EGD) WITH PROPOFOL ;  Surgeon: Shaaron Alisha HERO, MD;  [facility];  Service: Endoscopy;  Laterality: N/A;   HEMORRHOID SURGERY  2008   POLYPECTOMY  04/01/2022   Procedure: POLYPECTOMY;  Surgeon: Shaaron Alisha HERO, MD;  [facility];  Service: Endoscopy;;  ascending and descending   small bowel capsule endoscopy  10/2009   normal    Family History  Problem Relation Age of Onset   Alzheimer's disease Mother    CAD  Father    Diabetes Mellitus II Father    Alzheimer's disease Father    Diabetes  Mellitus II Sister    Colon cancer Sister 76       small, surgical excision; chemo/rad not needed   Cancer - Other Brother    Diabetes Mellitus II Brother    Hypertension Brother    Liver disease Neg Hx    Breast cancer Neg Hx     Allergies as of 07/12/2024 - Review Complete 07/12/2024  Allergen Reaction Noted   Codeine Nausea And Vomiting 04/19/2012    Social History   Socioeconomic History   Marital status: Married    Spouse name: Not on file   Number of children: Not on file   Years of education: Not on file   Highest education level: Not on file  Occupational History   Not on file  Tobacco Use   Smoking status: Never   Smokeless tobacco: Never  Vaping Use   Vaping status: Never Used  Substance and Sexual Activity   Alcohol  use: No   Drug use: No   Sexual activity: Yes  Other Topics Concern   Not on file  Social History Narrative   Not on file   Social Drivers of Health   Financial Resource Strain: Low Risk  (06/26/2024)   Received from [facility]   Overall Financial Resource Strain (CARDIA)    How hard Alisha [name] it for you to pay for the very basics like food, housing, medical care, and heating?: Not very hard  Food Insecurity: No Food Insecurity (06/30/2024)   Received from [facility]   Hunger Vital Sign    Within the past 12 months, you worried that your food would run out before you got the money to buy more.: Never true    Within the past 12 months, the food you bought just didn't last and you didn't have money to get more.: Never true  Transportation Needs: No Transportation Needs (06/30/2024)   Received from [facility] - Transportation    Lack of Transportation (Medical): No    Lack of Transportation (Non-Medical): No  Physical Activity: Inactive (06/26/2024)   Received from [facility]   Exercise Vital Sign    On average, how many days per week do you engage in moderate to strenuous exercise (like a brisk walk)?: 0 days    On  average, how many minutes do you engage in exercise at this level?: 0 min  Stress: Patient Unable To Answer (06/26/2024)   Received from [facility] - Occupational Stress Questionnaire    Do you feel stress - tense, restless, nervous, or anxious, or unable to sleep at night because your mind Alisha [name] troubled all the time - these days?: Patient unable to answer  Social Connections: Socially Integrated (06/26/2024)   Received from [facility]   Social Connection and Isolation Panel    In a typical week, how many times do you talk on the phone with family, friends, or neighbors?: More than three times a week    How often do you get together with friends or relatives?: Three times a week    How often do you attend church or religious services?: More than 4 times per year    Do you belong to any clubs or organizations such as church groups, unions, fraternal or athletic groups, or school groups?: No    How often do  you attend meetings of the clubs or organizations you belong to?: 1 to 4 times per year    Are you married, widowed, divorced, separated, never married, or living with a partner?: Married    Review of Systems   Gen: Denies fever, chills, anorexia. Denies fatigue, weakness, weight loss.  CV: + peripheral edema. Denies chest pain, palpitations, syncope, and claudication. Resp: Denies dyspnea at rest, cough, wheezing, coughing up blood, and pleurisy. GI: See HPI Derm: + dry skin. Denies rash, itching Psych: + recent HE. Denies depression, anxiety, memory loss. No homicidal or suicidal ideation.  Heme: + bruising. Denies bleeding, and enlarged lymph nodes.  Physical Exam   BP (!) 115/51 (BP Location: Right Arm, Patient Position: Sitting, Cuff Size: Normal)   Pulse 96   Temp 97.6 F (36.4 C) (Temporal)   Ht 5' (1.524 m)   Wt 166 lb (75.3 kg)   BMI 32.42 kg/m   General:   Alert and oriented. No distress noted. Pleasant and  cooperative.  Head:  Normocephalic and atraumatic. Eyes:  Conjuctiva clear without scleral icterus. Mouth:  Oral mucosa pink and moist. Good dentition. No lesions. Abdomen:  +BS, soft, non-tender, mildly distended. No rebound or guarding. Rectal: deferred Msk:  Symmetrical without gross deformities. Normal posture. Extremities:  1+ pretibial pitting edema to RLE, 2+ pretibial pitting edema to LLE.  Neurologic:  Alert and oriented x4. No asterixis.  Psych:  Alert and cooperative. Normal mood and affect.  Assessment & Plan  Alisha Alisha [name] Alisha [name] a 73 y.o. female presenting today for 60-month cirrhosis follow-up and follow-up of recent hospitalization for hepatic encephalopathy.     Cirrhosis of liver with hepatic encephalopathy, ascites, portal hypertension, and lower extremity edema Recent hospitalization for confusion and disorientation likely due to hepatic encephalopathy. Ascites with increased abdominal size; paracentesis at [facility] showed no infection. Lower extremity edema improved post-hospitalization, but left leg remains more affected (1+ - 2+), diuretic use limited secondary to CKD. Varices noted in July 2023 s/p banding and maintained on propanol.  Recent admission for HE, currently none today. Dr. Rosamond took her off lactulose and started Xifaxin, we discussed if recurrent HE we would need to add back lactulose. Goal still 3-4 BM daily.  - Refill spironolactone 25 mg daily. - Refill Lasix 60 mg daily. - Continue Xifaxan 550mg  twice daily. - Continue propranolol  - Monitor for worsening ascites and consider paracentesis if shortness of breath or abdominal discomfort increases. - Monitor for signs of hepatic encephalopathy and consider adding lactulose if confusion or fatigue worsens. - Schedule repeat upper endoscopy for varices midsummer next year. - Discussed goal BMs.  - Cirrhosis diet reinforced.   Addendum: Received recent labs from [facility] internal completed on 07/07/2024:  Hemoglobin 8.7, platelets 61, creatinine 2.52, GFR 19, albumin 3.1, bilirubin 3.2, ammonia 116 (her creatinine, GFR, bilirubin, and hemoglobin are improved from 2.78 and a GFR 17, bilirubin of 4.3, hemoglobin 8.1, platelets 61 on her recent discharge from [facility] on 10/15)  Surveillance and follow-up for hepatocellular carcinoma post-Y90 radioembolization and lung soft tissue mass. Recent imaging in May showed LR-TR nonviable segment 7 and LR-4 lesion in segment 6 with rising AFP, concerning for progression of HCC. MRI 05/05/2024 with LR-4 lesion. CT chest 01/10/2024 with increasing mass in RML. Underwent bronchoscopy in June 2025 which was nondiagnostic. Following with medical oncology for further evaluation and management of this along with the lung soft tissue mass. Due to see Dr. Umberto next month for follow up after MRI.  -  Contact [facility] for MRI scheduling and follow-up appointment. [facility] oncology for chest CT scheduling and follow-up with Dr. Dodson on November 20th.  Gastroesophageal reflux disease and history of Barrett's esiophagus Managed with pantoprazole  40 mg twice daily. No breakthrough symptoms noted.  - Continue pantoprazole  as prescribed.      Follow up   Follow up 6 months.   Keep follow ups with oncology and [facility].     Alisha Melia, MSN, FNP-BC, AGACNP-BC [facility]

## 2024-07-12 NOTE — Patient Instructions (Addendum)
 Cirrhosis Lifestyle Recommendations:  High-protein diet from a primarily plant-based diet. Avoid red meat.  No raw or undercooked meat, seafood, or shellfish. Low-fat/cholesterol/carbohydrate diet. Limit sodium to no more than 2000 mg/day including everything that you eat and drink. Recommend at least 30 minutes of aerobic and resistance exercise 3 days/week. Limit Tylenol to 2000 mg daily.  Recommend nightly Ensure and continue to reduce intake of sodas.  I want you to increase your water  intake is much as able.   Cirrhosis medications: Continue Lasix 60 mg once daily.  I sent refill for you today. Continue spironolactone 25 mg once daily.  I also sent refill for you today Continue Xifaxan 550 mg twice daily. For now you may continue to hold the lactulose however if you are not having at least 3-4 bowel movements daily or having recurrent fatigue/drowsiness, or confusion then you need to return to taking lactulose 30 mL twice daily and notify me as well as [facility]. Continue propranolol   Monitor for excessive fluid in the belly and if you start developing increased shortness of breath or pain out of the abdomen please let me know as we we will need to assess for increased amounts of ascites and potentially perform paracentesis.  Please keep your follow-ups with [facility] as well as oncology in regards to the surveillance of your [facility] and the lung tissue mass.  If you have not heard from them by tomorrow please contact [facility] oncology about your CT scan and [facility] regarding your MRI and your follow-up with Dr. Umberto.  At your next follow-up with me we will discuss scheduling your surveillance upper endoscopy to reassess your varices.  Please contact us  immediately if you start developing any coffee-ground emesis or hematemesis  Follow-up in 6 months, sooner if needed  It was a pleasure to see you today. I want to create trusting relationships with patients. If you receive a survey regarding  your visit,  I greatly appreciate you taking time to fill this out on paper or through your MyChart. I value your feedback.  Charmaine Melia, MSN, FNP-BC, AGACNP-BC [facility]

## 2024-07-19 ENCOUNTER — Other Ambulatory Visit: Payer: Self-pay | Admitting: "Endocrinology

## 2024-08-01 DIAGNOSIS — R918 Other nonspecific abnormal finding of lung field: Secondary | ICD-10-CM | POA: Diagnosis not present

## 2024-08-01 DIAGNOSIS — R911 Solitary pulmonary nodule: Secondary | ICD-10-CM | POA: Diagnosis not present

## 2024-08-02 ENCOUNTER — Inpatient Hospital Stay: Attending: Oncology

## 2024-08-02 ENCOUNTER — Other Ambulatory Visit: Payer: Self-pay | Admitting: Gastroenterology

## 2024-08-02 ENCOUNTER — Other Ambulatory Visit (HOSPITAL_COMMUNITY)
Admission: RE | Admit: 2024-08-02 | Discharge: 2024-08-02 | Disposition: A | Discharge status: Home / Self Care | Source: Ambulatory Visit | Attending: Nephrology | Admitting: Nephrology

## 2024-08-02 DIAGNOSIS — K909 Intestinal malabsorption, unspecified: Secondary | ICD-10-CM | POA: Insufficient documentation

## 2024-08-02 DIAGNOSIS — D500 Iron deficiency anemia secondary to blood loss (chronic): Secondary | ICD-10-CM | POA: Insufficient documentation

## 2024-08-02 DIAGNOSIS — K7460 Unspecified cirrhosis of liver: Secondary | ICD-10-CM | POA: Diagnosis not present

## 2024-08-02 DIAGNOSIS — D631 Anemia in chronic kidney disease: Secondary | ICD-10-CM | POA: Insufficient documentation

## 2024-08-02 DIAGNOSIS — R809 Proteinuria, unspecified: Secondary | ICD-10-CM | POA: Insufficient documentation

## 2024-08-02 DIAGNOSIS — E538 Deficiency of other specified B group vitamins: Secondary | ICD-10-CM | POA: Diagnosis not present

## 2024-08-02 DIAGNOSIS — D696 Thrombocytopenia, unspecified: Secondary | ICD-10-CM | POA: Diagnosis not present

## 2024-08-02 DIAGNOSIS — K7682 Hepatic encephalopathy: Secondary | ICD-10-CM | POA: Diagnosis not present

## 2024-08-02 DIAGNOSIS — E8770 Fluid overload, unspecified: Secondary | ICD-10-CM | POA: Diagnosis not present

## 2024-08-02 DIAGNOSIS — L0390 Cellulitis, unspecified: Secondary | ICD-10-CM | POA: Diagnosis not present

## 2024-08-02 DIAGNOSIS — N189 Chronic kidney disease, unspecified: Secondary | ICD-10-CM | POA: Insufficient documentation

## 2024-08-02 LAB — IRON AND TIBC
Iron: 85 ug/dL (ref 28–170)
Saturation Ratios: 53 % — ABNORMAL HIGH (ref 10.4–31.8)
TIBC: 162 ug/dL — ABNORMAL LOW (ref 250–450)
UIBC: 77 ug/dL

## 2024-08-02 LAB — PROTEIN / CREATININE RATIO, URINE
Creatinine, Urine: 61 mg/dL
Protein Creatinine Ratio: 0.12 mg/mg{creat} (ref 0.00–0.15)
Total Protein, Urine: 7 mg/dL

## 2024-08-02 LAB — CBC WITH DIFFERENTIAL/PLATELET
Abs Immature Granulocytes: 0.01 K/uL (ref 0.00–0.07)
Basophils Absolute: 0 K/uL (ref 0.0–0.1)
Basophils Relative: 1 %
Eosinophils Absolute: 0.1 K/uL (ref 0.0–0.5)
Eosinophils Relative: 2 %
HCT: 28 % — ABNORMAL LOW (ref 36.0–46.0)
Hemoglobin: 9.1 g/dL — ABNORMAL LOW (ref 12.0–15.0)
Immature Granulocytes: 0 %
Lymphocytes Relative: 19 %
Lymphs Abs: 0.6 K/uL — ABNORMAL LOW (ref 0.7–4.0)
MCH: 31.8 pg (ref 26.0–34.0)
MCHC: 32.5 g/dL (ref 30.0–36.0)
MCV: 97.9 fL (ref 80.0–100.0)
Monocytes Absolute: 0.3 K/uL (ref 0.1–1.0)
Monocytes Relative: 10 %
Neutro Abs: 2.2 K/uL (ref 1.7–7.7)
Neutrophils Relative %: 68 %
Platelets: 67 K/uL — ABNORMAL LOW (ref 150–400)
RBC: 2.86 MIL/uL — ABNORMAL LOW (ref 3.87–5.11)
RDW: 14.7 % (ref 11.5–15.5)
WBC: 3.2 K/uL — ABNORMAL LOW (ref 4.0–10.5)
nRBC: 0 % (ref 0.0–0.2)

## 2024-08-02 LAB — COMPREHENSIVE METABOLIC PANEL WITH GFR
ALT: 9 U/L (ref 0–44)
AST: 31 U/L (ref 15–41)
Albumin: 3.1 g/dL — ABNORMAL LOW (ref 3.5–5.0)
Alkaline Phosphatase: 70 U/L (ref 38–126)
Anion gap: 11 (ref 5–15)
BUN: 24 mg/dL — ABNORMAL HIGH (ref 8–23)
CO2: 24 mmol/L (ref 22–32)
Calcium: 8.9 mg/dL (ref 8.9–10.3)
Chloride: 103 mmol/L (ref 98–111)
Creatinine, Ser: 2.47 mg/dL — ABNORMAL HIGH (ref 0.44–1.00)
GFR, Estimated: 20 mL/min — ABNORMAL LOW (ref >60)
Glucose, Bld: 165 mg/dL — ABNORMAL HIGH (ref 70–99)
Potassium: 4.2 mmol/L (ref 3.5–5.1)
Sodium: 137 mmol/L (ref 135–145)
Total Bilirubin: 2.3 mg/dL — ABNORMAL HIGH (ref 0.0–1.2)
Total Protein: 6.3 g/dL — ABNORMAL LOW (ref 6.5–8.1)

## 2024-08-02 LAB — CBC
HCT: 28.3 % — ABNORMAL LOW (ref 36.0–46.0)
Hemoglobin: 9.3 g/dL — ABNORMAL LOW (ref 12.0–15.0)
MCH: 31.8 pg (ref 26.0–34.0)
MCHC: 32.9 g/dL (ref 30.0–36.0)
MCV: 96.9 fL (ref 80.0–100.0)
Platelets: 69 K/uL — ABNORMAL LOW (ref 150–400)
RBC: 2.92 MIL/uL — ABNORMAL LOW (ref 3.87–5.11)
RDW: 14.7 % (ref 11.5–15.5)
WBC: 3.3 K/uL — ABNORMAL LOW (ref 4.0–10.5)
nRBC: 0 % (ref 0.0–0.2)

## 2024-08-02 LAB — RENAL FUNCTION PANEL
Albumin: 3.1 g/dL — ABNORMAL LOW (ref 3.5–5.0)
Anion gap: 9 (ref 5–15)
BUN: 25 mg/dL — ABNORMAL HIGH (ref 8–23)
CO2: 25 mmol/L (ref 22–32)
Calcium: 9 mg/dL (ref 8.9–10.3)
Chloride: 104 mmol/L (ref 98–111)
Creatinine, Ser: 2.44 mg/dL — ABNORMAL HIGH (ref 0.44–1.00)
GFR, Estimated: 20 mL/min — ABNORMAL LOW (ref >60)
Glucose, Bld: 179 mg/dL — ABNORMAL HIGH (ref 70–99)
Phosphorus: 2.9 mg/dL (ref 2.5–4.6)
Potassium: 4.5 mmol/L (ref 3.5–5.1)
Sodium: 138 mmol/L (ref 135–145)

## 2024-08-02 LAB — FERRITIN: Ferritin: 434 ng/mL — ABNORMAL HIGH (ref 11–307)

## 2024-08-02 LAB — VITAMIN B12: Vitamin B-12: 838 pg/mL (ref 180–914)

## 2024-08-03 DIAGNOSIS — R911 Solitary pulmonary nodule: Secondary | ICD-10-CM | POA: Diagnosis not present

## 2024-08-03 DIAGNOSIS — C220 Liver cell carcinoma: Secondary | ICD-10-CM | POA: Diagnosis not present

## 2024-08-05 LAB — METHYLMALONIC ACID, SERUM: Methylmalonic Acid, Quantitative: 295 nmol/L (ref 0–378)

## 2024-08-08 ENCOUNTER — Inpatient Hospital Stay: Admitting: Oncology

## 2024-08-08 VITALS — BP 104/49 | HR 70 | Temp 97.4°F | Resp 18 | Ht 60.0 in | Wt 161.0 lb

## 2024-08-08 DIAGNOSIS — D696 Thrombocytopenia, unspecified: Secondary | ICD-10-CM | POA: Diagnosis not present

## 2024-08-08 DIAGNOSIS — D500 Iron deficiency anemia secondary to blood loss (chronic): Secondary | ICD-10-CM | POA: Diagnosis not present

## 2024-08-08 DIAGNOSIS — N189 Chronic kidney disease, unspecified: Secondary | ICD-10-CM | POA: Diagnosis not present

## 2024-08-08 DIAGNOSIS — K7460 Unspecified cirrhosis of liver: Secondary | ICD-10-CM | POA: Diagnosis not present

## 2024-08-08 DIAGNOSIS — E538 Deficiency of other specified B group vitamins: Secondary | ICD-10-CM | POA: Diagnosis not present

## 2024-08-08 DIAGNOSIS — E8770 Fluid overload, unspecified: Secondary | ICD-10-CM | POA: Diagnosis not present

## 2024-08-08 DIAGNOSIS — D631 Anemia in chronic kidney disease: Secondary | ICD-10-CM | POA: Insufficient documentation

## 2024-08-08 DIAGNOSIS — L0390 Cellulitis, unspecified: Secondary | ICD-10-CM | POA: Diagnosis not present

## 2024-08-08 DIAGNOSIS — K909 Intestinal malabsorption, unspecified: Secondary | ICD-10-CM | POA: Diagnosis not present

## 2024-08-08 NOTE — Assessment & Plan Note (Addendum)
 She is followed by Dr. Rachele and actually sees them tomorrow. Previously, we had discussed Retacrit should hemoglobin remained below 10 despite adequate iron stores. We rediscussed the role of Retacrit and we will get this preauthorized for her. Husband states they will touch base with Dr. Rachele tomorrow to see what his thoughts are. Tentatively we will schedule her for her first Retacrit injection in [redacted] weeks along with labs and see her back every 2 weeks with hemoglobin only.  Goal hemoglobin is between 10 and 11.  Hold Retacrit for hemoglobin greater than 11. I will see her back in 3 months with labs including iron panel and B12 levels.

## 2024-08-08 NOTE — Assessment & Plan Note (Addendum)
 B12 level is a 38 with normal MMA. Monitor.

## 2024-08-08 NOTE — Assessment & Plan Note (Addendum)
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  Has required multiple banding procedures.  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 -She is currently not on oral iron supplements. -Has required intermittent IV Feraheme  last given in September 2021. -Most recent labs from 08/02/2024 shows a ferritin of 434, iron saturation 53% and hemoglobin 9.1. -Overall, she feels well.  Denies any significant fatigue or weakness.

## 2024-08-08 NOTE — Addendum Note (Signed)
 Addended by: GEOFM NEST E on: 08/08/2024 02:38 PM   Modules accepted: Orders

## 2024-08-08 NOTE — Progress Notes (Signed)
 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  Has required multiple banding procedures.  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 -She is currently not on oral iron supplements. -Has required intermittent IV Feraheme  last given in September 2021. -Most recent labs from 08/02/2024 shows a ferritin of 434, iron saturation 53% and hemoglobin 9.1. -Overall, she feels well.  Denies any significant fatigue or weakness.   B12 deficiency B12 level is a 38 with normal MMA. Monitor. Thrombocytopenia Etiology secondary to underlying cirrhosis. Platelets are chronically between 50 and 75 Most recent lab from 08/02/2024 shows a platelet count of 67,000. No bleeding per patient.  Continue to monitor. Anemia in chronic kidney disease, unspecified CKD stage She is followed by Dr. Rachele and actually sees them tomorrow. Previously, we had discussed Retacrit should hemoglobin remained below 10 despite adequate iron stores. We rediscussed the role of Retacrit and we will get this preauthorized for her. Husband states they will touch base with Dr. Rachele tomorrow to see what his thoughts are. Tentatively we will schedule her for her first Retacrit injection in [redacted] weeks along with labs and see her back every 2 weeks with hemoglobin only.  Goal hemoglobin is between 10 and 11.  Hold Retacrit for hemoglobin greater than 11. I will see her back in 3 months with labs including iron panel and B12 levels.  Orders Placed This Encounter  Procedures   Ferritin    Standing Status:   Future    Expected Date:   11/08/2024    Expiration Date:   02/06/2025   Vitamin B12    Standing Status:   Future    Expected Date:   11/08/2024    Expiration Date:   02/06/2025    Iron and TIBC ([facility] ONLY)    Standing Status:   Future    Expected Date:   11/08/2024    Expiration Date:   02/06/2025   CBC with Differential    Standing Status:   Future    Expected Date:   11/08/2024    Expiration Date:   02/06/2025   Comprehensive metabolic panel    Standing Status:   Future    Expected Date:   11/08/2024    Expiration Date:   02/06/2025   Methylmalonic acid, serum    Standing Status:   Future    Expected Date:   11/08/2024    Expiration Date:   02/06/2025    INTERVAL HISTORY: Patient returns for follow-up.  Patient was recently hospitalized at [facility] for increased confusion and found acute hepatic encephalopathy which improved with lactulose.  She had rapid improvement of her confusion and was discharged home.  Patient was  hospitalized at [facility] for decompensated liver cirrhosis, volume overload and right foot cellulitis.  She was started on antibiotics for cellulitis, Lasix  for volume overload and lactulose for hepatic encephalopathy.  Lisinopril was held for AKI and she was started on spironolactone  25 mg per hepatology.  Reports overall doing well since her hospitalization.  She denies any bright red blood per rectum, melena or hematochezia.  She is here today with her husband.  Reports her cognition has greatly improved.  She is currently taking lactulose 3 times per day with 2-3 bowel movements daily.  Reports she is usually continent of her stools.  She has follow-up with Dr. Rachele tomorrow.  Overall, she is feeling okay.  We reviewed cbc, cmp, ferritin, b12, iron panel and MMA.  SUMMARY OF HEMATOLOGIC HISTORY: Oncology History   No history exists.     CBC    Component Value Date/Time   WBC 3.2 (L) 08/02/2024 1318   RBC 2.86 (L) 08/02/2024 1318   HGB 9.1 (L) 08/02/2024 1318   HCT 28.0 (L) 08/02/2024 1318   PLT 67 (L) 08/02/2024 1318   MCV 97.9 08/02/2024 1318   MCH 31.8 08/02/2024 1318   MCHC 32.5 08/02/2024 1318   RDW  14.7 08/02/2024 1318   LYMPHSABS 0.6 (L) 08/02/2024 1318   MONOABS 0.3 08/02/2024 1318   EOSABS 0.1 08/02/2024 1318   BASOSABS 0.0 08/02/2024 1318       Latest Ref Rng & Units 08/02/2024    1:18 PM 08/02/2024    1:04 PM 06/02/2024   12:53 PM  CMP  Glucose 70 - 99 mg/dL 834  820  796   BUN 8 - 23 mg/dL 24  25  37   Creatinine 0.44 - 1.00 mg/dL 7.52  7.55  7.34   Sodium 135 - 145 mmol/L 137  138  132   Potassium 3.5 - 5.1 mmol/L 4.2  4.5  3.6   Chloride 98 - 111 mmol/L 103  104  97   CO2 22 - 32 mmol/L 24  25  23    Calcium 8.9 - 10.3 mg/dL 8.9  9.0  7.9   Total Protein 6.5 - 8.1 g/dL 6.3   5.7   Total Bilirubin 0.0 - 1.2 mg/dL 2.3   4.4   Alkaline Phos 38 - 126 U/L 70   73   AST 15 - 41 U/L 31   40   ALT 0 - 44 U/L 9   17      Lab Results  Component Value Date   FERRITIN 434 (H) 08/02/2024   VITAMINB12 838 08/02/2024    Vitals:   08/08/24 1344  BP: (!) 104/49  Pulse: 70  Resp: 18  Temp: (!) 97.4 F (36.3 C)  SpO2: 100%    Review of System:  Review of Systems  Constitutional:  Positive for malaise/fatigue. Negative for weight loss.  Gastrointestinal:  Positive for diarrhea.  Psychiatric/Behavioral:  Positive for memory loss.     Physical Exam: Physical Exam Constitutional:      Appearance: Normal appearance.  HENT:     Head: Normocephalic and atraumatic.  Eyes:     Pupils: Pupils are equal, round, and reactive to light.  Cardiovascular:     Rate and Rhythm: Normal rate and regular rhythm.     Heart sounds: Normal heart sounds. No murmur heard. Pulmonary:     Effort: Pulmonary effort is normal.     Breath sounds: Normal breath sounds. No wheezing.  Abdominal:     General: Bowel sounds are normal. There is no distension.     Palpations: Abdomen is soft.     Tenderness: There is no abdominal tenderness.  Musculoskeletal:        General: Normal range of motion.     Cervical back: Normal range of motion.  Skin:    General: Skin is warm and dry.      Findings: No rash.  Neurological:     Mental Status: She is alert and oriented to person, place, and time.     Gait: Gait is intact.  Psychiatric:  Mood and Affect: Mood and affect normal.        Cognition and Memory: Memory normal.        Judgment: Judgment normal.      I spent 20 minutes dedicated to the care of this patient (face-to-face and non-face-to-face) on the date of the encounter to include what is described in the assessment and plan.,  Delon Hope, NP 08/08/2024 2:23 PM

## 2024-08-08 NOTE — Assessment & Plan Note (Addendum)
 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 Platelets are chronically between 50 and 75 Most recent lab from 08/02/2024 shows a platelet count of 67,000. No bleeding per patient.  Continue to monitor.

## 2024-08-09 DIAGNOSIS — D696 Thrombocytopenia, unspecified: Secondary | ICD-10-CM | POA: Diagnosis not present

## 2024-08-09 DIAGNOSIS — N184 Chronic kidney disease, stage 4 (severe): Secondary | ICD-10-CM | POA: Diagnosis not present

## 2024-08-09 DIAGNOSIS — D6959 Other secondary thrombocytopenia: Secondary | ICD-10-CM | POA: Diagnosis not present

## 2024-08-09 DIAGNOSIS — K7460 Unspecified cirrhosis of liver: Secondary | ICD-10-CM | POA: Diagnosis not present

## 2024-08-22 ENCOUNTER — Inpatient Hospital Stay

## 2024-08-22 ENCOUNTER — Inpatient Hospital Stay: Attending: Oncology

## 2024-08-22 VITALS — BP 115/51 | HR 66 | Temp 96.9°F | Resp 18

## 2024-08-22 DIAGNOSIS — D631 Anemia in chronic kidney disease: Secondary | ICD-10-CM

## 2024-08-22 DIAGNOSIS — I129 Hypertensive chronic kidney disease with stage 1 through stage 4 chronic kidney disease, or unspecified chronic kidney disease: Secondary | ICD-10-CM | POA: Diagnosis present

## 2024-08-22 DIAGNOSIS — N1832 Chronic kidney disease, stage 3b: Secondary | ICD-10-CM | POA: Diagnosis not present

## 2024-08-22 DIAGNOSIS — D500 Iron deficiency anemia secondary to blood loss (chronic): Secondary | ICD-10-CM | POA: Insufficient documentation

## 2024-08-22 DIAGNOSIS — Z79899 Other long term (current) drug therapy: Secondary | ICD-10-CM | POA: Insufficient documentation

## 2024-08-22 LAB — CBC
HCT: 29.4 % — ABNORMAL LOW (ref 36.0–46.0)
Hemoglobin: 9.5 g/dL — ABNORMAL LOW (ref 12.0–15.0)
MCH: 30.8 pg (ref 26.0–34.0)
MCHC: 32.3 g/dL (ref 30.0–36.0)
MCV: 95.5 fL (ref 80.0–100.0)
Platelets: 64 K/uL — ABNORMAL LOW (ref 150–400)
RBC: 3.08 MIL/uL — ABNORMAL LOW (ref 3.87–5.11)
RDW: 14.6 % (ref 11.5–15.5)
WBC: 3.2 K/uL — ABNORMAL LOW (ref 4.0–10.5)
nRBC: 0 % (ref 0.0–0.2)

## 2024-08-22 MED ORDER — EPOETIN ALFA-EPBX 10000 UNIT/ML IJ SOLN
10000.0000 [IU] | Freq: Once | INTRAMUSCULAR | Status: AC
  Administered 2024-08-22: 10000 [IU] via SUBCUTANEOUS
  Filled 2024-08-22: qty 1

## 2024-08-22 NOTE — Progress Notes (Signed)
Patient presents today for Retacrit injection. Hemoglobin reviewed prior to administration. VSS tolerated without incident or complaint. See MAR for details. Patient stable during and after injection. Patient discharged in satisfactory condition with no s/s of distress noted.  

## 2024-08-22 NOTE — Patient Instructions (Signed)

## 2024-08-25 ENCOUNTER — Other Ambulatory Visit: Payer: Self-pay | Admitting: "Endocrinology

## 2024-09-05 ENCOUNTER — Inpatient Hospital Stay

## 2024-09-05 VITALS — BP 115/56 | HR 70 | Temp 96.9°F | Resp 18

## 2024-09-05 DIAGNOSIS — I129 Hypertensive chronic kidney disease with stage 1 through stage 4 chronic kidney disease, or unspecified chronic kidney disease: Secondary | ICD-10-CM | POA: Diagnosis not present

## 2024-09-05 DIAGNOSIS — D631 Anemia in chronic kidney disease: Secondary | ICD-10-CM

## 2024-09-05 LAB — CBC
HCT: 27.5 % — ABNORMAL LOW (ref 36.0–46.0)
Hemoglobin: 9.2 g/dL — ABNORMAL LOW (ref 12.0–15.0)
MCH: 31.2 pg (ref 26.0–34.0)
MCHC: 33.5 g/dL (ref 30.0–36.0)
MCV: 93.2 fL (ref 80.0–100.0)
Platelets: 61 K/uL — ABNORMAL LOW (ref 150–400)
RBC: 2.95 MIL/uL — ABNORMAL LOW (ref 3.87–5.11)
RDW: 14.8 % (ref 11.5–15.5)
WBC: 3.4 K/uL — ABNORMAL LOW (ref 4.0–10.5)
nRBC: 0 % (ref 0.0–0.2)

## 2024-09-05 MED ORDER — EPOETIN ALFA-EPBX 10000 UNIT/ML IJ SOLN
10000.0000 [IU] | Freq: Once | INTRAMUSCULAR | Status: AC
  Administered 2024-09-05: 10000 [IU] via SUBCUTANEOUS
  Filled 2024-09-05: qty 1

## 2024-09-05 NOTE — Patient Instructions (Signed)

## 2024-09-05 NOTE — Progress Notes (Signed)
Patient presents today for Retacrit injection. Hemoglobin reviewed prior to administration. VSS tolerated without incident or complaint. See MAR for details. Patient stable during and after injection. Patient discharged in satisfactory condition with no s/s of distress noted.  

## 2024-09-11 ENCOUNTER — Encounter: Payer: Self-pay | Admitting: *Deleted

## 2024-09-14 ENCOUNTER — Encounter: Payer: Self-pay | Admitting: Oncology

## 2024-09-16 ENCOUNTER — Encounter: Payer: Self-pay | Admitting: Oncology

## 2024-09-19 ENCOUNTER — Inpatient Hospital Stay

## 2024-09-19 ENCOUNTER — Inpatient Hospital Stay: Attending: Oncology

## 2024-09-19 VITALS — BP 124/78 | HR 69 | Temp 97.8°F | Resp 16

## 2024-09-19 DIAGNOSIS — I129 Hypertensive chronic kidney disease with stage 1 through stage 4 chronic kidney disease, or unspecified chronic kidney disease: Secondary | ICD-10-CM | POA: Insufficient documentation

## 2024-09-19 DIAGNOSIS — Z79899 Other long term (current) drug therapy: Secondary | ICD-10-CM | POA: Insufficient documentation

## 2024-09-19 DIAGNOSIS — D631 Anemia in chronic kidney disease: Secondary | ICD-10-CM | POA: Insufficient documentation

## 2024-09-19 DIAGNOSIS — N1832 Chronic kidney disease, stage 3b: Secondary | ICD-10-CM | POA: Diagnosis not present

## 2024-09-19 LAB — CBC
HCT: 29.4 % — ABNORMAL LOW (ref 36.0–46.0)
Hemoglobin: 9.7 g/dL — ABNORMAL LOW (ref 12.0–15.0)
MCH: 30.7 pg (ref 26.0–34.0)
MCHC: 33 g/dL (ref 30.0–36.0)
MCV: 93 fL (ref 80.0–100.0)
Platelets: 60 K/uL — ABNORMAL LOW (ref 150–400)
RBC: 3.16 MIL/uL — ABNORMAL LOW (ref 3.87–5.11)
RDW: 14.9 % (ref 11.5–15.5)
WBC: 3.6 K/uL — ABNORMAL LOW (ref 4.0–10.5)
nRBC: 0 % (ref 0.0–0.2)

## 2024-09-19 MED ORDER — EPOETIN ALFA-EPBX 10000 UNIT/ML IJ SOLN
10000.0000 [IU] | Freq: Once | INTRAMUSCULAR | Status: AC
  Administered 2024-09-19: 10000 [IU] via SUBCUTANEOUS
  Filled 2024-09-19: qty 1

## 2024-09-19 NOTE — Progress Notes (Signed)
Patient presents today for Retacrit injection. Hemoglobin reviewed prior to administration. VSS tolerated without incident or complaint. See MAR for details. Patient stable during and after injection. Patient discharged in satisfactory condition with no s/s of distress noted.  

## 2024-09-19 NOTE — Patient Instructions (Signed)

## 2024-09-26 ENCOUNTER — Ambulatory Visit: Admitting: "Endocrinology

## 2024-09-26 ENCOUNTER — Encounter: Payer: Self-pay | Admitting: "Endocrinology

## 2024-09-26 VITALS — BP 100/50 | HR 64 | Ht 60.0 in | Wt 157.8 lb

## 2024-09-26 DIAGNOSIS — I10 Essential (primary) hypertension: Secondary | ICD-10-CM

## 2024-09-26 DIAGNOSIS — E1169 Type 2 diabetes mellitus with other specified complication: Secondary | ICD-10-CM

## 2024-09-26 DIAGNOSIS — E782 Mixed hyperlipidemia: Secondary | ICD-10-CM | POA: Diagnosis not present

## 2024-09-26 DIAGNOSIS — Z794 Long term (current) use of insulin: Secondary | ICD-10-CM

## 2024-09-26 DIAGNOSIS — Z789 Other specified health status: Secondary | ICD-10-CM

## 2024-09-26 LAB — POCT GLYCOSYLATED HEMOGLOBIN (HGB A1C): HbA1c, POC (controlled diabetic range): 6.3 % (ref 0.0–7.0)

## 2024-09-26 MED ORDER — OZEMPIC (2 MG/DOSE) 8 MG/3ML ~~LOC~~ SOPN: 2.0000 mg | PEN_INJECTOR | SUBCUTANEOUS | 1 refills | Status: AC

## 2024-09-26 NOTE — Progress Notes (Signed)
 "                                                                             09/26/2024, 11:19 AM  Endocrinology follow-up note   Subjective:    Patient ID: Alisha [name], female    DOB: Jun 10, 1951.  Alisha KATHEE Abdelaziz is being seen in follow-up after she was seen in consultation for management of currently uncontrolled symptomatic diabetes requested by  Rosamond Leta KATHEE, MD.   Past Medical History:  Diagnosis Date   B12 deficiency 11/03/2016   B12 deficiency 11/03/2016   Barrett's esophagus    Cirrhosis of liver without mention of alcohol     hep B surface antigen and HCV ab negative.pt has not had hepatitis A and B vaccines.U/S on 07/04/13 shows cirrhosis.   Diverticula of colon    pancolonic   Esophageal reflux    Esophageal varices (HCC)    Hiatal hernia    Iron deficiency anemia, unspecified    Other and unspecified hyperlipidemia    Portal hypertensive gastropathy (HCC)    Tubular adenoma    Type II or unspecified type diabetes mellitus without mention of complication, not stated as uncontrolled    Unspecified essential hypertension    Unspecified hemorrhoids without mention of complication     Past Surgical History:  Procedure Laterality Date   ABDOMINAL HYSTERECTOMY     BIOPSY  04/30/2016   Procedure: BIOPSY;  Surgeon: Lamar CHRISTELLA Hollingshead, MD;  [facility];  Service: Endoscopy;;  esophagus   BIOPSY  02/24/2019   Procedure: BIOPSY;  Surgeon: Hollingshead Lamar CHRISTELLA, MD;  [facility];  Service: Endoscopy;;   CESAREAN SECTION     x 2   COLONOSCOPY  03/2006   left sided diverticula, splenic flexure tublar adenoma   COLONOSCOPY  07/2002   villous tubular adenoma in rectum   COLONOSCOPY  09/23/09   external hemorrhoids/scattered pan colonic diverticula otherwise normal. cecal lipoma bx negative, normal TI. Next TCS 09/2014   COLONOSCOPY N/A 11/12/2014   Procedure: COLONOSCOPY;  Surgeon: Lamar CHRISTELLA Hollingshead, MD;  [facility];  Service: Endoscopy;   Laterality: N/A;  1215 - moved to 12:30 - Ginger to notify pt   COLONOSCOPY N/A 01/07/2017   Rourk: Grade 4 internal hemorrhoids, 5 mm ulcer in the sigmoid colon (benign bx), 7 mm polyp removed from the cecum (tubular adenoma), diverticulosis.next tcs in five years   COLONOSCOPY WITH PROPOFOL  N/A 04/01/2022   Procedure: COLONOSCOPY WITH PROPOFOL ;  Surgeon: Hollingshead Lamar CHRISTELLA, MD;  [facility];  Service: Endoscopy;  Laterality: N/A;  8:00am   ESOPHAGEAL BANDING N/A 12/20/2016   Procedure: ESOPHAGEAL BANDING;  Surgeon: Margo LITTIE Haddock, MD;  [facility];  Service: Endoscopy;  Laterality: N/A;   ESOPHAGEAL BANDING N/A 03/18/2017   Procedure: ESOPHAGEAL BANDING;  Surgeon: Hollingshead Lamar CHRISTELLA, MD;  [facility];  Service: Endoscopy;  Laterality: N/A;   ESOPHAGEAL BANDING  12/17/2017   Procedure: ESOPHAGEAL BANDING;  Surgeon: Hollingshead Lamar CHRISTELLA, MD;  [facility];  Service: Endoscopy;;   ESOPHAGEAL BANDING N/A 07/03/2020   Procedure: ESOPHAGEAL BANDING;  Surgeon: Hollingshead Lamar CHRISTELLA, MD;  [facility];  Service: Endoscopy;  Laterality: N/A;   ESOPHAGEAL BANDING  04/01/2022   Procedure: ESOPHAGEAL BANDING;  Surgeon: Shaaron Lamar HERO, MD;  [facility];  Service: Endoscopy;;   ESOPHAGOGASTRODUODENOSCOPY  08/2006   barretts esophagus, no dyplasia   ESOPHAGOGASTRODUODENOSCOPY  03/2006   barretts esophagus,bx focal atypia c/w low grade dysplasia, SB bx negative for celiac   ESOPHAGOGASTRODUODENOSCOPY  09/23/09   3 columns of grade 2 esophageal varices/hiatal hernia/3-4 cm segment Barrett's esophagus without dysplasia. Next EGD 09/2012   ESOPHAGOGASTRODUODENOSCOPY N/A 11/11/2012   Dr. Shaaron- Barrett;s esophagus, esophageal varices, portal gastopathy. hiatal hernia   ESOPHAGOGASTRODUODENOSCOPY N/A 04/30/2016   Procedure: ESOPHAGOGASTRODUODENOSCOPY (EGD);  Surgeon: Lamar HERO Shaaron, MD;  [facility];  Service: Endoscopy;  Laterality: N/A;  815    ESOPHAGOGASTRODUODENOSCOPY N/A 12/20/2016   Procedure: ESOPHAGOGASTRODUODENOSCOPY (EGD);  Surgeon: Margo LITTIE Haddock, MD;  [facility];  Service: Endoscopy;  Laterality: N/A;   ESOPHAGOGASTRODUODENOSCOPY N/A 01/07/2017   Procedure: ESOPHAGOGASTRODUODENOSCOPY (EGD);  Surgeon: Shaaron Lamar HERO, MD;  [facility];  Service: Endoscopy;  Laterality: N/A;   ESOPHAGOGASTRODUODENOSCOPY N/A 03/18/2017   Procedure: ESOPHAGOGASTRODUODENOSCOPY (EGD);  Surgeon: Shaaron Lamar HERO, MD;  [facility];  Service: Endoscopy;  Laterality: N/A;  1030    ESOPHAGOGASTRODUODENOSCOPY N/A 12/17/2017   Procedure: ESOPHAGOGASTRODUODENOSCOPY (EGD);  Surgeon: Shaaron Lamar HERO, MD;  [facility];  Service: Endoscopy;  Laterality: N/A;  10:30am   ESOPHAGOGASTRODUODENOSCOPY N/A 02/24/2019   Rourk: No esophageal varices seen, scars noted from prior banding.  5 cm segment of salmon-colored epithelium coming up from the GE junction consistent with Barrett's (bx with no dysplasia).  Portal gastropathy.  Small hiatal hernia.  Recommended repeat EGD in 18 months.   ESOPHAGOGASTRODUODENOSCOPY N/A 07/03/2020   Procedure: ESOPHAGOGASTRODUODENOSCOPY (EGD);  Surgeon: Shaaron Lamar HERO, MD;  [facility];  Service: Endoscopy;  Laterality: N/A;  10:15am   ESOPHAGOGASTRODUODENOSCOPY (EGD) WITH PROPOFOL  N/A 04/01/2022   Procedure: ESOPHAGOGASTRODUODENOSCOPY (EGD) WITH PROPOFOL ;  Surgeon: Shaaron Lamar HERO, MD;  [facility];  Service: Endoscopy;  Laterality: N/A;   HEMORRHOID SURGERY  2008   POLYPECTOMY  04/01/2022   Procedure: POLYPECTOMY;  Surgeon: Shaaron Lamar HERO, MD;  [facility];  Service: Endoscopy;;  ascending and descending   small bowel capsule endoscopy  10/2009   normal    Social History   Socioeconomic History   Marital status: Married    Spouse name: Not on file   Number of children: Not on file   Years of education: Not on file   Highest education level: Not on  file  Occupational History   Not on file  Tobacco Use   Smoking status: Never   Smokeless tobacco: Never  Vaping Use   Vaping status: Never Used  Substance and Sexual Activity   Alcohol  use: No   Drug use: No   Sexual activity: Yes  Other Topics Concern   Not on file  Social History Narrative   Not on file   Social Drivers of Health   Tobacco Use: Low Risk (09/26/2024)   Patient History    Smoking Tobacco Use: Never    Smokeless Tobacco Use: Never    Passive Exposure: Not on file  Financial Resource Strain: Low Risk (06/26/2024)   Received from [facility]   Overall Financial Resource Strain (CARDIA)    How hard is it for you to pay for the very basics like food, housing, medical care, and heating?: Not very  hard  Food Insecurity: No Food Insecurity (06/30/2024)   Received from [facility]   Epic    Within the past 12 months, you worried that your food would run out before you got the money to buy more.: Never true    Within the past 12 months, the food you bought just didn't last and you didn't have money to get more.: Never true  Transportation Needs: No Transportation Needs (06/30/2024)   Received from [facility] - Transportation    Lack of Transportation (Medical): No    Lack of Transportation (Non-Medical): No  Physical Activity: Inactive (06/26/2024)   Received from [facility]   Exercise Vital Sign    On average, how many days per week do you engage in moderate to strenuous exercise (like a brisk walk)?: 0 days    On average, how many minutes do you engage in exercise at this level?: 0 min  Stress: Patient Unable To Answer (06/26/2024)   Received from [facility] - Occupational Stress Questionnaire    Do you feel stress - tense, restless, nervous, or anxious, or unable to sleep at night because your mind is troubled all the time - these days?: Patient unable to answer  Social Connections:  Socially Integrated (06/26/2024)   Received from [facility]   Social Connection and Isolation Panel    In a typical week, how many times do you talk on the phone with family, friends, or neighbors?: More than three times a week    How often do you get together with friends or relatives?: Three times a week    How often do you attend church or religious services?: More than 4 times per year    Do you belong to any clubs or organizations such as church groups, unions, fraternal or athletic groups, or school groups?: No    How often do you attend meetings of the clubs or organizations you belong to?: 1 to 4 times per year    Are you married, widowed, divorced, separated, never married, or living with a partner?: Married  Depression (PHQ2-9): Low Risk (08/08/2024)   Depression (PHQ2-9)    PHQ-2 Score: 0  Alcohol  Screen: Not on file  Housing: Low Risk  (05/12/2024)   Received from [facility]   Epic    In the last 12 months, was there a time when you were not able to pay the mortgage or rent on time?: No    In the past 12 months, how many times have you moved where you were living?: 1    At any time in the past 12 months, were you homeless or living in a shelter (including now)?: No  Utilities: Low Risk (06/30/2024)   Received from [facility]   Utilities    Within the past 12 months, have you been unable to get utilities(heat, electricity) when it was really needed?: No  Health Literacy: Not on file (06/26/2024)    Family History  Problem Relation Age of Onset   Alzheimer's disease Mother    CAD Father    Diabetes Mellitus II Father    Alzheimer's disease Father    Diabetes Mellitus II Sister    Colon cancer Sister 59       small, surgical excision; chemo/rad not needed   Cancer - Other Brother    Diabetes Mellitus II Brother    Hypertension Brother  Liver disease Neg Hx    Breast cancer Neg Hx     Outpatient Encounter Medications as of 09/26/2024   Medication Sig   L-Arginine 500 MG CAPS Take 1 capsule by mouth daily.   Accu-Chek Softclix Lancets lancets USE TO CHECK BLOOD SUGAR FOUR TIMES DAILY. Dx: E11.65   ACIDOPHILUS LACTOBACILLUS PO Take 50 capsules by mouth daily.   Cholecalciferol (VITAMIN D ) 2000 units tablet Take 2,000 Units by mouth daily. (Patient not taking: Reported on 09/26/2024)   Continuous Glucose Receiver (FREESTYLE LIBRE 3 READER) DEVI USE AS DIRECTED   Continuous Glucose Sensor (FREESTYLE LIBRE 3 SENSOR) MISC CHANGE SENSOR EVERY 14 DAYS AS DIRECTED   furosemide  (LASIX ) 20 MG tablet Take 3 tablets (60 mg total) by mouth daily.   lactulose (CHRONULAC) 10 GM/15ML solution Take 20 g by mouth.   lisinopril (PRINIVIL,ZESTRIL) 10 MG tablet Take 10 mg by mouth daily. (Patient not taking: Reported on 09/26/2024)   NOVOFINE 32G X 6 MM MISC USE ONE AS DIRECTED TWICE DAILY.   pantoprazole  (PROTONIX ) 40 MG tablet Take 1 tablet (40 mg total) by mouth 2 (two) times daily before a meal.   propranolol  (INDERAL ) 10 MG tablet TAKE 3 TABLETS BY MOUTH IN THE MORNING AND TAKE 3 TABLETS BY MOUTH IN THE EVENING   Semaglutide , 2 MG/DOSE, (OZEMPIC , 2 MG/DOSE,) 8 MG/3ML SOPN Inject 2 mg into the skin once a week.   spironolactone  (ALDACTONE ) 25 MG tablet Take 1 tablet (25 mg total) by mouth daily.   XIFAXAN 550 MG TABS tablet Take 550 mg by mouth 2 (two) times daily.   [DISCONTINUED] Semaglutide , 2 MG/DOSE, (OZEMPIC , 2 MG/DOSE,) 8 MG/3ML SOPN INJECT 2MG  UNDER THE SKIN ONE TIME WEEKLY   No facility-administered encounter medications on file as of 09/26/2024.    ALLERGIES: Allergies  Allergen Reactions   Codeine Nausea And Vomiting    VACCINATION STATUS: Immunization History  Administered Date(s) Administered   Hepatitis B, ADULT 07/21/2017, 12/14/2017, 08/16/2018   INFLUENZA, HIGH DOSE SEASONAL PF 07/04/2017, 06/19/2018, 05/23/2019, 05/23/2019, 07/29/2022   Influenza Split 07/07/2007   Influenza-Unspecified 07/04/2017   Moderna  Sars-Covid-2 Vaccination 11/14/2019, 12/12/2019, 07/17/2020   Pneumococcal Conjugate-13 03/21/2019   Pneumococcal Polysaccharide-23 07/07/2007, 07/25/2019    Diabetes She presents for her follow-up diabetic visit. She has type 2 diabetes mellitus. Onset time: She was diagnosed at approximate age of 39 years. Her disease course has been fluctuating. There are no hypoglycemic associated symptoms. Pertinent negatives for hypoglycemia include no confusion, headaches, pallor or seizures. Pertinent negatives for diabetes include no chest pain, no fatigue, no polydipsia, no polyphagia and no polyuria. There are no hypoglycemic complications. Symptoms are stable. Diabetic complications include nephropathy. Risk factors for coronary artery disease include diabetes mellitus, hypertension, obesity, post-menopausal and family history. She is compliant with treatment most of the time. Her weight is fluctuating minimally. She is following a generally unhealthy diet. When asked about meal planning, she reported none. She has had a previous visit with a dietitian. She participates in exercise intermittently. Her home blood glucose trend is fluctuating minimally. Her breakfast blood glucose range is generally 140-180 mg/dl. Her lunch blood glucose range is generally 140-180 mg/dl. Her dinner blood glucose range is generally 140-180 mg/dl. Her bedtime blood glucose range is generally 140-180 mg/dl. Her overall blood glucose range is 140-180 mg/dl. Meg presents with near target glycemic profile averaging 188 for the most recent 14 days.  However her point-of-care A1c 6.3%.  She did not document any metformin.  Freestyle libre AGP  shows 41% time in range, 57% Level 1 hyperglycemia, no hypoglycemia.  ) An ACE inhibitor/angiotensin II receptor blocker is being taken. Eye exam is current.  Hypertension This is a chronic problem. The current episode started more than 1 year ago. The problem is controlled. Pertinent  negatives include no chest pain, headaches, palpitations or shortness of breath. Risk factors for coronary artery disease include diabetes mellitus, family history, obesity, dyslipidemia, sedentary lifestyle and post-menopausal state. Past treatments include ACE inhibitors. Hypertensive end-organ damage includes kidney disease. Identifiable causes of hypertension include chronic renal disease.     Objective:       09/26/2024   10:45 AM 09/19/2024    1:49 PM 09/05/2024    1:32 PM  Vitals with BMI  Height 5' 0    Weight 157 lbs 13 oz    BMI 30.82    Systolic 100 124 884  Diastolic 50 78 56  Pulse 64 69 70    BP (!) 100/50   Pulse 64   Ht 5' (1.524 m)   Wt 157 lb 12.8 oz (71.6 kg)   BMI 30.82 kg/m   Wt Readings from Last 3 Encounters:  09/26/24 157 lb 12.8 oz (71.6 kg)  08/08/24 161 lb (73 kg)  07/12/24 166 lb (75.3 kg)      CMP ( most recent) CMP     Component Value Date/Time   NA 137 08/02/2024 1318   K 4.2 08/02/2024 1318   CL 103 08/02/2024 1318   CO2 24 08/02/2024 1318   GLUCOSE 165 (H) 08/02/2024 1318   BUN 24 (H) 08/02/2024 1318   BUN 15 01/25/2020 0000   CREATININE 2.47 (H) 08/02/2024 1318   CREATININE 1.62 (H) 03/28/2018 1146   CALCIUM 8.9 08/02/2024 1318   CALCIUM 8.8 10/16/2021 1354   PROT 6.3 (L) 08/02/2024 1318   ALBUMIN 3.1 (L) 08/02/2024 1318   AST 31 08/02/2024 1318   ALT 9 08/02/2024 1318   ALKPHOS 70 08/02/2024 1318   BILITOT 2.3 (H) 08/02/2024 1318   GFRNONAA 20 (L) 08/02/2024 1318   GFRAA 36 (L) 05/29/2020 1416     Diabetic Labs (most recent): Lab Results  Component Value Date   HGBA1C 6.3 09/26/2024   HGBA1C 5.6 05/23/2024   HGBA1C 5.1 01/19/2024   MICROALBUR 30 03/24/2022   MICROALBUR 25.8 (H) 12/23/2021   MICROALBUR 9.6 (H) 06/20/2021     Lipid Panel ( most recent) Lipid Panel     Component Value Date/Time   CHOL 157 10/27/2022 1152   TRIG 98 10/27/2022 1152   HDL 43 10/27/2022 1152   CHOLHDL 3.7 10/27/2022 1152   VLDL 20  10/27/2022 1152   LDLCALC 94 10/27/2022 1152      Lab Results  Component Value Date   TSH 2.448 10/27/2022   TSH 2.577 11/24/2021   TSH 2.531 12/20/2020   TSH 2.46 01/25/2020   FREET4 1.12 10/27/2022   FREET4 0.92 11/24/2021   FREET4 0.97 12/20/2020      Assessment & Plan:   1. Uncontrolled type 2 diabetes mellitus with stage 3 chronic kidney disease (HCC)  - Alisha NOVAK Vandenheuvel has currently uncontrolled symptomatic type 2 DM since 74 years of age.  Clarisse presents with near target glycemic profile averaging 188 for the most recent 14 days.  However her point-of-care A1c 6.3%.  She did not document any metformin.  Freestyle libre AGP shows 41% time in range, 57% Level 1 hyperglycemia, no hypoglycemia.  - I had a long discussion with her  about the progressive nature of diabetes and the pathology behind its complications. -her diabetes is complicated by CKD and she remains at a high risk for more acute and chronic complications which include CAD, CVA, CKD, retinopathy, and neuropathy. These are all discussed in detail with her. -  Patient is encouraged to switch to  unprocessed or minimally processed     complex starch and increased protein intake (animal or plant source), fruits, and vegetables. -  she is advised to stick to a routine mealtimes to eat 3 meals  a day and avoid unnecessary snacks ( to snack only to correct hypoglycemia).   - she acknowledges that there is a room for improvement in her food and drink choices. - Suggestion is made for her to avoid simple carbohydrates  from her diet including Cakes, Sweet Desserts, Ice Cream, Soda (diet and regular), Sweet Tea, Candies, Chips, Cookies, Store Bought Juices, Alcohol  , Artificial Sweeteners,  Coffee Creamer, and Sugar-free Products, Lemonade. This will help patient to have more stable blood glucose profile and potentially avoid unintended weight gain.   - I have approached her with the following individualized plan to  manage  her diabetes and patient agrees:    -She presents with continued engagement and improvement in her glycemic profile.  Based on her presentation with tightly controlled glycemic profile with point-of-care A1c of 6.3%, she will not need insulin  treatment for now.  She is advised to resume and continue Ozempic  2 mg subcutaneously weekly.   She continues to tolerate this medication.  Side effects and precautions discussed with her.   - she is not a candidate for Metformin, due to concurrent renal insufficiency. -She may requalify for low-dose SGLT2 inhibitor if needed on subsequent visits. - She is not to call clinic for hypoglycemia to 70 or hyperglycemia above 200 mg/day weekly average. - Specific targets for  A1c;  LDL, HDL,  and Triglycerides were discussed with the patient.  2) Blood Pressure /Hypertension:  Her blood pressure is controlled to target. she is advised to continue her current medications including lisinopril 10 mg p.o. daily with breakfast .   3) Lipids/Hyperlipidemia:   Review of her recent lipid panel showed uncontrolled LDL at 94.  She reports statin intolerance.      4)  Weight/Diet:  Body mass index is 30.82 kg/m.     she is  a candidate for modest weight loss. I discussed with her the fact that loss of 5 - 10% of her  current body weight will have the most impact on her diabetes management.  Exercise, and detailed carbohydrates information provided  -  detailed on discharge instructions.  5) Chronic Care/Health Maintenance:  -she  is on ACEI  medications and  is encouraged to initiate and continue to follow up with Ophthalmology, Dentist,  Podiatrist at least yearly or according to recommendations, and advised to   stay away from smoking. I have recommended yearly flu vaccine and pneumonia vaccine at least every 5 years; moderate intensity exercise for up to 150 minutes weekly; and  sleep for at least 7 hours a day.  She does not tolerate statins.  She will be  considered for ezetimibe next visit.  6.  Vitamin D  deficiency: She is advised to continue cholecalciferol 2000 units daily.   She recently had normal ABI, will be repeated in 5 years.  - she is  advised to maintain close follow up with [name], MD for primary care needs, as well as her  other providers for optimal and coordinated care.  I spent  26  minutes in the care of the patient today including review of labs from CMP, Lipids, Thyroid  Function, Hematology (current and previous including abstractions from other facilities); face-to-face time discussing  her blood glucose readings/logs, discussing hypoglycemia and hyperglycemia episodes and symptoms, medications doses, her options of short and long term treatment based on the latest standards of care / guidelines;  discussion about incorporating lifestyle medicine;  and documenting the encounter. Risk reduction counseling performed per USPSTF guidelines to reduce cardiovascular risk factors.     Please refer to Patient Instructions for Blood Glucose Monitoring and Insulin /Medications Dosing Guide  in media tab for additional information. Please  also refer to  Patient Self Inventory in the Media  tab for reviewed elements of pertinent patient history.  Alisha NOVAK Hara participated in the discussions, expressed understanding, and voiced agreement with the above plans.  All questions were answered to her satisfaction. she is encouraged to contact clinic should she have any questions or concerns prior to her return visit.  Dear Patient: Feel free to review your progress notes.  If you are reviewing this progress note and have questions about the meaning of /or medical terms being used, please make a note and address it at your next follow-up appointment.  Medical notes are meant to be a communication tool between medical professionals and require medical terms to be used for efficiency and insurance approval.   Follow up plan: - Return  in about 6 months (around 03/26/2025) for Bring Meter/CGM Device/Logs- A1c in Office.  Ranny Earl, MD [facility] [address] [place], [place] [address] Phone: [phone]  Fax: [phone]    09/26/2024, 11:20 AM  This note was partially dictated with voice recognition software. Similar sounding words can be transcribed inadequately or may not  be corrected upon review.  "

## 2024-10-03 ENCOUNTER — Inpatient Hospital Stay

## 2024-10-03 VITALS — BP 113/71 | HR 71

## 2024-10-03 DIAGNOSIS — I129 Hypertensive chronic kidney disease with stage 1 through stage 4 chronic kidney disease, or unspecified chronic kidney disease: Secondary | ICD-10-CM | POA: Diagnosis not present

## 2024-10-03 DIAGNOSIS — D631 Anemia in chronic kidney disease: Secondary | ICD-10-CM

## 2024-10-03 LAB — CBC
HCT: 28.9 % — ABNORMAL LOW (ref 36.0–46.0)
Hemoglobin: 9.4 g/dL — ABNORMAL LOW (ref 12.0–15.0)
MCH: 30.5 pg (ref 26.0–34.0)
MCHC: 32.5 g/dL (ref 30.0–36.0)
MCV: 93.8 fL (ref 80.0–100.0)
Platelets: 68 K/uL — ABNORMAL LOW (ref 150–400)
RBC: 3.08 MIL/uL — ABNORMAL LOW (ref 3.87–5.11)
RDW: 14.8 % (ref 11.5–15.5)
WBC: 3.8 K/uL — ABNORMAL LOW (ref 4.0–10.5)
nRBC: 0 % (ref 0.0–0.2)

## 2024-10-03 MED ORDER — EPOETIN ALFA-EPBX 10000 UNIT/ML IJ SOLN
10000.0000 [IU] | Freq: Once | INTRAMUSCULAR | Status: AC
  Administered 2024-10-03: 10000 [IU] via SUBCUTANEOUS
  Filled 2024-10-03: qty 1

## 2024-10-03 NOTE — Patient Instructions (Signed)

## 2024-10-03 NOTE — Progress Notes (Signed)
Patient presents today for Retacrit injection. Hemoglobin reviewed prior to administration. VSS tolerated without incident or complaint. See MAR for details. Patient stable during and after injection. Patient discharged in satisfactory condition with no s/s of distress noted.  

## 2024-10-06 ENCOUNTER — Other Ambulatory Visit: Payer: Self-pay | Admitting: "Endocrinology

## 2024-10-31 ENCOUNTER — Inpatient Hospital Stay: Attending: Oncology | Admitting: Oncology

## 2024-10-31 ENCOUNTER — Inpatient Hospital Stay

## 2025-01-10 ENCOUNTER — Ambulatory Visit: Admitting: Gastroenterology

## 2025-03-28 ENCOUNTER — Ambulatory Visit: Admitting: "Endocrinology
